# Patient Record
Sex: Male | Born: 1965 | Race: White | Hispanic: No | Marital: Single | Smoking: Former smoker
Health system: Southern US, Community
[De-identification: ages and names within clinical notes are randomized; demographics above are authoritative.]

## PROBLEM LIST (undated history)

## (undated) ENCOUNTER — Emergency Department (HOSPITAL_COMMUNITY): Discharge status: Absent without leave | Payer: Medicare PPO

## (undated) DIAGNOSIS — E119 Type 2 diabetes mellitus without complications: Secondary | ICD-10-CM

## (undated) DIAGNOSIS — G4730 Sleep apnea, unspecified: Secondary | ICD-10-CM

## (undated) DIAGNOSIS — I4891 Unspecified atrial fibrillation: Secondary | ICD-10-CM

## (undated) DIAGNOSIS — N189 Chronic kidney disease, unspecified: Secondary | ICD-10-CM

## (undated) DIAGNOSIS — E785 Hyperlipidemia, unspecified: Secondary | ICD-10-CM

## (undated) DIAGNOSIS — N186 End stage renal disease: Secondary | ICD-10-CM

## (undated) DIAGNOSIS — I255 Ischemic cardiomyopathy: Secondary | ICD-10-CM

## (undated) DIAGNOSIS — I739 Peripheral vascular disease, unspecified: Secondary | ICD-10-CM

## (undated) DIAGNOSIS — I509 Heart failure, unspecified: Secondary | ICD-10-CM

## (undated) DIAGNOSIS — I2510 Atherosclerotic heart disease of native coronary artery without angina pectoris: Secondary | ICD-10-CM

## (undated) DIAGNOSIS — F419 Anxiety disorder, unspecified: Secondary | ICD-10-CM

## (undated) DIAGNOSIS — I10 Essential (primary) hypertension: Secondary | ICD-10-CM

## (undated) DIAGNOSIS — I499 Cardiac arrhythmia, unspecified: Secondary | ICD-10-CM

## (undated) HISTORY — DX: Anxiety disorder, unspecified: F41.9

## (undated) HISTORY — DX: Cardiac arrhythmia, unspecified: I49.9

## (undated) HISTORY — DX: Hyperlipidemia, unspecified: E78.5

## (undated) HISTORY — DX: Type 2 diabetes mellitus without complications: E11.9

## (undated) HISTORY — DX: Essential (primary) hypertension: I10

## (undated) HISTORY — DX: Chronic kidney disease, unspecified: N18.9

## (undated) HISTORY — DX: Heart failure, unspecified: I50.9

## (undated) HISTORY — PX: AMPUTATION TOE: SHX6595

## (undated) HISTORY — DX: Atherosclerotic heart disease of native coronary artery without angina pectoris: I25.10

---

## 2004-02-21 ENCOUNTER — Other Ambulatory Visit: Payer: Self-pay

## 2005-09-28 ENCOUNTER — Other Ambulatory Visit: Payer: Self-pay

## 2005-09-28 ENCOUNTER — Emergency Department: Payer: Self-pay | Admitting: General Practice

## 2005-11-20 ENCOUNTER — Encounter: Payer: Self-pay | Admitting: Orthopedic Surgery

## 2005-12-20 ENCOUNTER — Encounter: Payer: Self-pay | Admitting: Orthopedic Surgery

## 2006-12-23 ENCOUNTER — Emergency Department: Payer: Self-pay | Admitting: General Practice

## 2007-03-22 ENCOUNTER — Emergency Department: Payer: Self-pay | Admitting: Emergency Medicine

## 2007-12-01 ENCOUNTER — Observation Stay: Payer: Self-pay | Admitting: Internal Medicine

## 2007-12-01 ENCOUNTER — Other Ambulatory Visit: Payer: Self-pay

## 2008-04-19 ENCOUNTER — Ambulatory Visit: Payer: Self-pay | Admitting: Urology

## 2009-03-10 ENCOUNTER — Other Ambulatory Visit: Payer: Self-pay | Admitting: Emergency Medicine

## 2010-07-22 HISTORY — PX: CARDIAC CATHETERIZATION: SHX172

## 2011-11-07 DIAGNOSIS — I517 Cardiomegaly: Secondary | ICD-10-CM | POA: Insufficient documentation

## 2011-11-07 DIAGNOSIS — E119 Type 2 diabetes mellitus without complications: Secondary | ICD-10-CM | POA: Insufficient documentation

## 2011-11-07 DIAGNOSIS — Z955 Presence of coronary angioplasty implant and graft: Secondary | ICD-10-CM | POA: Insufficient documentation

## 2011-11-07 DIAGNOSIS — N186 End stage renal disease: Secondary | ICD-10-CM | POA: Insufficient documentation

## 2011-11-07 DIAGNOSIS — I10 Essential (primary) hypertension: Secondary | ICD-10-CM | POA: Insufficient documentation

## 2011-11-07 DIAGNOSIS — I2510 Atherosclerotic heart disease of native coronary artery without angina pectoris: Secondary | ICD-10-CM | POA: Insufficient documentation

## 2011-11-07 DIAGNOSIS — E785 Hyperlipidemia, unspecified: Secondary | ICD-10-CM | POA: Insufficient documentation

## 2012-01-03 DIAGNOSIS — L299 Pruritus, unspecified: Secondary | ICD-10-CM | POA: Insufficient documentation

## 2012-01-03 DIAGNOSIS — Q245 Malformation of coronary vessels: Secondary | ICD-10-CM | POA: Insufficient documentation

## 2012-01-03 DIAGNOSIS — D696 Thrombocytopenia, unspecified: Secondary | ICD-10-CM | POA: Insufficient documentation

## 2012-01-03 DIAGNOSIS — N2581 Secondary hyperparathyroidism of renal origin: Secondary | ICD-10-CM | POA: Insufficient documentation

## 2012-01-03 DIAGNOSIS — E119 Type 2 diabetes mellitus without complications: Secondary | ICD-10-CM | POA: Insufficient documentation

## 2012-01-03 DIAGNOSIS — R52 Pain, unspecified: Secondary | ICD-10-CM | POA: Insufficient documentation

## 2012-01-03 DIAGNOSIS — D689 Coagulation defect, unspecified: Secondary | ICD-10-CM | POA: Insufficient documentation

## 2012-01-03 DIAGNOSIS — N189 Chronic kidney disease, unspecified: Secondary | ICD-10-CM | POA: Insufficient documentation

## 2012-01-03 DIAGNOSIS — R079 Chest pain, unspecified: Secondary | ICD-10-CM | POA: Insufficient documentation

## 2012-01-09 DIAGNOSIS — D509 Iron deficiency anemia, unspecified: Secondary | ICD-10-CM | POA: Insufficient documentation

## 2012-01-09 DIAGNOSIS — G479 Sleep disorder, unspecified: Secondary | ICD-10-CM | POA: Insufficient documentation

## 2012-01-09 DIAGNOSIS — E8809 Other disorders of plasma-protein metabolism, not elsewhere classified: Secondary | ICD-10-CM | POA: Insufficient documentation

## 2012-01-09 DIAGNOSIS — Z23 Encounter for immunization: Secondary | ICD-10-CM | POA: Insufficient documentation

## 2012-01-09 DIAGNOSIS — E875 Hyperkalemia: Secondary | ICD-10-CM | POA: Insufficient documentation

## 2013-09-21 DIAGNOSIS — E440 Moderate protein-calorie malnutrition: Secondary | ICD-10-CM | POA: Insufficient documentation

## 2014-06-25 DIAGNOSIS — I209 Angina pectoris, unspecified: Secondary | ICD-10-CM | POA: Insufficient documentation

## 2014-08-15 DIAGNOSIS — E8770 Fluid overload, unspecified: Secondary | ICD-10-CM | POA: Insufficient documentation

## 2014-10-17 ENCOUNTER — Other Ambulatory Visit (HOSPITAL_COMMUNITY): Payer: Self-pay | Admitting: Radiology

## 2014-10-17 DIAGNOSIS — G4730 Sleep apnea, unspecified: Secondary | ICD-10-CM

## 2014-10-17 DIAGNOSIS — R0683 Snoring: Secondary | ICD-10-CM

## 2015-03-22 ENCOUNTER — Other Ambulatory Visit (HOSPITAL_COMMUNITY): Payer: Self-pay | Admitting: Respiratory Therapy

## 2015-04-07 ENCOUNTER — Other Ambulatory Visit (HOSPITAL_COMMUNITY): Payer: Self-pay | Admitting: Respiratory Therapy

## 2015-06-20 ENCOUNTER — Ambulatory Visit: Payer: Medicare Other | Attending: Physician Assistant | Admitting: Sleep Medicine

## 2015-06-20 DIAGNOSIS — G4730 Sleep apnea, unspecified: Secondary | ICD-10-CM | POA: Diagnosis present

## 2015-06-20 DIAGNOSIS — G4733 Obstructive sleep apnea (adult) (pediatric): Secondary | ICD-10-CM | POA: Insufficient documentation

## 2015-06-20 DIAGNOSIS — R0683 Snoring: Secondary | ICD-10-CM

## 2015-06-24 ENCOUNTER — Encounter: Payer: Self-pay | Admitting: Neurology

## 2015-06-24 NOTE — Sleep Study (Signed)
  Moultrie A. Merlene Laughter, MD     [email or web address]             NOCTURNAL POLYSOMNOGRAPHY   LOCATION: [facility]   CLINICAL INFORMATION Sleep Study Type: Split Night CPAP Indication for sleep study: Snoring, Witnessed Apneas Epworth Sleepiness Score: 6 SLEEP STUDY TECHNIQUE As per the AASM Manual for the Scoring of Sleep and Associated Events v2.3 (April 2016) with a hypopnea requiring 4% desaturations. The channels recorded and monitored were frontal, central and occipital EEG, electrooculogram (EOG), submentalis EMG (chin), nasal and oral airflow, thoracic and abdominal wall motion, anterior tibialis EMG, snore microphone, electrocardiogram, and pulse oximetry. Continuous positive airway pressure (CPAP) was initiated when the patient met split night criteria and was titrated according to treat sleep-disordered breathing. MEDICATIONS Medications taken by the patient : N/A Medications administered by patient during sleep study : No sleep medicine administered.  Prior to Admission medications   Not on File    RESPIRATORY PARAMETERS Diagnostic Total AHI (/hr): 35.5 RDI (/hr): 35.5 OA Index (/hr): 4.4 CA Index (/hr): 0.5 REM AHI (/hr): N/A NREM AHI (/hr): 35.5 Supine AHI (/hr): 105.6 Non-supine AHI (/hr): 0.00 Min O2 Sat (%): 81.00 Mean O2 (%): 88.88 Time below 88% (min): 42.5     Titration Optimal Pressure (cm): 10 AHI at Optimal Pressure (/hr): 1.7 Min O2 at Optimal Pressure (%): 38.00 Supine % at Optimal (%): N/A Sleep % at Optimal (%): N/A     SLEEP ARCHITECTURE The recording time for the entire night was 429.9 minutes. During a baseline period of 145.6 minutes, the patient slept for 123.5 minutes in REM and nonREM, yielding a sleep efficiency of 84.8%. Sleep onset after lights out was 14.3 minutes with a REM latency of N/A minutes. The patient spent 6.88% of the night in stage N1 sleep, 63.97% in stage N2 sleep, 29.15% in stage N3 and 0.00% in REM. During the  titration period of 271.0 minutes, the patient slept for 166.0 minutes in REM and nonREM, yielding a sleep efficiency of 61.3%. Sleep onset after CPAP initiation was 30.4 minutes with a REM latency of 169.5 minutes. The patient spent 9.33% of the night in stage N1 sleep, 51.52% in stage N2 sleep, 27.70% in stage N3 and 11.44% in REM. CARDIAC DATA The 2 lead EKG demonstrated sinus rhythm. The mean heart rate was 73.74 beats per minute. Other EKG findings include: None. LEG MOVEMENT DATA The total Periodic Limb Movements of Sleep (PLMS) were 42. The PLMS index was 8.70.   IMPRESSIONS - Moderate to severe obstructive sleep apnea occurred during the diagnostic portion of the study (AHI = 35.5/hour). An optimal CPAP pressure 10. - Mild periodic limb movements of sleep occurred during the study.  Ralph Metz, MD Diplomate, American Board of Sleep Medicine.

## 2016-03-19 ENCOUNTER — Ambulatory Visit (INDEPENDENT_AMBULATORY_CARE_PROVIDER_SITE_OTHER): Payer: Medicare Other | Admitting: Podiatry

## 2016-03-19 ENCOUNTER — Encounter: Payer: Self-pay | Admitting: Podiatry

## 2016-03-19 ENCOUNTER — Ambulatory Visit (INDEPENDENT_AMBULATORY_CARE_PROVIDER_SITE_OTHER): Payer: Medicare Other

## 2016-03-19 ENCOUNTER — Telehealth: Payer: Self-pay | Admitting: *Deleted

## 2016-03-19 DIAGNOSIS — R7309 Other abnormal glucose: Secondary | ICD-10-CM | POA: Diagnosis not present

## 2016-03-19 DIAGNOSIS — R52 Pain, unspecified: Secondary | ICD-10-CM

## 2016-03-19 DIAGNOSIS — I96 Gangrene, not elsewhere classified: Secondary | ICD-10-CM | POA: Diagnosis not present

## 2016-03-19 DIAGNOSIS — L03115 Cellulitis of right lower limb: Secondary | ICD-10-CM

## 2016-03-19 MED ORDER — CLINDAMYCIN HCL 300 MG PO CAPS
300.0000 mg | ORAL_CAPSULE | Freq: Three times a day (TID) | ORAL | 2 refills | Status: DC
Start: 2016-03-19 — End: 2016-04-26

## 2016-03-19 MED ORDER — CIPROFLOXACIN HCL 500 MG PO TABS
500.0000 mg | ORAL_TABLET | Freq: Two times a day (BID) | ORAL | 0 refills | Status: DC
Start: 2016-03-19 — End: 2016-04-01

## 2016-03-19 NOTE — Patient Instructions (Signed)

## 2016-03-19 NOTE — Telephone Encounter (Addendum)
-----   Message from Trula Slade, DPM sent at 03/19/2016 12:13 PM EDT ----- Can he get arterial studies this week. 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  Orders faxed [facility] for Stat arterial dopplers. 03/20/2016-Debra - [facility] states pt is scheduled for 03/21/2016 at 0800, to arrive 0745, but can't contact pt. I spoke with pt he states he is at dialysis and keeps the volume down, but saw the message and called and confirmed he would make the appt.

## 2016-03-19 NOTE — Progress Notes (Signed)
Subjective:    Patient ID: Ralph Peterson, male    DOB: [birth year], 50 y.o.   MRN: [id number]  HPI   50 year old male presents the office today for concerns of a possible "diabetic ulcer" on his right third toe which is been ongoing for the last 3 weeks. He states that he started noticing redness to the tip of his toe and aligned 4 minus toe and over the last week or so the toe started to become black and discolored. He has noticed a small amount of redness to the right foot as well. He has not had any treatment for this. He is not currently on antibiotics. He is current dialysis 3 times a week and he is diabetic but he states he does not take medication for this. His last A1c was 6.8. He denies any claudication symptoms. Denies any pain at this time. He denies any systemic complaints as fevers, chills, nausea, vomiting. No calf pain, chest pain, shortness of breath.   Review of Systems  All other systems reviewed and are negative.      Objective:   Physical Exam General: AAO x3, NAD  Dermatological: There is gangrenous changes of the distal aspect of the right third toe as well as a small amount of macerated tissue just proximal to this on the PIPJ. There is erythema to the toe to the level of the MPJ as well. There is faint erythema to the dorsal aspect of the foot and a mild increase in warmth on the MPJ of the second however there is no ascending synovitis. I'm unable to express any drainage or pus today. There is no areas of fluctuance or crepitus. There is a mild malodor to the toe. There is no other open lesions. There are small hyperkeratotic lesions to the distal aspect of the second and fourth toe on the right foot.  Vascular: There appears to be decreased pulses on the right side of both the DP and PT compared to the left. However there is mild edema to the right foot which may limit evaluation. There is no pain with calf compression, swelling, warmth, erythema.   Neruologic:  Decreased sensation with Derrel Nip monofilament  Musculoskeletal: Hammertoes are present. There is no areas of tenderness bilateral.  Gait: Unassisted, Nonantalgic.      Assessment & Plan:  Gangrene right third digit, cellulitis -Treatment options discussed including all alternatives, risks, and complications -Etiology of symptoms were discussed -X-rays were obtained and reviewed with the patient. No definitive evidence of acute ulcer myelitis and there is no soft tissue emphysema. -At this time given the nature of the toe I recommended toe amputation to remove the gangrenous toe as well as to control infection. I discussed with him the potential that he may require further surgery further amputation but we will start with a toe amputation. He wishes to go ahead and proceed with this. He would like to hold off until next week however. I am going to start him on clindamycin and ciprofloxacin. Discussed with him that if he has any signs or symptoms of any worsening infection is to go immediately to the emergency room and he verbally understood this. Will order arterial studies as well. Ordered blood work today. -The incision placement as well as the postoperative course was discussed with the patient. I discussed risks of the surgery which include, but not limited to, infection, bleeding, pain, swelling, need for further surgery, delayed or nonhealing, painful or ugly scar, numbness or sensation changes,  over/under correction, recurrence, transfer lesions, further deformity, hardware failure, DVT/PE, loss of toe/foot. Patient understands these risks and wishes to proceed with surgery. The surgical consent was reviewed with the patient all 3 pages were signed. No promises or guarantees were given to the outcome of the procedure. All questions were answered to the best of my ability. Before the surgery the patient was encouraged to call the office if there is any further questions. The surgery will be  performed at the [facility] on an outpatient basis. Discussing to myself or Dr. Amalia Hailey will be performing the surgery. He agrees to this.  Celesta Gentile, DPM

## 2016-03-20 ENCOUNTER — Telehealth: Payer: Self-pay | Admitting: *Deleted

## 2016-03-20 DIAGNOSIS — I96 Gangrene, not elsewhere classified: Secondary | ICD-10-CM | POA: Insufficient documentation

## 2016-03-20 LAB — CBC WITH DIFFERENTIAL/PLATELET
BASOS ABS: 0.1 10*3/uL (ref 0.0–0.2)
BASOS: 1 %
EOS (ABSOLUTE): 0.2 10*3/uL (ref 0.0–0.4)
Eos: 2 %
HEMOGLOBIN: 9.9 g/dL — AB (ref 12.6–17.7)
Hematocrit: 29.8 % — ABNORMAL LOW (ref 37.5–51.0)
IMMATURE GRANS (ABS): 0.1 10*3/uL (ref 0.0–0.1)
Immature Granulocytes: 1 %
LYMPHS ABS: 2 10*3/uL (ref 0.7–3.1)
LYMPHS: 18 %
MCH: 30.6 pg (ref 26.6–33.0)
MCHC: 33.2 g/dL (ref 31.5–35.7)
MCV: 92 fL (ref 79–97)
MONOCYTES: 6 %
Monocytes Absolute: 0.7 10*3/uL (ref 0.1–0.9)
NEUTROS ABS: 8 10*3/uL — AB (ref 1.4–7.0)
Neutrophils: 72 %
Platelets: 257 10*3/uL (ref 150–379)
RBC: 3.24 x10E6/uL — ABNORMAL LOW (ref 4.14–5.80)
RDW: 13.9 % (ref 12.3–15.4)
WBC: 11 10*3/uL — ABNORMAL HIGH (ref 3.4–10.8)

## 2016-03-20 LAB — COMPREHENSIVE METABOLIC PANEL
A/G RATIO: 1.5 (ref 1.2–2.2)
ALBUMIN: 4.1 g/dL (ref 3.5–5.5)
ALK PHOS: 115 IU/L (ref 39–117)
ALT: 5 IU/L (ref 0–44)
AST: 7 IU/L (ref 0–40)
BILIRUBIN TOTAL: 0.4 mg/dL (ref 0.0–1.2)
BUN / CREAT RATIO: 4 — AB (ref 9–20)
BUN: 33 mg/dL — ABNORMAL HIGH (ref 6–24)
CHLORIDE: 91 mmol/L — AB (ref 96–106)
CO2: 29 mmol/L (ref 18–29)
Calcium: 10.1 mg/dL (ref 8.7–10.2)
Creatinine, Ser: 8.35 mg/dL — ABNORMAL HIGH (ref 0.76–1.27)
GFR calc non Af Amer: 7 mL/min/{1.73_m2} — ABNORMAL LOW (ref >59)
GFR, EST AFRICAN AMERICAN: 8 mL/min/{1.73_m2} — AB (ref >59)
Globulin, Total: 2.7 g/dL (ref 1.5–4.5)
Glucose: 223 mg/dL — ABNORMAL HIGH (ref 65–99)
POTASSIUM: 4.5 mmol/L (ref 3.5–5.2)
SODIUM: 139 mmol/L (ref 134–144)
TOTAL PROTEIN: 6.8 g/dL (ref 6.0–8.5)

## 2016-03-20 LAB — C-REACTIVE PROTEIN: CRP: 108.2 mg/L — ABNORMAL HIGH (ref 0.0–4.9)

## 2016-03-20 LAB — HEMOGLOBIN A1C
Est. average glucose Bld gHb Est-mCnc: 194 mg/dL
Hgb A1c MFr Bld: 8.4 % — ABNORMAL HIGH (ref 4.8–5.6)

## 2016-03-20 LAB — SEDIMENTATION RATE: SED RATE: 85 mm/h — AB (ref 0–15)

## 2016-03-20 NOTE — Telephone Encounter (Signed)
I left patient a message that his appointment is on 03/28/2016.

## 2016-03-21 ENCOUNTER — Telehealth: Payer: Self-pay | Admitting: *Deleted

## 2016-03-21 NOTE — Telephone Encounter (Signed)
"  I was returning your call from earlier.  He said he was going to try and get me in on Tuesday and you said it was scheduled for the 7th which is Thursday.  I just want to make sure I have the right information.  Call when you get a chance to let me know if it is Tuesday or Thursday.  Thank you have a good day."  I'm returning your call.  You are scheduled for Thursday.  "That works out perfect because I have another procedure on Tuesday.  They're going to remove some plaque so I can heal properly.  What time will it be."   Someone from the surgical center will call with your arrival time.

## 2016-03-22 NOTE — Telephone Encounter (Addendum)
-----   Message from Trula Slade, DPM sent at 03/20/2016  7:57 PM EDT ----- Tivis Ringer- Can you send this guys lab work to his PCP  Dr. Amalia Hailey- this is lab work on the guy with the gangrenous toe if you do the surgery. 03/22/2016-[facility] [phone], fax [phone], pt sees Nurse Robbins. Faxed 03/19/2016 labs.

## 2016-03-26 ENCOUNTER — Other Ambulatory Visit: Payer: Self-pay | Admitting: Vascular Surgery

## 2016-03-26 ENCOUNTER — Encounter
Admission: RE | Disposition: A | Discharge status: Home / Self Care | Payer: Self-pay | Source: Ambulatory Visit | Attending: Vascular Surgery

## 2016-03-26 ENCOUNTER — Ambulatory Visit
Admission: RE | Admit: 2016-03-26 | Discharge: 2016-03-26 | Disposition: A | Discharge status: Home / Self Care | Payer: Medicare Other | Source: Ambulatory Visit | Attending: Vascular Surgery | Admitting: Vascular Surgery

## 2016-03-26 DIAGNOSIS — Z8249 Family history of ischemic heart disease and other diseases of the circulatory system: Secondary | ICD-10-CM | POA: Diagnosis not present

## 2016-03-26 DIAGNOSIS — Z880 Allergy status to penicillin: Secondary | ICD-10-CM | POA: Insufficient documentation

## 2016-03-26 DIAGNOSIS — Z87891 Personal history of nicotine dependence: Secondary | ICD-10-CM | POA: Diagnosis not present

## 2016-03-26 DIAGNOSIS — I2510 Atherosclerotic heart disease of native coronary artery without angina pectoris: Secondary | ICD-10-CM | POA: Insufficient documentation

## 2016-03-26 DIAGNOSIS — L97514 Non-pressure chronic ulcer of other part of right foot with necrosis of bone: Secondary | ICD-10-CM | POA: Insufficient documentation

## 2016-03-26 DIAGNOSIS — I129 Hypertensive chronic kidney disease with stage 1 through stage 4 chronic kidney disease, or unspecified chronic kidney disease: Secondary | ICD-10-CM | POA: Insufficient documentation

## 2016-03-26 DIAGNOSIS — I70268 Atherosclerosis of native arteries of extremities with gangrene, other extremity: Secondary | ICD-10-CM | POA: Diagnosis present

## 2016-03-26 DIAGNOSIS — N189 Chronic kidney disease, unspecified: Secondary | ICD-10-CM | POA: Diagnosis not present

## 2016-03-26 DIAGNOSIS — E1122 Type 2 diabetes mellitus with diabetic chronic kidney disease: Secondary | ICD-10-CM | POA: Insufficient documentation

## 2016-03-26 DIAGNOSIS — Z833 Family history of diabetes mellitus: Secondary | ICD-10-CM | POA: Diagnosis not present

## 2016-03-26 HISTORY — PX: PERIPHERAL VASCULAR CATHETERIZATION: SHX172C

## 2016-03-26 LAB — POTASSIUM (ARMC VASCULAR LAB ONLY): Potassium (ARMC vascular lab): 4 (ref 3.5–5.1)

## 2016-03-26 SURGERY — ABDOMINAL AORTOGRAM W/LOWER EXTREMITY
Anesthesia: IV Sedation (MBSC Only) | Laterality: Right

## 2016-03-26 SURGERY — LOWER EXTREMITY ANGIOGRAPHY
Anesthesia: Moderate Sedation | Laterality: Right

## 2016-03-26 MED ORDER — SODIUM CHLORIDE 0.9 % IV SOLN
INTRAVENOUS | Status: DC
  Administered 2016-03-26: 08:00:00 via INTRAVENOUS

## 2016-03-26 MED ORDER — DEXTROSE 5 % IV SOLN
INTRAVENOUS | Status: AC
  Filled 2016-03-26: qty 1.5

## 2016-03-26 MED ORDER — CLOPIDOGREL BISULFATE 75 MG PO TABS: 75.0000 mg | ORAL_TABLET | Freq: Every day | ORAL | 4 refills | Status: DC

## 2016-03-26 MED ORDER — FENTANYL CITRATE (PF) 100 MCG/2ML IJ SOLN
INTRAMUSCULAR | Status: DC | PRN
  Administered 2016-03-26 (×6): 50 ug via INTRAVENOUS

## 2016-03-26 MED ORDER — MIDAZOLAM HCL 2 MG/2ML IJ SOLN
INTRAMUSCULAR | Status: AC
  Filled 2016-03-26: qty 2

## 2016-03-26 MED ORDER — CLOPIDOGREL BISULFATE 75 MG PO TABS
ORAL_TABLET | ORAL | Status: DC
Start: 2016-03-26 — End: 2016-03-26
  Filled 2016-03-26: qty 4

## 2016-03-26 MED ORDER — SODIUM CHLORIDE FLUSH 0.9 % IV SOLN
INTRAVENOUS | Status: AC
  Filled 2016-03-26: qty 20

## 2016-03-26 MED ORDER — CLINDAMYCIN PHOSPHATE 300 MG/50ML IV SOLN
INTRAVENOUS | Status: AC
  Filled 2016-03-26: qty 50

## 2016-03-26 MED ORDER — HYDRALAZINE HCL 20 MG/ML IJ SOLN
INTRAMUSCULAR | Status: AC
  Filled 2016-03-26: qty 1

## 2016-03-26 MED ORDER — MIDAZOLAM HCL 2 MG/2ML IJ SOLN
INTRAMUSCULAR | Status: AC
  Filled 2016-03-26: qty 4

## 2016-03-26 MED ORDER — HEPARIN SODIUM (PORCINE) 1000 UNIT/ML IJ SOLN
INTRAMUSCULAR | Status: AC
  Filled 2016-03-26: qty 1

## 2016-03-26 MED ORDER — HEPARIN (PORCINE) IN NACL 2-0.9 UNIT/ML-% IJ SOLN
INTRAMUSCULAR | Status: AC
  Filled 2016-03-26: qty 1000

## 2016-03-26 MED ORDER — FENTANYL CITRATE (PF) 100 MCG/2ML IJ SOLN
INTRAMUSCULAR | Status: AC
  Filled 2016-03-26: qty 2

## 2016-03-26 MED ORDER — CLINDAMYCIN PHOSPHATE 300 MG/50ML IV SOLN
300.0000 mg | INTRAVENOUS | Status: AC
  Administered 2016-03-26: 300 mg via INTRAVENOUS

## 2016-03-26 MED ORDER — HYDRALAZINE HCL 20 MG/ML IJ SOLN
INTRAMUSCULAR | Status: DC | PRN
  Administered 2016-03-26: 10 mg via INTRAVENOUS

## 2016-03-26 MED ORDER — HEPARIN SODIUM (PORCINE) 1000 UNIT/ML IJ SOLN
INTRAMUSCULAR | Status: DC | PRN
  Administered 2016-03-26: 5000 [IU] via INTRAVENOUS

## 2016-03-26 MED ORDER — IOPAMIDOL (ISOVUE-300) INJECTION 61%
INTRAVENOUS | Status: DC | PRN
  Administered 2016-03-26: 85 mL via INTRA_ARTERIAL

## 2016-03-26 MED ORDER — CLOPIDOGREL BISULFATE 75 MG PO TABS
300.0000 mg | ORAL_TABLET | ORAL | Status: AC
  Administered 2016-03-26: 300 mg via ORAL

## 2016-03-26 MED ORDER — SODIUM CHLORIDE 0.9 % IV SOLN: INTRAVENOUS | Status: DC

## 2016-03-26 MED ORDER — LIDOCAINE HCL (PF) 1 % IJ SOLN
INTRAMUSCULAR | Status: AC
  Filled 2016-03-26: qty 30

## 2016-03-26 MED ORDER — MIDAZOLAM HCL 2 MG/2ML IJ SOLN
INTRAMUSCULAR | Status: DC | PRN
  Administered 2016-03-26 (×4): 1 mg via INTRAVENOUS
  Administered 2016-03-26: 2 mg via INTRAVENOUS
  Administered 2016-03-26 (×2): 1 mg via INTRAVENOUS

## 2016-03-26 SURGICAL SUPPLY — 29 items
BALLN LUTONIX 5X150X130 (BALLOONS) ×3
BALLN LUTONIX DCB 4X60X130 (BALLOONS) ×3
BALLN LUTONIX DCB 5X60X130 (BALLOONS) ×3
BALLN ULTRVRSE 3X100X130C (BALLOONS) ×3
BALLN ULTRVRSE 3X40X130C (BALLOONS) ×3
BALLN ULTRVRSE 4X250X130 (BALLOONS) ×3 IMPLANT
BALLOON LUTONIX 5X150X130 (BALLOONS) ×1 IMPLANT
BALLOON LUTONIX DCB 4X60X130 (BALLOONS) ×1 IMPLANT
BALLOON LUTONIX DCB 5X60X130 (BALLOONS) ×1 IMPLANT
BALLOON ULTRVRSE 3X100X130C (BALLOONS) ×1 IMPLANT
BALLOON ULTRVRSE 3X40X130C (BALLOONS) ×1 IMPLANT
CATH CROSSER 14S OTW 146CM (CATHETERS) ×3 IMPLANT
CATH PIG 70CM (CATHETERS) ×3 IMPLANT
CATH SIDEKICK XL ST 110CM (SHEATH) ×3 IMPLANT
DEVICE PRESTO INFLATION (MISCELLANEOUS) ×3 IMPLANT
DEVICE STARCLOSE SE CLOSURE (Vascular Products) ×3 IMPLANT
DEVICE TORQUE (MISCELLANEOUS) ×3 IMPLANT
GLIDECATH ANGLED 4FR 120CM (CATHETERS) ×3 IMPLANT
GLIDEWIRE ADV .035X260CM (WIRE) ×3 IMPLANT
GLIDEWIRE ANGLED SS 035X260CM (WIRE) ×3 IMPLANT
KIT FLOWMATE PROCEDURAL (MISCELLANEOUS) ×3 IMPLANT
PACK ANGIOGRAPHY (CUSTOM PROCEDURE TRAY) ×3 IMPLANT
SET INTRO CAPELLA COAXIAL (SET/KITS/TRAYS/PACK) ×3 IMPLANT
SHEATH BRITE TIP 5FRX11 (SHEATH) ×3 IMPLANT
SHEATH RAABE 7FR (SHEATH) ×3 IMPLANT
SYR MEDRAD MARK V 150ML (SYRINGE) ×3 IMPLANT
TUBING CONTRAST HIGH PRESS 72 (TUBING) ×3 IMPLANT
WIRE G V18X300CM (WIRE) ×3 IMPLANT
WIRE J 3MM .035X145CM (WIRE) ×3 IMPLANT

## 2016-03-26 NOTE — H&P (Signed)
  [facility] History & Physical Update  The patient was interviewed and re-examined.  The patient's previous History and Physical has been reviewed and is unchanged.  There is no change in the plan of care. We plan to proceed with the scheduled procedure.  Schnier, Dolores Lory, MD  03/26/2016, 8:11 AM

## 2016-03-26 NOTE — OR Nursing (Signed)
Pre procedure, Dr Delana Meyer notified of BBB noted on EKG, elevated BP, Pt reported holding Coreg today per Jocelyn Lamer RN, Hydrazaline ordered and administered.

## 2016-03-26 NOTE — Op Note (Signed)
[facility] Percutaneous Study/Intervention Procedural Note   Date of Surgery: 03/26/2016  Surgeon:  Katha Cabal, MD.  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 Introduction catheter into right lower extremity 3rd order catheter placement  2. Contrast injection right lower extremity for distal runoff   3. Crosser atherectomy of the right SFA and popliteal arteries 4.  Percutaneous transluminal angioplasty with Lutonix right superficial femoral artery and popliteal             5.   Percutaneous transluminal angioplasty with Lutonix right peroneal artery                              6.   Star close closure left common femoral arteriotomy  Anesthesia: Conscious sedation was administered under my direct supervision. IV Versed plus fentanyl were utilized. Continuous ECG, pulse oximetry and blood pressure was monitored throughout the entire procedure.  Conscious sedation was for a total of 1 hour 30 minutes.  Sheath: 7 French Rabi  Contrast: 85 cc  Fluoroscopy Time: 16.1 minutes  Indications: Ralph Peterson presents with gangrene of the right forefoot The risks and benefits are reviewed all questions answered patient agrees to proceed.  Procedure: Ralph Peterson is a 49 y.o. y.o. male who was identified and appropriate procedural time out was performed. The patient was then placed supine on the table and prepped and draped in the usual sterile fashion.   Ultrasound was placed in the sterile sleeve and the left groin was evaluated the left common femoral artery was echolucent and pulsatile indicating patency.  Image was recorded for the permanent record and under real-time visualization a microneedle was inserted into the common femoral artery microwire followed by  a micro-sheath.  A J-wire was then advanced through the micro-sheath and a  5 [place] sheath was then inserted over a J-wire. J-wire was then advanced and a 5 French pigtail catheter was positioned at the level of T12. AP projection of the aorta was then obtained. Pigtail catheter was repositioned to above the bifurcation and a LAO view of the pelvis was obtained.  Subsequently a pigtail catheter with the stiff angle Glidewire was used to cross the aortic bifurcation the catheter wire were advanced down into the right distal external iliac artery. Oblique view of the femoral bifurcation was then obtained and subsequently the wire was reintroduced and the pigtail catheter negotiated into the SFA representing third order catheter placement. Distal runoff was then performed.  5000 units of heparin was then given and allowed to circulate and a 7 [place] rabi sheath was advanced up and over the bifurcation and positioned in the femoral artery  The 14s Crosser catheter was then prepped on the field and a straight micro-catheter was advanced into the cul-de-sac of the SFA under magnified imaging in the LAO projection. Using the Crosser catheter the occlusion of the SFA and popliteal was negotiated.  The 4 French angled glide catheter and stiff angle Glidewire were then advanced down into the distal popliteal.  Distal runoff was then completed by hand injection through the catheter.s negotiated and hand injection of contrast was used to verify intraluminal placement distally.    A series of balloons were used to angioplasty the superficial femoral and popliteal arteries. Beginning with a 3 mm balloon then utilizing a 4 mm balloon and finally 5 mm Lutonix  balloons Inflations were to 12-14 atmospheres for 2 minutes. Follow-up imaging demonstrated patency with adequate preparation of the vessel for a drug-coated balloon.  Subsequently, a 5 x 15 and a 5 x 6 Lutonix balloon was utilized inflating to 12 atm for 2 full  minutes. Follow-up imaging demonstrated an excellent result with less than 10% residual stenosis.  Distal runoff was then reassessed.  The wire was then negotiated into the distal peroneal and initially a 3 mm inflation was performed in the proximal peroneal and subsequently a 4 mm x 4 cm Lutonix balloon which was inflated to 4 atm for a low pressure inflation. Follow-up imaging demonstrated the peroneal is now widely patent no evidence of dissection with less than 5% residual stenosis. There is now a rapid flow of contrast to the foot.  After review of these images the sheath is pulled into the left external iliac oblique of the common femoral is obtained and a Star close device deployed. There no immediate Complications.  Findings: The abdominal aorta is opacified with a bolus injection contrast. Renal arteries are nonvisualized consistent with his end-stage renal disease. The aorta itself has diffuse disease but no hemodynamically significant lesions. The common and external iliac arteries are widely patent bilaterally.  The right common femoral is widely patent as is the profunda femoris.  The SFA does indeed have a significant stenosis which becomes a complete occlusion behind the knee.  The distal popliteal demonstrates minimal disease and the trifurcation is heavily diseased with occlusion of the anterior and posterior tibial arteries. The peroneal demonstrates a subtotal occlusion in its proximal portion.   Following angioplasty peroneal is now widely patent and there is in-line flow and looks quite nice. Angioplasty of the SFA at Hunter's canal yields an excellent result with less than 10% residual stenosis.    Disposition: Patient was taken to the recovery room in stable condition having tolerated the procedure well.  Ralph Peterson, Ralph Peterson 03/26/2016,10:00 AM

## 2016-03-26 NOTE — Discharge Instructions (Signed)

## 2016-03-27 ENCOUNTER — Encounter: Payer: Self-pay | Admitting: Vascular Surgery

## 2016-03-27 ENCOUNTER — Telehealth: Payer: Self-pay

## 2016-03-27 NOTE — Telephone Encounter (Signed)
Lvm in response to pt question in regards to continuing Plavix before procedure. Informed him  Per Dr Amalia Hailey, he can continue or start medication without any issues. There is very minimal bleeding for this type of sx

## 2016-03-28 ENCOUNTER — Encounter: Payer: Self-pay | Admitting: Podiatry

## 2016-03-28 ENCOUNTER — Telehealth: Payer: Self-pay | Admitting: *Deleted

## 2016-03-28 DIAGNOSIS — L0889 Other specified local infections of the skin and subcutaneous tissue: Secondary | ICD-10-CM | POA: Diagnosis not present

## 2016-03-28 DIAGNOSIS — L03115 Cellulitis of right lower limb: Secondary | ICD-10-CM

## 2016-03-28 DIAGNOSIS — M86679 Other chronic osteomyelitis, unspecified ankle and foot: Secondary | ICD-10-CM | POA: Diagnosis not present

## 2016-03-28 NOTE — Telephone Encounter (Signed)
Patient states that he left the surgical center this morning and noticed that he didn't have his Rx for cipro. He was given the clindamycin. Question is, does he need both cipro and clindamycin or just the clindamycin? Please advise.

## 2016-03-31 NOTE — Telephone Encounter (Signed)
Yes please add cipro 500mg  BID. Dr. Amalia Hailey did his surgery.

## 2016-04-01 MED ORDER — CIPROFLOXACIN HCL 500 MG PO TABS: 500.0000 mg | ORAL_TABLET | Freq: Two times a day (BID) | ORAL | 0 refills | Status: DC

## 2016-04-03 NOTE — Telephone Encounter (Signed)
Ralph Peterson addressed patient's concerns.

## 2016-04-05 ENCOUNTER — Ambulatory Visit (INDEPENDENT_AMBULATORY_CARE_PROVIDER_SITE_OTHER): Payer: Medicare Other | Admitting: Podiatry

## 2016-04-05 ENCOUNTER — Encounter: Payer: Self-pay | Admitting: Podiatry

## 2016-04-05 ENCOUNTER — Ambulatory Visit (INDEPENDENT_AMBULATORY_CARE_PROVIDER_SITE_OTHER): Payer: Medicare Other

## 2016-04-05 DIAGNOSIS — Z9889 Other specified postprocedural states: Secondary | ICD-10-CM

## 2016-04-05 DIAGNOSIS — I96 Gangrene, not elsewhere classified: Secondary | ICD-10-CM

## 2016-04-07 NOTE — Progress Notes (Signed)
Subjective :  Patient presents 1 week post op 3rd toe amputation Right foot.   Objective :  Skin edges well coapted. Sutures intact. No sign of infection noted. Peri-incision is macerated.   Assessment :  1. One week post-op 3rd toe amputation right foot.   Plan of Care :  1. Patient evaluated.  2. Doing well. Packing removed. Instructed patient to paint daily with betadine. Finish Clindamycin Rx, however, no need for further antibiotics at this point. Continue NWB right forefoot with offloading darco shoe.  3. Culture results reviewed with patient.  4. Rx for medical supplies given.  5. Return to clinic in 2 weeks.

## 2016-04-12 ENCOUNTER — Encounter: Payer: Self-pay | Admitting: Podiatry

## 2016-04-12 ENCOUNTER — Ambulatory Visit (INDEPENDENT_AMBULATORY_CARE_PROVIDER_SITE_OTHER): Payer: Medicare Other | Admitting: Podiatry

## 2016-04-12 DIAGNOSIS — Z9889 Other specified postprocedural states: Secondary | ICD-10-CM

## 2016-04-12 DIAGNOSIS — E0843 Diabetes mellitus due to underlying condition with diabetic autonomic (poly)neuropathy: Secondary | ICD-10-CM

## 2016-04-14 NOTE — Progress Notes (Signed)
Subjective :  Patient presents 2 weeks post op 3rd toe amputation Right foot.   Objective :  Skin edges well coapted with exception of mild dehiscence to the plantar aspect of the incision site on the weightbearing surface.. Sutures intact. No sign of infection noted. Peri-incision is macerated and callused to the plantar aspect of the incision..   Assessment :  1. Two weeks post-op 3rd toe amputation right foot.   Plan of Care :  1. Patient evaluated.  2. Doing well. There are signs that the patient has been excessively walking on his foot and standing. The patient admits to long periods of standing and walking around his house. 3. Reinforced nonweight bearing status to the right forefoot. Patient commits to limiting his ambulation around the house. 4. Continue daily dressing changes. 5. Today sutures removed on the dorsal aspect of the incision site.  6. Patient may begin getting the amputation site wet, however he is to immediately dry it and re-dress it with daily dressing changes. 7. Patient is to return to clinic in 2 weeks.

## 2016-04-26 ENCOUNTER — Encounter: Payer: Self-pay | Admitting: Podiatry

## 2016-04-26 ENCOUNTER — Ambulatory Visit (INDEPENDENT_AMBULATORY_CARE_PROVIDER_SITE_OTHER): Payer: Medicare Other | Admitting: Podiatry

## 2016-04-26 DIAGNOSIS — I70235 Atherosclerosis of native arteries of right leg with ulceration of other part of foot: Secondary | ICD-10-CM

## 2016-04-26 DIAGNOSIS — E0843 Diabetes mellitus due to underlying condition with diabetic autonomic (poly)neuropathy: Secondary | ICD-10-CM

## 2016-04-26 DIAGNOSIS — Z9889 Other specified postprocedural states: Secondary | ICD-10-CM

## 2016-04-26 DIAGNOSIS — L97512 Non-pressure chronic ulcer of other part of right foot with fat layer exposed: Secondary | ICD-10-CM

## 2016-04-26 DIAGNOSIS — T8781 Dehiscence of amputation stump: Secondary | ICD-10-CM

## 2016-04-26 DIAGNOSIS — E08621 Diabetes mellitus due to underlying condition with foot ulcer: Secondary | ICD-10-CM

## 2016-04-26 DIAGNOSIS — L97509 Non-pressure chronic ulcer of other part of unspecified foot with unspecified severity: Secondary | ICD-10-CM

## 2016-04-27 NOTE — Progress Notes (Signed)
Subjective :  Patient presents 4 weeks post op 3rd toe amputation Right foot.  There is a new finding of the ulcer to the second digit of the right foot. Patient states that he ripped a scab off the ulcer and it began to bleed.  Objective :  Skin edges well coapted with exception of mild dehiscence to the plantar aspect of the incision site on the weightbearing surface. Wound dehiscence measures 1.0 cm in length by 0.7 cm in width by 0.5 cm in depth. Remaining Sutures intact. No sign of infection noted. Peri-incision is macerated and callused.  New finding of a ulceration noted to the second digit of the right foot. Wound measures 1.0 cm in length by 1.0 cm in width by 0.2 cm in depth. There is an overlying eschar. After removal of the eschar, the wound base is fibrotic. There is a minimal amount of serous drainage noted. Periwound integrity is intact.  Assessment :  1. Two weeks post-op 3rd toe amputation right foot.  2. Ulceration second digit right foot secondary to diabetes mellitus 3. Diabetes mellitus with peripheral neuropathy 4. 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 Patient evaluated.  2. Amputation site doing well. There is dehiscence with the wound noted to the plantar aspect of the incision site.  3. Remaining sutures removed today. 4. Medically necessary excisional debridement including subcutaneous tissue was performed of the noted ulcerations. Excisional debridement of all necrotic nonviable tissue down to healthy bleeding viable tissue was performed with post-debridement measurements and was pre-. 5. Today orders for home health wound dressing changes were given and arranged for every other day 6 weeks. 6. Order form for diabetic shoes was prescribed today. 7. Patient is to return to clinic in 3 weeks.

## 2016-04-29 ENCOUNTER — Telehealth: Payer: Self-pay | Admitting: *Deleted

## 2016-04-29 NOTE — Telephone Encounter (Addendum)
-----   Message from Ralph Peterson, DPM sent at 04/27/2016  8:37 AM EDT ----- Regarding: Home Health Wound Dressing Changes Please arrange home health wound dressing changes  - Every other day x 6 weeks  1. Cleanse right foot with normal saline or wound cleanser. Dry.  2. Apply Prisma/Collagen dressing to noted ulceration sites. 3. Paint periwound with normal saline.  4. Dress with 4x4 gauze, 3" kling, and ace wrap or coban.   Noted ulceration sites w/ measurements (L x W x D) :  1. Dehisced amputation site 3rd toe RT foot. 1.0cm x 0.7cm x 0.5cm 2. Ulcer 2nd digit RT foot, distal tuft plantarly. 1.0cm x 1.0cm x 0.2cm 3. Ulcer lateral aspect of 5th MPJ. 0.4cm x 0.4cm x 0.1cm. 4. Ulcer medial aspect of 1st MPJ. 1.3cm x 0.3cm x 0.1cm Faxed copy of referral, wound instructions, pt clinicals, and demographics with insurance cards to [facility].  Add'tl Dx :  1. DM w/ neuropathy 2. H/o amputation 3rd digit RT foot.  04/30/2016-[facility] - Benjamine Mola called concerning pt left no message. I left message for Benjamine Mola to call concerning pt - [name]. 05/01/2016-[name] - [facility] states pt is not homebound, Medicare will not cover home health care. Benjamine Mola states she did order pt's supplies from [facility], taught pt to change the dressing and he can verbalize the procedure, but she did not change the dressing. I informed Dr. Amalia Hailey and he said have pt come in qod until dressing supplies arrive for dressing changes on the nurses schedule, once the dressing supplies have arrived have pt make an appt to be instructed by the nurse on procedure with the supplies. Dr. Amalia Hailey states nurse can cleanse foot with normal saline or wound cleanse, paint right 3rd amputation site with betadine cover with telfa and sterile gauze and 2" Kling until dressing supplies arrive. Benjamine Mola states pt is also to take classes for home/self dialysis. Left message for pt to schedule an appt for 05/02/2016 on nurses  schedule.

## 2016-05-02 ENCOUNTER — Ambulatory Visit: Payer: Medicare Other

## 2016-05-08 NOTE — Progress Notes (Signed)
DOS 09.07.2017 Right 3rd Toe Amputation, Possible Partial 3rd Ray Amputation

## 2016-05-10 ENCOUNTER — Ambulatory Visit (INDEPENDENT_AMBULATORY_CARE_PROVIDER_SITE_OTHER): Payer: Medicare Other | Admitting: Podiatry

## 2016-05-10 DIAGNOSIS — I70235 Atherosclerosis of native arteries of right leg with ulceration of other part of foot: Secondary | ICD-10-CM | POA: Diagnosis not present

## 2016-05-10 DIAGNOSIS — L97512 Non-pressure chronic ulcer of other part of right foot with fat layer exposed: Secondary | ICD-10-CM | POA: Diagnosis not present

## 2016-05-10 DIAGNOSIS — E0843 Diabetes mellitus due to underlying condition with diabetic autonomic (poly)neuropathy: Secondary | ICD-10-CM

## 2016-05-10 DIAGNOSIS — T8781 Dehiscence of amputation stump: Secondary | ICD-10-CM

## 2016-05-10 DIAGNOSIS — Z9889 Other specified postprocedural states: Secondary | ICD-10-CM

## 2016-05-10 NOTE — Progress Notes (Signed)
Subjective :  Patient presents status post 3rd toe amputation Right foot. patient states that is feeling well. He believes there is significant improvement.    Objective :  Skin edges well coapted with exception of mild dehiscence to the plantar aspect of the incision site on the weightbearing surface. Wound dehiscence measures 0.5 in length by  0.5cm in width by 0.5 cm in depth.  No sign of infection noted. Minimal maceration noted. Ulceration noted to the second digit of the right foot. Wound measures 1.0 cm in length by 1.0 cm in width by 0.2 cm in depth. There is an overlying eschar. After removal of the eschar, the wound base is fibrotic. There is a minimal amount of serous drainage noted. Periwound integrity is intact.  Assessment :  1. Two weeks post-op 3rd toe amputation right foot.  2. Ulceration second digit right foot secondary to diabetes mellitus 3. Diabetes mellitus with peripheral neuropathy 4. 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 Patient evaluated.  2. Amputation site doing well. There is dehiscence with the wound noted to the plantar aspect of the incision site.  3. Medically necessary excisional debridement including subcutaneous tissue was performed of the noted ulcerations. Excisional debridement of all necrotic nonviable tissue down to healthy bleeding viable tissue was performed with post-debridement measurements and was pre-. 5. Patient is to slowly transition from postoperative shoe to shoe gear. Instructed patient to check feet daily 6. Continue home dressing changes 7. Patient is return to clinic in 3 weeks

## 2016-05-29 DIAGNOSIS — L03115 Cellulitis of right lower limb: Secondary | ICD-10-CM | POA: Insufficient documentation

## 2016-05-30 ENCOUNTER — Telehealth: Payer: Self-pay | Admitting: *Deleted

## 2016-05-30 NOTE — Telephone Encounter (Addendum)
Pt states had toe amputation 2 months ago, and had swelling in foot and ankle, yesterday dialysis performed blood cultures, gave 2500mg  Vancomycin, and 160mg  Gentamycin. Pt states he has not had pain and the dialysis staff said it was localized swelling. Pt states after the antibiotic it is less red, but he wanted to know if he needed an earlier appt than next Tuesday. I informed pt I would like to get him in to see Dr. Amalia Hailey tomorrow, to make sure nothing was missed at dialysis. Pt agreed and I transferred pt to schedulers.

## 2016-05-31 ENCOUNTER — Ambulatory Visit (INDEPENDENT_AMBULATORY_CARE_PROVIDER_SITE_OTHER): Payer: Medicare Other | Admitting: Podiatry

## 2016-05-31 ENCOUNTER — Encounter: Payer: Self-pay | Admitting: Podiatry

## 2016-05-31 DIAGNOSIS — Z9889 Other specified postprocedural states: Secondary | ICD-10-CM

## 2016-05-31 DIAGNOSIS — I70235 Atherosclerosis of native arteries of right leg with ulceration of other part of foot: Secondary | ICD-10-CM

## 2016-05-31 DIAGNOSIS — T8781 Dehiscence of amputation stump: Secondary | ICD-10-CM

## 2016-05-31 DIAGNOSIS — L97512 Non-pressure chronic ulcer of other part of right foot with fat layer exposed: Secondary | ICD-10-CM

## 2016-05-31 DIAGNOSIS — E0843 Diabetes mellitus due to underlying condition with diabetic autonomic (poly)neuropathy: Secondary | ICD-10-CM

## 2016-05-31 NOTE — Telephone Encounter (Signed)
Yes I agree. Thanks Marcy Siren!

## 2016-06-03 ENCOUNTER — Telehealth: Payer: Self-pay | Admitting: *Deleted

## 2016-06-03 NOTE — Telephone Encounter (Signed)
Rozanna Box - [facility] states she needs a faxed rx with the Gentamycin dosing.

## 2016-06-03 NOTE — Telephone Encounter (Signed)
Rozanna Box - [facility] states they need a new order for Gentamycin. I spoke with Mardene Celeste and she states she called and the orders were clarified  For 600mg  Gentamycin IV.

## 2016-06-04 ENCOUNTER — Encounter: Payer: Medicare Other | Admitting: Podiatry

## 2016-06-11 NOTE — Progress Notes (Signed)
Subjective :  Patient presents status post 3rd toe amputation Right foot. Date of surgery 03/28/2016. Patient states that he went to dialysis this past Wednesday and he noted red cellulitis to the amputation stump. At that time dialysis gave him an intravenous dose of antibiotic. Patient presents today for further treatment and evaluation.  Objective :  Skin edges well coapted with exception of mild dehiscence to the plantar aspect of the incision site on the weightbearing surface. Wound dehiscence measures 0.5 in length by  0.5cm in width by 0.5 cm in depth.  No sign of infection noted. Minimal maceration noted. Ulceration noted to the second digit of the right foot. Wound measures 1.0 cm in length by 1.0 cm in width by 0.2 cm in depth. There is an overlying eschar. After removal of the eschar, the wound base is fibrotic. There is a minimal amount of serous drainage noted. Periwound integrity is intact. Mild cellulitis noted to the open ulceration site. Controlled.  Assessment :  1. 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  2. Ulceration second digit right foot secondary to diabetes mellitus 3. Diabetes mellitus with peripheral neuropathy 4. 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 Patient evaluated.  2. Amputation site doing well. There is dehiscence with the wound noted to the plantar aspect of the incision site.  3. Today a prescription was given for intravenous dosing at dialysis 2 times per week. Antibiotic therapy regimen consists of 1 g vancomycin with 160 g gentamicin IV 2 weeks. Return to clinic in 2 weeks

## 2016-06-18 ENCOUNTER — Encounter: Payer: Self-pay | Admitting: Podiatry

## 2016-06-18 ENCOUNTER — Ambulatory Visit (INDEPENDENT_AMBULATORY_CARE_PROVIDER_SITE_OTHER): Payer: Medicare Other

## 2016-06-18 ENCOUNTER — Ambulatory Visit (INDEPENDENT_AMBULATORY_CARE_PROVIDER_SITE_OTHER): Payer: Medicare Other | Admitting: Podiatry

## 2016-06-18 ENCOUNTER — Ambulatory Visit: Payer: Medicare Other

## 2016-06-18 DIAGNOSIS — Z9889 Other specified postprocedural states: Secondary | ICD-10-CM

## 2016-06-18 DIAGNOSIS — M86679 Other chronic osteomyelitis, unspecified ankle and foot: Secondary | ICD-10-CM | POA: Diagnosis not present

## 2016-06-18 DIAGNOSIS — L97509 Non-pressure chronic ulcer of other part of unspecified foot with unspecified severity: Secondary | ICD-10-CM

## 2016-06-18 DIAGNOSIS — I70235 Atherosclerosis of native arteries of right leg with ulceration of other part of foot: Secondary | ICD-10-CM

## 2016-06-18 DIAGNOSIS — E0843 Diabetes mellitus due to underlying condition with diabetic autonomic (poly)neuropathy: Secondary | ICD-10-CM | POA: Diagnosis not present

## 2016-06-18 DIAGNOSIS — E08621 Diabetes mellitus due to underlying condition with foot ulcer: Secondary | ICD-10-CM

## 2016-06-18 DIAGNOSIS — E1143 Type 2 diabetes mellitus with diabetic autonomic (poly)neuropathy: Secondary | ICD-10-CM

## 2016-06-18 NOTE — Progress Notes (Signed)
Subjective:  Patient with a history of diabetes mellitus presents today for evaluation ulceration(s) to the lower extremitie(s). Patient states that he did have a history of diabetes mellitus. The patient is status post third toe amputation of the right foot. Presents to Patient presents today for further treatment and evaluation   Objective/Physical Exam General: The patient is alert and oriented x3 in no acute distress.  Dermatology:  Wound #1 noted to the lateral aspect of the right foot overlying the fifth MPJ measuring 0.3 cm 0.2 cm 0.2 cm (LxWxD).   All other ulcerations to the right foot have healed. Complete reepithelialization has occurred.  To the noted ulceration(s), there is no eschar. There is a moderate amount of slough, fibrin, and necrotic tissue noted. Granulation tissue and wound base is red. There is a minimal amount of serosanguineous drainage noted. There is no exposed bone muscle-tendon ligament or joint. There is no malodor. Periwound integrity is intact. Skin is warm, dry and supple bilateral lower extremities.  Vascular: Palpable pedal pulses bilaterally. No edema or erythema noted. Capillary refill within normal limits.  Neurological: Epicritic and protective threshold absent bilaterally.   Musculoskeletal Exam: Range of motion within normal limits to all pedal and ankle joints bilateral. Muscle strength 5/5 in all groups bilateral.   Assessment: #1 ulcer right lateral forefoot secondary to diabetes mellitus #2 diabetes mellitus w/ peripheral neuropathy   Plan of Care:  #1 Patient was evaluated. #2 medically necessary excisional debridement including subcutaneous tissue was performed using a tissue nipper and a chisel blade. Excisional debridement of all the necrotic nonviable tissue down to healthy bleeding viable tissue was performed with post-debridement measurements same as pre-. #3 the wound was cleansed and dry sterile dressing applied. #4 today wound  care dressings applied orders were placed. (Prisma and normal saline) #5 patient is to return to clinic in 2 weeks.  Provide patient with the status of his diabetic shoes on next visit   Dr. Edrick Kins, [place]

## 2016-06-19 NOTE — Telephone Encounter (Addendum)
-----   Message from Edrick Kins, DPM sent at 06/18/2016  1:56 PM EST ----- Regarding: Wound Dressing Supplies Please order Dressing Supplies for patient.  Dx : Diabetic foot ulcer right foot. History of toe amputation right foot secondary to diabetes mellitus.   - Prisma - Normal Saline.   30 day supply.   Thanks! 06/19/2016-DrAmalia Hailey ordered Prisma collagen dressing and normal saline quantity for 30 days for right diabetic foot ulcer at area of toe amputation 0.7 x 0.5 x 0.2cm with low exudate from [facility]. Orders faxed to [facility].

## 2016-08-02 ENCOUNTER — Ambulatory Visit: Payer: Medicare Other | Admitting: Podiatry

## 2016-08-06 ENCOUNTER — Ambulatory Visit (INDEPENDENT_AMBULATORY_CARE_PROVIDER_SITE_OTHER): Payer: Medicare Other | Admitting: Podiatry

## 2016-08-06 DIAGNOSIS — E1143 Type 2 diabetes mellitus with diabetic autonomic (poly)neuropathy: Secondary | ICD-10-CM

## 2016-08-06 DIAGNOSIS — L97512 Non-pressure chronic ulcer of other part of right foot with fat layer exposed: Secondary | ICD-10-CM

## 2016-08-06 DIAGNOSIS — E0843 Diabetes mellitus due to underlying condition with diabetic autonomic (poly)neuropathy: Secondary | ICD-10-CM

## 2016-08-06 DIAGNOSIS — I70235 Atherosclerosis of native arteries of right leg with ulceration of other part of foot: Secondary | ICD-10-CM | POA: Diagnosis not present

## 2016-08-06 DIAGNOSIS — L97509 Non-pressure chronic ulcer of other part of unspecified foot with unspecified severity: Secondary | ICD-10-CM

## 2016-08-06 DIAGNOSIS — E08621 Diabetes mellitus due to underlying condition with foot ulcer: Secondary | ICD-10-CM

## 2016-08-11 NOTE — Progress Notes (Signed)
   Subjective:  Patient with a history of diabetes mellitus presents today for evaluation ulceration(s) to the lower extremitie(s). Patient states that he did have a history of diabetes mellitus. Patient presents today for further treatment and evaluation   Objective/Physical Exam General: The patient is alert and oriented x3 in no acute distress.  Dermatology:  Wound #1 noted to the fifth MPJ right foot measuring [phone] cm (LxWxD).   Wound #2 located to the third digit amputation site is healed. Complete reepithelialization has occurred the amputation site.  To the noted ulceration(s) wound #1, there is no eschar. There is a moderate amount of slough, fibrin, and necrotic tissue noted. Granulation tissue and wound base is red. There is a minimal amount of serosanguineous drainage noted. There is no exposed bone muscle-tendon ligament or joint. There is no malodor. Periwound integrity is intact. Skin is warm, dry and supple bilateral lower extremities.  Vascular: Palpable pedal pulses bilaterally. No edema or erythema noted. Capillary refill within normal limits.  Neurological: Epicritic and protective threshold absent bilaterally.   Musculoskeletal Exam: Range of motion within normal limits to all pedal and ankle joints bilateral. Muscle strength 5/5 in all groups bilateral.   Assessment: #1 fifth MPJ ulceration right foot secondary to diabetes mellitus #2 diabetes mellitus w/ peripheral neuropathy   Plan of Care:  #1 Patient was evaluated. #2 medically necessary excisional debridement including subcutaneous tissue was performed using a tissue nipper and a chisel blade. Excisional debridement of all the necrotic nonviable tissue down to healthy bleeding viable tissue was performed with post-debridement measurements same as pre-. #3 the wound was cleansed and dry sterile dressing applied. #4 silicone toe caps were dispensed for bilateral second digit calluses. #5 authorization  for diabetic shoes was resected today to the primary care physician #6 return to clinic in 4 weeks. Today we did discuss the option of primary closure of the ulceration to the fifth MPJ. This could likely be done in the office setting.  Edrick Kins, DPM [facility]  Dr. Edrick Kins, [address]                                        [place], [place] [address]                Office [phone]  Fax [phone]

## 2016-09-06 ENCOUNTER — Ambulatory Visit: Payer: Medicare Other | Admitting: Podiatry

## 2016-09-10 ENCOUNTER — Encounter: Payer: Self-pay | Admitting: Podiatry

## 2016-09-10 ENCOUNTER — Ambulatory Visit (INDEPENDENT_AMBULATORY_CARE_PROVIDER_SITE_OTHER): Payer: Medicare Other | Admitting: Podiatry

## 2016-09-10 DIAGNOSIS — I70235 Atherosclerosis of native arteries of right leg with ulceration of other part of foot: Secondary | ICD-10-CM

## 2016-09-10 DIAGNOSIS — L97512 Non-pressure chronic ulcer of other part of right foot with fat layer exposed: Secondary | ICD-10-CM

## 2016-09-10 DIAGNOSIS — Z992 Dependence on renal dialysis: Secondary | ICD-10-CM | POA: Insufficient documentation

## 2016-09-10 MED ORDER — SULFAMETHOXAZOLE-TRIMETHOPRIM 800-160 MG PO TABS: 1.0000 | ORAL_TABLET | Freq: Two times a day (BID) | ORAL | 0 refills | Status: DC

## 2016-09-10 NOTE — Progress Notes (Signed)
   Subjective:  Patient presents today for follow-up evaluation of a fifth MPJ ulceration to the right foot secondary to diabetes mellitus. Patient has a history of a third digit amputation to the right foot. Amputation site is healed. Patient presents today with a new complaint of an ulceration to the second digit right foot. Patient states this is a ulcer that developed over the course the past few weeks. Patient denies trauma.   Objective/Physical Exam General: The patient is alert and oriented x3 in no acute distress.  Dermatology:  Wound #1 noted to the lateral aspect of the fifth MPJ right foot measuring [id number] cm (LxWxD).   Wound #2 noted to the distal tuft of the second digit right foot measuring 0.60.60.1cm (LxWxD).   To the noted ulceration(s), there is no eschar. There is a moderate amount of slough, fibrin, and necrotic tissue noted. Granulation tissue and wound base is red. There is a minimal amount of serosanguineous drainage noted. There is no exposed bone muscle-tendon ligament or joint. There is no malodor. Periwound integrity is intact. Skin is warm, dry and supple bilateral lower extremities.  Vascular: Palpable pedal pulses bilaterally. No edema or erythema noted. Capillary refill within normal limits.  Neurological: Epicritic and protective threshold absent bilaterally.   Musculoskeletal Exam: Range of motion within normal limits to all pedal and ankle joints bilateral. Muscle strength 5/5 in all groups bilateral.   Assessment: #1 right foot ulcerations secondary to diabetes mellitus #2 diabetes mellitus w/ peripheral neuropathy #3 cellulitis second digit right foot   Plan of Care:  #1 Patient was evaluated. #2 medically necessary excisional debridement including subcutaneous tissue was performed using a tissue nipper and a chisel blade. Excisional debridement of all the necrotic nonviable tissue down to healthy bleeding viable tissue was performed with  post-debridement measurements same as pre-. #3 the wound was cleansed and dry sterile dressing applied. #4 prescription for Bactrim DS #24 second digit cellulitis #5 toe crest pad dispensed today #6 patient is to return to clinic in 2 weeks.   Edrick Kins, DPM [facility]  Dr. Edrick Kins, [address]                                        [place], [place] [address]                Office [phone]  Fax [phone]

## 2016-09-24 ENCOUNTER — Encounter: Payer: Self-pay | Admitting: Podiatry

## 2016-09-24 ENCOUNTER — Ambulatory Visit (INDEPENDENT_AMBULATORY_CARE_PROVIDER_SITE_OTHER): Payer: Medicare Other | Admitting: Podiatry

## 2016-09-24 DIAGNOSIS — E08621 Diabetes mellitus due to underlying condition with foot ulcer: Secondary | ICD-10-CM

## 2016-09-24 DIAGNOSIS — E0843 Diabetes mellitus due to underlying condition with diabetic autonomic (poly)neuropathy: Secondary | ICD-10-CM | POA: Diagnosis not present

## 2016-09-24 DIAGNOSIS — E1143 Type 2 diabetes mellitus with diabetic autonomic (poly)neuropathy: Secondary | ICD-10-CM | POA: Diagnosis not present

## 2016-09-24 DIAGNOSIS — L97512 Non-pressure chronic ulcer of other part of right foot with fat layer exposed: Secondary | ICD-10-CM

## 2016-09-24 DIAGNOSIS — L97509 Non-pressure chronic ulcer of other part of unspecified foot with unspecified severity: Secondary | ICD-10-CM | POA: Diagnosis not present

## 2016-09-24 DIAGNOSIS — I70235 Atherosclerosis of native arteries of right leg with ulceration of other part of foot: Secondary | ICD-10-CM

## 2016-10-08 ENCOUNTER — Encounter: Payer: Self-pay | Admitting: Podiatry

## 2016-10-08 ENCOUNTER — Ambulatory Visit (INDEPENDENT_AMBULATORY_CARE_PROVIDER_SITE_OTHER): Payer: Medicare Other | Admitting: Podiatry

## 2016-10-08 DIAGNOSIS — E08621 Diabetes mellitus due to underlying condition with foot ulcer: Secondary | ICD-10-CM | POA: Diagnosis not present

## 2016-10-08 DIAGNOSIS — I70235 Atherosclerosis of native arteries of right leg with ulceration of other part of foot: Secondary | ICD-10-CM

## 2016-10-08 DIAGNOSIS — L97509 Non-pressure chronic ulcer of other part of unspecified foot with unspecified severity: Secondary | ICD-10-CM | POA: Diagnosis not present

## 2016-10-08 DIAGNOSIS — L97512 Non-pressure chronic ulcer of other part of right foot with fat layer exposed: Secondary | ICD-10-CM

## 2016-10-08 DIAGNOSIS — Z899 Acquired absence of limb, unspecified: Secondary | ICD-10-CM

## 2016-10-09 NOTE — Progress Notes (Signed)
   Subjective:  Patient presents today for follow-up evaluation of a fifth MPJ ulceration to the right foot secondary to diabetes mellitus. Patient also presents for follow-up evaluation of an ulceration to the second digit right foot. This ulcer developed over the course of the past several weeks.  Objective/Physical Exam General: The patient is alert and oriented x3 in no acute distress.  Dermatology:  Wound #1 noted to the lateral aspect of the fifth MPJ right foot measuring [phone] cm (LxWxD).   Wound #2 noted to the distal tuft of the second digit right foot measuring [phone] cm (LxWxD).   To the noted ulceration(s), there is no eschar. There is a moderate amount of slough, fibrin, and necrotic tissue noted. Granulation tissue and wound base is red. There is a minimal amount of serosanguineous drainage noted. There is no exposed bone muscle-tendon ligament or joint. There is no malodor. Periwound integrity is intact. Skin is warm, dry and supple bilateral lower extremities.  Vascular: Palpable pedal pulses bilaterally. No edema or erythema noted. Capillary refill within normal limits.  Neurological: Epicritic and protective threshold absent bilaterally.   Musculoskeletal Exam: Range of motion within normal limits to all pedal and ankle joints bilateral. Muscle strength 5/5 in all groups bilateral.   Assessment: #1 right foot ulcerations secondary to diabetes mellitus #2 diabetes mellitus w/ peripheral neuropathy #3 cellulitis second digit right foot   Plan of Care:  #1 Patient was evaluated. #2 medically necessary excisional debridement including subcutaneous tissue was performed using a tissue nipper and a chisel blade. Excisional debridement of all the necrotic nonviable tissue down to healthy bleeding viable tissue was performed with post-debridement measurements same as pre-. #3 the wound was cleansed and dry sterile dressing applied. #4 patient finished his antibiotic  treatment dispensed from last visit. Bactrim DS. #5 a day for diabetic shoes was provided today #6 return to clinic in 2 weeks   Edrick Kins, DPM [facility]  Dr. Edrick Kins, [address]                                        [place], [place] [address]                Office [phone]  Fax [phone]

## 2016-10-15 NOTE — Progress Notes (Signed)
   Subjective:  Patient presents today for follow-up evaluation of right foot ulcerations secondary to diabetes mellitus as well as cellulitis chronic to the second digit right foot. Patient states that he is doing much better. Patient believes the wounds are healing progressively, it is a cyst slow progression area  Objective/Physical Exam General: The patient is alert and oriented x3 in no acute distress.  Dermatology:  Wound #1 noted to the lateral aspect of the fifth MPJ right foot has healed. Complete reepithelialization has occurred.  Wound #2 noted to the distal tuft of the second digit right foot measuring [phone] cm (LxWxD).   To the noted ulceration(s), there is no eschar. There is a moderate amount of slough, fibrin, and necrotic tissue noted. Granulation tissue and wound base is red. There is a minimal amount of serosanguineous drainage noted. There is no exposed bone muscle-tendon ligament or joint. There is no malodor. Periwound integrity is intact. Skin is warm, dry and supple bilateral lower extremities.  Vascular: Palpable pedal pulses bilaterally. No edema or erythema noted. Capillary refill within normal limits.  Neurological: Epicritic and protective threshold absent bilaterally.   Musculoskeletal Exam: Range of motion within normal limits to all pedal and ankle joints bilateral. Muscle strength 5/5 in all groups bilateral.   Assessment: #1 right foot ulcerations secondary to diabetes mellitus #2 diabetes mellitus w/ peripheral neuropathy #3 cellulitis second digit right foot   Plan of Care:  #1 Patient was evaluated. #2 medically necessary excisional debridement including subcutaneous tissue was performed using a tissue nipper and a chisel blade. Excisional debridement of all the necrotic nonviable tissue down to healthy bleeding viable tissue was performed with post-debridement measurements same as pre-. #3 the wound was cleansed and dry sterile dressing  applied. #4 prescription for mupirocin cream  #5 return to clinic in 2 weeks  Update needed for diabetic shoes   Edrick Kins, DPM [facility]  Dr. Edrick Kins, [address]                                        [place], [place] [address]                Office [phone]  Fax [phone]

## 2016-10-17 ENCOUNTER — Ambulatory Visit: Payer: Self-pay | Admitting: Physician Assistant

## 2016-10-29 ENCOUNTER — Ambulatory Visit (INDEPENDENT_AMBULATORY_CARE_PROVIDER_SITE_OTHER): Payer: Medicare Other | Admitting: Podiatry

## 2016-10-29 DIAGNOSIS — E0843 Diabetes mellitus due to underlying condition with diabetic autonomic (poly)neuropathy: Secondary | ICD-10-CM

## 2016-10-29 DIAGNOSIS — E08621 Diabetes mellitus due to underlying condition with foot ulcer: Secondary | ICD-10-CM | POA: Diagnosis not present

## 2016-10-29 DIAGNOSIS — I70235 Atherosclerosis of native arteries of right leg with ulceration of other part of foot: Secondary | ICD-10-CM

## 2016-10-29 DIAGNOSIS — L97509 Non-pressure chronic ulcer of other part of unspecified foot with unspecified severity: Secondary | ICD-10-CM

## 2016-10-29 DIAGNOSIS — L97512 Non-pressure chronic ulcer of other part of right foot with fat layer exposed: Secondary | ICD-10-CM | POA: Diagnosis not present

## 2016-10-29 DIAGNOSIS — E1143 Type 2 diabetes mellitus with diabetic autonomic (poly)neuropathy: Secondary | ICD-10-CM | POA: Diagnosis not present

## 2016-10-30 NOTE — Progress Notes (Signed)
   Subjective:  Patient presents today for follow-up evaluation of right foot ulcerations secondary to diabetes mellitus as well as cellulitis chronic to the second digit right foot. Patient states that he is doing better. He denies any other complaints.   Objective/Physical Exam General: The patient is alert and oriented x3 in no acute distress.  Dermatology:  Wound #1 noted to the lateral aspect of the fifth MPJ right foot has healed. Complete reepithelialization has occurred.  Wound #2 noted to the distal tuft of the second digit right foot measuring [id number] cm (LxWxD).   To the noted ulceration(s), there is no eschar. There is a moderate amount of slough, fibrin, and necrotic tissue noted. Granulation tissue and wound base is red. There is a minimal amount of serosanguineous drainage noted. There is no exposed bone muscle-tendon ligament or joint. There is no malodor. Periwound integrity is intact. Skin is warm, dry and supple bilateral lower extremities.  Vascular: Palpable pedal pulses bilaterally. No edema or erythema noted. Capillary refill within normal limits.  Neurological: Epicritic and protective threshold absent bilaterally.    Assessment: #1 right foot ulcerations secondary to diabetes mellitus #2 diabetes mellitus w/ peripheral neuropathy #3 cellulitis second digit right foot   Plan of Care:  #1 Patient was evaluated. #2 medically necessary excisional debridement including subcutaneous tissue was performed using a tissue nipper and a chisel blade. Excisional debridement of all the necrotic nonviable tissue down to healthy bleeding viable tissue was performed with post-debridement measurements same as pre-. #3 the wound was cleansed and dry sterile dressing applied. #4 toe crest pads dispensed #5 may require medically necessary skin grafting  #6 return to clinic in 2 weeks     Edrick Kins, DPM [facility]  Dr. Edrick Kins, [address]                                        [place], [place] [address]                Office [phone]  Fax [phone]

## 2016-11-12 ENCOUNTER — Ambulatory Visit: Payer: Medicare Other | Admitting: Podiatry

## 2016-11-26 ENCOUNTER — Ambulatory Visit (INDEPENDENT_AMBULATORY_CARE_PROVIDER_SITE_OTHER): Payer: Medicare Other | Admitting: Podiatry

## 2016-11-26 ENCOUNTER — Encounter: Payer: Self-pay | Admitting: Podiatry

## 2016-11-26 DIAGNOSIS — I70235 Atherosclerosis of native arteries of right leg with ulceration of other part of foot: Secondary | ICD-10-CM

## 2016-11-26 DIAGNOSIS — L97512 Non-pressure chronic ulcer of other part of right foot with fat layer exposed: Secondary | ICD-10-CM | POA: Diagnosis not present

## 2016-11-28 NOTE — Progress Notes (Signed)
   Subjective:  Patient presents today for follow-up evaluation of an ulceration to the second right toe. She reports associated drainage and redness. She denies pain. She denies fever or chills.   Objective/Physical Exam General: The patient is alert and oriented x3 in no acute distress.  Dermatology:  Wound #1 noted to the second digit right foot measuring [id number] cm (LxWxD).   To the noted ulceration(s), there is no eschar. There is a moderate amount of slough, fibrin, and necrotic tissue noted. Granulation tissue and wound base is red. There is a minimal amount of serosanguineous drainage noted. There is no exposed bone muscle-tendon ligament or joint. There is no malodor. Periwound integrity is intact. Skin is warm, dry and supple bilateral lower extremities.  Vascular: Palpable pedal pulses bilaterally. No edema or erythema noted. Capillary refill within normal limits.  Neurological: Epicritic and protective threshold absent bilaterally.    Assessment: #1 right foot ulcerations 2nd digit secondary to diabetes mellitus   Plan of Care:  #1 Patient was evaluated. #2 medically necessary excisional debridement including subcutaneous tissue was performed using a tissue nipper and a chisel blade. Excisional debridement of all the necrotic nonviable tissue down to healthy bleeding viable tissue was performed with post-debridement measurements same as pre-. #3 the wound was cleansed and dry sterile dressing applied. #4 Apply Prisma every other day #5 return to clinic in 3 weeks     Edrick Kins, DPM [facility]  Dr. Edrick Kins, [address]                                        [place], [place] [address]                Office [phone]  Fax [phone]

## 2016-11-29 ENCOUNTER — Ambulatory Visit: Payer: Medicare Other | Admitting: Podiatry

## 2016-12-12 ENCOUNTER — Telehealth: Payer: Self-pay | Admitting: *Deleted

## 2016-12-12 NOTE — Telephone Encounter (Addendum)
[facility] states pt is having trouble getting his diabetic shoes and their office has sent in the paperwork, please call.12/13/2016-I informed [place] I had sent a message to the [place] office that takes care of their own diabetic shoe paperwork.12/27/2016-Pt states Dr. Myrtis Ser should receive the diabetic shoe paperwork.

## 2016-12-17 ENCOUNTER — Ambulatory Visit (INDEPENDENT_AMBULATORY_CARE_PROVIDER_SITE_OTHER): Payer: Medicare Other | Admitting: Podiatry

## 2016-12-17 DIAGNOSIS — M2041 Other hammer toe(s) (acquired), right foot: Secondary | ICD-10-CM

## 2016-12-17 DIAGNOSIS — L97512 Non-pressure chronic ulcer of other part of right foot with fat layer exposed: Secondary | ICD-10-CM

## 2016-12-17 DIAGNOSIS — I70235 Atherosclerosis of native arteries of right leg with ulceration of other part of foot: Secondary | ICD-10-CM

## 2016-12-17 DIAGNOSIS — E0842 Diabetes mellitus due to underlying condition with diabetic polyneuropathy: Secondary | ICD-10-CM | POA: Diagnosis not present

## 2016-12-18 NOTE — Progress Notes (Signed)
   Subjective:  Patient presents today for follow-up evaluation of an ulceration to the second right toe. He states that the pain and the appearance of the ulcer has improved significantly over the past few weeks.   Objective/Physical Exam General: The patient is alert and oriented x3 in no acute distress.  Dermatology:  Wound #1 noted to the second digit right foot measuring [id number] cm (LxWxD).   To the noted ulceration(s), there is no eschar. There is a moderate amount of slough, fibrin, and necrotic tissue noted. Granulation tissue and wound base is red. There is a minimal amount of serosanguineous drainage noted. There is no exposed bone muscle-tendon ligament or joint. There is no malodor. Periwound integrity is intact. Skin is warm, dry and supple bilateral lower extremities.  Vascular: Palpable pedal pulses bilaterally. No edema or erythema noted. Capillary refill within normal limits.  Neurological: Epicritic and protective threshold absent bilaterally.   Musculoskeletal: Flexible hammertoe contracture noted to the second digit right foot. With hammertoes likely contributory to the distal tuft ulceration mentioned above.  Assessment: 1. right foot ulcerations 2nd digit secondary to diabetes mellitus 2. 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 Today the patient was evaluated 2. Today I discussed with the patient the possibility of performing a flexor tenotomy in the office. Patient agrees with in office procedure flexor tenotomy to the second digit to help alleviate pressure from the distal tuft ulceration of the second toe. Prior to flexor tenotomy local anesthesia infiltration was utilized in a digital block consisting of 3 mL of 2% lidocaine plain. The toe was prepped and aseptic manner and a #11 surgical blade was utilized to perform a percutaneous flexor tenotomy at the level of the PIPJ. 3. The toe was then cleansed with alcohol  and normal saline and dry sterile dressing was applied. 4. Return to clinic in 1 week. Keep dressings clean dry and intact until then.    Edrick Kins, DPM [facility]  Dr. Edrick Kins, [address]                                        [place], [place] [address]                Office [phone]  Fax [phone]

## 2016-12-24 ENCOUNTER — Ambulatory Visit (INDEPENDENT_AMBULATORY_CARE_PROVIDER_SITE_OTHER): Payer: Self-pay | Admitting: Podiatry

## 2016-12-24 DIAGNOSIS — L97512 Non-pressure chronic ulcer of other part of right foot with fat layer exposed: Secondary | ICD-10-CM

## 2016-12-24 DIAGNOSIS — I70235 Atherosclerosis of native arteries of right leg with ulceration of other part of foot: Secondary | ICD-10-CM

## 2016-12-24 DIAGNOSIS — E0842 Diabetes mellitus due to underlying condition with diabetic polyneuropathy: Secondary | ICD-10-CM

## 2016-12-24 NOTE — Progress Notes (Signed)
   HPI: 51 year old male with a history of diabetes mellitus and toe amputation to the right foot presents today for follow-up evaluation of an ulcer to the second digit right foot. Patient was last seen last week at which time an in office flexor tenotomy was performed of the second digit right foot. Patient presents today for follow-up treatment and evaluation also to be fitted for custom diabetic insoles and diabetic shoes.   Physical Exam: General: The patient is alert and oriented x3 in no acute distress.  Dermatology: Skin incision to the flexor tenotomy on the plantar aspect of the second digit is intact. No drainage noted. Incision site appears to have been completely healed.  Small ulcer noted to the distal tuft of the second digit however alleviation of the flexor deformity appears to be improved and the ulcer is no longer on the weightbearing surface of the distal tuft of the digit.  Vascular: Palpable pedal pulses bilaterally. No edema or erythema noted. Capillary refill within normal limits.  Neurological: Epicritic and protective threshold absent bilaterally.   Musculoskeletal Exam: History of third digit amputation right foot. Hammertoe contracture deformity improved second digit right foot  Assessment: 1. Right foot ulcer second digit secondary diabetes mellitus 2. Diabetes with neuropathy   Plan of Care:  1. Patient was evaluated. 2. Today the patient was molded for Plastizote insoles and diabetic shoes. Authorization for diabetic shoes was initiated today. 3. Return to clinic in 3-4 weeks to pick up diabetic shoes and insoles   Edrick Kins, DPM [facility]  Dr. Edrick Kins, [address]                                        [place], [place] [address]                Office [phone]  Fax [phone]

## 2016-12-30 NOTE — Telephone Encounter (Signed)
Received message and Diabetic shoes have been ordered

## 2017-01-14 ENCOUNTER — Ambulatory Visit: Payer: Medicare Other | Admitting: Podiatry

## 2017-01-15 NOTE — Telephone Encounter (Signed)
FYI:  Dr. Jeannene Patella is not responding to surefit faxes.Marland KitchenMarland KitchenI called patient and left message if his office doesn't respond by next week, the order will be cancelled

## 2017-01-17 ENCOUNTER — Ambulatory Visit (INDEPENDENT_AMBULATORY_CARE_PROVIDER_SITE_OTHER): Payer: Self-pay | Admitting: Podiatry

## 2017-01-17 DIAGNOSIS — I70235 Atherosclerosis of native arteries of right leg with ulceration of other part of foot: Secondary | ICD-10-CM

## 2017-01-17 DIAGNOSIS — E0842 Diabetes mellitus due to underlying condition with diabetic polyneuropathy: Secondary | ICD-10-CM

## 2017-01-17 NOTE — Progress Notes (Signed)
   HPI: 51 year old male with a history of diabetes mellitus and toe amputation to the right foot presents today for follow-up evaluation of an ulcer to the second digit right foot. Approximately 3 weeks ago the patient underwent an in office procedure flexor tenotomy of the second digit right foot to alleviate pressure of the ulcer to the distal tuft. Patient believes the flexor tenotomy work and now there is no longer pressure on the distal tuft of the digit causing ulcer. The ulcer appearance appears to be greatly improved.   Physical Exam: General: The patient is alert and oriented x3 in no acute distress.  Dermatology: Ulcer to the distal tuft second digit right foot appears completely resolved. Complete reepithelialization has occurred. There is no open lesions of the bilateral feet at the moment.  Vascular: Palpable pedal pulses bilaterally. No edema or erythema noted. Capillary refill within normal limits.  Neurological: Epicritic and protective threshold absent bilaterally.   Musculoskeletal Exam: History of third digit amputation right foot.   Assessment: 1. Right foot ulcer second digit secondary diabetes mellitus-resolved 2. Diabetes with neuropathy   Plan of Care:  1. Patient was evaluated. 2. Today we are still trying to get the patient approved for diabetic shoes with Plastizote inserts. We are currently working with his primary care doctor to obtain authorization. Sensory authorization for the diabetic shoes will get them ordered and dispensed area these are doubly medically necessary to prevent further ulcers and future amputations 3. Return to clinic in 3 months  Edrick Kins, DPM [facility]  Dr. Edrick Kins, [facility]                                        [place], [place] [address]                Office [phone]  Fax [phone]

## 2017-02-06 ENCOUNTER — Ambulatory Visit (INDEPENDENT_AMBULATORY_CARE_PROVIDER_SITE_OTHER): Payer: Self-pay | Admitting: Podiatry

## 2017-02-06 DIAGNOSIS — M2041 Other hammer toe(s) (acquired), right foot: Secondary | ICD-10-CM

## 2017-02-06 DIAGNOSIS — E0842 Diabetes mellitus due to underlying condition with diabetic polyneuropathy: Secondary | ICD-10-CM

## 2017-02-06 DIAGNOSIS — L97512 Non-pressure chronic ulcer of other part of right foot with fat layer exposed: Secondary | ICD-10-CM

## 2017-02-06 NOTE — Telephone Encounter (Signed)
Patient came into office today to [facility]

## 2017-02-06 NOTE — Patient Instructions (Signed)

## 2017-02-06 NOTE — Progress Notes (Signed)
Patient ID: Ralph Peterson, male   DOB: 08/18/1965, 51 y.o.   MRN: [id number] Patient presents for diabetic shoe pick up, shoes are tried on for good fit.  Patient received 1 Pair and 3 pairs custom molded diabetic inserts.  Verbal and written break in and wear instructions given.  Patient will follow up for scheduled routine care.

## 2017-08-26 ENCOUNTER — Ambulatory Visit: Payer: Medicare Other | Admitting: Podiatry

## 2017-08-26 ENCOUNTER — Ambulatory Visit (INDEPENDENT_AMBULATORY_CARE_PROVIDER_SITE_OTHER): Payer: Medicare Other

## 2017-08-26 DIAGNOSIS — I70245 Atherosclerosis of native arteries of left leg with ulceration of other part of foot: Secondary | ICD-10-CM

## 2017-08-26 DIAGNOSIS — E08621 Diabetes mellitus due to underlying condition with foot ulcer: Secondary | ICD-10-CM

## 2017-08-26 DIAGNOSIS — L97511 Non-pressure chronic ulcer of other part of right foot limited to breakdown of skin: Secondary | ICD-10-CM

## 2017-08-26 DIAGNOSIS — L97522 Non-pressure chronic ulcer of other part of left foot with fat layer exposed: Secondary | ICD-10-CM | POA: Diagnosis not present

## 2017-08-26 MED ORDER — DOXYCYCLINE HYCLATE 100 MG PO TABS: 100.0000 mg | ORAL_TABLET | Freq: Two times a day (BID) | ORAL | 0 refills | Status: DC

## 2017-08-28 ENCOUNTER — Telehealth: Payer: Self-pay | Admitting: *Deleted

## 2017-08-28 ENCOUNTER — Other Ambulatory Visit: Payer: Self-pay

## 2017-08-28 DIAGNOSIS — R0989 Other specified symptoms and signs involving the circulatory and respiratory systems: Secondary | ICD-10-CM

## 2017-08-28 NOTE — Telephone Encounter (Signed)
Dr. Amalia Hailey ordered Arterial Dopplers diminished pulses and left lower extremity ulcer.

## 2017-08-28 NOTE — Telephone Encounter (Signed)
Faxed orders to [facility]. 

## 2017-08-28 NOTE — Progress Notes (Signed)
Error

## 2017-09-02 ENCOUNTER — Ambulatory Visit (INDEPENDENT_AMBULATORY_CARE_PROVIDER_SITE_OTHER): Payer: Medicare Other | Admitting: Podiatry

## 2017-09-02 ENCOUNTER — Telehealth: Payer: Self-pay | Admitting: *Deleted

## 2017-09-02 ENCOUNTER — Encounter: Payer: Self-pay | Admitting: Podiatry

## 2017-09-02 DIAGNOSIS — E0842 Diabetes mellitus due to underlying condition with diabetic polyneuropathy: Secondary | ICD-10-CM | POA: Diagnosis not present

## 2017-09-02 DIAGNOSIS — R0989 Other specified symptoms and signs involving the circulatory and respiratory systems: Secondary | ICD-10-CM | POA: Diagnosis not present

## 2017-09-02 NOTE — Telephone Encounter (Signed)
Left message informing [facility] of pt's cancellation due to conditioning worsening.

## 2017-09-02 NOTE — Telephone Encounter (Signed)
I asked pt if he would be okay to come to [facility] for appts for circulation testing and consultation and pt agreed. [facility] Scheduled pt for 09/04/2017 1:30pm arrival 1:15pm for ABI with TBI and Arterial Dopplers and consultation appt with Dr. Fletcher Anon 09/23/2017 at 8:40am arrival 8:15am.

## 2017-09-02 NOTE — Telephone Encounter (Signed)
I informed pt of [facility] appts and gave their address and [phone].

## 2017-09-02 NOTE — Progress Notes (Signed)
   HPI: 52 year old male with a history of diabetes mellitus and critical limb ischemia to the left lower extremity presents today for acute ischemic changes regarding his ulcers.  Patient was last seen on 08/26/2017 at which time some dusky discoloration of digits 3 and 4 of the left foot were noticed and a referral for vascular consultation was placed.  Patient states that over the past week the discoloration has increased significantly and he developed a red rash to the top of his foot.  He denies any significant pain however he is concerned due to the acute changes.  He presents today for further treatment and evaluation  No past medical history on file.   Physical Exam: General: The patient is alert and oriented x3 in no acute distress.  Dermatology: Skin is cold to touch significantly on the digits 3 and 4 of the left foot.  The discoloration to the toes has increased over the past week.  The interdigital webspace between digits 3 and 4 appears to have an ischemic gangrenous appearance.  Negative for any significant drainage or malodor.  There is purple discoloration extending proximal to the dorsum of the foot with a superficial red dermatitis type discoloration extending proximal to the level of the ankle.  Please see attached picture.        Vascular: Diminished pedal pulses specifically to the left lower extremity. No edema or erythema noted.  Toes are cold to touch left lower extremity  Neurological: Epicritic and protective threshold absent bilaterally.   Musculoskeletal Exam: Range of motion within normal limits to all pedal and ankle joints bilateral. Muscle strength 5/5 in all groups bilateral.   Assessment: -Critical limb ischemia left lower extremity -Ischemic gangrenous changes to digits 3 and 4 of the left lower extremity   Plan of Care:  -Patient was evaluated today. -Due to the acute changes the patient needs an emergent vascular consultation with workup and possible  intervention. -Today we will arrange our office to schedule a vascular consultation as soon as possible -Return to clinic in 3 weeks.  Continue daily application of Betadine to the interdigital areas of the foot and wearing the postoperative shoe   Edrick Kins, DPM [facility]  Dr. Edrick Kins, DPM    [address], [place] [address]                Office [phone]  Fax [phone]

## 2017-09-02 NOTE — Telephone Encounter (Signed)
-----   Message from Edrick Kins, DPM sent at 09/02/2017  3:27 PM EST ----- Regarding: Appt for vascular asap.  Fredia Sorrow, I think Angie arranged a referral to vascular last week but they couldn't see him until the 27th. His condition has worsened and he needs to get in asap.   Thanks, Dr. Amalia Hailey

## 2017-09-02 NOTE — Telephone Encounter (Addendum)
Faxed orders to [facility]. 

## 2017-09-03 NOTE — Telephone Encounter (Signed)
Faxed referral, clinicals of 09/02/2017 and demographics to [facility].

## 2017-09-04 ENCOUNTER — Ambulatory Visit (HOSPITAL_COMMUNITY)
Admission: RE | Admit: 2017-09-04 | Discharge: 2017-09-04 | Disposition: A | Discharge status: Home / Self Care | Payer: Medicare Other | Source: Ambulatory Visit | Attending: Cardiology | Admitting: Cardiology

## 2017-09-04 DIAGNOSIS — M79605 Pain in left leg: Secondary | ICD-10-CM | POA: Diagnosis not present

## 2017-09-04 DIAGNOSIS — Z87891 Personal history of nicotine dependence: Secondary | ICD-10-CM | POA: Diagnosis not present

## 2017-09-04 DIAGNOSIS — E119 Type 2 diabetes mellitus without complications: Secondary | ICD-10-CM | POA: Insufficient documentation

## 2017-09-04 DIAGNOSIS — R0989 Other specified symptoms and signs involving the circulatory and respiratory systems: Secondary | ICD-10-CM | POA: Diagnosis not present

## 2017-09-04 DIAGNOSIS — I10 Essential (primary) hypertension: Secondary | ICD-10-CM | POA: Diagnosis not present

## 2017-09-04 DIAGNOSIS — I70202 Unspecified atherosclerosis of native arteries of extremities, left leg: Secondary | ICD-10-CM | POA: Insufficient documentation

## 2017-09-04 DIAGNOSIS — E785 Hyperlipidemia, unspecified: Secondary | ICD-10-CM | POA: Insufficient documentation

## 2017-09-04 DIAGNOSIS — I2510 Atherosclerotic heart disease of native coronary artery without angina pectoris: Secondary | ICD-10-CM | POA: Diagnosis not present

## 2017-09-05 ENCOUNTER — Ambulatory Visit: Payer: Medicare Other | Admitting: Cardiovascular Disease

## 2017-09-05 ENCOUNTER — Other Ambulatory Visit
Admission: RE | Admit: 2017-09-05 | Discharge: 2017-09-05 | Disposition: A | Discharge status: Home / Self Care | Payer: Medicare Other | Source: Ambulatory Visit | Attending: Cardiovascular Disease | Admitting: Cardiovascular Disease

## 2017-09-05 ENCOUNTER — Encounter: Payer: Self-pay | Admitting: Cardiovascular Disease

## 2017-09-05 ENCOUNTER — Telehealth: Payer: Self-pay

## 2017-09-05 VITALS — BP 110/60 | HR 99 | Ht 72.0 in | Wt 251.2 lb

## 2017-09-05 DIAGNOSIS — I998 Other disorder of circulatory system: Secondary | ICD-10-CM | POA: Diagnosis not present

## 2017-09-05 DIAGNOSIS — Z01812 Encounter for preprocedural laboratory examination: Secondary | ICD-10-CM | POA: Insufficient documentation

## 2017-09-05 DIAGNOSIS — Z01818 Encounter for other preprocedural examination: Secondary | ICD-10-CM | POA: Diagnosis not present

## 2017-09-05 DIAGNOSIS — I10 Essential (primary) hypertension: Secondary | ICD-10-CM | POA: Diagnosis not present

## 2017-09-05 DIAGNOSIS — E785 Hyperlipidemia, unspecified: Secondary | ICD-10-CM

## 2017-09-05 DIAGNOSIS — I70229 Atherosclerosis of native arteries of extremities with rest pain, unspecified extremity: Secondary | ICD-10-CM

## 2017-09-05 LAB — BASIC METABOLIC PANEL
ANION GAP: 13 (ref 5–15)
BUN: 20 mg/dL (ref 6–20)
CALCIUM: 7.6 mg/dL — AB (ref 8.9–10.3)
CO2: 29 mmol/L (ref 22–32)
CREATININE: 4.86 mg/dL — AB (ref 0.61–1.24)
Chloride: 93 mmol/L — ABNORMAL LOW (ref 101–111)
GFR calc Af Amer: 15 mL/min — ABNORMAL LOW (ref >60)
GFR, EST NON AFRICAN AMERICAN: 13 mL/min — AB (ref >60)
GLUCOSE: 204 mg/dL — AB (ref 65–99)
Potassium: 3.8 mmol/L (ref 3.5–5.1)
Sodium: 135 mmol/L (ref 135–145)

## 2017-09-05 LAB — CBC
HCT: 34.3 % — ABNORMAL LOW (ref 40.0–52.0)
Hemoglobin: 11.6 g/dL — ABNORMAL LOW (ref 13.0–18.0)
MCH: 32 pg (ref 26.0–34.0)
MCHC: 33.8 g/dL (ref 32.0–36.0)
MCV: 94.5 fL (ref 80.0–100.0)
PLATELETS: 289 10*3/uL (ref 150–440)
RBC: 3.63 MIL/uL — ABNORMAL LOW (ref 4.40–5.90)
RDW: 14.9 % — AB (ref 11.5–14.5)
WBC: 12 10*3/uL — AB (ref 3.8–10.6)

## 2017-09-05 LAB — PROTIME-INR
INR: 1.16
Prothrombin Time: 14.7 seconds (ref 11.4–15.2)

## 2017-09-05 NOTE — Patient Instructions (Addendum)
Medication Instructions:  Your physician recommends that you continue on your current medications as directed. Please refer to the Current Medication list given to you today.   Labwork: BMET, CBC, PT/INR at the [place] lab  Testing/Procedures: Abdominal aortogram with possible angioplasty [facility] [place]. [address] [place] [phone]  Wednesday, February 20 Arrival time   6:30am  Nothing to eat or drink after midnight the evening before your procedure.  You may take your morning medications with a sip of water Wear clothes and shoes that are easy to slip on and off Pack an overnight bag for a one night stay Please have someone to drive you home   Follow-Up: Your physician recommends that you schedule a follow-up appointment in: 1 month with Dr. Fletcher Anon.    Any Other Special Instructions Will Be Listed Below (If Applicable).     If you need a refill on your cardiac medications before your next appointment, please call your pharmacy.

## 2017-09-05 NOTE — H&P (View-Only) (Signed)
Cardiology Office Note   Date:  09/05/2017   ID:  Ralph Peterson, DOB 07-13-1966, MRN [id number]  PCP:  [facility]  Cardiologist:   Kathlyn Sacramento, MD   Chief Complaint  Patient presents with  . OTHER    ABN ABI. Meds reviewed verbally with pt.      History of Present Illness: Ralph Peterson is a 52 y.o. male who was referred by Dr. Amalia Hailey for evaluation and management of peripheral arterial disease and critical limb ischemia.  The patient has known history of end-stage renal disease on hemodialysis for the last 5 years.  He is on the transplant list at [facility].  He has other comorbidities include peripheral arterial disease, hypertension, diabetes mellitus and hyperlipidemia.  He quit smoking in 2010. He developed critical limb ischemia on the right side in 2017 and he was treated at that time by Dr. Delana Meyer.  He had drug-coated balloon angioplasty to the right SFA, popliteal artery and peroneal artery with good results.  He subsequently underwent amputation of third toe with good healing. Recently, he started having left leg pain with walking.  This started in December.  Over the last 2 weeks he noticed dark discoloration of the third and fourth toe which progressed to gangrene. He underwent noninvasive vascular study which showed noncompressible vessels.  Right lower extremity vessels appeared patent.  On the left side, the popliteal artery was noted to be occluded.  The patient has not followed with Dr. Delana Meyer since 2017 and could not get an early appointment to see him and thus he was added to my schedule.  Past Medical History:  Diagnosis Date  . Chronic kidney disease   . Coronary artery disease   . Diabetes mellitus without complication ([place])   . Hyperlipidemia   . Hypertension     Past Surgical History:  Procedure Laterality Date  . CARDIAC CATHETERIZATION  2012   [facility]; X1 stent 2.5 x 33 mm xience stent Distal TCA    . PERIPHERAL VASCULAR CATHETERIZATION Right 03/26/2016   Procedure: Lower Extremity Angiography;  Surgeon: Katha Cabal, MD;  Location: [place] [facility] [facility];  Service: Cardiovascular;  Laterality: Right;     Current Outpatient Medications  Medication Sig Dispense Refill  . amLODipine (NORVASC) 10 MG tablet Take by mouth.    . ASPIRIN 81 PO 1 tablet by mouth daily    . atorvastatin (LIPITOR) 10 MG tablet Take 10 mg by mouth daily.     . carvedilol (COREG) 25 MG tablet Take 25 mg by mouth 2 (two) times daily with a meal.     . escitalopram (LEXAPRO) 10 MG tablet Take 10 mg by mouth daily.     Marland Kitchen lisinopril (PRINIVIL,ZESTRIL) 2.5 MG tablet Take 2.5 mg by mouth. Tuesday, Thursday , Saturday and Sunday on non dialysis.    Marland Kitchen sevelamer carbonate (RENVELA) 800 MG tablet Take 1,600 mg by mouth 3 (three) times daily with meals.     . vitamin C (ASCORBIC ACID) 500 MG tablet Take by mouth.     No current facility-administered medications for this visit.     Allergies:   Penicillins    Social History:  The patient  reports that  has never smoked. he has never used smokeless tobacco. He reports that he does not drink alcohol or use drugs.   Family History:  The patient's family history includes Hypertension in his mother.    ROS:  Please see the  history of present illness.   Otherwise, review of systems are positive for none.   All other systems are reviewed and negative.    PHYSICAL EXAM: VS:  BP 110/60 (BP Location: Left Arm, Patient Position: Sitting, Cuff Size: Normal)   Pulse 99   Ht 6' (1.829 m)   Wt 251 lb 4 oz (114 kg)   BMI 34.08 kg/m  , BMI Body mass index is 34.08 kg/m. GEN: Well nourished, well developed, in no acute distress  HEENT: normal  Neck: no JVD, carotid bruits, or masses Cardiac: RRR; no , rubs, or gallops,no edema .  1 out of 6 systolic murmur at the base Respiratory:  clear to auscultation bilaterally, normal work of breathing GI: soft, nontender,  nondistended, + BS MS: no deformity or atrophy  Skin: warm and dry, no rash Neuro:  Strength and sensation are intact Psych: euthymic mood, full affect Vascular: Femoral pulses are normal bilaterally.  Distal pulses are not palpable on the left side with gangrene affecting the third and fourth toes   EKG:  EKG is ordered today. The ekg ordered today demonstrates normal sinus rhythm with left axis deviation and right bundle branch block.   Recent Labs: No results found for requested labs within last 8760 hours.    Lipid Panel No results found for: CHOL, TRIG, HDL, CHOLHDL, VLDL, LDLCALC, LDLDIRECT    Wt Readings from Last 3 Encounters:  09/05/17 251 lb 4 oz (114 kg)  03/26/16 240 lb (108.9 kg)  06/20/15 240 lb (108.9 kg)        PAD Screen 09/05/2017  Previous PAD dx? Yes  Previous surgical procedure? Yes  Dates of procedures Right leg balloon  Pain with walking? Yes  Subsides with rest? No  Feet/toe relief with dangling? No  Painful, non-healing ulcers? Yes  Extremities discolored? Yes      ASSESSMENT AND PLAN:  1.  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  I recommend proceeding with urgent abdominal aortogram with lower extremity runoff and possible endovascular intervention.  I discussed the procedure in details as well as risks and benefits.  The patient most likely will require amputation of third and fourth toes after revascularization.  2.  Essential hypertension: Blood pressure is controlled on current medications.  3.  Hyperlipidemia: Continue treatment with atorvastatin.  4.  End-stage renal disease on hemodialysis on Monday Wednesday Friday: His dialysis center can accommodate dialysis on Wednesday afternoon or Thursday morning if needed according to the patient. Given that he is on the kidney transplant list, I asked him to notify his transplant center to make him  inactive until critical limb ischemia resolves.    Disposition:   FU with me in 1 month  Signed,  Kathlyn Sacramento, MD  09/05/2017 3:46 PM    [place]

## 2017-09-05 NOTE — Telephone Encounter (Signed)
Called patient to see at 3 pm today

## 2017-09-05 NOTE — Progress Notes (Signed)
Cardiology Office Note   Date:  09/05/2017   ID:  Ralph Peterson, DOB 30-Oct-1965, MRN [id number]  PCP:  [facility]  Cardiologist:   Kathlyn Sacramento, MD   Chief Complaint  Patient presents with  . OTHER    ABN ABI. Meds reviewed verbally with pt.      History of Present Illness: Ralph Peterson is a 52 y.o. male who was referred by Dr. Amalia Hailey for evaluation and management of peripheral arterial disease and critical limb ischemia.  The patient has known history of end-stage renal disease on hemodialysis for the last 5 years.  He is on the [facility] list at [facility].  He has other comorbidities include peripheral arterial disease, hypertension, diabetes mellitus and hyperlipidemia.  He quit smoking in 2010. He developed critical limb ischemia on the right side in 2017 and he was treated at that time by Dr. Delana Meyer.  He had drug-coated balloon angioplasty to the right SFA, popliteal artery and peroneal artery with good results.  He subsequently underwent amputation of third toe with good healing. Recently, he started having left leg pain with walking.  This started in December.  Over the last 2 weeks he noticed dark discoloration of the third and fourth toe which progressed to gangrene. He underwent noninvasive vascular study which showed noncompressible vessels.  Right lower extremity vessels appeared patent.  On the left side, the popliteal artery was noted to be occluded.  The patient has not followed with Dr. Delana Meyer since 2017 and could not get an early appointment to see him and thus he was added to my schedule.  Past Medical History:  Diagnosis Date  . Chronic kidney disease   . Coronary artery disease   . Diabetes mellitus without complication ([place])   . Hyperlipidemia   . Hypertension     Past Surgical History:  Procedure Laterality Date  . CARDIAC CATHETERIZATION  2012   [facility]; X1 stent 2.5 x 33 mm xience stent Distal TCA    . PERIPHERAL VASCULAR CATHETERIZATION Right 03/26/2016   Procedure: Lower Extremity Angiography;  Surgeon: Katha Cabal, MD;  Location: [place] CV [facility];  Service: Cardiovascular;  Laterality: Right;     Current Outpatient Medications  Medication Sig Dispense Refill  . amLODipine (NORVASC) 10 MG tablet Take by mouth.    . ASPIRIN 81 PO 1 tablet by mouth daily    . atorvastatin (LIPITOR) 10 MG tablet Take 10 mg by mouth daily.     . carvedilol (COREG) 25 MG tablet Take 25 mg by mouth 2 (two) times daily with a meal.     . escitalopram (LEXAPRO) 10 MG tablet Take 10 mg by mouth daily.     Marland Kitchen lisinopril (PRINIVIL,ZESTRIL) 2.5 MG tablet Take 2.5 mg by mouth. Tuesday, Thursday , Saturday and Sunday on non dialysis.    Marland Kitchen sevelamer carbonate (RENVELA) 800 MG tablet Take 1,600 mg by mouth 3 (three) times daily with meals.     . vitamin C (ASCORBIC ACID) 500 MG tablet Take by mouth.     No current facility-administered medications for this visit.     Allergies:   Penicillins    Social History:  The patient  reports that  has never smoked. he has never used smokeless tobacco. He reports that he does not drink alcohol or use drugs.   Family History:  The patient's family history includes Hypertension in his mother.    ROS:  Please see the  history of present illness.   Otherwise, review of systems are positive for none.   All other systems are reviewed and negative.    PHYSICAL EXAM: VS:  BP 110/60 (BP Location: Left Arm, Patient Position: Sitting, Cuff Size: Normal)   Pulse 99   Ht 6' (1.829 m)   Wt 251 lb 4 oz (114 kg)   BMI 34.08 kg/m  , BMI Body mass index is 34.08 kg/m. GEN: Well nourished, well developed, in no acute distress  HEENT: normal  Neck: no JVD, carotid bruits, or masses Cardiac: RRR; no , rubs, or gallops,no edema .  1 out of 6 systolic murmur at the base Respiratory:  clear to auscultation bilaterally, normal work of breathing GI: soft, nontender,  nondistended, + BS MS: no deformity or atrophy  Skin: warm and dry, no rash Neuro:  Strength and sensation are intact Psych: euthymic mood, full affect Vascular: Femoral pulses are normal bilaterally.  Distal pulses are not palpable on the left side with gangrene affecting the third and fourth toes   EKG:  EKG is ordered today. The ekg ordered today demonstrates normal sinus rhythm with left axis deviation and right bundle branch block.   Recent Labs: No results found for requested labs within last 8760 hours.    Lipid Panel No results found for: CHOL, TRIG, HDL, CHOLHDL, VLDL, LDLCALC, LDLDIRECT    Wt Readings from Last 3 Encounters:  09/05/17 251 lb 4 oz (114 kg)  03/26/16 240 lb (108.9 kg)  06/20/15 240 lb (108.9 kg)        PAD Screen 09/05/2017  Previous PAD dx? Yes  Previous surgical procedure? Yes  Dates of procedures Right leg balloon  Pain with walking? Yes  Subsides with rest? No  Feet/toe relief with dangling? No  Painful, non-healing ulcers? Yes  Extremities discolored? Yes      ASSESSMENT AND PLAN:  1.  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  I recommend proceeding with urgent abdominal aortogram with lower extremity runoff and possible endovascular intervention.  I discussed the procedure in details as well as risks and benefits.  The patient most likely will require amputation of third and fourth toes after revascularization.  2.  Essential hypertension: Blood pressure is controlled on current medications.  3.  Hyperlipidemia: Continue treatment with atorvastatin.  4.  End-stage renal disease on hemodialysis on Monday Wednesday Friday: His dialysis center can accommodate dialysis on Wednesday afternoon or Thursday morning if needed according to the patient. Given that he is on the kidney transplant list, I asked him to notify his transplant center to make him  inactive until critical limb ischemia resolves.    Disposition:   FU with me in 1 month  Signed,  Kathlyn Sacramento, MD  09/05/2017 3:46 PM    [name]

## 2017-09-10 ENCOUNTER — Encounter (HOSPITAL_COMMUNITY): Payer: Self-pay | Admitting: Cardiovascular Disease

## 2017-09-10 ENCOUNTER — Ambulatory Visit (HOSPITAL_COMMUNITY)
Admission: RE | Disposition: A | Discharge status: Home / Self Care | Payer: Self-pay | Source: Ambulatory Visit | Attending: Cardiovascular Disease

## 2017-09-10 ENCOUNTER — Ambulatory Visit (HOSPITAL_COMMUNITY)
Admission: RE | Admit: 2017-09-10 | Discharge: 2017-09-10 | Disposition: A | Discharge status: Home / Self Care | Payer: Medicare Other | Source: Ambulatory Visit | Attending: Cardiovascular Disease | Admitting: Cardiovascular Disease

## 2017-09-10 DIAGNOSIS — Z992 Dependence on renal dialysis: Secondary | ICD-10-CM | POA: Insufficient documentation

## 2017-09-10 DIAGNOSIS — N186 End stage renal disease: Secondary | ICD-10-CM | POA: Diagnosis not present

## 2017-09-10 DIAGNOSIS — E1122 Type 2 diabetes mellitus with diabetic chronic kidney disease: Secondary | ICD-10-CM | POA: Diagnosis not present

## 2017-09-10 DIAGNOSIS — Z955 Presence of coronary angioplasty implant and graft: Secondary | ICD-10-CM | POA: Insufficient documentation

## 2017-09-10 DIAGNOSIS — I70229 Atherosclerosis of native arteries of extremities with rest pain, unspecified extremity: Secondary | ICD-10-CM

## 2017-09-10 DIAGNOSIS — E785 Hyperlipidemia, unspecified: Secondary | ICD-10-CM | POA: Insufficient documentation

## 2017-09-10 DIAGNOSIS — Z7682 Awaiting organ transplant status: Secondary | ICD-10-CM | POA: Insufficient documentation

## 2017-09-10 DIAGNOSIS — Z87891 Personal history of nicotine dependence: Secondary | ICD-10-CM | POA: Diagnosis not present

## 2017-09-10 DIAGNOSIS — I120 Hypertensive chronic kidney disease with stage 5 chronic kidney disease or end stage renal disease: Secondary | ICD-10-CM | POA: Insufficient documentation

## 2017-09-10 DIAGNOSIS — Z880 Allergy status to penicillin: Secondary | ICD-10-CM | POA: Diagnosis not present

## 2017-09-10 DIAGNOSIS — I70262 Atherosclerosis of native arteries of extremities with gangrene, left leg: Secondary | ICD-10-CM | POA: Diagnosis not present

## 2017-09-10 DIAGNOSIS — E1152 Type 2 diabetes mellitus with diabetic peripheral angiopathy with gangrene: Secondary | ICD-10-CM | POA: Insufficient documentation

## 2017-09-10 DIAGNOSIS — I998 Other disorder of circulatory system: Secondary | ICD-10-CM

## 2017-09-10 DIAGNOSIS — Z7982 Long term (current) use of aspirin: Secondary | ICD-10-CM | POA: Insufficient documentation

## 2017-09-10 DIAGNOSIS — I2510 Atherosclerotic heart disease of native coronary artery without angina pectoris: Secondary | ICD-10-CM | POA: Diagnosis not present

## 2017-09-10 DIAGNOSIS — I739 Peripheral vascular disease, unspecified: Secondary | ICD-10-CM | POA: Diagnosis present

## 2017-09-10 HISTORY — PX: LOWER EXTREMITY ANGIOGRAPHY: CATH118251

## 2017-09-10 HISTORY — PX: ABDOMINAL AORTOGRAM: CATH118222

## 2017-09-10 HISTORY — PX: PERIPHERAL VASCULAR BALLOON ANGIOPLASTY: CATH118281

## 2017-09-10 HISTORY — PX: PERIPHERAL VASCULAR INTERVENTION: CATH118257

## 2017-09-10 HISTORY — PX: PERIPHERAL VASCULAR ATHERECTOMY: CATH118256

## 2017-09-10 LAB — POCT ACTIVATED CLOTTING TIME
Activated Clotting Time: 219 seconds
Activated Clotting Time: 208 seconds
Activated Clotting Time: 224 seconds

## 2017-09-10 LAB — BASIC METABOLIC PANEL
ANION GAP: 19 — AB (ref 5–15)
BUN: 65 mg/dL — ABNORMAL HIGH (ref 6–20)
CALCIUM: 8 mg/dL — AB (ref 8.9–10.3)
CHLORIDE: 89 mmol/L — AB (ref 101–111)
CO2: 25 mmol/L (ref 22–32)
CREATININE: 10.26 mg/dL — AB (ref 0.61–1.24)
GFR calc non Af Amer: 5 mL/min — ABNORMAL LOW (ref >60)
GFR, EST AFRICAN AMERICAN: 6 mL/min — AB (ref >60)
Glucose, Bld: 235 mg/dL — ABNORMAL HIGH (ref 65–99)
Potassium: 5 mmol/L (ref 3.5–5.1)
SODIUM: 133 mmol/L — AB (ref 135–145)

## 2017-09-10 LAB — GLUCOSE, CAPILLARY: GLUCOSE-CAPILLARY: 137 mg/dL — AB (ref 65–99)

## 2017-09-10 SURGERY — ABDOMINAL AORTOGRAM
Anesthesia: LOCAL

## 2017-09-10 MED ORDER — MIDAZOLAM HCL 2 MG/2ML IJ SOLN
INTRAMUSCULAR | Status: DC | PRN
  Administered 2017-09-10: 1 mg via INTRAVENOUS

## 2017-09-10 MED ORDER — HEPARIN SODIUM (PORCINE) 1000 UNIT/ML IJ SOLN
INTRAMUSCULAR | Status: AC
  Filled 2017-09-10: qty 1

## 2017-09-10 MED ORDER — NITROGLYCERIN 1 MG/10 ML FOR IR/CATH LAB
INTRA_ARTERIAL | Status: DC | PRN
  Administered 2017-09-10 (×5): 200 ug via INTRA_ARTERIAL

## 2017-09-10 MED ORDER — HEPARIN (PORCINE) IN NACL 2-0.9 UNIT/ML-% IJ SOLN
INTRAMUSCULAR | Status: AC | PRN
  Administered 2017-09-10: 1000 mL

## 2017-09-10 MED ORDER — SODIUM CHLORIDE 0.9 % IV SOLN: 250.0000 mL | INTRAVENOUS | Status: DC | PRN

## 2017-09-10 MED ORDER — MIDAZOLAM HCL 2 MG/2ML IJ SOLN
INTRAMUSCULAR | Status: AC
  Filled 2017-09-10: qty 2

## 2017-09-10 MED ORDER — CLOPIDOGREL BISULFATE 300 MG PO TABS
ORAL_TABLET | ORAL | Status: AC
  Filled 2017-09-10: qty 2

## 2017-09-10 MED ORDER — IODIXANOL 320 MG/ML IV SOLN
INTRAVENOUS | Status: DC | PRN
  Administered 2017-09-10: 190 mL via INTRAVENOUS

## 2017-09-10 MED ORDER — FENTANYL CITRATE (PF) 100 MCG/2ML IJ SOLN
INTRAMUSCULAR | Status: DC | PRN
  Administered 2017-09-10: 25 ug via INTRAVENOUS

## 2017-09-10 MED ORDER — SODIUM CHLORIDE 0.9% FLUSH: 3.0000 mL | Freq: Two times a day (BID) | INTRAVENOUS | Status: DC

## 2017-09-10 MED ORDER — VERAPAMIL HCL 2.5 MG/ML IV SOLN
INTRAVENOUS | Status: AC
  Filled 2017-09-10: qty 2

## 2017-09-10 MED ORDER — NITROGLYCERIN IN D5W 200-5 MCG/ML-% IV SOLN
INTRAVENOUS | Status: AC
  Filled 2017-09-10: qty 250

## 2017-09-10 MED ORDER — ASPIRIN 81 MG PO CHEW
81.0000 mg | CHEWABLE_TABLET | ORAL | Status: AC
  Administered 2017-09-10: 81 mg via ORAL

## 2017-09-10 MED ORDER — HEPARIN SODIUM (PORCINE) 1000 UNIT/ML IJ SOLN
INTRAMUSCULAR | Status: DC | PRN
  Administered 2017-09-10: 4000 [IU] via INTRAVENOUS
  Administered 2017-09-10: 2000 [IU] via INTRAVENOUS
  Administered 2017-09-10: 10000 [IU] via INTRAVENOUS
  Administered 2017-09-10: 2000 [IU] via INTRAVENOUS

## 2017-09-10 MED ORDER — SODIUM CHLORIDE 0.9% FLUSH: 3.0000 mL | INTRAVENOUS | Status: DC | PRN

## 2017-09-10 MED ORDER — ASPIRIN 81 MG PO CHEW
CHEWABLE_TABLET | ORAL | Status: AC
  Filled 2017-09-10: qty 1

## 2017-09-10 MED ORDER — CLOPIDOGREL BISULFATE 300 MG PO TABS
ORAL_TABLET | ORAL | Status: DC | PRN
  Administered 2017-09-10: 600 mg via ORAL

## 2017-09-10 MED ORDER — LIDOCAINE HCL (PF) 1 % IJ SOLN
INTRAMUSCULAR | Status: DC | PRN
  Administered 2017-09-10: 18 mL

## 2017-09-10 MED ORDER — CLOPIDOGREL BISULFATE 75 MG PO TABS: 75.0000 mg | ORAL_TABLET | Freq: Every day | ORAL | 11 refills | Status: DC

## 2017-09-10 MED ORDER — FENTANYL CITRATE (PF) 100 MCG/2ML IJ SOLN
INTRAMUSCULAR | Status: AC
  Filled 2017-09-10: qty 2

## 2017-09-10 MED ORDER — HEPARIN (PORCINE) IN NACL 2-0.9 UNIT/ML-% IJ SOLN
INTRAMUSCULAR | Status: AC
  Filled 2017-09-10: qty 1000

## 2017-09-10 MED ORDER — SODIUM CHLORIDE 0.9 % IV SOLN
INTRAVENOUS | Status: DC
  Administered 2017-09-10: 07:00:00 via INTRAVENOUS

## 2017-09-10 MED ORDER — VIPERSLIDE LUBRICANT OPTIME
TOPICAL | Status: DC | PRN
  Administered 2017-09-10: 10:00:00 via SURGICAL_CAVITY

## 2017-09-10 MED ORDER — LIDOCAINE HCL 1 % IJ SOLN
INTRAMUSCULAR | Status: AC
  Filled 2017-09-10: qty 20

## 2017-09-10 SURGICAL SUPPLY — 36 items
BALLN CHOCOLATE 4.0X120X135 (BALLOONS) ×4
BALLN COYOTE ES OTW 2X40X144 (BALLOONS) ×4
BALLN COYOTE ES OTW 3X40X145 (BALLOONS) ×4
BALLN LUTONIX 5X150X130 (BALLOONS) ×4
BALLN STERLING OTW 5X100X135 (BALLOONS) ×4
BALLOON CHOCOLATE 4.0X120X135 (BALLOONS) ×3 IMPLANT
BALLOON COYOTE ES OTW 2X40X144 (BALLOONS) ×3 IMPLANT
BALLOON COYOTE ES OTW 3X40X145 (BALLOONS) ×3 IMPLANT
BALLOON LUTONIX 5X150X130 (BALLOONS) ×3 IMPLANT
BALLOON STERLING OTW 5X100X135 (BALLOONS) ×3 IMPLANT
CATH OMNI FLUSH 5F 65CM (CATHETERS) ×4 IMPLANT
COVER PRB 48X5XTLSCP FOLD TPE (BAG) ×3 IMPLANT
COVER PROBE 5X48 (BAG) ×1
CROWN STEALTH MICRO-30 1.25MM (CATHETERS) ×4 IMPLANT
DEVICE CLOSURE MYNXGRIP 6/7F (Vascular Products) ×4 IMPLANT
GUIDEWIRE ANGLED .035X260CM (WIRE) ×4 IMPLANT
KIT ENCORE 26 ADVANTAGE (KITS) ×4 IMPLANT
KIT ESSENTIALS PG (KITS) ×4 IMPLANT
KIT PV (KITS) ×4 IMPLANT
LUBRICANT VIPERSLIDE CORONARY (MISCELLANEOUS) ×4 IMPLANT
SHEATH PINNACLE 5F 10CM (SHEATH) ×4 IMPLANT
SHEATH PINNACLE 6F 10CM (SHEATH) ×4 IMPLANT
SHEATH PINNACLE MP 6F 45CM (SHEATH) ×4 IMPLANT
STENT SUPERA 5.0X120X120 (Permanent Stent) ×4 IMPLANT
STENT SUPERA 5.0X80X120 (Permanent Stent) ×4 IMPLANT
STOPCOCK MORSE 400PSI 3WAY (MISCELLANEOUS) ×4 IMPLANT
SYRINGE MEDRAD AVANTA MACH 7 (SYRINGE) ×4 IMPLANT
TAPE VIPERTRACK RADIOPAQ (MISCELLANEOUS) ×3 IMPLANT
TAPE VIPERTRACK RADIOPAQUE (MISCELLANEOUS) ×1
TRANSDUCER W/STOPCOCK (MISCELLANEOUS) ×4 IMPLANT
TRAY PV CATH (CUSTOM PROCEDURE TRAY) ×4 IMPLANT
TUBING CIL FLEX 10 FLL-RA (TUBING) ×4 IMPLANT
WIRE BENTSON .035X145CM (WIRE) ×4 IMPLANT
WIRE RUNTHROUGH .014X300CM (WIRE) ×4 IMPLANT
WIRE SPARTACORE .014X300CM (WIRE) ×4 IMPLANT
WIRE VIPER WIRECTO 0.014 (WIRE) ×4 IMPLANT

## 2017-09-10 NOTE — Discharge Instructions (Signed)

## 2017-09-10 NOTE — Progress Notes (Signed)
   Subjective:  52 year old male with a history of DM presents today for evaluation of a new ulceration that developed on the left third and fourth toes approximately 1-2 days ago.  Patient states the left foot and ankle pain started approximately 4 days ago.  He believes that the ulcer started off as a blister between the toes when he cut the tip of his toe using toenail clippers while cutting his nails.  He has been using iodine and keeping the wound clean.  He presents today for further treatment and evaluation   Objective/Physical Exam General: The patient is alert and oriented x3 in no acute distress.  Dermatology:  Wound #1 noted to the third interspace left foot measuring approximately 1.0 x 0.5 x 0.2 cm (LxWxD).  There is some peri-ulceration maceration noted.  No malodor noted.  Wound base appears to be somewhat granular.  There is some discoloration also noted locally around the third webspace just proximal and dorsal.  There is a minimal amount of serous drainage noted.  Vascular: Diminished pedal pulses bilaterally. No significant edema or erythema noted. Capillary refill appears to be slightly delayed.  Neurological: Epicritic and protective threshold absent bilaterally.   Musculoskeletal Exam: Range of motion within normal limits to all pedal and ankle joints bilateral. Muscle strength 5/5 in all groups bilateral.   Assessment: #1  Ulcer third webspace left foot secondary to diabetes mellitus secondary to diabetes mellitus #2 diabetes mellitus w/ peripheral neuropathy   Plan of Care:  #1 Patient was evaluated. #2 medically necessary excisional debridement including subcutaneous tissue was performed using a tissue nipper and a chisel blade. Excisional debridement of all the necrotic nonviable tissue down to healthy bleeding viable tissue was performed with post-debridement measurements same as pre-. #3 the wound was cleansed and dry sterile dressing applied. #4  Due to the  diminished pedal pulses with a delayed capillary refill I am going to order some arterial Dopplers for the left lower extremity. #5 prescription for doxycycline 100 mg #20 #6 recommend Betadine with dry sterile dressing applied daily to the ulceration site #7 continue wearing postoperative shoe #8 return to clinic in 3 weeks  Edrick Kins, DPM [facility]  Dr. Edrick Kins, [place]                                        [place], [place] [address]                Office [phone]  Fax [phone]

## 2017-09-10 NOTE — Interval H&P Note (Signed)
History and Physical Interval Note:  09/10/2017 8:40 AM  [name]  has presented today for surgery, with the diagnosis of pad  The various methods of treatment have been discussed with the patient and family. After consideration of risks, benefits and other options for treatment, the patient has consented to  Procedure(s): ABDOMINAL AORTOGRAM W/LOWER EXTREMITY (N/A) as a surgical intervention .  The patient's history has been reviewed, patient examined, no change in status, stable for surgery.  I have reviewed the patient's chart and labs.  Questions were answered to the patient's satisfaction.     Kathlyn Sacramento

## 2017-09-11 MED FILL — Lidocaine HCl Local Inj 1%: INTRAMUSCULAR | Qty: 20 | Status: AC

## 2017-09-11 MED FILL — Heparin Sodium (Porcine) 2 Unit/ML in Sodium Chloride 0.9%: INTRAMUSCULAR | Qty: 1000 | Status: AC

## 2017-09-15 ENCOUNTER — Other Ambulatory Visit: Payer: Self-pay

## 2017-09-15 ENCOUNTER — Telehealth: Payer: Self-pay | Admitting: *Deleted

## 2017-09-15 DIAGNOSIS — I739 Peripheral vascular disease, unspecified: Secondary | ICD-10-CM

## 2017-09-15 NOTE — Telephone Encounter (Signed)
Pt needs ABI and LEA this week at [facility] office.  Routed to scheduling to contact the patient for an appointment the week of February 25.

## 2017-09-15 NOTE — Telephone Encounter (Signed)
Spoke to the patient. He stated that he would prefer to have his tests done at [facility] in [place] rather than in [place]. Message sent to [facility] and it will be take care of for him.

## 2017-09-15 NOTE — Telephone Encounter (Signed)
Left a message to call back. The patient needs to have a ABI and lower extremity duplex scheduled. Need to know if the patient prefers to have these done at [facility] or [place].

## 2017-09-16 ENCOUNTER — Encounter: Payer: Self-pay | Admitting: Podiatry

## 2017-09-16 ENCOUNTER — Ambulatory Visit: Payer: Medicare Other | Admitting: Podiatry

## 2017-09-16 DIAGNOSIS — E0842 Diabetes mellitus due to underlying condition with diabetic polyneuropathy: Secondary | ICD-10-CM | POA: Diagnosis not present

## 2017-09-16 DIAGNOSIS — I96 Gangrene, not elsewhere classified: Secondary | ICD-10-CM

## 2017-09-16 NOTE — Telephone Encounter (Signed)
Left msg to confirm appt on Friday 3/1 at 3

## 2017-09-16 NOTE — Patient Instructions (Signed)
Pre-Operative Instructions  Congratulations, you have decided to take an important step towards improving your quality of life.  You can be assured that the doctors and staff at [facility] will be with you every step of the way.  Here are some important things you should know:  1. Plan to be at the surgery center/hospital at least 1 (one) hour prior to your scheduled time, unless otherwise directed by the surgical center/hospital staff.  You must have a responsible adult accompany you, remain during the surgery and drive you home.  Make sure you have directions to the surgical center/hospital to ensure you arrive on time. 2. If you are having surgery at [facility] or [facility], you will need a copy of your medical history and physical form from your family physician within one month prior to the date of surgery. We will give you a form for your primary physician to complete.  3. We make every effort to accommodate the date you request for surgery.  However, there are times where surgery dates or times have to be moved.  We will contact you as soon as possible if a change in schedule is required.   4. No aspirin/ibuprofen for one week before surgery.  If you are on aspirin, any non-steroidal anti-inflammatory medications (Mobic, Aleve, Ibuprofen) should not be taken seven (7) days prior to your surgery.  You make take Tylenol for pain prior to surgery.  5. Medications - If you are taking daily heart and blood pressure medications, seizure, reflux, allergy, asthma, anxiety, pain or diabetes medications, make sure you notify the surgery center/hospital before the day of surgery so they can tell you which medications you should take or avoid the day of surgery. 6. No food or drink after midnight the night before surgery unless directed otherwise by surgical center/hospital staff. 7. No alcoholic beverages 24-hours prior to surgery.  No smoking 24-hours prior or 24-hours after  surgery. 8. Wear loose pants or shorts. They should be loose enough to fit over bandages, boots, and casts. 9. Don't wear slip-on shoes. Sneakers are preferred. 10. Bring your boot with you to the surgery center/hospital.  Also bring crutches or a walker if your physician has prescribed it for you.  If you do not have this equipment, it will be provided for you after surgery. 11. If you have not been contacted by the surgery center/hospital by the day before your surgery, call to confirm the date and time of your surgery. 12. Leave-time from work may vary depending on the type of surgery you have.  Appropriate arrangements should be made prior to surgery with your employer. 13. Prescriptions will be provided immediately following surgery by your doctor.  Fill these as soon as possible after surgery and take the medication as directed. Pain medications will not be refilled on weekends and must be approved by the doctor. 14. Remove nail polish on the operative foot and avoid getting pedicures prior to surgery. 15. Wash the night before surgery.  The night before surgery wash the foot and leg well with water and the antibacterial soap provided. Be sure to pay special attention to beneath the toenails and in between the toes.  Wash for at least three (3) minutes. Rinse thoroughly with water and dry well with a towel.  Perform this wash unless told not to do so by your physician.  Enclosed: 1 Ice pack (please put in freezer the night before surgery)   1 Hibiclens skin cleaner     Pre-op instructions  If you have any questions regarding the instructions, please do not hesitate to call our office.  [facility]: [address], [place], [place] [address] -- [phone]  [place]: [address] [place], [place] [address] -- [phone]  [place]: [address]  [place], [place] [address] -- [phone]  [place]: [address], [address], [place], [place] [address] -- [phone]  Website:  [email or web address] 

## 2017-09-17 ENCOUNTER — Encounter (INDEPENDENT_AMBULATORY_CARE_PROVIDER_SITE_OTHER): Payer: Medicare Other

## 2017-09-18 ENCOUNTER — Encounter: Payer: Self-pay | Admitting: Podiatry

## 2017-09-18 DIAGNOSIS — M21072 Valgus deformity, not elsewhere classified, left ankle: Secondary | ICD-10-CM | POA: Diagnosis not present

## 2017-09-18 DIAGNOSIS — M89572 Osteolysis, left ankle and foot: Secondary | ICD-10-CM | POA: Diagnosis not present

## 2017-09-19 DIAGNOSIS — R0989 Other specified symptoms and signs involving the circulatory and respiratory systems: Secondary | ICD-10-CM

## 2017-09-23 ENCOUNTER — Ambulatory Visit: Payer: Medicare Other | Admitting: Cardiovascular Disease

## 2017-09-24 NOTE — Progress Notes (Signed)
   HPI: 52 year old male with a history of diabetes mellitus and critical limb ischemia to the left lower extremity presents today for follow-up evaluation regarding ischemia to the left forefoot.  Patient states that since the last visit on 09/02/2017 his toes have become completely necrotic.  Patient had stents placed and revascularization performed on 09/10/2017.  Patient currently experiences 0 pain or discomfort.  He presents today knowing that he likely need surgical amputation.  Past Medical History:  Diagnosis Date  . Chronic kidney disease   . Coronary artery disease   . Diabetes mellitus without complication ([place])   . Hyperlipidemia   . Hypertension      Physical Exam: General: The patient is alert and oriented x3 in no acute distress.  Dermatology: Ischemic gangrenous changes noted to digits 3 and 4 of the left foot extending proximal to the midfoot.  No significant malodor noted.  No significant drainage noted.  Purulent integrity is intact.  Vascular: Diminished pedal pulses specifically to the left lower extremity. No edema or erythema noted.  Toes are cold to touch left lower extremity  Neurological: Epicritic and protective threshold absent bilaterally.   Musculoskeletal Exam: Range of motion within normal limits to all pedal and ankle joints bilateral. Muscle strength 5/5 in all groups bilateral.   Assessment: -Critical limb ischemia left lower extremity -Ischemic gangrenous changes to digits 3 and 4 of the left lower extremity   Plan of Care:  -Patient was evaluated today. -Based on the patient's presentation of the ischemia the patient would likely benefit from a transmetatarsal amputation of the left forefoot.  Surgery would consist of transmetatarsal amputation left foot with tendo Achilles lengthening left foot to offload pressure from the forefoot. -All possible complications and details the procedure were explained.  All patient questions were answered.  No  guarantees were expressed or implied.  The patient understands that this is a circulatory issue and postsurgically he would need adequate blood flow to get the incision to heal. -Return to clinic 1 week postop  Edrick Kins, DPM [facility]  Dr. Edrick Kins, DPM    [address], [place] [address]                Office [phone]  Fax [phone]

## 2017-09-25 ENCOUNTER — Encounter: Payer: Self-pay | Admitting: Podiatry

## 2017-09-26 ENCOUNTER — Telehealth: Payer: Self-pay | Admitting: Cardiovascular Disease

## 2017-09-26 ENCOUNTER — Ambulatory Visit (INDEPENDENT_AMBULATORY_CARE_PROVIDER_SITE_OTHER): Payer: Medicare Other | Admitting: Podiatry

## 2017-09-26 ENCOUNTER — Encounter: Payer: Self-pay | Admitting: Podiatry

## 2017-09-26 ENCOUNTER — Ambulatory Visit (INDEPENDENT_AMBULATORY_CARE_PROVIDER_SITE_OTHER): Payer: Medicare Other

## 2017-09-26 VITALS — BP 159/90 | HR 80 | Temp 97.3°F

## 2017-09-26 DIAGNOSIS — M21072 Valgus deformity, not elsewhere classified, left ankle: Secondary | ICD-10-CM | POA: Diagnosis not present

## 2017-09-26 DIAGNOSIS — Z89432 Acquired absence of left foot: Secondary | ICD-10-CM

## 2017-09-26 NOTE — Telephone Encounter (Signed)
I s/w patient who reports missing March 1 LEA and ABI as he had 5 toes partially amputated on February 28. He is agreeable to reschedule testing. Transferred to [place] in scheduling.

## 2017-09-26 NOTE — Progress Notes (Signed)
DOS 09/18/17 Transmetatarsal amputation Lt,  tendo achilles lengthening Lt

## 2017-09-26 NOTE — Telephone Encounter (Signed)
Patient s/p urgent abdominal aortogram w/revascularization on Feb 20. He did not show for March 1 LEA and ABI. I have left a message on pt's voice mail to contact the office as he needs this ASAP.

## 2017-09-29 NOTE — Progress Notes (Signed)
   Subjective:  Patient presents today status post TMA and TAL left. DOS: 09/18/17. He states he is doing well. He states the incision looks good. He denies any new complaints at this time. Patient is here for further evaluation and treatment.    Past Medical History:  Diagnosis Date  . Chronic kidney disease   . Coronary artery disease   . Diabetes mellitus without complication ([place])   . Hyperlipidemia   . Hypertension       Objective/Physical Exam Neurovascular status intact.  Skin incisions appear to be well coapted with sutures and staples intact. No sign of infectious process noted. No dehiscence. No active bleeding noted. Moderate edema noted to the surgical extremity.  Radiographic Exam:  Osteotomies sites appear to be stable with routine healing.  Assessment: 1. s/p TMA and TAL left. DOS: 09/18/17   Plan of Care:  1. Patient was evaluated. X-rays reviewed 2. Dry sterile dressing applied. Continue nonweightbearing in CAM boot.  3. Finish taking Doxycycline as directed.  4. Return to clinic in one week.    Edrick Kins, DPM [facility]  Dr. Edrick Kins, [place] [place]                                        [place], [place] [address]                Office [phone]  Fax [phone]

## 2017-10-01 ENCOUNTER — Telehealth: Payer: Self-pay | Admitting: Cardiovascular Disease

## 2017-10-01 NOTE — Telephone Encounter (Signed)
Patient missed March 1 LEA/ABI s/p abdominal aortogram w/stent placement. He reports he had f/u w/podiatry after toe amputation. He was rescheduled for March 27 but needs sooner testing.  I have left a detailed voice mail message to contact the office for 3/15, 4pm testing.

## 2017-10-02 NOTE — Telephone Encounter (Signed)
S/w patient. He is unable to make an appointment for tomorrow afternoon because he has an appointment with his surgeon at that time. I was told by scheduler to check with [facility] about another potential time and date to add this patient in. S/w Miss and she will get back to me about a time. Patient aware I will call him back once we have an appointment.

## 2017-10-03 ENCOUNTER — Ambulatory Visit (INDEPENDENT_AMBULATORY_CARE_PROVIDER_SITE_OTHER): Payer: Medicare Other | Admitting: Podiatry

## 2017-10-03 DIAGNOSIS — Z89432 Acquired absence of left foot: Secondary | ICD-10-CM

## 2017-10-03 NOTE — Progress Notes (Signed)
   Subjective:  Patient presents today status post TMA and TAL left. DOS: 09/18/17. He states he is doing well. He denies any complaints or pain. Patient is here for further evaluation and treatment.    Past Medical History:  Diagnosis Date  . Chronic kidney disease   . Coronary artery disease   . Diabetes mellitus without complication ([place])   . Hyperlipidemia   . Hypertension       Objective/Physical Exam Neurovascular status intact.  Skin incisions appear to be well coapted with sutures and staples intact. No sign of infectious process noted. No dehiscence. No active bleeding noted. Moderate edema noted to the surgical extremity.  Assessment: 1. s/p TMA and TAL left. DOS: 09/18/17   Plan of Care:  1. Patient was evaluated. 2. Dry sterile dressing applied. Continue weightbearing in CAM boot.  3. Has an appointment with Dr. Fletcher Anon, vascular on 10/10/17.  4. Return to clinic in one week for suture/staple removal.    Edrick Kins, DPM [facility]  Dr. Edrick Kins, [place]                                        [place], [place] [address]                Office [phone]  Fax [phone]

## 2017-10-06 NOTE — Telephone Encounter (Signed)
Scheduled for LEA/ABI on March 19

## 2017-10-07 ENCOUNTER — Ambulatory Visit (INDEPENDENT_AMBULATORY_CARE_PROVIDER_SITE_OTHER): Payer: Medicare Other

## 2017-10-07 DIAGNOSIS — I739 Peripheral vascular disease, unspecified: Secondary | ICD-10-CM | POA: Diagnosis not present

## 2017-10-10 ENCOUNTER — Ambulatory Visit: Payer: Medicare Other | Admitting: Cardiovascular Disease

## 2017-10-10 ENCOUNTER — Encounter: Payer: Self-pay | Admitting: Cardiovascular Disease

## 2017-10-10 VITALS — BP 128/70 | HR 94 | Ht 72.0 in | Wt 250.0 lb

## 2017-10-10 DIAGNOSIS — I998 Other disorder of circulatory system: Secondary | ICD-10-CM | POA: Diagnosis not present

## 2017-10-10 DIAGNOSIS — I739 Peripheral vascular disease, unspecified: Secondary | ICD-10-CM | POA: Diagnosis not present

## 2017-10-10 DIAGNOSIS — I70229 Atherosclerosis of native arteries of extremities with rest pain, unspecified extremity: Secondary | ICD-10-CM

## 2017-10-10 DIAGNOSIS — I10 Essential (primary) hypertension: Secondary | ICD-10-CM | POA: Diagnosis not present

## 2017-10-10 DIAGNOSIS — E785 Hyperlipidemia, unspecified: Secondary | ICD-10-CM

## 2017-10-10 NOTE — Progress Notes (Signed)
Cardiology Office Note   Date:  10/10/2017   ID:  Ralph Peterson, DOB 1965/10/22, MRN [id number]  PCP:  [facility]  Cardiologist:   Kathlyn Sacramento, MD   Chief Complaint  Patient presents with  . other    s/p transmetatarsal amputation of left foot & PV cath with stent placement. Meds reviewed by the pt. verbally. "doing well."       History of Present Illness: Ralph Peterson is a 52 y.o. male who is here today for a follow-up visit regarding peripheral arterial disease.   The patient has known history of end-stage renal disease on hemodialysis for the last 5 years.  He is on the [facility] list at [facility].  He has other comorbidities include peripheral arterial disease, hypertension, diabetes mellitus and hyperlipidemia.  He quit smoking in 2010. He developed critical limb ischemia on the right side in 2017 and he was treated at that time by Dr. Delana Meyer.  He had drug-coated balloon angioplasty to the right SFA, popliteal artery and peroneal artery.  He subsequently underwent amputation of third toe with good healing.  He was seen recently for left leg claudication as well as gangrene of the left toes. He underwent noninvasive vascular study which showed noncompressible vessels.  Right lower extremity vessels appeared patent.  On the left side, the popliteal artery was noted to be occluded.  I proceeded with angiography which  showed no significant aortoiliac disease.  The left SFA was occluded into the popliteal artery with reconstitution below the knee and subtotal occlusion of the left anterior tibial artery.  The peroneal artery was occluded.  I performed successful complex orbital atherectomy of the distal left SFA and popliteal arteries followed by drug-coated balloon angioplasty.  2 Supear self-expanding stents were placed due to  flow-limiting dissection.  I also performed balloon angioplasty of the left anterior tibial artery. The patient  subsequently underwent transmetatarsal amputation and has been doing well since then with no significant pain.  The site is healing nicely.  Past Medical History:  Diagnosis Date  . Chronic kidney disease   . Coronary artery disease   . Diabetes mellitus without complication ([place])   . Hyperlipidemia   . Hypertension     Past Surgical History:  Procedure Laterality Date  . ABDOMINAL AORTOGRAM N/A 09/10/2017   Procedure: ABDOMINAL AORTOGRAM;  Surgeon: Wellington Hampshire, MD;  Location: [facility];  Service: Cardiovascular;  Laterality: N/A;  . CARDIAC CATHETERIZATION  2012   [facility]; X1 stent 2.5 x 33 mm xience stent Distal TCA  . LOWER EXTREMITY ANGIOGRAPHY Bilateral 09/10/2017   Procedure: Lower Extremity Angiography;  Surgeon: Wellington Hampshire, MD;  Location: [facility];  Service: Cardiovascular;  Laterality: Bilateral;  . PERIPHERAL VASCULAR ATHERECTOMY Left 09/10/2017   Procedure: PERIPHERAL VASCULAR ATHERECTOMY;  Surgeon: Wellington Hampshire, MD;  Location: [facility];  Service: Cardiovascular;  Laterality: Left;  SFA/POPLITEAL  . PERIPHERAL VASCULAR BALLOON ANGIOPLASTY Left 09/10/2017   Procedure: PERIPHERAL VASCULAR BALLOON ANGIOPLASTY;  Surgeon: Wellington Hampshire, MD;  Location: [facility];  Service: Cardiovascular;  Laterality: Left;  ANTERIAL TIBIAL  . PERIPHERAL VASCULAR CATHETERIZATION Right 03/26/2016   Procedure: Lower Extremity Angiography;  Surgeon: Katha Cabal, MD;  Location: [facility];  Service: Cardiovascular;  Laterality: Right;  . PERIPHERAL VASCULAR INTERVENTION Left 09/10/2017   Procedure: PERIPHERAL VASCULAR INTERVENTION;  Surgeon: Wellington Hampshire, MD;  Location: [facility];  Service: Cardiovascular;  Laterality: Left;  SFA/POPLITEAL     Current Outpatient Medications  Medication Sig Dispense Refill  . amLODipine (NORVASC) 10 MG tablet Take 10 mg by mouth daily.     . ASPIRIN 81 PO 1 tablet  (81mg )  by mouth daily    . atorvastatin (LIPITOR) 10 MG tablet Take 10 mg by mouth See admin instructions. Only take on non dialysis days (Sun / Tues/ Thurs / Sat)    . carvedilol (COREG) 25 MG tablet Take 25 mg by mouth 2 (two) times daily with a meal.     . clobetasol cream (TEMOVATE) 2.56 % Apply 1 application topically 2 (two) times daily.    . clopidogrel (PLAVIX) 75 MG tablet Take 1 tablet (75 mg total) by mouth daily. 30 tablet 11  . escitalopram (LEXAPRO) 10 MG tablet Take 10 mg by mouth daily.     . fluticasone (FLONASE) 50 MCG/ACT nasal spray Place 2 sprays into both nostrils daily.    Marland Kitchen ibuprofen (ADVIL,MOTRIN) 200 MG tablet Take 200 mg by mouth every 6 (six) hours as needed.    Marland Kitchen lisinopril (PRINIVIL,ZESTRIL) 2.5 MG tablet Take 2.5 mg by mouth. Tuesday, Thursday , Saturday and Sunday on non dialysis.    Marland Kitchen sevelamer carbonate (RENVELA) 800 MG tablet Take 1,600-3,200 mg by mouth See admin instructions. Take 3200 mg with meals / 1600 mg with snacks    . vitamin C (ASCORBIC ACID) 500 MG tablet Take 500 mg by mouth daily.      No current facility-administered medications for this visit.     Allergies:   Penicillins    Social History:  The patient  reports that he has never smoked. He has never used smokeless tobacco. He reports that he does not drink alcohol or use drugs.   Family History:  The patient's family history includes Hypertension in his mother.    ROS:  Please see the history of present illness.   Otherwise, review of systems are positive for none.   All other systems are reviewed and negative.    PHYSICAL EXAM: VS:  BP 128/70 (BP Location: Left Arm, Patient Position: Sitting, Cuff Size: Normal)   Pulse 94   Ht 6' (1.829 m)   Wt 250 lb (113.4 kg)   BMI 33.91 kg/m  , BMI Body mass index is 33.91 kg/m. GEN: Well nourished, well developed, in no acute distress  HEENT: normal  Neck: no JVD, carotid bruits, or masses Cardiac: RRR; no , rubs, or gallops,no edema .  1 out of  6 systolic murmur at the base Respiratory:  clear to auscultation bilaterally, normal work of breathing GI: soft, nontender, nondistended, + BS MS: no deformity or atrophy  Skin: warm and dry, no rash Neuro:  Strength and sensation are intact Psych: euthymic mood, full affect Vascular: Femoral pulses are normal bilaterally.  No groin hematoma  EKG:  EKG is ordered today. The ekg ordered today demonstrates normal sinus rhythm with  right bundle branch block.   Recent Labs: 09/05/2017: Hemoglobin 11.6; Platelets 289 09/10/2017: BUN 65; Creatinine, Ser 10.26; Potassium 5.0; Sodium 133    Lipid Panel No results found for: CHOL, TRIG, HDL, CHOLHDL, VLDL, LDLCALC, LDLDIRECT    Wt Readings from Last 3 Encounters:  10/10/17 250 lb (113.4 kg)  09/10/17 250 lb (113.4 kg)  09/05/17 251 lb 4 oz (114 kg)        PAD Screen 09/05/2017  Previous PAD dx? Yes  Previous surgical procedure?  Yes  Dates of procedures Right leg balloon  Pain with walking? Yes  Subsides with rest? No  Feet/toe relief with dangling? No  Painful, non-healing ulcers? Yes  Extremities discolored? Yes      ASSESSMENT AND PLAN:  1.  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  Status post successful endovascular revascularization of the distal left SFA and popliteal arteries as well as anterior tibial artery.  Postprocedure duplex showed patent stents with very good flow all the way down to the foot.  The amputation site is healing nicely.  I am planning to keep him on long-term dual antiplatelet therapy.  Continue to follow-up with Dr. Amalia Hailey.   The plan is to repeat noninvasive vascular testing in 6 months from now with follow-up at that time.  I asked him to notify me if he encounters any issues in the meantime.  2.  Essential hypertension: Blood pressure is controlled on current medications.  3.  Hyperlipidemia: Continue treatment with atorvastatin.  4.  End-stage renal disease on  hemodialysis on Monday Wednesday Friday   Disposition:   FU with me in 6 months  Signed,  Kathlyn Sacramento, MD  10/10/2017 3:03 PM    [place]

## 2017-10-10 NOTE — Patient Instructions (Signed)
Medication Instructions:  Your physician recommends that you continue on your current medications as directed. Please refer to the Current Medication list given to you today.   Labwork: none  Testing/Procedures: IN 6 MONTHS PRIOR TO appointment.----Your physician has requested that you have an ankle brachial index (ABI). During this test an ultrasound and blood pressure cuff are used to evaluate the arteries that supply the arms and legs with blood. Allow thirty minutes for this exam. There are no restrictions or special instructions.  IN 6 MONTHS PRIOR TO appointment----Your physician has requested that you have a lower extremity arterial doppler- During this test, ultrasound is used to evaluate arterial blood flow in the legs. Allow approximately one hour for this exam.    Follow-Up: Your physician wants you to follow-up in: [place].  You will receive a reminder letter in the mail two months in advance. If you don't receive a letter, please call our office to schedule the follow-up appointment.  If you need a refill on your cardiac medications before your next appointment, please call your pharmacy.

## 2017-10-14 ENCOUNTER — Ambulatory Visit (INDEPENDENT_AMBULATORY_CARE_PROVIDER_SITE_OTHER): Payer: Medicare Other | Admitting: Podiatry

## 2017-10-14 ENCOUNTER — Encounter: Payer: Medicare Other | Admitting: Podiatry

## 2017-10-14 ENCOUNTER — Encounter: Payer: Self-pay | Admitting: Podiatry

## 2017-10-14 ENCOUNTER — Ambulatory Visit (INDEPENDENT_AMBULATORY_CARE_PROVIDER_SITE_OTHER): Payer: Medicare Other

## 2017-10-14 DIAGNOSIS — Z89432 Acquired absence of left foot: Secondary | ICD-10-CM | POA: Diagnosis not present

## 2017-10-14 NOTE — Addendum Note (Signed)
Addended by: Anselm Pancoast on: 10/14/2017 11:32 AM   Modules accepted: Orders

## 2017-10-15 MED ORDER — SULFAMETHOXAZOLE-TRIMETHOPRIM 800-160 MG PO TABS: 1.0000 | ORAL_TABLET | Freq: Two times a day (BID) | ORAL | 0 refills | Status: DC

## 2017-10-15 NOTE — Progress Notes (Signed)
   Subjective:  Patient presents today status post TMA and TAL left. DOS: 09/18/17. He states he is doing well. He denies any complaints or pain. Patient is here for further evaluation and treatment.  Since the last visit on 10/03/2017 he followed up with vascular, Dr. Fletcher Anon, on 10/10/2017 at which time arterial Dopplers were performed.  Patient states that the results were satisfactory and he has good blood flow to his surgical extremity.    Past Medical History:  Diagnosis Date  . Chronic kidney disease   . Coronary artery disease   . Diabetes mellitus without complication ([place])   . Hyperlipidemia   . Hypertension       Objective/Physical Exam Neurovascular status intact.  Skin incisions appear to be well coapted with sutures and staples intact. No sign of infectious process noted with exception of a small amount of serosanguineous drainage to the central portion of the amputation stump.  There is no erythema or malodor noted.  No dehiscence. No active bleeding noted. Moderate edema noted to the surgical extremity.  Assessment: 1. s/p TMA and TAL left. DOS: 09/18/17   Plan of Care:  1. Patient was evaluated.  Today sutures and staples were removed with exception of the central portion of the incision site 2. Dry sterile dressing applied. Continue weightbearing in CAM boot.  3.  Today we will prescribe Bactrim DS #28 more prophylactically just due to the suspicious drainage to the central incision site.  4.  Today cultures were taken of the drainage and sent to pathology for culture and sensitivity 5.  Return to clinic in 2 weeks  Edrick Kins, DPM [facility]  Dr. Edrick Kins, [place]                                        [place], [place] [address]                Office [phone]  Fax [phone]

## 2017-10-18 LAB — WOUND CULTURE

## 2017-10-28 ENCOUNTER — Ambulatory Visit: Payer: Medicare Other

## 2017-10-28 ENCOUNTER — Encounter: Payer: Self-pay | Admitting: Podiatry

## 2017-10-28 ENCOUNTER — Ambulatory Visit (INDEPENDENT_AMBULATORY_CARE_PROVIDER_SITE_OTHER): Payer: Medicare Other | Admitting: Podiatry

## 2017-10-28 DIAGNOSIS — Z89432 Acquired absence of left foot: Secondary | ICD-10-CM | POA: Diagnosis not present

## 2017-10-29 ENCOUNTER — Telehealth: Payer: Self-pay | Admitting: Podiatry

## 2017-10-29 NOTE — Telephone Encounter (Signed)
I just saw Dr. Amalia Hailey yesterday and he wanted me to let him know if I had any of the antibiotic cream left that he prescribed. I do not have any of that cream. So if he needs to go ahead and call me in another one, I would appreciate it. My number is [phone]. I will look forward to hearing from you. Thank you. Bye bye.

## 2017-10-29 NOTE — Progress Notes (Signed)
   Subjective:  Patient presents today status post TMA and TAL left. DOS: 09/18/17. He states he is doing well overall. He denies any pain. He denies any new complaints at this time. Patient is here for further evaluation and treatment.    Past Medical History:  Diagnosis Date  . Chronic kidney disease   . Coronary artery disease   . Diabetes mellitus without complication ([place])   . Hyperlipidemia   . Hypertension       Objective/Physical Exam Neurovascular status intact.  Skin incisions appear to be well coapted with sutures and staples intact. Dehiscence noted along the incision approximately 2 cm in length. Subcutaneous tissue exposed.   Assessment: 1. s/p TMA and TAL left. DOS: 09/18/17   Plan of Care:  1. Patient was evaluated.   2. Remaining sutures/staples removed. Dry sterile dressing applied.  3. Recommended using gentamicin cream daily with a Band-Aid.  4. Post op shoe dispensed.  5. Return to clinic in 2 weeks.   Edrick Kins, DPM [facility]  Dr. Edrick Kins, [place]                                        [place], [place] [address]                Office [phone]  Fax [phone]

## 2017-10-31 MED ORDER — GENTAMICIN SULFATE 0.1 % EX CREA: 1.0000 "application " | TOPICAL_CREAM | Freq: Three times a day (TID) | CUTANEOUS | 1 refills | Status: DC

## 2017-10-31 NOTE — Telephone Encounter (Signed)
Per Dr. Amalia Hailey, send in Gentamycin cream with 1 rf.  Script has been sent to pharmacy.   I called patient back and left message that he can pick up rx this afternoon.

## 2017-11-07 NOTE — Progress Notes (Signed)
This encounter was created in error - please disregard.

## 2017-11-11 ENCOUNTER — Encounter: Payer: Self-pay | Admitting: Podiatry

## 2017-11-11 ENCOUNTER — Ambulatory Visit (INDEPENDENT_AMBULATORY_CARE_PROVIDER_SITE_OTHER): Payer: Medicare Other

## 2017-11-11 ENCOUNTER — Ambulatory Visit (INDEPENDENT_AMBULATORY_CARE_PROVIDER_SITE_OTHER): Payer: Medicare Other | Admitting: Podiatry

## 2017-11-11 DIAGNOSIS — Z89432 Acquired absence of left foot: Secondary | ICD-10-CM

## 2017-11-13 ENCOUNTER — Ambulatory Visit: Payer: Medicare Other | Admitting: Orthotics

## 2017-11-13 DIAGNOSIS — R0989 Other specified symptoms and signs involving the circulatory and respiratory systems: Secondary | ICD-10-CM

## 2017-11-13 DIAGNOSIS — E0842 Diabetes mellitus due to underlying condition with diabetic polyneuropathy: Secondary | ICD-10-CM

## 2017-11-13 DIAGNOSIS — Z89432 Acquired absence of left foot: Secondary | ICD-10-CM

## 2017-11-13 NOTE — Progress Notes (Signed)
   Subjective:  Patient presents today status post TMA and TAL left. DOS: 09/18/17. He states he is doing much better. He reports that the incision is almost healed. He denies any new complaints at this time. Patient is here for further evaluation and treatment.   Past Medical History:  Diagnosis Date  . Chronic kidney disease   . Coronary artery disease   . Diabetes mellitus without complication ([place])   . Hyperlipidemia   . Hypertension       Objective/Physical Exam Neurovascular status intact.  Skin incisions appear to be well coapted. No sign of infectious process noted. No dehiscence. No active bleeding noted. Moderate edema noted to the surgical extremity.   Radiographic Exam:  Osteotomies sites appear to be stable with routine healing.  Assessment: 1. s/p TMA and TAL left. DOS: 09/18/17   Plan of Care:  1. Patient was evaluated. X-Rays reviewed.  2. Continue using gentamicin cream daily with a Band-Aid.  3. Appointment with Liliane Channel for DM shoes with toe filler.  4. Continue weightbearing in post op shoe.  5. Return to clinic in 3 weeks.   Edrick Kins, DPM [facility]  Dr. Edrick Kins, [address]                                        [place], [place] [address]                Office [phone]  Fax [phone]

## 2017-11-13 NOTE — Progress Notes (Signed)
Patient will get transmet toe filler left and 3 custom inserts RT; patient chose NB [id number] size 12W

## 2017-12-02 ENCOUNTER — Ambulatory Visit (INDEPENDENT_AMBULATORY_CARE_PROVIDER_SITE_OTHER): Payer: Medicare Other | Admitting: Podiatry

## 2017-12-02 ENCOUNTER — Encounter: Payer: Self-pay | Admitting: Podiatry

## 2017-12-02 DIAGNOSIS — Z89432 Acquired absence of left foot: Secondary | ICD-10-CM

## 2017-12-02 DIAGNOSIS — R0989 Other specified symptoms and signs involving the circulatory and respiratory systems: Secondary | ICD-10-CM

## 2017-12-02 DIAGNOSIS — E0842 Diabetes mellitus due to underlying condition with diabetic polyneuropathy: Secondary | ICD-10-CM

## 2017-12-04 NOTE — Progress Notes (Signed)
   Subjective:  Patient presents today status post TMA and TAL left. DOS: 09/18/17. He states he is doing well. He denies any pain or other complaints. Patient is here for further evaluation and treatment.   Past Medical History:  Diagnosis Date  . Chronic kidney disease   . Coronary artery disease   . Diabetes mellitus without complication ([place])   . Hyperlipidemia   . Hypertension       Objective/Physical Exam Neurovascular status intact.  Skin incisions appear to be well coapted and healed. No sign of infectious process noted. No dehiscence. No active bleeding noted.   Assessment: 1. s/p TMA and TAL left. DOS: 09/18/17 - healed   Plan of Care:  1. Patient was evaluated.  2. Patient waiting to pick up DM shoes with toe filler.  3. Continue wearing good shoes.  4. Return to clinic in 3 months.   Edrick Kins, DPM [facility]  Dr. Edrick Kins, [address]                                        [place], [place] [address]                Office [phone]  Fax [phone]

## 2017-12-18 ENCOUNTER — Telehealth: Payer: Self-pay | Admitting: Podiatry

## 2017-12-18 NOTE — Telephone Encounter (Signed)
Pt left message checking on his diabetic shoes/inserts.   I returned called and left message that his doctor Dr Bartolo Darter has not signed off on his paperwork for them to send the shoes/inserts yet

## 2017-12-19 NOTE — Telephone Encounter (Signed)
Checked the fax number for Dr Bartolo Darter and it was incorrect so I faxed the diabetic shoe paperwork yesterday manually and got confirmation it went.  I have called pt and left message making him aware of this.

## 2018-02-03 ENCOUNTER — Ambulatory Visit (INDEPENDENT_AMBULATORY_CARE_PROVIDER_SITE_OTHER): Payer: Medicare Other | Admitting: Podiatry

## 2018-02-03 DIAGNOSIS — E0842 Diabetes mellitus due to underlying condition with diabetic polyneuropathy: Secondary | ICD-10-CM

## 2018-02-03 DIAGNOSIS — Z89432 Acquired absence of left foot: Secondary | ICD-10-CM | POA: Diagnosis not present

## 2018-02-03 DIAGNOSIS — R0989 Other specified symptoms and signs involving the circulatory and respiratory systems: Secondary | ICD-10-CM

## 2018-02-15 NOTE — Progress Notes (Signed)
Patient presents today to pick up his diabetic shoes.  Patient has no other complaints today.  Diabetic shoes were dispensed.  Return to clinic as needed  Edrick Kins, DPM [facility]  Dr. Edrick Kins, DPM    [address], [place] [address]                Office [phone]  Fax [phone]

## 2018-02-20 ENCOUNTER — Telehealth: Payer: Self-pay | Admitting: Podiatry

## 2018-02-20 NOTE — Telephone Encounter (Signed)
Pt left voicemail stating the inserts for the diabetic shoe on the left foot (filler) is rubbing on outside of foot. Does he need an appt or just drop it off to be adjusted.   I returned call and left message he needs an appt so Liliane Channel can see what is going on. If possible to come to [facility] office because Liliane Channel has more equipment to make adjustments in the office but if he can't just make the appt in [place] can take the insert and bring to [facility] office.

## 2018-03-06 ENCOUNTER — Ambulatory Visit (INDEPENDENT_AMBULATORY_CARE_PROVIDER_SITE_OTHER): Payer: Medicare Other | Admitting: Podiatry

## 2018-03-06 ENCOUNTER — Encounter: Payer: Self-pay | Admitting: Podiatry

## 2018-03-06 DIAGNOSIS — E0842 Diabetes mellitus due to underlying condition with diabetic polyneuropathy: Secondary | ICD-10-CM | POA: Diagnosis not present

## 2018-03-06 DIAGNOSIS — Z89432 Acquired absence of left foot: Secondary | ICD-10-CM

## 2018-03-06 NOTE — Progress Notes (Signed)
   Subjective:  Patient presents today status post TMA and TAL left. DOS: 09/18/17. He states he is doing very well. He states Liliane Channel needs to modify his orthotics but otherwise denies any complaints. Patient is here for further evaluation and treatment.   Past Medical History:  Diagnosis Date  . Chronic kidney disease   . Coronary artery disease   . Diabetes mellitus without complication ([place])   . Hyperlipidemia   . Hypertension       Objective/Physical Exam Neurovascular status intact.  Skin incisions appear to be well coapted and healed. No sign of infectious process noted. No dehiscence. No active bleeding noted.   Assessment: 1. s/p TMA and TAL left. DOS: 09/18/17 - healed   Plan of Care:  1. Patient was evaluated.  2. Appointment with Liliane Channel for DM prosthetic modification.  3. Continue wearing DM shoes.  4. Return to clinic as needed.   Edrick Kins, DPM [facility]  Dr. Edrick Kins, [address]                                        [place], [place] [address]                Office [phone]  Fax [phone]

## 2018-03-31 ENCOUNTER — Other Ambulatory Visit: Payer: Medicare Other | Admitting: Orthotics

## 2018-05-13 ENCOUNTER — Ambulatory Visit (INDEPENDENT_AMBULATORY_CARE_PROVIDER_SITE_OTHER): Payer: Medicare Other

## 2018-05-13 DIAGNOSIS — I739 Peripheral vascular disease, unspecified: Secondary | ICD-10-CM

## 2018-05-13 DIAGNOSIS — I998 Other disorder of circulatory system: Secondary | ICD-10-CM

## 2018-05-13 DIAGNOSIS — I70229 Atherosclerosis of native arteries of extremities with rest pain, unspecified extremity: Secondary | ICD-10-CM

## 2018-05-20 ENCOUNTER — Telehealth: Payer: Self-pay | Admitting: *Deleted

## 2018-05-20 DIAGNOSIS — I739 Peripheral vascular disease, unspecified: Secondary | ICD-10-CM

## 2018-05-20 NOTE — Telephone Encounter (Signed)
-----   Message from Wellington Hampshire, MD sent at 05/19/2018  5:30 PM EDT ----- Patent stents in the left leg with moderate scarring.  The blood flow seems to be good overall.  He should keep his follow-up appointment with me next month. Repeat same studies in 6 months.

## 2018-05-20 NOTE — Telephone Encounter (Signed)
No answer. Left message to call back.  Repeat orders entered.

## 2018-05-22 NOTE — Telephone Encounter (Signed)
Results called to pt. Pt verbalized understanding. He is aware of upcoming appointment date and time. Repeat studies for 6 months entered.

## 2018-06-12 ENCOUNTER — Ambulatory Visit: Payer: Medicare Other | Admitting: Cardiovascular Disease

## 2018-06-15 ENCOUNTER — Telehealth: Payer: Self-pay | Admitting: Podiatry

## 2018-06-15 ENCOUNTER — Other Ambulatory Visit (INDEPENDENT_AMBULATORY_CARE_PROVIDER_SITE_OTHER): Payer: Self-pay | Admitting: Nurse Practitioner

## 2018-06-15 NOTE — Telephone Encounter (Signed)
Pt called requesting a Referral be sent over to [facility] for Dr. Lanier Ensign. Pt has seen Dr. Lanier Ensign previously for issues with blockages in veins in legs/feet. Pt believes he has another blockage and is needing referral to be seen asap.

## 2018-06-15 NOTE — Telephone Encounter (Signed)
I spoke with patient, he stated that he is having the same pains in his LLE as before the amputation.  He stated that his foot felt colder than his right and he is having some pain in his leg and it started 2 days ago.  I informed him that since he is established patient with Dr. Delana Meyer, he can call and schedule appt with them.  He verbalized understanding

## 2018-06-16 ENCOUNTER — Other Ambulatory Visit (INDEPENDENT_AMBULATORY_CARE_PROVIDER_SITE_OTHER): Payer: Self-pay | Admitting: Nurse Practitioner

## 2018-06-16 ENCOUNTER — Ambulatory Visit (INDEPENDENT_AMBULATORY_CARE_PROVIDER_SITE_OTHER): Payer: Medicare Other | Admitting: Nurse Practitioner

## 2018-06-16 ENCOUNTER — Encounter (INDEPENDENT_AMBULATORY_CARE_PROVIDER_SITE_OTHER): Payer: Self-pay

## 2018-06-16 ENCOUNTER — Ambulatory Visit (INDEPENDENT_AMBULATORY_CARE_PROVIDER_SITE_OTHER): Payer: Medicare Other

## 2018-06-16 ENCOUNTER — Encounter (INDEPENDENT_AMBULATORY_CARE_PROVIDER_SITE_OTHER): Payer: Self-pay | Admitting: Nurse Practitioner

## 2018-06-16 VITALS — BP 151/89 | HR 79 | Resp 18 | Ht 72.0 in | Wt 268.0 lb

## 2018-06-16 DIAGNOSIS — E785 Hyperlipidemia, unspecified: Secondary | ICD-10-CM

## 2018-06-16 DIAGNOSIS — M79605 Pain in left leg: Secondary | ICD-10-CM | POA: Diagnosis not present

## 2018-06-16 DIAGNOSIS — I70222 Atherosclerosis of native arteries of extremities with rest pain, left leg: Secondary | ICD-10-CM

## 2018-06-16 DIAGNOSIS — I10 Essential (primary) hypertension: Secondary | ICD-10-CM | POA: Diagnosis not present

## 2018-06-16 DIAGNOSIS — I7025 Atherosclerosis of native arteries of other extremities with ulceration: Secondary | ICD-10-CM | POA: Insufficient documentation

## 2018-06-16 DIAGNOSIS — I70219 Atherosclerosis of native arteries of extremities with intermittent claudication, unspecified extremity: Secondary | ICD-10-CM | POA: Insufficient documentation

## 2018-06-16 NOTE — Progress Notes (Signed)
Subjective:    Patient ID: Ralph Peterson, male    DOB: 03/20/1966, 52 y.o.   MRN: [id number] Chief Complaint  Patient presents with  . Follow-up    Arterial duplex f/u    HPI  Ralph Peterson is a 52 y.o. male that presents today with complaints of pain to his left anterior shin area as well as persistent coldness of his left foot.  Mr. Spieler has a history of previous bilateral transmetatarsal amputations.  He previously had percutaneous transluminal angioplasties done by Dr. Delana Meyer on 03/26/2016 of his right SFA, popliteal and peroneal arteries.  On September 10, 2017 he had a left distal atherectomy with percutaneous transluminal angioplasty and stent placement of the left distal SFA and popliteal arteries done by Dr. Fletcher Anon.   The patient was in his usual state of health when several days ago he began to have the pain in his left anterior shin area.  He also noticed that at this time his foot became profoundly cold.  He states the pain is greatest when he is ambulating and lessens when he is able to sit and relax.  The patient denies any open wounds or ulcerations at this time.  Patient denies any chest pain or shortness of breath.  Patient denies any fever, chills, nausea, vomiting.    The patient underwent a lower extremity arterial duplex today which revealed triphasic/biphasic waveforms to the level of the deep femoral artery, transitioning to monophasic at the level of the SFA.  The stent placed in the popliteal artery is occluded.  There are some dampened monophasic waveforms in the tibial arteries, likely provided by collateralization.  This contrasts with the left lower extremity duplex done on 05/14/2018 and Dr. Tyrell Antonio office, which showed a 71 to 99% stenosis distal SFA/proximal popliteal artery.  The popliteal stent revealed a 50 to 99% stenosis as well at that time.  Past Medical History:  Diagnosis Date  . Chronic kidney disease   . Coronary artery disease   .  Diabetes mellitus without complication ([place])   . Hyperlipidemia   . Hypertension     Past Surgical History:  Procedure Laterality Date  . ABDOMINAL AORTOGRAM N/A 09/10/2017   Procedure: ABDOMINAL AORTOGRAM;  Surgeon: Wellington Hampshire, MD;  Location: [facility];  Service: Cardiovascular;  Laterality: N/A;  . CARDIAC CATHETERIZATION  2012   [facility]; X1 stent 2.5 x 33 mm xience stent Distal TCA  . LOWER EXTREMITY ANGIOGRAPHY Bilateral 09/10/2017   Procedure: Lower Extremity Angiography;  Surgeon: Wellington Hampshire, MD;  Location: [facility];  Service: Cardiovascular;  Laterality: Bilateral;  . PERIPHERAL VASCULAR ATHERECTOMY Left 09/10/2017   Procedure: PERIPHERAL VASCULAR ATHERECTOMY;  Surgeon: Wellington Hampshire, MD;  Location: [facility];  Service: Cardiovascular;  Laterality: Left;  SFA/POPLITEAL  . PERIPHERAL VASCULAR BALLOON ANGIOPLASTY Left 09/10/2017   Procedure: PERIPHERAL VASCULAR BALLOON ANGIOPLASTY;  Surgeon: Wellington Hampshire, MD;  Location: [facility];  Service: Cardiovascular;  Laterality: Left;  ANTERIAL TIBIAL  . PERIPHERAL VASCULAR CATHETERIZATION Right 03/26/2016   Procedure: Lower Extremity Angiography;  Surgeon: Katha Cabal, MD;  Location: [facility];  Service: Cardiovascular;  Laterality: Right;  . PERIPHERAL VASCULAR INTERVENTION Left 09/10/2017   Procedure: PERIPHERAL VASCULAR INTERVENTION;  Surgeon: Wellington Hampshire, MD;  Location: [facility];  Service: Cardiovascular;  Laterality: Left;  SFA/POPLITEAL    Social History   Socioeconomic History  . Marital status: Single  Spouse name: Not on file  . Number of children: Not on file  . Years of education: Not on file  . Highest education level: Not on file  Occupational History  . Not on file  Social Needs  . Financial resource strain: Not on file  . Food insecurity:    Worry: Not on file    Inability: Not on file  . Transportation needs:     Medical: Not on file    Non-medical: Not on file  Tobacco Use  . Smoking status: Never Smoker  . Smokeless tobacco: Never Used  Substance and Sexual Activity  . Alcohol use: No    Frequency: Never  . Drug use: No  . Sexual activity: Not on file  Lifestyle  . Physical activity:    Days per week: Not on file    Minutes per session: Not on file  . Stress: Not on file  Relationships  . Social connections:    Talks on phone: Not on file    Gets together: Not on file    Attends religious service: Not on file    Active member of club or organization: Not on file    Attends meetings of clubs or organizations: Not on file    Relationship status: Not on file  . Intimate partner violence:    Fear of current or ex partner: Not on file    Emotionally abused: Not on file    Physically abused: Not on file    Forced sexual activity: Not on file  Other Topics Concern  . Not on file  Social History Narrative  . Not on file    Family History  Problem Relation Age of Onset  . Hypertension Mother     Allergies  Allergen Reactions  . Penicillins Rash    Rash is painful      Review of Systems   Review of Systems: Negative Unless Checked Constitutional: [] Weight loss  [] Fever  [] Chills Cardiac: [] Chest pain   []  Atrial Fibrillation  [] Palpitations   [] Shortness of breath when laying flat   [] Shortness of breath with exertion. Vascular:  [x] Pain in legs with walking   [x] Pain in legs with standing  [] History of DVT   [] Phlebitis   [] Swelling in legs   [] Varicose veins   [] Non-healing ulcers Pulmonary:   [] Uses home oxygen   [] Productive cough   [] Hemoptysis   [] Wheeze  [] COPD   [] Asthma Neurologic:  [] Dizziness   [] Seizures   [] History of stroke   [] History of TIA  [] Aphasia   [] Vissual changes   [] Weakness or numbness in arm   [] Weakness or numbness in leg Musculoskeletal:   [] Joint swelling   [] Joint pain   [] Low back pain  []  History of Knee Replacement Hematologic:  [] Easy bruising   [] Easy bleeding   [] Hypercoagulable state   [x] Anemic Gastrointestinal:  [] Diarrhea   [] Vomiting  [] Gastroesophageal reflux/heartburn   [] Difficulty swallowing. Genitourinary:  [x] Chronic kidney disease   [] Difficult urination  [] Anuric   [] Blood in urine Skin:  [] Rashes   [] Ulcers  Psychological:  [] History of anxiety   []  History of major depression  []  Memory Difficulties     Objective:   Physical Exam  BP (!) 151/89 (BP Location: Left Arm, Patient Position: Sitting)   Pulse 79   Resp 18   Ht 6' (1.829 m)   Wt 268 lb (121.6 kg)   BMI 36.35 kg/m   Gen: WD/WN, NAD Head: [place]/AT, No temporalis wasting.  Ear/Nose/Throat: Hearing grossly  intact, nares w/o erythema or drainage Eyes: PER, EOMI, sclera nonicteric.  Neck: Supple, no masses.  No JVD.  Pulmonary:  Good air movement, no use of accessory muscles.  Cardiac: RRR Vascular:  Left lower extremity cold.  No ulcerations Vessel Right Left  Radial Palpable Palpable  Dorsalis Pedis  not palpable  not palpable  Posterior Tibial  not palpable  not palpable   Gastrointestinal: soft, non-distended. No guarding/no peritoneal signs.  Musculoskeletal: M/S 5/5 throughout.    Bilateral transmetatarsal amputations Neurologic: Pain and light touch intact in extremities.  Symmetrical.  Speech is fluent. Motor exam as listed above. Psychiatric: Judgment intact, Mood & affect appropriate for pt's clinical situation. Dermatologic: No Venous rashes. No Ulcers Noted.  No changes consistent with cellulitis. Lymph : No Cervical lymphadenopathy, no lichenification or skin changes of chronic lymphedema.      Assessment & Plan:   1. Atherosclerosis of native artery of left lower extremity with rest pain ([place]) The patient underwent a lower extremity arterial duplex today which revealed triphasic/biphasic waveforms to the level of the deep femoral artery, transitioning to monophasic at the level of the SFA.  The stent placed in the popliteal artery is  occluded.  There are some dampened monophasic waveforms in the tibial arteries, likely provided by collateralization.  This contrasts with the left lower extremity duplex done on 05/14/2018 and Dr. Tyrell Antonio office, which showed a 35 to 99% stenosis distal SFA/proximal popliteal artery.  The popliteal stent revealed a 50 to 99% stenosis as well at that time.   Recommend:  The patient has evidence of severe atherosclerotic changes of both lower extremities with rest pain that is associated with preulcerative changes and impending tissue loss of the foot.  This represents a limb threatening ischemia and places the patient at the risk for limb loss.  Patient should undergo angiography of the lower extremities with the hope for intervention for limb salvage.  The risks and benefits as well as the alternative therapies was discussed in detail with the patient.  All questions were answered.  Patient agrees to proceed with angiography.  The patient will follow up with me in the office after the procedure.     2. Essential hypertension Continue antihypertensive medications as already ordered, these medications have been reviewed and there are no changes at this time.   3. Hyperlipidemia, unspecified hyperlipidemia type Continue statin as ordered and reviewed, no changes at this time    Current Outpatient Medications on File Prior to Visit  Medication Sig Dispense Refill  . amLODipine (NORVASC) 5 MG tablet   1  . ASPIRIN 81 PO 1 tablet  (81mg ) by mouth daily    . atorvastatin (LIPITOR) 40 MG tablet   1  . carvedilol (COREG) 25 MG tablet Take 25 mg by mouth 2 (two) times daily with a meal.     . cetirizine (ZYRTEC) 10 MG tablet   5  . clobetasol cream (TEMOVATE) 2.59 % Apply 1 application topically 2 (two) times daily.    . clopidogrel (PLAVIX) 75 MG tablet Take 1 tablet (75 mg total) by mouth daily. 30 tablet 11  . escitalopram (LEXAPRO) 10 MG tablet Take 10 mg by mouth daily.     . Fe Fum-FA-B  Cmp-C-Zn-Mg-Mn-Cu (CENTRATEX) 106-1 MG CAPS Take by mouth.    . fluticasone (FLONASE) 50 MCG/ACT nasal spray Place 2 sprays into both nostrils daily.    Marland Kitchen gentamicin cream (GARAMYCIN) 0.1 % Apply 1 application topically 3 (three) times daily. [place]  g 1  . ibuprofen (ADVIL,MOTRIN) 200 MG tablet Take 200 mg by mouth every 6 (six) hours as needed.    Marland Kitchen lisinopril (PRINIVIL,ZESTRIL) 2.5 MG tablet Take 2.5 mg by mouth. Tuesday, Thursday , Saturday and Sunday on non dialysis.    Marland Kitchen pregabalin (LYRICA) 75 MG capsule 75 mg.   2  . sevelamer carbonate (RENVELA) 800 MG tablet Take 1,600-3,200 mg by mouth See admin instructions. Take 3200 mg with meals / 1600 mg with snacks    . sulfamethoxazole-trimethoprim (BACTRIM DS,SEPTRA DS) 800-160 MG tablet Take 1 tablet by mouth 2 (two) times daily. 28 tablet 0  . vitamin C (ASCORBIC ACID) 500 MG tablet Take 500 mg by mouth daily.      No current facility-administered medications on file prior to visit.     There are no Patient Instructions on file for this visit. No follow-ups on file.   Kris Hartmann, NP  This note was completed with Sales executive.  Any errors are purely unintentional.

## 2018-06-17 ENCOUNTER — Other Ambulatory Visit (INDEPENDENT_AMBULATORY_CARE_PROVIDER_SITE_OTHER): Payer: Self-pay | Admitting: Nurse Practitioner

## 2018-06-22 MED ORDER — CLINDAMYCIN PHOSPHATE 300 MG/50ML IV SOLN
300.0000 mg | Freq: Once | INTRAVENOUS | Status: AC
  Administered 2018-06-23: 300 mg via INTRAVENOUS

## 2018-06-23 ENCOUNTER — Encounter
Admission: RE | Disposition: A | Discharge status: Home / Self Care | Payer: Self-pay | Source: Ambulatory Visit | Attending: Vascular Surgery

## 2018-06-23 ENCOUNTER — Other Ambulatory Visit: Payer: Self-pay

## 2018-06-23 ENCOUNTER — Ambulatory Visit
Admission: RE | Admit: 2018-06-23 | Discharge: 2018-06-23 | Disposition: A | Discharge status: Home / Self Care | Payer: Medicare Other | Source: Ambulatory Visit | Attending: Vascular Surgery | Admitting: Vascular Surgery

## 2018-06-23 DIAGNOSIS — Z7951 Long term (current) use of inhaled steroids: Secondary | ICD-10-CM | POA: Insufficient documentation

## 2018-06-23 DIAGNOSIS — Z7902 Long term (current) use of antithrombotics/antiplatelets: Secondary | ICD-10-CM | POA: Insufficient documentation

## 2018-06-23 DIAGNOSIS — Z8249 Family history of ischemic heart disease and other diseases of the circulatory system: Secondary | ICD-10-CM | POA: Insufficient documentation

## 2018-06-23 DIAGNOSIS — Z955 Presence of coronary angioplasty implant and graft: Secondary | ICD-10-CM | POA: Diagnosis not present

## 2018-06-23 DIAGNOSIS — Z7982 Long term (current) use of aspirin: Secondary | ICD-10-CM | POA: Diagnosis not present

## 2018-06-23 DIAGNOSIS — I129 Hypertensive chronic kidney disease with stage 1 through stage 4 chronic kidney disease, or unspecified chronic kidney disease: Secondary | ICD-10-CM | POA: Diagnosis not present

## 2018-06-23 DIAGNOSIS — N189 Chronic kidney disease, unspecified: Secondary | ICD-10-CM | POA: Diagnosis not present

## 2018-06-23 DIAGNOSIS — I70222 Atherosclerosis of native arteries of extremities with rest pain, left leg: Secondary | ICD-10-CM | POA: Insufficient documentation

## 2018-06-23 DIAGNOSIS — E785 Hyperlipidemia, unspecified: Secondary | ICD-10-CM | POA: Diagnosis not present

## 2018-06-23 DIAGNOSIS — I70229 Atherosclerosis of native arteries of extremities with rest pain, unspecified extremity: Secondary | ICD-10-CM

## 2018-06-23 DIAGNOSIS — Z880 Allergy status to penicillin: Secondary | ICD-10-CM | POA: Diagnosis not present

## 2018-06-23 DIAGNOSIS — T82856A Stenosis of peripheral vascular stent, initial encounter: Secondary | ICD-10-CM

## 2018-06-23 DIAGNOSIS — Y831 Surgical operation with implant of artificial internal device as the cause of abnormal reaction of the patient, or of later complication, without mention of misadventure at the time of the procedure: Secondary | ICD-10-CM | POA: Diagnosis not present

## 2018-06-23 DIAGNOSIS — Z79899 Other long term (current) drug therapy: Secondary | ICD-10-CM | POA: Insufficient documentation

## 2018-06-23 DIAGNOSIS — E1122 Type 2 diabetes mellitus with diabetic chronic kidney disease: Secondary | ICD-10-CM | POA: Insufficient documentation

## 2018-06-23 HISTORY — PX: LOWER EXTREMITY ANGIOGRAPHY: CATH118251

## 2018-06-23 LAB — BASIC METABOLIC PANEL
Anion gap: 15 (ref 5–15)
BUN: 38 mg/dL — ABNORMAL HIGH (ref 6–20)
CO2: 34 mmol/L — ABNORMAL HIGH (ref 22–32)
Calcium: 8.2 mg/dL — ABNORMAL LOW (ref 8.9–10.3)
Chloride: 90 mmol/L — ABNORMAL LOW (ref 98–111)
Creatinine, Ser: 8.83 mg/dL — ABNORMAL HIGH (ref 0.61–1.24)
GFR, EST AFRICAN AMERICAN: 7 mL/min — AB (ref >60)
GFR, EST NON AFRICAN AMERICAN: 6 mL/min — AB (ref >60)
Glucose, Bld: 113 mg/dL — ABNORMAL HIGH (ref 70–99)
Potassium: 4.5 mmol/L (ref 3.5–5.1)
Sodium: 139 mmol/L (ref 135–145)

## 2018-06-23 LAB — GLUCOSE, CAPILLARY: Glucose-Capillary: 94 mg/dL (ref 70–99)

## 2018-06-23 SURGERY — LOWER EXTREMITY ANGIOGRAPHY
Anesthesia: Moderate Sedation | Laterality: Left

## 2018-06-23 MED ORDER — HEPARIN SODIUM (PORCINE) 1000 UNIT/ML IJ SOLN
INTRAMUSCULAR | Status: DC | PRN
  Administered 2018-06-23: 4000 [IU] via INTRAVENOUS

## 2018-06-23 MED ORDER — MIDAZOLAM HCL 5 MG/5ML IJ SOLN
INTRAMUSCULAR | Status: AC
  Filled 2018-06-23: qty 5

## 2018-06-23 MED ORDER — HYDROMORPHONE HCL 1 MG/ML IJ SOLN: 1.0000 mg | Freq: Once | INTRAMUSCULAR | Status: DC | PRN

## 2018-06-23 MED ORDER — ONDANSETRON HCL 4 MG/2ML IJ SOLN
INTRAMUSCULAR | Status: AC
  Administered 2018-06-23: 4 mg via INTRAVENOUS
  Filled 2018-06-23: qty 2

## 2018-06-23 MED ORDER — HEPARIN SODIUM (PORCINE) 1000 UNIT/ML IJ SOLN
INTRAMUSCULAR | Status: AC
  Filled 2018-06-23: qty 1

## 2018-06-23 MED ORDER — IOPAMIDOL (ISOVUE-300) INJECTION 61%
INTRAVENOUS | Status: DC | PRN
  Administered 2018-06-23: 65 mL via INTRAVENOUS

## 2018-06-23 MED ORDER — LIDOCAINE-EPINEPHRINE (PF) 1 %-1:200000 IJ SOLN
INTRAMUSCULAR | Status: AC
  Filled 2018-06-23: qty 10

## 2018-06-23 MED ORDER — FENTANYL CITRATE (PF) 100 MCG/2ML IJ SOLN
INTRAMUSCULAR | Status: AC
  Filled 2018-06-23: qty 2

## 2018-06-23 MED ORDER — FENTANYL CITRATE (PF) 100 MCG/2ML IJ SOLN
INTRAMUSCULAR | Status: DC | PRN
  Administered 2018-06-23: 25 ug via INTRAVENOUS
  Administered 2018-06-23: 50 ug via INTRAVENOUS
  Administered 2018-06-23: 25 ug via INTRAVENOUS
  Administered 2018-06-23 (×2): 50 ug via INTRAVENOUS

## 2018-06-23 MED ORDER — LIDOCAINE HCL (PF) 1 % IJ SOLN
INTRAMUSCULAR | Status: AC
  Filled 2018-06-23: qty 30

## 2018-06-23 MED ORDER — MIDAZOLAM HCL 2 MG/2ML IJ SOLN
INTRAMUSCULAR | Status: DC | PRN
  Administered 2018-06-23 (×3): 1 mg via INTRAVENOUS
  Administered 2018-06-23: 2 mg via INTRAVENOUS

## 2018-06-23 MED ORDER — ONDANSETRON HCL 4 MG/2ML IJ SOLN
4.0000 mg | Freq: Four times a day (QID) | INTRAMUSCULAR | Status: DC | PRN
  Administered 2018-06-23: 4 mg via INTRAVENOUS

## 2018-06-23 MED ORDER — CLINDAMYCIN PHOSPHATE 300 MG/50ML IV SOLN
INTRAVENOUS | Status: AC
  Filled 2018-06-23: qty 50

## 2018-06-23 MED ORDER — HEPARIN (PORCINE) IN NACL 1000-0.9 UT/500ML-% IV SOLN
INTRAVENOUS | Status: AC
  Filled 2018-06-23: qty 1000

## 2018-06-23 MED ORDER — SODIUM CHLORIDE 0.9 % IV SOLN
INTRAVENOUS | Status: DC
  Administered 2018-06-23: 1000 mL via INTRAVENOUS

## 2018-06-23 SURGICAL SUPPLY — 25 items
BALLN DORADO 5X200X135 (BALLOONS) ×3
BALLN LUTONIX 5X220X130 (BALLOONS) ×3
BALLN LUTONIX DCB 4X60X130 (BALLOONS) ×3
BALLN LUTONIX DCB 4X80X130 (BALLOONS) ×3
BALLN ULTRVRSE 3X40X130C (BALLOONS) ×3
BALLOON DORADO 5X200X135 (BALLOONS) ×1 IMPLANT
BALLOON LUTONIX 5X220X130 (BALLOONS) ×1 IMPLANT
BALLOON LUTONIX DCB 4X60X130 (BALLOONS) ×1 IMPLANT
BALLOON LUTONIX DCB 4X80X130 (BALLOONS) ×1 IMPLANT
BALLOON ULTRVRSE 3X40X130C (BALLOONS) ×1 IMPLANT
CATH PIG 70CM (CATHETERS) ×3 IMPLANT
CATH VERT 5FR 125CM (CATHETERS) ×3 IMPLANT
DEVICE PRESTO INFLATION (MISCELLANEOUS) ×3 IMPLANT
DEVICE STARCLOSE SE CLOSURE (Vascular Products) ×3 IMPLANT
DEVICE TORQUE .025-.038 (MISCELLANEOUS) ×3 IMPLANT
NEEDLE ENTRY 21GA 7CM ECHOTIP (NEEDLE) ×3 IMPLANT
PACK ANGIOGRAPHY (CUSTOM PROCEDURE TRAY) ×3 IMPLANT
SET INTRO CAPELLA COAXIAL (SET/KITS/TRAYS/PACK) ×3 IMPLANT
SHEATH BRITE TIP 5FRX11 (SHEATH) ×3 IMPLANT
SHEATH RAABE 6FR (SHEATH) ×3 IMPLANT
SYR MEDRAD MARK V 150ML (SYRINGE) ×3 IMPLANT
TUBING CONTRAST HIGH PRESS 72 (TUBING) ×3 IMPLANT
WIRE AQUATRACK .035X260CM (WIRE) ×3 IMPLANT
WIRE J 3MM .035X145CM (WIRE) ×3 IMPLANT
WIRE MAGIC TORQUE 315CM (WIRE) ×3 IMPLANT

## 2018-06-23 NOTE — Op Note (Signed)
[facility] Percutaneous Study/Intervention Procedural Note   Date of Surgery: 06/23/2018  Surgeon: Hortencia Pilar  Pre-operative Diagnosis: Atherosclerotic occlusive disease bilateral lower extremities with left lower extremity rest pain; complication of vascular device with stricture stenosis of SFA popliteal stent.  Post-operative diagnosis: Same  Procedure(s) Performed: 1. Introduction catheter into left lower extremity 3rd order catheter placement with additional third order 2. Contrast injection left lower extremity for distal runoff  3. Percutaneous transluminal angioplasty left superficial femoral and popliteal arteries to 5 mm with Lutonix drug-eluting balloon 4. Percutaneous transluminal angioplasty left posterior tibial artery to 4 mm with a Lutonix drug-eluting balloon  5. Percutaneous transluminal angioplasty left anterior tibial artery with a 4 medium millimeter Lutonix drug-eluting balloon             6.  Star close closure right common femoral arteriotomy  Anesthesia: Conscious sedation was administered under my direct supervision by the interventional radiology RN. IV Versed plus fentanyl were utilized. Continuous ECG, pulse oximetry and blood pressure was monitored throughout the entire procedure.  Conscious sedation was for a total of 1 hour 30 minutes.  Sheath: 6 [place] Ansell right common femoral artery retrograde  Contrast: 65 cc  Fluoroscopy Time: 10.3 minutes  Indications: YOUNG MULVEY presents with increasing pain of the left lower extremity.  Previous noninvasive studies demonstrated a hemodynamically significant stricture located within the previously placed stent left popliteal and distal SFA.  This suggests the patient is having limb threatening ischemia. The risks and benefits are reviewed all questions answered patient agrees to  proceed.  Procedure:Clarke S Carbonneau is a 52 y.o. y.o. male who was identified and appropriate procedural time out was performed. The patient was then placed supine on the table and prepped and draped in the usual sterile fashion.   Ultrasound was placed in the sterile sleeve and the right groin was evaluated the right common femoral artery was echolucent and pulsatile indicating patency. Image was recorded for the permanent record and under real-time visualization a microneedle was inserted into the common femoral artery followed by the microwire and then the micro-sheath. A J-wire was then advanced through the micro-sheath and a 5 [place] sheath was then inserted over a J-wire. J-wire was then advanced and a 5 French pigtail catheter was positioned at the level of T12.  AP projection of the aorta was then obtained. Pigtail catheter was repositioned to above the bifurcation and a RAO view of the pelvis was obtained. Subsequently a pigtail catheter with the stiff angle Glidewire was used to cross the aortic bifurcation the catheter wire were advanced down into the left distal external iliac artery. Oblique view of the femoral bifurcation was then obtained and subsequently the wire was reintroduced and the pigtail catheter negotiated into the SFA representing third order catheter placement. Distal runoff was then performed.  Angiography demonstrated patency of the aorta and bilateral iliac arteries.  No hemodynamically significant lesions were noted.  The left common femoral and profunda femoris are patent without hemodynamically significant lesions identified.  The left SFA demonstrates some mild atherosclerotic irregularities in its proximal two thirds.  At the level of Hunter's canal previously placed stent is identified extending to the below-knee popliteal.  This stent is occluded.  The distal popliteal is reconstituted.  The trifurcation is significantly diseased with a 50 to 60% stenosis at the  origin of the tibioperoneal trunk there is a 60% stenosis over the first several centimeters of the anterior tibial.  Distally there is  a greater than 80% stenosis.  More distally in the midportion of the anterior tibial there is a second 80% stenosis.  Beyond that there is patency of the AT filling the dorsalis pedis.  The posterior tibial demonstrates hemodynamically significant greater than 70% stenosis at its origin which then extends distally for several centimeters becoming a string sign of greater than 90%.  Beyond this lesion the posterior tibial is patent down to the foot and fills the medial and lateral plantar vessels.  Based on these angiographic findings I decided to perform intervention for limb salvage.  5000 units of heparin was then given and allowed to circulate and a 6 [place] Raby sheath was advanced up and over the bifurcation and positioned in the femoral artery  Vertebral catheter and stiff angle Glidewire were then negotiated down into the distal posterior tibial artery.   The detector was then positioned over the knee and the distal SFA and popliteal arteries were reimaged.  The occlusion of the previously placed stent within the distal SFA and proximal two thirds of the popliteal is again identified.  The first angioplasty was performed with a 4 mm x 15 cm ultra score balloon.  2 separate inflations were required.  Each inflation was for 1 minute to approximately 12 atm.  Follow-up imaging now demonstrated patency with moderate residual stenosis.  Given this significant improvement I elected to treat this area with a 5 mm x 22 cm Lutonix drug-eluting balloon.  Inflation was to 12 atm for 2 minutes.  Follow-up imaging demonstrated patency with excellent result and less than 5% residual stenosis throughout the SFA and popliteal arteries. Distal runoff was then reassessed and noted to be unchanged.   Having already negotiated the wire into the posterior tibial magnified imaging of the  proximal portion was performed.  A 3 mm x 40 mm ultra score balloon was then used to treat the 90% lesion inflation was to 12 atm for 1 minute.  This was then followed by a 4 mm x 60 mm Lutonix drug-eluting balloon treating the proximal portion of the posterior tibial extending into the tibioperoneal trunk.  Inflation was to 5 atm for 2 full minutes.  Follow-up imaging demonstrated less than 5% residual stenosis.  The catheter was then reintroduced and distal runoff of the posterior tibial was performed by hand-injection.  Patency distally was maintained.  The Magic torque wire negotiated into the anterior tibial artery.  A 4 mm x 80 mm Lutonix drug-eluting balloon was then advanced across the anterior tibial from its origin distally.  Inflation was to 6 atm for 2 minutes.  The 3 mm x 40 mm ultra score balloon was then advanced down to treat the second lesion more distally inflation was to 12 atm for 1 minute.  Catheter was then readvanced into the anterior tibial and hand-injection contrast demonstrated wide patency with less than 5% residual stenosis and continued patency of the anterior tibial filling the dorsalis pedis.  After review of these images the sheath is pulled into the right external iliac oblique of the common femoral is obtained and a Star close device deployed. There no immediate Complications.  Findings:  Angiography demonstrated patency of the aorta and bilateral iliac arteries.  No hemodynamically significant lesions were noted.  The left common femoral and profunda femoris are patent without hemodynamically significant lesions identified.  The left SFA demonstrates some mild atherosclerotic irregularities in its proximal two thirds.  At the level of Hunter's canal previously placed stent is identified  extending to the below-knee popliteal.  This stent is occluded.  The distal popliteal is reconstituted.  The trifurcation is significantly diseased with a 50 to 60% stenosis at the origin of  the tibioperoneal trunk there is a 60% stenosis over the first several centimeters of the anterior tibial.  Distally there is a greater than 80% stenosis.  More distally in the midportion of the anterior tibial there is a second 80% stenosis.  Beyond that there is patency of the AT filling the dorsalis pedis.  The posterior tibial demonstrates hemodynamically significant greater than 70% stenosis at its origin which then extends distally for several centimeters becoming a string sign of greater than 90%.  Beyond this lesion the posterior tibial is patent down to the foot and fills the medial and lateral plantar vessels.  Following angioplasty of the SFA and popliteal artery there is less than 5% residual stenosis.  Following angioplasty of the posterior tibial and the anterior tibial arteries primarily with a 4 mm Lutonix drug-eluting balloon in each there is less than 5% residual stenosis.   Summary: Successful recanalization left lower extremity for limb salvage   Disposition: Patient was taken to the recovery room in stable condition having tolerated the procedure well.  Belenda Cruise Cal[facility] Difranco 06/23/2018,5:50 PM

## 2018-06-23 NOTE — H&P (Signed)
[facility] History & Physical Update  The patient was interviewed and re-examined.  The patient's previous History and Physical has been reviewed and is unchanged.  There is no change in the plan of care. We plan to proceed with the scheduled procedure.  Hortencia Pilar, MD  06/23/2018, 2:51 PM

## 2018-06-24 ENCOUNTER — Encounter: Payer: Self-pay | Admitting: Vascular Surgery

## 2018-07-30 ENCOUNTER — Ambulatory Visit (INDEPENDENT_AMBULATORY_CARE_PROVIDER_SITE_OTHER): Payer: Medicare Other | Admitting: Vascular Surgery

## 2018-07-30 ENCOUNTER — Encounter (INDEPENDENT_AMBULATORY_CARE_PROVIDER_SITE_OTHER): Payer: Self-pay | Admitting: Vascular Surgery

## 2018-07-30 ENCOUNTER — Ambulatory Visit (INDEPENDENT_AMBULATORY_CARE_PROVIDER_SITE_OTHER): Payer: Medicare Other

## 2018-07-30 VITALS — BP 142/87 | HR 86 | Resp 16 | Ht 72.0 in | Wt 266.2 lb

## 2018-07-30 DIAGNOSIS — E1122 Type 2 diabetes mellitus with diabetic chronic kidney disease: Secondary | ICD-10-CM | POA: Diagnosis not present

## 2018-07-30 DIAGNOSIS — N186 End stage renal disease: Secondary | ICD-10-CM

## 2018-07-30 DIAGNOSIS — I70213 Atherosclerosis of native arteries of extremities with intermittent claudication, bilateral legs: Secondary | ICD-10-CM

## 2018-07-30 DIAGNOSIS — T829XXA Unspecified complication of cardiac and vascular prosthetic device, implant and graft, initial encounter: Secondary | ICD-10-CM | POA: Insufficient documentation

## 2018-07-30 DIAGNOSIS — I25118 Atherosclerotic heart disease of native coronary artery with other forms of angina pectoris: Secondary | ICD-10-CM

## 2018-07-30 DIAGNOSIS — I739 Peripheral vascular disease, unspecified: Secondary | ICD-10-CM

## 2018-07-30 DIAGNOSIS — Z992 Dependence on renal dialysis: Secondary | ICD-10-CM

## 2018-07-30 DIAGNOSIS — T829XXS Unspecified complication of cardiac and vascular prosthetic device, implant and graft, sequela: Secondary | ICD-10-CM

## 2018-07-30 NOTE — Progress Notes (Signed)
MRN : [id number]  Ralph Peterson is a 53 y.o. (07/17/1966) male who presents with chief complaint of  Chief Complaint  Patient presents with  . Follow-up  .  History of Present Illness:   The patient returns to the office for followup and review status post angiogram with intervention on 06/23/2018.   Procedure(s) Performed: 1. Introduction catheter into left lower extremity 3rd order catheter placement with additional third order 2. Contrast injection left lower extremity for distal runoff  3. Percutaneous transluminal angioplasty left superficial femoral and popliteal arteries to 5 mm with Lutonix drug-eluting balloon 4. Percutaneous transluminal angioplasty left posterior tibial artery to 4 mm with a Lutonix drug-eluting balloon  5. Percutaneous transluminal angioplasty left anterior tibial artery with a 4 medium millimeter Lutonix drug-eluting balloon             6.  Star close closure right common femoral arteriotomy  The patient notes improvement in the lower extremity symptoms. No interval shortening of the patient's claudication distance or rest pain symptoms. Previous wounds have now healed.  No new ulcers or wounds have occurred since the last visit.  There have been no significant changes to the patient's overall health care.  The patient denies amaurosis fugax or recent TIA symptoms. There are no recent neurological changes noted. The patient denies history of DVT, PE or superficial thrombophlebitis. The patient denies recent episodes of angina or shortness of breath.   ABI's Rt=[place] and Lt=[place] triphasic signals bilaterally     Current Meds  Medication Sig  . acetaminophen (TYLENOL) 500 MG tablet Take 1,000 mg by mouth 2 (two) times daily as needed for moderate pain.  Marland Kitchen amLODipine (NORVASC) 5 MG tablet Take 5 mg by mouth every evening.   Marland Kitchen aspirin EC 81 MG tablet Take 81 mg by mouth every evening.   Marland Kitchen atorvastatin (LIPITOR) 40 MG tablet Take 40 mg by mouth every evening.   . carvedilol (COREG) 25 MG tablet Take 25 mg by mouth 2 (two) times daily with a meal.   . cetirizine (ZYRTEC) 10 MG tablet Take 10 mg by mouth every evening.   . clopidogrel (PLAVIX) 75 MG tablet Take 1 tablet (75 mg total) by mouth daily. (Patient taking differently: Take 75 mg by mouth every evening. )  . diphenhydrAMINE (BENADRYL) 25 MG tablet Take 25 mg by mouth at bedtime as needed for allergies.  Marland Kitchen escitalopram (LEXAPRO) 10 MG tablet Take 10 mg by mouth daily.   . fluticasone (FLONASE) 50 MCG/ACT nasal spray Place 2 sprays into both nostrils every evening.   Marland Kitchen lisinopril (PRINIVIL,ZESTRIL) 2.5 MG tablet Take 2.5 mg by mouth every evening.   . sevelamer carbonate (RENVELA) 800 MG tablet Take 1,600-3,200 mg by mouth See admin instructions. Take 3200 mg with meals / 1600 mg with snacks    Past Medical History:  Diagnosis Date  . Chronic kidney disease   . Coronary artery disease   . Diabetes mellitus without complication ([place])   . Hyperlipidemia   . Hypertension     Past Surgical History:  Procedure Laterality Date  . ABDOMINAL AORTOGRAM N/A 09/10/2017   Procedure: ABDOMINAL AORTOGRAM;  Surgeon: Wellington Hampshire, MD;  Location: [facility];  Service: Cardiovascular;  Laterality: N/A;  . AMPUTATION TOE Bilateral    one toe on right, all five toes on the left  . CARDIAC CATHETERIZATION  2012   [facility]; X1 stent 2.5 x 33 mm xience stent Distal TCA  .  LOWER EXTREMITY ANGIOGRAPHY Bilateral 09/10/2017   Procedure: Lower Extremity Angiography;  Surgeon: Wellington Hampshire, MD;  Location: [place] [facility];  Service: Cardiovascular;  Laterality: Bilateral;  . LOWER EXTREMITY ANGIOGRAPHY Left 06/23/2018   Procedure: LOWER EXTREMITY ANGIOGRAPHY;  Surgeon: Katha Cabal, MD;  Location: [place] [facility];  Service: Cardiovascular;  Laterality: Left;  . PERIPHERAL VASCULAR ATHERECTOMY Left  09/10/2017   Procedure: PERIPHERAL VASCULAR ATHERECTOMY;  Surgeon: Wellington Hampshire, MD;  Location: [place] [facility];  Service: Cardiovascular;  Laterality: Left;  SFA/POPLITEAL  . PERIPHERAL VASCULAR BALLOON ANGIOPLASTY Left 09/10/2017   Procedure: PERIPHERAL VASCULAR BALLOON ANGIOPLASTY;  Surgeon: Wellington Hampshire, MD;  Location: [facility];  Service: Cardiovascular;  Laterality: Left;  ANTERIAL TIBIAL  . PERIPHERAL VASCULAR CATHETERIZATION Right 03/26/2016   Procedure: Lower Extremity Angiography;  Surgeon: Katha Cabal, MD;  Location: [place];  Service: Cardiovascular;  Laterality: Right;  . PERIPHERAL VASCULAR INTERVENTION Left 09/10/2017   Procedure: PERIPHERAL VASCULAR INTERVENTION;  Surgeon: Wellington Hampshire, MD;  Location: [place] [facility];  Service: Cardiovascular;  Laterality: Left;  SFA/POPLITEAL    Social History Social History   Tobacco Use  . Smoking status: Never Smoker  . Smokeless tobacco: Never Used  Substance Use Topics  . Alcohol use: No    Frequency: Never  . Drug use: No    Family History Family History  Problem Relation Age of Onset  . Hypertension Mother     Allergies  Allergen Reactions  . Penicillins Rash    Has patient had a PCN reaction causing immediate rash, facial/tongue/throat swelling, SOB or lightheadedness with hypotension: Yes Has patient had a PCN reaction causing severe rash involving mucus membranes or skin necrosis: Yes Has patient had a PCN reaction that required hospitalization: No Has patient had a PCN reaction occurring within the last 10 years: No If all of the above answers are "NO", then may proceed with Cephalosporin use.      REVIEW OF SYSTEMS (Negative unless checked)  Constitutional: [] Weight loss  [] Fever  [] Chills Cardiac: [] Chest pain   [] Chest pressure   [] Palpitations   [] Shortness of breath when laying flat   [] Shortness of breath with exertion. Vascular:  [x] Pain in legs with walking    [] Pain in legs at rest  [] History of DVT   [] Phlebitis   [] Swelling in legs   [] Varicose veins   [] Non-healing ulcers Pulmonary:   [] Uses home oxygen   [] Productive cough   [] Hemoptysis   [] Wheeze  [] COPD   [] Asthma Neurologic:  [] Dizziness   [] Seizures   [] History of stroke   [] History of TIA  [] Aphasia   [] Vissual changes   [] Weakness or numbness in arm   [] Weakness or numbness in leg Musculoskeletal:   [] Joint swelling   [] Joint pain   [] Low back pain Hematologic:  [] Easy bruising  [] Easy bleeding   [] Hypercoagulable state   [] Anemic Gastrointestinal:  [] Diarrhea   [] Vomiting  [] Gastroesophageal reflux/heartburn   [] Difficulty swallowing. Genitourinary:  [] Chronic kidney disease   [] Difficult urination  [] Frequent urination   [] Blood in urine Skin:  [] Rashes   [] Ulcers  Psychological:  [] History of anxiety   []  History of major depression.  Physical Examination  Vitals:   07/30/18 1057  BP: (!) 142/87  Pulse: 86  Resp: 16  Weight: 266 lb 3.2 oz (120.7 kg)  Height: 6' (1.829 m)   Body mass index is 36.1 kg/m. Gen: WD/WN, NAD Head: /AT, No temporalis wasting.  Ear/Nose/Throat: Hearing grossly intact, nares w/o erythema or drainage Eyes: PER, EOMI, sclera nonicteric.  Neck: Supple, no large masses.   Pulmonary:  Good air movement, no audible wheezing bilaterally, no use of accessory muscles.  Cardiac: RRR, no JVD Vascular:  Vessel Right Left  Radial Palpable Palpable  PT Palpable Palpable  DP Palpable Palpable  Gastrointestinal: Non-distended. No guarding/no peritoneal signs.  Musculoskeletal: M/S 5/5 throughout.  Left TMA Neurologic: CN 2-12 intact. Symmetrical.  Speech is fluent. Motor exam as listed above. Psychiatric: Judgment intact, Mood & affect appropriate for pt's clinical situation. Dermatologic: No rashes or ulcers noted.  No changes consistent with cellulitis. Lymph : No lichenification or skin changes of chronic lymphedema.  CBC Lab Results  Component Value  Date   WBC 12.0 (H) 09/05/2017   HGB 11.6 (L) 09/05/2017   HCT 34.3 (L) 09/05/2017   MCV 94.5 09/05/2017   PLT 289 09/05/2017    BMET    Component Value Date/Time   NA 139 06/23/2018 1429   NA 139 03/19/2016 1423   K 4.5 06/23/2018 1429   CL 90 (L) 06/23/2018 1429   CO2 34 (H) 06/23/2018 1429   GLUCOSE 113 (H) 06/23/2018 1429   BUN 38 (H) 06/23/2018 1429   BUN 33 (H) 03/19/2016 1423   CREATININE 8.83 (H) 06/23/2018 1429   CALCIUM 8.2 (L) 06/23/2018 1429   GFRNONAA 6 (L) 06/23/2018 1429   GFRAA 7 (L) 06/23/2018 1429   CrCl cannot be calculated (Patient's most recent lab result is older than the maximum 21 days allowed.).  COAG Lab Results  Component Value Date   INR 1.16 09/05/2017    Radiology No results found.    Assessment/Plan 1. Atherosclerosis of native artery of both lower extremities with intermittent claudication (HCC) Recommend:  The patient is status post successful angiogram with intervention.  The patient reports that the claudication symptoms and leg pain is essentially gone.   The patient denies lifestyle limiting changes at this point in time.  No further invasive studies, angiography or surgery at this time The patient should continue walking and begin a more formal exercise program.  The patient should continue antiplatelet therapy and aggressive treatment of the lipid abnormalities  Smoking cessation was again discussed  The patient should continue wearing graduated compression socks 10-15 mmHg strength to control the mild edema.  Patient should undergo noninvasive studies as ordered. The patient will follow up with me after the studies.   - VAS [place] LOWER EXTREMITY ARTERIAL DUPLEX; Future - VAS [place] ABI WITH/WO TBI; Future  2. Complication of vascular access for dialysis, sequela Recommend:  The patient is doing well and currently has adequate dialysis access. The patient's dialysis center is not reporting any access issues. Flow pattern is  stable when compared to the prior ultrasound.  The patient should have a duplex ultrasound of the dialysis access in 3 months.  The patient will follow-up with me in the office after each ultrasound    - VAS [place] (AVF, AVG); Future  3. ESRD (end stage renal disease) ([place]) Continue dialysis  Without interuption  4. Coronary artery disease of native artery of native heart with stable angina pectoris (HCC) Continue cardiac and antihypertensive medications as already ordered and reviewed, no changes at this time.  Continue statin as ordered and reviewed, no changes at this time  Nitrates PRN for chest pain   5. Type 2 diabetes mellitus with chronic kidney disease on chronic dialysis, without long-term current use  of insulin ([place]) Continue hypoglycemic medications as already ordered, these medications have been reviewed and there are no changes at this time.  Hgb A1C to be monitored as already arranged by primary service    Hortencia Pilar, MD  07/30/2018 11:05 AM

## 2018-09-16 ENCOUNTER — Other Ambulatory Visit (HOSPITAL_COMMUNITY): Payer: Self-pay | Admitting: Cardiovascular Disease

## 2018-10-29 ENCOUNTER — Encounter (INDEPENDENT_AMBULATORY_CARE_PROVIDER_SITE_OTHER): Payer: Medicare Other

## 2018-10-29 ENCOUNTER — Ambulatory Visit (INDEPENDENT_AMBULATORY_CARE_PROVIDER_SITE_OTHER): Payer: Medicare Other | Admitting: Vascular Surgery

## 2019-01-20 NOTE — Progress Notes (Signed)
MRN : [id number]  Ralph Peterson is a 53 y.o. (11-07-1965) male who presents with chief complaint of No chief complaint on file. Marland Kitchen  History of Present Illness:   The patient returns to the office for followup and review status post angiogram with intervention on 06/23/2018.  He has also had previous left SFA intervention by Dr Fletcher Anon.  Procedure(s) Performed 06/23/2018: 1. Introduction catheter intoleftlower extremity 3rd order catheter placement with additional third order 2.Contrast injectionleftlower extremity for distal runoff  3. Percutaneous transluminal angioplastyleftsuperficial femoral and popliteal arteries to 5 mm with Lutonix drug-eluting balloon 4. Percutaneous transluminal angioplastyleft posterior tibial artery to 4 mm with a Lutonix drug-eluting balloon  5. Percutaneous transluminal angioplasty left anterior tibial artery with a 4 medium millimeter Lutonix drug-eluting balloon 6. Star close closurerightcommon femoral arteriotomy  The patient notes improvement in the lower extremity symptoms. No interval shortening of the patient's claudication distance or rest pain symptoms. Previous wounds have now healed.  No new ulcers or wounds have occurred since the last visit.  There have been no significant changes to the patient's overall health care.  The patient denies amaurosis fugax or recent TIA symptoms. There are no recent neurological changes noted. The patient denies history of DVT, PE or superficial thrombophlebitis. The patient denies recent episodes of angina or shortness of breath.   ABI's Rt=[place] and Lt=[place] triphasic signals bilaterally   Duplex US of the AV access right wrist shows a widely patent access   No outpatient medications have been marked as taking for the 01/21/19 encounter (Appointment) with Delana Meyer, Dolores Lory, MD.    Past Medical History:  Diagnosis Date   . Chronic kidney disease   . Coronary artery disease   . Diabetes mellitus without complication ([place])   . Hyperlipidemia   . Hypertension     Past Surgical History:  Procedure Laterality Date  . ABDOMINAL AORTOGRAM N/A 09/10/2017   Procedure: ABDOMINAL AORTOGRAM;  Surgeon: Wellington Hampshire, MD;  Location: [facility];  Service: Cardiovascular;  Laterality: N/A;  . AMPUTATION TOE Bilateral    one toe on right, all five toes on the left  . CARDIAC CATHETERIZATION  2012   [facility]; X1 stent 2.5 x 33 mm xience stent Distal TCA  . LOWER EXTREMITY ANGIOGRAPHY Bilateral 09/10/2017   Procedure: Lower Extremity Angiography;  Surgeon: Wellington Hampshire, MD;  Location: [facility];  Service: Cardiovascular;  Laterality: Bilateral;  . LOWER EXTREMITY ANGIOGRAPHY Left 06/23/2018   Procedure: LOWER EXTREMITY ANGIOGRAPHY;  Surgeon: Katha Cabal, MD;  Location: [facility];  Service: Cardiovascular;  Laterality: Left;  . PERIPHERAL VASCULAR ATHERECTOMY Left 09/10/2017   Procedure: PERIPHERAL VASCULAR ATHERECTOMY;  Surgeon: Wellington Hampshire, MD;  Location: [facility];  Service: Cardiovascular;  Laterality: Left;  SFA/POPLITEAL  . PERIPHERAL VASCULAR BALLOON ANGIOPLASTY Left 09/10/2017   Procedure: PERIPHERAL VASCULAR BALLOON ANGIOPLASTY;  Surgeon: Wellington Hampshire, MD;  Location: [facility];  Service: Cardiovascular;  Laterality: Left;  ANTERIAL TIBIAL  . PERIPHERAL VASCULAR CATHETERIZATION Right 03/26/2016   Procedure: Lower Extremity Angiography;  Surgeon: Katha Cabal, MD;  Location: [facility];  Service: Cardiovascular;  Laterality: Right;  . PERIPHERAL VASCULAR INTERVENTION Left 09/10/2017   Procedure: PERIPHERAL VASCULAR INTERVENTION;  Surgeon: Wellington Hampshire, MD;  Location: [facility];  Service: Cardiovascular;  Laterality: Left;  SFA/POPLITEAL    Social History Social History   Tobacco Use  . Smoking status:  Never Smoker  . Smokeless tobacco: Never Used  Substance Use Topics  . Alcohol use: No    Frequency: Never  . Drug use: No    Family History Family History  Problem Relation Age of Onset  . Hypertension Mother     Allergies  Allergen Reactions  . Penicillins Rash    Has patient had a PCN reaction causing immediate rash, facial/tongue/throat swelling, SOB or lightheadedness with hypotension: Yes Has patient had a PCN reaction causing severe rash involving mucus membranes or skin necrosis: Yes Has patient had a PCN reaction that required hospitalization: No Has patient had a PCN reaction occurring within the last 10 years: No If all of the above answers are "NO", then may proceed with Cephalosporin use.      REVIEW OF SYSTEMS (Negative unless checked)  Constitutional: [] Weight loss  [] Fever  [] Chills Cardiac: [] Chest pain   [] Chest pressure   [] Palpitations   [] Shortness of breath when laying flat   [] Shortness of breath with exertion. Vascular:  [x] Pain in legs with walking   [] Pain in legs at rest  [] History of DVT   [] Phlebitis   [] Swelling in legs   [] Varicose veins   [] Non-healing ulcers Pulmonary:   [] Uses home oxygen   [] Productive cough   [] Hemoptysis   [] Wheeze  [] COPD   [] Asthma Neurologic:  [] Dizziness   [] Seizures   [] History of stroke   [] History of TIA  [] Aphasia   [] Vissual changes   [] Weakness or numbness in arm   [] Weakness or numbness in leg Musculoskeletal:   [] Joint swelling   [] Joint pain   [] Low back pain Hematologic:  [] Easy bruising  [] Easy bleeding   [] Hypercoagulable state   [] Anemic Gastrointestinal:  [] Diarrhea   [] Vomiting  [] Gastroesophageal reflux/heartburn   [] Difficulty swallowing. Genitourinary:  [x] Chronic kidney disease   [] Difficult urination  [] Frequent urination   [] Blood in urine Skin:  [] Rashes   [] Ulcers  Psychological:  [] History of anxiety   []  History of major depression.  Physical Examination  There were no vitals filed for this  visit. There is no height or weight on file to calculate BMI. Gen: WD/WN, NAD Head: [place]/AT, No temporalis wasting.  Ear/Nose/Throat: Hearing grossly intact, nares w/o erythema or drainage Eyes: PER, EOMI, sclera nonicteric.  Neck: Supple, no large masses.   Pulmonary:  Good air movement, no audible wheezing bilaterally, no use of accessory muscles.  Cardiac: RRR, no JVD Vascular:  Right wrist fistula has a good thrill and good bruit Vessel Right Left  Radial Palpable Palpable  Brachial Palpable Palpable  PT Not Palpable Not Palpable  DP Palpable Palpable  Gastrointestinal: Non-distended. No guarding/no peritoneal signs.  Musculoskeletal: M/S 5/5 throughout.  No deformity or atrophy.  Neurologic: CN 2-12 intact. Symmetrical.  Speech is fluent. Motor exam as listed above. Psychiatric: Judgment intact, Mood & affect appropriate for pt's clinical situation. Dermatologic: No rashes or ulcers noted.  No changes consistent with cellulitis. Lymph : No lichenification or skin changes of chronic lymphedema.  CBC Lab Results  Component Value Date   WBC 12.0 (H) 09/05/2017   HGB 11.6 (L) 09/05/2017   HCT 34.3 (L) 09/05/2017   MCV 94.5 09/05/2017   PLT 289 09/05/2017    BMET    Component Value Date/Time   NA 139 06/23/2018 1429   NA 139 03/19/2016 1423   K 4.5 06/23/2018 1429   CL 90 (L) 06/23/2018 1429   CO2 34 (H) 06/23/2018 1429   GLUCOSE 113 (H) 06/23/2018 1429  BUN 38 (H) 06/23/2018 1429   BUN 33 (H) 03/19/2016 1423   CREATININE 8.83 (H) 06/23/2018 1429   CALCIUM 8.2 (L) 06/23/2018 1429   GFRNONAA 6 (L) 06/23/2018 1429   GFRAA 7 (L) 06/23/2018 1429   CrCl cannot be calculated (Patient's most recent lab result is older than the maximum 21 days allowed.).  COAG Lab Results  Component Value Date   INR 1.16 09/05/2017    Radiology No results found.   Assessment/Plan 1. Atherosclerosis of native artery of both lower extremities with intermittent claudication (HCC)   Recommend:  The patient has evidence of atherosclerosis of the lower extremities with claudication.  The patient does not voice lifestyle limiting changes at this point in time.  Noninvasive studies do not suggest clinically significant change.  No invasive studies, angiography or surgery at this time The patient should continue walking and begin a more formal exercise program.  The patient should continue antiplatelet therapy and aggressive treatment of the lipid abnormalities  No changes in the patient's medications at this time  The patient should continue wearing graduated compression socks 10-15 mmHg strength to control the mild edema.   - VAS [place] ABI WITH/WO TBI; Future - VAS [place] LOWER EXTREMITY ARTERIAL DUPLEX; Future  2. ESRD (end stage renal disease) ([place]) Continue dialysis without interuption  3. Hyperlipidemia, unspecified hyperlipidemia type Continue statin as ordered and reviewed, no changes at this time   4. Type 2 diabetes mellitus with chronic kidney disease on chronic dialysis, without long-term current use of insulin (HCC) Continue hypoglycemic medications as already ordered, these medications have been reviewed and there are no changes at this time.  Hgb A1C to be monitored as already arranged by primary service   5. Coronary artery disease of native artery of native heart with stable angina pectoris (HCC) Continue cardiac and antihypertensive medications as already ordered and reviewed, no changes at this time.  Continue statin as ordered and reviewed, no changes at this time  Nitrates PRN for chest pain    Hortencia Pilar, MD  01/20/2019 8:35 AM

## 2019-01-21 ENCOUNTER — Encounter (INDEPENDENT_AMBULATORY_CARE_PROVIDER_SITE_OTHER): Payer: Self-pay | Admitting: Vascular Surgery

## 2019-01-21 ENCOUNTER — Ambulatory Visit (INDEPENDENT_AMBULATORY_CARE_PROVIDER_SITE_OTHER): Payer: Medicare Other

## 2019-01-21 ENCOUNTER — Other Ambulatory Visit: Payer: Self-pay

## 2019-01-21 ENCOUNTER — Ambulatory Visit (INDEPENDENT_AMBULATORY_CARE_PROVIDER_SITE_OTHER): Payer: Medicare Other | Admitting: Vascular Surgery

## 2019-01-21 VITALS — BP 146/89 | HR 85 | Resp 16 | Ht 72.0 in | Wt 269.0 lb

## 2019-01-21 DIAGNOSIS — E785 Hyperlipidemia, unspecified: Secondary | ICD-10-CM | POA: Diagnosis not present

## 2019-01-21 DIAGNOSIS — I70213 Atherosclerosis of native arteries of extremities with intermittent claudication, bilateral legs: Secondary | ICD-10-CM | POA: Diagnosis not present

## 2019-01-21 DIAGNOSIS — T829XXS Unspecified complication of cardiac and vascular prosthetic device, implant and graft, sequela: Secondary | ICD-10-CM | POA: Diagnosis not present

## 2019-01-21 DIAGNOSIS — N186 End stage renal disease: Secondary | ICD-10-CM | POA: Diagnosis not present

## 2019-01-21 DIAGNOSIS — Z79899 Other long term (current) drug therapy: Secondary | ICD-10-CM

## 2019-01-21 DIAGNOSIS — E1122 Type 2 diabetes mellitus with diabetic chronic kidney disease: Secondary | ICD-10-CM

## 2019-01-21 DIAGNOSIS — Z992 Dependence on renal dialysis: Secondary | ICD-10-CM

## 2019-01-21 DIAGNOSIS — I25118 Atherosclerotic heart disease of native coronary artery with other forms of angina pectoris: Secondary | ICD-10-CM

## 2019-02-07 ENCOUNTER — Encounter (INDEPENDENT_AMBULATORY_CARE_PROVIDER_SITE_OTHER): Payer: Self-pay | Admitting: Vascular Surgery

## 2019-07-01 ENCOUNTER — Telehealth (INDEPENDENT_AMBULATORY_CARE_PROVIDER_SITE_OTHER): Payer: Self-pay

## 2019-07-02 NOTE — Telephone Encounter (Signed)
Patient has been made with medical advice and verbalized understanding

## 2019-07-02 NOTE — Telephone Encounter (Signed)
A message was left on the patient voicemail to return a call back to the office

## 2019-07-02 NOTE — Telephone Encounter (Signed)
Unfortunately this is not something we would be able to assist Mr. Ralph Peterson with.  As far as issues with CHF, that is purely heart related and we focus on vascular issues (veins and arteries).  He should contact his cardiologist, who appears to be Ralph Peterson.  This is because there are medications that may need to be added or changed to get his CHF under control.  We may be able to help with his leg swelling but that honestly is not the primary thing that should be addressed.  His shortness of breath and fatigue can be potentially dangerous.  He may be even be able to reach out to his PCP for help until he can see his cardiologist.  As far as his leg swelling is concerned he should utilize his compression stockings and elevate his lower extremities.

## 2019-07-23 DIAGNOSIS — I214 Non-ST elevation (NSTEMI) myocardial infarction: Secondary | ICD-10-CM

## 2019-07-23 DIAGNOSIS — U071 COVID-19: Secondary | ICD-10-CM

## 2019-07-23 HISTORY — DX: COVID-19: U07.1

## 2019-07-23 HISTORY — DX: Non-ST elevation (NSTEMI) myocardial infarction: I21.4

## 2019-07-26 ENCOUNTER — Other Ambulatory Visit: Payer: Self-pay

## 2019-07-26 ENCOUNTER — Emergency Department: Payer: Medicare PPO

## 2019-07-26 ENCOUNTER — Inpatient Hospital Stay
Admission: EM | Admit: 2019-07-26 | Discharge: 2019-07-29 | DRG: 177 | Disposition: A | Discharge status: Home / Self Care | Payer: Medicare PPO | Attending: Hospitalist | Admitting: Hospitalist

## 2019-07-26 DIAGNOSIS — Z955 Presence of coronary angioplasty implant and graft: Secondary | ICD-10-CM

## 2019-07-26 DIAGNOSIS — I25119 Atherosclerotic heart disease of native coronary artery with unspecified angina pectoris: Secondary | ICD-10-CM | POA: Diagnosis present

## 2019-07-26 DIAGNOSIS — I5020 Unspecified systolic (congestive) heart failure: Secondary | ICD-10-CM | POA: Diagnosis present

## 2019-07-26 DIAGNOSIS — Z8249 Family history of ischemic heart disease and other diseases of the circulatory system: Secondary | ICD-10-CM

## 2019-07-26 DIAGNOSIS — I2510 Atherosclerotic heart disease of native coronary artery without angina pectoris: Secondary | ICD-10-CM | POA: Diagnosis present

## 2019-07-26 DIAGNOSIS — I214 Non-ST elevation (NSTEMI) myocardial infarction: Secondary | ICD-10-CM | POA: Diagnosis present

## 2019-07-26 DIAGNOSIS — N186 End stage renal disease: Secondary | ICD-10-CM

## 2019-07-26 DIAGNOSIS — E1152 Type 2 diabetes mellitus with diabetic peripheral angiopathy with gangrene: Secondary | ICD-10-CM | POA: Diagnosis present

## 2019-07-26 DIAGNOSIS — I4510 Unspecified right bundle-branch block: Secondary | ICD-10-CM | POA: Diagnosis present

## 2019-07-26 DIAGNOSIS — I10 Essential (primary) hypertension: Secondary | ICD-10-CM | POA: Diagnosis present

## 2019-07-26 DIAGNOSIS — Z7902 Long term (current) use of antithrombotics/antiplatelets: Secondary | ICD-10-CM

## 2019-07-26 DIAGNOSIS — I132 Hypertensive heart and chronic kidney disease with heart failure and with stage 5 chronic kidney disease, or end stage renal disease: Secondary | ICD-10-CM | POA: Diagnosis present

## 2019-07-26 DIAGNOSIS — U071 COVID-19: Principal | ICD-10-CM

## 2019-07-26 DIAGNOSIS — Z7984 Long term (current) use of oral hypoglycemic drugs: Secondary | ICD-10-CM

## 2019-07-26 DIAGNOSIS — E1122 Type 2 diabetes mellitus with diabetic chronic kidney disease: Secondary | ICD-10-CM | POA: Diagnosis present

## 2019-07-26 DIAGNOSIS — Z89422 Acquired absence of other left toe(s): Secondary | ICD-10-CM

## 2019-07-26 DIAGNOSIS — R778 Other specified abnormalities of plasma proteins: Secondary | ICD-10-CM

## 2019-07-26 DIAGNOSIS — E1165 Type 2 diabetes mellitus with hyperglycemia: Secondary | ICD-10-CM | POA: Diagnosis present

## 2019-07-26 DIAGNOSIS — E876 Hypokalemia: Secondary | ICD-10-CM | POA: Diagnosis present

## 2019-07-26 DIAGNOSIS — R7989 Other specified abnormal findings of blood chemistry: Secondary | ICD-10-CM

## 2019-07-26 DIAGNOSIS — E119 Type 2 diabetes mellitus without complications: Secondary | ICD-10-CM

## 2019-07-26 DIAGNOSIS — E785 Hyperlipidemia, unspecified: Secondary | ICD-10-CM | POA: Diagnosis present

## 2019-07-26 DIAGNOSIS — I517 Cardiomegaly: Secondary | ICD-10-CM | POA: Diagnosis present

## 2019-07-26 DIAGNOSIS — J9691 Respiratory failure, unspecified with hypoxia: Secondary | ICD-10-CM | POA: Diagnosis present

## 2019-07-26 DIAGNOSIS — Z79899 Other long term (current) drug therapy: Secondary | ICD-10-CM

## 2019-07-26 DIAGNOSIS — Z992 Dependence on renal dialysis: Secondary | ICD-10-CM

## 2019-07-26 DIAGNOSIS — R0602 Shortness of breath: Secondary | ICD-10-CM

## 2019-07-26 DIAGNOSIS — F39 Unspecified mood [affective] disorder: Secondary | ICD-10-CM | POA: Diagnosis present

## 2019-07-26 DIAGNOSIS — I509 Heart failure, unspecified: Secondary | ICD-10-CM

## 2019-07-26 DIAGNOSIS — Z7982 Long term (current) use of aspirin: Secondary | ICD-10-CM

## 2019-07-26 DIAGNOSIS — Z89421 Acquired absence of other right toe(s): Secondary | ICD-10-CM

## 2019-07-26 DIAGNOSIS — J1282 Pneumonia due to coronavirus disease 2019: Secondary | ICD-10-CM | POA: Diagnosis present

## 2019-07-26 DIAGNOSIS — R34 Anuria and oliguria: Secondary | ICD-10-CM | POA: Diagnosis present

## 2019-07-26 HISTORY — DX: End stage renal disease: N18.6

## 2019-07-26 HISTORY — DX: Atherosclerotic heart disease of native coronary artery without angina pectoris: I25.10

## 2019-07-26 HISTORY — DX: Ischemic cardiomyopathy: I25.5

## 2019-07-26 HISTORY — DX: Peripheral vascular disease, unspecified: I73.9

## 2019-07-26 LAB — POC SARS CORONAVIRUS 2 AG: SARS Coronavirus 2 Ag: POSITIVE — AB

## 2019-07-26 LAB — CBC
HCT: 34.8 % — ABNORMAL LOW (ref 39.0–52.0)
Hemoglobin: 10.8 g/dL — ABNORMAL LOW (ref 13.0–17.0)
MCH: 30.5 pg (ref 26.0–34.0)
MCHC: 31 g/dL (ref 30.0–36.0)
MCV: 98.3 fL (ref 80.0–100.0)
Platelets: 153 10*3/uL (ref 150–400)
RBC: 3.54 MIL/uL — ABNORMAL LOW (ref 4.22–5.81)
RDW: 16.1 % — ABNORMAL HIGH (ref 11.5–15.5)
WBC: 3.4 10*3/uL — ABNORMAL LOW (ref 4.0–10.5)
nRBC: 0 % (ref 0.0–0.2)

## 2019-07-26 LAB — BASIC METABOLIC PANEL
Anion gap: 14 (ref 5–15)
BUN: 14 mg/dL (ref 6–20)
CO2: 28 mmol/L (ref 22–32)
Calcium: 8.3 mg/dL — ABNORMAL LOW (ref 8.9–10.3)
Chloride: 92 mmol/L — ABNORMAL LOW (ref 98–111)
Creatinine, Ser: 4.89 mg/dL — ABNORMAL HIGH (ref 0.61–1.24)
GFR calc Af Amer: 15 mL/min — ABNORMAL LOW (ref >60)
GFR calc non Af Amer: 13 mL/min — ABNORMAL LOW (ref >60)
Glucose, Bld: 100 mg/dL — ABNORMAL HIGH (ref 70–99)
Potassium: 3.1 mmol/L — ABNORMAL LOW (ref 3.5–5.1)
Sodium: 134 mmol/L — ABNORMAL LOW (ref 135–145)

## 2019-07-26 LAB — TROPONIN I (HIGH SENSITIVITY)
Troponin I (High Sensitivity): 491 ng/L (ref <18)
Troponin I (High Sensitivity): 750 ng/L (ref <18)

## 2019-07-26 LAB — LACTIC ACID, PLASMA: Lactic Acid, Venous: 2.1 mmol/L (ref 0.5–1.9)

## 2019-07-26 LAB — BRAIN NATRIURETIC PEPTIDE: B Natriuretic Peptide: 1855 pg/mL — ABNORMAL HIGH (ref 0.0–100.0)

## 2019-07-26 MED ORDER — VANCOMYCIN HCL IN DEXTROSE 1-5 GM/200ML-% IV SOLN: 1000.0000 mg | Freq: Once | INTRAVENOUS | Status: DC

## 2019-07-26 NOTE — ED Provider Notes (Signed)
Medical screening examination/treatment/procedure(s) were conducted as a shared visit with non-physician practitioner(s) and myself.  I personally evaluated the patient during the encounter.    Patient resting fairly comfortably, was febrile on initial arrival, however on recheck 98.6.  Patient denies fever but has been having cough shortness of breath and edema.  No given the patient's concern, attempts to improve his edema with dialysis changes as an outpatient, and in particular his elevated troponin we will admit him for further work-up.  He denies acute chest pain at this time, is also dialysis patient which may be contributing to his elevated troponin, however also question if there could be an element of demand ischemia.  Discussed with the patient will admit for further work-up and care.  His Covid test rapid, is currently pending but reports he is tested twice negative in the last month   Delman Kitten, MD 07/26/19 2319

## 2019-07-26 NOTE — ED Provider Notes (Signed)
[facility] Emergency Department Provider Note  ____________________________________________  Time seen: Approximately 9:24 PM  I have reviewed the triage vital signs and the nursing notes.   HISTORY  Chief Complaint Shortness of Breath and Leg Swelling    HPI Ralph Peterson is a 54 y.o. male who presents the emergency department for evaluation of shortness of breath, productive cough, edema of the bilateral lower extremities.  Patient states that approximately a month ago he started off with sinus symptoms.  Has been treated for sinus infection and bronchitis but it developed worsening symptoms.  Patient reports that the medications had not been improving his symptoms, he tested negative several times for COVID-19.  As he continued to have lower extremity edema bilaterally, findings are more concerning for congestive heart failure, he was referred to cardiology and has an appointment tomorrow.  As his symptoms have progressed, he has become more short of breath his nephrologist recommended that he present to the emergency department for further evaluation this evening.  Patient denies any fevers or chills, chest pain, domino pain, nausea vomiting.  Patient reports that he is having shortness of breath, some nasal congestion and sore throat, productive cough, bilateral lower extremity edema.  No history of congestive heart failure the patient does have a history of chronic kidney disease, coronary artery disease, diabetes, hypertension and hyperlipidemia.  Patient is on dialysis, has not missed any of his dialysis appointments and in fact had his normal dialysis earlier today.         Past Medical History:  Diagnosis Date  . Chronic kidney disease   . Coronary artery disease   . Diabetes mellitus without complication ([place])   . Hyperlipidemia   . Hypertension     Patient Active Problem List   Diagnosis Date Noted  . Complication of vascular access for  dialysis 07/30/2018  . Atherosclerosis of native arteries of extremity with intermittent claudication ([place]) 06/16/2018  . Dialysis patient ([place]) 09/10/2016  . Toe gangrene ([place]) 03/20/2016  . CAD (coronary artery disease) 11/07/2011  . Diabetes mellitus ([place]) 11/07/2011  . ESRD (end stage renal disease) ([place]) 11/07/2011  . Hyperlipidemia, unspecified 11/07/2011  . Hypertension 11/07/2011  . Stented coronary artery 11/07/2011    Past Surgical History:  Procedure Laterality Date  . ABDOMINAL AORTOGRAM N/A 09/10/2017   Procedure: ABDOMINAL AORTOGRAM;  Surgeon: Wellington Hampshire, MD;  Location: [place] [facility];  Service: Cardiovascular;  Laterality: N/A;  . AMPUTATION TOE Bilateral    one toe on right, all five toes on the left  . CARDIAC CATHETERIZATION  2012   [facility]; X1 stent 2.5 x 33 mm xience stent Distal TCA  . LOWER EXTREMITY ANGIOGRAPHY Bilateral 09/10/2017   Procedure: Lower Extremity Angiography;  Surgeon: Wellington Hampshire, MD;  Location: [place] [facility];  Service: Cardiovascular;  Laterality: Bilateral;  . LOWER EXTREMITY ANGIOGRAPHY Left 06/23/2018   Procedure: LOWER EXTREMITY ANGIOGRAPHY;  Surgeon: Katha Cabal, MD;  Location: [place] [facility];  Service: Cardiovascular;  Laterality: Left;  . PERIPHERAL VASCULAR ATHERECTOMY Left 09/10/2017   Procedure: PERIPHERAL VASCULAR ATHERECTOMY;  Surgeon: Wellington Hampshire, MD;  Location: [place] [facility];  Service: Cardiovascular;  Laterality: Left;  SFA/POPLITEAL  . PERIPHERAL VASCULAR BALLOON ANGIOPLASTY Left 09/10/2017   Procedure: PERIPHERAL VASCULAR BALLOON ANGIOPLASTY;  Surgeon: Wellington Hampshire, MD;  Location: [place] [facility];  Service: Cardiovascular;  Laterality: Left;  ANTERIAL TIBIAL  . PERIPHERAL VASCULAR CATHETERIZATION Right 03/26/2016   Procedure:  Lower Extremity Angiography;  Surgeon: Katha Cabal, MD;  Location: [place] [facility];  Service: Cardiovascular;  Laterality: Right;  .  PERIPHERAL VASCULAR INTERVENTION Left 09/10/2017   Procedure: PERIPHERAL VASCULAR INTERVENTION;  Surgeon: Wellington Hampshire, MD;  Location: [place] [facility];  Service: Cardiovascular;  Laterality: Left;  SFA/POPLITEAL    Prior to Admission medications   Medication Sig Start Date End Date Taking? Authorizing Provider  acetaminophen (TYLENOL) 500 MG tablet Take 1,000 mg by mouth 2 (two) times daily as needed for moderate pain.   Yes [provider]  aspirin EC 81 MG tablet Take 81 mg by mouth every evening.   Yes [provider]  atorvastatin (LIPITOR) 40 MG tablet Take 40 mg by mouth every evening.  03/31/18  Yes [provider]  carvedilol (COREG) 25 MG tablet Take 25 mg by mouth 2 (two) times daily with a meal.  06/18/13  Yes [provider]  clopidogrel (PLAVIX) 75 MG tablet TAKE 1 TABLET BY MOUTH ONCE DAILY. 09/16/18  Yes Minna Merritts, MD  diphenhydrAMINE (BENADRYL) 25 MG tablet Take 25 mg by mouth at bedtime as needed for allergies.   Yes [provider]  escitalopram (LEXAPRO) 10 MG tablet Take 10 mg by mouth daily.    Yes [provider]  fluticasone (FLONASE) 50 MCG/ACT nasal spray Place 2 sprays into both nostrils every evening.    Yes [provider]  glipiZIDE (GLUCOTROL) 5 MG tablet Take 5 mg by mouth daily before breakfast.  01/01/19  Yes [provider]  sevelamer carbonate (RENVELA) 800 MG tablet Take 1,600-3,200 mg by mouth See admin instructions. Take 3200 mg with meals / 1600 mg with snacks   Yes [provider]  amLODipine (NORVASC) 5 MG tablet Take 5 mg by mouth every evening.  03/31/18   [provider]  cetirizine (ZYRTEC) 10 MG tablet Take 10 mg by mouth every evening.  05/21/18   [provider]  lisinopril (PRINIVIL,ZESTRIL) 2.5 MG tablet Take 2.5 mg by mouth every evening.     [provider]    Allergies Penicillins  Family History  Problem Relation Age of  Onset  . Hypertension Mother     Social History Social History   Tobacco Use  . Smoking status: Never Smoker  . Smokeless tobacco: Never Used  Substance Use Topics  . Alcohol use: No  . Drug use: No     Review of Systems  Constitutional: No fever/chills Eyes: No visual changes. No discharge ENT: Positive for nasal congestion and sore throat. Cardiovascular: no chest pain. Respiratory: no cough.  Positive SOB. Gastrointestinal: No abdominal pain.  No nausea, no vomiting.  No diarrhea.  No constipation. Genitourinary: Negative for dysuria. No hematuria Musculoskeletal: Negative for musculoskeletal pain. Skin: Negative for rash, abrasions, lacerations, ecchymosis. Neurological: Negative for headaches, focal weakness or numbness. 10-point ROS otherwise negative.  ____________________________________________   PHYSICAL EXAM:  VITAL SIGNS: ED Triage Vitals  Enc Vitals Group     BP 07/26/19 1954 107/77     Pulse Rate 07/26/19 1954 93     Resp 07/26/19 1954 18     Temp 07/26/19 1954 (!) 100.7 F (38.2 C)     Temp Source 07/26/19 1954 Oral     SpO2 07/26/19 1953 92 %     Weight 07/26/19 1955 246 lb 14.6 oz (112 kg)     Height 07/26/19 1955 6' (1.829 m)     Head Circumference --  Peak Flow --      Pain Score 07/26/19 1955 0     Pain Loc --      Pain Edu? --      Excl. in [place]? --      Constitutional: Alert and oriented. Well appearing and in no acute distress. Eyes: Conjunctivae are normal. PERRL. EOMI. Head: Atraumatic. ENT:      Ears:       Nose: No congestion/rhinnorhea.      Mouth/Throat: Mucous membranes are moist.  Neck: No stridor.  Neck supple full range of motion Hematological/Lymphatic/Immunilogical: No cervical lymphadenopathy. Cardiovascular: Normal rate, regular rhythm. Normal S1 and S2.  No muffled heart sounds.  Good peripheral circulation.  Patient does have bilateral lower extremity edema.  Nonpitting in nature.  Dorsalis pedis pulse intact  bilateral lower extremities. Respiratory: Normal respiratory effort without tachypnea or retractions. Lungs with a few faint crackles, however no frank wheezing, rales or rhonchi.Kermit Balo air entry to the bases with no decreased or absent breath sounds. Gastrointestinal: Bowel sounds 4 quadrants. Soft and nontender to palpation. No guarding or rigidity. No palpable masses. No distention. No CVA tenderness. Musculoskeletal: Full range of motion to all extremities. No gross deformities appreciated. Neurologic:  Normal speech and language. No gross focal neurologic deficits are appreciated.  Skin:  Skin is warm, dry and intact. No rash noted. Psychiatric: Mood and affect are normal. Speech and behavior are normal. Patient exhibits appropriate insight and judgement.   ____________________________________________   LABS (all labs ordered are listed, but only abnormal results are displayed)  Labs Reviewed  CBC - Abnormal; Notable for the following components:      Result Value   WBC 3.4 (*)    RBC 3.54 (*)    Hemoglobin 10.8 (*)    HCT 34.8 (*)    RDW 16.1 (*)    All other components within normal limits  BASIC METABOLIC PANEL - Abnormal; Notable for the following components:   Sodium 134 (*)    Potassium 3.1 (*)    Chloride 92 (*)    Glucose, Bld 100 (*)    Creatinine, Ser 4.89 (*)    Calcium 8.3 (*)    GFR calc non Af Amer 13 (*)    GFR calc Af Amer 15 (*)    All other components within normal limits  LACTIC ACID, PLASMA - Abnormal; Notable for the following components:   Lactic Acid, Venous 2.1 (*)    All other components within normal limits  BRAIN NATRIURETIC PEPTIDE - Abnormal; Notable for the following components:   B Natriuretic Peptide 1,855.0 (*)    All other components within normal limits  POC SARS CORONAVIRUS 2 AG - Abnormal; Notable for the following components:   SARS Coronavirus 2 Ag POSITIVE (*)    All other components within normal limits  TROPONIN I (HIGH  SENSITIVITY) - Abnormal; Notable for the following components:   Troponin I (High Sensitivity) 491 (*)    All other components within normal limits  TROPONIN I (HIGH SENSITIVITY) - Abnormal; Notable for the following components:   Troponin I (High Sensitivity) 750 (*)    All other components within normal limits  CULTURE, BLOOD (ROUTINE X 2)  CULTURE, BLOOD (ROUTINE X 2)  URINALYSIS, COMPLETE (UACMP) WITH MICROSCOPIC  LACTIC ACID, PLASMA  POC SARS CORONAVIRUS 2 AG -  ED   ____________________________________________  EKG  ED ECG REPORT I, Charline Bills Ariz Terrones,  personally viewed and interpreted this ECG.   Date: 07/26/2019  EKG Time: 2003 hrs.  Rate: 85 bpm  Rhythm: normal sinus rhythm, RBBB, occasional PVC noted, unifocal  Axis: Left axis deviation  Intervals:right bundle branch block  ST&T Change: No ST elevation or depression.  Normal sinus rhythm.  Right bundle branch block visualized previously when EKGs.  No significant change from previous EKGs.  No STEMI.  ____________________________________________  RADIOLOGY I personally viewed and evaluated these images as part of my medical decision making, as well as reviewing the written report by the radiologist.  DG Chest Port 1 View  Result Date: 07/26/2019 CLINICAL DATA:  Leg swelling for 2 weeks with shortness of breath EXAM: PORTABLE CHEST 1 VIEW COMPARISON:  12/01/2007 FINDINGS: Cardiac shadow is mildly prominent but accentuated by the portable technique. Elevation the right hemidiaphragm is seen. Mild vascular congestion is noted without focal infiltrate or parenchymal edema. No acute bony abnormality is seen. IMPRESSION: Mild vascular congestion. Electronically Signed   By: Inez Catalina M.D.   On: 07/26/2019 21:25    ____________________________________________    PROCEDURES  Procedure(s) performed:    Procedures    Medications - No data to  display   ____________________________________________   INITIAL IMPRESSION / ASSESSMENT AND PLAN / ED COURSE  Pertinent labs & imaging results that were available during my care of the patient were reviewed by me and considered in my medical decision making (see chart for details).  Review of the [place] CSRS was performed in accordance of the [place] prior to dispensing any controlled drugs.           Patient's diagnosis is consistent with stage renal disease, shortness of breath, congestive heart failure, elevated lactic acid, COVID-19.  Patient presented to emergency department complaining of shortness of breath, peripheral edema, productive cough.  Patient states that he had had a viral-like illness a month ago, had tested negative multiple times for COVID-19.  Patient is in end-stage renal disease on dialysis.  Patient states that as his symptoms have progressed to include worsening shortness of breath, peripheral edema he was sent to emergency department for evaluation.  Patient had a fever on arrival, complains of ongoing shortness of breath and peripheral edema.  Patient had an elevated troponin, elevated BNP as well as an elevated lactic acid.  Unfortunately, patient's COVID-19 test here in the emergency department was positive.  At this time, patient would be a good candidate for admission for new onset CHF work-up, as well as evaluation for fever and elevated lactic acid as well as COVID-19 infection.  Given respiratory complaints, patient will be treated with IV Vancomycin to cover for pneumonia, however concern exists for bacteremia as well..     ____________________________________________  FINAL CLINICAL IMPRESSION(S) / ED DIAGNOSES  Final diagnoses:  ESRD (end stage renal disease) on dialysis ([place])  Shortness of breath  Acute congestive heart failure, unspecified heart failure type (HCC)  Elevated lactic acid level  COVID-19      NEW MEDICATIONS STARTED DURING THIS  VISIT:  ED Discharge Orders    None          This chart was dictated using voice recognition software/Dragon. Despite best efforts to proofread, errors can occur which can change the meaning. Any change was purely unintentional.    Darletta Moll, PA-C 07/27/19 [id number]    Delman Kitten, MD 08/04/19 1730

## 2019-07-26 NOTE — ED Triage Notes (Signed)
Pt to ED from home. Pt states bilateral leg swelling x2 wks. Pt states some intermittent nausea and SOB w/ exertion and sometimes at rest. Pt denies V/D, CP and fever. Pt states he is med compliant. Pt is dialysis pt that is M/W/F and states he went today.  Dialysis clinic referred pt to ER to have chest x-ray.

## 2019-07-26 NOTE — ED Notes (Signed)
ED Provider at bedside. 

## 2019-07-27 ENCOUNTER — Inpatient Hospital Stay
Admit: 2019-07-27 | Discharge: 2019-07-27 | Disposition: A | Discharge status: Home / Self Care | Payer: Medicare PPO | Attending: Internal Medicine | Admitting: Internal Medicine

## 2019-07-27 DIAGNOSIS — U071 COVID-19: Secondary | ICD-10-CM | POA: Diagnosis present

## 2019-07-27 DIAGNOSIS — I4510 Unspecified right bundle-branch block: Secondary | ICD-10-CM | POA: Diagnosis present

## 2019-07-27 DIAGNOSIS — E1165 Type 2 diabetes mellitus with hyperglycemia: Secondary | ICD-10-CM | POA: Diagnosis present

## 2019-07-27 DIAGNOSIS — I25118 Atherosclerotic heart disease of native coronary artery with other forms of angina pectoris: Secondary | ICD-10-CM | POA: Diagnosis not present

## 2019-07-27 DIAGNOSIS — I5020 Unspecified systolic (congestive) heart failure: Secondary | ICD-10-CM | POA: Diagnosis present

## 2019-07-27 DIAGNOSIS — I25119 Atherosclerotic heart disease of native coronary artery with unspecified angina pectoris: Secondary | ICD-10-CM | POA: Diagnosis present

## 2019-07-27 DIAGNOSIS — F39 Unspecified mood [affective] disorder: Secondary | ICD-10-CM | POA: Diagnosis present

## 2019-07-27 DIAGNOSIS — Z7902 Long term (current) use of antithrombotics/antiplatelets: Secondary | ICD-10-CM | POA: Diagnosis not present

## 2019-07-27 DIAGNOSIS — E785 Hyperlipidemia, unspecified: Secondary | ICD-10-CM | POA: Diagnosis present

## 2019-07-27 DIAGNOSIS — R0602 Shortness of breath: Secondary | ICD-10-CM

## 2019-07-27 DIAGNOSIS — E876 Hypokalemia: Secondary | ICD-10-CM | POA: Diagnosis present

## 2019-07-27 DIAGNOSIS — Z8249 Family history of ischemic heart disease and other diseases of the circulatory system: Secondary | ICD-10-CM | POA: Diagnosis not present

## 2019-07-27 DIAGNOSIS — I214 Non-ST elevation (NSTEMI) myocardial infarction: Secondary | ICD-10-CM | POA: Diagnosis present

## 2019-07-27 DIAGNOSIS — Z79899 Other long term (current) drug therapy: Secondary | ICD-10-CM | POA: Diagnosis not present

## 2019-07-27 DIAGNOSIS — I10 Essential (primary) hypertension: Secondary | ICD-10-CM

## 2019-07-27 DIAGNOSIS — I132 Hypertensive heart and chronic kidney disease with heart failure and with stage 5 chronic kidney disease, or end stage renal disease: Secondary | ICD-10-CM | POA: Diagnosis present

## 2019-07-27 DIAGNOSIS — I509 Heart failure, unspecified: Secondary | ICD-10-CM | POA: Diagnosis not present

## 2019-07-27 DIAGNOSIS — Z992 Dependence on renal dialysis: Secondary | ICD-10-CM | POA: Diagnosis not present

## 2019-07-27 DIAGNOSIS — N186 End stage renal disease: Secondary | ICD-10-CM | POA: Diagnosis not present

## 2019-07-27 DIAGNOSIS — Z89422 Acquired absence of other left toe(s): Secondary | ICD-10-CM | POA: Diagnosis not present

## 2019-07-27 DIAGNOSIS — R778 Other specified abnormalities of plasma proteins: Secondary | ICD-10-CM

## 2019-07-27 DIAGNOSIS — Z89421 Acquired absence of other right toe(s): Secondary | ICD-10-CM | POA: Diagnosis not present

## 2019-07-27 DIAGNOSIS — J9691 Respiratory failure, unspecified with hypoxia: Secondary | ICD-10-CM | POA: Diagnosis present

## 2019-07-27 DIAGNOSIS — Z7984 Long term (current) use of oral hypoglycemic drugs: Secondary | ICD-10-CM | POA: Diagnosis not present

## 2019-07-27 DIAGNOSIS — Z7982 Long term (current) use of aspirin: Secondary | ICD-10-CM | POA: Diagnosis not present

## 2019-07-27 DIAGNOSIS — J1282 Pneumonia due to coronavirus disease 2019: Secondary | ICD-10-CM | POA: Diagnosis present

## 2019-07-27 DIAGNOSIS — E1122 Type 2 diabetes mellitus with diabetic chronic kidney disease: Secondary | ICD-10-CM | POA: Diagnosis not present

## 2019-07-27 DIAGNOSIS — E1152 Type 2 diabetes mellitus with diabetic peripheral angiopathy with gangrene: Secondary | ICD-10-CM | POA: Diagnosis present

## 2019-07-27 DIAGNOSIS — R7989 Other specified abnormal findings of blood chemistry: Secondary | ICD-10-CM | POA: Insufficient documentation

## 2019-07-27 DIAGNOSIS — R05 Cough: Secondary | ICD-10-CM | POA: Diagnosis not present

## 2019-07-27 DIAGNOSIS — R34 Anuria and oliguria: Secondary | ICD-10-CM | POA: Diagnosis present

## 2019-07-27 LAB — HEMOGLOBIN A1C
Hgb A1c MFr Bld: 8.5 % — ABNORMAL HIGH (ref 4.8–5.6)
Mean Plasma Glucose: 197.25 mg/dL

## 2019-07-27 LAB — LACTATE DEHYDROGENASE: LDH: 230 U/L — ABNORMAL HIGH (ref 98–192)

## 2019-07-27 LAB — FIBRINOGEN: Fibrinogen: 375 mg/dL (ref 210–475)

## 2019-07-27 LAB — PROCALCITONIN: Procalcitonin: 0.97 ng/mL

## 2019-07-27 LAB — ECHOCARDIOGRAM COMPLETE
Height: 72 in
Weight: 3950.64 oz

## 2019-07-27 LAB — HIV ANTIBODY (ROUTINE TESTING W REFLEX): HIV Screen 4th Generation wRfx: NONREACTIVE

## 2019-07-27 LAB — C-REACTIVE PROTEIN: CRP: 1.5 mg/dL — ABNORMAL HIGH

## 2019-07-27 LAB — ABO/RH: ABO/RH(D): O POS

## 2019-07-27 LAB — POCT CBG MONITORING: CBG: 141

## 2019-07-27 LAB — GLUCOSE, CAPILLARY
Glucose-Capillary: 121 mg/dL — ABNORMAL HIGH (ref 70–99)
Glucose-Capillary: 141 mg/dL — ABNORMAL HIGH (ref 70–99)
Glucose-Capillary: 374 mg/dL — ABNORMAL HIGH (ref 70–99)

## 2019-07-27 LAB — D-DIMER, QUANTITATIVE: D-Dimer, Quant: 0.85 ug{FEU}/mL — ABNORMAL HIGH (ref 0.00–0.50)

## 2019-07-27 LAB — LACTIC ACID, PLASMA: Lactic Acid, Venous: 1.7 mmol/L (ref 0.5–1.9)

## 2019-07-27 LAB — TROPONIN I (HIGH SENSITIVITY): Troponin I (High Sensitivity): 1143 ng/L (ref <18)

## 2019-07-27 LAB — FERRITIN: Ferritin: 2481 ng/mL — ABNORMAL HIGH (ref 24–336)

## 2019-07-27 MED ORDER — SODIUM CHLORIDE 0.9 % IV SOLN
200.0000 mg | Freq: Once | INTRAVENOUS | Status: AC
  Administered 2019-07-27: 01:00:00 200 mg via INTRAVENOUS
  Filled 2019-07-27: qty 200

## 2019-07-27 MED ORDER — SEVELAMER CARBONATE 800 MG PO TABS: 1600.0000 mg | ORAL_TABLET | ORAL | Status: DC

## 2019-07-27 MED ORDER — INSULIN GLARGINE 100 UNIT/ML ~~LOC~~ SOLN
10.0000 [IU] | Freq: Every day | SUBCUTANEOUS | Status: DC
  Administered 2019-07-27 – 2019-07-28 (×2): 10 [IU] via SUBCUTANEOUS
  Filled 2019-07-27 (×3): qty 0.1

## 2019-07-27 MED ORDER — AMLODIPINE BESYLATE 5 MG PO TABS
5.0000 mg | ORAL_TABLET | Freq: Every evening | ORAL | Status: DC
  Filled 2019-07-27: qty 1

## 2019-07-27 MED ORDER — IPRATROPIUM-ALBUTEROL 20-100 MCG/ACT IN AERS
1.0000 | INHALATION_SPRAY | Freq: Four times a day (QID) | RESPIRATORY_TRACT | Status: DC
  Administered 2019-07-27 – 2019-07-29 (×8): 1 via RESPIRATORY_TRACT
  Filled 2019-07-27: qty 4

## 2019-07-27 MED ORDER — SODIUM CHLORIDE 0.9 % IV SOLN
100.0000 mg | Freq: Every day | INTRAVENOUS | Status: DC
  Administered 2019-07-28 – 2019-07-29 (×2): 100 mg via INTRAVENOUS
  Filled 2019-07-27: qty 100
  Filled 2019-07-27: qty 20

## 2019-07-27 MED ORDER — ATORVASTATIN CALCIUM 20 MG PO TABS
40.0000 mg | ORAL_TABLET | Freq: Every evening | ORAL | Status: DC
  Administered 2019-07-27: 40 mg via ORAL
  Filled 2019-07-27: qty 2

## 2019-07-27 MED ORDER — GUAIFENESIN-DM 100-10 MG/5ML PO SYRP
10.0000 mL | ORAL_SOLUTION | ORAL | Status: DC | PRN
  Administered 2019-07-27: 10 mL via ORAL
  Filled 2019-07-27 (×2): qty 10

## 2019-07-27 MED ORDER — DEXAMETHASONE SODIUM PHOSPHATE 10 MG/ML IJ SOLN
6.0000 mg | INTRAMUSCULAR | Status: DC
  Administered 2019-07-27 – 2019-07-29 (×3): 6 mg via INTRAVENOUS
  Filled 2019-07-27 (×3): qty 1

## 2019-07-27 MED ORDER — LISINOPRIL 5 MG PO TABS
2.5000 mg | ORAL_TABLET | Freq: Every evening | ORAL | Status: DC
  Administered 2019-07-27: 2.5 mg via ORAL
  Filled 2019-07-27 (×2): qty 1

## 2019-07-27 MED ORDER — INSULIN ASPART 100 UNIT/ML ~~LOC~~ SOLN
0.0000 [IU] | Freq: Three times a day (TID) | SUBCUTANEOUS | Status: DC
  Administered 2019-07-28: 12:00:00 11 [IU] via SUBCUTANEOUS
  Administered 2019-07-28 – 2019-07-29 (×2): 8 [IU] via SUBCUTANEOUS
  Filled 2019-07-27 (×3): qty 1

## 2019-07-27 MED ORDER — INSULIN ASPART 100 UNIT/ML ~~LOC~~ SOLN
0.0000 [IU] | Freq: Three times a day (TID) | SUBCUTANEOUS | Status: DC
  Administered 2019-07-27: 9 [IU] via SUBCUTANEOUS
  Administered 2019-07-27: 1 [IU] via SUBCUTANEOUS
  Filled 2019-07-27 (×2): qty 1

## 2019-07-27 MED ORDER — LOPERAMIDE HCL 2 MG PO CAPS
2.0000 mg | ORAL_CAPSULE | ORAL | Status: DC | PRN
  Administered 2019-07-27 – 2019-07-28 (×5): 2 mg via ORAL
  Filled 2019-07-27 (×5): qty 1

## 2019-07-27 MED ORDER — ASPIRIN EC 81 MG PO TBEC
81.0000 mg | DELAYED_RELEASE_TABLET | Freq: Every evening | ORAL | Status: DC
  Administered 2019-07-27: 81 mg via ORAL
  Filled 2019-07-27: qty 1

## 2019-07-27 MED ORDER — CLOPIDOGREL BISULFATE 75 MG PO TABS
75.0000 mg | ORAL_TABLET | Freq: Every day | ORAL | Status: DC
  Administered 2019-07-27 – 2019-07-29 (×3): 75 mg via ORAL
  Filled 2019-07-27 (×3): qty 1

## 2019-07-27 MED ORDER — ESCITALOPRAM OXALATE 10 MG PO TABS
10.0000 mg | ORAL_TABLET | Freq: Every day | ORAL | Status: DC
  Administered 2019-07-27 – 2019-07-29 (×3): 10 mg via ORAL
  Filled 2019-07-27 (×3): qty 1

## 2019-07-27 MED ORDER — SODIUM CHLORIDE 0.9 % IV SOLN: 100.0000 mg | Freq: Every day | INTRAVENOUS | Status: DC

## 2019-07-27 MED ORDER — HYDROCOD POLST-CPM POLST ER 10-8 MG/5ML PO SUER: 5.0000 mL | Freq: Two times a day (BID) | ORAL | Status: DC | PRN

## 2019-07-27 MED ORDER — POTASSIUM CHLORIDE CRYS ER 20 MEQ PO TBCR
40.0000 meq | EXTENDED_RELEASE_TABLET | Freq: Once | ORAL | Status: AC
  Administered 2019-07-27: 04:00:00 40 meq via ORAL
  Filled 2019-07-27: qty 2

## 2019-07-27 MED ORDER — ENOXAPARIN SODIUM 30 MG/0.3ML ~~LOC~~ SOLN
30.0000 mg | SUBCUTANEOUS | Status: DC
  Administered 2019-07-27: 30 mg via SUBCUTANEOUS
  Filled 2019-07-27: qty 0.3

## 2019-07-27 MED ORDER — HEPARIN SODIUM (PORCINE) 5000 UNIT/ML IJ SOLN: 5000.0000 [IU] | Freq: Three times a day (TID) | INTRAMUSCULAR | Status: DC

## 2019-07-27 MED ORDER — CARVEDILOL 25 MG PO TABS
25.0000 mg | ORAL_TABLET | Freq: Two times a day (BID) | ORAL | Status: DC
  Administered 2019-07-27 – 2019-07-29 (×3): 25 mg via ORAL
  Filled 2019-07-27 (×4): qty 1

## 2019-07-27 MED ORDER — INSULIN ASPART 100 UNIT/ML ~~LOC~~ SOLN
0.0000 [IU] | Freq: Every day | SUBCUTANEOUS | Status: DC
  Administered 2019-07-27: 23:00:00 3 [IU] via SUBCUTANEOUS

## 2019-07-27 MED ORDER — SEVELAMER CARBONATE 800 MG PO TABS
1600.0000 mg | ORAL_TABLET | ORAL | Status: DC
  Administered 2019-07-28: 22:00:00 1600 mg via ORAL
  Filled 2019-07-27 (×10): qty 2

## 2019-07-27 MED ORDER — SEVELAMER CARBONATE 800 MG PO TABS
3200.0000 mg | ORAL_TABLET | Freq: Three times a day (TID) | ORAL | Status: DC
  Administered 2019-07-27 – 2019-07-29 (×6): 3200 mg via ORAL
  Filled 2019-07-27 (×10): qty 4

## 2019-07-27 MED ORDER — GLIPIZIDE 5 MG PO TABS
5.0000 mg | ORAL_TABLET | Freq: Every day | ORAL | Status: DC
  Filled 2019-07-27: qty 1

## 2019-07-27 MED ORDER — ACETAMINOPHEN 325 MG PO TABS: 650.0000 mg | ORAL_TABLET | Freq: Four times a day (QID) | ORAL | Status: DC | PRN

## 2019-07-27 MED ORDER — SODIUM CHLORIDE 0.9 % IV SOLN: 200.0000 mg | Freq: Once | INTRAVENOUS | Status: DC

## 2019-07-27 NOTE — ED Notes (Signed)
ED TO INPATIENT HANDOFF REPORT  ED Nurse Name and Phone #: Karena Addison [phone]  S Name/Age/Gender Ralph Peterson 54 y.o. male Room/Bed: ED30A/ED30A  Code Status   Code Status: Full Code  Home/SNF/Other Home Patient oriented to: self, place, time and situation Is this baseline? Yes   Triage Complete: Triage complete  Chief Complaint COVID-19 [U07.1]  Triage Note Pt to ED from home. Pt states bilateral leg swelling x2 wks. Pt states some intermittent nausea and SOB w/ exertion and sometimes at rest. Pt denies V/D, CP and fever. Pt states he is med compliant. Pt is dialysis pt that is M/W/F and states he went today.  Dialysis clinic referred pt to ER to have chest x-ray.     Allergies Allergies  Allergen Reactions  . Penicillins Rash    Has patient had a PCN reaction causing immediate rash, facial/tongue/throat swelling, SOB or lightheadedness with hypotension: Yes Has patient had a PCN reaction causing severe rash involving mucus membranes or skin necrosis: Yes Has patient had a PCN reaction that required hospitalization: No Has patient had a PCN reaction occurring within the last 10 years: No If all of the above answers are "NO", then may proceed with Cephalosporin use.     Level of Care/Admitting Diagnosis ED Disposition    ED Disposition Condition [facility] Area: [place] [[id number]]  Level of Care: Telemetry [5]  Covid Evaluation: Confirmed COVID Positive  Diagnosis: COVID-19 [id number]  Admitting Physician: Nicolette Bang [id number]  Attending Physician: Nicolette Bang [id number]  Estimated length of stay: past midnight tomorrow  Certification:: I certify this patient will need inpatient services for at least 2 midnights       B Medical/Surgery History Past Medical History:  Diagnosis Date  . Chronic kidney disease   . Coronary artery disease   . Diabetes mellitus without complication ([place])   .  Hyperlipidemia   . Hypertension    Past Surgical History:  Procedure Laterality Date  . ABDOMINAL AORTOGRAM N/A 09/10/2017   Procedure: ABDOMINAL AORTOGRAM;  Surgeon: Wellington Hampshire, MD;  Location: [place] [facility];  Service: Cardiovascular;  Laterality: N/A;  . AMPUTATION TOE Bilateral    one toe on right, all five toes on the left  . CARDIAC CATHETERIZATION  2012   [facility]; X1 stent 2.5 x 33 mm xience stent Distal TCA  . LOWER EXTREMITY ANGIOGRAPHY Bilateral 09/10/2017   Procedure: Lower Extremity Angiography;  Surgeon: Wellington Hampshire, MD;  Location: [place] [facility];  Service: Cardiovascular;  Laterality: Bilateral;  . LOWER EXTREMITY ANGIOGRAPHY Left 06/23/2018   Procedure: LOWER EXTREMITY ANGIOGRAPHY;  Surgeon: Katha Cabal, MD;  Location: [place] [facility];  Service: Cardiovascular;  Laterality: Left;  . PERIPHERAL VASCULAR ATHERECTOMY Left 09/10/2017   Procedure: PERIPHERAL VASCULAR ATHERECTOMY;  Surgeon: Wellington Hampshire, MD;  Location: [place] [facility];  Service: Cardiovascular;  Laterality: Left;  SFA/POPLITEAL  . PERIPHERAL VASCULAR BALLOON ANGIOPLASTY Left 09/10/2017   Procedure: PERIPHERAL VASCULAR BALLOON ANGIOPLASTY;  Surgeon: Wellington Hampshire, MD;  Location: [place] [facility];  Service: Cardiovascular;  Laterality: Left;  ANTERIAL TIBIAL  . PERIPHERAL VASCULAR CATHETERIZATION Right 03/26/2016   Procedure: Lower Extremity Angiography;  Surgeon: Katha Cabal, MD;  Location: [place] [facility];  Service: Cardiovascular;  Laterality: Right;  . PERIPHERAL VASCULAR INTERVENTION Left 09/10/2017   Procedure: PERIPHERAL VASCULAR INTERVENTION;  Surgeon: Wellington Hampshire, MD;  Location: [place] [facility];  Service:  Cardiovascular;  Laterality: Left;  SFA/POPLITEAL     A IV Location/Drains/Wounds Patient Lines/Drains/Airways Status   Active Line/Drains/Airways    Name:   Placement date:   Placement time:   Site:   Days:   Peripheral IV 07/26/19  Left Wrist   07/26/19    2244    Wrist   1          Intake/Output Last 24 hours No intake or output data in the 24 hours ending 07/27/19 1152  Labs/Imaging Results for orders placed or performed during the hospital encounter of 07/26/19 (from the past 48 hour(s))  CBC     Status: Abnormal   Collection Time: 07/26/19  7:59 PM  Result Value Ref Range   WBC 3.4 (L) 4.0 - 10.5 K/uL   RBC 3.54 (L) 4.22 - 5.81 MIL/uL   Hemoglobin 10.8 (L) 13.0 - 17.0 g/dL   HCT 34.8 (L) 39.0 - 52.0 %   MCV 98.3 80.0 - 100.0 fL   MCH 30.5 26.0 - 34.0 pg   MCHC 31.0 30.0 - 36.0 g/dL   RDW 16.1 (H) 11.5 - 15.5 %   Platelets 153 150 - 400 K/uL   nRBC 0.0 0.0 - 0.2 %    Comment: Performed at [facility], [address] [place], [place] [id number]  Basic metabolic panel     Status: Abnormal   Collection Time: 07/26/19  7:59 PM  Result Value Ref Range   Sodium 134 (L) 135 - 145 mmol/L   Potassium 3.1 (L) 3.5 - 5.1 mmol/L   Chloride 92 (L) 98 - 111 mmol/L   CO2 28 22 - 32 mmol/L   Glucose, Bld 100 (H) 70 - 99 mg/dL   BUN 14 6 - 20 mg/dL   Creatinine, Ser 4.89 (H) 0.61 - 1.24 mg/dL   Calcium 8.3 (L) 8.9 - 10.3 mg/dL   GFR calc non Af Amer 13 (L) >60 mL/min   GFR calc Af Amer 15 (L) >60 mL/min   Anion gap 14 5 - 15    Comment: Performed at [facility], [address] [place], [place] [address]  Brain natriuretic peptide     Status: Abnormal   Collection Time: 07/26/19  7:59 PM  Result Value Ref Range   B Natriuretic Peptide 1,855.0 (H) 0.0 - 100.0 pg/mL    Comment: Performed at [facility], [address] [place], [place] [address]  ABO/Rh     Status: None   Collection Time: 07/26/19  7:59 PM  Result Value Ref Range   ABO/RH(D)      O POS Performed at [facility], [place], [place] [address]   Troponin I (High Sensitivity)     Status: Abnormal   Collection Time: 07/26/19  8:00 PM  Result Value Ref Range   Troponin I (High Sensitivity)  491 (HH) <18 ng/L    Comment: CRITICAL RESULT CALLED TO, READ BACK BY AND VERIFIED WITH LISA THOMPSON AT 2056 07/26/2019  TFK (NOTE) Elevated high sensitivity troponin I (hsTnI) values and significant  changes across serial measurements may suggest ACS but many other  chronic and acute conditions are known to elevate hsTnI results.  Refer to the "Links" section for chest pain algorithms and additional  guidance. Performed at [facility], [place],  [address]   Troponin I (High Sensitivity)     Status: Abnormal   Collection Time: 07/26/19 10:40 PM  Result Value Ref Range  Troponin I (High Sensitivity) 750 (HH) <18 ng/L    Comment: CRITICAL VALUE NOTED. VALUE IS CONSISTENT WITH PREVIOUSLY REPORTED/CALLED VALUE RWW (NOTE) Elevated high sensitivity troponin I (hsTnI) values and significant  changes across serial measurements may suggest ACS but many other  chronic and acute conditions are known to elevate hsTnI results.  Refer to the "Links" section for chest pain algorithms and additional  guidance. Performed at [facility], [address] [place], [place] [address]   Culture, blood (routine x 2)     Status: None (Preliminary result)   Collection Time: 07/26/19 10:40 PM   Specimen: BLOOD  Result Value Ref Range   Specimen Description BLOOD BLOOD LEFT WRIST    Special Requests      BOTTLES DRAWN AEROBIC AND ANAEROBIC Blood Culture adequate volume   Culture      NO GROWTH < 12 HOURS Performed at [facility], [address] [place], [place] [address]    Report Status PENDING   Culture, blood (routine x 2)     Status: None (Preliminary result)   Collection Time: 07/26/19 10:40 PM   Specimen: BLOOD  Result Value Ref Range   Specimen Description BLOOD BLOOD LEFT HAND    Special Requests      BOTTLES DRAWN AEROBIC AND ANAEROBIC Blood Culture adequate volume   Culture      NO GROWTH < 12 HOURS Performed at [facility], [address], [place], [place] [address]    Report Status PENDING   Lactic acid, plasma     Status: Abnormal   Collection Time: 07/26/19 10:40 PM  Result Value Ref Range   Lactic Acid, Venous 2.1 (HH) 0.5 - 1.9 mmol/L    Comment: CRITICAL RESULT CALLED TO, READ BACK BY AND VERIFIED WITH STEPHEN JONES AT 2313 ON 07/26/19 RWW Performed at [facility], [address] [place], [place] [address]   POC SARS Coronavirus 2 Ag     Status: Abnormal   Collection Time: 07/26/19 11:34 PM  Result Value Ref Range   SARS Coronavirus 2 Ag POSITIVE (A) NEGATIVE    Comment: (NOTE) SARS-CoV-2 antigen PRESENT. Positive results indicate the presence of viral antigens, but clinical correlation with patient history and other diagnostic information is necessary to determine patient infection status.  Positive results do not rule out bacterial infection or co-infection  with other viruses. False positive results are rare but can occur, and confirmatory RT-PCR testing may be appropriate in some circumstances. The expected result is Negative. Fact Sheet for Patients: PodPark.tn Fact Sheet for Providers: GiftContent.is  This test is not yet approved or cleared by the [place] FDA and  has been authorized for detection and/or diagnosis of SARS-CoV-2 by FDA under an Emergency Use Authorization (EUA).  This EUA will remain in effect (meaning this test can be used) for the duration of  the COVID-19 declaration under Section 564(b)(1) of the Act, 21 U.S.C. section 360bbb-3(b)(1), unless the a uthorization is terminated or revoked sooner.   Lactic acid, plasma     Status: None   Collection Time: 07/27/19 12:02 AM  Result Value Ref Range   Lactic Acid, Venous 1.7 0.5 - 1.9 mmol/L    Comment: Performed at [facility], [address] [place], [place] [address]  Troponin I (High Sensitivity)     Status: Abnormal    Collection Time: 07/27/19  3:29 AM  Result Value Ref Range   Troponin I (High Sensitivity) 1,143 (HH) <18 ng/L  Comment: CRITICAL VALUE NOTED. VALUE IS CONSISTENT WITH PREVIOUSLY REPORTED/CALLED VALUE  SDR (NOTE) Elevated high sensitivity troponin I (hsTnI) values and significant  changes across serial measurements may suggest ACS but many other  chronic and acute conditions are known to elevate hsTnI results.  Refer to the "Links" section for chest pain algorithms and additional  guidance. Performed at [facility], [address] [place], [place] [address]   C-reactive protein     Status: Abnormal   Collection Time: 07/27/19  3:29 AM  Result Value Ref Range   CRP 1.5 (H) <1.0 mg/dL    Comment: Performed at [facility] [address], [place], [place] [address]  Lactate dehydrogenase     Status: Abnormal   Collection Time: 07/27/19  3:29 AM  Result Value Ref Range   LDH 230 (H) 98 - 192 U/L    Comment: Performed at [facility], [address] [place], [place] [address]  Procalcitonin     Status: None   Collection Time: 07/27/19  3:29 AM  Result Value Ref Range   Procalcitonin 0.97 ng/mL    Comment:        Interpretation: PCT > 0.5 ng/mL and <= 2 ng/mL: Systemic infection (sepsis) is possible, but other conditions are known to elevate PCT as well. (NOTE)       Sepsis PCT Algorithm           Lower Respiratory Tract                                      Infection PCT Algorithm    ----------------------------     ----------------------------         PCT < 0.25 ng/mL                PCT < 0.10 ng/mL         Strongly encourage             Strongly discourage   discontinuation of antibiotics    initiation of antibiotics    ----------------------------     -----------------------------       PCT 0.25 - 0.50 ng/mL            PCT 0.10 - 0.25 ng/mL               OR       >80% decrease in PCT            Discourage initiation of                                             antibiotics      Encourage discontinuation           of antibiotics    ----------------------------     -----------------------------         PCT >= 0.50 ng/mL              PCT 0.26 - 0.50 ng/mL                AND       <80% decrease in PCT             Encourage initiation of  antibiotics       Encourage continuation           of antibiotics    ----------------------------     -----------------------------        PCT >= 0.50 ng/mL                  PCT > 0.50 ng/mL               AND         increase in PCT                  Strongly encourage                                      initiation of antibiotics    Strongly encourage escalation           of antibiotics                                     -----------------------------                                           PCT <= 0.25 ng/mL                                                 OR                                        > 80% decrease in PCT                                     Discontinue / Do not initiate                                             antibiotics Performed at [facility], [address], [place], [place] [address]   Ferritin     Status: Abnormal   Collection Time: 07/27/19  3:29 AM  Result Value Ref Range   Ferritin 2,481 (H) 24 - 336 ng/mL    Comment: Performed at [facility], [address] [place], [place] [address]  Fibrinogen     Status: None   Collection Time: 07/27/19  3:29 AM  Result Value Ref Range   Fibrinogen 375 210 - 475 mg/dL    Comment: Performed at [facility], [address] [place], [place] [address]  D-dimer, quantitative (not at [facility])     Status: Abnormal   Collection Time: 07/27/19  3:29 AM  Result Value Ref Range   D-Dimer, Quant 0.85 (H) 0.00 - 0.50 ug/mL-FEU    Comment: (NOTE) At the manufacturer cut-off of 0.50 ug/mL FEU, this assay has been documented to exclude PE with a sensitivity and  negative predictive value of 97 to 99%.  At this time, this assay has not been approved by the FDA to exclude DVT/VTE. Results should be correlated with clinical presentation. Performed at [facility], [place] [address], [place], [place] [address]    DG Chest Port 1 View  Result Date: 07/26/2019 CLINICAL DATA:  Leg swelling for 2 weeks with shortness of breath EXAM: PORTABLE CHEST 1 VIEW COMPARISON:  12/01/2007 FINDINGS: Cardiac shadow is mildly prominent but accentuated by the portable technique. Elevation the right hemidiaphragm is seen. Mild vascular congestion is noted without focal infiltrate or parenchymal edema. No acute bony abnormality is seen. IMPRESSION: Mild vascular congestion. Electronically Signed   By: Inez Catalina M.D.   On: 07/26/2019 21:25   ECHOCARDIOGRAM COMPLETE  Result Date: 07/27/2019   ECHOCARDIOGRAM REPORT   Patient Name:   Ralph Peterson Date of Exam: 07/27/2019 Medical Rec #:  [id number]            Height:       72.0 in Accession #:    [id number]           Weight:       246.9 lb Date of Birth:  1966/05/20            BSA:          2.33 m Patient Age:    48 years             BP:           109/67 mmHg Patient Gender: M                    HR:           76 bpm. Exam Location:  [facility] Procedure: 2D Echo, Cardiac Doppler and Color Doppler Indications:     Dyspnea 786.09  History:         Patient has no prior history of Echocardiogram examinations.                  CAD; Risk Factors:Hypertension and Diabetes.  Sonographer:     Sherrie Sport RDCS (AE) Referring Phys:  [id number] Nicolette Bang Diagnosing Phys: Yolonda Kida MD IMPRESSIONS  1. Left ventricular ejection fraction, by visual estimation, is 25 to 30%. The left ventricle has moderate to severely decreased function. Left ventricular septal wall thickness was normal. Normal left ventricular posterior wall thickness. There is no left ventricular hypertrophy.  2. Left ventricular diastolic parameters are consistent  with Grade I diastolic dysfunction (impaired relaxation).  3. 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  4. Moderately dilated left ventricular internal cavity size.  5. The left ventricle demonstrates regional wall motion abnormalities.  6. Global right ventricle has low normal systolic function.The right ventricular size is normal. Mildly increased right ventricular wall thickness.  7. Left atrial size was normal.  8. Right atrial size was mildly dilated.  9. The mitral valve is normal in structure. Trivial mitral valve regurgitation. 10. The tricuspid valve is normal in structure. 11. The aortic valve is grossly normal. Aortic valve regurgitation is not visualized. Mild aortic valve sclerosis without stenosis. 12. The pulmonic valve was grossly normal. Pulmonic valve regurgitation is not visualized. 13. Moderately elevated pulmonary artery systolic pressure. FINDINGS  Left Ventricle: Left ventricular ejection fraction, by visual estimation, is 25 to 30%. The left ventricle has moderate to severely decreased function. The left ventricle demonstrates regional wall motion abnormalities. The left ventricular internal cavity size was moderately dilated left ventricle. Normal left  ventricular posterior wall thickness. There is no left ventricular hypertrophy. Left ventricular diastolic parameters are consistent with Grade I diastolic dysfunction (impaired relaxation). There is evidence of a moderate to large inferior and posterior myocardial infarction. Right Ventricle: The right ventricular size is normal. Mildly increased right ventricular wall thickness. Global RV systolic function is has low normal systolic function. The tricuspid regurgitant velocity is 3.19 m/s, and with an assumed right atrial pressure of 10 mmHg, the estimated right ventricular systolic pressure is moderately elevated at 50.7 mmHg. Left Atrium: Left atrial size was normal in size. Right Atrium: Right atrial size was mildly  dilated Pericardium: There is no evidence of pericardial effusion. Mitral Valve: The mitral valve is normal in structure. Trivial mitral valve regurgitation. Tricuspid Valve: The tricuspid valve is normal in structure. Tricuspid valve regurgitation is mild. Aortic Valve: The aortic valve is grossly normal. Aortic valve regurgitation is not visualized. Mild aortic valve sclerosis is present, with no evidence of aortic valve stenosis. Pulmonic Valve: The pulmonic valve was grossly normal. Pulmonic valve regurgitation is not visualized. Pulmonic regurgitation is not visualized. Aorta: The aortic root is normal in size and structure. IAS/Shunts: No atrial level shunt detected by color flow Doppler.  LEFT VENTRICLE PLAX 2D LVIDd:         6.15 cm LVIDs:         5.37 cm LV PW:         1.19 cm LV IVS:        1.53 cm LVOT diam:     2.10 cm LV SV:         51 ml LV SV Index:   21.05 LVOT Area:     3.46 cm  LEFT ATRIUM         Index LA diam:    4.60 cm 1.97 cm/m                        PULMONIC VALVE AORTA                 PV Vmax:        0.52 m/s Ao Root diam: 3.00 cm PV Peak grad:   1.1 mmHg                       RVOT Peak grad: 2 mmHg  TRICUSPID VALVE TR Peak grad:   40.7 mmHg TR Vmax:        319.00 cm/s  SHUNTS Systemic Diam: 2.10 cm  Yolonda Kida MD Electronically signed by Yolonda Kida MD Signature Date/Time: 07/27/2019/11:51:06 AM    Final     Pending Labs Unresulted Labs (From admission, onward)    Start     Ordered   07/28/19 0500  CBC with Differential/Platelet  Daily,   STAT     07/27/19 0249   07/28/19 0500  Comprehensive metabolic panel  Daily,   STAT     07/27/19 0249   07/28/19 0500  C-reactive protein  Daily,   STAT     07/27/19 0249   07/28/19 0500  Fibrin derivatives D-Dimer ([place] only)  Daily,   STAT     07/27/19 0249   07/28/19 0500  Ferritin  Daily,   STAT     07/27/19 0249   07/28/19 0500  Magnesium  Daily,   STAT     07/27/19 0249   07/27/19 1000  Hemoglobin A1c  Once,   STAT     Comments:  To assess prior glycemic control    07/27/19 0959   07/27/19 0250  HIV Antibody (routine testing w rflx)  (HIV Antibody (Routine testing w reflex) panel)  Once,   STAT     07/27/19 0249   07/26/19 2153  Urinalysis, Complete w Microscopic  ONCE - STAT,   STAT     07/26/19 2154          Vitals/Pain Today's Vitals   07/27/19 0600 07/27/19 0725 07/27/19 0945 07/27/19 1130  BP: 109/67   (!) 112/95  Pulse: 76   72  Resp: 12   16  Temp:    98.5 F (36.9 C)  TempSrc:    Oral  SpO2: 100%   100%  Weight:      Height:      PainSc:  Asleep Asleep     Isolation Precautions Airborne and Contact precautions  Medications Medications  aspirin EC tablet 81 mg (has no administration in time range)  amLODipine (NORVASC) tablet 5 mg (has no administration in time range)  atorvastatin (LIPITOR) tablet 40 mg (has no administration in time range)  carvedilol (COREG) tablet 25 mg (has no administration in time range)  lisinopril (ZESTRIL) tablet 2.5 mg (has no administration in time range)  escitalopram (LEXAPRO) tablet 10 mg (has no administration in time range)  clopidogrel (PLAVIX) tablet 75 mg (has no administration in time range)  enoxaparin (LOVENOX) injection 30 mg (30 mg Subcutaneous Given 07/27/19 0418)  Ipratropium-Albuterol (COMBIVENT) respimat 1 puff ( Inhalation Canceled Entry 07/27/19 0322)  dexamethasone (DECADRON) injection 6 mg (6 mg Intravenous Given 07/27/19 0402)  guaiFENesin-dextromethorphan (ROBITUSSIN DM) 100-10 MG/5ML syrup 10 mL (10 mLs Oral Given 07/27/19 0417)  chlorpheniramine-HYDROcodone (TUSSIONEX) 10-8 MG/5ML suspension 5 mL (has no administration in time range)  acetaminophen (TYLENOL) tablet 650 mg (has no administration in time range)  remdesivir 200 mg in sodium chloride 0.9% 250 mL IVPB (0 mg Intravenous Stopped 07/27/19 0208)    Followed by  remdesivir 100 mg in sodium chloride 0.9 % 100 mL IVPB (has no administration in time range)  sevelamer carbonate  (RENVELA) tablet 3,200 mg (has no administration in time range)  sevelamer carbonate (RENVELA) tablet 1,600 mg (has no administration in time range)  insulin aspart (novoLOG) injection 0-5 Units (has no administration in time range)  insulin aspart (novoLOG) injection 0-9 Units (has no administration in time range)  potassium chloride SA (KLOR-CON) CR tablet 40 mEq (40 mEq Oral Given 07/27/19 0401)    Mobility walks with person assist Low fall risk   Focused Assessments    R Recommendations: See Admitting Provider Note  Report given to:

## 2019-07-27 NOTE — H&P (Signed)
History and Physical        Hospital Admission Note Date: 07/27/2019  Patient name: Ralph Peterson Medical record number: [id number] Date of birth: [birth year] Age: 54 y.o. Gender: male  PCP: Vidal Schwalbe, MD    Patient coming from: Home   I have reviewed all records in the [place].    Chief Complaint:  SOB and LE edema   HPI: DRAYTON Peterson is a 54 y.o. male with PMH of ESRD on HD, HTN, T2DM, CAD who preesnts for worsening SOB and LE edema. Reports cough and SOB started about one month ago. He was treated for acute sinusitis at that time with antibiotics and steroids with some improvement. However, he has worsened again presenting with shortness of breath, some nasal congestion and sore throat, productive cough, bilateral lower extremity edema.  Patient is ESRD on HD. Receives dialysis MWF. Has not missed any sessions recently and was able to complete full session this AM. Nephrologist recommend he be seen in ED for evaluation tonight. Reports his dry weight is now 114 kg. They have been taking more off at HD over the past couple weeks. Has never been seen by cardiology, had an appointment to establish care scheduled for tomorrow. No prior history of CHF. Tested negative for COVID 2x this month.    ED work-up/course:  Patient's diagnosis is consistent with stage renal disease, shortness of breath, congestive heart failure, elevated lactic acid, COVID-19.  Patient presented to emergency department complaining of shortness of breath, peripheral edema, productive cough.  Patient states that he had had a viral-like illness a month ago, had tested negative multiple times for COVID-19.  Patient is in end-stage renal disease on dialysis.  Patient states that as his symptoms have progressed to include worsening shortness of breath, peripheral edema he was sent to emergency department for evaluation.  Patient had a  fever on arrival, complains of ongoing shortness of breath and peripheral edema.  Patient had an elevated troponin, elevated BNP as well as an elevated lactic acid.  Unfortunately, patient's COVID-19 test here in the emergency department was positive.  At this time, patient would be a good candidate for admission for new onset CHF work-up, as well as evaluation for fever and elevated lactic acid as well as COVID-19 infection.  Given respiratory complaints, patient will be treated with IV Vancomycin to cover for pneumonia, however concern exists for bacteremia as well..  Review of Systems: Positives marked in 'bold' Constitutional: Denies fever, chills, diaphoresis, poor appetite and fatigue.  HEENT: Denies photophobia, eye pain, redness, hearing loss, ear pain, congestion, sore throat, rhinorrhea, sneezing, mouth sores, trouble swallowing, neck pain, neck stiffness and tinnitus.   Respiratory: Denies SOB, DOE, cough, chest tightness,  and wheezing.   Cardiovascular: Denies chest pain, palpitations and leg swelling.  Gastrointestinal: Denies nausea, vomiting, abdominal pain, diarrhea, constipation, blood in stool and abdominal distention.  Genitourinary: Denies dysuria, urgency, frequency, hematuria, flank pain and difficulty urinating.  Musculoskeletal: Denies myalgias, back pain, joint swelling, arthralgias and gait problem.  Skin: Denies pallor, rash and wound.  Neurological: Denies dizziness, seizures, syncope, weakness, light-headedness, numbness and headaches.  Hematological: Denies adenopathy. Easy bruising, personal  or family bleeding history  Psychiatric/Behavioral: Denies suicidal ideation, mood changes, confusion, nervousness, sleep disturbance and agitation  Past Medical History: Past Medical History:  Diagnosis Date  . Chronic kidney disease   . Coronary artery disease   . Diabetes mellitus without complication ([place])   . Hyperlipidemia   . Hypertension     Past Surgical History:    Procedure Laterality Date  . ABDOMINAL AORTOGRAM N/A 09/10/2017   Procedure: ABDOMINAL AORTOGRAM;  Surgeon: Wellington Hampshire, MD;  Location: [place] [facility];  Service: Cardiovascular;  Laterality: N/A;  . AMPUTATION TOE Bilateral    one toe on right, all five toes on the left  . CARDIAC CATHETERIZATION  2012   [facility]; X1 stent 2.5 x 33 mm xience stent Distal TCA  . LOWER EXTREMITY ANGIOGRAPHY Bilateral 09/10/2017   Procedure: Lower Extremity Angiography;  Surgeon: Wellington Hampshire, MD;  Location: [place] [facility];  Service: Cardiovascular;  Laterality: Bilateral;  . LOWER EXTREMITY ANGIOGRAPHY Left 06/23/2018   Procedure: LOWER EXTREMITY ANGIOGRAPHY;  Surgeon: Katha Cabal, MD;  Location: [place] [facility];  Service: Cardiovascular;  Laterality: Left;  . PERIPHERAL VASCULAR ATHERECTOMY Left 09/10/2017   Procedure: PERIPHERAL VASCULAR ATHERECTOMY;  Surgeon: Wellington Hampshire, MD;  Location: [place] [facility];  Service: Cardiovascular;  Laterality: Left;  SFA/POPLITEAL  . PERIPHERAL VASCULAR BALLOON ANGIOPLASTY Left 09/10/2017   Procedure: PERIPHERAL VASCULAR BALLOON ANGIOPLASTY;  Surgeon: Wellington Hampshire, MD;  Location: [place] [facility];  Service: Cardiovascular;  Laterality: Left;  ANTERIAL TIBIAL  . PERIPHERAL VASCULAR CATHETERIZATION Right 03/26/2016   Procedure: Lower Extremity Angiography;  Surgeon: Katha Cabal, MD;  Location: [place] [facility];  Service: Cardiovascular;  Laterality: Right;  . PERIPHERAL VASCULAR INTERVENTION Left 09/10/2017   Procedure: PERIPHERAL VASCULAR INTERVENTION;  Surgeon: Wellington Hampshire, MD;  Location: [place] [facility];  Service: Cardiovascular;  Laterality: Left;  SFA/POPLITEAL    Medications: Prior to Admission medications   Medication Sig Start Date End Date Taking? Authorizing Provider  acetaminophen (TYLENOL) 500 MG tablet Take 1,000 mg by mouth 2 (two) times daily as needed for moderate pain.   Yes [provider]  aspirin EC 81 MG tablet Take 81 mg by mouth every evening.   Yes [provider]  atorvastatin (LIPITOR) 40 MG tablet Take 40 mg by mouth every evening.  03/31/18  Yes [provider]  carvedilol (COREG) 25 MG tablet Take 25 mg by mouth 2 (two) times daily with a meal.  06/18/13  Yes [provider]  clopidogrel (PLAVIX) 75 MG tablet TAKE 1 TABLET BY MOUTH ONCE DAILY. 09/16/18  Yes Minna Merritts, MD  diphenhydrAMINE (BENADRYL) 25 MG tablet Take 25 mg by mouth at bedtime as needed for allergies.   Yes [provider]  escitalopram (LEXAPRO) 10 MG tablet Take 10 mg by mouth daily.    Yes [provider]  fluticasone (FLONASE) 50 MCG/ACT nasal spray Place 2 sprays into both nostrils every evening.    Yes [provider]  glipiZIDE (GLUCOTROL) 5 MG tablet Take 5 mg by mouth daily before breakfast.  01/01/19  Yes [provider]  sevelamer carbonate (RENVELA) 800 MG tablet Take 1,600-3,200 mg by mouth See admin instructions. Take 3200 mg with meals / 1600 mg with snacks   Yes [provider]  amLODipine (NORVASC) 5 MG tablet Take 5 mg by mouth every evening.  03/31/18   [provider]  cetirizine (ZYRTEC) 10  MG tablet Take 10 mg by mouth every evening.  05/21/18   [provider]  lisinopril (PRINIVIL,ZESTRIL) 2.5 MG tablet Take 2.5 mg by mouth every evening.     [provider]    Allergies:   Allergies  Allergen Reactions  . Penicillins Rash    Has patient had a PCN reaction causing immediate rash, facial/tongue/throat swelling, SOB or lightheadedness with hypotension: Yes Has patient had a PCN reaction causing severe rash involving mucus membranes or skin necrosis: Yes Has patient had a PCN reaction that required hospitalization: No Has patient had a PCN reaction occurring within the last 10 years: No If all of the above answers are "NO", then may proceed with Cephalosporin use.      Social History:  reports that he has never smoked. He has never used smokeless tobacco. He reports that he does not drink alcohol or use drugs.  Family History: Family History  Problem Relation Age of Onset  . Hypertension Mother     Physical Exam: Blood pressure 120/69, pulse 81, temperature (!) 100.7 F (38.2 C), temperature source Oral, resp. rate 13, height 6' (1.829 m), weight 112 kg, SpO2 95 %. General: Alert, awake, oriented x3, in no acute distress. Eyes: pink conjunctiva,anicteric sclera, pupils equal and reactive to light and accomodation, HEENT: normocephalic, atraumatic, oropharynx clear Neck: supple, no masses or lymphadenopathy, no goiter, no bruits, no JVD CVS: Regular rate and rhythm, without murmurs, rubs or gallops. Non-pitting 1+ LE edema bilaterally.  Resp : Normal respiratory effort. No tachypnea or retractions. Faint crackles appreciated, otherwise clear. Good air movement.  GI : Soft, nontender, nondistended, positive bowel sounds, no masses. No hepatomegaly. No hernia.  Musculoskeletal: No clubbing or cyanosis, positive pedal pulses. No contracture. ROM intact  Neuro: Grossly intact, no focal neurological deficits, strength 5/5 upper and lower extremities bilaterally Psych: alert and oriented x 3, normal mood and affect Skin: no rashes or lesions, warm and dry   LABS on Admission: I have personally reviewed all the labs and imagings below    Basic Metabolic Panel: Recent Labs  Lab 07/26/19 1959  NA 134*  K 3.1*  CL 92*  CO2 28  GLUCOSE 100*  BUN 14  CREATININE 4.89*  CALCIUM 8.3*   Liver Function Tests: No results for input(s): AST, ALT, ALKPHOS, BILITOT, PROT, ALBUMIN in the last 168 hours. No results for input(s): LIPASE, AMYLASE in the last 168 hours. No results for input(s): AMMONIA in the last 168 hours. CBC: Recent Labs  Lab 07/26/19 1959  WBC 3.4*  HGB 10.8*  HCT 34.8*  MCV 98.3  PLT 153   Cardiac Enzymes: No results for  input(s): CKTOTAL, CKMB, CKMBINDEX, TROPONINI in the last 168 hours. BNP: Invalid input(s): POCBNP CBG: No results for input(s): GLUCAP in the last 168 hours.  Radiological Exams on Admission:  DG Chest Port 1 View  Result Date: 07/26/2019 CLINICAL DATA:  Leg swelling for 2 weeks with shortness of breath EXAM: PORTABLE CHEST 1 VIEW COMPARISON:  12/01/2007 FINDINGS: Cardiac shadow is mildly prominent but accentuated by the portable technique. Elevation the right hemidiaphragm is seen. Mild vascular congestion is noted without focal infiltrate or parenchymal edema. No acute bony abnormality is seen. IMPRESSION: Mild vascular congestion. Electronically Signed   By: Inez Catalina M.D.   On: 07/26/2019 21:25      EKG: Independently reviewed. NSR. Right BBB. No STEMI.    Assessment/Plan Active Problems:   CAD (coronary artery disease)   Diabetes mellitus ([place])  Dialysis patient [facility])   ESRD (end stage renal disease) ([place])   Hyperlipidemia, unspecified   Hypertension   COVID-19   Shortness of breath   Elevated troponin   COVID-19  Patient presenting with SOB/cough/sore throat found to be COVID+. Fever to 100.7. Vital signs otherwise normal. Appropriate O2 saturations on RA and no increased WOB. No leukocytosis present. Lactic acid elevated to 2.1. CXR with mild vascular congestion.  -admit to telemetry, continuous pulse ox  -start Remdesivir  -give Decadron 6 mg IV  -obtain CRP, LDH, Fibrinogen, Ferritin, D-dimer  -follow above labs and daily CBC/CMET  -duonebs q6h  -lower suspicion for bacterial PNA, obtain PCT  -blood cultures pending, patient at higher risk for bacteremia due to dialysis  -repeat EKG  -airborne/contact precautions  -incentive spirometry  -antitussives ordered   Shortness of Breath/Edema  Patient presenting with worsening SOB and new onset LE edema for the past two weeks. BNP 1855. No prior history of CHF. Concern for new onset CHF with this presentation  potentially exacerbated by COVID infection. Patient oliguric so poor candidate for IV diuresis. Low suspicion for DVT. Edema is symmetrical, no other signs of DVT on exam.  -obtain echo  -daily weights  -strict I/Os  -nephrology consulted  -consider cardiology consult in AM   Elevated Troponin  Troponin trend 491>750. May be related to ESRD. Potential component for demand ischemia with COVID and ?new onset CHF. EKG without acute ST segment changes and patient denies typical ACS symptoms.  -trend troponin 2x  -repeat EKG   Hypokalemia  K 3.1.  -replete cautiously given ESRD, Kdur 40 mEQ ordered  -check Mg -repeat K in AM   ESRD on Dialysis  On HD MWF. Has not missed any sessions recently. Dry weight 114kg per patient. Cr 4.89. BUN 14. GFR 13.  -nephrology consulted   HTN  Normotensive.  -continue Norvasc and Lisinopril   T2DM  Glucose 100 at admission.  -hold Glipizide  -check glucose on CMET in AM   CAD  -continue ASA and Lipitor     DVT prophylaxis: Lovenox   CODE STATUS: FULL   Consults called: Nephrology   Family Communication: Admission, patients condition and plan of care including tests being ordered have been discussed with the patien. who indicates understanding and agree with the plan and Code Status  Admission status: Inpatient   The medical decision making on this patient was of high complexity and the patient is at high risk for clinical deterioration, therefore this is a level 3 admission.  Severity of Illness:     Moderate  The appropriate patient status for this patient is INPATIENT. Inpatient status is judged to be reasonable and necessary in order to provide the required intensity of service to ensure the patient's safety. The patient's presenting symptoms, physical exam findings, and initial radiographic and laboratory data in the context of their chronic comorbidities is felt to place them at high risk for further clinical deterioration.  Furthermore, it is not anticipated that the patient will be medically stable for discharge from the hospital within 2 midnights of admission. The following factors support the patient status of inpatient.   " The patient's presenting symptoms include SOB, LE edema. " The worrisome physical exam findings include LE edema, faint crackles at bases, fever. " The initial radiographic and laboratory data are worrisome because of CXR with mild vascular congestion, BNP 1800, Troponin 750, K 3.1. " The chronic co-morbidities include ESRD on HD, T2DM, CAD, HTN.   *  I certify that at the point of admission it is my clinical judgment that the patient will require inpatient hospital care spanning beyond 2 midnights from the point of admission due to high intensity of service, high risk for further deterioration and high frequency of surveillance required.*    Time Spent on Admission: 65 min      Melina Schools D.O.  [facility] 07/27/2019, 12:50 AM

## 2019-07-27 NOTE — Progress Notes (Addendum)
PROGRESS NOTE    Ralph Peterson  [id number] DOB: 1966/01/31 DOA: 07/26/2019 PCP: Vidal Schwalbe, MD      Brief Narrative:  Mr. Cooksley is a 54 y.o. M with ESRD on HD MWF, HTN, DM, and CAD who presented with few days progressive SOB and edema.  Reports cough and SOB started about one month ago. He was treated for acute sinusitis at that time with antibiotics and steroids with some improvement. However, he has worsened again presenting with shortness of breath, some nasal congestion and sore throat, productive cough, bilateral lower extremity edema.  In the ER, COVID+, fever.  CXR showed faint bilateral opacities.  SpO2 79%, RR up to 40.       Assessment & Plan:  COVID-19 pneumonitis with hypoxic respiratory failure Patient presented with SPO2 79%, respiratory rate greater than 30 in the setting of the ongoing COVID-19 pandemic. -Continue remdesivir -Continue dexamethasone  -Consult nephrology for dialysis MWF    Elevated troponin New onset CHF Patient noted to have elevated Echo.  ECG compared to previous, and shows RBBB, possible strain pattern in V1, anterior leads, no ischemic changes relative to Feb 2020 ECG.    Unfortunately, echo today showed EF 25%.  No previous echo report available, patient has history of ischemic heart disease, but denies reduced EF -Consult Cardiology appreciate cares, discussed with Dr. Saunders Revel, patient on list for W[place] and secure chat sent to Dr. Garen Lah   Hypertension Coronary disease, secondary prevention BP low normal -Continue Plavix, Coreg, atorvastatin, aspirin, lisinopril -Continue amlodipine  Diabetes -Hold glipizide -Start sliding scale corrections and Lantus  ESRD -Continue sevelamer -Consult nephrology, appreciate recommendations  Mood disorder -Continue escitalopram           DVT prophylaxis: SCDs Code Status: FULL Family Communication:     MDM and disposition Plan: This is a no charge note.  For  further details, please see H&P by my partner Dr. Juleen China from earlier today.  The below labs and imaging reports were reviewed and summarized above.    The patient was admitted with hypoxia. Found to be COVID positive.  Rapidly weaned form O2, but has new onset reduced EF.    Objective: Vitals:   07/27/19 0400 07/27/19 0500 07/27/19 0530 07/27/19 0600  BP: 114/79 119/76 102/67 109/67  Pulse:  76  76  Resp: (!) 23  17 12   Temp:      TempSrc:      SpO2:  100%  100%  Weight:      Height:       No intake or output data in the 24 hours ending 07/27/19 0953 Filed Weights   07/26/19 1955  Weight: 112 kg    Examination: The patient was seen and examined.      Data Reviewed: I have personally reviewed following labs and imaging studies:  CBC: Recent Labs  Lab 07/26/19 1959  WBC 3.4*  HGB 10.8*  HCT 34.8*  MCV 98.3  PLT 0000000   Basic Metabolic Panel: Recent Labs  Lab 07/26/19 1959  NA 134*  K 3.1*  CL 92*  CO2 28  GLUCOSE 100*  BUN 14  CREATININE 4.89*  CALCIUM 8.3*   GFR: Estimated Creatinine Clearance: 23.1 mL/min (A) (by C-G formula based on SCr of 4.89 mg/dL (H)). Liver Function Tests: No results for input(s): AST, ALT, ALKPHOS, BILITOT, PROT, ALBUMIN in the last 168 hours. No results for input(s): LIPASE, AMYLASE in the last 168 hours. No results for input(s): AMMONIA in the last 168  hours. Coagulation Profile: No results for input(s): INR, PROTIME in the last 168 hours. Cardiac Enzymes: No results for input(s): CKTOTAL, CKMB, CKMBINDEX, TROPONINI in the last 168 hours. BNP (last 3 results) No results for input(s): PROBNP in the last 8760 hours. HbA1C: No results for input(s): HGBA1C in the last 72 hours. CBG: No results for input(s): GLUCAP in the last 168 hours. Lipid Profile: No results for input(s): CHOL, HDL, LDLCALC, TRIG, CHOLHDL, LDLDIRECT in the last 72 hours. Thyroid Function Tests: No results for input(s): TSH, T4TOTAL, FREET4, T3FREE,  THYROIDAB in the last 72 hours. Anemia Panel: Recent Labs    07/27/19 0329  FERRITIN 2,481*   Urine analysis: No results found for: COLORURINE, APPEARANCEUR, LABSPEC, PHURINE, GLUCOSEU, HGBUR, BILIRUBINUR, KETONESUR, PROTEINUR, UROBILINOGEN, NITRITE, LEUKOCYTESUR Sepsis Labs: @LABRCNTIP (procalcitonin:4,lacticacidven:4)  ) Recent Results (from the past 240 hour(s))  Culture, blood (routine x 2)     Status: None (Preliminary result)   Collection Time: 07/26/19 10:40 PM   Specimen: BLOOD  Result Value Ref Range Status   Specimen Description BLOOD BLOOD LEFT WRIST  Final   Special Requests   Final    BOTTLES DRAWN AEROBIC AND ANAEROBIC Blood Culture adequate volume   Culture   Final    NO GROWTH < 12 HOURS Performed at [facility], [address] [place], [place] [address]    Report Status PENDING  Incomplete  Culture, blood (routine x 2)     Status: None (Preliminary result)   Collection Time: 07/26/19 10:40 PM   Specimen: BLOOD  Result Value Ref Range Status   Specimen Description BLOOD BLOOD LEFT HAND  Final   Special Requests   Final    BOTTLES DRAWN AEROBIC AND ANAEROBIC Blood Culture adequate volume   Culture   Final    NO GROWTH < 12 HOURS Performed at [facility], [address] [place], [place] [address]    Report Status PENDING  Incomplete         Radiology Studies: DG Chest Port 1 View  Result Date: 07/26/2019 CLINICAL DATA:  Leg swelling for 2 weeks with shortness of breath EXAM: PORTABLE CHEST 1 VIEW COMPARISON:  12/01/2007 FINDINGS: Cardiac shadow is mildly prominent but accentuated by the portable technique. Elevation the right hemidiaphragm is seen. Mild vascular congestion is noted without focal infiltrate or parenchymal edema. No acute bony abnormality is seen. IMPRESSION: Mild vascular congestion. Electronically Signed   By: Inez Catalina M.D.   On: 07/26/2019 21:25        Scheduled Meds: . amLODipine  5 mg Oral QPM  .  aspirin EC  81 mg Oral QPM  . atorvastatin  40 mg Oral QPM  . carvedilol  25 mg Oral BID WC  . clopidogrel  75 mg Oral Daily  . dexamethasone (DECADRON) injection  6 mg Intravenous Q24H  . enoxaparin (LOVENOX) injection  30 mg Subcutaneous Q24H  . escitalopram  10 mg Oral Daily  . glipiZIDE  5 mg Oral QAC breakfast  . Ipratropium-Albuterol  1 puff Inhalation Q6H  . lisinopril  2.5 mg Oral QPM  . sevelamer carbonate  1,600 mg Oral With snacks  . sevelamer carbonate  3,200 mg Oral TID WC   Continuous Infusions: . [START ON 07/28/2019] remdesivir 100 mg in NS 100 mL       LOS: 0 days    Time spent: 25 minutes    Edwin Dada, MD [facility] 07/27/2019, 9:53 AM     Please page though [place] or  Epic secure chat:  For password, contact charge nurse

## 2019-07-27 NOTE — ED Notes (Signed)
Pt states he does not urinate anymore. Is a dialysis pt. MWF.

## 2019-07-27 NOTE — Progress Notes (Signed)
Remdesivir - Pharmacy Brief Note   O:  ALT: pending CXR: evidence of infection SpO2: 100% on RA   A/P:  Remdesivir 200 mg IVPB once followed by 100 mg IVPB daily x 4 days.   Hart Robinsons, PharmD Clinical Pharmacist   07/27/2019 1:38 AM

## 2019-07-27 NOTE — ED Notes (Signed)
This RN informed MD Danford of pt's increased troponin level. No new orders at this time.

## 2019-07-27 NOTE — Progress Notes (Signed)
*  PRELIMINARY RESULTS* Echocardiogram 2D Echocardiogram has been performed.  Ralph Peterson 07/27/2019, 9:08 AM

## 2019-07-27 NOTE — Progress Notes (Addendum)
PHARMACIST - PHYSICIAN COMMUNICATION  CONCERNING:  Enoxaparin (Lovenox) for DVT Prophylaxis   Patient was prescribed enoxaprin 30mg  q24 hours for VTE prophylaxis.   Patient has PMH of ESRD on HD and enoxaparin use is contraindicated.   Per [facility] policy Enoxaparin is changed to heparin subQ when patient has ESRD or CrCl <14ml/min.    DESCRIPTION: Pharmacy has discontinued order for enoxaparin 30mg  every 24 hours and placed order for heparin 5000 units every 8 hours.   Pernell Dupre, PharmD, BCPS Clinical Pharmacist 07/27/2019 11:59 AM

## 2019-07-27 NOTE — Plan of Care (Signed)

## 2019-07-28 ENCOUNTER — Encounter: Payer: Self-pay | Admitting: Family Medicine

## 2019-07-28 DIAGNOSIS — N186 End stage renal disease: Secondary | ICD-10-CM

## 2019-07-28 DIAGNOSIS — I214 Non-ST elevation (NSTEMI) myocardial infarction: Secondary | ICD-10-CM

## 2019-07-28 DIAGNOSIS — U071 COVID-19: Principal | ICD-10-CM

## 2019-07-28 DIAGNOSIS — E1122 Type 2 diabetes mellitus with diabetic chronic kidney disease: Secondary | ICD-10-CM

## 2019-07-28 DIAGNOSIS — I25118 Atherosclerotic heart disease of native coronary artery with other forms of angina pectoris: Secondary | ICD-10-CM

## 2019-07-28 DIAGNOSIS — Z992 Dependence on renal dialysis: Secondary | ICD-10-CM

## 2019-07-28 DIAGNOSIS — R05 Cough: Secondary | ICD-10-CM

## 2019-07-28 DIAGNOSIS — I255 Ischemic cardiomyopathy: Secondary | ICD-10-CM

## 2019-07-28 LAB — HEPARIN LEVEL (UNFRACTIONATED)
Heparin Unfractionated: <0.1 IU/mL — ABNORMAL LOW (ref 0.30–0.70)
Heparin Unfractionated: 0.77 IU/mL — ABNORMAL HIGH (ref 0.30–0.70)

## 2019-07-28 LAB — COMPREHENSIVE METABOLIC PANEL
ALT: 16 U/L (ref 0–44)
AST: 17 U/L (ref 15–41)
Albumin: 3.1 g/dL — ABNORMAL LOW (ref 3.5–5.0)
Alkaline Phosphatase: 70 U/L (ref 38–126)
Anion gap: 15 (ref 5–15)
BUN: 44 mg/dL — ABNORMAL HIGH (ref 6–20)
CO2: 29 mmol/L (ref 22–32)
Calcium: 8.2 mg/dL — ABNORMAL LOW (ref 8.9–10.3)
Chloride: 86 mmol/L — ABNORMAL LOW (ref 98–111)
Creatinine, Ser: 7.89 mg/dL — ABNORMAL HIGH (ref 0.61–1.24)
GFR calc Af Amer: 8 mL/min — ABNORMAL LOW (ref >60)
GFR calc non Af Amer: 7 mL/min — ABNORMAL LOW (ref >60)
Glucose, Bld: 238 mg/dL — ABNORMAL HIGH (ref 70–99)
Potassium: 4.5 mmol/L (ref 3.5–5.1)
Sodium: 130 mmol/L — ABNORMAL LOW (ref 135–145)
Total Bilirubin: 0.6 mg/dL (ref 0.3–1.2)
Total Protein: 6.1 g/dL — ABNORMAL LOW (ref 6.5–8.1)

## 2019-07-28 LAB — CBC WITH DIFFERENTIAL/PLATELET
Abs Immature Granulocytes: 0.07 10*3/uL (ref 0.00–0.07)
Basophils Absolute: 0 10*3/uL (ref 0.0–0.1)
Basophils Relative: 0 %
Eosinophils Absolute: 0 10*3/uL (ref 0.0–0.5)
Eosinophils Relative: 0 %
HCT: 33.2 % — ABNORMAL LOW (ref 39.0–52.0)
Hemoglobin: 10.7 g/dL — ABNORMAL LOW (ref 13.0–17.0)
Immature Granulocytes: 2 %
Lymphocytes Relative: 22 %
Lymphs Abs: 0.8 10*3/uL (ref 0.7–4.0)
MCH: 31.3 pg (ref 26.0–34.0)
MCHC: 32.2 g/dL (ref 30.0–36.0)
MCV: 97.1 fL (ref 80.0–100.0)
Monocytes Absolute: 0.5 10*3/uL (ref 0.1–1.0)
Monocytes Relative: 13 %
Neutro Abs: 2.3 10*3/uL (ref 1.7–7.7)
Neutrophils Relative %: 63 %
Platelets: 159 10*3/uL (ref 150–400)
RBC: 3.42 MIL/uL — ABNORMAL LOW (ref 4.22–5.81)
RDW: 15.7 % — ABNORMAL HIGH (ref 11.5–15.5)
WBC: 3.6 10*3/uL — ABNORMAL LOW (ref 4.0–10.5)
nRBC: 0.8 % — ABNORMAL HIGH (ref 0.0–0.2)

## 2019-07-28 LAB — PROTIME-INR
INR: 1.2 (ref 0.8–1.2)
Prothrombin Time: 14.7 seconds (ref 11.4–15.2)

## 2019-07-28 LAB — PHOSPHORUS: Phosphorus: 2.7 mg/dL (ref 2.5–4.6)

## 2019-07-28 LAB — GLUCOSE, CAPILLARY
Glucose-Capillary: 295 mg/dL — ABNORMAL HIGH (ref 70–99)
Glucose-Capillary: 274 mg/dL — ABNORMAL HIGH (ref 70–99)
Glucose-Capillary: 307 mg/dL — ABNORMAL HIGH (ref 70–99)
Glucose-Capillary: 278 mg/dL — ABNORMAL HIGH (ref 70–99)

## 2019-07-28 LAB — HEPATITIS B SURFACE ANTIGEN: Hepatitis B Surface Ag: NONREACTIVE

## 2019-07-28 LAB — TROPONIN I (HIGH SENSITIVITY)
Troponin I (High Sensitivity): 1574 ng/L (ref <18)
Troponin I (High Sensitivity): 1489 ng/L (ref <18)

## 2019-07-28 LAB — C-REACTIVE PROTEIN: CRP: 2.1 mg/dL — ABNORMAL HIGH (ref <1.0)

## 2019-07-28 LAB — APTT: aPTT: 33 seconds (ref 24–36)

## 2019-07-28 LAB — HEPATITIS B SURFACE ANTIBODY,QUALITATIVE: Hep B S Ab: NONREACTIVE

## 2019-07-28 LAB — MAGNESIUM: Magnesium: 2.3 mg/dL (ref 1.7–2.4)

## 2019-07-28 LAB — FERRITIN: Ferritin: 2065 ng/mL — ABNORMAL HIGH (ref 24–336)

## 2019-07-28 LAB — D-DIMER, QUANTITATIVE: D-Dimer, Quant: 0.52 ug/mL-FEU — ABNORMAL HIGH (ref 0.00–0.50)

## 2019-07-28 MED ORDER — ENSURE MAX PROTEIN PO LIQD
11.0000 [oz_av] | Freq: Every day | ORAL | Status: DC
  Administered 2019-07-29: 11 [oz_av] via ORAL
  Filled 2019-07-28: qty 330

## 2019-07-28 MED ORDER — HEPARIN (PORCINE) 25000 UT/250ML-% IV SOLN
950.0000 [IU]/h | INTRAVENOUS | Status: DC
  Administered 2019-07-28: 13:00:00 1200 [IU]/h via INTRAVENOUS
  Administered 2019-07-29: 1100 [IU]/h via INTRAVENOUS
  Filled 2019-07-28 (×2): qty 250

## 2019-07-28 MED ORDER — SODIUM CHLORIDE 0.9 % IV SOLN
INTRAVENOUS | Status: DC | PRN
  Administered 2019-07-28: 25 mL via INTRAVENOUS

## 2019-07-28 MED ORDER — HEPARIN BOLUS VIA INFUSION
4000.0000 [IU] | Freq: Once | INTRAVENOUS | Status: AC
  Administered 2019-07-28: 4000 [IU] via INTRAVENOUS
  Filled 2019-07-28: qty 4000

## 2019-07-28 NOTE — Consult Note (Signed)
ANTICOAGULATION CONSULT NOTE - Follow Up Consult  Pharmacy Consult for Heparin Infusion  Indication:ACS/STEMI  Allergies  Allergen Reactions  . Penicillins Rash    Has patient had a PCN reaction causing immediate rash, facial/tongue/throat swelling, SOB or lightheadedness with hypotension: Yes Has patient had a PCN reaction causing severe rash involving mucus membranes or skin necrosis: Yes Has patient had a PCN reaction that required hospitalization: No Has patient had a PCN reaction occurring within the last 10 years: No If all of the above answers are "NO", then may proceed with Cephalosporin use.     Patient Measurements: Height: 6' (182.9 cm) Weight: 240 lb 15.4 oz (109.3 kg) IBW/kg (Calculated) : 77.6   Vital Signs: Temp: 97.5 F (36.4 C) (01/06 0811) Temp Source: Oral (01/06 0557) BP: 107/76 (01/06 0811) Pulse Rate: 80 (01/06 0811)  Labs: Recent Labs    07/26/19 1959 07/26/19 2000 07/26/19 2240 07/27/19 0329 07/28/19 0230  HGB 10.8*  --   --   --  10.7*  HCT 34.8*  --   --   --  33.2*  PLT 153  --   --   --  159  CREATININE 4.89*  --   --   --  7.89*  TROPONINIHS  --  491* 750* 1,143*  --     Estimated Creatinine Clearance: 14.1 mL/min (A) (by C-G formula based on SCr of 7.89 mg/dL (H)).  Assessment: Pharmacy consulted for heparin infusion dosing and monitoring for 54 yo male for ACS. Patient has a PMH of ESRD and is COVID (+). Patient received enoxaparin 30mg  x 1 dose 1/5 @ 0418.   1/5 Troponin HS: 1143 1/6 Troponin HS: pending   Goal of Therapy:  Heparin level 0.3-0.7 units/ml Monitor platelets by anticoagulation protocol: Yes   Plan:  Baseline labs have been ordered  Give 4000 units bolus x 1 Start heparin infusion at 1200 units/hr Check anti-Xa level in 8 hours and daily while on heparin Continue to monitor H&H and platelets  Pernell Dupre, PharmD, BCPS Clinical Pharmacist 07/28/2019 8:53 AM

## 2019-07-28 NOTE — TOC Initial Note (Signed)
Transition of Care [facility]) - Initial/Assessment Note    Patient Details  Name: Ralph Peterson MRN: [id number] Date of Birth: [birth year]  Transition of Care [facility]) CM/SW Contact:    Shelbie Hutching, RN Phone Number: 07/28/2019, 11:19 AM  Clinical Narrative:                 Patient admitted to the hospital with COVID 19.  History of end stage renal disease on hemodialysis MWF at [facility] in [place].  Patient drives himself to dialysis and is independent in ADL's.   RNCM will follow for discharge needs.   Expected Discharge Plan: Home/Self Care Barriers to Discharge: Continued Medical Work up   Patient Goals and CMS Choice Patient states their goals for this hospitalization and ongoing recovery are:: to feel better      Expected Discharge Plan and Services Expected Discharge Plan: Home/Self Care   Discharge Planning Services: CM Consult   Living arrangements for the past 2 months: Single Family Home                                      Prior Living Arrangements/Services Living arrangements for the past 2 months: Single Family Home Lives with:: Adult Children Patient language and need for interpreter reviewed:: Yes Do you feel safe going back to the place where you live?: Yes      Need for Family Participation in Patient Care: No (Comment)     Criminal Activity/Legal Involvement Pertinent to Current Situation/Hospitalization: No - Comment as needed  Activities of Daily Living Home Assistive Devices/Equipment: CBG Meter, Blood pressure cuff ADL Screening (condition at time of admission) Patient's cognitive ability adequate to safely complete daily activities?: Yes Is the patient deaf or have difficulty hearing?: No Does the patient have difficulty seeing, even when wearing glasses/contacts?: No Does the patient have difficulty concentrating, remembering, or making decisions?: No Patient able to express need for assistance with ADLs?: Yes Does the  patient have difficulty dressing or bathing?: No Independently performs ADLs?: Yes (appropriate for developmental age) Does the patient have difficulty walking or climbing stairs?: No Weakness of Legs: None Weakness of Arms/Hands: None  Permission Sought/Granted Permission sought to share information with : Case Manager Permission granted to share information with : Yes, Verbal Permission Granted              Emotional Assessment   Attitude/Demeanor/Rapport: Engaged Affect (typically observed): Accepting Orientation: : Oriented to Self, Oriented to Place, Oriented to  Time, Oriented to Situation Alcohol / Substance Use: Not Applicable Psych Involvement: No (comment)  Admission diagnosis:  Shortness of breath [R06.02] SOB (shortness of breath) [R06.02] ESRD (end stage renal disease) on dialysis (HCC) [N18.6, Z99.2] Elevated lactic acid level [R79.89] Acute congestive heart failure, unspecified heart failure type ([place]) [I50.9] COVID-19 [U07.1] Patient Active Problem List   Diagnosis Date Noted  . COVID-19 07/27/2019  . Shortness of breath 07/27/2019  . Elevated troponin 07/27/2019  . Acute congestive heart failure ([place])   . Elevated lactic acid level   . Complication of vascular access for dialysis 07/30/2018  . Atherosclerosis of native arteries of extremity with intermittent claudication ([place]) 06/16/2018  . Dialysis patient ([place]) 09/10/2016  . Toe gangrene ([place]) 03/20/2016  . CAD (coronary artery disease) 11/07/2011  . Diabetes mellitus ([place]) 11/07/2011  . ESRD (end stage renal disease) ([place]) 11/07/2011  . Hyperlipidemia, unspecified 11/07/2011  . Hypertension 11/07/2011  .  Stented coronary artery 11/07/2011   PCP:  Vidal Schwalbe, MD Pharmacy:   [place], [place] - [address] [place] [address] Phone: [phone] Fax: [phone]     Social Determinants of Health (SDOH) Interventions    Readmission  Risk Interventions No flowsheet data found.

## 2019-07-28 NOTE — Progress Notes (Signed)
Post HD Assessment    07/28/19 2211  Neurological  Level of Consciousness Alert  Orientation Level Oriented X4  Respiratory  Respiratory Pattern Regular;Unlabored  Chest Assessment Chest expansion symmetrical  Bilateral Breath Sounds Clear;Diminished  Cough None  Cardiac  Pulse Regular  Heart Sounds S1, S2  ECG Monitor Yes  Cardiac Rhythm NSR  Vascular  R Radial Pulse +2  L Radial Pulse +2  Edema Generalized  Psychosocial  Psychosocial (WDL) WDL

## 2019-07-28 NOTE — Progress Notes (Signed)
Inpatient Diabetes Program Recommendations  AACE/ADA: New Consensus Statement on Inpatient Glycemic Control   Target Ranges:  Prepandial:   less than 140 mg/dL      Peak postprandial:   less than 180 mg/dL (1-2 hours)      Critically ill patients:  140 - 180 mg/dL  Results for ASTYN, BALDERSON (MRN [id number]) as of 07/28/2019 07:49  Ref. Range 07/28/2019 02:30  Glucose Latest Ref Range: 70 - 99 mg/dL 238 (H)   Results for FOREST, CARNAHAN (MRN [id number]) as of 07/28/2019 07:49  Ref. Range 07/27/2019 12:03 07/27/2019 13:12 07/27/2019 16:39  Glucose-Capillary Latest Ref Range: 70 - 99 mg/dL 121 (H) 141 (H) 374 (H)   Review of Glycemic Control  Diabetes history: DM2 Outpatient Diabetes medications: Glipizide 5 mg QAM Current orders for Inpatient glycemic control: Lantus 10 units QHS, Novolog 0-15 units TID with meals, Novolog 0-5 units QHS; Decadron 6 mg Q24H  Inpatient Diabetes Program Recommendations:   Insulin-Meal Coverage: If steroids are continued, please consider ordering Novolog 5 units TID with meals for meal coverage if patient eats at least 50% of meals.  Thanks, Barnie Alderman, RN, MSN, CDE Diabetes Coordinator Inpatient Diabetes Program [phone] (Team Pager from 8am to 5pm)

## 2019-07-28 NOTE — Consult Note (Signed)
Cardiology Consult    Patient ID: Ralph Peterson MRN: [id number], DOB/AGE: 02-11-1966   Admit date: 07/26/2019 Date of Consult: 07/28/2019  Primary Physician: Vidal Schwalbe, MD Primary Cardiologist: Kathlyn Sacramento, MD Requesting Provider: C. Danford, MD Reason for consult: Non-STEMI, cardiomyopathy  Patient Profile    Ralph Peterson is a 54 y.o. male with a history of CAD s/p prior RCA DES, ESRD on HD, PAD s/p bilat LE interventions, HTN, HL, DMII, who is being seen today for the evaluation of progressive dyspnea w/ new finding of LV dysfxn (EF 25-30%) & NSTEMI at the request of Dr. Loleta Books.  Past Medical History   Past Medical History:  Diagnosis Date  . CAD (coronary artery disease)    a. 09/2010 Cath/PCI ([facility]): LM nl, LAD 35m, D1 80 (small), LCX 23m, OM1 30, RI 70 (small), RCA 70 (DES).  . Coronary artery disease   . COVID-19 virus infection 07/2019  . Diabetes mellitus without complication ([place])   . ESRD (end stage renal disease) ([place])   . Hyperlipidemia   . Hypertension   . Ischemic cardiomyopathy    a.  12/2011 Echo ([facility]): NL EF, mod LVH. Mild AS/MS, triv PR/TR. 07/2019 Echo: EF 25-30%, GR1 DD, inf/post HK, low nl RV fxn, mildly dil RA, triv MR, mild Ao sclerosis w/o stenosis.  . NSTEMI (non-ST elevated myocardial infarction) ([place]) 07/2019  . PAD (peripheral artery disease) ([place])    a. 03/2016 s/p PTA/DEB R SFA/popliteal/peroneal; b. 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 08/2017 Atherectomy/DEB dist L SFA/popliteal. PTA of L AT; d. 06/2018 PTA/DEB L SFA/popliteal/PT/AT; e. 01/2019 Stable ABIs.    Past Surgical History:  Procedure Laterality Date  . ABDOMINAL AORTOGRAM N/A 09/10/2017   Procedure: ABDOMINAL AORTOGRAM;  Surgeon: Wellington Hampshire, MD;  Location: [place] [facility];  Service: Cardiovascular;  Laterality: N/A;  . AMPUTATION TOE Bilateral    one toe on right, all five toes on the left  . CARDIAC CATHETERIZATION  2012   [facility]; X1 stent  2.5 x 33 mm xience stent Distal TCA  . LOWER EXTREMITY ANGIOGRAPHY Bilateral 09/10/2017   Procedure: Lower Extremity Angiography;  Surgeon: Wellington Hampshire, MD;  Location: [place] [facility];  Service: Cardiovascular;  Laterality: Bilateral;  . LOWER EXTREMITY ANGIOGRAPHY Left 06/23/2018   Procedure: LOWER EXTREMITY ANGIOGRAPHY;  Surgeon: Katha Cabal, MD;  Location: [place] [facility];  Service: Cardiovascular;  Laterality: Left;  . PERIPHERAL VASCULAR ATHERECTOMY Left 09/10/2017   Procedure: PERIPHERAL VASCULAR ATHERECTOMY;  Surgeon: Wellington Hampshire, MD;  Location: [place] [facility];  Service: Cardiovascular;  Laterality: Left;  SFA/POPLITEAL  . PERIPHERAL VASCULAR BALLOON ANGIOPLASTY Left 09/10/2017   Procedure: PERIPHERAL VASCULAR BALLOON ANGIOPLASTY;  Surgeon: Wellington Hampshire, MD;  Location: [place] [facility];  Service: Cardiovascular;  Laterality: Left;  ANTERIAL TIBIAL  . PERIPHERAL VASCULAR CATHETERIZATION Right 03/26/2016   Procedure: Lower Extremity Angiography;  Surgeon: Katha Cabal, MD;  Location: [place] [facility];  Service: Cardiovascular;  Laterality: Right;  . PERIPHERAL VASCULAR INTERVENTION Left 09/10/2017   Procedure: PERIPHERAL VASCULAR INTERVENTION;  Surgeon: Wellington Hampshire, MD;  Location: [place] [facility];  Service: Cardiovascular;  Laterality: Left;  SFA/POPLITEAL     Allergies  Allergies  Allergen Reactions  . Penicillins Rash    Has patient had a PCN reaction causing immediate rash, facial/tongue/throat swelling, SOB or lightheadedness with hypotension: Yes Has patient had a PCN reaction causing severe rash involving mucus membranes or  skin necrosis: Yes Has patient had a PCN reaction that required hospitalization: No Has patient had a PCN reaction occurring within the last 10 years: No If all of the above answers are "NO", then may proceed with Cephalosporin use.     History of Present Illness    54 y/o ? with the above complex PMH  including CAD s/p prior RCA stenting in 2012 (nl EF by echo in 2013, PAD s/p bilat lower ext PTA/DEB in 2017 and 2019 x 2, HTN, HL, DMII, and ESRD on HD.   prev evaluated @ [facility] and was found to have an abnl stress echo in 2012, leading to cath with finding of severe RCA dzs, which was treated w/ DES.  He had moderate, residual dzs elsewhere, including severe small vessel dzs involving his D1 and Ramus.  He was medically managed and previously followed @ [facility].   he has been followed by vascular surgery in the setting of claudication and lower extremity gangrene req RLE PTA in 2017 and LLE PTA x 2 in 2019.  He was last seen by Dr. Fletcher Anon in 09/2017.  He reports having worsening lower extremity edema and some general malaise, possibly some shortness of breath the past 2 or more weeks.  Reports his fluid did not improve with dialysis, had continued leg swelling.   Yesterday was at dialysis when in the last hour he started coughing.  Reports he tested negative for Covid on 2 occasions.  Secondary to his persistent cough, it was recommended he have an x-ray It was suggested he try to have this done as outpatient but as they were unable to set this up, it was recommended he go to the emergency room to have x-ray.  He denied having any chest pain or significant shortness of breath, only cough.  Review of records over the past month, he has had sinus infection and bronchitis.  Again tested negative for Covid  On my evaluation today he reports that he feels at his baseline, no significant cough, no shortness of breath no chest discomfort Denies having any lower extremity edema at this time  Inpatient Medications    . amLODipine  5 mg Oral QPM  . aspirin EC  81 mg Oral QPM  . atorvastatin  40 mg Oral QPM  . carvedilol  25 mg Oral BID WC  . clopidogrel  75 mg Oral Daily  . dexamethasone (DECADRON) injection  6 mg Intravenous Q24H  . escitalopram  10 mg Oral Daily  . insulin aspart  0-15 Units  Subcutaneous TID WC  . insulin aspart  0-5 Units Subcutaneous QHS  . insulin glargine  10 Units Subcutaneous QHS  . Ipratropium-Albuterol  1 puff Inhalation Q6H  . lisinopril  2.5 mg Oral QPM  . sevelamer carbonate  1,600 mg Oral With snacks  . sevelamer carbonate  3,200 mg Oral TID WC    Family History    Family History  Problem Relation Age of Onset  . Hypertension Mother    He indicated that his mother is alive. He indicated that his father is alive.   Social History    Social History   Socioeconomic History  . Marital status: Single    Spouse name: Not on file  . Number of children: Not on file  . Years of education: Not on file  . Highest education level: Not on file  Occupational History  . Not on file  Tobacco Use  . Smoking status: Never Smoker  . Smokeless  tobacco: Never Used  Substance and Sexual Activity  . Alcohol use: No  . Drug use: No  . Sexual activity: Not on file  Other Topics Concern  . Not on file  Social History Narrative  . Not on file   Social Determinants of Health   Financial Resource Strain:   . Difficulty of Paying Living Expenses: Not on file  Food Insecurity:   . Worried About Charity fundraiser in the Last Year: Not on file  . Ran Out of Food in the Last Year: Not on file  Transportation Needs:   . Lack of Transportation (Medical): Not on file  . Lack of Transportation (Non-Medical): Not on file  Physical Activity:   . Days of Exercise per Week: Not on file  . Minutes of Exercise per Session: Not on file  Stress:   . Feeling of Stress : Not on file  Social Connections:   . Frequency of Communication with Friends and Family: Not on file  . Frequency of Social Gatherings with Friends and Family: Not on file  . Attends Religious Services: Not on file  . Active Member of Clubs or Organizations: Not on file  . Attends Archivist Meetings: Not on file  . Marital Status: Not on file  Intimate Partner Violence:   . Fear  of Current or Ex-Partner: Not on file  . Emotionally Abused: Not on file  . Physically Abused: Not on file  . Sexually Abused: Not on file     Review of Systems    General:  No chills, fever, night sweats or weight changes.  Cardiovascular:  No chest pain, dyspnea on exertion,  +edema now resolved, orthopnea, palpitations, paroxysmal nocturnal dyspnea. Dermatological: No rash, lesions/masses Respiratory: + cough, no significant  dyspnea Urologic: No hematuria, dysuria Abdominal:   No nausea, vomiting, diarrhea, bright red blood per rectum, melena, or hematemesis Neurologic:  No visual changes, wkns, changes in mental status. All other systems reviewed and are otherwise negative except as noted above.  Physical Exam    Blood pressure 107/76, pulse 80, temperature (!) 97.5 F (36.4 C), resp. rate 15, height 6' (1.829 m), weight 109.3 kg, SpO2 92 %.  General: Pleasant, NAD Psych: Normal affect. Neuro: Alert and oriented X 3. Moves all extremities spontaneously. HEENT: Normal  Neck: Supple without bruits or JVD. Lungs:  Resp regular and unlabored, CTA. Heart: RRR no s3, s4, or murmurs. Abdomen: Soft, non-tender, non-distended, BS + x 4.  Extremities: No clubbing, cyanosis or edema. DP/PT/Radials 2+ and equal bilaterally.  Labs    Cardiac Enzymes Recent Labs  Lab 07/26/19 2000 07/26/19 2240 07/27/19 0329  TROPONINIHS 491* 750* 1,143*      Lab Results  Component Value Date   WBC 3.6 (L) 07/28/2019   HGB 10.7 (L) 07/28/2019   HCT 33.2 (L) 07/28/2019   MCV 97.1 07/28/2019   PLT 159 07/28/2019    Recent Labs  Lab 07/28/19 0230  NA 130*  K 4.5  CL 86*  CO2 29  BUN 44*  CREATININE 7.89*  CALCIUM 8.2*  PROT 6.1*  BILITOT 0.6  ALKPHOS 70  ALT 16  AST 17  GLUCOSE 238*   No results found for: CHOL, HDL, LDLCALC, TRIG Lab Results  Component Value Date   DDIMER 0.85 (H) 07/27/2019     Radiology Studies    DG Chest Port 1 View  Result Date:  07/26/2019 CLINICAL DATA:  Leg swelling for 2 weeks with shortness of breath EXAM:  PORTABLE CHEST 1 VIEW COMPARISON:  12/01/2007 FINDINGS: Cardiac shadow is mildly prominent but accentuated by the portable technique. Elevation the right hemidiaphragm is seen. Mild vascular congestion is noted without focal infiltrate or parenchymal edema. No acute bony abnormality is seen. IMPRESSION: Mild vascular congestion. Electronically Signed   By: Inez Catalina M.D.   On: 07/26/2019 21:25   ECHOCARDIOGRAM COMPLETE  Result Date: 07/27/2019   ECHOCARDIOGRAM REPORT   Patient Name:   ESGAR WAYMIRE Date of Exam: 07/27/2019 Medical Rec #:  [id number]            Height:       72.0 in Accession #:    [id number]           Weight:       246.9 lb Date of Birth:  [birth year]            BSA:          2.33 m Patient Age:    76 years             BP:           109/67 mmHg Patient Gender: M                    HR:           76 bpm. Exam Location:  [facility] Procedure: 2D Echo, Cardiac Doppler and Color Doppler Indications:     Dyspnea 786.09  History:         Patient has no prior history of Echocardiogram examinations.                  CAD; Risk Factors:Hypertension and Diabetes.  Sonographer:     Sherrie Sport RDCS (AE) Referring Phys:  [id number] Nicolette Bang Diagnosing Phys: Yolonda Kida MD IMPRESSIONS  1. Left ventricular ejection fraction, by visual estimation, is 25 to 30%. The left ventricle has moderate to severely decreased function. Left ventricular septal wall thickness was normal. Normal left ventricular posterior wall thickness. There is no left ventricular hypertrophy.  2. Left ventricular diastolic parameters are consistent with Grade I diastolic dysfunction (impaired relaxation).  3. 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  4. Moderately dilated left ventricular internal cavity size.  5. The left ventricle demonstrates regional wall motion abnormalities.  6. Global right ventricle has low  normal systolic function.The right ventricular size is normal. Mildly increased right ventricular wall thickness.  7. Left atrial size was normal.  8. Right atrial size was mildly dilated.  9. The mitral valve is normal in structure. Trivial mitral valve regurgitation. 10. The tricuspid valve is normal in structure. 11. The aortic valve is grossly normal. Aortic valve regurgitation is not visualized. Mild aortic valve sclerosis without stenosis. 12. The pulmonic valve was grossly normal. Pulmonic valve regurgitation is not visualized. 13. Moderately elevated pulmonary artery systolic pressure. FINDINGS  Left Ventricle: Left ventricular ejection fraction, by visual estimation, is 25 to 30%. The left ventricle has moderate to severely decreased function. The left ventricle demonstrates regional wall motion abnormalities. The left ventricular internal cavity size was moderately dilated left ventricle. Normal left ventricular posterior wall thickness. There is no left ventricular hypertrophy. Left ventricular diastolic parameters are consistent with Grade I diastolic dysfunction (impaired relaxation). There is evidence of a moderate to large inferior and posterior myocardial infarction. Right Ventricle: The right ventricular size is normal. Mildly increased right ventricular wall thickness. Global RV systolic function is has low normal systolic function.  The tricuspid regurgitant velocity is 3.19 m/s, and with an assumed right atrial pressure of 10 mmHg, the estimated right ventricular systolic pressure is moderately elevated at 50.7 mmHg. Left Atrium: Left atrial size was normal in size. Right Atrium: Right atrial size was mildly dilated Pericardium: There is no evidence of pericardial effusion. Mitral Valve: The mitral valve is normal in structure. Trivial mitral valve regurgitation. Tricuspid Valve: The tricuspid valve is normal in structure. Tricuspid valve regurgitation is mild. Aortic Valve: The aortic valve is  grossly normal. Aortic valve regurgitation is not visualized. Mild aortic valve sclerosis is present, with no evidence of aortic valve stenosis. Pulmonic Valve: The pulmonic valve was grossly normal. Pulmonic valve regurgitation is not visualized. Pulmonic regurgitation is not visualized. Aorta: The aortic root is normal in size and structure. IAS/Shunts: No atrial level shunt detected by color flow Doppler.  LEFT VENTRICLE PLAX 2D LVIDd:         6.15 cm LVIDs:         5.37 cm LV PW:         1.19 cm LV IVS:        1.53 cm LVOT diam:     2.10 cm LV SV:         51 ml LV SV Index:   21.05 LVOT Area:     3.46 cm  LEFT ATRIUM         Index LA diam:    4.60 cm 1.97 cm/m                        PULMONIC VALVE AORTA                 PV Vmax:        0.52 m/s Ao Root diam: 3.00 cm PV Peak grad:   1.1 mmHg                       RVOT Peak grad: 2 mmHg  TRICUSPID VALVE TR Peak grad:   40.7 mmHg TR Vmax:        319.00 cm/s  SHUNTS Systemic Diam: 2.10 cm  Dwayne D Callwood MD Electronically signed by Yolonda Kida MD Signature Date/Time: 07/27/2019/11:51:06 AM    Final     ECG & Cardiac Imaging    Normal sinus rhythm with right bundle branch block, interventricular conduction delay left anterior fascicular block- personally reviewed.  Assessment & Plan    1.  Non-STEMI Known coronary artery disease, prior stent to RCA No symptoms of angina, no shortness of breath on presentation Only presented to the hospital for chest x-ray for cough that developed during dialysis that he could not arrange as an outpatient -Noted to have elevated cardiac enzymes, Covid positive --Currently on heparin infusion -Echocardiogram results reviewed showing inferior wall hypokinesis, moderate to severely depressed ejection fraction 25 to 30% --This suggest possible occlusion of RCA stent at a prior date possibly several weeks ago -By presentation on echo, mild elevation of troponin, this is not a primary ischemic event and likely  demand ischemia in the setting of Covid and cough --Given he is asymptomatic, no urgency for cardiac catheterization at this time --I have discussed various treatment options with him, he is comfortable with waiting until his Covid infection has resolved and then proceeding with cardiac catheterization at a later date --Catheterization can be arranged as outpatient --We will look to discontinue heparin tomorrow morning.  Enzymes have peaked and  trending downward  2.  Covid positive  sinus congestion over the past month with possible bronchitis previously treated Yesterday at dialysis with significant cough, nonproductive Tested positive -X-ray with mild vascular congestion, no other acute findings -Can likely be managed at home  3. CAD with stable angina Known coronary artery disease, prior stent to RCA  4.  End-stage renal disease on hemodialysis Reports he has not missed any HD sessions Appears relatively euvolemic on exam though on echocardiogram has moderately elevated right heart pressures --- Would recommend we push for lower dry weight  5.  Cardiomyopathy Presumed to be ischemic in nature given known coronary artery disease, prior RCA stent  drop in ejection fraction, in the past several years though timing unclear -Relatively asymptomatic Plan as above    Total encounter time more than 110 minutes  Greater than 50% was spent in counseling and coordination of care with the patient    Signed, Esmond Plants, MD, Ph.D [facility]  For questions or updates, please contact   Please consult [email or web address] for contact info under Cardiology/STEMI.

## 2019-07-28 NOTE — Progress Notes (Signed)
PROGRESS NOTE    [name]  [id number] DOB: February 11, 1966 DOA: 07/26/2019 PCP: Vidal Schwalbe, MD      Brief Narrative:  Ralph Peterson is a 54 y.o. Caucasian M with ESRD on HD MWF, HTN, DM, and CAD who presented with few days progressive SOB and edema.   Assessment & Plan:  COVID-19 pneumonitis with hypoxic respiratory failure In the ER, COVID+, fever.  CXR showed faint bilateral opacities.  SpO2 79%, RR up to 40, needed 3L O2 initially. --On RA today. PLAN: -Continue remdesivir -Continue dexamethasone   NSTEMI Hx of CAD Hx of stent to distal RCA in 2012 Patient noted to have elevated trop.  ECG compared to previous, and shows RBBB, possible strain pattern in V1, anterior leads, no ischemic changes relative to Feb 2020 ECG.  --Echocardiogram results reviewed showing inferior wall hypokinesis, moderate to severely depressed ejection fraction 25 to 30%. --Trop peaked at 1574.  Per cards, "mild elevation of troponin, this is not a primary ischemic event and likely demand ischemia in the setting of Covid and cough." PLAN: --started heparin gtt this morning, will continue until tomorrow morning, per cards. --No need for urgent heart cath, can be done as outpatient, per cards. -Continue Plavix, Coreg, atorvastatin, aspirin, lisinopril  Systolic CHF with EF [id number] --new dx, but may have developed in the past several years. --"Appears relatively euvolemic on exam though on echocardiogram has moderately elevated right heart pressures", per cards. --iHD to push for lower dry weight  Hypertension BP low normal -Continue amlodipine, coreg and Lisinopril  Diabetes -Hold glipizide --sliding scale corrections and Lantus  ESRD on HD MWF -Continue sevelamer --iHD per nephrology  Mood disorder -Continue escitalopram     DVT prophylaxis: on heparin gtt Code Status: FULL Family Communication: not today Disposition: Home in 1-2 days    Subjective and interval history:    Pt reported doing fine.  No chest pain.  No fever, dyspnea, abdominal pain, N/V/D, increased swelling.  Does not make urine.  Trop continued to trend up this morning.  Heparin gtt started.  Cardiology consulted.   Objective: Vitals:   07/27/19 1603 07/27/19 2047 07/28/19 0557 07/28/19 0811  BP: 110/72 97/73 108/74 107/76  Pulse: 79 85 78 80  Resp: 17 20 19 15   Temp: 97.6 F (36.4 C) 99 F (37.2 C) 97.8 F (36.6 C) (!) 97.5 F (36.4 C)  TempSrc: Oral Axillary Oral   SpO2: 95% 95% 92% 92%  Weight:   109.3 kg   Height:        Intake/Output Summary (Last 24 hours) at 07/28/2019 1436 Last data filed at 07/28/2019 1256 Gross per 24 hour  Intake 146.58 ml  Output --  Net 146.58 ml   Filed Weights   07/26/19 1955 07/28/19 0557  Weight: 112 kg 109.3 kg    Examination: Constitutional: NAD, AAOx3 HEENT: conjunctivae and lids normal, EOMI CV: RRR no M,R,G. Distal pulses +2.  No cyanosis.   RESP: CTA B/L, normal respiratory effort, on RA GI: +BS, NTND Extremities: No effusions, edema, or tenderness in BLE MSK: no joint enlargement or tenderness of both UE and LE SKIN: warm, dry and intact Neuro: II - XII grossly intact.  Sensation intact Psych: Normal mood and affect.  Appropriate judgement and reason   Data Reviewed: I have personally reviewed following labs and imaging studies:  CBC: Recent Labs  Lab 07/26/19 1959 07/28/19 0230  WBC 3.4* 3.6*  NEUTROABS  --  2.3  HGB 10.8* 10.7*  HCT 34.8*  33.2*  MCV 98.3 97.1  PLT 153 [id number]   Basic Metabolic Panel: Recent Labs  Lab 07/26/19 1959 07/28/19 0230  NA 134* 130*  K 3.1* 4.5  CL 92* 86*  CO2 28 29  GLUCOSE 100* 238*  BUN 14 44*  CREATININE 4.89* 7.89*  CALCIUM 8.3* 8.2*  MG  --  2.3   GFR: Estimated Creatinine Clearance: 14.1 mL/min (A) (by C-G formula based on SCr of 7.89 mg/dL (H)). Liver Function Tests: Recent Labs  Lab 07/28/19 0230  AST 17  ALT 16  ALKPHOS 70  BILITOT 0.6  PROT 6.1*  ALBUMIN  3.1*   No results for input(s): LIPASE, AMYLASE in the last 168 hours. No results for input(s): AMMONIA in the last 168 hours. Coagulation Profile: Recent Labs  Lab 07/28/19 1015  INR 1.2   Cardiac Enzymes: No results for input(s): CKTOTAL, CKMB, CKMBINDEX, TROPONINI in the last 168 hours. BNP (last 3 results) No results for input(s): PROBNP in the last 8760 hours. HbA1C: Recent Labs    07/27/19 0329  HGBA1C 8.5*   CBG: Recent Labs  Lab 07/27/19 1312 07/27/19 1639 07/27/19 2201 07/28/19 0809 07/28/19 1138  GLUCAP 141* 374* 295* 274* 307*   Lipid Profile: No results for input(s): CHOL, HDL, LDLCALC, TRIG, CHOLHDL, LDLDIRECT in the last 72 hours. Thyroid Function Tests: No results for input(s): TSH, T4TOTAL, FREET4, T3FREE, THYROIDAB in the last 72 hours. Anemia Panel: Recent Labs    07/27/19 0329 07/28/19 0230  FERRITIN 2,481* 2,065*   Urine analysis: No results found for: COLORURINE, APPEARANCEUR, LABSPEC, PHURINE, GLUCOSEU, HGBUR, BILIRUBINUR, KETONESUR, PROTEINUR, UROBILINOGEN, NITRITE, LEUKOCYTESUR Sepsis Labs: @LABRCNTIP (procalcitonin:4,lacticacidven:4)  ) Recent Results (from the past 240 hour(s))  Culture, blood (routine x 2)     Status: None (Preliminary result)   Collection Time: 07/26/19 10:40 PM   Specimen: BLOOD  Result Value Ref Range Status   Specimen Description BLOOD BLOOD LEFT WRIST  Final   Special Requests   Final    BOTTLES DRAWN AEROBIC AND ANAEROBIC Blood Culture adequate volume   Culture   Final    NO GROWTH 2 DAYS Performed at [facility], [address] [place], [place] [address]    Report Status PENDING  Incomplete  Culture, blood (routine x 2)     Status: None (Preliminary result)   Collection Time: 07/26/19 10:40 PM   Specimen: BLOOD  Result Value Ref Range Status   Specimen Description BLOOD BLOOD LEFT HAND  Final   Special Requests   Final    BOTTLES DRAWN AEROBIC AND ANAEROBIC Blood Culture adequate volume    Culture   Final    NO GROWTH 2 DAYS Performed at [facility], [address], [place], [place] [address]    Report Status PENDING  Incomplete         Radiology Studies: DG Chest Port 1 View  Result Date: 07/26/2019 CLINICAL DATA:  Leg swelling for 2 weeks with shortness of breath EXAM: PORTABLE CHEST 1 VIEW COMPARISON:  12/01/2007 FINDINGS: Cardiac shadow is mildly prominent but accentuated by the portable technique. Elevation the right hemidiaphragm is seen. Mild vascular congestion is noted without focal infiltrate or parenchymal edema. No acute bony abnormality is seen. IMPRESSION: Mild vascular congestion. Electronically Signed   By: Inez Catalina M.D.   On: 07/26/2019 21:25   ECHOCARDIOGRAM COMPLETE  Result Date: 07/27/2019   ECHOCARDIOGRAM REPORT   Patient Name:   Ralph Peterson Date of Exam: 07/27/2019 Medical Rec #:  [id number]  Height:       72.0 in Accession #:    [id number]           Weight:       246.9 lb Date of Birth:  [birth year]            BSA:          2.33 m Patient Age:    49 years             BP:           109/67 mmHg Patient Gender: M                    HR:           76 bpm. Exam Location:  [facility] Procedure: 2D Echo, Cardiac Doppler and Color Doppler Indications:     Dyspnea 786.09  History:         Patient has no prior history of Echocardiogram examinations.                  CAD; Risk Factors:Hypertension and Diabetes.  Sonographer:     Sherrie Sport RDCS (AE) Referring Phys:  [id number] Nicolette Bang Diagnosing Phys: Yolonda Kida MD IMPRESSIONS  1. Left ventricular ejection fraction, by visual estimation, is 25 to 30%. The left ventricle has moderate to severely decreased function. Left ventricular septal wall thickness was normal. Normal left ventricular posterior wall thickness. There is no left ventricular hypertrophy.  2. Left ventricular diastolic parameters are consistent with Grade I diastolic dysfunction (impaired relaxation).  3. Moderate  to large inferior and posterior myocardial infarction.  4. Moderately dilated left ventricular internal cavity size.  5. The left ventricle demonstrates regional wall motion abnormalities.  6. Global right ventricle has low normal systolic function.The right ventricular size is normal. Mildly increased right ventricular wall thickness.  7. Left atrial size was normal.  8. Right atrial size was mildly dilated.  9. The mitral valve is normal in structure. Trivial mitral valve regurgitation. 10. The tricuspid valve is normal in structure. 11. The aortic valve is grossly normal. Aortic valve regurgitation is not visualized. Mild aortic valve sclerosis without stenosis. 12. The pulmonic valve was grossly normal. Pulmonic valve regurgitation is not visualized. 13. Moderately elevated pulmonary artery systolic pressure. FINDINGS  Left Ventricle: Left ventricular ejection fraction, by visual estimation, is 25 to 30%. The left ventricle has moderate to severely decreased function. The left ventricle demonstrates regional wall motion abnormalities. The left ventricular internal cavity size was moderately dilated left ventricle. Normal left ventricular posterior wall thickness. There is no left ventricular hypertrophy. Left ventricular diastolic parameters are consistent with Grade I diastolic dysfunction (impaired relaxation). There is evidence of a moderate to large inferior and posterior myocardial infarction. Right Ventricle: The right ventricular size is normal. Mildly increased right ventricular wall thickness. Global RV systolic function is has low normal systolic function. The tricuspid regurgitant velocity is 3.19 m/s, and with an assumed right atrial pressure of 10 mmHg, the estimated right ventricular systolic pressure is moderately elevated at 50.7 mmHg. Left Atrium: Left atrial size was normal in size. Right Atrium: Right atrial size was mildly dilated Pericardium: There is no evidence of pericardial effusion.  Mitral Valve: The mitral valve is normal in structure. Trivial mitral valve regurgitation. Tricuspid Valve: The tricuspid valve is normal in structure. Tricuspid valve regurgitation is mild. Aortic Valve: The aortic valve is grossly normal. Aortic valve regurgitation is not visualized. Mild aortic valve sclerosis is  present, with no evidence of aortic valve stenosis. Pulmonic Valve: The pulmonic valve was grossly normal. Pulmonic valve regurgitation is not visualized. Pulmonic regurgitation is not visualized. Aorta: The aortic root is normal in size and structure. IAS/Shunts: No atrial level shunt detected by color flow Doppler.  LEFT VENTRICLE PLAX 2D LVIDd:         6.15 cm LVIDs:         5.37 cm LV PW:         1.19 cm LV IVS:        1.53 cm LVOT diam:     2.10 cm LV SV:         51 ml LV SV Index:   21.05 LVOT Area:     3.46 cm  LEFT ATRIUM         Index LA diam:    4.60 cm 1.97 cm/m                        PULMONIC VALVE AORTA                 PV Vmax:        0.52 m/s Ao Root diam: 3.00 cm PV Peak grad:   1.1 mmHg                       RVOT Peak grad: 2 mmHg  TRICUSPID VALVE TR Peak grad:   40.7 mmHg TR Vmax:        319.00 cm/s  SHUNTS Systemic Diam: 2.10 cm  Dwayne D Callwood MD Electronically signed by Yolonda Kida MD Signature Date/Time: 07/27/2019/11:51:06 AM    Final         Scheduled Meds: . aspirin EC  81 mg Oral QPM  . atorvastatin  40 mg Oral QPM  . carvedilol  25 mg Oral BID WC  . clopidogrel  75 mg Oral Daily  . dexamethasone (DECADRON) injection  6 mg Intravenous Q24H  . escitalopram  10 mg Oral Daily  . insulin aspart  0-15 Units Subcutaneous TID WC  . insulin aspart  0-5 Units Subcutaneous QHS  . insulin glargine  10 Units Subcutaneous QHS  . Ipratropium-Albuterol  1 puff Inhalation Q6H  . sevelamer carbonate  1,600 mg Oral With snacks  . sevelamer carbonate  3,200 mg Oral TID WC   Continuous Infusions: . sodium chloride Stopped (07/28/19 1226)  . heparin 1,200 Units/hr  (07/28/19 1256)  . remdesivir 100 mg in NS 100 mL Stopped (07/28/19 1204)     LOS: 1 day    Enzo Bi, MD [facility] 07/28/2019, 2:36 PM     Please page though [place] or Epic secure chat:  For password, contact charge nurse

## 2019-07-28 NOTE — Progress Notes (Signed)
Pre HD Assessment    07/28/19 1930  Neurological  Level of Consciousness Alert  Orientation Level Oriented X4  Respiratory  Respiratory Pattern Regular;Unlabored  Chest Assessment Chest expansion symmetrical  Bilateral Breath Sounds Clear;Diminished  Cough None  Cardiac  Pulse Regular  Heart Sounds S1, S2  ECG Monitor Yes  Cardiac Rhythm NSR  Vascular  R Radial Pulse +2  L Radial Pulse +2  Edema Generalized  Psychosocial  Psychosocial (WDL) WDL

## 2019-07-28 NOTE — Progress Notes (Signed)
Ch contacted pt regarding AD education. Pt is a 46 YOM that is here for edema, SOB, and COVID+. Pt lives alone and has been on HD on the MWF schedule for ~7.5 years. Pt shared that his admission came about from an office visit to his cardiologist for a chest X-ray that led to immediate admission where the pt learned he was COVID+. Ch allowed space for pt to lament and also provided AD education to the pt. The pt desires his son, Lysbeth Galas to be his HPOA. The pt's son will be returning to college at [employer] in a few days. Ch discussed the pt's TOC since he is a dialysis pt and needs to have access to treatment once d/c. Pt shared that he is aware that he can make arrangements to receive treatment at a facility in [place], [place] if he is still COVID+ upon d/c. Ch informed pt that he would be given the AD packet by his nurse or NT. Pt appreciated the call and help with the matter.  No further needs at this time.       07/28/19 1200  Clinical Encounter Type  Visited With Patient  Visit Type Social support;Other (Comment) (AD edu)  Referral From Nurse  Consult/Referral To Chaplain  Spiritual Encounters  Spiritual Needs Emotional  Stress Factors  Patient Stress Factors Exhausted;Health changes;Major life changes  Family Stress Factors Major life changes

## 2019-07-28 NOTE — Progress Notes (Signed)
Patient admitted with COVID-19.  He normally dialyzes MWF at [facility].  Dialysis orders have been prepared.  We will maintain the patient on MWF schedule while here.

## 2019-07-28 NOTE — Progress Notes (Signed)
Initial Nutrition Assessment  DOCUMENTATION CODES:   Not applicable  INTERVENTION:  Provide Ensure Max Protein po once daily, each supplement provides 150 kcal and 30 grams of protein.  Encouraged adequate intake of calories and protein from meals and snacks.  NUTRITION DIAGNOSIS:   Increased nutrient needs related to catabolic [id number] as evidenced by estimated needs.  GOAL:   Patient will meet greater than or equal to 90% of their needs  MONITOR:   PO intake, Supplement acceptance, Labs, Weight trends, I & O's  REASON FOR ASSESSMENT:   Malnutrition Screening Tool    ASSESSMENT:   54 year old male with PMHx of CAD, HTN, HLD, DM, ESRD on HD, CAD admitted with COVID-19 pneumonitis, new onset CHF.   Spoke with patient over the phone. He reports his appetite has been decreased for approximately 2 weeks now. He is still able to eat at least 50% of his meals and reports he is eating the protein portion of meals. Discussed catabolic nature of [id number] and increased calorie/protein needs. He is amenable with drinking ONS. He cannot tolerate Nepro as it causes diarrhea and he already has diarrhea right now. He is amenable to trying Ensure Max Protein once daily.  Patient reports his UBW is 258 lbs and he had lost down to around 249 lbs. If current weight is accurate patient is now only 109.3 kg (240.96 lbs). Unsure when patient weighed 258 lbs but according to chart he was 122 kg on 01/21/2019.   Medications reviewed and include: Decadron 6 mg Q24 hrs IV, Novolog 0-15 units TID, Novolog 0-5 units QHS, Lantus 10 units QHS, Renvela 3200 mg with meals, Renvela 1600 mg with snacks, remdesivir.  Labs reviewed: CBG 274-374, Sodium 130, BUN 44, Creatinine 7.89.  Patient is at risk for acute malnutrition related to COVID-19.   NUTRITION - FOCUSED PHYSICAL EXAM:  Unable to complete.  Diet Order:   Diet Order            Diet heart healthy/carb modified Room service appropriate?  Yes; Fluid consistency: Thin  Diet effective now             EDUCATION NEEDS:   No education needs have been identified at this time  Skin:  Skin Assessment: Reviewed RN Assessment  Last BM:  1/6 type 7  Height:   Ht Readings from Last 1 Encounters:  07/26/19 6' (1.829 m)   Weight:   Wt Readings from Last 1 Encounters:  07/28/19 109.3 kg   BMI:  Body mass index is 32.68 kg/m.  Estimated Nutritional Needs:   Kcal:  [id number]  Protein:  115-125 grams  Fluid:  UOP + 1 L  Jacklynn Barnacle, MS, RD, LDN Office: [phone] Pager: [phone] After Hours/Weekend Pager: [phone]

## 2019-07-28 NOTE — Progress Notes (Signed)
HD TX started    07/28/19 1937  Hand-Off documentation  Report given to (Full Name) Beatris Ship, RN   Report received from (Full Name) Candace, RN   Vital Signs  Temp 98.2 F (36.8 C)  Temp Source Oral  Pulse Rate 74  Pulse Rate Source Monitor  Resp (!) 22  BP 108/76  BP Location Left Arm  BP Method Automatic  Patient Position (if appropriate) Lying  Oxygen Therapy  SpO2 94 %  O2 Device Room Air  During Hemodialysis Assessment  Blood Flow Rate (mL/min) 400 mL/min  Arterial Pressure (mmHg) -230 mmHg  Venous Pressure (mmHg) 180 mmHg  Transmembrane Pressure (mmHg) 60 mmHg  Ultrafiltration Rate (mL/min) 430 mL/min  Dialysate Flow Rate (mL/min) 800 ml/min  Conductivity: Machine  13.6  HD Safety Checks Performed Yes  Intra-Hemodialysis Comments Tx initiated  Fistula / Graft Right Forearm Arteriovenous fistula  Placement Date/Time: 07/27/19 1855   Placed prior to admission: Yes  Orientation: Right  Access Location: Forearm  Access Type: Arteriovenous fistula  Site Condition No complications  Fistula / Graft Assessment Thrill;Bruit;Present  Status Accessed  Needle Size 15 g  Drainage Description None

## 2019-07-28 NOTE — Progress Notes (Signed)
Pre HD Tx   07/28/19 1930  Hand-Off documentation  Report given to (Full Name) Beatris Ship, RN   Report received from (Full Name) Candace, RN   Vital Signs  Temp 98.2 F (36.8 C)  Temp Source Oral  Pulse Rate 74  Pulse Rate Source Monitor  Resp (!) 21  BP 110/75  BP Location Left Arm  BP Method Automatic  Patient Position (if appropriate) Lying  Oxygen Therapy  SpO2 94 %  O2 Device Room Air  Pain Assessment  Pain Scale 0-10  Pain Score 0  Dialysis Weight  Weight 110 kg  Type of Weight Pre-Dialysis  Time-Out for Hemodialysis  What Procedure? HD  Pt Identifiers(min of two) First/Last Name;MRN/Account#  Correct Site? Yes  Correct Side? Yes  Correct Procedure? Yes  Consents Verified? Yes  Rad Studies Available? N/A  Safety Precautions Reviewed? Yes  Engineer, civil (consulting) Number 5  Station Number  (Bedside Room 111)  UF/Alarm Test Passed  Conductivity: Meter 13.6  Conductivity: Machine  13.7  pH 7.2  Reverse Osmosis WRO 3  Normal Saline Lot Number [id number]  Dialyzer Lot Number [id number]  Disposable Set Lot Number [id number]  Machine Temperature 98.6 F (37 C)  Musician and Audible Yes  Blood Lines Intact and Secured Yes  Pre Treatment Patient Checks  Vascular access used during treatment Fistula  Hepatitis B Surface Antigen Results Negative  Date Hepatitis B Surface Antigen Drawn 01/07/19  Isolation Initiated Yes  Hepatitis B Surface Antibody 12  Date Hepatitis B Surface Antibody Drawn 01/07/19  Hemodialysis Consent Verified Yes  Hemodialysis Standing Orders Initiated Yes  ECG (Telemetry) Monitor On Yes  Prime Ordered Normal Saline  Length of  DialysisTreatment -hour(s) 2.5 Hour(s) (Verbal order change )  Dialysis Treatment Comments Na 140  Dialyzer Elisio 17H NR  Dialysate 2.5 Ca;2K  Dialysis Anticoagulant None  Dialysate Flow Ordered 800  Blood Flow Rate Ordered 400 mL/min  Ultrafiltration Goal 1.5 Liters  Dialysis Blood Pressure Support  Ordered Normal Saline  Education / Care Plan  Dialysis Education Provided Yes  Documented Education in Care Plan Yes  Fistula / Graft Right Forearm Arteriovenous fistula  Placement Date/Time: 07/27/19 1855   Placed prior to admission: Yes  Orientation: Right  Access Location: Forearm  Access Type: Arteriovenous fistula  Site Condition No complications  Fistula / Graft Assessment Thrill;Bruit;Present  Status Patent  Drainage Description None

## 2019-07-28 NOTE — Consult Note (Signed)
ANTICOAGULATION CONSULT NOTE - Follow Up Consult  Pharmacy Consult for Heparin Infusion  Indication:ACS/STEMI  Allergies  Allergen Reactions  . Penicillins Rash    Has patient had a PCN reaction causing immediate rash, facial/tongue/throat swelling, SOB or lightheadedness with hypotension: Yes Has patient had a PCN reaction causing severe rash involving mucus membranes or skin necrosis: Yes Has patient had a PCN reaction that required hospitalization: No Has patient had a PCN reaction occurring within the last 10 years: No If all of the above answers are "NO", then may proceed with Cephalosporin use.    Patient Measurements: Height: 6' (182.9 cm) Weight: 242 lb 8.1 oz (110 kg) IBW/kg (Calculated) : 77.6  Vital Signs: Temp: 98.2 F (36.8 C) (01/06 1937) Temp Source: Oral (01/06 1937) BP: 113/74 (01/06 2145) Pulse Rate: 75 (01/06 2145)  Labs: Recent Labs    07/26/19 1959 07/27/19 0329 07/28/19 0230 07/28/19 1015 07/28/19 1215 07/28/19 2128  HGB 10.8*  --  10.7*  --   --   --   HCT 34.8*  --  33.2*  --   --   --   PLT 153  --  159  --   --   --   APTT  --   --   --  33  --   --   LABPROT  --   --   --  14.7  --   --   INR  --   --   --  1.2  --   --   HEPARINUNFRC  --   --   --  <0.10*  --  0.77*  CREATININE 4.89*  --  7.89*  --   --   --   TROPONINIHS  --  1,143*  --  1,574* 1,489*  --    Estimated Creatinine Clearance: 14.2 mL/min (A) (by C-G formula based on SCr of 7.89 mg/dL (H)).  Assessment: Pharmacy consulted for heparin infusion dosing and monitoring for 54 yo male for ACS. Patient has a PMH of ESRD and is COVID (+). Patient received enoxaparin 30mg  x 1 dose 1/5 @ 0418.   1/5 Troponin HS: 1143 1/6 Troponin HS: pending   Goal of Therapy:  Heparin level 0.3-0.7 units/ml Monitor platelets by anticoagulation protocol: Yes   Plan:  0106 @ 2128 HL = 0.77, supratherapeutic.  Will decrease infusion to 1100 units/hr and recheck HL in 8 hrs CBC daily per  protocol  Ena Dawley, PharmD Clinical Pharmacist 07/28/2019 10:04 PM

## 2019-07-29 ENCOUNTER — Ambulatory Visit (INDEPENDENT_AMBULATORY_CARE_PROVIDER_SITE_OTHER): Payer: Medicare PPO | Admitting: Vascular Surgery

## 2019-07-29 ENCOUNTER — Encounter (INDEPENDENT_AMBULATORY_CARE_PROVIDER_SITE_OTHER): Payer: Medicare Other

## 2019-07-29 DIAGNOSIS — R778 Other specified abnormalities of plasma proteins: Secondary | ICD-10-CM

## 2019-07-29 LAB — FERRITIN: Ferritin: 2472 ng/mL — ABNORMAL HIGH (ref 24–336)

## 2019-07-29 LAB — COMPREHENSIVE METABOLIC PANEL
ALT: 13 U/L (ref 0–44)
AST: 11 U/L — ABNORMAL LOW (ref 15–41)
Albumin: 3.2 g/dL — ABNORMAL LOW (ref 3.5–5.0)
Alkaline Phosphatase: 69 U/L (ref 38–126)
Anion gap: 15 (ref 5–15)
BUN: 48 mg/dL — ABNORMAL HIGH (ref 6–20)
CO2: 26 mmol/L (ref 22–32)
Calcium: 8.3 mg/dL — ABNORMAL LOW (ref 8.9–10.3)
Chloride: 91 mmol/L — ABNORMAL LOW (ref 98–111)
Creatinine, Ser: 7.42 mg/dL — ABNORMAL HIGH (ref 0.61–1.24)
GFR calc Af Amer: 9 mL/min — ABNORMAL LOW (ref >60)
GFR calc non Af Amer: 8 mL/min — ABNORMAL LOW (ref >60)
Glucose, Bld: 344 mg/dL — ABNORMAL HIGH (ref 70–99)
Potassium: 5 mmol/L (ref 3.5–5.1)
Sodium: 132 mmol/L — ABNORMAL LOW (ref 135–145)
Total Bilirubin: 0.5 mg/dL (ref 0.3–1.2)
Total Protein: 6.2 g/dL — ABNORMAL LOW (ref 6.5–8.1)

## 2019-07-29 LAB — HEPATITIS B CORE ANTIBODY, TOTAL: Hep B Core Total Ab: NONREACTIVE

## 2019-07-29 LAB — CBC WITH DIFFERENTIAL/PLATELET
Abs Immature Granulocytes: 0.11 10*3/uL — ABNORMAL HIGH (ref 0.00–0.07)
Basophils Absolute: 0 10*3/uL (ref 0.0–0.1)
Basophils Relative: 0 %
Eosinophils Absolute: 0 10*3/uL (ref 0.0–0.5)
Eosinophils Relative: 0 %
HCT: 35.7 % — ABNORMAL LOW (ref 39.0–52.0)
Hemoglobin: 11.3 g/dL — ABNORMAL LOW (ref 13.0–17.0)
Immature Granulocytes: 2 %
Lymphocytes Relative: 11 %
Lymphs Abs: 0.6 10*3/uL — ABNORMAL LOW (ref 0.7–4.0)
MCH: 31 pg (ref 26.0–34.0)
MCHC: 31.7 g/dL (ref 30.0–36.0)
MCV: 97.8 fL (ref 80.0–100.0)
Monocytes Absolute: 0.4 10*3/uL (ref 0.1–1.0)
Monocytes Relative: 6 %
Neutro Abs: 4.6 10*3/uL (ref 1.7–7.7)
Neutrophils Relative %: 81 %
Platelets: 178 10*3/uL (ref 150–400)
RBC: 3.65 MIL/uL — ABNORMAL LOW (ref 4.22–5.81)
RDW: 15.9 % — ABNORMAL HIGH (ref 11.5–15.5)
WBC: 5.7 10*3/uL (ref 4.0–10.5)
nRBC: 0.9 % — ABNORMAL HIGH (ref 0.0–0.2)

## 2019-07-29 LAB — MAGNESIUM: Magnesium: 2.4 mg/dL (ref 1.7–2.4)

## 2019-07-29 LAB — GLUCOSE, CAPILLARY
Glucose-Capillary: 187 mg/dL — ABNORMAL HIGH (ref 70–99)
Glucose-Capillary: 289 mg/dL — ABNORMAL HIGH (ref 70–99)

## 2019-07-29 LAB — HEPARIN LEVEL (UNFRACTIONATED): Heparin Unfractionated: 0.78 IU/mL — ABNORMAL HIGH (ref 0.30–0.70)

## 2019-07-29 LAB — FIBRIN DERIVATIVES D-DIMER (ARMC ONLY): Fibrin derivatives D-dimer (ARMC): 450.68 ng/mL (FEU) (ref 0.00–499.00)

## 2019-07-29 LAB — C-REACTIVE PROTEIN: CRP: 1.2 mg/dL — ABNORMAL HIGH (ref <1.0)

## 2019-07-29 MED ORDER — AMLODIPINE BESYLATE 5 MG PO TABS: ORAL_TABLET | ORAL | 1 refills | Status: DC

## 2019-07-29 NOTE — Progress Notes (Signed)
Pt being discharged home, discharge instructions reviewed with pt, states understanding, pt with no complaints 

## 2019-07-29 NOTE — Progress Notes (Signed)
Outpatient hemodialysis has been set up for the patient at a [facility] unit in [place].  Okay for discharge from a renal perspective.

## 2019-07-29 NOTE — Progress Notes (Signed)
Hemodialysis patient known at [facility], is COVID positive and will be treating at the [place] clinic shift MWF 5:00pm. Patient is aware and states that he will be able to drive himself to treatments. Clinic is ready for patient to start on Friday at 5:00pm. Please contact me with questions or concerns of this plan.  Elvera Bicker Dialysis Coordinator [phone]

## 2019-07-29 NOTE — Progress Notes (Signed)
On discussion today, he reports he feels well and feels good enough to go home Denies any shortness of breath chest pain, reports his cough has resolved Has ambulated around the room without significant symptoms  Discussed with him yesterday and today, will went through his medications Also discussed timing of cardiac catheterization Catheterization will be delayed after Covid infection has resolved We will set up follow-up in the office several weeks time He will stay on aspirin Plavix statin carvedilol  Hold amlodipine and ACE inhibitor Reports these medications dropped his blood pressure low and made hemodialysis more challenging  Signed, Esmond Plants, MD, Ph.D [facility]

## 2019-07-29 NOTE — Consult Note (Signed)
ANTICOAGULATION CONSULT NOTE - Follow Up Consult  Pharmacy Consult for Heparin Infusion  Indication:ACS/STEMI  Allergies  Allergen Reactions  . Penicillins Rash    Has patient had a PCN reaction causing immediate rash, facial/tongue/throat swelling, SOB or lightheadedness with hypotension: Yes Has patient had a PCN reaction causing severe rash involving mucus membranes or skin necrosis: Yes Has patient had a PCN reaction that required hospitalization: No Has patient had a PCN reaction occurring within the last 10 years: No If all of the above answers are "NO", then may proceed with Cephalosporin use.    Patient Measurements: Height: 6' (182.9 cm) Weight: 245 lb 6 oz (111.3 kg) IBW/kg (Calculated) : 77.6  Vital Signs: Temp: 97.7 F (36.5 C) (01/07 0734) Temp Source: Oral (01/07 0734) BP: 118/84 (01/07 0900) Pulse Rate: 78 (01/07 0734)  Labs: Recent Labs    07/26/19 1959 07/27/19 0329 07/28/19 0230 07/28/19 1015 07/28/19 1215 07/28/19 2128 07/29/19 0903  HGB 10.8*  --  10.7*  --   --   --  11.3*  HCT 34.8*  --  33.2*  --   --   --  35.7*  PLT 153  --  159  --   --   --  178  APTT  --   --   --  33  --   --   --   LABPROT  --   --   --  14.7  --   --   --   INR  --   --   --  1.2  --   --   --   HEPARINUNFRC  --   --   --  <0.10*  --  0.77* 0.78*  CREATININE 4.89*  --  7.89*  --   --   --   --   TROPONINIHS  --  1,143*  --  1,574* 1,489*  --   --    Estimated Creatinine Clearance: 14.3 mL/min (A) (by C-G formula based on SCr of 7.89 mg/dL (H)).  Assessment: Pharmacy consulted for heparin infusion dosing and monitoring for 54 yo male for ACS. Patient has a PMH of ESRD and is COVID (+). Patient received enoxaparin 30mg  x 1 dose 1/5 @ 0418.   1/5 Troponin HS: 1143 1/6 Troponin HS: pending   1/6 Heparin Infusion started @ 1200 units/hr 0106 @ 2128 HL = 0.77, supratherapeutic.  Goal of Therapy:  Heparin level 0.3-0.7 units/ml Monitor platelets by anticoagulation  protocol: Yes   Plan:  0107  @ 0903 HL = 0.78, Level is supratherapeutic.  Will decrease infusion to 950 units/hr and recheck HL in 6 hrs CBC daily per protocol  Pernell Dupre, PharmD, BCPS Clinical Pharmacist 07/29/2019 9:39 AM

## 2019-07-29 NOTE — Discharge Summary (Signed)
Physician Discharge Summary   Ralph Peterson  male DOB: 1966/01/03  [id number]  PCP: Vidal Schwalbe, MD  Admit date: 07/26/2019 Discharge date: 07/29/2019  Admitted From: home Disposition:  home CODE STATUS: Full code  Discharge Instructions    Diet - low sodium heart healthy   Complete by: As directed    Discharge instructions   Complete by: As directed    Since you have no supplemental oxygen requirement, you can go home to recover from [place].  You will need to follow up with Cardiology as outpatient and likely need another heart cath.  The cardiologist who saw you in the hospital is Gollan, Kathlene November, MD.  I have included his contact info in your discharge paper.  Dr. Enzo Bi -   Increase activity slowly   Complete by: As directed        Hospital Course:  For full details, please see H&P, progress notes, consult notes and ancillary notes.  Briefly,  Mr. Ralph Peterson is a 54 y.o. Caucasian M with ESRD on HD MWF, HTN, DM, and CAD who presented with few days progressive dyspnea and edema.  COVID-19 pneumonitis with hypoxic respiratory failure In the ER, fever, SpO2 79%, RR up to 40, needed 3L O2 initially.  COVID+, CXR showed faint bilateral opacities.  Pt was quickly weaned down to RA, so was discharged after 3 days of Remdesivir and Decadron.   NSTEMI Hx of CAD Hx of stent to distal RCA in 2012 Patient noted to have elevated trop, peaked at 1574.  ECG showed RBBB, no ischemic changes relative to Feb 2020 ECG.  Echocardiogram results reviewed showing inferior wall hypokinesis, moderate to severely depressed ejection fraction 25 to 30%.  Cardiology was consulted and deemed the mild trop elevation "not a primary ischemic event and likely demand ischemia in the setting of Covid and cough."  Pt received heparin gtt for 1 day.  Pt was continued on home Plavix, Coreg, atorvastatin, aspirin, lisinopril.  Pt will follow up with his own outpatient cardiologist for possible  heart cath.  Systolic CHF with EF [id number] This was a new dx, but may have developed in the past several years.  Per cards, pt appeared "relatively euvolemic on exam though on echocardiogram has moderately elevated right heart pressures."  Recommend dialysis to push for lower dry weight.  Hypertension BP low normal.  Home amlodipine held, and home coreg and Lisinopril continued.  Diabetes not on home insulin Hyperglycemia due to steroid use A1c 8.5.  Pt received Lantus 10u daily and sliding scale corrections while inpatient for hyperglycemia likely due to steroid use.  Home glipizide held during hospitalization and resumed after discharge.    ESRD on HD MWF Continued sevelamer.  Pt received iHD per nephrology  Mood disorder Continued escitalopram   Discharge Diagnoses:  Active Problems:   CAD (coronary artery disease)   Diabetes mellitus ([place])   Dialysis patient ([place])   ESRD (end stage renal disease) ([place])   Hyperlipidemia, unspecified   Hypertension   COVID-19   Shortness of breath   Elevated troponin    Discharge Instructions:  Allergies as of 07/29/2019      Reactions   Penicillins Rash   Has patient had a PCN reaction causing immediate rash, facial/tongue/throat swelling, SOB or lightheadedness with hypotension: Yes Has patient had a PCN reaction causing severe rash involving mucus membranes or skin necrosis: Yes Has patient had a PCN reaction that required hospitalization: No Has patient had a PCN reaction occurring within  the last 10 years: No If all of the above answers are "NO", then may proceed with Cephalosporin use.      Medication List    TAKE these medications   acetaminophen 500 MG tablet Commonly known as: TYLENOL Take 1,000 mg by mouth 2 (two) times daily as needed for moderate pain.   amLODipine 5 MG tablet Commonly known as: NORVASC Hold this medication because your blood pressure has been low. What changed:   how much to take  how to  take this  when to take this  additional instructions   aspirin EC 81 MG tablet Take 81 mg by mouth every evening.   atorvastatin 40 MG tablet Commonly known as: LIPITOR Take 40 mg by mouth every evening.   carvedilol 25 MG tablet Commonly known as: COREG Take 25 mg by mouth 2 (two) times daily with a meal.   cetirizine 10 MG tablet Commonly known as: ZYRTEC Take 10 mg by mouth every evening.   clopidogrel 75 MG tablet Commonly known as: PLAVIX TAKE 1 TABLET BY MOUTH ONCE DAILY.   diphenhydrAMINE 25 MG tablet Commonly known as: BENADRYL Take 25 mg by mouth at bedtime as needed for allergies.   escitalopram 10 MG tablet Commonly known as: LEXAPRO Take 10 mg by mouth daily.   fluticasone 50 MCG/ACT nasal spray Commonly known as: FLONASE Place 2 sprays into both nostrils every evening.   glipiZIDE 5 MG tablet Commonly known as: GLUCOTROL Take 5 mg by mouth daily before breakfast.   lisinopril 2.5 MG tablet Commonly known as: ZESTRIL Take 2.5 mg by mouth every evening.   sevelamer carbonate 800 MG tablet Commonly known as: RENVELA Take 1,600-3,200 mg by mouth See admin instructions. Take 3200 mg with meals / 1600 mg with snacks       Follow-up Information    Vidal Schwalbe, MD Follow up in 1 week(s).   Specialty: Family Medicine Contact information: [address] [place] [address] [phone]        Wellington Hampshire, MD .   Specialty: Cardiology Contact information: [place] [address] [phone]        Minna Merritts, MD Follow up in 2 week(s).   Specialty: Cardiology Why: Likely need a heart cath. Contact information: [address] [place] [address] [phone]           Allergies  Allergen Reactions  . Penicillins Rash    Has patient had a PCN reaction causing immediate rash, facial/tongue/throat swelling, SOB or lightheadedness with hypotension: Yes Has patient  had a PCN reaction causing severe rash involving mucus membranes or skin necrosis: Yes Has patient had a PCN reaction that required hospitalization: No Has patient had a PCN reaction occurring within the last 10 years: No If all of the above answers are "NO", then may proceed with Cephalosporin use.      The results of significant diagnostics from this hospitalization (including imaging, microbiology, ancillary and laboratory) are listed below for reference.   Consultations:   Procedures/Studies: DG Chest Port 1 View  Result Date: 07/26/2019 CLINICAL DATA:  Leg swelling for 2 weeks with shortness of breath EXAM: PORTABLE CHEST 1 VIEW COMPARISON:  12/01/2007 FINDINGS: Cardiac shadow is mildly prominent but accentuated by the portable technique. Elevation the right hemidiaphragm is seen. Mild vascular congestion is noted without focal infiltrate or parenchymal edema. No acute bony abnormality is seen. IMPRESSION: Mild vascular congestion. Electronically Signed  By: Inez Catalina M.D.   On: 07/26/2019 21:25   ECHOCARDIOGRAM COMPLETE  Result Date: 07/27/2019   ECHOCARDIOGRAM REPORT   Patient Name:   Ralph Peterson Date of Exam: 07/27/2019 Medical Rec #:  [id number]            Height:       72.0 in Accession #:    [id number]           Weight:       246.9 lb Date of Birth:  06/14/1966            BSA:          2.33 m Patient Age:    21 years             BP:           109/67 mmHg Patient Gender: M                    HR:           76 bpm. Exam Location:  [facility] Procedure: 2D Echo, Cardiac Doppler and Color Doppler Indications:     Dyspnea 786.09  History:         Patient has no prior history of Echocardiogram examinations.                  CAD; Risk Factors:Hypertension and Diabetes.  Sonographer:     Sherrie Sport RDCS (AE) Referring Phys:  [id number] Nicolette Bang Diagnosing Phys: Yolonda Kida MD IMPRESSIONS  1. Left ventricular ejection fraction, by visual estimation, is 25 to 30%. The left  ventricle has moderate to severely decreased function. Left ventricular septal wall thickness was normal. Normal left ventricular posterior wall thickness. There is no left ventricular hypertrophy.  2. Left ventricular diastolic parameters are consistent with Grade I diastolic dysfunction (impaired relaxation).  3. 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  4. Moderately dilated left ventricular internal cavity size.  5. The left ventricle demonstrates regional wall motion abnormalities.  6. Global right ventricle has low normal systolic function.The right ventricular size is normal. Mildly increased right ventricular wall thickness.  7. Left atrial size was normal.  8. Right atrial size was mildly dilated.  9. The mitral valve is normal in structure. Trivial mitral valve regurgitation. 10. The tricuspid valve is normal in structure. 11. The aortic valve is grossly normal. Aortic valve regurgitation is not visualized. Mild aortic valve sclerosis without stenosis. 12. The pulmonic valve was grossly normal. Pulmonic valve regurgitation is not visualized. 13. Moderately elevated pulmonary artery systolic pressure. FINDINGS  Left Ventricle: Left ventricular ejection fraction, by visual estimation, is 25 to 30%. The left ventricle has moderate to severely decreased function. The left ventricle demonstrates regional wall motion abnormalities. The left ventricular internal cavity size was moderately dilated left ventricle. Normal left ventricular posterior wall thickness. There is no left ventricular hypertrophy. Left ventricular diastolic parameters are consistent with Grade I diastolic dysfunction (impaired relaxation). There is evidence of a moderate to large inferior and posterior myocardial infarction. Right Ventricle: The right ventricular size is normal. Mildly increased right ventricular wall thickness. Global RV systolic function is has low normal systolic function. The tricuspid regurgitant  velocity is 3.19 m/s, and with an assumed right atrial pressure of 10 mmHg, the estimated right ventricular systolic pressure is moderately elevated at 50.7 mmHg. Left Atrium: Left atrial size was normal in size. Right Atrium: Right atrial size was mildly dilated Pericardium: There is  no evidence of pericardial effusion. Mitral Valve: The mitral valve is normal in structure. Trivial mitral valve regurgitation. Tricuspid Valve: The tricuspid valve is normal in structure. Tricuspid valve regurgitation is mild. Aortic Valve: The aortic valve is grossly normal. Aortic valve regurgitation is not visualized. Mild aortic valve sclerosis is present, with no evidence of aortic valve stenosis. Pulmonic Valve: The pulmonic valve was grossly normal. Pulmonic valve regurgitation is not visualized. Pulmonic regurgitation is not visualized. Aorta: The aortic root is normal in size and structure. IAS/Shunts: No atrial level shunt detected by color flow Doppler.  LEFT VENTRICLE PLAX 2D LVIDd:         6.15 cm LVIDs:         5.37 cm LV PW:         1.19 cm LV IVS:        1.53 cm LVOT diam:     2.10 cm LV SV:         51 ml LV SV Index:   21.05 LVOT Area:     3.46 cm  LEFT ATRIUM         Index LA diam:    4.60 cm 1.97 cm/m                        PULMONIC VALVE AORTA                 PV Vmax:        0.52 m/s Ao Root diam: 3.00 cm PV Peak grad:   1.1 mmHg                       RVOT Peak grad: 2 mmHg  TRICUSPID VALVE TR Peak grad:   40.7 mmHg TR Vmax:        319.00 cm/s  SHUNTS Systemic Diam: 2.10 cm  Yolonda Kida MD Electronically signed by Yolonda Kida MD Signature Date/Time: 07/27/2019/11:51:06 AM    Final       Labs: BNP (last 3 results) Recent Labs    07/26/19 1959  BNP [id number]*   Basic Metabolic Panel: Recent Labs  Lab 07/26/19 1959 07/28/19 0230 07/28/19 2128 07/29/19 0903  NA 134* 130*  --  132*  K 3.1* 4.5  --  5.0  CL 92* 86*  --  91*  CO2 28 29  --  26  GLUCOSE 100* 238*  --  344*  BUN 14 44*  --   48*  CREATININE 4.89* 7.89*  --  7.42*  CALCIUM 8.3* 8.2*  --  8.3*  MG  --  2.3  --  2.4  PHOS  --   --  2.7  --    Liver Function Tests: Recent Labs  Lab 07/28/19 0230 07/29/19 0903  AST 17 11*  ALT 16 13  ALKPHOS 70 69  BILITOT 0.6 0.5  PROT 6.1* 6.2*  ALBUMIN 3.1* 3.2*   No results for input(s): LIPASE, AMYLASE in the last 168 hours. No results for input(s): AMMONIA in the last 168 hours. CBC: Recent Labs  Lab 07/26/19 1959 07/28/19 0230 07/29/19 0903  WBC 3.4* 3.6* 5.7  NEUTROABS  --  2.3 4.6  HGB 10.8* 10.7* 11.3*  HCT 34.8* 33.2* 35.7*  MCV 98.3 97.1 97.8  PLT 153 159 178   Cardiac Enzymes: No results for input(s): CKTOTAL, CKMB, CKMBINDEX, TROPONINI in the last 168 hours. BNP: Invalid input(s): POCBNP CBG: Recent Labs  Lab 07/28/19 0809 07/28/19 1138  07/28/19 1709 07/28/19 2124 07/29/19 0822  GLUCAP 274* 307* 278* 187* 289*   D-Dimer No results for input(s): DDIMER in the last 72 hours. Hgb A1c No results for input(s): HGBA1C in the last 72 hours. Lipid Profile No results for input(s): CHOL, HDL, LDLCALC, TRIG, CHOLHDL, LDLDIRECT in the last 72 hours. Thyroid function studies No results for input(s): TSH, T4TOTAL, T3FREE, THYROIDAB in the last 72 hours.  Invalid input(s): FREET3 Anemia work up No results for input(s): VITAMINB12, FOLATE, FERRITIN, TIBC, IRON, RETICCTPCT in the last 72 hours. Urinalysis No results found for: COLORURINE, APPEARANCEUR, [place], [place], GLUCOSEU, [place], [place], KETONESUR, [place], UROBILINOGEN, NITRITE, LEUKOCYTESUR Sepsis Labs Invalid input(s): PROCALCITONIN,  WBC,  LACTICIDVEN Microbiology Recent Results (from the past 240 hour(s))  Culture, blood (routine x 2)     Status: None   Collection Time: 07/26/19 10:40 PM   Specimen: BLOOD  Result Value Ref Range Status   Specimen Description BLOOD BLOOD LEFT WRIST  Final   Special Requests   Final    BOTTLES DRAWN AEROBIC AND ANAEROBIC Blood Culture  adequate volume   Culture   Final    NO GROWTH 5 DAYS Performed at [facility], [address] [place], [place] [address]    Report Status 07/31/2019 FINAL  Final  Culture, blood (routine x 2)     Status: None   Collection Time: 07/26/19 10:40 PM   Specimen: BLOOD  Result Value Ref Range Status   Specimen Description BLOOD BLOOD LEFT HAND  Final   Special Requests   Final    BOTTLES DRAWN AEROBIC AND ANAEROBIC Blood Culture adequate volume   Culture   Final    NO GROWTH 5 DAYS Performed at [facility], [address], [place], [place] [address]    Report Status 07/31/2019 FINAL  Final     Total time spend on discharging this patient, including the last patient exam, discussing the hospital stay, instructions for ongoing care as it relates to all pertinent caregivers, as well as preparing the medical discharge records, prescriptions, and/or referrals as applicable, is 30 minutes.    Enzo Bi, MD  [facility] 08/01/2019, 4:48 PM  If 7PM-7AM, please contact night-coverage

## 2019-07-29 NOTE — TOC Transition Note (Signed)
Transition of Care [facility]) - CM/SW Discharge Note   Patient Details  Name: Ralph Peterson MRN: [id number] Date of Birth: 07-02-1966  [facility] CM/SW Contact:  Shelbie Hutching, RN Phone Number: 07/29/2019, 10:44 AM   Clinical Narrative:    Patient will discharge home today.  Elvera Bicker with [employer] has arranged for patient to get dialysis tomorrow at a clinic that is accepting COVID + patient's.  Estill Bamberg will call the patient with the details.    Final next level of care: Home/Self Care Barriers to Discharge: Barriers Resolved   Patient Goals and CMS Choice Patient states their goals for this hospitalization and ongoing recovery are:: to feel better      Discharge Placement                       Discharge Plan and Services   Discharge Planning Services: CM Consult                                 Social Determinants of Health (SDOH) Interventions     Readmission Risk Interventions No flowsheet data found.

## 2019-07-29 NOTE — Progress Notes (Signed)
Pts home meds returned

## 2019-07-30 LAB — PARATHYROID HORMONE, INTACT (NO CA): PTH: 246 pg/mL — ABNORMAL HIGH (ref 15–65)

## 2019-07-31 LAB — CULTURE, BLOOD (ROUTINE X 2)
Culture: NO GROWTH
Culture: NO GROWTH
Special Requests: ADEQUATE
Special Requests: ADEQUATE

## 2019-08-04 ENCOUNTER — Telehealth: Payer: Self-pay | Admitting: Cardiovascular Disease

## 2019-08-04 NOTE — Telephone Encounter (Signed)
LMOV to schedule follow up 

## 2019-08-04 NOTE — Telephone Encounter (Signed)
-----   Message from Minna Merritts, MD sent at 07/29/2019 11:14 AM EST ----- Can we call for TCM on Friday or Monday He is leaving the hospital Thursday, July 29, 2019 Needs to be scheduled in clinic in 3 weeks time, getting over Covid Can see APP or Gollan Thx TG

## 2019-08-19 DIAGNOSIS — I5023 Acute on chronic systolic (congestive) heart failure: Secondary | ICD-10-CM | POA: Insufficient documentation

## 2019-09-03 NOTE — Telephone Encounter (Signed)
Attempted to schedule.  LMOV to call office.  ° °

## 2019-09-09 NOTE — Telephone Encounter (Signed)
3 attempts to schedule fu appt from recall list/ tcm msg .   Deleting recall.

## 2019-10-19 ENCOUNTER — Other Ambulatory Visit: Payer: Self-pay | Admitting: Cardiovascular Disease

## 2019-10-20 ENCOUNTER — Other Ambulatory Visit: Payer: Self-pay | Admitting: *Deleted

## 2019-10-20 ENCOUNTER — Other Ambulatory Visit: Payer: Self-pay | Admitting: Cardiovascular Disease

## 2019-10-20 ENCOUNTER — Telehealth: Payer: Self-pay | Admitting: *Deleted

## 2019-10-20 NOTE — Telephone Encounter (Signed)
[place] to speak to pharmacist concerning refill sent in for Clopidogrel 75 mg qd on 10/20/2019.  Pt no longer under Gollan care he is currently seeing [facility]. Pharmacy will cancel our refill and reach out to pt's current Cardiologist.

## 2019-12-13 ENCOUNTER — Telehealth (INDEPENDENT_AMBULATORY_CARE_PROVIDER_SITE_OTHER): Payer: Self-pay

## 2019-12-13 NOTE — Telephone Encounter (Signed)
I returned the pts call from the nurses line. The pt made me aware he is having pain in his Lower right leg that is aching. The pt has a Hx of bad blood flow an has loss several toes do to this. The pt has no history of blood clots,wounds, skin color changes or any numbness.

## 2019-12-13 NOTE — Telephone Encounter (Signed)
Bring him in with ABIs.Marland Kitchen me or dew

## 2019-12-14 NOTE — Telephone Encounter (Signed)
Please schedule per the NP's instructions.

## 2019-12-17 ENCOUNTER — Other Ambulatory Visit (INDEPENDENT_AMBULATORY_CARE_PROVIDER_SITE_OTHER): Payer: Self-pay | Admitting: Nurse Practitioner

## 2019-12-17 DIAGNOSIS — M79604 Pain in right leg: Secondary | ICD-10-CM

## 2019-12-17 DIAGNOSIS — Z95828 Presence of other vascular implants and grafts: Secondary | ICD-10-CM

## 2019-12-21 ENCOUNTER — Ambulatory Visit (INDEPENDENT_AMBULATORY_CARE_PROVIDER_SITE_OTHER): Payer: Medicare PPO | Admitting: Nurse Practitioner

## 2019-12-21 ENCOUNTER — Encounter (INDEPENDENT_AMBULATORY_CARE_PROVIDER_SITE_OTHER): Payer: Medicare PPO

## 2020-01-06 ENCOUNTER — Encounter (HOSPITAL_BASED_OUTPATIENT_CLINIC_OR_DEPARTMENT_OTHER): Payer: Self-pay

## 2020-01-06 ENCOUNTER — Other Ambulatory Visit (HOSPITAL_BASED_OUTPATIENT_CLINIC_OR_DEPARTMENT_OTHER): Payer: Self-pay

## 2020-01-06 DIAGNOSIS — G4710 Hypersomnia, unspecified: Secondary | ICD-10-CM

## 2020-01-06 DIAGNOSIS — R5383 Other fatigue: Secondary | ICD-10-CM

## 2020-01-06 DIAGNOSIS — R0683 Snoring: Secondary | ICD-10-CM

## 2020-01-11 ENCOUNTER — Ambulatory Visit: Payer: Medicare PPO | Admitting: Podiatry

## 2020-01-11 ENCOUNTER — Other Ambulatory Visit: Payer: Self-pay | Admitting: Podiatry

## 2020-01-11 ENCOUNTER — Other Ambulatory Visit: Payer: Self-pay

## 2020-01-11 ENCOUNTER — Encounter: Payer: Self-pay | Admitting: Podiatry

## 2020-01-11 ENCOUNTER — Ambulatory Visit (INDEPENDENT_AMBULATORY_CARE_PROVIDER_SITE_OTHER): Payer: Medicare PPO

## 2020-01-11 DIAGNOSIS — E0842 Diabetes mellitus due to underlying condition with diabetic polyneuropathy: Secondary | ICD-10-CM

## 2020-01-11 DIAGNOSIS — L97515 Non-pressure chronic ulcer of other part of right foot with muscle involvement without evidence of necrosis: Secondary | ICD-10-CM | POA: Diagnosis not present

## 2020-01-11 DIAGNOSIS — L97512 Non-pressure chronic ulcer of other part of right foot with fat layer exposed: Secondary | ICD-10-CM

## 2020-01-11 DIAGNOSIS — I999 Unspecified disorder of circulatory system: Secondary | ICD-10-CM | POA: Diagnosis not present

## 2020-01-11 MED ORDER — DOXYCYCLINE HYCLATE 100 MG PO TABS: 100.0000 mg | ORAL_TABLET | Freq: Two times a day (BID) | ORAL | 0 refills | Status: DC

## 2020-01-11 NOTE — Progress Notes (Signed)
Subjective:  Patient ID: PIOTR Abass, male    DOB: 07-Mar-1966,  MRN: [id number]  Chief Complaint  Patient presents with  . Foot Ulcer    pt is here for a possible ulcer of the right plantar forefoot, going on since last thursday, pt is concerned about possible infection    54 y.o. male presents for wound care.  Patient presents with right submetatarsal 2 ulceration with probing down to deep tissue with muscle necrosis.  Patient states this has been going on for about 2 weeks.  Patient is a type II diabetic with last A1c of around 7%.  Patient states that he is getting vascular study done on July 2 there is a limited flow to the right lower extremity that is getting evaluated for.  Patient has been keeping it dry however there and has progressive gotten worse.  Patient states that they are sore at the end of the day.  Patient has history of multiple amputations that were done by Dr. Amalia Hailey including left transmetatarsal amputation.  He denies any other acute complaints.  He would like to treat this ulceration before progressed to an amputation.  He denies any other acute complaints   Review of Systems: Negative except as noted in the HPI. Denies N/V/F/Ch.  Past Medical History:  Diagnosis Date  . CAD (coronary artery disease)    a. 09/2010 Cath/PCI ([facility]): LM nl, LAD 28m, D1 80 (small), LCX 60m, OM1 30, RI 70 (small), RCA 70 (DES).  . Coronary artery disease   . COVID-19 virus infection 07/2019  . Diabetes mellitus without complication ([place])   . ESRD (end stage renal disease) ([place])   . Hyperlipidemia   . Hypertension   . Ischemic cardiomyopathy    a.  12/2011 Echo ([facility]): NL EF, mod LVH. Mild AS/MS, triv PR/TR. 07/2019 Echo: EF 25-30%, GR1 DD, inf/post HK, low nl RV fxn, mildly dil RA, triv MR, mild Ao sclerosis w/o stenosis.  . NSTEMI (non-ST elevated myocardial infarction) ([place]) 07/2019  . PAD (peripheral artery disease) ([place])    a. 03/2016 s/p PTA/DEB R SFA/popliteal/peroneal; b.  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 08/2017 Atherectomy/DEB dist L SFA/popliteal. PTA of L AT; d. 06/2018 PTA/DEB L SFA/popliteal/PT/AT; e. 01/2019 Stable ABIs.    Current Outpatient Medications:  .  acetaminophen (TYLENOL) 500 MG tablet, Take 1,000 mg by mouth 2 (two) times daily as needed for moderate pain., Disp: , Rfl:  .  amLODipine (NORVASC) 5 MG tablet, Hold this medication because your blood pressure has been low., Disp: , Rfl: 1 .  aspirin EC 81 MG tablet, Take 81 mg by mouth every evening., Disp: , Rfl:  .  atorvastatin (LIPITOR) 40 MG tablet, Take 40 mg by mouth every evening. , Disp: , Rfl: 1 .  carvedilol (COREG) 25 MG tablet, Take 25 mg by mouth 2 (two) times daily with a meal. , Disp: , Rfl:  .  cetirizine (ZYRTEC) 10 MG tablet, Take 10 mg by mouth every evening. , Disp: , Rfl: 5 .  clopidogrel (PLAVIX) 75 MG tablet, TAKE 1 TABLET BY MOUTH ONCE DAILY., Disp: 15 tablet, Rfl: 0 .  diphenhydrAMINE (BENADRYL) 25 MG tablet, Take 25 mg by mouth at bedtime as needed for allergies., Disp: , Rfl:  .  escitalopram (LEXAPRO) 10 MG tablet, Take 10 mg by mouth daily. , Disp: , Rfl:  .  fluticasone (FLONASE) 50 MCG/ACT nasal spray, Place 2 sprays into both nostrils every evening. , Disp: , Rfl:  .  glipiZIDE (GLUCOTROL) 5 MG tablet, Take 5 mg by mouth daily before breakfast. , Disp: , Rfl:  .  lisinopril (PRINIVIL,ZESTRIL) 2.5 MG tablet, Take 2.5 mg by mouth every evening. , Disp: , Rfl:  .  sevelamer carbonate (RENVELA) 800 MG tablet, Take 1,600-3,200 mg by mouth See admin instructions. Take 3200 mg with meals / 1600 mg with snacks, Disp: , Rfl:  .  doxycycline (VIBRA-TABS) 100 MG tablet, Take 1 tablet (100 mg total) by mouth 2 (two) times daily., Disp: 28 tablet, Rfl: 0  Social History   Tobacco Use  Smoking Status Never Smoker  Smokeless Tobacco Never Used    Allergies  Allergen Reactions  . Penicillins Rash    Has patient had a PCN reaction causing immediate rash,  facial/tongue/throat swelling, SOB or lightheadedness with hypotension: Yes Has patient had a PCN reaction causing severe rash involving mucus membranes or skin necrosis: Yes Has patient had a PCN reaction that required hospitalization: No Has patient had a PCN reaction occurring within the last 10 years: No If all of the above answers are "NO", then may proceed with Cephalosporin use.    Objective:  There were no vitals filed for this visit. There is no height or weight on file to calculate BMI. Constitutional Well developed. Well nourished.  Vascular Dorsalis pedis pulses non palpable bilaterally. Posterior tibial pulsesnon palpable bilaterally. Capillary refill normal to all digits.  No cyanosis or clubbing noted. Pedal hair growth normal.  Neurologic Normal speech. Oriented to person, place, and time. Protective sensation absent  Dermatologic Wound Location: Right submetatarsal 2 ulcer with probing down to deep tissue/muscle.  Does not probe down to bone.  No clinical signs of infection noted.  No purulent drainage was expressed.  No malodor present. Wound Base: Mixed Granular/Fibrotic Peri-wound: Calloused Exudate: Scant/small amount Serosanguinous exudate Wound Measurements: -See below  Orthopedic: No pain to palpation either foot.   Radiographs: 2 views of skeletally mature adult right foot: No osteomyelitic changes noted.  No other signs of cortical destruction of the metatarsal head noted.  No soft tissue emphysema noted.  Third digit amputation noted.  Assessment:   1. Diabetes mellitus due to underlying condition with diabetic polyneuropathy, unspecified whether long term insulin use (HCC)   2. Vascular abnormality   3. Ulcer of right foot with muscle involvement without evidence of necrosis [facility])    Plan:  Patient was evaluated and treated and all questions answered.  Ulcer right submetatarsal 2 ulceration with probing down to deep  tissue/capsule/muscle -Debridement as below. -Dressed with Betadine wet-to-dry, DSD. -Continue off-loading with surgical shoe. -Surgical shoe was dispensed -I discussed with the patient that given that the location of the ulceration patient a high risk of losing that digit as well as then further amputation.  I am worried that patient may end up losing her metatarsal head if the wound continues to progress the way it is.  Patient will continue to offload her right away. -I will await the vascular input on his circulation to the right lower extremity.  And if poor we will plan on following up with vascular work-up. -Doxycycline was dispensed for skin and soft tissue prophylaxis  Procedure: Excisional Debridement of Wound Tool: Sharp chisel blade/tissue nipper Rationale: Removal of non-viable soft tissue from the wound to promote healing.  Anesthesia: none Pre-Debridement Wound Measurements: 1.5 cm x 1.4 cm x 0.6 cm  Post-Debridement Wound Measurements: 1.7 cm x 1.4 cm x 0.6 cm  Type of Debridement: Sharp Excisional Tissue Removed:  Non-viable soft tissue Blood loss: Minimal (<50cc) Depth of Debridement: subcutaneous tissue. Technique: Sharp excisional debridement to bleeding, viable wound base.  Wound Progress: This is my initial evaluation.  I will continue to monitor the wound. Site healing conversation 7 Dressing: Dry, sterile, compression dressing. Disposition: Patient tolerated procedure well. Patient to return in 1 week for follow-up.  No follow-ups on file.

## 2020-01-18 ENCOUNTER — Encounter: Payer: Self-pay | Admitting: Podiatry

## 2020-01-18 ENCOUNTER — Other Ambulatory Visit: Payer: Self-pay

## 2020-01-18 ENCOUNTER — Ambulatory Visit: Payer: Medicare PPO | Admitting: Podiatry

## 2020-01-18 DIAGNOSIS — E0842 Diabetes mellitus due to underlying condition with diabetic polyneuropathy: Secondary | ICD-10-CM | POA: Diagnosis not present

## 2020-01-18 DIAGNOSIS — L97515 Non-pressure chronic ulcer of other part of right foot with muscle involvement without evidence of necrosis: Secondary | ICD-10-CM

## 2020-01-18 NOTE — Progress Notes (Signed)
Subjective:  Patient ID: Ralph Peterson, male    DOB: 1965-08-25,  MRN: [id number]  Chief Complaint  Patient presents with  . Foot Pain    pt is here for a f/u of wound care to the right foot. Pt states that the wound is looking the same since his last visit. Pt also states that he was not able to take his doxycycline, due to taking the second COVID vaccine. But was able to take it after he recovered from the injection.    54 y.o. male presents for wound care.  Patient presents with right submetatarsal 2 ulceration follow-up.  Patient states is about the same he has been doing Betadine wet-to-dry dressing changes.  The wound looks about the same he is scheduled for vascular study this Thursday and then a vascular follow-up next week.  He denies any acute complaints.  He has been taking doxycycline.  He has been ambulating with surgical shoe has been keeping the bandages clean dry and intact.   Review of Systems: Negative except as noted in the HPI. Denies N/V/F/Ch.  Past Medical History:  Diagnosis Date  . CAD (coronary artery disease)    a. 09/2010 Cath/PCI ([facility]): LM nl, LAD 35m, D1 80 (small), LCX 64m, OM1 30, RI 70 (small), RCA 70 ([facility]).  . Coronary artery disease   . COVID-19 virus infection 07/2019  . Diabetes mellitus without complication ([place])   . ESRD (end stage renal disease) ([place])   . Hyperlipidemia   . Hypertension   . Ischemic cardiomyopathy    a.  12/2011 Echo ([facility]): NL EF, mod LVH. Mild AS/MS, triv PR/TR. 07/2019 Echo: EF 25-30%, GR1 DD, inf/post HK, low nl RV fxn, mildly dil RA, triv MR, mild Ao sclerosis w/o stenosis.  . NSTEMI (non-ST elevated myocardial infarction) ([place]) 07/2019  . PAD (peripheral artery disease) ([place])    a. 03/2016 s/p PTA/DEB R SFA/popliteal/peroneal; b. 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 08/2017 Atherectomy/DEB dist L SFA/popliteal. PTA of L AT; d. 06/2018 PTA/DEB L SFA/popliteal/PT/AT; e. 01/2019 Stable ABIs.    Current Outpatient  Medications:  .  acetaminophen (TYLENOL) 500 MG tablet, Take 1,000 mg by mouth 2 (two) times daily as needed for moderate pain., Disp: , Rfl:  .  amLODipine (NORVASC) 5 MG tablet, Hold this medication because your blood pressure has been low., Disp: , Rfl: 1 .  aspirin EC 81 MG tablet, Take 81 mg by mouth every evening., Disp: , Rfl:  .  atorvastatin (LIPITOR) 40 MG tablet, Take 40 mg by mouth every evening. , Disp: , Rfl: 1 .  carvedilol (COREG) 25 MG tablet, Take 25 mg by mouth 2 (two) times daily with a meal. , Disp: , Rfl:  .  cetirizine (ZYRTEC) 10 MG tablet, Take 10 mg by mouth every evening. , Disp: , Rfl: 5 .  clopidogrel (PLAVIX) 75 MG tablet, TAKE 1 TABLET BY MOUTH ONCE DAILY., Disp: 15 tablet, Rfl: 0 .  diphenhydrAMINE (BENADRYL) 25 MG tablet, Take 25 mg by mouth at bedtime as needed for allergies., Disp: , Rfl:  .  doxycycline (VIBRA-TABS) 100 MG tablet, Take 1 tablet (100 mg total) by mouth 2 (two) times daily., Disp: 28 tablet, Rfl: 0 .  escitalopram (LEXAPRO) 10 MG tablet, Take 10 mg by mouth daily. , Disp: , Rfl:  .  fluticasone (FLONASE) 50 MCG/ACT nasal spray, Place 2 sprays into both nostrils every evening. , Disp: , Rfl:  .  glipiZIDE (GLUCOTROL) 5 MG tablet, Take  5 mg by mouth daily before breakfast. , Disp: , Rfl:  .  lisinopril (PRINIVIL,ZESTRIL) 2.5 MG tablet, Take 2.5 mg by mouth every evening. , Disp: , Rfl:  .  sevelamer carbonate (RENVELA) 800 MG tablet, Take 1,600-3,200 mg by mouth See admin instructions. Take 3200 mg with meals / 1600 mg with snacks, Disp: , Rfl:   Social History   Tobacco Use  Smoking Status Never Smoker  Smokeless Tobacco Never Used    Allergies  Allergen Reactions  . Penicillins Rash    Has patient had a PCN reaction causing immediate rash, facial/tongue/throat swelling, SOB or lightheadedness with hypotension: Yes Has patient had a PCN reaction causing severe rash involving mucus membranes or skin necrosis: Yes Has patient had a PCN  reaction that required hospitalization: No Has patient had a PCN reaction occurring within the last 10 years: No If all of the above answers are "NO", then may proceed with Cephalosporin use.    Objective:  There were no vitals filed for this visit. There is no height or weight on file to calculate BMI. Constitutional Well developed. Well nourished.  Vascular Dorsalis pedis pulses non palpable bilaterally. Posterior tibial pulsesnon palpable bilaterally. Capillary refill normal to all digits.  No cyanosis or clubbing noted. Pedal hair growth normal.  Neurologic Normal speech. Oriented to person, place, and time. Protective sensation absent  Dermatologic Wound Location: Right submetatarsal 2 ulcer with probing down to deep tissue/muscle.  Does not probe down to bone.  No clinical signs of infection noted.  No purulent drainage was expressed.  No malodor present. Wound Base: Mixed Granular/Fibrotic Peri-wound: Calloused Exudate: Scant/small amount Serosanguinous exudate Wound Measurements: -See below  Orthopedic: No pain to palpation either foot.   Radiographs: 2 views of skeletally mature adult right foot: No osteomyelitic changes noted.  No other signs of cortical destruction of the metatarsal head noted.  No soft tissue emphysema noted.  Third digit amputation noted.  Assessment:   1. Diabetes mellitus due to underlying condition with diabetic polyneuropathy, unspecified whether long term insulin use ([place])   2. Ulcer of right foot with muscle involvement without evidence of necrosis [facility])    Plan:  Patient was evaluated and treated and all questions answered.  Ulcer right submetatarsal 2 ulceration with probing down to deep tissue/capsule/muscle~stagnant -Debridement as below. -Dressed with Betadine wet-to-dry, DSD. -Continue off-loading with surgical shoe. -Surgical shoe was dispensed -I discussed with the patient that given that the location of the ulceration patient a  high risk of losing that digit as well as then further amputation.  I am worried that patient may end up losing her metatarsal head if the wound continues to progress the way it is.  Patient will continue to offload her right away. -I will await the vascular input on his circulation to the right lower extremity.  And if poor we will plan on following up with vascular work-up. -Continue doxycycline until the wound is regressing.  Procedure: Excisional Debridement of Wound Tool: Sharp chisel blade/tissue nipper Rationale: Removal of non-viable soft tissue from the wound to promote healing.  Anesthesia: none Pre-Debridement Wound Measurements: 1.5 cm x 1.4 cm x 0.6 cm  Post-Debridement Wound Measurements: 1.7 cm x 1.4 cm x 0.6 cm  Type of Debridement: Sharp Excisional Tissue Removed: Non-viable soft tissue Blood loss: Minimal (<50cc) Depth of Debridement: subcutaneous tissue. Technique: Sharp excisional debridement to bleeding, viable wound base.  Wound Progress: The wound is staying the same.  It is currently stagnation. Site healing  conversation 7 Dressing: Dry, sterile, compression dressing. Disposition: Patient tolerated procedure well. Patient to return in 1 week for follow-up.  No follow-ups on file.

## 2020-01-20 ENCOUNTER — Other Ambulatory Visit: Payer: Self-pay

## 2020-01-20 ENCOUNTER — Ambulatory Visit (INDEPENDENT_AMBULATORY_CARE_PROVIDER_SITE_OTHER): Payer: Medicare PPO

## 2020-01-20 ENCOUNTER — Ambulatory Visit (INDEPENDENT_AMBULATORY_CARE_PROVIDER_SITE_OTHER): Payer: Medicare PPO | Admitting: Nurse Practitioner

## 2020-01-20 ENCOUNTER — Encounter (INDEPENDENT_AMBULATORY_CARE_PROVIDER_SITE_OTHER): Payer: Self-pay | Admitting: Vascular Surgery

## 2020-01-20 ENCOUNTER — Encounter (INDEPENDENT_AMBULATORY_CARE_PROVIDER_SITE_OTHER): Payer: Self-pay

## 2020-01-20 ENCOUNTER — Ambulatory Visit (INDEPENDENT_AMBULATORY_CARE_PROVIDER_SITE_OTHER): Payer: Medicare PPO | Admitting: Vascular Surgery

## 2020-01-20 VITALS — BP 129/85 | HR 82 | Resp 16 | Wt 244.6 lb

## 2020-01-20 DIAGNOSIS — I70213 Atherosclerosis of native arteries of extremities with intermittent claudication, bilateral legs: Secondary | ICD-10-CM | POA: Diagnosis not present

## 2020-01-20 DIAGNOSIS — I10 Essential (primary) hypertension: Secondary | ICD-10-CM

## 2020-01-20 DIAGNOSIS — I70223 Atherosclerosis of native arteries of extremities with rest pain, bilateral legs: Secondary | ICD-10-CM | POA: Diagnosis not present

## 2020-01-20 DIAGNOSIS — Z992 Dependence on renal dialysis: Secondary | ICD-10-CM

## 2020-01-20 DIAGNOSIS — E1122 Type 2 diabetes mellitus with diabetic chronic kidney disease: Secondary | ICD-10-CM

## 2020-01-20 DIAGNOSIS — N186 End stage renal disease: Secondary | ICD-10-CM

## 2020-01-20 DIAGNOSIS — I25118 Atherosclerotic heart disease of native coronary artery with other forms of angina pectoris: Secondary | ICD-10-CM

## 2020-01-21 ENCOUNTER — Encounter (INDEPENDENT_AMBULATORY_CARE_PROVIDER_SITE_OTHER): Payer: Self-pay | Admitting: Vascular Surgery

## 2020-01-21 NOTE — Progress Notes (Signed)
MRN : [id number]  Ralph Peterson is a 54 y.o. (Sep 25, 1965) male who presents with chief complaint of  Chief Complaint  Patient presents with  . Follow-up    ultrasound follow up  .  History of Present Illness:   The patient returns to the office for followup and review of the noninvasive studies. There has been a significant deterioration in the lower extremity symptoms.  The patient notes interval shortening of their claudication distance and development of mild rest pain symptoms. No new ulcers or wounds have occurred since the last visit.  There have been no significant changes to the patient's overall health care.  The patient denies amaurosis fugax or recent TIA symptoms. There are no recent neurological changes noted. The patient denies history of DVT, PE or superficial thrombophlebitis. The patient denies recent episodes of angina or shortness of breath.   Duplex US of the lower extremity arterial system shows occlusion of the distal SFA  Current Meds  Medication Sig  . acetaminophen (TYLENOL) 500 MG tablet Take 1,000 mg by mouth 2 (two) times daily as needed for moderate pain.  Marland Kitchen amLODipine (NORVASC) 5 MG tablet Hold this medication because your blood pressure has been low.  Marland Kitchen aspirin EC 81 MG tablet Take 81 mg by mouth every evening.  Marland Kitchen atorvastatin (LIPITOR) 40 MG tablet Take 40 mg by mouth every evening.   . carvedilol (COREG) 25 MG tablet Take 25 mg by mouth 2 (two) times daily with a meal.   . cetirizine (ZYRTEC) 10 MG tablet Take 10 mg by mouth every evening.   . clopidogrel (PLAVIX) 75 MG tablet TAKE 1 TABLET BY MOUTH ONCE DAILY.  . diphenhydrAMINE (BENADRYL) 25 MG tablet Take 25 mg by mouth at bedtime as needed for allergies.  Marland Kitchen doxycycline (VIBRA-TABS) 100 MG tablet Take 1 tablet (100 mg total) by mouth 2 (two) times daily.  Marland Kitchen escitalopram (LEXAPRO) 10 MG tablet Take 10 mg by mouth daily.   . fluticasone (FLONASE) 50 MCG/ACT nasal spray Place 2 sprays into  both nostrils every evening.   Marland Kitchen glipiZIDE (GLUCOTROL) 5 MG tablet Take 5 mg by mouth daily before breakfast.   . lisinopril (PRINIVIL,ZESTRIL) 2.5 MG tablet Take 2.5 mg by mouth every evening.   . sevelamer carbonate (RENVELA) 800 MG tablet Take 1,600-3,200 mg by mouth See admin instructions. Take 3200 mg with meals / 1600 mg with snacks    Past Medical History:  Diagnosis Date  . CAD (coronary artery disease)    a. 09/2010 Cath/PCI ([facility]): LM nl, LAD 27m, D1 80 (small), LCX 35m, OM1 30, RI 70 (small), RCA 70 (DES).  . Coronary artery disease   . COVID-19 virus infection 07/2019  . Diabetes mellitus without complication ([place])   . ESRD (end stage renal disease) ([place])   . Hyperlipidemia   . Hypertension   . Ischemic cardiomyopathy    a.  12/2011 Echo ([facility]): NL EF, mod LVH. Mild AS/MS, triv PR/TR. 07/2019 Echo: EF 25-30%, GR1 DD, inf/post HK, low nl RV fxn, mildly dil RA, triv MR, mild Ao sclerosis w/o stenosis.  . NSTEMI (non-ST elevated myocardial infarction) ([place]) 07/2019  . PAD (peripheral artery disease) ([place])    a. 03/2016 s/p PTA/DEB R SFA/popliteal/peroneal; b. 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 08/2017 Atherectomy/DEB dist L SFA/popliteal. PTA of L AT; d. 06/2018 PTA/DEB L SFA/popliteal/PT/AT; e. 01/2019 Stable ABIs.    Past Surgical History:  Procedure Laterality Date  . ABDOMINAL AORTOGRAM N/A 09/10/2017  Procedure: ABDOMINAL AORTOGRAM;  Surgeon: Wellington Hampshire, MD;  Location: [place] [facility];  Service: Cardiovascular;  Laterality: N/A;  . AMPUTATION TOE Bilateral    one toe on right, all five toes on the left  . CARDIAC CATHETERIZATION  2012   [facility]; X1 stent 2.5 x 33 mm xience stent Distal TCA  . LOWER EXTREMITY ANGIOGRAPHY Bilateral 09/10/2017   Procedure: Lower Extremity Angiography;  Surgeon: Wellington Hampshire, MD;  Location: [place] [facility];  Service: Cardiovascular;  Laterality: Bilateral;  . LOWER EXTREMITY ANGIOGRAPHY Left 06/23/2018    Procedure: LOWER EXTREMITY ANGIOGRAPHY;  Surgeon: Katha Cabal, MD;  Location: [place] [facility];  Service: Cardiovascular;  Laterality: Left;  . PERIPHERAL VASCULAR ATHERECTOMY Left 09/10/2017   Procedure: PERIPHERAL VASCULAR ATHERECTOMY;  Surgeon: Wellington Hampshire, MD;  Location: [place] [facility];  Service: Cardiovascular;  Laterality: Left;  SFA/POPLITEAL  . PERIPHERAL VASCULAR BALLOON ANGIOPLASTY Left 09/10/2017   Procedure: PERIPHERAL VASCULAR BALLOON ANGIOPLASTY;  Surgeon: Wellington Hampshire, MD;  Location: [place] [facility];  Service: Cardiovascular;  Laterality: Left;  ANTERIAL TIBIAL  . PERIPHERAL VASCULAR CATHETERIZATION Right 03/26/2016   Procedure: Lower Extremity Angiography;  Surgeon: Katha Cabal, MD;  Location: [place] [facility];  Service: Cardiovascular;  Laterality: Right;  . PERIPHERAL VASCULAR INTERVENTION Left 09/10/2017   Procedure: PERIPHERAL VASCULAR INTERVENTION;  Surgeon: Wellington Hampshire, MD;  Location: [place] [facility];  Service: Cardiovascular;  Laterality: Left;  SFA/POPLITEAL    Social History Social History   Tobacco Use  . Smoking status: Never Smoker  . Smokeless tobacco: Never Used  Vaping Use  . Vaping Use: Never used  Substance Use Topics  . Alcohol use: No  . Drug use: No    Family History Family History  Problem Relation Age of Onset  . Hypertension Mother     Allergies  Allergen Reactions  . Penicillins Rash    Has patient had a PCN reaction causing immediate rash, facial/tongue/throat swelling, SOB or lightheadedness with hypotension: Yes Has patient had a PCN reaction causing severe rash involving mucus membranes or skin necrosis: Yes Has patient had a PCN reaction that required hospitalization: No Has patient had a PCN reaction occurring within the last 10 years: No If all of the above answers are "NO", then may proceed with Cephalosporin use.      REVIEW OF SYSTEMS (Negative unless checked)  Constitutional:  [] Weight loss  [] Fever  [] Chills Cardiac: [] Chest pain   [] Chest pressure   [] Palpitations   [] Shortness of breath when laying flat   [] Shortness of breath with exertion. Vascular:  [x] Pain in legs with walking   [x] Pain in legs at rest  [] History of DVT   [] Phlebitis   [] Swelling in legs   [] Varicose veins   [] Non-healing ulcers Pulmonary:   [] Uses home oxygen   [] Productive cough   [] Hemoptysis   [] Wheeze  [] COPD   [] Asthma Neurologic:  [] Dizziness   [] Seizures   [] History of stroke   [] History of TIA  [] Aphasia   [] Vissual changes   [] Weakness or numbness in arm   [] Weakness or numbness in leg Musculoskeletal:   [] Joint swelling   [] Joint pain   [] Low back pain Hematologic:  [] Easy bruising  [] Easy bleeding   [] Hypercoagulable state   [] Anemic Gastrointestinal:  [] Diarrhea   [] Vomiting  [] Gastroesophageal reflux/heartburn   [] Difficulty swallowing. Genitourinary:  [] Chronic kidney disease   [] Difficult urination  [] Frequent urination   [] Blood in urine Skin:  []   Rashes   [] Ulcers  Psychological:  [] History of anxiety   []  History of major depression.  Physical Examination  Vitals:   01/20/20 1444  BP: 129/85  Pulse: 82  Resp: 16  Weight: 244 lb 9.6 oz (110.9 kg)   Body mass index is 33.17 kg/m. Gen: WD/WN, NAD Head: [place]/AT, No temporalis wasting.  Ear/Nose/Throat: Hearing grossly intact, nares w/o erythema or drainage Eyes: PER, EOMI, sclera nonicteric.  Neck: Supple, no large masses.   Pulmonary:  Good air movement, no audible wheezing bilaterally, no use of accessory muscles.  Cardiac: RRR, no JVD Vascular:  Vessel Right Left  Radial Palpable Palpable  PT Not Palpable Palpable  DP Not Palpable  Palpable  Gastrointestinal: Non-distended. No guarding/no peritoneal signs.  Musculoskeletal: M/S 5/5 throughout.  No deformity or atrophy.  Neurologic: CN 2-12 intact. Symmetrical.  Speech is fluent. Motor exam as listed above. Psychiatric: Judgment intact, Mood & affect appropriate  for pt's clinical situation. Dermatologic: No rashes or ulcers noted.  No changes consistent with cellulitis.   CBC Lab Results  Component Value Date   WBC 5.7 07/29/2019   HGB 11.3 (L) 07/29/2019   HCT 35.7 (L) 07/29/2019   MCV 97.8 07/29/2019   PLT 178 07/29/2019    BMET    Component Value Date/Time   NA 132 (L) 07/29/2019 0903   NA 139 03/19/2016 1423   K 5.0 07/29/2019 0903   CL 91 (L) 07/29/2019 0903   CO2 26 07/29/2019 0903   GLUCOSE 344 (H) 07/29/2019 0903   BUN 48 (H) 07/29/2019 0903   BUN 33 (H) 03/19/2016 1423   CREATININE 7.42 (H) 07/29/2019 0903   CALCIUM 8.3 (L) 07/29/2019 0903   GFRNONAA 8 (L) 07/29/2019 0903   GFRAA 9 (L) 07/29/2019 0903   CrCl cannot be calculated (Patient's most recent lab result is older than the maximum 21 days allowed.).  COAG Lab Results  Component Value Date   INR 1.2 07/28/2019   INR 1.16 09/05/2017    Radiology DG Foot Complete Right  Result Date: 01/11/2020 Please see detailed radiograph report in office note.    Assessment/Plan 1. Atherosclerosis of native artery of both lower extremities with rest pain ([place]) Recommend:  The patient has evidence of severe atherosclerotic changes of both lower extremities with right leg rest pain that is associated with preulcerative changes and impending tissue loss of the right foot.  This represents a limb threatening ischemia and places the patient at the risk for right limb loss.  Patient should undergo angiography of the right  lower extremities with the hope for intervention for limb salvage.  The risks and benefits as well as the alternative therapies was discussed in detail with the patient.  All questions were answered.  Patient agrees to proceed with right leg angiography.  The patient will follow up with me in the office after the procedure.    2. Coronary artery disease of native artery of native heart with stable angina pectoris ([place]) Continue cardiac and  antihypertensive medications as already ordered and reviewed, no changes at this time.  Continue statin as ordered and reviewed, no changes at this time  Nitrates PRN for chest pain   3. Essential hypertension Continue antihypertensive medications as already ordered, these medications have been reviewed and there are no changes at this time.   4. Type 2 diabetes mellitus with chronic kidney disease on chronic dialysis, without long-term current use of insulin ([place]) Continue hypoglycemic medications as already ordered, these medications have  been reviewed and there are no changes at this time.  Hgb A1C to be monitored as already arranged by primary service   5. ESRD (end stage renal disease) ([place]) At the present time the patient has adequate dialysis access.  Continue hemodialysis as ordered without interruption.  Avoid nephrotoxic medications and dehydration.  Further plans per nephrology    Hortencia Pilar, MD  01/21/2020 1:56 PM

## 2020-01-25 ENCOUNTER — Ambulatory Visit: Payer: Medicare PPO | Admitting: Podiatry

## 2020-01-25 ENCOUNTER — Telehealth (INDEPENDENT_AMBULATORY_CARE_PROVIDER_SITE_OTHER): Payer: Self-pay

## 2020-01-25 ENCOUNTER — Other Ambulatory Visit: Payer: Self-pay

## 2020-01-25 ENCOUNTER — Encounter: Payer: Self-pay | Admitting: Podiatry

## 2020-01-25 DIAGNOSIS — L97515 Non-pressure chronic ulcer of other part of right foot with muscle involvement without evidence of necrosis: Secondary | ICD-10-CM | POA: Diagnosis not present

## 2020-01-25 DIAGNOSIS — E0842 Diabetes mellitus due to underlying condition with diabetic polyneuropathy: Secondary | ICD-10-CM | POA: Diagnosis not present

## 2020-01-25 NOTE — Progress Notes (Signed)
Subjective:  Patient ID: Ralph Peterson, male    DOB: [birth year],  MRN: [id number]  Chief Complaint  Patient presents with  . Foot Pain    pt is here for wound care f/u, pt states that he got his vascular study done.     54 y.o. male presents for wound care.  Patient presents with right submetatarsal 2 ulceration follow-up.  Patient states is about the same he has been doing Betadine wet-to-dry dressing changes..  Patient is scheduled for angiogram next week.  I will continue to await the angiogram results.  No clinical signs of infection.  Patient be wearing surgical shoe.   Review of Systems: Negative except as noted in the HPI. Denies N/V/F/Ch.  Past Medical History:  Diagnosis Date  . CAD (coronary artery disease)    a. 09/2010 Cath/PCI ([facility]): LM nl, LAD 48m, D1 80 (small), LCX 60m, OM1 30, RI 70 (small), RCA 70 (DES).  . Coronary artery disease   . COVID-19 virus infection 07/2019  . Diabetes mellitus without complication ([place])   . ESRD (end stage renal disease) ([place])   . Hyperlipidemia   . Hypertension   . Ischemic cardiomyopathy    a.  12/2011 Echo ([facility]): NL EF, mod LVH. Mild AS/MS, triv PR/TR. 07/2019 Echo: EF 25-30%, GR1 DD, inf/post HK, low nl RV fxn, mildly dil RA, triv MR, mild Ao sclerosis w/o stenosis.  . NSTEMI (non-ST elevated myocardial infarction) ([place]) 07/2019  . PAD (peripheral artery disease) ([place])    a. 03/2016 s/p PTA/DEB R SFA/popliteal/peroneal; b. 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 08/2017 Atherectomy/DEB dist L SFA/popliteal. PTA of L AT; d. 06/2018 PTA/DEB L SFA/popliteal/PT/AT; e. 01/2019 Stable ABIs.    Current Outpatient Medications:  .  acetaminophen (TYLENOL) 500 MG tablet, Take 1,000 mg by mouth 2 (two) times daily as needed for moderate pain., Disp: , Rfl:  .  amLODipine (NORVASC) 5 MG tablet, Hold this medication because your blood pressure has been low., Disp: , Rfl: 1 .  aspirin EC 81 MG tablet, Take 81 mg by mouth every evening., Disp: ,  Rfl:  .  atorvastatin (LIPITOR) 40 MG tablet, Take 40 mg by mouth every evening. , Disp: , Rfl: 1 .  carvedilol (COREG) 25 MG tablet, Take 25 mg by mouth 2 (two) times daily with a meal. , Disp: , Rfl:  .  cetirizine (ZYRTEC) 10 MG tablet, Take 10 mg by mouth every evening. , Disp: , Rfl: 5 .  clopidogrel (PLAVIX) 75 MG tablet, TAKE 1 TABLET BY MOUTH ONCE DAILY., Disp: 15 tablet, Rfl: 0 .  diphenhydrAMINE (BENADRYL) 25 MG tablet, Take 25 mg by mouth at bedtime as needed for allergies., Disp: , Rfl:  .  doxycycline (VIBRA-TABS) 100 MG tablet, Take 1 tablet (100 mg total) by mouth 2 (two) times daily., Disp: 28 tablet, Rfl: 0 .  escitalopram (LEXAPRO) 10 MG tablet, Take 10 mg by mouth daily. , Disp: , Rfl:  .  fluticasone (FLONASE) 50 MCG/ACT nasal spray, Place 2 sprays into both nostrils every evening. , Disp: , Rfl:  .  glipiZIDE (GLUCOTROL) 5 MG tablet, Take 5 mg by mouth daily before breakfast. , Disp: , Rfl:  .  lisinopril (PRINIVIL,ZESTRIL) 2.5 MG tablet, Take 2.5 mg by mouth every evening. , Disp: , Rfl:  .  sevelamer carbonate (RENVELA) 800 MG tablet, Take 1,600-3,200 mg by mouth See admin instructions. Take 3200 mg with meals / 1600 mg with snacks, Disp: , Rfl:  Social History   Tobacco Use  Smoking Status Never Smoker  Smokeless Tobacco Never Used    Allergies  Allergen Reactions  . Penicillins Rash    Has patient had a PCN reaction causing immediate rash, facial/tongue/throat swelling, SOB or lightheadedness with hypotension: Yes Has patient had a PCN reaction causing severe rash involving mucus membranes or skin necrosis: Yes Has patient had a PCN reaction that required hospitalization: No Has patient had a PCN reaction occurring within the last 10 years: No If all of the above answers are "NO", then may proceed with Cephalosporin use.    Objective:  There were no vitals filed for this visit. There is no height or weight on file to calculate BMI. Constitutional Well  developed. Well nourished.  Vascular Dorsalis pedis pulses non palpable bilaterally. Posterior tibial pulsesnon palpable bilaterally. Capillary refill normal to all digits.  No cyanosis or clubbing noted. Pedal hair growth normal.  Neurologic Normal speech. Oriented to person, place, and time. Protective sensation absent  Dermatologic Wound Location: Right submetatarsal 2 ulcer with probing down to deep tissue/muscle.  Does not probe down to bone.  No clinical signs of infection noted.  No purulent drainage was expressed.  No malodor present. Wound Base: Mixed Granular/Fibrotic Peri-wound: Calloused Exudate: Scant/small amount Serosanguinous exudate Wound Measurements: -See below  Orthopedic: No pain to palpation either foot.   Radiographs: 2 views of skeletally mature adult right foot: No osteomyelitic changes noted.  No other signs of cortical destruction of the metatarsal head noted.  No soft tissue emphysema noted.  Third digit amputation noted.  Assessment:   No diagnosis found. Plan:  Patient was evaluated and treated and all questions answered.  Ulcer right submetatarsal 2 ulceration with probing down to deep tissue/capsule/muscle~stagnant -Debridement as below. -Dressed with Betadine wet-to-dry, DSD. -Continue off-loading with surgical shoe. -Surgical shoe was dispensed -I discussed with the patient that given that the location of the ulceration patient a high risk of losing that digit as well as then further amputation.  I am worried that patient may end up losing her metatarsal head if the wound continues to progress the way it is.  Patient will continue to offload her right away. -Patient had a vascular work-up which showed blockage in the thigh arteries.  Patient is scheduled for an angiogram on the 13th. -Continue doxycycline until the wound is regressing.  Procedure: Excisional Debridement of Wound~stagnant Tool: Sharp chisel blade/tissue nipper Rationale: Removal  of non-viable soft tissue from the wound to promote healing.  Anesthesia: none Pre-Debridement Wound Measurements: 1.5 cm x 1.4 cm x 0.6 cm  Post-Debridement Wound Measurements: 1.7 cm x 1.4 cm x 0.6 cm  Type of Debridement: Sharp Excisional Tissue Removed: Non-viable soft tissue Blood loss: Minimal (<50cc) Depth of Debridement: subcutaneous tissue. Technique: Sharp excisional debridement to bleeding, viable wound base.  Wound Progress: The wound is staying the same.  It is currently stagnation. Site healing conversation 7 Dressing: Dry, sterile, compression dressing. Disposition: Patient tolerated procedure well. Patient to return in 1 week for follow-up.  No follow-ups on file.

## 2020-01-25 NOTE — Telephone Encounter (Signed)
Spoke with the patient and he is scheduled with Dr. Delana Meyer for a right leg angio on 02/01/20 with a 9:00 am arrival time to the [facility]. Patient will do covid testing on 01/28/20 between 8-1 pm at the [facility]. Pre-procedure instructions were discussed and will be mailed.

## 2020-01-28 ENCOUNTER — Ambulatory Visit (INDEPENDENT_AMBULATORY_CARE_PROVIDER_SITE_OTHER): Payer: Medicare PPO | Admitting: Nurse Practitioner

## 2020-01-28 ENCOUNTER — Other Ambulatory Visit
Admission: RE | Admit: 2020-01-28 | Discharge: 2020-01-28 | Disposition: A | Discharge status: Home / Self Care | Payer: Medicare PPO | Source: Ambulatory Visit | Attending: Vascular Surgery | Admitting: Vascular Surgery

## 2020-01-28 ENCOUNTER — Other Ambulatory Visit: Payer: Self-pay

## 2020-01-28 DIAGNOSIS — Z01812 Encounter for preprocedural laboratory examination: Secondary | ICD-10-CM | POA: Insufficient documentation

## 2020-01-28 DIAGNOSIS — Z20822 Contact with and (suspected) exposure to covid-19: Secondary | ICD-10-CM | POA: Insufficient documentation

## 2020-01-29 LAB — SARS CORONAVIRUS 2 (TAT 6-24 HRS): SARS Coronavirus 2: NEGATIVE

## 2020-01-31 ENCOUNTER — Other Ambulatory Visit (HOSPITAL_COMMUNITY)
Admission: RE | Admit: 2020-01-31 | Discharge: 2020-01-31 | Disposition: A | Discharge status: Home / Self Care | Payer: Medicare PPO | Source: Ambulatory Visit | Attending: Neurology | Admitting: Neurology

## 2020-01-31 ENCOUNTER — Other Ambulatory Visit: Payer: Self-pay

## 2020-01-31 ENCOUNTER — Other Ambulatory Visit (INDEPENDENT_AMBULATORY_CARE_PROVIDER_SITE_OTHER): Payer: Self-pay | Admitting: Nurse Practitioner

## 2020-01-31 NOTE — Progress Notes (Addendum)
Called patient and left a voicemail. Letting him know he missed his 2:00 COVID-19 Test this afternoon. I explained to him if he could make it to the hospital by 4:30 I can do it today. If not he'll need to call me and reschedule for 02/01/2020.( Left # I can be reached at.) Waiting for call. Pt had COVID test done in [facility] 01/28/2020. Patient test was negative. His surgery was performed this morning at [facility]. Nothing further needed.   PS: Patient was arrived yesterday late because I hadn't heard from the patient and thought he might show up. Since I didn't hear from him I looked in labs and found the test had already been done. (this afternoon)

## 2020-02-01 ENCOUNTER — Ambulatory Visit
Admission: RE | Admit: 2020-02-01 | Discharge: 2020-02-01 | Disposition: A | Discharge status: Home / Self Care | Payer: Medicare PPO | Attending: Vascular Surgery | Admitting: Vascular Surgery

## 2020-02-01 ENCOUNTER — Encounter: Payer: Self-pay | Admitting: Vascular Surgery

## 2020-02-01 ENCOUNTER — Other Ambulatory Visit: Payer: Self-pay

## 2020-02-01 ENCOUNTER — Encounter
Admission: RE | Disposition: A | Discharge status: Home / Self Care | Payer: Self-pay | Source: Home / Self Care | Attending: Vascular Surgery

## 2020-02-01 DIAGNOSIS — Z79899 Other long term (current) drug therapy: Secondary | ICD-10-CM | POA: Insufficient documentation

## 2020-02-01 DIAGNOSIS — L97519 Non-pressure chronic ulcer of other part of right foot with unspecified severity: Secondary | ICD-10-CM | POA: Diagnosis not present

## 2020-02-01 DIAGNOSIS — I70223 Atherosclerosis of native arteries of extremities with rest pain, bilateral legs: Secondary | ICD-10-CM | POA: Insufficient documentation

## 2020-02-01 DIAGNOSIS — I120 Hypertensive chronic kidney disease with stage 5 chronic kidney disease or end stage renal disease: Secondary | ICD-10-CM | POA: Diagnosis not present

## 2020-02-01 DIAGNOSIS — I70229 Atherosclerosis of native arteries of extremities with rest pain, unspecified extremity: Secondary | ICD-10-CM

## 2020-02-01 DIAGNOSIS — Z7902 Long term (current) use of antithrombotics/antiplatelets: Secondary | ICD-10-CM | POA: Diagnosis not present

## 2020-02-01 DIAGNOSIS — E11621 Type 2 diabetes mellitus with foot ulcer: Secondary | ICD-10-CM | POA: Diagnosis not present

## 2020-02-01 DIAGNOSIS — Z7984 Long term (current) use of oral hypoglycemic drugs: Secondary | ICD-10-CM | POA: Insufficient documentation

## 2020-02-01 DIAGNOSIS — Z7982 Long term (current) use of aspirin: Secondary | ICD-10-CM | POA: Insufficient documentation

## 2020-02-01 DIAGNOSIS — E1151 Type 2 diabetes mellitus with diabetic peripheral angiopathy without gangrene: Secondary | ICD-10-CM | POA: Diagnosis not present

## 2020-02-01 DIAGNOSIS — I25118 Atherosclerotic heart disease of native coronary artery with other forms of angina pectoris: Secondary | ICD-10-CM | POA: Diagnosis not present

## 2020-02-01 DIAGNOSIS — E1122 Type 2 diabetes mellitus with diabetic chronic kidney disease: Secondary | ICD-10-CM | POA: Diagnosis not present

## 2020-02-01 DIAGNOSIS — N186 End stage renal disease: Secondary | ICD-10-CM | POA: Diagnosis not present

## 2020-02-01 DIAGNOSIS — Z992 Dependence on renal dialysis: Secondary | ICD-10-CM | POA: Diagnosis not present

## 2020-02-01 DIAGNOSIS — I70234 Atherosclerosis of native arteries of right leg with ulceration of heel and midfoot: Secondary | ICD-10-CM

## 2020-02-01 DIAGNOSIS — I70235 Atherosclerosis of native arteries of right leg with ulceration of other part of foot: Secondary | ICD-10-CM | POA: Diagnosis not present

## 2020-02-01 HISTORY — PX: LOWER EXTREMITY ANGIOGRAPHY: CATH118251

## 2020-02-01 HISTORY — DX: Sleep apnea, unspecified: G47.30

## 2020-02-01 LAB — GLUCOSE, CAPILLARY
Glucose-Capillary: 107 mg/dL — ABNORMAL HIGH (ref 70–99)
Glucose-Capillary: 88 mg/dL (ref 70–99)

## 2020-02-01 LAB — POTASSIUM (ARMC VASCULAR LAB ONLY): Potassium (ARMC vascular lab): 4.3 (ref 3.5–5.1)

## 2020-02-01 SURGERY — LOWER EXTREMITY ANGIOGRAPHY
Anesthesia: Moderate Sedation | Site: Leg Lower | Laterality: Right

## 2020-02-01 MED ORDER — FAMOTIDINE 20 MG PO TABS: 40.0000 mg | ORAL_TABLET | Freq: Once | ORAL | Status: DC | PRN

## 2020-02-01 MED ORDER — FENTANYL CITRATE (PF) 100 MCG/2ML IJ SOLN
INTRAMUSCULAR | Status: AC
  Filled 2020-02-01: qty 2

## 2020-02-01 MED ORDER — SODIUM CHLORIDE 0.9% FLUSH: 3.0000 mL | Freq: Two times a day (BID) | INTRAVENOUS | Status: DC

## 2020-02-01 MED ORDER — IODIXANOL 320 MG/ML IV SOLN
INTRAVENOUS | Status: DC | PRN
  Administered 2020-02-01: 110 mL via INTRA_ARTERIAL

## 2020-02-01 MED ORDER — MIDAZOLAM HCL 5 MG/5ML IJ SOLN
INTRAMUSCULAR | Status: AC
  Filled 2020-02-01: qty 5

## 2020-02-01 MED ORDER — ACETAMINOPHEN 325 MG PO TABS: 650.0000 mg | ORAL_TABLET | ORAL | Status: DC | PRN

## 2020-02-01 MED ORDER — OXYCODONE HCL 5 MG PO TABS: 5.0000 mg | ORAL_TABLET | ORAL | Status: DC | PRN

## 2020-02-01 MED ORDER — SODIUM CHLORIDE 0.9 % IV SOLN: 250.0000 mL | INTRAVENOUS | Status: DC | PRN

## 2020-02-01 MED ORDER — LABETALOL HCL 5 MG/ML IV SOLN: 10.0000 mg | INTRAVENOUS | Status: DC | PRN

## 2020-02-01 MED ORDER — MORPHINE SULFATE (PF) 4 MG/ML IV SOLN: 2.0000 mg | INTRAVENOUS | Status: DC | PRN

## 2020-02-01 MED ORDER — SODIUM CHLORIDE 0.9% FLUSH: 3.0000 mL | INTRAVENOUS | Status: DC | PRN

## 2020-02-01 MED ORDER — METHYLPREDNISOLONE SODIUM SUCC 125 MG IJ SOLR: 125.0000 mg | Freq: Once | INTRAMUSCULAR | Status: DC | PRN

## 2020-02-01 MED ORDER — FENTANYL CITRATE (PF) 100 MCG/2ML IJ SOLN
INTRAMUSCULAR | Status: DC | PRN
  Administered 2020-02-01: 25 ug
  Administered 2020-02-01: 50 ug
  Administered 2020-02-01: 50 ug via INTRAVENOUS
  Administered 2020-02-01: 50 ug

## 2020-02-01 MED ORDER — ATORVASTATIN CALCIUM 10 MG PO TABS
10.0000 mg | ORAL_TABLET | Freq: Every day | ORAL | Status: DC
  Filled 2020-02-01: qty 1

## 2020-02-01 MED ORDER — ONDANSETRON HCL 4 MG/2ML IJ SOLN: 4.0000 mg | Freq: Four times a day (QID) | INTRAMUSCULAR | Status: DC | PRN

## 2020-02-01 MED ORDER — HYDRALAZINE HCL 20 MG/ML IJ SOLN: 5.0000 mg | INTRAMUSCULAR | Status: DC | PRN

## 2020-02-01 MED ORDER — CLINDAMYCIN PHOSPHATE 300 MG/50ML IV SOLN: 300.0000 mg | Freq: Once | INTRAVENOUS | Status: AC

## 2020-02-01 MED ORDER — CLINDAMYCIN PHOSPHATE 300 MG/50ML IV SOLN
INTRAVENOUS | Status: AC
  Filled 2020-02-01: qty 50

## 2020-02-01 MED ORDER — SODIUM CHLORIDE 0.9 % IV SOLN: INTRAVENOUS | Status: DC

## 2020-02-01 MED ORDER — DIPHENHYDRAMINE HCL 50 MG/ML IJ SOLN: 50.0000 mg | Freq: Once | INTRAMUSCULAR | Status: DC | PRN

## 2020-02-01 MED ORDER — HEPARIN SODIUM (PORCINE) 1000 UNIT/ML IJ SOLN
INTRAMUSCULAR | Status: AC
  Filled 2020-02-01: qty 1

## 2020-02-01 MED ORDER — MIDAZOLAM HCL 2 MG/2ML IJ SOLN
INTRAMUSCULAR | Status: DC | PRN
  Administered 2020-02-01 (×2): 2 mg
  Administered 2020-02-01: 2 mg via INTRAVENOUS
  Administered 2020-02-01: 1 mg

## 2020-02-01 MED ORDER — HEPARIN SODIUM (PORCINE) 1000 UNIT/ML IJ SOLN
INTRAMUSCULAR | Status: DC | PRN
  Administered 2020-02-01: 5000 [IU] via INTRAVENOUS

## 2020-02-01 MED ORDER — HYDROMORPHONE HCL 1 MG/ML IJ SOLN: 1.0000 mg | Freq: Once | INTRAMUSCULAR | Status: DC | PRN

## 2020-02-01 MED ORDER — MIDAZOLAM HCL 2 MG/ML PO SYRP: 8.0000 mg | ORAL_SOLUTION | Freq: Once | ORAL | Status: DC | PRN

## 2020-02-01 SURGICAL SUPPLY — 31 items
BALLN LUTONIX 018 4X80X130 (BALLOONS) ×3
BALLN LUTONIX 018 5X150X130 (BALLOONS) ×3
BALLN LUTONIX 6X220X130 (BALLOONS) ×3
BALLN LUTONIX DCB 5X80X130 (BALLOONS) ×3
BALLOON LUTONIX 018 4X80X130 (BALLOONS) ×1 IMPLANT
BALLOON LUTONIX 018 5X150X130 (BALLOONS) ×1 IMPLANT
BALLOON LUTONIX 6X220X130 (BALLOONS) ×1 IMPLANT
BALLOON LUTONIX DCB 5X80X130 (BALLOONS) ×1 IMPLANT
CANISTER PENUMBRA ENGINE (MISCELLANEOUS) ×3 IMPLANT
CATH ANGIO 5F PIGTAIL 100CM (CATHETERS) ×3 IMPLANT
CATH ANGIO 5F PIGTAIL 65CM (CATHETERS) ×3 IMPLANT
CATH BEACON 5 .038 100 VERT TP (CATHETERS) ×3 IMPLANT
CATH INDIGO CAT6 KIT (CATHETERS) IMPLANT
CATH LIGHTNING 7 XTORQ 130 (CATHETERS) ×3 IMPLANT
CATH SEEKER .018X150 (CATHETERS) ×3 IMPLANT
DEVICE PRESTO INFLATION (MISCELLANEOUS) ×3 IMPLANT
DEVICE STARCLOSE SE CLOSURE (Vascular Products) ×3 IMPLANT
DEVICE TORQUE .025-.038 (MISCELLANEOUS) ×3 IMPLANT
GLIDEWIRE ADV .035X260CM (WIRE) ×3 IMPLANT
LIFESTENT SOLO 7X200X135 (Permanent Stent) ×3 IMPLANT
NEEDLE ENTRY 21GA 7CM ECHOTIP (NEEDLE) ×3 IMPLANT
PACK ANGIOGRAPHY (CUSTOM PROCEDURE TRAY) ×3 IMPLANT
SET INTRO CAPELLA COAXIAL (SET/KITS/TRAYS/PACK) ×3 IMPLANT
SHEATH BRITE TIP 5FRX11 (SHEATH) ×3 IMPLANT
SHEATH RAABE 7FR (SHEATH) ×3 IMPLANT
STENT LIFESTENT 5F 7X40X135 (Permanent Stent) ×3 IMPLANT
TUBING CONTRAST HIGH PRESS 72 (TUBING) ×3 IMPLANT
WIRE G V18X300CM (WIRE) ×3 IMPLANT
WIRE J 3MM .035X145CM (WIRE) ×3 IMPLANT
WIRE MAGIC TORQUE 315CM (WIRE) ×3 IMPLANT
WIRE RUNTHROUGH .014X300CM (WIRE) ×3 IMPLANT

## 2020-02-01 NOTE — H&P (Signed)
[facility] History & Physical Update  The patient was interviewed and re-examined.  The patient's previous History and Physical has been reviewed and is unchanged.  There is no change in the plan of care. We plan to proceed with the scheduled procedure.  Hortencia Pilar, MD  02/01/2020, 10:47 AM

## 2020-02-01 NOTE — Op Note (Signed)
[facility] Percutaneous Study/Intervention Procedural Note   Date of Surgery: 02/01/2020  Surgeon:  Renford Dills, MD.  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 Introduction catheter into right lower extremity 3rd order catheter placement  2. Contrast injection right lower extremity for distal runoff   3. Percutaneous transluminal angioplasty and stent placement right superficial femoral artery and popliteal 4. Mechanical thrombectomy right SFA using the penumbra CAT 7 lightening device.              5.  Star close closure left common femoral arteriotomy  Anesthesia: Conscious sedation was administered under my direct supervision by the interventional radiology RN. IV Versed plus fentanyl were utilized. Continuous ECG, pulse oximetry and blood pressure was monitored throughout the entire procedure.  Conscious sedation was for a total of 130 minutes.  Sheath: 7 French Raby left common femoral retrograde  Contrast: 110 cc  Fluoroscopy Time: 10.1 minutes  Indications: Ralph Peterson presents with ulceration nonhealing wounds of the right foot.  He has known atherosclerotic occlusive disease.  Is now undergoing angiography with revascularization for limb salvage.  The risks and benefits are reviewed all questions answered patient agrees to proceed.  Procedure: Ralph Peterson is a 54 y.o. y.o. male who was identified and appropriate procedural time out was performed. The patient was then placed supine on the table and prepped and draped in the usual sterile fashion.   Ultrasound was placed in the sterile sleeve and the left groin was evaluated the left common femoral artery was echolucent and pulsatile indicating patency.  Image was recorded for the permanent  record and under real-time visualization a microneedle was inserted into the common femoral artery microwire followed by a micro-sheath.  A J-wire was then advanced through the micro-sheath and a  5 [place] sheath was then inserted over a J-wire. J-wire was then advanced and a 5 French pigtail catheter was positioned at the level of T12. AP projection of the aorta was then obtained. Pigtail catheter was repositioned to above the bifurcation and a LAO view of the pelvis was obtained.  Subsequently a pigtail catheter with the stiff angle Glidewire was used to cross the aortic bifurcation the catheter wire were advanced down into the right distal external iliac artery. Oblique view of the femoral bifurcation was then obtained and subsequently the wire was reintroduced and the pigtail catheter negotiated into the SFA representing third order catheter placement. Distal runoff was then performed.  Diagnostic interpretation: The abdominal aorta is opacified with a bolus injection contrast.  It is widely patent although there is diffuse disease there are no hemodynamically significant stenoses.  Aortic bifurcation is patent as is the bilateral common internal and external iliac arteries.  No hemodynamically significant stenoses are noted in the common or external iliac arteries bilaterally.  The right common femoral and profunda femoris are patent as is the proximal superficial femoral artery.  There is occlusion of the mid superficial femoral artery that extends down to the above-knee popliteal.  There is reconstitution at the level of the femoral condyles but 1 greater than 70% stenosis in the popliteal at the level of the tibial plateau.  Distal to this lesion the popliteal is patent without hemodynamically significant stenosis.  The trifurcation is patent but heavily diseased.  The posterior tibial is occluded throughout its entire course and there is no meaningful reconstitution.  The peroneal is patent  and of good  size it is the dominant runoff to the foot extending down to its distal terminus where collateralizes to the foot.  The anterior tibial is diffusely diseased in its origin and then occludes throughout the first one third.  In the mid anterior tibial there is reconstitution at remains patent down to the ankle filling the dorsalis pedis.  5000 units of heparin was then given and allowed to circulate and a 7 [place] Raby sheath was advanced up and over the bifurcation and positioned in the femoral artery  KMP  catheter and stiff angle Glidewire were then negotiated down into the distal popliteal.  Distal runoff was then completed by hand injection through the catheter to verify intraluminal position.   The 4 mm x 150 mm Lutonix drug-eluting balloon was then used to angioplasty the SFA and popliteal.  Initially the penumbra CAT 6 catheter was utilized to perform thrombectomy but then we upsized to the penumbra CAT 7 lightening device.  A pass was made through the lesion with restoration of flow however greater than 70% stenosis is noted throughout this area.  Therefore a 7 x 200 followed by a 7 x 60 life stent was deployed in series distally was postdilated with a 5 mm Lutonix drug-eluting balloon and proximally it was postdilated with a 6 mm balloon.  The inflations were to 14 atm for 45 seconds.  Follow-up imaging now demonstrated wide patency of the SFA and popliteal in its entirety.  There is less than 10% residual stenosis.  Distal runoff is preserved.  After review of these images the sheath is pulled into the left external iliac oblique of the common femoral is obtained and a Star close device deployed. There no immediate complications.   Findings:  The abdominal aorta is opacified with a bolus injection contrast.  It is widely patent although there is diffuse disease there are no hemodynamically significant stenoses.  Aortic bifurcation is patent as is the bilateral common internal and external  iliac arteries.  No hemodynamically significant stenoses are noted in the common or external iliac arteries bilaterally.  The right common femoral and profunda femoris are patent as is the proximal superficial femoral artery.  There is occlusion of the mid superficial femoral artery that extends down to the above-knee popliteal.  There is reconstitution at the level of the femoral condyles but 1 greater than 70% stenosis in the popliteal at the level of the tibial plateau.  Distal to this lesion the popliteal is patent without hemodynamically significant stenosis.  The trifurcation is patent but heavily diseased.  The posterior tibial is occluded throughout its entire course and there is no meaningful reconstitution.  The peroneal is patent and of good size it is the dominant runoff to the foot extending down to its distal terminus where collateralizes to the foot.  The anterior tibial is diffusely diseased in its origin and then occludes throughout the first one third.  In the mid anterior tibial there is reconstitution at remains patent down to the ankle filling the dorsalis pedis.  Following mechanical thrombectomy there is restoration of luminal flow with retrieval of thrombus.  Following angioplasty there is greater than 50% residual stenosis and therefore life stents are deployed and postdilated to 6 mm proximally and 5 mm distally.  Follow-up imaging now demonstrates uniform flow throughout the SFA popliteal with preservation of the distal runoff.   Summary: Successful recanalization right lower extremity for limb salvage    Disposition: Patient was taken to the  recovery room in stable condition having tolerated the procedure well.  Harbor Paster, Dolores Lory 02/01/2020,12:55 PM

## 2020-02-01 NOTE — Discharge Instructions (Signed)
Femoral Site Care This sheet gives you information about how to care for yourself after your procedure. Your health care provider may also give you more specific instructions. If you have problems or questions, contact your health care provider. What can I expect after the procedure? After the procedure, it is common to have:  Bruising that usually fades within 1-2 weeks.  Tenderness at the site. Follow these instructions at home: Wound care  Follow instructions from your health care provider about how to take care of your insertion site. Make sure you: ? Wash your hands with soap and water before you change your bandage (dressing). If soap and water are not available, use hand sanitizer. ? Change your dressing as told by your health care provider. ? Leave stitches (sutures), skin glue, or adhesive strips in place. These skin closures may need to stay in place for 2 weeks or longer. If adhesive strip edges start to loosen and curl up, you may trim the loose edges. Do not remove adhesive strips completely unless your health care provider tells you to do that.  Do not take baths, swim, or use a hot tub until your health care provider approves.  You may shower 24-48 hours after the procedure or as told by your health care provider. ? Gently wash the site with plain soap and water. ? Pat the area dry with a clean towel. ? Do not rub the site. This may cause bleeding.  Do not apply powder or lotion to the site. Keep the site clean and dry.  Check your femoral site every day for signs of infection. Check for: ? Redness, swelling, or pain. ? Fluid or blood. ? Warmth. ? Pus or a bad smell. Activity  For the first 2-3 days after your procedure, or as long as directed: ? Avoid climbing stairs as much as possible. ? Do not squat.  Do not lift anything that is heavier than 10 lb (4.5 kg), or the limit that you are told, until your health care provider says that it is safe.  Rest as  directed. ? Avoid sitting for a long time without moving. Get up to take short walks every 1-2 hours.  Do not drive for 24 hours if you were given a medicine to help you relax (sedative). General instructions  Take over-the-counter and prescription medicines only as told by your health care provider.  Keep all follow-up visits as told by your health care provider. This is important. Contact a health care provider if you have:  A fever or chills.  You have redness, swelling, or pain around your insertion site. Get help right away if:  The catheter insertion area swells very fast.  You pass out.  You suddenly start to sweat or your skin gets clammy.  The catheter insertion area is bleeding, and the bleeding does not stop when you hold steady pressure on the area.  The area near or just beyond the catheter insertion site becomes pale, cool, tingly, or numb. These symptoms may represent a serious problem that is an emergency. Do not wait to see if the symptoms will go away. Get medical help right away. Call your local emergency services (911 in the [place] Do not drive yourself to the hospital. Summary  After the procedure, it is common to have bruising that usually fades within 1-2 weeks.  Check your femoral site every day for signs of infection.  Do not lift anything that is heavier than 10 lb (4.5 kg), or the   limit that you are told, until your health care provider says that it is safe. This information is not intended to replace advice given to you by your health care provider. Make sure you discuss any questions you have with your health care provider. Document Revised: 07/21/2017 Document Reviewed: 07/21/2017 Elsevier Patient Education  2020 Elsevier Inc.    Moderate Conscious Sedation, Adult, Care After These instructions provide you with information about caring for yourself after your procedure. Your health care provider may also give you more specific instructions. Your  treatment has been planned according to current medical practices, but problems sometimes occur. Call your health care provider if you have any problems or questions after your procedure. What can I expect after the procedure? After your procedure, it is common:  To feel sleepy for several hours.  To feel clumsy and have poor balance for several hours.  To have poor judgment for several hours.  To vomit if you eat too soon. Follow these instructions at home: For at least 24 hours after the procedure:   Do not: ? Participate in activities where you could fall or become injured. ? Drive. ? Use heavy machinery. ? Drink alcohol. ? Take sleeping pills or medicines that cause drowsiness. ? Make important decisions or sign legal documents. ? Take care of children on your own.  Rest. Eating and drinking  Follow the diet recommended by your health care provider.  If you vomit: ? Drink water, juice, or soup when you can drink without vomiting. ? Make sure you have little or no nausea before eating solid foods. General instructions  Have a responsible adult stay with you until you are awake and alert.  Take over-the-counter and prescription medicines only as told by your health care provider.  If you smoke, do not smoke without supervision.  Keep all follow-up visits as told by your health care provider. This is important. Contact a health care provider if:  You keep feeling nauseous or you keep vomiting.  You feel light-headed.  You develop a rash.  You have a fever. Get help right away if:  You have trouble breathing. This information is not intended to replace advice given to you by your health care provider. Make sure you discuss any questions you have with your health care provider. Document Revised: 06/20/2017 Document Reviewed: 10/28/2015 Elsevier Patient Education  2020 Elsevier Inc.  

## 2020-02-01 NOTE — Sedation Documentation (Signed)
Clarification: During procedure, initial sedation drugs given disappeared from charting.  Drugs were re-charted as given after documentor in vascular lab started the case.  Actual sedation drug amounts given were compared with documentor and totals matched and were Versed-5mg , Fentanyl 169mcg.

## 2020-02-02 ENCOUNTER — Encounter: Payer: Self-pay | Admitting: Vascular Surgery

## 2020-02-03 ENCOUNTER — Other Ambulatory Visit: Payer: Self-pay

## 2020-02-03 ENCOUNTER — Ambulatory Visit: Payer: Medicare PPO | Admitting: Podiatry

## 2020-02-03 ENCOUNTER — Encounter: Payer: Self-pay | Admitting: Podiatry

## 2020-02-03 DIAGNOSIS — L97515 Non-pressure chronic ulcer of other part of right foot with muscle involvement without evidence of necrosis: Secondary | ICD-10-CM | POA: Diagnosis not present

## 2020-02-03 DIAGNOSIS — E0842 Diabetes mellitus due to underlying condition with diabetic polyneuropathy: Secondary | ICD-10-CM

## 2020-02-04 ENCOUNTER — Encounter: Payer: Self-pay | Admitting: Podiatry

## 2020-02-04 NOTE — Progress Notes (Signed)
Subjective:  Patient ID: Ralph Peterson, male    DOB: 01/24/1966,  MRN: [id number]  Chief Complaint  Patient presents with  . Diabetic Ulcer    "its about the same"    54 y.o. male presents for wound care.  Patient presents with a follow-up of her right submetatarsal 2 ulceration.  Patient states he has been doing about the same.  Is been doing Betadine wet-to-dry dressing changes.  He had his angiogram done couple of days ago.  He denies any other acute complaints   Review of Systems: Negative except as noted in the HPI. Denies N/V/F/Ch.  Past Medical History:  Diagnosis Date  . CAD (coronary artery disease)    a. 09/2010 Cath/PCI ([facility]): LM nl, LAD 53m, D1 80 (small), LCX 36m, OM1 30, RI 70 (small), RCA 70 (DES).  . Coronary artery disease   . COVID-19 virus infection 07/2019  . Diabetes mellitus without complication ([place])   . ESRD (end stage renal disease) ([place])   . Hyperlipidemia   . Hypertension   . Ischemic cardiomyopathy    a.  12/2011 Echo ([facility]): NL EF, mod LVH. Mild AS/MS, triv PR/TR. 07/2019 Echo: EF 25-30%, GR1 DD, inf/post HK, low nl RV fxn, mildly dil RA, triv MR, mild Ao sclerosis w/o stenosis.  . NSTEMI (non-ST elevated myocardial infarction) ([place]) 07/2019  . PAD (peripheral artery disease) ([place])    a. 03/2016 s/p PTA/DEB R SFA/popliteal/peroneal; b. 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 08/2017 Atherectomy/DEB dist L SFA/popliteal. PTA of L AT; d. 06/2018 PTA/DEB L SFA/popliteal/PT/AT; e. 01/2019 Stable ABIs.  . Sleep apnea     Current Outpatient Medications:  .  acetaminophen (TYLENOL) 500 MG tablet, Take 1,000 mg by mouth 2 (two) times daily as needed for moderate pain., Disp: , Rfl:  .  amLODipine (NORVASC) 5 MG tablet, Hold this medication because your blood pressure has been low. (Patient not taking: Reported on 02/01/2020), Disp: , Rfl: 1 .  aspirin EC 81 MG tablet, Take 81 mg by mouth every evening., Disp: , Rfl:  .  atorvastatin (LIPITOR) 40 MG tablet, Take  40 mg by mouth every evening. , Disp: , Rfl: 1 .  carvedilol (COREG) 25 MG tablet, Take 25 mg by mouth 2 (two) times daily with a meal. , Disp: , Rfl:  .  cetirizine (ZYRTEC) 10 MG tablet, Take 10 mg by mouth every evening. , Disp: , Rfl: 5 .  clopidogrel (PLAVIX) 75 MG tablet, TAKE 1 TABLET BY MOUTH ONCE DAILY., Disp: 15 tablet, Rfl: 0 .  diphenhydrAMINE (BENADRYL) 25 MG tablet, Take 25 mg by mouth at bedtime as needed for allergies., Disp: , Rfl:  .  doxycycline (VIBRA-TABS) 100 MG tablet, Take 1 tablet (100 mg total) by mouth 2 (two) times daily. (Patient not taking: Reported on 02/01/2020), Disp: 28 tablet, Rfl: 0 .  escitalopram (LEXAPRO) 10 MG tablet, Take 10 mg by mouth daily. , Disp: , Rfl:  .  fluticasone (FLONASE) 50 MCG/ACT nasal spray, Place 2 sprays into both nostrils every evening. , Disp: , Rfl:  .  glipiZIDE (GLUCOTROL) 5 MG tablet, Take 5 mg by mouth daily before breakfast. , Disp: , Rfl:  .  lisinopril (PRINIVIL,ZESTRIL) 2.5 MG tablet, Take 2.5 mg by mouth every evening.  (Patient not taking: Reported on 02/01/2020), Disp: , Rfl:  .  sevelamer carbonate (RENVELA) 800 MG tablet, Take 1,600-3,200 mg by mouth See admin instructions. Take 3200 mg with meals / 1600 mg with snacks, Disp: ,  Rfl:   Social History   Tobacco Use  Smoking Status Former Smoker  . Types: Cigarettes  . Quit date: 2010  . Years since quitting: 11.5  Smokeless Tobacco Never Used    Allergies  Allergen Reactions  . Penicillins Rash    Has patient had a PCN reaction causing immediate rash, facial/tongue/throat swelling, SOB or lightheadedness with hypotension: Yes Has patient had a PCN reaction causing severe rash involving mucus membranes or skin necrosis: Yes Has patient had a PCN reaction that required hospitalization: No Has patient had a PCN reaction occurring within the last 10 years: No If all of the above answers are "NO", then may proceed with Cephalosporin use.    Objective:  There were no  vitals filed for this visit. There is no height or weight on file to calculate BMI. Constitutional Well developed. Well nourished.  Vascular Dorsalis pedis pulses non palpable bilaterally. Posterior tibial pulsesnon palpable bilaterally. Capillary refill normal to all digits.  No cyanosis or clubbing noted. Pedal hair growth normal.  Neurologic Normal speech. Oriented to person, place, and time. Protective sensation absent  Dermatologic Wound Location: Right submetatarsal 2 ulcer with probing down to deep tissue/muscle.  Does not probe down to bone.  No clinical signs of infection noted.  No purulent drainage was expressed.  No malodor present. Wound Base: Mixed Granular/Fibrotic Peri-wound: Calloused Exudate: Scant/small amount Serosanguinous exudate Wound Measurements: -See below  Orthopedic: No pain to palpation either foot.   Radiographs: 2 views of skeletally mature adult right foot: No osteomyelitic changes noted.  No other signs of cortical destruction of the metatarsal head noted.  No soft tissue emphysema noted.  Third digit amputation noted.  Assessment:   1. Ulcer of right foot with muscle involvement without evidence of necrosis ([place])   2. Diabetes mellitus due to underlying condition with diabetic polyneuropathy, unspecified whether long term insulin use ([place])    Plan:  Patient was evaluated and treated and all questions answered.  Ulcer right submetatarsal 2 ulceration with probing down to deep tissue/capsule/muscle~stagnant -Debridement as below. -Dressed with Betadine wet-to-dry, DSD. -Continue off-loading with surgical shoe. -Surgical shoe was dispensed -I discussed with the patient that given that the location of the ulceration patient a high risk of losing that digit as well as then further amputation.  I am worried that patient may end up losing her metatarsal head if the wound continues to progress the way it is.  Patient will continue to offload her right  away. -Patient underwent vascular angiogram with intervention and had multiple stents placed to the right lower extremity.  Based on previous vascular noted appears that patient has the most dominant flow via the peroneal.  There is multiple blockages in the anterior tibial.  And patient does not have adequate supply from the posterior tibial at all. -Patient scheduled to follow-up with vascular and I would like for them to see if there is any further intervention to open the arteries.  Patient still does not have enough bleeding as when I attempted to debridement there is not much bleeding associated with it. -Continue doxycycline -I also believe patient will benefit from a graft application once there is enough soft tissue coverage over the bone.  I discussed this with the patient patient agrees with the plan. -I will work on [facility]ides medical graft approval  Procedure: Excisional Debridement of Wound~stagnant Tool: Sharp chisel blade/tissue nipper Rationale: Removal of non-viable soft tissue from the wound to promote healing.  Anesthesia: none Pre-Debridement Wound  Measurements: 1.5 cm x 1.4 cm x 0.6 cm  Post-Debridement Wound Measurements: 1.7 cm x 1.4 cm x 0.6 cm  Type of Debridement: Sharp Excisional Tissue Removed: Non-viable soft tissue Blood loss: Minimal (<50cc) Depth of Debridement: subcutaneous tissue. Technique: Sharp excisional debridement to bleeding, viable wound base.  Wound Progress: The wound is staying the same.  It is currently stagnation. Site healing conversation 7 Dressing: Dry, sterile, compression dressing. Disposition: Patient tolerated procedure well. Patient to return in 1 week for follow-up.  No follow-ups on file.

## 2020-02-15 ENCOUNTER — Encounter: Payer: Self-pay | Admitting: Podiatry

## 2020-02-15 ENCOUNTER — Other Ambulatory Visit: Payer: Self-pay

## 2020-02-15 ENCOUNTER — Encounter (INDEPENDENT_AMBULATORY_CARE_PROVIDER_SITE_OTHER): Payer: Self-pay

## 2020-02-15 ENCOUNTER — Ambulatory Visit: Payer: Medicare PPO | Admitting: Podiatry

## 2020-02-15 DIAGNOSIS — E0842 Diabetes mellitus due to underlying condition with diabetic polyneuropathy: Secondary | ICD-10-CM | POA: Diagnosis not present

## 2020-02-15 DIAGNOSIS — L97515 Non-pressure chronic ulcer of other part of right foot with muscle involvement without evidence of necrosis: Secondary | ICD-10-CM | POA: Diagnosis not present

## 2020-02-15 NOTE — Progress Notes (Signed)
Subjective:  Patient ID: Ralph Peterson, male    DOB: 1966-06-27,  MRN: [id number]  Chief Complaint  Patient presents with  . Diabetic Ulcer    "its doing better, my daughter thinks its closing up some"    54 y.o. male presents for wound care.  Patient presents with a follow-up of her right submetatarsal 2 ulceration.  Patient states he has been doing about the same.  Is been doing Betadine wet-to-dry dressing changes.  He is scheduled to see Schnier again for another intervention that could be done maybe at the level of the ankle.  I will continue Betadine wet-to-dry dressing changes seems to keep it stable.   Review of Systems: Negative except as noted in the HPI. Denies N/V/F/Ch.  Past Medical History:  Diagnosis Date  . CAD (coronary artery disease)    a. 09/2010 Cath/PCI ([facility]): LM nl, LAD 46m, D1 80 (small), LCX 80m, OM1 30, RI 70 (small), RCA 70 (DES).  . Coronary artery disease   . COVID-19 virus infection 07/2019  . Diabetes mellitus without complication ([place])   . ESRD (end stage renal disease) ([place])   . Hyperlipidemia   . Hypertension   . Ischemic cardiomyopathy    a.  12/2011 Echo ([facility]): NL EF, mod LVH. Mild AS/MS, triv PR/TR. 07/2019 Echo: EF 25-30%, GR1 DD, inf/post HK, low nl RV fxn, mildly dil RA, triv MR, mild Ao sclerosis w/o stenosis.  . NSTEMI (non-ST elevated myocardial infarction) ([place]) 07/2019  . PAD (peripheral artery disease) ([place])    a. 03/2016 s/p PTA/DEB R SFA/popliteal/peroneal; b. 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 08/2017 Atherectomy/DEB dist L SFA/popliteal. PTA of L AT; d. 06/2018 PTA/DEB L SFA/popliteal/PT/AT; e. 01/2019 Stable ABIs.  . Sleep apnea     Current Outpatient Medications:  .  acetaminophen (TYLENOL) 500 MG tablet, Take 1,000 mg by mouth 2 (two) times daily as needed for moderate pain., Disp: , Rfl:  .  amLODipine (NORVASC) 5 MG tablet, Hold this medication because your blood pressure has been low. (Patient not taking: Reported on  02/01/2020), Disp: , Rfl: 1 .  aspirin EC 81 MG tablet, Take 81 mg by mouth every evening., Disp: , Rfl:  .  atorvastatin (LIPITOR) 40 MG tablet, Take 40 mg by mouth every evening. , Disp: , Rfl: 1 .  carvedilol (COREG) 25 MG tablet, Take 25 mg by mouth 2 (two) times daily with a meal. , Disp: , Rfl:  .  cetirizine (ZYRTEC) 10 MG tablet, Take 10 mg by mouth every evening. , Disp: , Rfl: 5 .  clopidogrel (PLAVIX) 75 MG tablet, TAKE 1 TABLET BY MOUTH ONCE DAILY., Disp: 15 tablet, Rfl: 0 .  diphenhydrAMINE (BENADRYL) 25 MG tablet, Take 25 mg by mouth at bedtime as needed for allergies., Disp: , Rfl:  .  doxycycline (VIBRA-TABS) 100 MG tablet, Take 1 tablet (100 mg total) by mouth 2 (two) times daily. (Patient not taking: Reported on 02/01/2020), Disp: 28 tablet, Rfl: 0 .  escitalopram (LEXAPRO) 10 MG tablet, Take 10 mg by mouth daily. , Disp: , Rfl:  .  fluticasone (FLONASE) 50 MCG/ACT nasal spray, Place 2 sprays into both nostrils every evening. , Disp: , Rfl:  .  glipiZIDE (GLUCOTROL) 5 MG tablet, Take 5 mg by mouth daily before breakfast. , Disp: , Rfl:  .  lisinopril (PRINIVIL,ZESTRIL) 2.5 MG tablet, Take 2.5 mg by mouth every evening.  (Patient not taking: Reported on 02/01/2020), Disp: , Rfl:  .  sevelamer  carbonate (RENVELA) 800 MG tablet, Take 1,600-3,200 mg by mouth See admin instructions. Take 3200 mg with meals / 1600 mg with snacks, Disp: , Rfl:   Social History   Tobacco Use  Smoking Status Former Smoker  . Types: Cigarettes  . Quit date: 2010  . Years since quitting: 11.5  Smokeless Tobacco Never Used    Allergies  Allergen Reactions  . Penicillins Rash    Has patient had a PCN reaction causing immediate rash, facial/tongue/throat swelling, SOB or lightheadedness with hypotension: Yes Has patient had a PCN reaction causing severe rash involving mucus membranes or skin necrosis: Yes Has patient had a PCN reaction that required hospitalization: No Has patient had a PCN reaction  occurring within the last 10 years: No If all of the above answers are "NO", then may proceed with Cephalosporin use.    Objective:  There were no vitals filed for this visit. There is no height or weight on file to calculate BMI. Constitutional Well developed. Well nourished.  Vascular Dorsalis pedis pulses non palpable bilaterally. Posterior tibial pulsesnon palpable bilaterally. Capillary refill normal to all digits.  No cyanosis or clubbing noted. Pedal hair growth normal.  Neurologic Normal speech. Oriented to person, place, and time. Protective sensation absent  Dermatologic Wound Location: Right submetatarsal 2 ulcer with probing down to deep tissue/muscle.  Does not probe down to bone.  No clinical signs of infection noted.  No purulent drainage was expressed.  No malodor present. Wound Base: Mixed Granular/Fibrotic Peri-wound: Calloused Exudate: Scant/small amount Serosanguinous exudate Wound Measurements: -See below  Orthopedic: No pain to palpation either foot.   Radiographs: 2 views of skeletally mature adult right foot: No osteomyelitic changes noted.  No other signs of cortical destruction of the metatarsal head noted.  No soft tissue emphysema noted.  Third digit amputation noted.  Assessment:   1. Diabetes mellitus due to underlying condition with diabetic polyneuropathy, unspecified whether long term insulin use ([place])   2. Ulcer of right foot with muscle involvement without evidence of necrosis [facility])    Plan:  Patient was evaluated and treated and all questions answered.  Ulcer right submetatarsal 2 ulceration with probing down to deep tissue/capsule/muscle~stagnant -Debridement as below. -Dressed with Betadine wet-to-dry, DSD. -Continue off-loading with surgical shoe. -Surgical shoe was dispensed -I discussed with the patient that given that the location of the ulceration patient a high risk of losing that digit as well as then further amputation.  I am  worried that patient may end up losing her metatarsal head if the wound continues to progress the way it is.  Patient will continue to offload her right away. -Patient underwent vascular angiogram with intervention and had multiple stents placed to the right lower extremity.  Based on previous vascular noted appears that patient has the most dominant flow via the peroneal.  There is multiple blockages in the anterior tibial.  And patient does not have adequate supply from the posterior tibial at all. -Patient scheduled to follow-up with vascular and I would like for them to see if there is any further intervention to open the arteries.  Patient still does not have enough bleeding as when I attempted to debridement there is not much bleeding associated with it. -Continue doxycycline -I also believe patient will benefit from a graft application once there is enough soft tissue coverage over the bone.  I discussed this with the patient patient agrees with the plan. -I will work on tides medical graft approval  Procedure: Excisional  Debridement of Wound~stagnant Tool: Sharp chisel blade/tissue nipper Rationale: Removal of non-viable soft tissue from the wound to promote healing.  Anesthesia: none Pre-Debridement Wound Measurements: 1.5 cm x 1.4 cm x 0.6 cm  Post-Debridement Wound Measurements: 1.7 cm x 1.4 cm x 0.6 cm  Type of Debridement: Sharp Excisional Tissue Removed: Non-viable soft tissue Blood loss: Minimal (<50cc) Depth of Debridement: subcutaneous tissue. Technique: Sharp excisional debridement to bleeding, viable wound base.  Wound Progress: The wound is staying the same.  It is currently stagnation. Site healing conversation 7 Dressing: Dry, sterile, compression dressing. Disposition: Patient tolerated procedure well. Patient to return in 1 week for follow-up.  No follow-ups on file.

## 2020-02-23 ENCOUNTER — Other Ambulatory Visit (INDEPENDENT_AMBULATORY_CARE_PROVIDER_SITE_OTHER): Payer: Self-pay | Admitting: Vascular Surgery

## 2020-02-23 DIAGNOSIS — I739 Peripheral vascular disease, unspecified: Secondary | ICD-10-CM

## 2020-02-23 DIAGNOSIS — Z95820 Peripheral vascular angioplasty status with implants and grafts: Secondary | ICD-10-CM

## 2020-02-23 NOTE — Progress Notes (Deleted)
MRN : [id number]  Ralph Peterson is a 54 y.o. (1965/09/03) male who presents with chief complaint of No chief complaint on file. Marland Kitchen  History of Present Illness:   The patient returns to the office for followup and review status post angiogram with intervention on 02/01/2020:  Percutaneous transluminal angioplasty and stent placement right superficial femoral artery and popliteal artery Mechanical thrombectomy right SFA using the penumbra CAT 7 lightening device   The patient notes improvement in the lower extremity symptoms. No interval shortening of the patient's claudication distance or rest pain symptoms. Previous wounds have now healed.  No new ulcers or wounds have occurred since the last visit.  There have been no significant changes to the patient's overall health care.  The patient denies amaurosis fugax or recent TIA symptoms. There are no recent neurological changes noted. The patient denies history of DVT, PE or superficial thrombophlebitis. The patient denies recent episodes of angina or shortness of breath.   ABI's Rt=*** and Lt=***  (previous ABI's Rt=*** and Lt=***) Duplex US of the *** lower extremity arterial system shows ***  No outpatient medications have been marked as taking for the 02/24/20 encounter (Appointment) with Delana Meyer, Dolores Lory, MD.    Past Medical History:  Diagnosis Date  . CAD (coronary artery disease)    a. 09/2010 Cath/PCI ([facility]): LM nl, LAD 33m, D1 80 (small), LCX 37m, OM1 30, RI 70 (small), RCA 70 (DES).  . Coronary artery disease   . COVID-19 virus infection 07/2019  . Diabetes mellitus without complication ([place])   . ESRD (end stage renal disease) ([place])   . Hyperlipidemia   . Hypertension   . Ischemic cardiomyopathy    a.  12/2011 Echo ([facility]): NL EF, mod LVH. Mild AS/MS, triv PR/TR. 07/2019 Echo: EF 25-30%, GR1 DD, inf/post HK, low nl RV fxn, mildly dil RA, triv MR, mild Ao sclerosis w/o stenosis.  . NSTEMI (non-ST elevated myocardial  infarction) ([place]) 07/2019  . PAD (peripheral artery disease) ([place])    a. 03/2016 s/p PTA/DEB R SFA/popliteal/peroneal; b. 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 08/2017 Atherectomy/DEB dist L SFA/popliteal. PTA of L AT; d. 06/2018 PTA/DEB L SFA/popliteal/PT/AT; e. 01/2019 Stable ABIs.  . Sleep apnea     Past Surgical History:  Procedure Laterality Date  . ABDOMINAL AORTOGRAM N/A 09/10/2017   Procedure: ABDOMINAL AORTOGRAM;  Surgeon: Wellington Hampshire, MD;  Location: [place] [facility];  Service: Cardiovascular;  Laterality: N/A;  . AMPUTATION TOE Bilateral    one toe on right, all five toes on the left  . CARDIAC CATHETERIZATION  2012   [facility]; X1 stent 2.5 x 33 mm xience stent Distal TCA  . LOWER EXTREMITY ANGIOGRAPHY Bilateral 09/10/2017   Procedure: Lower Extremity Angiography;  Surgeon: Wellington Hampshire, MD;  Location: [place] [facility];  Service: Cardiovascular;  Laterality: Bilateral;  . LOWER EXTREMITY ANGIOGRAPHY Left 06/23/2018   Procedure: LOWER EXTREMITY ANGIOGRAPHY;  Surgeon: Katha Cabal, MD;  Location: [place] [facility];  Service: Cardiovascular;  Laterality: Left;  . LOWER EXTREMITY ANGIOGRAPHY Right 02/01/2020   Procedure: LOWER EXTREMITY ANGIOGRAPHY;  Surgeon: Katha Cabal, MD;  Location: [place] [facility];  Service: Cardiovascular;  Laterality: Right;  . PERIPHERAL VASCULAR ATHERECTOMY Left 09/10/2017   Procedure: PERIPHERAL VASCULAR ATHERECTOMY;  Surgeon: Wellington Hampshire, MD;  Location: [place] [facility];  Service: Cardiovascular;  Laterality: Left;  SFA/POPLITEAL  . PERIPHERAL VASCULAR BALLOON ANGIOPLASTY Left 09/10/2017   Procedure:  PERIPHERAL VASCULAR BALLOON ANGIOPLASTY;  Surgeon: Wellington Hampshire, MD;  Location: [place] [facility];  Service: Cardiovascular;  Laterality: Left;  ANTERIAL TIBIAL  . PERIPHERAL VASCULAR CATHETERIZATION Right 03/26/2016   Procedure: Lower Extremity Angiography;  Surgeon: Katha Cabal, MD;  Location: [place] [facility];  Service: Cardiovascular;  Laterality: Right;  . PERIPHERAL VASCULAR INTERVENTION Left 09/10/2017   Procedure: PERIPHERAL VASCULAR INTERVENTION;  Surgeon: Wellington Hampshire, MD;  Location: [place] [facility];  Service: Cardiovascular;  Laterality: Left;  SFA/POPLITEAL    Social History Social History   Tobacco Use  . Smoking status: Former Smoker    Types: Cigarettes    Quit date: 2010    Years since quitting: 11.5  . Smokeless tobacco: Never Used  Vaping Use  . Vaping Use: Never used  Substance Use Topics  . Alcohol use: Not Currently  . Drug use: No    Family History Family History  Problem Relation Age of Onset  . Hypertension Mother     Allergies  Allergen Reactions  . Penicillins Rash    Has patient had a PCN reaction causing immediate rash, facial/tongue/throat swelling, SOB or lightheadedness with hypotension: Yes Has patient had a PCN reaction causing severe rash involving mucus membranes or skin necrosis: Yes Has patient had a PCN reaction that required hospitalization: No Has patient had a PCN reaction occurring within the last 10 years: No If all of the above answers are "NO", then may proceed with Cephalosporin use.      REVIEW OF SYSTEMS (Negative unless checked)  Constitutional: [] Weight loss  [] Fever  [] Chills Cardiac: [] Chest pain   [] Chest pressure   [] Palpitations   [] Shortness of breath when laying flat   [] Shortness of breath with exertion. Vascular:  [] Pain in legs with walking   [] Pain in legs at rest  [] History of DVT   [] Phlebitis   [] Swelling in legs   [] Varicose veins   [] Non-healing ulcers Pulmonary:   [] Uses home oxygen   [] Productive cough   [] Hemoptysis   [] Wheeze  [] COPD   [] Asthma Neurologic:  [] Dizziness   [] Seizures   [] History of stroke   [] History of TIA  [] Aphasia   [] Vissual changes   [] Weakness or numbness in arm   [] Weakness or numbness in leg Musculoskeletal:   [] Joint swelling   [] Joint pain   [] Low back  pain Hematologic:  [] Easy bruising  [] Easy bleeding   [] Hypercoagulable state   [] Anemic Gastrointestinal:  [] Diarrhea   [] Vomiting  [] Gastroesophageal reflux/heartburn   [] Difficulty swallowing. Genitourinary:  [] Chronic kidney disease   [] Difficult urination  [] Frequent urination   [] Blood in urine Skin:  [] Rashes   [] Ulcers  Psychological:  [] History of anxiety   []  History of major depression.  Physical Examination  There were no vitals filed for this visit. There is no height or weight on file to calculate BMI. Gen: WD/WN, NAD Head: [place]/AT, No temporalis wasting.  Ear/Nose/Throat: Hearing grossly intact, nares w/o erythema or drainage Eyes: PER, EOMI, sclera nonicteric.  Neck: Supple, no large masses.   Pulmonary:  Good air movement, no audible wheezing bilaterally, no use of accessory muscles.  Cardiac: RRR, no JVD Vascular: *** Vessel Right Left  Radial Palpable Palpable  Ulnar Palpable Palpable  Brachial Palpable Palpable  Carotid Palpable Palpable  Femoral Palpable Palpable  Popliteal Palpable Palpable  PT Palpable Palpable  DP Palpable Palpable  Gastrointestinal: Non-distended. No guarding/no peritoneal signs.  Musculoskeletal: M/S 5/5 throughout.  No deformity or atrophy.  Neurologic: CN  2-12 intact. Symmetrical.  Speech is fluent. Motor exam as listed above. Psychiatric: Judgment intact, Mood & affect appropriate for pt's clinical situation. Dermatologic: No rashes or ulcers noted.  No changes consistent with cellulitis. Lymph : No lichenification or skin changes of chronic lymphedema.  CBC Lab Results  Component Value Date   WBC 5.7 07/29/2019   HGB 11.3 (L) 07/29/2019   HCT 35.7 (L) 07/29/2019   MCV 97.8 07/29/2019   PLT 178 07/29/2019    BMET    Component Value Date/Time   NA 132 (L) 07/29/2019 0903   NA 139 03/19/2016 1423   K 5.0 07/29/2019 0903   CL 91 (L) 07/29/2019 0903   CO2 26 07/29/2019 0903   GLUCOSE 344 (H) 07/29/2019 0903   BUN 48 (H)  07/29/2019 0903   BUN 33 (H) 03/19/2016 1423   CREATININE 7.42 (H) 07/29/2019 0903   CALCIUM 8.3 (L) 07/29/2019 0903   GFRNONAA 8 (L) 07/29/2019 0903   GFRAA 9 (L) 07/29/2019 0903   CrCl cannot be calculated (Patient's most recent lab result is older than the maximum 21 days allowed.).  COAG Lab Results  Component Value Date   INR 1.2 07/28/2019   INR 1.16 09/05/2017    Radiology PERIPHERAL VASCULAR CATHETERIZATION  Result Date: 02/01/2020 See Op Note   Outside Studies/Documentation *** pages of outside documents were reviewed.  They showed ***.  Assessment/Plan There are no diagnoses linked to this encounter.   Hortencia Pilar, MD  02/23/2020 2:25 PM

## 2020-02-24 ENCOUNTER — Encounter (INDEPENDENT_AMBULATORY_CARE_PROVIDER_SITE_OTHER): Payer: Medicare PPO

## 2020-02-24 ENCOUNTER — Ambulatory Visit (INDEPENDENT_AMBULATORY_CARE_PROVIDER_SITE_OTHER): Payer: Medicare PPO | Admitting: Vascular Surgery

## 2020-03-02 ENCOUNTER — Ambulatory Visit: Payer: Medicare PPO | Admitting: Podiatry

## 2020-03-02 ENCOUNTER — Other Ambulatory Visit: Payer: Self-pay

## 2020-03-02 ENCOUNTER — Encounter: Payer: Self-pay | Admitting: Podiatry

## 2020-03-02 DIAGNOSIS — E0842 Diabetes mellitus due to underlying condition with diabetic polyneuropathy: Secondary | ICD-10-CM | POA: Diagnosis not present

## 2020-03-02 DIAGNOSIS — L97515 Non-pressure chronic ulcer of other part of right foot with muscle involvement without evidence of necrosis: Secondary | ICD-10-CM

## 2020-03-03 ENCOUNTER — Other Ambulatory Visit: Payer: Self-pay

## 2020-03-03 ENCOUNTER — Emergency Department: Payer: Medicare PPO

## 2020-03-03 ENCOUNTER — Encounter: Payer: Self-pay | Admitting: Podiatry

## 2020-03-03 ENCOUNTER — Encounter: Payer: Self-pay | Admitting: Emergency Medicine

## 2020-03-03 ENCOUNTER — Emergency Department
Admission: EM | Admit: 2020-03-03 | Discharge: 2020-03-03 | Disposition: A | Discharge status: Home / Self Care | Payer: Medicare PPO | Attending: Student in an Organized Health Care Education/Training Program | Admitting: Student in an Organized Health Care Education/Training Program

## 2020-03-03 DIAGNOSIS — J40 Bronchitis, not specified as acute or chronic: Secondary | ICD-10-CM | POA: Insufficient documentation

## 2020-03-03 DIAGNOSIS — Z20822 Contact with and (suspected) exposure to covid-19: Secondary | ICD-10-CM | POA: Diagnosis not present

## 2020-03-03 DIAGNOSIS — I120 Hypertensive chronic kidney disease with stage 5 chronic kidney disease or end stage renal disease: Secondary | ICD-10-CM | POA: Insufficient documentation

## 2020-03-03 DIAGNOSIS — Z992 Dependence on renal dialysis: Secondary | ICD-10-CM | POA: Diagnosis not present

## 2020-03-03 DIAGNOSIS — I214 Non-ST elevation (NSTEMI) myocardial infarction: Secondary | ICD-10-CM | POA: Insufficient documentation

## 2020-03-03 DIAGNOSIS — R05 Cough: Secondary | ICD-10-CM | POA: Diagnosis present

## 2020-03-03 DIAGNOSIS — N186 End stage renal disease: Secondary | ICD-10-CM | POA: Diagnosis not present

## 2020-03-03 DIAGNOSIS — I2510 Atherosclerotic heart disease of native coronary artery without angina pectoris: Secondary | ICD-10-CM | POA: Diagnosis not present

## 2020-03-03 DIAGNOSIS — Z87891 Personal history of nicotine dependence: Secondary | ICD-10-CM | POA: Insufficient documentation

## 2020-03-03 DIAGNOSIS — E1122 Type 2 diabetes mellitus with diabetic chronic kidney disease: Secondary | ICD-10-CM | POA: Insufficient documentation

## 2020-03-03 DIAGNOSIS — Z7982 Long term (current) use of aspirin: Secondary | ICD-10-CM | POA: Insufficient documentation

## 2020-03-03 DIAGNOSIS — Z79899 Other long term (current) drug therapy: Secondary | ICD-10-CM | POA: Insufficient documentation

## 2020-03-03 LAB — BASIC METABOLIC PANEL
Anion gap: 18 — ABNORMAL HIGH (ref 5–15)
BUN: 62 mg/dL — ABNORMAL HIGH (ref 6–20)
CO2: 25 mmol/L (ref 22–32)
Calcium: 8.3 mg/dL — ABNORMAL LOW (ref 8.9–10.3)
Chloride: 87 mmol/L — ABNORMAL LOW (ref 98–111)
Creatinine, Ser: 7.65 mg/dL — ABNORMAL HIGH (ref 0.61–1.24)
GFR calc Af Amer: 9 mL/min — ABNORMAL LOW (ref >60)
GFR calc non Af Amer: 7 mL/min — ABNORMAL LOW (ref >60)
Glucose, Bld: 377 mg/dL — ABNORMAL HIGH (ref 70–99)
Potassium: 4.3 mmol/L (ref 3.5–5.1)
Sodium: 130 mmol/L — ABNORMAL LOW (ref 135–145)

## 2020-03-03 LAB — GLUCOSE, CAPILLARY: Glucose-Capillary: 345 mg/dL — ABNORMAL HIGH (ref 70–99)

## 2020-03-03 LAB — CBC WITH DIFFERENTIAL/PLATELET
Abs Immature Granulocytes: 0.18 10*3/uL — ABNORMAL HIGH (ref 0.00–0.07)
Basophils Absolute: 0.1 10*3/uL (ref 0.0–0.1)
Basophils Relative: 1 %
Eosinophils Absolute: 0.1 10*3/uL (ref 0.0–0.5)
Eosinophils Relative: 1 %
HCT: 33 % — ABNORMAL LOW (ref 39.0–52.0)
Hemoglobin: 10.9 g/dL — ABNORMAL LOW (ref 13.0–17.0)
Immature Granulocytes: 2 %
Lymphocytes Relative: 15 %
Lymphs Abs: 1.5 10*3/uL (ref 0.7–4.0)
MCH: 29.7 pg (ref 26.0–34.0)
MCHC: 33 g/dL (ref 30.0–36.0)
MCV: 89.9 fL (ref 80.0–100.0)
Monocytes Absolute: 0.8 10*3/uL (ref 0.1–1.0)
Monocytes Relative: 8 %
Neutro Abs: 7 10*3/uL (ref 1.7–7.7)
Neutrophils Relative %: 73 %
Platelets: 152 10*3/uL (ref 150–400)
RBC: 3.67 MIL/uL — ABNORMAL LOW (ref 4.22–5.81)
RDW: 16.3 % — ABNORMAL HIGH (ref 11.5–15.5)
WBC: 9.7 10*3/uL (ref 4.0–10.5)
nRBC: 0.2 % (ref 0.0–0.2)

## 2020-03-03 LAB — SARS CORONAVIRUS 2 BY RT PCR (HOSPITAL ORDER, PERFORMED IN ~~LOC~~ HOSPITAL LAB): SARS Coronavirus 2: NEGATIVE

## 2020-03-03 MED ORDER — PREDNISONE 20 MG PO TABS
40.0000 mg | ORAL_TABLET | Freq: Once | ORAL | Status: AC
  Administered 2020-03-03: 40 mg via ORAL
  Filled 2020-03-03: qty 2

## 2020-03-03 MED ORDER — IPRATROPIUM-ALBUTEROL 0.5-2.5 (3) MG/3ML IN SOLN
3.0000 mL | Freq: Once | RESPIRATORY_TRACT | Status: AC
  Administered 2020-03-03: 3 mL via RESPIRATORY_TRACT
  Filled 2020-03-03: qty 3

## 2020-03-03 MED ORDER — INSULIN ASPART 100 UNIT/ML ~~LOC~~ SOLN
4.0000 [IU] | Freq: Once | SUBCUTANEOUS | Status: AC
  Administered 2020-03-03: 4 [IU] via SUBCUTANEOUS
  Filled 2020-03-03: qty 1

## 2020-03-03 MED ORDER — PREDNISONE 10 MG PO TABS
ORAL_TABLET | ORAL | 0 refills | Status: DC
Start: 2020-03-03 — End: 2020-04-18

## 2020-03-03 NOTE — ED Triage Notes (Signed)
Pt to ED via POV c/o ShOB and cough. Pt states that he saw his PCP last week and was diagnosed with bronchitis. Pt states that he is not getting any better on meds. Pt spoke with nurse at his dialysis center and she told him to come be seen at ER. Pt does dialysis MWF. Last treatment was Wednesday, pt had whole treatment.

## 2020-03-03 NOTE — Progress Notes (Signed)
Subjective:  Patient ID: Ralph Peterson, male    DOB: May 25, 1966,  MRN: [id number]  Chief Complaint  Patient presents with  . Foot Ulcer    "seems to be closing up some"    54 y.o. male presents for wound care.  Patient presents with a follow-up of right submetatarsal 2 ulceration.  Patient was recently admitted to the hospital for bronchitis and was unable to follow-up with vascular doctor.  He has a appointment scheduled next week.  I will see him back afterwards to discuss further management.  The wound appears to be about the same regressing a little bit however the depth is still down to the level of the bone.  I will continue local wound care until improvement of circulation.   Review of Systems: Negative except as noted in the HPI. Denies N/V/F/Ch.  Past Medical History:  Diagnosis Date  . CAD (coronary artery disease)    a. 09/2010 Cath/PCI ([facility]): LM nl, LAD 71m, D1 80 (small), LCX 64m, OM1 30, RI 70 (small), RCA 70 (DES).  . Coronary artery disease   . COVID-19 virus infection 07/2019  . Diabetes mellitus without complication ([place])   . ESRD (end stage renal disease) ([place])   . Hyperlipidemia   . Hypertension   . Ischemic cardiomyopathy    a.  12/2011 Echo ([facility]): NL EF, mod LVH. Mild AS/MS, triv PR/TR. 07/2019 Echo: EF 25-30%, GR1 DD, inf/post HK, low nl RV fxn, mildly dil RA, triv MR, mild Ao sclerosis w/o stenosis.  . NSTEMI (non-ST elevated myocardial infarction) ([place]) 07/2019  . PAD (peripheral artery disease) ([place])    a. 03/2016 s/p PTA/DEB R SFA/popliteal/peroneal; b. 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 08/2017 Atherectomy/DEB dist L SFA/popliteal. PTA of L AT; d. 06/2018 PTA/DEB L SFA/popliteal/PT/AT; e. 01/2019 Stable ABIs.  . Sleep apnea     Current Outpatient Medications:  .  acetaminophen (TYLENOL) 500 MG tablet, Take 1,000 mg by mouth 2 (two) times daily as needed for moderate pain., Disp: , Rfl:  .  albuterol (VENTOLIN HFA) 108 (90 Base) MCG/ACT inhaler, ,  Disp: , Rfl:  .  amLODipine (NORVASC) 5 MG tablet, Hold this medication because your blood pressure has been low. (Patient not taking: Reported on 02/01/2020), Disp: , Rfl: 1 .  aspirin EC 81 MG tablet, Take 81 mg by mouth every evening., Disp: , Rfl:  .  atorvastatin (LIPITOR) 40 MG tablet, Take 40 mg by mouth every evening. , Disp: , Rfl: 1 .  carvedilol (COREG) 25 MG tablet, Take 25 mg by mouth 2 (two) times daily with a meal. , Disp: , Rfl:  .  cetirizine (ZYRTEC) 10 MG tablet, Take 10 mg by mouth every evening. , Disp: , Rfl: 5 .  clopidogrel (PLAVIX) 75 MG tablet, TAKE 1 TABLET BY MOUTH ONCE DAILY., Disp: 15 tablet, Rfl: 0 .  diphenhydrAMINE (BENADRYL) 25 MG tablet, Take 25 mg by mouth at bedtime as needed for allergies., Disp: , Rfl:  .  doxycycline (VIBRA-TABS) 100 MG tablet, Take 1 tablet (100 mg total) by mouth 2 (two) times daily. (Patient not taking: Reported on 02/01/2020), Disp: 28 tablet, Rfl: 0 .  escitalopram (LEXAPRO) 10 MG tablet, Take 10 mg by mouth daily. , Disp: , Rfl:  .  fluticasone (FLONASE) 50 MCG/ACT nasal spray, Place 2 sprays into both nostrils every evening. , Disp: , Rfl:  .  glipiZIDE (GLUCOTROL) 5 MG tablet, Take 5 mg by mouth daily before breakfast. , Disp: ,  Rfl:  .  lisinopril (PRINIVIL,ZESTRIL) 2.5 MG tablet, Take 2.5 mg by mouth every evening.  (Patient not taking: Reported on 02/01/2020), Disp: , Rfl:  .  sevelamer carbonate (RENVELA) 800 MG tablet, Take 1,600-3,200 mg by mouth See admin instructions. Take 3200 mg with meals / 1600 mg with snacks, Disp: , Rfl:   Social History   Tobacco Use  Smoking Status Former Smoker  . Types: Cigarettes  . Quit date: 2010  . Years since quitting: 11.6  Smokeless Tobacco Never Used    Allergies  Allergen Reactions  . Penicillins Rash    Has patient had a PCN reaction causing immediate rash, facial/tongue/throat swelling, SOB or lightheadedness with hypotension: Yes Has patient had a PCN reaction causing severe rash  involving mucus membranes or skin necrosis: Yes Has patient had a PCN reaction that required hospitalization: No Has patient had a PCN reaction occurring within the last 10 years: No If all of the above answers are "NO", then may proceed with Cephalosporin use.    Objective:  There were no vitals filed for this visit. There is no height or weight on file to calculate BMI. Constitutional Well developed. Well nourished.  Vascular Dorsalis pedis pulses non palpable bilaterally. Posterior tibial pulsesnon palpable bilaterally. Capillary refill normal to all digits.  No cyanosis or clubbing noted. Pedal hair growth normal.  Neurologic Normal speech. Oriented to person, place, and time. Protective sensation absent  Dermatologic Wound Location: Right submetatarsal 2 ulcer with probing down to deep tissue/muscle.  Does not probe down to bone.  No clinical signs of infection noted.  No purulent drainage was expressed.  No malodor present. Wound Base: Mixed Granular/Fibrotic Peri-wound: Calloused Exudate: Scant/small amount Serosanguinous exudate Wound Measurements: -See below  Orthopedic: No pain to palpation either foot.   Radiographs: 2 views of skeletally mature adult right foot: No osteomyelitic changes noted.  No other signs of cortical destruction of the metatarsal head noted.  No soft tissue emphysema noted.  Third digit amputation noted.  Assessment:   1. Diabetes mellitus due to underlying condition with diabetic polyneuropathy, unspecified whether long term insulin use ([place])   2. Ulcer of right foot with muscle involvement without evidence of necrosis [facility])    Plan:  Patient was evaluated and treated and all questions answered.  Ulcer right submetatarsal 2 ulceration with probing down to deep tissue/capsule/muscle~stagnant -Debridement as below. -Dressed with Betadine wet-to-dry, DSD. -Continue off-loading with surgical shoe. -Surgical shoe was dispensed -I discussed with  the patient that given that the location of the ulceration patient a high risk of losing that digit as well as then further amputation.  I am worried that patient may end up losing her metatarsal head if the wound continues to progress the way it is.  Patient will continue to offload her right away. -Patient underwent vascular angiogram with intervention and had multiple stents placed to the right lower extremity.  Based on previous vascular noted appears that patient has the most dominant flow via the peroneal.  There is multiple blockages in the anterior tibial.  And patient does not have adequate supply from the posterior tibial at all. -Patient scheduled to follow-up with vascular and I would like for them to see if there is any further intervention to open the arteries.  Patient still does not have enough bleeding as when I attempted to debridement there is not much bleeding associated with it. -Continue doxycycline -I also believe patient will benefit from a graft application once there is  enough soft tissue coverage over the bone.  I discussed this with the patient patient agrees with the plan. -I will work on tides medical graft approval  Procedure: Excisional Debridement of Wound~stagnant Tool: Sharp chisel blade/tissue nipper Rationale: Removal of non-viable soft tissue from the wound to promote healing.  Anesthesia: none Pre-Debridement Wound Measurements: 1.5 cm x 1.4 cm x 0.6 cm  Post-Debridement Wound Measurements: 1.7 cm x 1.4 cm x 0.6 cm  Type of Debridement: Sharp Excisional Tissue Removed: Non-viable soft tissue Blood loss: Minimal (<50cc) Depth of Debridement: subcutaneous tissue. Technique: Sharp excisional debridement to bleeding, viable wound base.  Wound Progress: The wound is staying the same.  It is currently stagnation. Site healing conversation 7 Dressing: Dry, sterile, compression dressing. Disposition: Patient tolerated procedure well. Patient to return in 1 week for  follow-up.  No follow-ups on file.

## 2020-03-03 NOTE — ED Provider Notes (Signed)
[facility] Emergency Department Provider Note    First MD Initiated Contact with Patient 03/03/20 1045     (approximate)  I have reviewed the triage vital signs and the nursing notes.   HISTORY  Chief Complaint Shortness of Breath and Cough    HPI Ralph Peterson is a 54 y.o. male with the below listed past medical history presents to the ER for evaluation of worsening cough congestion and shortness of breath.  States he was recently treated with steroids doxycycline and albuterol for acute bronchitis.  Felt better after being put on steroids and then after he tapered off started feeling unwell again having frequent cough and exertional dyspnea.  Denies any chest pain.  No pressure.  No pleuritic chest discomfort.  No fevers.  Has been compliant with his dialysis and home medications.    Past Medical History:  Diagnosis Date  . CAD (coronary artery disease)    a. 09/2010 Cath/PCI ([facility]): LM nl, LAD 32m, D1 80 (small), LCX 6m, OM1 30, RI 70 (small), RCA 70 (DES).  . Coronary artery disease   . COVID-19 virus infection 07/2019  . Diabetes mellitus without complication ([place])   . ESRD (end stage renal disease) ([place])   . Hyperlipidemia   . Hypertension   . Ischemic cardiomyopathy    a.  12/2011 Echo ([facility]): NL EF, mod LVH. Mild AS/MS, triv PR/TR. 07/2019 Echo: EF 25-30%, GR1 DD, inf/post HK, low nl RV fxn, mildly dil RA, triv MR, mild Ao sclerosis w/o stenosis.  . NSTEMI (non-ST elevated myocardial infarction) ([place]) 07/2019  . PAD (peripheral artery disease) ([place])    a. 03/2016 s/p PTA/DEB R SFA/popliteal/peroneal; b. 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 08/2017 Atherectomy/DEB dist L SFA/popliteal. PTA of L AT; d. 06/2018 PTA/DEB L SFA/popliteal/PT/AT; e. 01/2019 Stable ABIs.  . Sleep apnea    Family History  Problem Relation Age of Onset  . Hypertension Mother    Past Surgical History:  Procedure Laterality Date  . ABDOMINAL AORTOGRAM N/A 09/10/2017     Procedure: ABDOMINAL AORTOGRAM;  Surgeon: Wellington Hampshire, MD;  Location: [place] CV LAB;  Service: Cardiovascular;  Laterality: N/A;  . AMPUTATION TOE Bilateral    one toe on right, all five toes on the left  . CARDIAC CATHETERIZATION  2012   [facility]; X1 stent 2.5 x 33 mm xience stent Distal TCA  . LOWER EXTREMITY ANGIOGRAPHY Bilateral 09/10/2017   Procedure: Lower Extremity Angiography;  Surgeon: Wellington Hampshire, MD;  Location: [place] CV LAB;  Service: Cardiovascular;  Laterality: Bilateral;  . LOWER EXTREMITY ANGIOGRAPHY Left 06/23/2018   Procedure: LOWER EXTREMITY ANGIOGRAPHY;  Surgeon: Katha Cabal, MD;  Location: [place] CV LAB;  Service: Cardiovascular;  Laterality: Left;  . LOWER EXTREMITY ANGIOGRAPHY Right 02/01/2020   Procedure: LOWER EXTREMITY ANGIOGRAPHY;  Surgeon: Katha Cabal, MD;  Location: [place] CV LAB;  Service: Cardiovascular;  Laterality: Right;  . PERIPHERAL VASCULAR ATHERECTOMY Left 09/10/2017   Procedure: PERIPHERAL VASCULAR ATHERECTOMY;  Surgeon: Wellington Hampshire, MD;  Location: [place] [facility];  Service: Cardiovascular;  Laterality: Left;  SFA/POPLITEAL  . PERIPHERAL VASCULAR BALLOON ANGIOPLASTY Left 09/10/2017   Procedure: PERIPHERAL VASCULAR BALLOON ANGIOPLASTY;  Surgeon: Wellington Hampshire, MD;  Location: [place] CV LAB;  Service: Cardiovascular;  Laterality: Left;  ANTERIAL TIBIAL  . PERIPHERAL VASCULAR CATHETERIZATION Right 03/26/2016   Procedure: Lower Extremity Angiography;  Surgeon: Katha Cabal, MD;  Location: [facility] INVASIVE CV  LAB;  Service: Cardiovascular;  Laterality: Right;  . PERIPHERAL VASCULAR INTERVENTION Left 09/10/2017   Procedure: PERIPHERAL VASCULAR INTERVENTION;  Surgeon: Wellington Hampshire, MD;  Location: [place] CV LAB;  Service: Cardiovascular;  Laterality: Left;  SFA/POPLITEAL   Patient Active Problem List   Diagnosis Date Noted  . COVID-19 07/27/2019  . Shortness of breath  07/27/2019  . Elevated troponin 07/27/2019  . Acute congestive heart failure ([place])   . Elevated lactic acid level   . Complication of vascular access for dialysis 07/30/2018  . Atherosclerosis of native arteries of extremity with rest pain ([place]) 06/16/2018  . Dialysis patient ([place]) 09/10/2016  . Toe gangrene ([place]) 03/20/2016  . CAD (coronary artery disease) 11/07/2011  . Diabetes mellitus ([place]) 11/07/2011  . ESRD (end stage renal disease) ([place]) 11/07/2011  . Hyperlipidemia, unspecified 11/07/2011  . Hypertension 11/07/2011  . Stented coronary artery 11/07/2011      Prior to Admission medications   Medication Sig Start Date End Date Taking? Authorizing Provider  acetaminophen (TYLENOL) 500 MG tablet Take 1,000 mg by mouth 2 (two) times daily as needed for moderate pain.    [provider]  albuterol (VENTOLIN HFA) 108 (90 Base) MCG/ACT inhaler  02/24/20   [provider]  amLODipine (NORVASC) 5 MG tablet Hold this medication because your blood pressure has been low. Patient not taking: Reported on 02/01/2020 07/29/19   Enzo Bi, MD  aspirin EC 81 MG tablet Take 81 mg by mouth every evening.    [provider]  atorvastatin (LIPITOR) 40 MG tablet Take 40 mg by mouth every evening.  03/31/18   [provider]  carvedilol (COREG) 25 MG tablet Take 25 mg by mouth 2 (two) times daily with a meal.  06/18/13   [provider]  cetirizine (ZYRTEC) 10 MG tablet Take 10 mg by mouth every evening.  05/21/18   [provider]  clopidogrel (PLAVIX) 75 MG tablet TAKE 1 TABLET BY MOUTH ONCE DAILY. 10/20/19   Minna Merritts, MD  diphenhydrAMINE (BENADRYL) 25 MG tablet Take 25 mg by mouth at bedtime as needed for allergies.    [provider]  doxycycline (VIBRA-TABS) 100 MG tablet Take 1 tablet (100 mg total) by mouth 2 (two) times daily. Patient not taking: Reported on 02/01/2020 01/11/20   Felipa Furnace, DPM  escitalopram (LEXAPRO) 10 MG  tablet Take 10 mg by mouth daily.     [provider]  fluticasone (FLONASE) 50 MCG/ACT nasal spray Place 2 sprays into both nostrils every evening.     [provider]  glipiZIDE (GLUCOTROL) 5 MG tablet Take 5 mg by mouth daily before breakfast.  01/01/19   [provider]  lisinopril (PRINIVIL,ZESTRIL) 2.5 MG tablet Take 2.5 mg by mouth every evening.  Patient not taking: Reported on 02/01/2020    [provider]  predniSONE (DELTASONE) 10 MG tablet Day 1-2: Take 50mg (5 pills) Day 3-4: Take 40mg (4) Day 5-6: 30mg (3) Day 7-8: 20mg (2) Day 9:10mg (1 03/03/20   Merlyn Lot, MD  sevelamer carbonate (RENVELA) 800 MG tablet Take 1,600-3,200 mg by mouth See admin instructions. Take 3200 mg with meals / 1600 mg with snacks    [provider]    Allergies Penicillins    Social History Social History   Tobacco Use  . Smoking status: Former Smoker    Types: Cigarettes    Quit date: 2010    Years since quitting: 11.6  . Smokeless tobacco: Never Used  Vaping Use  .  Vaping Use: Never used  Substance Use Topics  . Alcohol use: Not Currently  . Drug use: No    Review of Systems Patient denies headaches, rhinorrhea, blurry vision, numbness, shortness of breath, chest pain, edema, cough, abdominal pain, nausea, vomiting, diarrhea, dysuria, fevers, rashes or hallucinations unless otherwise stated above in HPI. ____________________________________________   PHYSICAL EXAM:  VITAL SIGNS: Vitals:   03/03/20 0755 03/03/20 1052  BP:  128/90  Pulse:  85  Resp: 18 17  Temp:    SpO2:  95%    Constitutional: Alert and oriented.  Eyes: Conjunctivae are normal.  Head: Atraumatic. Nose: No congestion/rhinnorhea. Mouth/Throat: Mucous membranes are moist.   Neck: No stridor. Painless ROM.  Cardiovascular: Normal rate, regular rhythm. Grossly normal heart sounds.  Good peripheral circulation. Respiratory: Normal respiratory effort.  No retractions.  Lungs CTAB. Gastrointestinal: Soft and nontender. No distention. No abdominal bruits. No CVA tenderness. Genitourinary:  Musculoskeletal: No lower extremity tenderness nor edema.  No joint effusions. Neurologic:  Normal speech and language. No gross focal neurologic deficits are appreciated. No facial droop Skin:  Skin is warm, dry and intact. No rash noted. Psychiatric: Mood and affect are normal. Speech and behavior are normal.  ____________________________________________   LABS (all labs ordered are listed, but only abnormal results are displayed)  Results for orders placed or performed during the hospital encounter of 03/03/20 (from the past 24 hour(s))  CBC with Differential     Status: Abnormal   Collection Time: 03/03/20  8:04 AM  Result Value Ref Range   WBC 9.7 4.0 - 10.5 K/uL   RBC 3.67 (L) 4.22 - 5.81 MIL/uL   Hemoglobin 10.9 (L) 13.0 - 17.0 g/dL   HCT 33.0 (L) 39 - 52 %   MCV 89.9 80.0 - 100.0 fL   MCH 29.7 26.0 - 34.0 pg   MCHC 33.0 30.0 - 36.0 g/dL   RDW 16.3 (H) 11.5 - 15.5 %   Platelets 152 150 - 400 K/uL   nRBC 0.2 0.0 - 0.2 %   Neutrophils Relative % 73 %   Neutro Abs 7.0 1.7 - 7.7 K/uL   Lymphocytes Relative 15 %   Lymphs Abs 1.5 0.7 - 4.0 K/uL   Monocytes Relative 8 %   Monocytes Absolute 0.8 0 - 1 K/uL   Eosinophils Relative 1 %   Eosinophils Absolute 0.1 0 - 0 K/uL   Basophils Relative 1 %   Basophils Absolute 0.1 0 - 0 K/uL   Immature Granulocytes 2 %   Abs Immature Granulocytes 0.18 (H) 0.00 - 0.07 K/uL  Basic metabolic panel     Status: Abnormal   Collection Time: 03/03/20  8:04 AM  Result Value Ref Range   Sodium 130 (L) 135 - 145 mmol/L   Potassium 4.3 3.5 - 5.1 mmol/L   Chloride 87 (L) 98 - 111 mmol/L   CO2 25 22 - 32 mmol/L   Glucose, Bld 377 (H) 70 - 99 mg/dL   BUN 62 (H) 6 - 20 mg/dL   Creatinine, Ser 7.65 (H) 0.61 - 1.24 mg/dL   Calcium 8.3 (L) 8.9 - 10.3 mg/dL   GFR calc non Af Amer 7 (L) >60 mL/min   GFR calc Af Amer 9 (L) >60  mL/min   Anion gap 18 (H) 5 - 15  SARS Coronavirus 2 by RT PCR (hospital order, performed in [place] hospital lab) Nasopharyngeal Nasopharyngeal Swab     Status: None   Collection Time: 03/03/20 11:02 AM  Specimen: Nasopharyngeal Swab  Result Value Ref Range   SARS Coronavirus 2 NEGATIVE NEGATIVE   ____________________________________________  EKG My review and personal interpretation at Time: 7:55   Indication: cough  Rate: 80  Rhythm: sinus Axis: left Other: nonspecific st abn, rbbb, c/w previous ____________________________________________  RADIOLOGY  I personally reviewed all radiographic images ordered to evaluate for the above acute complaints and reviewed radiology reports and findings.  These findings were personally discussed with the patient.  Please see medical record for radiology report.  ____________________________________________   PROCEDURES  Procedure(s) performed:  Procedures    Critical Care performed: no ____________________________________________   INITIAL IMPRESSION / ASSESSMENT AND PLAN / ED COURSE  Pertinent labs & imaging results that were available during my care of the patient were reviewed by me and considered in my medical decision making (see chart for details).   DDX: Asthma, copd, CHF, pna, ptx, malignancy, Pe, anemia   BRAYN ECKSTEIN is a 54 y.o. who presents to the ED with symptoms as described above.  Patient is nontoxic-appearing no acute distress.  No hypoxia.  Chest x-ray without any evidence of edema no effusions.  No signs of infectious process.  COVID-19 repeat testing is negative and patient is vaccinated.  Was given albuterol neb with improvement in symptoms.  Felt like his symptoms got worse after he recently stopped prednisone burst.  I suspect this is recurrent bronchitis.  Does not seem consistent with PE.  No signs of acute failure.  He is hyperglycemic but normal bicarb will give dose of subcu insulin.  No symptoms  or findings to suggest DKA or HHS.  Will be appropriate for further follow-up with PCP regarding hyperglycemia.  He is due for eye dialysis today in clinic should be out of work come in this afternoon if not tomorrow morning which I do believe he is appropriate for.  Have discussed with the patient and available family all diagnostics and treatments performed thus far and all questions were answered to the best of my ability. The patient demonstrates understanding and agreement with plan.      The patient was evaluated in Emergency Department today for the symptoms described in the history of present illness. He/she was evaluated in the context of the global COVID-19 pandemic, which necessitated consideration that the patient might be at risk for infection with the SARS-CoV-2 virus that causes COVID-19. Institutional protocols and algorithms that pertain to the evaluation of patients at risk for COVID-19 are in a state of rapid change based on information released by regulatory bodies including the CDC and federal and state organizations. These policies and algorithms were followed during the patient's care in the ED.  As part of my medical decision making, I reviewed the following data within the [place] notes reviewed and incorporated, Labs reviewed, notes from prior ED visits and [place] Controlled Substance Database   ____________________________________________   FINAL CLINICAL IMPRESSION(S) / ED DIAGNOSES  Final diagnoses:  Bronchitis      NEW MEDICATIONS STARTED DURING THIS VISIT:  New Prescriptions   PREDNISONE (DELTASONE) 10 MG TABLET    Day 1-2: Take 50mg (5 pills) Day 3-4: Take 40mg (4) Day 5-6: 30mg (3) Day 7-8: 20mg (2) Day 9:10mg (1     Note:  This document was prepared using Dragon voice recognition software and may include unintentional dictation errors.    Merlyn Lot, MD 03/03/20 1218

## 2020-03-07 ENCOUNTER — Other Ambulatory Visit: Payer: Self-pay

## 2020-03-07 ENCOUNTER — Inpatient Hospital Stay
Admission: EM | Admit: 2020-03-07 | Discharge: 2020-03-10 | DRG: 189 | Disposition: A | Discharge status: Home / Self Care | Payer: Medicare PPO | Attending: Family Medicine | Admitting: Family Medicine

## 2020-03-07 ENCOUNTER — Emergency Department: Payer: Medicare PPO

## 2020-03-07 ENCOUNTER — Inpatient Hospital Stay
Admit: 2020-03-07 | Discharge: 2020-03-07 | Disposition: A | Discharge status: Home / Self Care | Payer: Medicare PPO | Attending: Pulmonary Disease | Admitting: Pulmonary Disease

## 2020-03-07 DIAGNOSIS — E1100 Type 2 diabetes mellitus with hyperosmolarity without nonketotic hyperglycemic-hyperosmolar coma (NKHHC): Secondary | ICD-10-CM | POA: Diagnosis present

## 2020-03-07 DIAGNOSIS — I4891 Unspecified atrial fibrillation: Secondary | ICD-10-CM

## 2020-03-07 DIAGNOSIS — Z95820 Peripheral vascular angioplasty status with implants and grafts: Secondary | ICD-10-CM | POA: Diagnosis not present

## 2020-03-07 DIAGNOSIS — E1122 Type 2 diabetes mellitus with diabetic chronic kidney disease: Secondary | ICD-10-CM | POA: Diagnosis present

## 2020-03-07 DIAGNOSIS — E669 Obesity, unspecified: Secondary | ICD-10-CM | POA: Diagnosis present

## 2020-03-07 DIAGNOSIS — I5021 Acute systolic (congestive) heart failure: Secondary | ICD-10-CM

## 2020-03-07 DIAGNOSIS — I083 Combined rheumatic disorders of mitral, aortic and tricuspid valves: Secondary | ICD-10-CM | POA: Diagnosis present

## 2020-03-07 DIAGNOSIS — I2510 Atherosclerotic heart disease of native coronary artery without angina pectoris: Secondary | ICD-10-CM | POA: Diagnosis present

## 2020-03-07 DIAGNOSIS — N186 End stage renal disease: Secondary | ICD-10-CM

## 2020-03-07 DIAGNOSIS — E1151 Type 2 diabetes mellitus with diabetic peripheral angiopathy without gangrene: Secondary | ICD-10-CM | POA: Diagnosis present

## 2020-03-07 DIAGNOSIS — Z955 Presence of coronary angioplasty implant and graft: Secondary | ICD-10-CM

## 2020-03-07 DIAGNOSIS — L89151 Pressure ulcer of sacral region, stage 1: Secondary | ICD-10-CM | POA: Diagnosis present

## 2020-03-07 DIAGNOSIS — Z8616 Personal history of COVID-19: Secondary | ICD-10-CM

## 2020-03-07 DIAGNOSIS — E119 Type 2 diabetes mellitus without complications: Secondary | ICD-10-CM

## 2020-03-07 DIAGNOSIS — E11621 Type 2 diabetes mellitus with foot ulcer: Secondary | ICD-10-CM | POA: Diagnosis present

## 2020-03-07 DIAGNOSIS — I132 Hypertensive heart and chronic kidney disease with heart failure and with stage 5 chronic kidney disease, or end stage renal disease: Secondary | ICD-10-CM | POA: Diagnosis present

## 2020-03-07 DIAGNOSIS — I255 Ischemic cardiomyopathy: Secondary | ICD-10-CM | POA: Diagnosis present

## 2020-03-07 DIAGNOSIS — Z89422 Acquired absence of other left toe(s): Secondary | ICD-10-CM

## 2020-03-07 DIAGNOSIS — Z992 Dependence on renal dialysis: Secondary | ICD-10-CM

## 2020-03-07 DIAGNOSIS — I10 Essential (primary) hypertension: Secondary | ICD-10-CM | POA: Diagnosis present

## 2020-03-07 DIAGNOSIS — Z7984 Long term (current) use of oral hypoglycemic drugs: Secondary | ICD-10-CM

## 2020-03-07 DIAGNOSIS — I252 Old myocardial infarction: Secondary | ICD-10-CM | POA: Diagnosis not present

## 2020-03-07 DIAGNOSIS — I472 Ventricular tachycardia: Secondary | ICD-10-CM | POA: Diagnosis present

## 2020-03-07 DIAGNOSIS — Z8249 Family history of ischemic heart disease and other diseases of the circulatory system: Secondary | ICD-10-CM

## 2020-03-07 DIAGNOSIS — E785 Hyperlipidemia, unspecified: Secondary | ICD-10-CM | POA: Diagnosis present

## 2020-03-07 DIAGNOSIS — J9601 Acute respiratory failure with hypoxia: Secondary | ICD-10-CM | POA: Diagnosis not present

## 2020-03-07 DIAGNOSIS — R7989 Other specified abnormal findings of blood chemistry: Secondary | ICD-10-CM

## 2020-03-07 DIAGNOSIS — L97509 Non-pressure chronic ulcer of other part of unspecified foot with unspecified severity: Secondary | ICD-10-CM | POA: Diagnosis present

## 2020-03-07 DIAGNOSIS — Z7902 Long term (current) use of antithrombotics/antiplatelets: Secondary | ICD-10-CM

## 2020-03-07 DIAGNOSIS — R0602 Shortness of breath: Secondary | ICD-10-CM | POA: Diagnosis present

## 2020-03-07 DIAGNOSIS — F329 Major depressive disorder, single episode, unspecified: Secondary | ICD-10-CM | POA: Diagnosis present

## 2020-03-07 DIAGNOSIS — I4892 Unspecified atrial flutter: Secondary | ICD-10-CM | POA: Diagnosis present

## 2020-03-07 DIAGNOSIS — I5020 Unspecified systolic (congestive) heart failure: Secondary | ICD-10-CM

## 2020-03-07 DIAGNOSIS — I272 Pulmonary hypertension, unspecified: Secondary | ICD-10-CM | POA: Diagnosis present

## 2020-03-07 DIAGNOSIS — D631 Anemia in chronic kidney disease: Secondary | ICD-10-CM | POA: Diagnosis present

## 2020-03-07 DIAGNOSIS — G4733 Obstructive sleep apnea (adult) (pediatric): Secondary | ICD-10-CM | POA: Diagnosis present

## 2020-03-07 DIAGNOSIS — L8990 Pressure ulcer of unspecified site, unspecified stage: Secondary | ICD-10-CM | POA: Insufficient documentation

## 2020-03-07 DIAGNOSIS — Z87891 Personal history of nicotine dependence: Secondary | ICD-10-CM

## 2020-03-07 DIAGNOSIS — E1165 Type 2 diabetes mellitus with hyperglycemia: Secondary | ICD-10-CM | POA: Diagnosis present

## 2020-03-07 DIAGNOSIS — R778 Other specified abnormalities of plasma proteins: Secondary | ICD-10-CM

## 2020-03-07 DIAGNOSIS — I4729 Other ventricular tachycardia: Secondary | ICD-10-CM

## 2020-03-07 DIAGNOSIS — I5043 Acute on chronic combined systolic (congestive) and diastolic (congestive) heart failure: Secondary | ICD-10-CM | POA: Diagnosis present

## 2020-03-07 DIAGNOSIS — R9431 Abnormal electrocardiogram [ECG] [EKG]: Secondary | ICD-10-CM

## 2020-03-07 DIAGNOSIS — Z20822 Contact with and (suspected) exposure to covid-19: Secondary | ICD-10-CM | POA: Diagnosis present

## 2020-03-07 DIAGNOSIS — R739 Hyperglycemia, unspecified: Secondary | ICD-10-CM

## 2020-03-07 DIAGNOSIS — E11649 Type 2 diabetes mellitus with hypoglycemia without coma: Secondary | ICD-10-CM | POA: Diagnosis present

## 2020-03-07 DIAGNOSIS — N2581 Secondary hyperparathyroidism of renal origin: Secondary | ICD-10-CM | POA: Diagnosis present

## 2020-03-07 DIAGNOSIS — L89899 Pressure ulcer of other site, unspecified stage: Secondary | ICD-10-CM

## 2020-03-07 DIAGNOSIS — Z6833 Body mass index (BMI) 33.0-33.9, adult: Secondary | ICD-10-CM

## 2020-03-07 DIAGNOSIS — I517 Cardiomegaly: Secondary | ICD-10-CM | POA: Diagnosis present

## 2020-03-07 DIAGNOSIS — Z89421 Acquired absence of other right toe(s): Secondary | ICD-10-CM

## 2020-03-07 DIAGNOSIS — Z79899 Other long term (current) drug therapy: Secondary | ICD-10-CM

## 2020-03-07 DIAGNOSIS — Z7982 Long term (current) use of aspirin: Secondary | ICD-10-CM

## 2020-03-07 DIAGNOSIS — I509 Heart failure, unspecified: Secondary | ICD-10-CM

## 2020-03-07 DIAGNOSIS — Z7952 Long term (current) use of systemic steroids: Secondary | ICD-10-CM

## 2020-03-07 LAB — SARS CORONAVIRUS 2 BY RT PCR (HOSPITAL ORDER, PERFORMED IN ~~LOC~~ HOSPITAL LAB): SARS Coronavirus 2: NEGATIVE

## 2020-03-07 LAB — TROPONIN I (HIGH SENSITIVITY)
Troponin I (High Sensitivity): 978 ng/L (ref <18)
Troponin I (High Sensitivity): 2030 ng/L (ref <18)

## 2020-03-07 LAB — CBC WITH DIFFERENTIAL/PLATELET
Abs Immature Granulocytes: 0.24 10*3/uL — ABNORMAL HIGH (ref 0.00–0.07)
Basophils Absolute: 0 10*3/uL (ref 0.0–0.1)
Basophils Relative: 0 %
Eosinophils Absolute: 0.1 10*3/uL (ref 0.0–0.5)
Eosinophils Relative: 0 %
HCT: 36.9 % — ABNORMAL LOW (ref 39.0–52.0)
Hemoglobin: 11.3 g/dL — ABNORMAL LOW (ref 13.0–17.0)
Immature Granulocytes: 2 %
Lymphocytes Relative: 14 %
Lymphs Abs: 1.7 10*3/uL (ref 0.7–4.0)
MCH: 29.2 pg (ref 26.0–34.0)
MCHC: 30.6 g/dL (ref 30.0–36.0)
MCV: 95.3 fL (ref 80.0–100.0)
Monocytes Absolute: 0.8 10*3/uL (ref 0.1–1.0)
Monocytes Relative: 7 %
Neutro Abs: 8.9 10*3/uL — ABNORMAL HIGH (ref 1.7–7.7)
Neutrophils Relative %: 77 %
Platelets: 190 10*3/uL (ref 150–400)
RBC: 3.87 MIL/uL — ABNORMAL LOW (ref 4.22–5.81)
RDW: 16.1 % — ABNORMAL HIGH (ref 11.5–15.5)
WBC: 11.7 10*3/uL — ABNORMAL HIGH (ref 4.0–10.5)
nRBC: 0.2 % (ref 0.0–0.2)

## 2020-03-07 LAB — GLUCOSE, CAPILLARY
Glucose-Capillary: 109 mg/dL — ABNORMAL HIGH (ref 70–99)
Glucose-Capillary: 125 mg/dL — ABNORMAL HIGH (ref 70–99)
Glucose-Capillary: 127 mg/dL — ABNORMAL HIGH (ref 70–99)
Glucose-Capillary: 144 mg/dL — ABNORMAL HIGH (ref 70–99)
Glucose-Capillary: 156 mg/dL — ABNORMAL HIGH (ref 70–99)
Glucose-Capillary: 156 mg/dL — ABNORMAL HIGH (ref 70–99)
Glucose-Capillary: 165 mg/dL — ABNORMAL HIGH (ref 70–99)
Glucose-Capillary: 166 mg/dL — ABNORMAL HIGH (ref 70–99)
Glucose-Capillary: 236 mg/dL — ABNORMAL HIGH (ref 70–99)
Glucose-Capillary: 256 mg/dL — ABNORMAL HIGH (ref 70–99)
Glucose-Capillary: 415 mg/dL — ABNORMAL HIGH (ref 70–99)
Glucose-Capillary: 509 mg/dL (ref 70–99)
Glucose-Capillary: 521 mg/dL (ref 70–99)
Glucose-Capillary: 68 mg/dL — ABNORMAL LOW (ref 70–99)
Glucose-Capillary: 78 mg/dL (ref 70–99)
Glucose-Capillary: >600 mg/dL (ref 70–99)
Glucose-Capillary: >600 mg/dL (ref 70–99)

## 2020-03-07 LAB — PHOSPHORUS: Phosphorus: 3.3 mg/dL (ref 2.5–4.6)

## 2020-03-07 LAB — BASIC METABOLIC PANEL
Anion gap: 17 — ABNORMAL HIGH (ref 5–15)
Anion gap: 17 — ABNORMAL HIGH (ref 5–15)
Anion gap: 19 — ABNORMAL HIGH (ref 5–15)
BUN: 45 mg/dL — ABNORMAL HIGH (ref 6–20)
BUN: 47 mg/dL — ABNORMAL HIGH (ref 6–20)
BUN: 49 mg/dL — ABNORMAL HIGH (ref 6–20)
CO2: 25 mmol/L (ref 22–32)
CO2: 27 mmol/L (ref 22–32)
CO2: 25 mmol/L (ref 22–32)
Calcium: 8.5 mg/dL — ABNORMAL LOW (ref 8.9–10.3)
Calcium: 8.5 mg/dL — ABNORMAL LOW (ref 8.9–10.3)
Calcium: 8.9 mg/dL (ref 8.9–10.3)
Chloride: 91 mmol/L — ABNORMAL LOW (ref 98–111)
Chloride: 89 mmol/L — ABNORMAL LOW (ref 98–111)
Chloride: 88 mmol/L — ABNORMAL LOW (ref 98–111)
Creatinine, Ser: 5.43 mg/dL — ABNORMAL HIGH (ref 0.61–1.24)
Creatinine, Ser: 5.63 mg/dL — ABNORMAL HIGH (ref 0.61–1.24)
Creatinine, Ser: 6.2 mg/dL — ABNORMAL HIGH (ref 0.61–1.24)
GFR calc Af Amer: 13 mL/min — ABNORMAL LOW (ref >60)
GFR calc Af Amer: 12 mL/min — ABNORMAL LOW (ref >60)
GFR calc Af Amer: 11 mL/min — ABNORMAL LOW (ref >60)
GFR calc non Af Amer: 11 mL/min — ABNORMAL LOW (ref >60)
GFR calc non Af Amer: 11 mL/min — ABNORMAL LOW (ref >60)
GFR calc non Af Amer: 9 mL/min — ABNORMAL LOW (ref >60)
Glucose, Bld: 207 mg/dL — ABNORMAL HIGH (ref 70–99)
Glucose, Bld: 160 mg/dL — ABNORMAL HIGH (ref 70–99)
Glucose, Bld: 87 mg/dL (ref 70–99)
Potassium: 3.5 mmol/L (ref 3.5–5.1)
Potassium: 4.3 mmol/L (ref 3.5–5.1)
Potassium: 4.3 mmol/L (ref 3.5–5.1)
Sodium: 133 mmol/L — ABNORMAL LOW (ref 135–145)
Sodium: 133 mmol/L — ABNORMAL LOW (ref 135–145)
Sodium: 132 mmol/L — ABNORMAL LOW (ref 135–145)

## 2020-03-07 LAB — BLOOD GAS, VENOUS
Acid-base deficit: 0.3 mmol/L (ref 0.0–2.0)
Bicarbonate: 26.3 mmol/L (ref 20.0–28.0)
O2 Saturation: 55.5 %
Patient temperature: 37
pCO2, Ven: 51 mmHg (ref 44.0–60.0)
pH, Ven: 7.32 (ref 7.250–7.430)
pO2, Ven: 32 mmHg (ref 32.0–45.0)

## 2020-03-07 LAB — COMPREHENSIVE METABOLIC PANEL
ALT: 37 U/L (ref 0–44)
AST: 20 U/L (ref 15–41)
Albumin: 3.2 g/dL — ABNORMAL LOW (ref 3.5–5.0)
Alkaline Phosphatase: 169 U/L — ABNORMAL HIGH (ref 38–126)
Anion gap: 16 — ABNORMAL HIGH (ref 5–15)
BUN: 41 mg/dL — ABNORMAL HIGH (ref 6–20)
CO2: 26 mmol/L (ref 22–32)
Calcium: 8.2 mg/dL — ABNORMAL LOW (ref 8.9–10.3)
Chloride: 83 mmol/L — ABNORMAL LOW (ref 98–111)
Creatinine, Ser: 5.26 mg/dL — ABNORMAL HIGH (ref 0.61–1.24)
GFR calc Af Amer: 13 mL/min — ABNORMAL LOW (ref >60)
GFR calc non Af Amer: 12 mL/min — ABNORMAL LOW (ref >60)
Glucose, Bld: 792 mg/dL (ref 70–99)
Potassium: 4.5 mmol/L (ref 3.5–5.1)
Sodium: 125 mmol/L — ABNORMAL LOW (ref 135–145)
Total Bilirubin: 1.1 mg/dL (ref 0.3–1.2)
Total Protein: 6.1 g/dL — ABNORMAL LOW (ref 6.5–8.1)

## 2020-03-07 LAB — HEPARIN LEVEL (UNFRACTIONATED): Heparin Unfractionated: 0.61 IU/mL (ref 0.30–0.70)

## 2020-03-07 LAB — BETA-HYDROXYBUTYRIC ACID: Beta-Hydroxybutyric Acid: 0.08 mmol/L (ref 0.05–0.27)

## 2020-03-07 LAB — PROTIME-INR
INR: 1.5 — ABNORMAL HIGH (ref 0.8–1.2)
Prothrombin Time: 17.6 seconds — ABNORMAL HIGH (ref 11.4–15.2)

## 2020-03-07 LAB — APTT: aPTT: 84 seconds — ABNORMAL HIGH (ref 24–36)

## 2020-03-07 LAB — MAGNESIUM: Magnesium: 2.4 mg/dL (ref 1.7–2.4)

## 2020-03-07 LAB — TSH: TSH: 5.305 u[IU]/mL — ABNORMAL HIGH (ref 0.350–4.500)

## 2020-03-07 LAB — MRSA PCR SCREENING: MRSA by PCR: NEGATIVE

## 2020-03-07 LAB — PROCALCITONIN: Procalcitonin: 0.35 ng/mL

## 2020-03-07 LAB — BRAIN NATRIURETIC PEPTIDE: B Natriuretic Peptide: 3819.1 pg/mL — ABNORMAL HIGH (ref 0.0–100.0)

## 2020-03-07 MED ORDER — ORAL CARE MOUTH RINSE
15.0000 mL | Freq: Two times a day (BID) | OROMUCOSAL | Status: DC
  Administered 2020-03-07 – 2020-03-10 (×5): 15 mL via OROMUCOSAL

## 2020-03-07 MED ORDER — ALBUTEROL SULFATE (2.5 MG/3ML) 0.083% IN NEBU
INHALATION_SOLUTION | RESPIRATORY_TRACT | Status: AC
  Filled 2020-03-07: qty 6

## 2020-03-07 MED ORDER — INSULIN DETEMIR 100 UNIT/ML ~~LOC~~ SOLN
5.0000 [IU] | Freq: Two times a day (BID) | SUBCUTANEOUS | Status: DC
  Administered 2020-03-08 – 2020-03-10 (×5): 5 [IU] via SUBCUTANEOUS
  Filled 2020-03-07 (×7): qty 0.05

## 2020-03-07 MED ORDER — MAGNESIUM SULFATE 2 GM/50ML IV SOLN: 2.0000 g | Freq: Once | INTRAVENOUS | Status: AC

## 2020-03-07 MED ORDER — DEXTROSE IN LACTATED RINGERS 5 % IV SOLN: INTRAVENOUS | Status: DC

## 2020-03-07 MED ORDER — LACTATED RINGERS IV SOLN: INTRAVENOUS | Status: DC

## 2020-03-07 MED ORDER — AMIODARONE IV BOLUS ONLY 150 MG/100ML: 150.0000 mg | Freq: Once | INTRAVENOUS | Status: AC

## 2020-03-07 MED ORDER — DEXTROSE 50 % IV SOLN: 0.0000 mL | INTRAVENOUS | Status: DC | PRN

## 2020-03-07 MED ORDER — ATROPINE SULFATE 1 MG/10ML IJ SOSY
PREFILLED_SYRINGE | INTRAMUSCULAR | Status: AC
  Filled 2020-03-07: qty 10

## 2020-03-07 MED ORDER — DIGOXIN 0.25 MG/ML IJ SOLN
0.2500 mg | Freq: Once | INTRAMUSCULAR | Status: AC
  Administered 2020-03-07: 0.25 mg via INTRAVENOUS
  Filled 2020-03-07: qty 2

## 2020-03-07 MED ORDER — HYDRALAZINE HCL 20 MG/ML IJ SOLN: 10.0000 mg | INTRAMUSCULAR | Status: DC | PRN

## 2020-03-07 MED ORDER — METOPROLOL TARTRATE 5 MG/5ML IV SOLN
INTRAVENOUS | Status: AC
  Administered 2020-03-07: 5 mg via INTRAVENOUS
  Filled 2020-03-07: qty 5

## 2020-03-07 MED ORDER — INSULIN REGULAR(HUMAN) IN NACL 100-0.9 UT/100ML-% IV SOLN
INTRAVENOUS | Status: DC
  Administered 2020-03-07: 1.6 [IU]/h via INTRAVENOUS
  Administered 2020-03-07: 14 [IU]/h via INTRAVENOUS
  Filled 2020-03-07: qty 100

## 2020-03-07 MED ORDER — DIGOXIN 0.25 MG/ML IJ SOLN
0.5000 mg | Freq: Once | INTRAMUSCULAR | Status: AC
  Administered 2020-03-07: 0.5 mg via INTRAVENOUS
  Filled 2020-03-07: qty 2

## 2020-03-07 MED ORDER — AMIODARONE HCL IN DEXTROSE 360-4.14 MG/200ML-% IV SOLN
INTRAVENOUS | Status: AC
  Administered 2020-03-07: 60 mg/h via INTRAVENOUS
  Filled 2020-03-07: qty 200

## 2020-03-07 MED ORDER — DEXTROSE-NACL 5-0.9 % IV SOLN: INTRAVENOUS | Status: DC

## 2020-03-07 MED ORDER — POTASSIUM CHLORIDE CRYS ER 20 MEQ PO TBCR
20.0000 meq | EXTENDED_RELEASE_TABLET | Freq: Once | ORAL | Status: AC
  Administered 2020-03-07: 20 meq via ORAL
  Filled 2020-03-07: qty 1

## 2020-03-07 MED ORDER — PERFLUTREN LIPID MICROSPHERE
1.0000 mL | INTRAVENOUS | Status: AC | PRN
  Administered 2020-03-07: 2 mL via INTRAVENOUS
  Filled 2020-03-07: qty 10

## 2020-03-07 MED ORDER — METOPROLOL TARTRATE 5 MG/5ML IV SOLN
5.0000 mg | INTRAVENOUS | Status: DC | PRN
  Administered 2020-03-07 (×3): 5 mg via INTRAVENOUS
  Filled 2020-03-07 (×3): qty 5

## 2020-03-07 MED ORDER — DILTIAZEM HCL 25 MG/5ML IV SOLN
2.5000 mg | Freq: Once | INTRAVENOUS | Status: AC
  Administered 2020-03-07: 2.5 mg via INTRAVENOUS
  Filled 2020-03-07: qty 5

## 2020-03-07 MED ORDER — AMIODARONE IV BOLUS ONLY 150 MG/100ML
INTRAVENOUS | Status: AC
  Administered 2020-03-07: 150 mg via INTRAVENOUS
  Filled 2020-03-07: qty 100

## 2020-03-07 MED ORDER — POLYETHYLENE GLYCOL 3350 17 G PO PACK: 17.0000 g | PACK | Freq: Every day | ORAL | Status: DC | PRN

## 2020-03-07 MED ORDER — ALBUTEROL SULFATE (2.5 MG/3ML) 0.083% IN NEBU: 5.0000 mg | INHALATION_SOLUTION | Freq: Once | RESPIRATORY_TRACT | Status: DC

## 2020-03-07 MED ORDER — METOPROLOL TARTRATE 5 MG/5ML IV SOLN: 5.0000 mg | Freq: Once | INTRAVENOUS | Status: AC

## 2020-03-07 MED ORDER — MAGNESIUM SULFATE 2 GM/50ML IV SOLN
INTRAVENOUS | Status: AC
  Administered 2020-03-07: 2 g via INTRAVENOUS
  Filled 2020-03-07: qty 50

## 2020-03-07 MED ORDER — HEPARIN BOLUS VIA INFUSION
4000.0000 [IU] | Freq: Once | INTRAVENOUS | Status: AC
  Administered 2020-03-07: 4000 [IU] via INTRAVENOUS
  Filled 2020-03-07: qty 4000

## 2020-03-07 MED ORDER — AMIODARONE HCL IN DEXTROSE 360-4.14 MG/200ML-% IV SOLN
30.0000 mg/h | INTRAVENOUS | Status: DC
  Administered 2020-03-07: 30 mg/h via INTRAVENOUS
  Administered 2020-03-07 – 2020-03-08 (×5): 60 mg/h via INTRAVENOUS
  Administered 2020-03-09: 30 mg/h via INTRAVENOUS
  Filled 2020-03-07 (×6): qty 200

## 2020-03-07 MED ORDER — ASPIRIN EC 325 MG PO TBEC
325.0000 mg | DELAYED_RELEASE_TABLET | Freq: Every day | ORAL | Status: DC
  Administered 2020-03-08: 325 mg via ORAL
  Filled 2020-03-07: qty 1

## 2020-03-07 MED ORDER — AMIODARONE IV BOLUS ONLY 150 MG/100ML
INTRAVENOUS | Status: AC
  Administered 2020-03-07: 150 mg
  Filled 2020-03-07: qty 100

## 2020-03-07 MED ORDER — INSULIN ASPART 100 UNIT/ML ~~LOC~~ SOLN: 2.0000 [IU] | SUBCUTANEOUS | Status: DC

## 2020-03-07 MED ORDER — AMIODARONE HCL IN DEXTROSE 360-4.14 MG/200ML-% IV SOLN
60.0000 mg/h | INTRAVENOUS | Status: AC
  Filled 2020-03-07: qty 200

## 2020-03-07 MED ORDER — CALCIUM GLUCONATE 10 % IV SOLN: 1.0000 g | Freq: Once | INTRAVENOUS | Status: AC

## 2020-03-07 MED ORDER — DOCUSATE SODIUM 100 MG PO CAPS: 100.0000 mg | ORAL_CAPSULE | Freq: Two times a day (BID) | ORAL | Status: DC | PRN

## 2020-03-07 MED ORDER — CALCIUM GLUCONATE 10 % IV SOLN
INTRAVENOUS | Status: AC
  Administered 2020-03-07: 1 g via INTRAVENOUS
  Filled 2020-03-07: qty 10

## 2020-03-07 MED ORDER — SODIUM CHLORIDE 0.9 % IV SOLN: INTRAVENOUS | Status: DC

## 2020-03-07 MED ORDER — SODIUM CHLORIDE 0.9 % IV BOLUS
500.0000 mL | Freq: Once | INTRAVENOUS | Status: AC
  Administered 2020-03-07: 500 mL via INTRAVENOUS

## 2020-03-07 MED ORDER — CHLORHEXIDINE GLUCONATE CLOTH 2 % EX PADS
6.0000 | MEDICATED_PAD | Freq: Every day | CUTANEOUS | Status: DC
  Administered 2020-03-09 – 2020-03-10 (×2): 6 via TOPICAL

## 2020-03-07 MED ORDER — HEPARIN (PORCINE) 25000 UT/250ML-% IV SOLN
1200.0000 [IU]/h | INTRAVENOUS | Status: DC
  Administered 2020-03-07 – 2020-03-08 (×2): 1300 [IU]/h via INTRAVENOUS
  Filled 2020-03-07 (×3): qty 250

## 2020-03-07 NOTE — H&P (Signed)
Name: Ralph Peterson MRN: [id number] DOB: 1965-09-23     CONSULTATION DATE: 03/07/2020  REFERRING MD :  [facility]  CHIEF COMPLAINT: AFib with RVR  HISTORY OF PRESENT ILLNESS:     54 y.o. male whose medical history includes but is not limited to end-stage renal disease on hemodialysis with dialysis on Mondays Wednesdays and Fridays, severe coronary artery disease, diabetes, and severe left systolic heart dysfunction with an EF of 25 to 30% (all of this information was verified in [facility]).     He presents for evaluation of shortness of breath and generally feeling unwell.   The symptoms started during dialysis about 24 hours ago when he became acutely short of breath and they stopped to dialysis early.  He said the symptoms have been waxing and waning for about the last 24 hours.    He has had no chest pain but he has felt very short of breath at times and feels like his heart is racing.  He has been dizzy and lightheaded.  He denies fever/chills.  He has had nausea and some dry heaving.  He denies abdominal pain.  Nothing in particular makes the symptoms better or worse and they became severe overnight so he came to the emergency department.  +COVID-19 about 7 months ago and was in the hospital and also was told he had a heart attack at the time.  Upon arrival in triage his heart rate was in the 170s and he was brought immediately back to a room. FSBS 600 Patient was given PRED taper and Doxy last week  ER COURSE AMIO HEPARIN INFUSION He is alert and awake, following commands NAD        PAST MEDICAL HISTORY :   has a past medical history of CAD (coronary artery disease), Coronary artery disease, COVID-19 virus infection (07/2019), Diabetes mellitus without complication ([place]), ESRD (end stage renal disease) ([place]), Hyperlipidemia, Hypertension, Ischemic cardiomyopathy, NSTEMI (non-ST elevated myocardial infarction) ([place]) (07/2019), PAD (peripheral artery disease) ([place]), and  Sleep apnea.  has a past surgical history that includes Cardiac catheterization (Right, 03/26/2016); Cardiac catheterization (2012); ABDOMINAL AORTOGRAM (N/A, 09/10/2017); Lower Extremity Angiography (Bilateral, 09/10/2017); PERIPHERAL VASCULAR ATHERECTOMY (Left, 09/10/2017); PERIPHERAL VASCULAR INTERVENTION (Left, 09/10/2017); PERIPHERAL VASCULAR BALLOON ANGIOPLASTY (Left, 09/10/2017); Amputation toe (Bilateral); Lower Extremity Angiography (Left, 06/23/2018); and Lower Extremity Angiography (Right, 02/01/2020). Prior to Admission medications   Medication Sig Start Date End Date Taking? Authorizing Provider  acetaminophen (TYLENOL) 500 MG tablet Take 1,000 mg by mouth 2 (two) times daily as needed for moderate pain.   Yes [provider]  albuterol (VENTOLIN HFA) 108 (90 Base) MCG/ACT inhaler Inhale 1-2 puffs into the lungs every 6 (six) hours as needed for wheezing or shortness of breath.  02/24/20  Yes [provider]  aspirin EC 81 MG tablet Take 81 mg by mouth every evening.   Yes [provider]  atorvastatin (LIPITOR) 40 MG tablet Take 40 mg by mouth every evening.  03/31/18  Yes [provider]  carvedilol (COREG) 25 MG tablet Take 25 mg by mouth 2 (two) times daily with a meal.  06/18/13  Yes [provider]  cinacalcet (SENSIPAR) 30 MG tablet Take 30 mg by mouth daily.   Yes [provider]  clopidogrel (PLAVIX) 75 MG tablet TAKE 1 TABLET BY MOUTH ONCE DAILY. Patient taking differently: Take 75 mg by mouth daily.  10/20/19  Yes Minna Merritts, MD  diphenhydrAMINE (BENADRYL) 25 MG tablet Take 25 mg by mouth at bedtime  as needed for allergies.   Yes [provider]  escitalopram (LEXAPRO) 20 MG tablet Take 20 mg by mouth daily.    Yes [provider]  fluticasone (FLONASE) 50 MCG/ACT nasal spray Place 2 sprays into both nostrils every evening.    Yes [provider]  glipiZIDE (GLUCOTROL) 5 MG tablet Take 5 mg by mouth  daily before breakfast.  01/01/19  Yes [provider]  montelukast (SINGULAIR) 10 MG tablet Take 10 mg by mouth daily. 02/17/20  Yes [provider]  predniSONE (DELTASONE) 10 MG tablet Day 1-2: Take 50mg (5 pills) Day 3-4: Take 40mg (4) Day 5-6: 30mg (3) Day 7-8: 20mg (2) Day 9:10mg (1 03/03/20  Yes Merlyn Lot, MD  sevelamer carbonate (RENVELA) 800 MG tablet Take 1,600-3,200 mg by mouth See admin instructions. Take 4 tablets (3200mg ) three times daily with meals and take 1 tablet (800mg ) by mouth with snacks   Yes [provider]   Allergies  Allergen Reactions  . Penicillins Rash    Has patient had a PCN reaction causing immediate rash, facial/tongue/throat swelling, SOB or lightheadedness with hypotension: Yes Has patient had a PCN reaction causing severe rash involving mucus membranes or skin necrosis: Yes Has patient had a PCN reaction that required hospitalization: No Has patient had a PCN reaction occurring within the last 10 years: No If all of the above answers are "NO", then may proceed with Cephalosporin use.     FAMILY HISTORY:  family history includes Hypertension in his mother. SOCIAL HISTORY:  reports that he quit smoking about 11 years ago. His smoking use included cigarettes. He has never used smokeless tobacco. He reports previous alcohol use. He reports that he does not use drugs.   Review of Systems:  Gen:  Denies  fever, sweats, chills weight loss  HEENT: Denies blurred vision, double vision, ear pain, eye pain, hearing loss, nose bleeds, sore throat Cardiac:  No dizziness, chest pain or heaviness, chest tightness,edema, No JVD Resp:   No cough, -sputum production, +shortness of breath,-wheezing, -hemoptysis,  Gi: Denies swallowing difficulty, stomach pain, nausea or vomiting, diarrhea, constipation, bowel incontinence Gu:  Denies bladder incontinence, burning urine Ext:   Denies Joint pain, stiffness or swelling Skin: Denies  skin rash,  easy bruising or bleeding or hives Endoc:  Denies polyuria, polydipsia , polyphagia or weight change Psych:   Denies depression, insomnia or hallucinations  Other:  All other systems negative   Pressure Injury 03/07/20 Coccyx Mid Stage 1 -  Intact skin with non-blanchable redness of a localized area usually over a bony prominence. (Active)  03/07/20 1229  Location: Coccyx  Location Orientation: Mid  Staging: Stage 1 -  Intact skin with non-blanchable redness of a localized area usually over a bony prominence.  Wound Description (Comments):   Present on Admission:      Estimated body mass index is 33.19 kg/m as calculated from the following:   Height as of this encounter: 6' (1.829 m).   Weight as of this encounter: 111 kg.    VITAL SIGNS: Temp:  [98.4 F (36.9 C)] 98.4 F (36.9 C) (08/17 1141) Pulse Rate:  [36-166] 157 (08/17 1300) Resp:  [8-46] 33 (08/17 1300) BP: (90-128)/(66-109) 128/89 (08/17 1300) SpO2:  [87 %-100 %] 99 % (08/17 1300) Weight:  [111 kg-111.1 kg] 111 kg (08/17 1141)   I/O last 3 completed shifts: In: 200 [I.V.:200] Out: -  Total I/O In: 698.6 [I.V.:148.6; IV Piggyback:550] Out: -    SpO2: 99 % O2 Flow Rate (L/min):  3 L/min   Physical Examination:  GENERAL:ill appearing, No resp distress HEAD: Normocephalic, atraumatic.  EYES: Pupils equal, round, reactive to light.  No scleral icterus.  MOUTH: Moist mucosal membrane. NECK: Supple. No JVD.  PULMONARY: +rhonchi, +wheezing CARDIOVASCULAR: S1 and S2. Regular rate and rhythm. No murmurs, rubs, or gallops.  GASTROINTESTINAL: Soft, nontender, -distended.  Positive bowel sounds.  MUSCULOSKELETAL: No swelling, clubbing, or edema.  NEUROLOGIC: alert and awake SKIN:intact,warm,dry    MEDICATIONS: I have reviewed all medications and confirmed regimen as documented   CULTURE RESULTS   Recent Results (from the past 240 hour(s))  SARS Coronavirus 2 by RT PCR (hospital order, performed in [facility] hospital lab) Nasopharyngeal Nasopharyngeal Swab     Status: None   Collection Time: 03/03/20 11:02 AM   Specimen: Nasopharyngeal Swab  Result Value Ref Range Status   SARS Coronavirus 2 NEGATIVE NEGATIVE Final    Comment: (NOTE) SARS-CoV-2 target nucleic acids are NOT DETECTED.  The SARS-CoV-2 RNA is generally detectable in upper and lower respiratory specimens during the acute phase of infection. The lowest concentration of SARS-CoV-2 viral copies this assay can detect is 250 copies / mL. A negative result does not preclude SARS-CoV-2 infection and should not be used as the sole basis for treatment or other patient management decisions.  A negative result may occur with improper specimen collection / handling, submission of specimen other than nasopharyngeal swab, presence of viral mutation(s) within the areas targeted by this assay, and inadequate number of viral copies (<250 copies / mL). A negative result must be combined with clinical observations, patient history, and epidemiological information.  Fact Sheet for Patients:   StrictlyIdeas.no  Fact Sheet for Healthcare Providers: BankingDealers.co.za  This test is not yet approved or  cleared by the [place] FDA and has been authorized for detection and/or diagnosis of SARS-CoV-2 by FDA under an Emergency Use Authorization (EUA).  This EUA will remain in effect (meaning this test can be used) for the duration of the COVID-19 declaration under Section 564(b)(1) of the Act, 21 U.S.C. section 360bbb-3(b)(1), unless the authorization is terminated or revoked sooner.  Performed at [facility], [address] [place], [place] [address]   SARS Coronavirus 2 by RT PCR (hospital order, performed in [facility] hospital lab) Nasopharyngeal Nasopharyngeal Swab     Status: None   Collection Time: 03/07/20  5:36 AM   Specimen: Nasopharyngeal Swab  Result Value Ref  Range Status   SARS Coronavirus 2 NEGATIVE NEGATIVE Final    Comment: (NOTE) SARS-CoV-2 target nucleic acids are NOT DETECTED.  The SARS-CoV-2 RNA is generally detectable in upper and lower respiratory specimens during the acute phase of infection. The lowest concentration of SARS-CoV-2 viral copies this assay can detect is 250 copies / mL. A negative result does not preclude SARS-CoV-2 infection and should not be used as the sole basis for treatment or other patient management decisions.  A negative result may occur with improper specimen collection / handling, submission of specimen other than nasopharyngeal swab, presence of viral mutation(s) within the areas targeted by this assay, and inadequate number of viral copies (<250 copies / mL). A negative result must be combined with clinical observations, patient history, and epidemiological information.  Fact Sheet for Patients:   StrictlyIdeas.no  Fact Sheet for Healthcare Providers: BankingDealers.co.za  This test is not yet approved or  cleared by the [place] FDA and has been authorized for detection and/or diagnosis of SARS-CoV-2 by FDA under an Emergency  Use Authorization (EUA).  This EUA will remain in effect (meaning this test can be used) for the duration of the COVID-19 declaration under Section 564(b)(1) of the Act, 21 U.S.C. section 360bbb-3(b)(1), unless the authorization is terminated or revoked sooner.  Performed at [facility], [address] [place], [place] [address]           IMAGING    DG Chest Portable 1 View  Result Date: 03/07/2020 CLINICAL DATA:  Acute shortness of breath.  Arrhythmia. EXAM: PORTABLE CHEST 1 VIEW COMPARISON:  03/03/2020.  07/26/2019. FINDINGS: Mediastinum and hilar structures normal. Cardiomegaly again noted. Normal pulmonary vascularity. Bilateral interstitial prominence again noted. These changes may be chronic. An  active interstitial process including pneumonitis and or interstitial edema cannot be excluded. Low lung volumes. No pleural effusion or pneumothorax. IMPRESSION: 1.  Cardiomegaly again noted.  No pulmonary venous congestion. 2. Bilateral interstitial prominence again noted. These changes may be chronic. Active interstitial process including pneumonitis and or interstitial edema cannot be excluded. 3.  Low lung volumes. Electronically Signed   By: Marcello Moores  Register   On: 03/07/2020 06:05     Nutrition Status:        BMP Latest Ref Rng & Units 03/07/2020 03/07/2020 03/03/2020  Glucose 70 - 99 mg/dL 207(H) 792(HH) 377(H)  BUN 6 - 20 mg/dL 45(H) 41(H) 62(H)  Creatinine 0.61 - 1.24 mg/dL 5.43(H) 5.26(H) 7.65(H)  BUN/Creat Ratio 9 - 20 - - -  Sodium 135 - 145 mmol/L 133(L) 125(L) 130(L)  Potassium 3.5 - 5.1 mmol/L 3.5 4.5 4.3  Chloride 98 - 111 mmol/L 91(L) 83(L) 87(L)  CO2 22 - 32 mmol/L 25 26 25   Calcium 8.9 - 10.3 mg/dL 8.5(L) 8.2(L) 8.3(L)         ASSESSMENT AND PLAN SYNOPSIS  ACUTE SYSTOLIC CARDIAC FAILURE- EF 25% AFIB WITH RVR  -oxygen as needed -Lasix as tolerated -follow up cardiac enzymes as indicated -follow up cardiology recs - on beta blocker On amiodarone On heparin infusions  Severe HYPERGLYCEMIA Start insulin drip  obesity, possible OSA.   Will certainly impact respiratory mechanics,  END STAGE KIDNEY INJURY/Renal Failure HD as needed    CARDIAC ICU monitoring  GI GI PROPHYLAXIS as indicated  NUTRITIONAL STATUS DIET--> as tolerated Constipation protocol as indicated   ENDO - will use ICU hypoglycemic\Hyperglycemia protocol if needed    ELECTROLYTES -follow labs as needed -replace as needed -pharmacy consultation and following    DVT/GI PRX ordered and assessed TRANSFUSIONS AS NEEDED MONITOR FSBS I Assessed the need for Labs I Assessed the need for Foley I Assessed the need for Central Venous Line Family Discussion when available I  Assessed the need for Mobilization I made an Assessment of medications to be adjusted accordingly Safety Risk assessment Completed  CASE DISCUSSED IN MULTIDISCIPLINARY ROUNDS WITH ICU TEAM   Critical Care Time devoted to patient care services described in this note is 43 minutes.   Overall, patient is critically ill, prognosis is guarded.     Corrin Parker, M.D.  [facility]  Medical Director [place] Director [facility] Cardio-Pulmonary Department

## 2020-03-07 NOTE — Consult Note (Addendum)
[place] for Electrolyte Monitoring and Replacement   Recent Labs: Potassium (mmol/L)  Date Value  03/07/2020 4.5   Magnesium (mg/dL)  Date Value  03/07/2020 2.4   Calcium (mg/dL)  Date Value  03/07/2020 8.2 (L)   Albumin (g/dL)  Date Value  03/07/2020 3.2 (L)  03/19/2016 4.1   Phosphorus (mg/dL)  Date Value  07/28/2019 2.7   Sodium (mmol/L)  Date Value  03/07/2020 125 (L)  03/19/2016 139     Assessment: Patient is a 54 y/o M with medical history including diabetes, ESRD on HD, CHF who presented to the ED 8/17 with shortness of breath and dizziness. Subsequently found to be in Afib with RVR and started on amiodarone. Labs on admission notable for glucose of 792, BNP 3819, troponin 978. Patient was started on an insulin infusion. Pharmacy has been consulted to assist with electrolyte replacement.   Goal of Therapy:  K 4, Mg 2, all other electrolytes within normal limits  Plan:  --No electrolyte replacement indicated at this time --Will order repeat BMP for 1200 given patient is on insulin infusion  Benita Gutter 03/07/2020 7:25 AM

## 2020-03-07 NOTE — Consult Note (Signed)
[name] for Electrolyte Monitoring and Replacement   Recent Labs: Potassium (mmol/L)  Date Value  03/07/2020 4.3   Magnesium (mg/dL)  Date Value  03/07/2020 2.4   Calcium (mg/dL)  Date Value  03/07/2020 8.5 (L)   Albumin (g/dL)  Date Value  03/07/2020 3.2 (L)  03/19/2016 4.1   Phosphorus (mg/dL)  Date Value  03/07/2020 3.3   Sodium (mmol/L)  Date Value  03/07/2020 133 (L)  03/19/2016 139     Assessment: Patient is a 54 y/o M with medical history including diabetes, ESRD on HD, CHF who presented to the ED 8/17 with shortness of breath and dizziness. Subsequently found to be in Afib with RVR and started on amiodarone. Labs on admission notable for glucose of 792, BNP 3819, troponin 978. Patient was started on an insulin infusion. Pharmacy has been consulted to assist with electrolyte replacement.   Goal of Therapy:  K 4, Mg 2, all other electrolytes within normal limits  Plan:  --K 4.3  --Will follow-up BMP with am labs.   Ralph Peterson, PharmD, BCPS Clinical Pharmacist 03/07/2020 8:30 PM

## 2020-03-07 NOTE — Progress Notes (Signed)
Hemodialysis patient known at [facility] MWF 6:30am. Patient normally drives self or family will transport to treatments. Please contact me with any dialysis placement concerns.  Elvera Bicker Dialysis Coordinator [phone]

## 2020-03-07 NOTE — Consult Note (Addendum)
[place] for Electrolyte Monitoring and Replacement   Recent Labs: Potassium (mmol/L)  Date Value  03/07/2020 3.5   Magnesium (mg/dL)  Date Value  03/07/2020 2.4   Calcium (mg/dL)  Date Value  03/07/2020 8.5 (L)   Albumin (g/dL)  Date Value  03/07/2020 3.2 (L)  03/19/2016 4.1   Phosphorus (mg/dL)  Date Value  03/07/2020 3.3   Sodium (mmol/L)  Date Value  03/07/2020 133 (L)  03/19/2016 139     Assessment: Patient is a 54 y/o M with medical history including diabetes, ESRD on HD, CHF who presented to the ED 8/17 with shortness of breath and dizziness. Subsequently found to be in Afib with RVR and started on amiodarone. Labs on admission notable for glucose of 792, BNP 3819, troponin 978. Patient was started on an insulin infusion. Pharmacy has been consulted to assist with electrolyte replacement.   Goal of Therapy:  K 4, Mg 2, all other electrolytes within normal limits  Plan:  --K 3.5 from 4.5 this AM with initiation of insulin infusion and fluid resuscitation. BG improving, weaning insulin infusion. Diet is now ordered. Will order potassium 20 mEq PO x 1 --Will follow-up BMP at 1700 with HL. Anticipate upcoming transition to SQ insulin.  Benita Gutter 03/07/2020 1:56 PM

## 2020-03-07 NOTE — Consult Note (Signed)
CARDIOLOGY CONSULT NOTE               Patient ID: Ralph Peterson MRN: [id number] DOB/AGE: 52/52/1967 52 y.o.  Admit date: 03/07/2020 Referring Physician Dr. Karma Greaser emergency room Primary Physician Posey Pronto Primary Cardiologist Dr. Nehemiah Massed Reason for Consultation atrial fibrillation wide-complex RVR  HPI: Patient has a history of multiple medical problems shortness of breath dyspnea end-stage renal disease on dialysis diabetes acute on chronic systolic dysfunction multivessel coronary artery disease history of PCI history of Covid infection hyperlipidemia hypertension ischemic cardiomyopathy peripheral vascular disease previous non-STEMI probable obstructive sleep apnea who presented with significant dyspnea shortness of breath over the last 24-48 hrs. emergency room he was found to have atrial fibrillation rapid ventricular response patient was advised to be admitted for further evaluation of some concern whether this might been ventricular tachycardia but appears that it was atrial fibrillation RVR with aberrancy patient denies any chest pain at all and just had significant shortness of breath no leg edema no fever chills or sweats  Review of systems complete and found to be negative unless listed above     Past Medical History:  Diagnosis Date  . CAD (coronary artery disease)    a. 09/2010 Cath/PCI ([facility]): LM nl, LAD 58m, D1 80 (small), LCX 84m, OM1 30, RI 70 (small), RCA 70 (DES).  . Coronary artery disease   . COVID-19 virus infection 07/2019  . Diabetes mellitus without complication ([place])   . ESRD (end stage renal disease) ([place])   . Hyperlipidemia   . Hypertension   . Ischemic cardiomyopathy    a.  12/2011 Echo ([facility]): NL EF, mod LVH. Mild AS/MS, triv PR/TR. 07/2019 Echo: EF 25-30%, GR1 DD, inf/post HK, low nl RV fxn, mildly dil RA, triv MR, mild Ao sclerosis w/o stenosis.  . NSTEMI (non-ST elevated myocardial infarction) ([place]) 07/2019  . PAD (peripheral artery disease)  ([place])    a. 03/2016 s/p PTA/DEB R SFA/popliteal/peroneal; b. 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 08/2017 Atherectomy/DEB dist L SFA/popliteal. PTA of L AT; d. 06/2018 PTA/DEB L SFA/popliteal/PT/AT; e. 01/2019 Stable ABIs.  . Sleep apnea     Past Surgical History:  Procedure Laterality Date  . ABDOMINAL AORTOGRAM N/A 09/10/2017   Procedure: ABDOMINAL AORTOGRAM;  Surgeon: Wellington Hampshire, MD;  Location: [place] [facility];  Service: Cardiovascular;  Laterality: N/A;  . AMPUTATION TOE Bilateral    one toe on right, all five toes on the left  . CARDIAC CATHETERIZATION  2012   [facility]; X1 stent 2.5 x 33 mm xience stent Distal TCA  . LOWER EXTREMITY ANGIOGRAPHY Bilateral 09/10/2017   Procedure: Lower Extremity Angiography;  Surgeon: Wellington Hampshire, MD;  Location: [place] [facility];  Service: Cardiovascular;  Laterality: Bilateral;  . LOWER EXTREMITY ANGIOGRAPHY Left 06/23/2018   Procedure: LOWER EXTREMITY ANGIOGRAPHY;  Surgeon: Katha Cabal, MD;  Location: [place] [facility];  Service: Cardiovascular;  Laterality: Left;  . LOWER EXTREMITY ANGIOGRAPHY Right 02/01/2020   Procedure: LOWER EXTREMITY ANGIOGRAPHY;  Surgeon: Katha Cabal, MD;  Location: [place] [facility];  Service: Cardiovascular;  Laterality: Right;  . PERIPHERAL VASCULAR ATHERECTOMY Left 09/10/2017   Procedure: PERIPHERAL VASCULAR ATHERECTOMY;  Surgeon: Wellington Hampshire, MD;  Location: [place] [facility];  Service: Cardiovascular;  Laterality: Left;  SFA/POPLITEAL  . PERIPHERAL VASCULAR BALLOON ANGIOPLASTY Left 09/10/2017   Procedure: PERIPHERAL VASCULAR BALLOON ANGIOPLASTY;  Surgeon: Wellington Hampshire, MD;  Location: [place] [facility];  Service: Cardiovascular;  Laterality: Left;  ANTERIAL TIBIAL  . PERIPHERAL VASCULAR CATHETERIZATION Right 03/26/2016   Procedure: Lower Extremity Angiography;  Surgeon: Katha Cabal, MD;  Location: [place] [facility];  Service: Cardiovascular;  Laterality: Right;  .  PERIPHERAL VASCULAR INTERVENTION Left 09/10/2017   Procedure: PERIPHERAL VASCULAR INTERVENTION;  Surgeon: Wellington Hampshire, MD;  Location: [place] [facility];  Service: Cardiovascular;  Laterality: Left;  SFA/POPLITEAL    Medications Prior to Admission  Medication Sig Dispense Refill Last Dose  . acetaminophen (TYLENOL) 500 MG tablet Take 1,000 mg by mouth 2 (two) times daily as needed for moderate pain.   Unknown at PRN  . albuterol (VENTOLIN HFA) 108 (90 Base) MCG/ACT inhaler Inhale 1-2 puffs into the lungs every 6 (six) hours as needed for wheezing or shortness of breath.    Unknown at PRN  . aspirin EC 81 MG tablet Take 81 mg by mouth every evening.     Marland Kitchen atorvastatin (LIPITOR) 40 MG tablet Take 40 mg by mouth every evening.   1 18-24 hours at Unknown  . carvedilol (COREG) 25 MG tablet Take 25 mg by mouth 2 (two) times daily with a meal.    18-24 hours at Unknown  . cinacalcet (SENSIPAR) 30 MG tablet Take 30 mg by mouth daily.   18-24 hours at Unknown  . clopidogrel (PLAVIX) 75 MG tablet TAKE 1 TABLET BY MOUTH ONCE DAILY. (Patient taking differently: Take 75 mg by mouth daily. ) 15 tablet 0 18-24 hours at Unknown  . diphenhydrAMINE (BENADRYL) 25 MG tablet Take 25 mg by mouth at bedtime as needed for allergies.   Unknown at PRN  . escitalopram (LEXAPRO) 20 MG tablet Take 20 mg by mouth daily.    18-24 hours at Unknown  . fluticasone (FLONASE) 50 MCG/ACT nasal spray Place 2 sprays into both nostrils every evening.      Marland Kitchen glipiZIDE (GLUCOTROL) 5 MG tablet Take 5 mg by mouth daily before breakfast.    18-24 hours at Unknown  . montelukast (SINGULAIR) 10 MG tablet Take 10 mg by mouth daily.   18-24 hours at Unknown  . predniSONE (DELTASONE) 10 MG tablet Day 1-2: Take 50mg (5 pills) Day 3-4: Take 40mg (4) Day 5-6: 30mg (3) Day 7-8: 20mg (2) Day 9:10mg (1 29 tablet 0 Past Week at Unknown time  . sevelamer carbonate (RENVELA) 800 MG tablet Take 1,600-3,200 mg by mouth See admin instructions. Take 4 tablets  (3200mg ) three times daily with meals and take 1 tablet (800mg ) by mouth with snacks   18-24 hours at Unknown   Social History   Socioeconomic History  . Marital status: Single    Spouse name: Not on file  . Number of children: Not on file  . Years of education: Not on file  . Highest education level: Not on file  Occupational History  . Not on file  Tobacco Use  . Smoking status: Former Smoker    Types: Cigarettes    Quit date: 2010    Years since quitting: 11.6  . Smokeless tobacco: Never Used  Vaping Use  . Vaping Use: Never used  Substance and Sexual Activity  . Alcohol use: Not Currently  . Drug use: No  . Sexual activity: Not on file  Other Topics Concern  . Not on file  Social History Narrative  . Not on file   Social Determinants of Health   Financial Resource Strain:   . Difficulty of Paying Living Expenses:   Food Insecurity:   . Worried About Running  Out of Food in the Last Year:   . [place] in the Last Year:   Transportation Needs:   . Lack of Transportation (Medical):   Marland Kitchen Lack of Transportation (Non-Medical):   Physical Activity:   . Days of Exercise per Week:   . Minutes of Exercise per Session:   Stress:   . Feeling of Stress :   Social Connections:   . Frequency of Communication with Friends and Family:   . Frequency of Social Gatherings with Friends and Family:   . Attends Religious Services:   . Active Member of Clubs or Organizations:   . Attends Archivist Meetings:   Marland Kitchen Marital Status:   Intimate Partner Violence:   . Fear of Current or Ex-Partner:   . Emotionally Abused:   Marland Kitchen Physically Abused:   . Sexually Abused:     Family History  Problem Relation Age of Onset  . Hypertension Mother       Review of systems complete and found to be negative unless listed above      PHYSICAL EXAM  General: Well developed, well nourished, in no acute distress HEENT:  Normocephalic and atramatic Neck:  No JVD.  Lungs: Clear  bilaterally to auscultation and percussion. Heart: HRRR . Normal S1 and S2 without gallops or murmurs.  Abdomen: Bowel sounds are positive, abdomen soft and non-tender  Msk:  Back normal, normal gait. Normal strength and tone for age. Extremities: No clubbing, cyanosis or edema.   Neuro: Alert and oriented X 3. Psych:  Good affect, responds appropriately  Labs:   Lab Results  Component Value Date   WBC 11.7 (H) 03/07/2020   HGB 11.3 (L) 03/07/2020   HCT 36.9 (L) 03/07/2020   MCV 95.3 03/07/2020   PLT 190 03/07/2020    Recent Labs  Lab 03/07/20 0536 03/07/20 0536 03/07/20 1217  NA 125*   < > 133*  K 4.5   < > 3.5  CL 83*   < > 91*  CO2 26   < > 25  BUN 41*   < > 45*  CREATININE 5.26*   < > 5.43*  CALCIUM 8.2*   < > 8.5*  PROT 6.1*  --   --   BILITOT 1.1  --   --   ALKPHOS 169*  --   --   ALT 37  --   --   AST 20  --   --   GLUCOSE 792*   < > 207*   < > = values in this interval not displayed.   No results found for: CKTOTAL, CKMB, CKMBINDEX, TROPONINI No results found for: CHOL No results found for: HDL No results found for: LDLCALC No results found for: TRIG No results found for: CHOLHDL No results found for: LDLDIRECT    Radiology: DG Chest 2 View  Result Date: 03/03/2020 CLINICAL DATA:  Patient complains of sob and cough. X 1 week. Patient states that he saw his PCP last week and was diagnosed with bronchitis. Patient states that he is not getting any better on meds. EXAM: CHEST - 2 VIEW COMPARISON:  Chest radiograph 07/26/2019 FINDINGS: Stable cardiomediastinal contours with mildly enlarged heart size. Low lung volumes. Mild right basilar opacities likely reflecting atelectasis. Diffuse bilateral mild interstitial thickening. No focal consolidation. No pneumothorax or pleural effusion. No acute finding in the visualized skeleton. IMPRESSION: Mild right basilar opacities likely atelectasis. Diffuse bilateral mild interstitial thickening could reflect bronchitic  changes or trace edema. Electronically  Signed   By: Audie Pinto M.D.   On: 03/03/2020 08:53   DG Chest Portable 1 View  Result Date: 03/07/2020 CLINICAL DATA:  Acute shortness of breath.  Arrhythmia. EXAM: PORTABLE CHEST 1 VIEW COMPARISON:  03/03/2020.  07/26/2019. FINDINGS: Mediastinum and hilar structures normal. Cardiomegaly again noted. Normal pulmonary vascularity. Bilateral interstitial prominence again noted. These changes may be chronic. An active interstitial process including pneumonitis and or interstitial edema cannot be excluded. Low lung volumes. No pleural effusion or pneumothorax. IMPRESSION: 1.  Cardiomegaly again noted.  No pulmonary venous congestion. 2. Bilateral interstitial prominence again noted. These changes may be chronic. Active interstitial process including pneumonitis and or interstitial edema cannot be excluded. 3.  Low lung volumes. Electronically Signed   By: Marcello Moores  Register   On: 03/07/2020 06:05    EKG: Atrial fibrillation wide-complex tachycardic RVR  ASSESSMENT AND PLAN:  Wide-complex tachycardia probably A. fib with aberrancy Hyperglycemia diabetes Known cardiomyopathy End-stage renal disease on dialysis Acute on chronic systolic heart failure Probable obstructive sleep apnea Obesity Electrolyte abnormalities . Plan Agree with ICU level care and support Continue anticoagulation with heparin intravenously Consider switching to long-term anticoagulation with Eliquis or Xarelto  Amiodarone intravenously for rhythm antiarrhythmic control We will maintain beta-blockade therapy to help with rate management Consider adding IV digoxin to help with rate control Continue dialysis for end-stage renal disease Agree with evaluation with sleep study for obstructive sleep apnea Do not recommend invasive strategy at this point        Signed: Yolonda Kida MD 03/07/2020, 5:12 PM

## 2020-03-07 NOTE — Progress Notes (Signed)
Pt arrived on the unit. A/Ox4, [place] at 3L. HR in 160s. MD notified, cardiology consulted and notified. Lopressor 5mg  q2hr and digoxin were given. HR reduced not much. But pt seems comfortable, not in distress, no SOB.  Insulin gtt is still going. Slowly normalizing. 4th IV was placed due to incompatibility of medications Bed in low position, alarms are on, call bell in reach, family at the bedside. Continue to monitor.

## 2020-03-07 NOTE — Progress Notes (Signed)
Patient admitted to ICU service earlier today, sent to ER from dialysis with A. fib with RVR.  Also found to be hyperglycemic requiring insulin infusion. Patient is now hemodynamically stable, on heparin and amnioinfusion. CBG improved and they are planning to transition him from insulin infusion to subcu insulin.  Try her to resume care from 03/08/20.

## 2020-03-07 NOTE — Progress Notes (Signed)
Inpatient Diabetes Program Recommendations  AACE/ADA: New Consensus Statement on Inpatient Glycemic Control (2015)  Target Ranges:  Prepandial:   less than 140 mg/dL      Peak postprandial:   less than 180 mg/dL (1-2 hours)      Critically ill patients:  140 - 180 mg/dL   Lab Results  Component Value Date   GLUCAP 125 (H) 03/07/2020   HGBA1C 8.5 (H) 07/27/2019    Review of Glycemic Control Results for REFORD, OLLIFF (MRN [id number]) as of 03/07/2020 15:34  Ref. Range 03/07/2020 11:29 03/07/2020 12:38 03/07/2020 13:40 03/07/2020 14:32  Glucose-Capillary Latest Ref Range: 70 - 99 mg/dL 256 (H) 236 (H) 156 (H) 125 (H)   Diabetes history: DM 2 Outpatient Diabetes medications:  Glucotrol 5 mg daily, Prednisone taper Current orders for Inpatient glycemic control:  IV insulin  Inpatient Diabetes Program Recommendations:    Patient was not previously on insulin however he was recently on the Prednisone taper which likely increased blood sugars.  Due to history of ESRD, consider only low dose of basal insulin.   -May consider Levemir 5 units bid for basal insulin at time of transition and Novolog very sensitive (0-6 units) q 4 hours.   Thanks  Adah Perl, RN, BC-ADM Inpatient Diabetes Coordinator Pager [phone] (8a-5p)

## 2020-03-07 NOTE — Progress Notes (Signed)
*  PRELIMINARY RESULTS* Echocardiogram 2D Echocardiogram has been performed.  Ralph Peterson 03/07/2020, 10:04 AM

## 2020-03-07 NOTE — Consult Note (Signed)
[address] [place], [place] [address] Phone [phone]. Fax [phone]  Date: 03/07/2020                  Patient Name:  Ralph Peterson  MRN: [id number]  DOB: June 10, 1966  Age / Sex: 54 y.o., male         PCP: Vidal Schwalbe, MD                 Service Requesting Consult: IM/ Flora Lipps, MD                 Reason for Consult: ESRD            History of Present Illness: Patient is a 54 y.o. male with medical problems of end-stage renal disease, on dialysis for 8 years, diabetes diagnosed at age 26, hypertension, coronary disease with stent, diabetic foot ulcer, who was admitted to [facility] on 03/07/2020 for evaluation of dizziness after hemodialysis was not resolved.   Patient reports that during the last 30 minutes of his hemodialysis yesterday, he felt dizzy.  He did not get better in the evening at home.  He felt fatigued.  He reports he was evaluated for shortness of breath and diagnosed with bronchitis last Friday.  He was prescribed prednisone.  Since then his blood sugars have been running high.  Today in the ER, his blood sugar was in the 700s. Therefore, he is being admitted for further evaluation He was also found to have ventricular arrhythmia and is currently on IV amiodarone and heparin drips.  He is also getting IV insulin drip for uncontrolled hyperglycemia. Nephrology consult has been requested for evaluation of hemodialysis.   Medications: Outpatient medications: Medications Prior to Admission  Medication Sig Dispense Refill Last Dose  . acetaminophen (TYLENOL) 500 MG tablet Take 1,000 mg by mouth 2 (two) times daily as needed for moderate pain.   Unknown at PRN  . albuterol (VENTOLIN HFA) 108 (90 Base) MCG/ACT inhaler Inhale 1-2 puffs into the lungs every 6 (six) hours as needed for wheezing or shortness of breath.    Unknown at PRN  . aspirin EC 81 MG tablet Take 81 mg by mouth every evening.     Marland Kitchen atorvastatin (LIPITOR) 40 MG tablet Take 40  mg by mouth every evening.   1 18-24 hours at Unknown  . carvedilol (COREG) 25 MG tablet Take 25 mg by mouth 2 (two) times daily with a meal.    18-24 hours at Unknown  . cinacalcet (SENSIPAR) 30 MG tablet Take 30 mg by mouth daily.   18-24 hours at Unknown  . clopidogrel (PLAVIX) 75 MG tablet TAKE 1 TABLET BY MOUTH ONCE DAILY. (Patient taking differently: Take 75 mg by mouth daily. ) 15 tablet 0 18-24 hours at Unknown  . diphenhydrAMINE (BENADRYL) 25 MG tablet Take 25 mg by mouth at bedtime as needed for allergies.   Unknown at PRN  . escitalopram (LEXAPRO) 20 MG tablet Take 20 mg by mouth daily.    18-24 hours at Unknown  . fluticasone (FLONASE) 50 MCG/ACT nasal spray Place 2 sprays into both nostrils every evening.      Marland Kitchen glipiZIDE (GLUCOTROL) 5 MG tablet Take 5 mg by mouth daily before breakfast.    18-24 hours at Unknown  . montelukast (SINGULAIR) 10 MG tablet Take 10 mg by mouth daily.   18-24 hours at Unknown  . predniSONE (DELTASONE) 10 MG tablet Day 1-2: Take 50mg (5 pills) Day 3-4: Take 40mg (4) Day 5-6:  30mg (3) Day 7-8: 20mg (2) Day 9:10mg (1 29 tablet 0 Past Week at Unknown time  . sevelamer carbonate (RENVELA) 800 MG tablet Take 1,600-3,200 mg by mouth See admin instructions. Take 4 tablets (3200mg ) three times daily with meals and take 1 tablet (800mg ) by mouth with snacks   18-24 hours at Unknown    Current medications: Current Facility-Administered Medications  Medication Dose Route Frequency Provider Last Rate Last Admin  . 0.9 %  sodium chloride infusion   Intravenous Continuous Flora Lipps, MD 125 mL/hr at 03/07/20 1325 New Bag at 03/07/20 1325  . albuterol (PROVENTIL) (2.5 MG/3ML) 0.083% nebulizer solution 5 mg  5 mg Nebulization Once Hinda Kehr, MD      . amiodarone (NEXTERONE PREMIX) 360-4.14 MG/200ML-% (1.8 mg/mL) IV infusion  30 mg/hr Intravenous Continuous Hinda Kehr, MD 16.67 mL/hr at 03/07/20 1300 30 mg/hr at 03/07/20 1300  . [START ON 03/08/2020] aspirin EC tablet 325  mg  325 mg Oral Daily Darel Hong D, NP      . dextrose 5 %-0.9 % sodium chloride infusion   Intravenous Continuous Flora Lipps, MD   Held at 03/07/20 1312  . dextrose 50 % solution 0-50 mL  0-50 mL Intravenous PRN Hinda Kehr, MD      . docusate sodium (COLACE) capsule 100 mg  100 mg Oral BID PRN Darel Hong D, NP      . heparin ADULT infusion 100 units/mL (25000 units/212mL sodium chloride 0.45%)  1,300 Units/hr Intravenous Continuous Rauer, Samantha O, RPH 13 mL/hr at 03/07/20 1300 1,300 Units/hr at 03/07/20 1300  . insulin regular, human (MYXREDLIN) 100 units/ 100 mL infusion   Intravenous Continuous Hinda Kehr, MD 1.9 mL/hr at 03/07/20 1341 1.9 Units/hr at 03/07/20 1341  . MEDLINE mouth rinse  15 mL Mouth Rinse BID Flora Lipps, MD   15 mL at 03/07/20 1337  . metoprolol tartrate (LOPRESSOR) injection 5 mg  5 mg Intravenous Q2H PRN Callwood, Dwayne D, MD      . polyethylene glycol (MIRALAX / GLYCOLAX) packet 17 g  17 g Oral Daily PRN Darel Hong D, NP      . potassium chloride SA (KLOR-CON) CR tablet 20 mEq  20 mEq Oral Once Benita Gutter, [facility]          Allergies: Allergies  Allergen Reactions  . Penicillins Rash    Has patient had a PCN reaction causing immediate rash, facial/tongue/throat swelling, SOB or lightheadedness with hypotension: Yes Has patient had a PCN reaction causing severe rash involving mucus membranes or skin necrosis: Yes Has patient had a PCN reaction that required hospitalization: No Has patient had a PCN reaction occurring within the last 10 years: No If all of the above answers are "NO", then may proceed with Cephalosporin use.       Past Medical History: Past Medical History:  Diagnosis Date  . CAD (coronary artery disease)    a. 09/2010 Cath/PCI ([facility]): LM nl, LAD 62m, D1 80 (small), LCX 53m, OM1 30, RI 70 (small), RCA 70 (DES).  . Coronary artery disease   . COVID-19 virus infection 07/2019  . Diabetes mellitus without complication  ([place])   . ESRD (end stage renal disease) ([place])   . Hyperlipidemia   . Hypertension   . Ischemic cardiomyopathy    a.  12/2011 Echo ([facility]): NL EF, mod LVH. Mild AS/MS, triv PR/TR. 07/2019 Echo: EF 25-30%, GR1 DD, inf/post HK, low nl RV fxn, mildly dil RA, triv MR, mild Ao sclerosis w/o stenosis.  Marland Kitchen  NSTEMI (non-ST elevated myocardial infarction) ([place]) 07/2019  . PAD (peripheral artery disease) ([place])    a. 03/2016 s/p PTA/DEB R SFA/popliteal/peroneal; b. 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 08/2017 Atherectomy/DEB dist L SFA/popliteal. PTA of L AT; d. 06/2018 PTA/DEB L SFA/popliteal/PT/AT; e. 01/2019 Stable ABIs.  . Sleep apnea      Past Surgical History: Past Surgical History:  Procedure Laterality Date  . ABDOMINAL AORTOGRAM N/A 09/10/2017   Procedure: ABDOMINAL AORTOGRAM;  Surgeon: Wellington Hampshire, MD;  Location: [place] [facility];  Service: Cardiovascular;  Laterality: N/A;  . AMPUTATION TOE Bilateral    one toe on right, all five toes on the left  . CARDIAC CATHETERIZATION  2012   [facility]; X1 stent 2.5 x 33 mm xience stent Distal TCA  . LOWER EXTREMITY ANGIOGRAPHY Bilateral 09/10/2017   Procedure: Lower Extremity Angiography;  Surgeon: Wellington Hampshire, MD;  Location: [place] [facility];  Service: Cardiovascular;  Laterality: Bilateral;  . LOWER EXTREMITY ANGIOGRAPHY Left 06/23/2018   Procedure: LOWER EXTREMITY ANGIOGRAPHY;  Surgeon: Katha Cabal, MD;  Location: [place] [facility];  Service: Cardiovascular;  Laterality: Left;  . LOWER EXTREMITY ANGIOGRAPHY Right 02/01/2020   Procedure: LOWER EXTREMITY ANGIOGRAPHY;  Surgeon: Katha Cabal, MD;  Location: [place] [facility];  Service: Cardiovascular;  Laterality: Right;  . PERIPHERAL VASCULAR ATHERECTOMY Left 09/10/2017   Procedure: PERIPHERAL VASCULAR ATHERECTOMY;  Surgeon: Wellington Hampshire, MD;  Location: [place] [facility];  Service: Cardiovascular;  Laterality: Left;  SFA/POPLITEAL  . PERIPHERAL VASCULAR  BALLOON ANGIOPLASTY Left 09/10/2017   Procedure: PERIPHERAL VASCULAR BALLOON ANGIOPLASTY;  Surgeon: Wellington Hampshire, MD;  Location: [place] [facility];  Service: Cardiovascular;  Laterality: Left;  ANTERIAL TIBIAL  . PERIPHERAL VASCULAR CATHETERIZATION Right 03/26/2016   Procedure: Lower Extremity Angiography;  Surgeon: Katha Cabal, MD;  Location: [place] [facility];  Service: Cardiovascular;  Laterality: Right;  . PERIPHERAL VASCULAR INTERVENTION Left 09/10/2017   Procedure: PERIPHERAL VASCULAR INTERVENTION;  Surgeon: Wellington Hampshire, MD;  Location: [place] [facility];  Service: Cardiovascular;  Laterality: Left;  SFA/POPLITEAL     Family History: Family History  Problem Relation Age of Onset  . Hypertension Mother      Social History: Social History   Socioeconomic History  . Marital status: Single    Spouse name: Not on file  . Number of children: Not on file  . Years of education: Not on file  . Highest education level: Not on file  Occupational History  . Not on file  Tobacco Use  . Smoking status: Former Smoker    Types: Cigarettes    Quit date: 2010    Years since quitting: 11.6  . Smokeless tobacco: Never Used  Vaping Use  . Vaping Use: Never used  Substance and Sexual Activity  . Alcohol use: Not Currently  . Drug use: No  . Sexual activity: Not on file  Other Topics Concern  . Not on file  Social History Narrative  . Not on file   Social Determinants of Health   Financial Resource Strain:   . Difficulty of Paying Living Expenses:   Food Insecurity:   . Worried About Charity fundraiser in the Last Year:   . Arboriculturist in the Last Year:   Transportation Needs:   . Film/video editor (Medical):   Marland Kitchen Lack of Transportation (Non-Medical):   Physical Activity:   . Days of Exercise per Week:   .  Minutes of Exercise per Session:   Stress:   . Feeling of Stress :   Social Connections:   . Frequency of Communication with Friends and Family:    . Frequency of Social Gatherings with Friends and Family:   . Attends Religious Services:   . Active Member of Clubs or Organizations:   . Attends Archivist Meetings:   Marland Kitchen Marital Status:   Intimate Partner Violence:   . Fear of Current or Ex-Partner:   . Emotionally Abused:   Marland Kitchen Physically Abused:   . Sexually Abused:     Gen: Denies any fevers or chills HEENT: No vision or hearing problems CV: No chest pain, does report shortness of breath, low BP and dizziness Resp: No cough or sputum production GI: No nausea, vomiting or diarrhea.  No blood in the stool GU : No problems with voiding.    MS: Ambulatory.  Denies any acute joint pain or swelling Derm:   No complaints Psych: No complaints Heme: No complaints Neuro: No complaints Endocrine: high blood sugars lately after starting Prednisone   Vital Signs: Blood pressure (!) 123/92, pulse 74, temperature 98.4 F (36.9 C), temperature source Oral, resp. rate (!) 32, height 6' (1.829 m), weight 111 kg, SpO2 100 %.   Intake/Output Summary (Last 24 hours) at 03/07/2020 1409 Last data filed at 03/07/2020 1300 Gross per 24 hour  Intake 898.6 ml  Output --  Net 898.6 ml    Weight trends: [employer]   03/07/20 0527 03/07/20 1141  Weight: 111.1 kg 111 kg   Physical Exam: General:  No acute distress, laying in the bed  HEENT  anicteric, moist oral mucous membrane  Pulm/lungs  normal breathing effort, lungs are clear to auscultation  CVS/Heart  irregular ventricular rhythm, no rub or gallop  Abdomen:   Soft, nontender  Extremities:  + peripheral edema, rt foot in boot  Neurologic:  Alert, oriented, able to follow commands  Skin:  No acute rashes     Lab results: Basic Metabolic Panel: Recent Labs  Lab 03/03/20 0804 03/07/20 0536 03/07/20 0805 03/07/20 1217  NA 130* 125*  --  133*  K 4.3 4.5  --  3.5  CL 87* 83*  --  91*  CO2 25 26  --  25  GLUCOSE 377* 792*  --  207*  BUN 62* 41*  --  45*   CREATININE 7.65* 5.26*  --  5.43*  CALCIUM 8.3* 8.2*  --  8.5*  MG  --  2.4  --   --   PHOS  --   --  3.3  --     Liver Function Tests: Recent Labs  Lab 03/07/20 0536  AST 20  ALT 37  ALKPHOS 169*  BILITOT 1.1  PROT 6.1*  ALBUMIN 3.2*   No results for input(s): LIPASE, AMYLASE in the last 168 hours. No results for input(s): AMMONIA in the last 168 hours.  CBC: Recent Labs  Lab 03/03/20 0804 03/07/20 0536  WBC 9.7 11.7*  NEUTROABS 7.0 8.9*  HGB 10.9* 11.3*  HCT 33.0* 36.9*  MCV 89.9 95.3  PLT 152 190    Cardiac Enzymes: No results for input(s): CKTOTAL, TROPONINI in the last 168 hours.  BNP: Invalid input(s): POCBNP  CBG: Recent Labs  Lab 03/07/20 0836 03/07/20 0913 03/07/20 0958 03/07/20 1238 03/07/20 1340  GLUCAP 521* 509* 415* 236* 156*    Microbiology: Recent Results (from the past 720 hour(s))  SARS Coronavirus 2 by RT PCR (hospital  order, performed in [facility] hospital lab) Nasopharyngeal Nasopharyngeal Swab     Status: None   Collection Time: 03/03/20 11:02 AM   Specimen: Nasopharyngeal Swab  Result Value Ref Range Status   SARS Coronavirus 2 NEGATIVE NEGATIVE Final    Comment: (NOTE) SARS-CoV-2 target nucleic acids are NOT DETECTED.  The SARS-CoV-2 RNA is generally detectable in upper and lower respiratory specimens during the acute phase of infection. The lowest concentration of SARS-CoV-2 viral copies this assay can detect is 250 copies / mL. A negative result does not preclude SARS-CoV-2 infection and should not be used as the sole basis for treatment or other patient management decisions.  A negative result may occur with improper specimen collection / handling, submission of specimen other than nasopharyngeal swab, presence of viral mutation(s) within the areas targeted by this assay, and inadequate number of viral copies (<250 copies / mL). A negative result must be combined with clinical observations, patient history, and  epidemiological information.  Fact Sheet for Patients:   StrictlyIdeas.no  Fact Sheet for Healthcare Providers: BankingDealers.co.za  This test is not yet approved or  cleared by the [place] FDA and has been authorized for detection and/or diagnosis of SARS-CoV-2 by FDA under an Emergency Use Authorization (EUA).  This EUA will remain in effect (meaning this test can be used) for the duration of the COVID-19 declaration under Section 564(b)(1) of the Act, 21 U.S.C. section 360bbb-3(b)(1), unless the authorization is terminated or revoked sooner.  Performed at [facility], [address] [place], [place] [address]   SARS Coronavirus 2 by RT PCR (hospital order, performed in [facility] hospital lab) Nasopharyngeal Nasopharyngeal Swab     Status: None   Collection Time: 03/07/20  5:36 AM   Specimen: Nasopharyngeal Swab  Result Value Ref Range Status   SARS Coronavirus 2 NEGATIVE NEGATIVE Final    Comment: (NOTE) SARS-CoV-2 target nucleic acids are NOT DETECTED.  The SARS-CoV-2 RNA is generally detectable in upper and lower respiratory specimens during the acute phase of infection. The lowest concentration of SARS-CoV-2 viral copies this assay can detect is 250 copies / mL. A negative result does not preclude SARS-CoV-2 infection and should not be used as the sole basis for treatment or other patient management decisions.  A negative result may occur with improper specimen collection / handling, submission of specimen other than nasopharyngeal swab, presence of viral mutation(s) within the areas targeted by this assay, and inadequate number of viral copies (<250 copies / mL). A negative result must be combined with clinical observations, patient history, and epidemiological information.  Fact Sheet for Patients:   StrictlyIdeas.no  Fact Sheet for Healthcare  Providers: BankingDealers.co.za  This test is not yet approved or  cleared by the [place] FDA and has been authorized for detection and/or diagnosis of SARS-CoV-2 by FDA under an Emergency Use Authorization (EUA).  This EUA will remain in effect (meaning this test can be used) for the duration of the COVID-19 declaration under Section 564(b)(1) of the Act, 21 U.S.C. section 360bbb-3(b)(1), unless the authorization is terminated or revoked sooner.  Performed at [facility], [address] [place], [place] [address]   MRSA PCR Screening     Status: None   Collection Time: 03/07/20 12:17 PM   Specimen: Nasal Mucosa; Nasopharyngeal  Result Value Ref Range Status   MRSA by PCR NEGATIVE NEGATIVE Final    Comment:        The GeneXpert MRSA Assay (FDA approved for  NASAL specimens only), is one component of a comprehensive MRSA colonization surveillance program. It is not intended to diagnose MRSA infection nor to guide or monitor treatment for MRSA infections. Performed at [facility], [address] [place], [place] [address]      Coagulation Studies: Recent Labs    03/07/20 0536  LABPROT 17.6*  INR 1.5*    Urinalysis: No results for input(s): COLORURINE, LABSPEC, PHURINE, GLUCOSEU, HGBUR, BILIRUBINUR, KETONESUR, PROTEINUR, UROBILINOGEN, NITRITE, LEUKOCYTESUR in the last 72 hours.  Invalid input(s): APPERANCEUR      Imaging: DG Chest Portable 1 View  Result Date: 03/07/2020 CLINICAL DATA:  Acute shortness of breath.  Arrhythmia. EXAM: PORTABLE CHEST 1 VIEW COMPARISON:  03/03/2020.  07/26/2019. FINDINGS: Mediastinum and hilar structures normal. Cardiomegaly again noted. Normal pulmonary vascularity. Bilateral interstitial prominence again noted. These changes may be chronic. An active interstitial process including pneumonitis and or interstitial edema cannot be excluded. Low lung volumes. No pleural effusion or  pneumothorax. IMPRESSION: 1.  Cardiomegaly again noted.  No pulmonary venous congestion. 2. Bilateral interstitial prominence again noted. These changes may be chronic. Active interstitial process including pneumonitis and or interstitial edema cannot be excluded. 3.  Low lung volumes. Electronically Signed   By: Marcello Moores  Register   On: 03/07/2020 06:05      Assessment & Plan: Pt is a 54 y.o.   male with , was admitted on 03/07/2020 with Hyperglycemia [R73.9] Acute respiratory failure with hypoxia (HCC) [J96.01] Elevated troponin I level [R77.8] Non-sustained ventricular tachycardia (HCC) [I47.2] Atrial fibrillation with RVR (HCC) [I48.91] ESRD on hemodialysis ([place]) [N18.6, J00.9] Severe systolic congestive heart failure ([place]) [I50.20]   [facility]/MWF/[facility]/right arm AV fistula   #End-stage renal disease  patient was dialyzed yesterday. Electrolytes and volume status are acceptable.  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 Any transcription errors are unintentional

## 2020-03-07 NOTE — Progress Notes (Signed)
ANTICOAGULATION CONSULT NOTE - Initial Consult  Pharmacy Consult for Heparin infusion Indication: chest pain/ACS  Allergies  Allergen Reactions  . Penicillins Rash    Has patient had a PCN reaction causing immediate rash, facial/tongue/throat swelling, SOB or lightheadedness with hypotension: Yes Has patient had a PCN reaction causing severe rash involving mucus membranes or skin necrosis: Yes Has patient had a PCN reaction that required hospitalization: No Has patient had a PCN reaction occurring within the last 10 years: No If all of the above answers are "NO", then may proceed with Cephalosporin use.     Patient Measurements: Height: 6' (182.9 cm) Weight: 111 kg (244 lb 11.4 oz) IBW/kg (Calculated) : 77.6 Heparin Dosing Weight: 101.2 kg  Vital Signs: Temp: 98.4 F (36.9 C) (08/17 1141) Temp Source: Oral (08/17 1141) BP: 132/120 (08/17 1900) Pulse Rate: 131 (08/17 1900)  Labs: Recent Labs    03/07/20 0536 03/07/20 0805 03/07/20 1217 03/07/20 1910  HGB 11.3*  --   --   --   HCT 36.9*  --   --   --   PLT 190  --   --   --   APTT  --   --  84*  --   LABPROT 17.6*  --   --   --   INR 1.5*  --   --   --   HEPARINUNFRC  --   --   --  0.61  CREATININE 5.26*  --  5.43* 5.63*  TROPONINIHS 978* 2,030*  --   --     Estimated Creatinine Clearance: 19.8 mL/min (A) (by C-G formula based on SCr of 5.63 mg/dL (H)).   Medical History: Past Medical History:  Diagnosis Date  . CAD (coronary artery disease)    a. 09/2010 Cath/PCI ([facility]): LM nl, LAD 77m, D1 80 (small), LCX 28m, OM1 30, RI 70 (small), RCA 70 (DES).  . Coronary artery disease   . COVID-19 virus infection 07/2019  . Diabetes mellitus without complication ([place])   . ESRD (end stage renal disease) ([place])   . Hyperlipidemia   . Hypertension   . Ischemic cardiomyopathy    a.  12/2011 Echo ([facility]): NL EF, mod LVH. Mild AS/MS, triv PR/TR. 07/2019 Echo: EF 25-30%, GR1 DD, inf/post HK, low nl RV fxn, mildly dil RA, triv MR,  mild Ao sclerosis w/o stenosis.  . NSTEMI (non-ST elevated myocardial infarction) ([place]) 07/2019  . PAD (peripheral artery disease) ([place])    a. 03/2016 s/p PTA/DEB R SFA/popliteal/peroneal; b. 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 08/2017 Atherectomy/DEB dist L SFA/popliteal. PTA of L AT; d. 06/2018 PTA/DEB L SFA/popliteal/PT/AT; e. 01/2019 Stable ABIs.  . Sleep apnea     Assessment: Pharmacy has been consulted for heparin dosing and monitoring for a 54yo male who presents with possible NSTEMI. PMH significant for ESRD on HD (MWF), CAD, DM, and systolic CHF (EF 35-36%). Upon admission patient was in Afib with RVR. Per chart review, patient does not have anticoagulation prior to admission.   aPTT pending Hgb 11.3; Plt 190 INR slightly elevated on admission at 1.5 possible due to fluid overload from incomplete dialysis session / hepatic congestion / hemodynamic compromise  0817 1910 HL 0.61  Goal of Therapy:  Heparin level 0.3-0.7 units/ml Monitor platelets by anticoagulation protocol: Yes   Plan:  Continue heparin infusion at 1300 units/hr Check anti-Xa level in 8 hours and daily while on heparin Continue to monitor H&H and platelets  Lu Duffel, PharmD, BCPS Clinical Pharmacist  03/07/2020 8:25 PM

## 2020-03-07 NOTE — ED Provider Notes (Signed)
[facility] Emergency Department Provider Note  ____________________________________________   First MD Initiated Contact with Patient 03/07/20 [phone]     (approximate)  I have reviewed the triage vital signs and the nursing notes.   HISTORY  Chief Complaint Dizziness  Level 5 caveat:  history/ROS limited by acute/critical illness  HPI Ralph Peterson is a 54 y.o. male whose medical history includes but is not limited to end-stage renal disease on hemodialysis with dialysis on Mondays Wednesdays and Fridays, severe coronary artery disease, diabetes, and severe left systolic heart dysfunction with an EF of 25 to 30% (all of this information was verified in [facility]).  He presents for evaluation of shortness of breath  and generally feeling unwell.  The symptoms started during dialysis about 24 hours ago when he became acutely short of breath and they stopped to dialysis early.  He said the symptoms have been waxing and waning for about the last 24 hours.  He has had no chest pain but he has felt very short of breath at times and feels like his heart is racing.  He has been dizzy and lightheaded.  He denies fever/chills.  He has had nausea and some dry heaving.  He denies abdominal pain.  Nothing in particular makes the symptoms better or worse and they became severe overnight so he came to the emergency department.  Of note he reports that he had COVID-19 about 7 months ago and was in the hospital and also was told he had a heart attack at the time.  Upon arrival in triage his heart rate was in the 170s and he was brought immediately back to a room.        Past Medical History:  Diagnosis Date  . CAD (coronary artery disease)    a. 09/2010 Cath/PCI ([facility]): LM nl, LAD 62m, D1 80 (small), LCX 61m, OM1 30, RI 70 (small), RCA 70 (DES).  . Coronary artery disease   . COVID-19 virus infection 07/2019  . Diabetes mellitus without complication ([place])   . ESRD (end  stage renal disease) ([place])   . Hyperlipidemia   . Hypertension   . Ischemic cardiomyopathy    a.  12/2011 Echo ([facility]): NL EF, mod LVH. Mild AS/MS, triv PR/TR. 07/2019 Echo: EF 25-30%, GR1 DD, inf/post HK, low nl RV fxn, mildly dil RA, triv MR, mild Ao sclerosis w/o stenosis.  . NSTEMI (non-ST elevated myocardial infarction) ([place]) 07/2019  . PAD (peripheral artery disease) ([place])    a. 03/2016 s/p PTA/DEB R SFA/popliteal/peroneal; b. 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 08/2017 Atherectomy/DEB dist L SFA/popliteal. PTA of L AT; d. 06/2018 PTA/DEB L SFA/popliteal/PT/AT; e. 01/2019 Stable ABIs.  . Sleep apnea     Patient Active Problem List   Diagnosis Date Noted  . Acute respiratory failure with hypoxia ([place]) 03/07/2020  . COVID-19 07/27/2019  . Shortness of breath 07/27/2019  . Elevated troponin 07/27/2019  . Acute congestive heart failure ([place])   . Elevated lactic acid level   . Complication of vascular access for dialysis 07/30/2018  . Atherosclerosis of native arteries of extremity with rest pain ([place]) 06/16/2018  . Dialysis patient ([place]) 09/10/2016  . Toe gangrene ([place]) 03/20/2016  . CAD (coronary artery disease) 11/07/2011  . Diabetes mellitus ([place]) 11/07/2011  . ESRD (end stage renal disease) ([place]) 11/07/2011  . Hyperlipidemia, unspecified 11/07/2011  . Hypertension 11/07/2011  . Stented coronary artery 11/07/2011    Past Surgical History:  Procedure Laterality Date  .  ABDOMINAL AORTOGRAM N/A 09/10/2017   Procedure: ABDOMINAL AORTOGRAM;  Surgeon: Wellington Hampshire, MD;  Location: [place] [facility];  Service: Cardiovascular;  Laterality: N/A;  . AMPUTATION TOE Bilateral    one toe on right, all five toes on the left  . CARDIAC CATHETERIZATION  2012   [facility]; X1 stent 2.5 x 33 mm xience stent Distal TCA  . LOWER EXTREMITY ANGIOGRAPHY Bilateral 09/10/2017   Procedure: Lower Extremity Angiography;  Surgeon: Wellington Hampshire, MD;  Location: [place] [facility];   Service: Cardiovascular;  Laterality: Bilateral;  . LOWER EXTREMITY ANGIOGRAPHY Left 06/23/2018   Procedure: LOWER EXTREMITY ANGIOGRAPHY;  Surgeon: Katha Cabal, MD;  Location: [place] [facility];  Service: Cardiovascular;  Laterality: Left;  . LOWER EXTREMITY ANGIOGRAPHY Right 02/01/2020   Procedure: LOWER EXTREMITY ANGIOGRAPHY;  Surgeon: Katha Cabal, MD;  Location: [place] [facility];  Service: Cardiovascular;  Laterality: Right;  . PERIPHERAL VASCULAR ATHERECTOMY Left 09/10/2017   Procedure: PERIPHERAL VASCULAR ATHERECTOMY;  Surgeon: Wellington Hampshire, MD;  Location: [place] [facility];  Service: Cardiovascular;  Laterality: Left;  SFA/POPLITEAL  . PERIPHERAL VASCULAR BALLOON ANGIOPLASTY Left 09/10/2017   Procedure: PERIPHERAL VASCULAR BALLOON ANGIOPLASTY;  Surgeon: Wellington Hampshire, MD;  Location: [place] [facility];  Service: Cardiovascular;  Laterality: Left;  ANTERIAL TIBIAL  . PERIPHERAL VASCULAR CATHETERIZATION Right 03/26/2016   Procedure: Lower Extremity Angiography;  Surgeon: Katha Cabal, MD;  Location: [place] [facility];  Service: Cardiovascular;  Laterality: Right;  . PERIPHERAL VASCULAR INTERVENTION Left 09/10/2017   Procedure: PERIPHERAL VASCULAR INTERVENTION;  Surgeon: Wellington Hampshire, MD;  Location: [place] [facility];  Service: Cardiovascular;  Laterality: Left;  SFA/POPLITEAL    Prior to Admission medications   Medication Sig Start Date End Date Taking? Authorizing Provider  acetaminophen (TYLENOL) 500 MG tablet Take 1,000 mg by mouth 2 (two) times daily as needed for moderate pain.    [provider]  albuterol (VENTOLIN HFA) 108 (90 Base) MCG/ACT inhaler  02/24/20   [provider]  aspirin EC 81 MG tablet Take 81 mg by mouth every evening.    [provider]  atorvastatin (LIPITOR) 40 MG tablet Take 40 mg by mouth every evening.  03/31/18   [provider]  carvedilol (COREG) 25 MG tablet Take 25 mg by mouth 2 (two)  times daily with a meal.  06/18/13   [provider]  clopidogrel (PLAVIX) 75 MG tablet TAKE 1 TABLET BY MOUTH ONCE DAILY. Patient taking differently: Take 75 mg by mouth daily.  10/20/19   Minna Merritts, MD  diphenhydrAMINE (BENADRYL) 25 MG tablet Take 25 mg by mouth at bedtime as needed for allergies.    [provider]  doxycycline (VIBRAMYCIN) 100 MG capsule Take 100 mg by mouth 2 (two) times daily. 02/24/20   [provider]  escitalopram (LEXAPRO) 10 MG tablet Take 10 mg by mouth daily.     [provider]  fluticasone (FLONASE) 50 MCG/ACT nasal spray Place 2 sprays into both nostrils every evening.     [provider]  glipiZIDE (GLUCOTROL) 5 MG tablet Take 5 mg by mouth daily before breakfast.  01/01/19   [provider]  lisinopril (PRINIVIL,ZESTRIL) 2.5 MG tablet Take 2.5 mg by mouth every evening.  Patient not taking: Reported on 02/01/2020    [provider]  montelukast (SINGULAIR) 10 MG tablet Take 10 mg by mouth daily. 02/17/20   [provider]  predniSONE (  DELTASONE) 10 MG tablet Day 1-2: Take 50mg (5 pills) Day 3-4: Take 40mg (4) Day 5-6: 30mg (3) Day 7-8: 20mg (2) Day 9:10mg (1 03/03/20   Merlyn Lot, MD  sevelamer carbonate (RENVELA) 800 MG tablet Take 1,600-3,200 mg by mouth See admin instructions. Take 3200 mg with meals / 1600 mg with snacks    [provider]    Allergies Penicillins  Family History  Problem Relation Age of Onset  . Hypertension Mother     Social History Social History   Tobacco Use  . Smoking status: Former Smoker    Types: Cigarettes    Quit date: 2010    Years since quitting: 11.6  . Smokeless tobacco: Never Used  Vaping Use  . Vaping Use: Never used  Substance Use Topics  . Alcohol use: Not Currently  . Drug use: No    Review of Systems Constitutional: No fever/chills Eyes: No visual changes. ENT: No sore throat. Cardiovascular: Denies chest  pain. Respiratory: +shortness of breath. Gastrointestinal: No abdominal pain.  + Nausea and vomiting.  No diarrhea.  No constipation. Genitourinary: Negative for dysuria. Musculoskeletal: Negative for neck pain.  Negative for back pain. Integumentary: Negative for rash. Neurological: General dizziness.  Negative for headaches, focal weakness or numbness.   ____________________________________________   PHYSICAL EXAM:  VITAL SIGNS: ED Triage Vitals  Enc Vitals Group     BP 03/07/20 0538 (!) 120/102     Pulse Rate 03/07/20 0538 (!) 166     Resp 03/07/20 0538 (!) 26     Temp 03/07/20 0538 98.4 F (36.9 C)     Temp Source 03/07/20 0538 Oral     SpO2 03/07/20 0538 100 %     Weight 03/07/20 0527 111.1 kg (245 lb)     Height 03/07/20 0527 1.829 m (6')     Head Circumference --      Peak Flow --      Pain Score 03/07/20 0527 0     Pain Loc --      Pain Edu? --      Excl. in [place]? --     Constitutional: Alert and oriented but quite ill-appearing. Eyes: Conjunctivae are normal.  Head: Atraumatic. Nose: No congestion/rhinnorhea. Mouth/Throat: Patient is wearing a mask. Neck: No stridor.  No meningeal signs.   Cardiovascular: Rapid tachycardia in the 160s to 170s, variable rhythm with A. fib with RVR being predominant but also with nonsustained runs of ventricular tachycardia and possibly sinus tachycardia as well. Good peripheral circulation. Grossly normal heart sounds.  Dialysis access is an right upper extremity with a palpable thrill. Respiratory: Normal respiratory effort.  No retractions. Gastrointestinal: Soft and nontender. No distention.  Musculoskeletal: No lower extremity tenderness nor edema. No gross deformities of extremities. Neurologic:  Normal speech and language. No gross focal neurologic deficits are appreciated.  Skin:  Pallor.  Skin is warm and mildly diaphoretic. Psychiatric: Mood and affect are normal. Speech and behavior are  normal.  ____________________________________________   LABS (all labs ordered are listed, but only abnormal results are displayed)  Labs Reviewed  CBC WITH DIFFERENTIAL/PLATELET - Abnormal; Notable for the following components:      Result Value   WBC 11.7 (*)    RBC 3.87 (*)    Hemoglobin 11.3 (*)    HCT 36.9 (*)    RDW 16.1 (*)    Neutro Abs 8.9 (*)    Abs Immature Granulocytes 0.24 (*)    All other components within normal limits  BRAIN NATRIURETIC PEPTIDE - Abnormal;  Notable for the following components:   B Natriuretic Peptide 3,819.1 (*)    All other components within normal limits  COMPREHENSIVE METABOLIC PANEL - Abnormal; Notable for the following components:   Sodium 125 (*)    Chloride 83 (*)    Glucose, Bld 792 (*)    BUN 41 (*)    Creatinine, Ser 5.26 (*)    Calcium 8.2 (*)    Total Protein 6.1 (*)    Albumin 3.2 (*)    Alkaline Phosphatase 169 (*)    GFR calc non Af Amer 12 (*)    GFR calc Af Amer 13 (*)    Anion gap 16 (*)    All other components within normal limits  PROTIME-INR - Abnormal; Notable for the following components:   Prothrombin Time 17.6 (*)    INR 1.5 (*)    All other components within normal limits  GLUCOSE, CAPILLARY - Abnormal; Notable for the following components:   Glucose-Capillary >600 (*)    All other components within normal limits  TSH - Abnormal; Notable for the following components:   TSH 5.305 (*)    All other components within normal limits  TROPONIN I (HIGH SENSITIVITY) - Abnormal; Notable for the following components:   Troponin I (High Sensitivity) 978 (*)    All other components within normal limits  SARS CORONAVIRUS 2 BY RT PCR (HOSPITAL ORDER, [facility])  CULTURE, BLOOD (ROUTINE X 2)  CULTURE, BLOOD (ROUTINE X 2)  MAGNESIUM  BLOOD GAS, VENOUS  PROCALCITONIN  BETA-HYDROXYBUTYRIC ACID  URINALYSIS, ROUTINE W REFLEX MICROSCOPIC  APTT  PHOSPHORUS  BASIC METABOLIC PANEL  HEPARIN LEVEL  (UNFRACTIONATED)  CBG MONITORING, ED  TROPONIN I (HIGH SENSITIVITY)   ____________________________________________  EKG  ED ECG REPORT #1 I, Hinda Kehr, the attending physician, personally viewed and interpreted this ECG.  Date: 03/07/2020 EKG Time: 05:24 Rate: 164 Rhythm: atrial fibrillation with RVR and right bundle branch block QRS Axis: LAD Intervals: Prolonged QRS interval and substantially prolonged QTc interval (greater than 600 ms) ST/T Wave abnormalities: Non-specific ST segment / T-wave changes, Likely rate related ischemia  Narrative Interpretation: Concerning EKG but does not meet criteria for STEMI  ED ECG REPORT #2 I, Hinda Kehr, the attending physician, personally viewed and interpreted this ECG.  Date: 03/07/2020 EKG Time: 05:39 Rate: 153 Rhythm: A. fib with RVR and RBBB QRS Axis: LAD Intervals: Prolonged QRS interval and substantially prolonged QTc interval  ST/T Wave abnormalities: Non-specific ST segment / T-wave changes, Likely rate related ischemia  Narrative Interpretation: Concerning EKG but does not meet criteria for STEMI  ED ECG REPORT #3 I, Hinda Kehr, the attending physician, personally viewed and interpreted this ECG.  Date: 03/07/2020 EKG Time: 05:40 Rate: 160 Rhythm: A. fib with RVR and RBBB QRS Axis: LAD Intervals: Prolonged QRS interval and substantially prolonged QTc interval  ST/T Wave abnormalities: Non-specific ST segment / T-wave changes, Likely rate related ischemia  Narrative Interpretation: Concerning EKG but does not meet criteria for STEMI  ED ECG REPORT #4 I, Hinda Kehr, the attending physician, personally viewed and interpreted this ECG.  Date: 03/07/2020 EKG Time: 05:48 Rate: 150 Rhythm: A. fib with RVR and RBBB QRS Axis: LAD Intervals: Prolonged QRS interval and substantially prolonged QTc interval  ST/T Wave abnormalities: Non-specific ST segment / T-wave changes, Likely rate related ischemia  Narrative  Interpretation: Concerning EKG but does not meet criteria for STEMI  ED ECG REPORT #5 I, Hinda Kehr, the attending physician, personally  viewed and interpreted this ECG.  Date: 03/07/2020 EKG Time: 06:03 Rate: 143 Rhythm: A. fib with RVR and RBBB QRS Axis: LAD Intervals: Prolonged QRS interval and substantially prolonged QTc interval  ST/T Wave abnormalities: Non-specific ST segment / T-wave changes, Likely rate related ischemia  Narrative Interpretation: Concerning EKG but does not meet criteria for STEMI  ED ECG REPORT #6 I, Hinda Kehr, the attending physician, personally viewed and interpreted this ECG.  Date: 03/07/2020 EKG Time: 06:11 Rate: 151 Rhythm: A. fib with RVR and RBBB QRS Axis: LAD Intervals: Prolonged QRS interval and substantially prolonged QTc interval  ST/T Wave abnormalities: Non-specific ST segment / T-wave changes, Likely rate related ischemia  Narrative Interpretation: Concerning EKG but does not meet criteria for STEMI  ED ECG REPORT #7 I, Hinda Kehr, the attending physician, personally viewed and interpreted this ECG.  Date: 03/07/2020 EKG Time: 06:18 Rate: 135 Rhythm: A. fib with RVR and RBBB QRS Axis: LAD Intervals: Prolonged QRS interval and substantially prolonged QTc interval  ST/T Wave abnormalities: Non-specific ST segment / T-wave changes, Likely rate related ischemia  Narrative Interpretation: Concerning EKG but does not meet criteria for STEMI  ED ECG REPORT #8 I, Hinda Kehr, the attending physician, personally viewed and interpreted this ECG.  Date: 03/07/2020 EKG Time: 06:19 Rate: 135 Rhythm: A. fib with RVR and RBBB QRS Axis: LAD Intervals: Prolonged QRS interval and substantially prolonged QTc interval  ST/T Wave abnormalities: Non-specific ST segment / T-wave changes, Likely rate related ischemia  Narrative Interpretation: Concerning EKG but does not meet criteria for  STEMI   ____________________________________________  RADIOLOGY I, Hinda Kehr, personally viewed and evaluated these images (plain radiographs) as part of my medical decision making, as well as reviewing the written report by the radiologist.  ED MD interpretation: Chronic cardiomegaly with no evidence of pulmonary vascular congestion.  Official radiology report(s): DG Chest Portable 1 View  Result Date: 03/07/2020 CLINICAL DATA:  Acute shortness of breath.  Arrhythmia. EXAM: PORTABLE CHEST 1 VIEW COMPARISON:  03/03/2020.  07/26/2019. FINDINGS: Mediastinum and hilar structures normal. Cardiomegaly again noted. Normal pulmonary vascularity. Bilateral interstitial prominence again noted. These changes may be chronic. An active interstitial process including pneumonitis and or interstitial edema cannot be excluded. Low lung volumes. No pleural effusion or pneumothorax. IMPRESSION: 1.  Cardiomegaly again noted.  No pulmonary venous congestion. 2. Bilateral interstitial prominence again noted. These changes may be chronic. Active interstitial process including pneumonitis and or interstitial edema cannot be excluded. 3.  Low lung volumes. Electronically Signed   By: Marcello Moores  Register   On: 03/07/2020 06:05    ____________________________________________   PROCEDURES   Procedure(s) performed (including Critical Care):  .1-3 Lead EKG Interpretation Performed by: Hinda Kehr, MD Authorized by: Hinda Kehr, MD     Interpretation: abnormal     ECG rate:  170   ECG rate assessment: tachycardic     Rhythm: atrial flutter     Ectopy: none     Conduction: normal   .Critical Care Performed by: Hinda Kehr, MD Authorized by: Hinda Kehr, MD   Critical care provider statement:    Critical care time (minutes):  60   Critical care time was exclusive of:  Separately billable procedures and treating other patients   Critical care was necessary to treat or prevent imminent or  life-threatening deterioration of the following conditions:  Cardiac failure and circulatory failure   Critical care was time spent personally by me on the following activities:  Development of treatment plan with  patient or surrogate, discussions with consultants, evaluation of patient's response to treatment, examination of patient, obtaining history from patient or surrogate, ordering and performing treatments and interventions, ordering and review of laboratory studies, ordering and review of radiographic studies, pulse oximetry, re-evaluation of patient's condition and review of old charts     ____________________________________________   [place] / MDM / [place] / ED COURSE  As part of my medical decision making, I reviewed the following data within the [place] notes reviewed and incorporated, Labs reviewed , EKG interpreted , Old EKG reviewed, Old chart reviewed, Radiograph reviewed , Discussed with cardiology (Dr. Clayborn Bigness), Discussed with admitting provider Darel Hong with the ICU), and reviewed Notes from prior ED visits   Differential diagnosis includes, but is not limited to, electrolyte abnormality, volume depletion, volume overload, ACS, PE, other nonspecific cause for cardiac arrhythmia.  The patient presents looking quite ill and was placed immediately in a room, placed on the cardiac monitor and pulse oximeter, and had anterior and posterior cardiac pads placed and he is being monitored on the [place].  I was alerted to his placement in the exam room and went to see him immediately.  I quite clearly saw multiple runs of nonsustained but prolonged ventricular tachycardia.  Additionally his initial EKG demonstrates substantially prolonged QTc interval, more so than on his prior EKG from 4 days ago, and QRS widening as well.  I am strongly suspicious for hyperkalemia.  I reviewed the medical record quickly and see his history of heart  failure and I am concerned about fluid overload and I will hold off on a bolus at this time.  Given his highly arrhythmogenic cardiac function at this time and nonsustained ventricular tachycardia as well as his pallor and overall clinical appearance, I ordered an amiodarone 150 mg IV bolus followed by amiodarone infusion.  I also ordered calcium gluconate 1 g IV and magnesium 2 g IV.  We have ample IV access in the arm that does not have his dialysis access.  Patient tested negative for COVID-19 4 days ago but I will reswab him anticipating the need for admission.  Chest x-ray shows no acute findings.  Blood pressure is normotensive at this time.  The patient is on the cardiac monitor to evaluate for evidence of arrhythmia and/or significant heart rate changes.     Clinical Course as of Mar 08 747  Tue Mar 07, 2020  9030 The patient appears more pallorous than before, but he states he feels better than he did previously.  He is still tachycardic with a wide-complex QRS and prolonged QTc interval.  He is also still having runs of nonsustained ventricular tachycardia.  I have ordered a second bolus of amiodarone 150 mg IV and will continue the infusion.  Lab work is still pending.  CBC is generally unremarkable.   [CF]  0923 Called lab and asked chemistry tech to call me immediately with results given my concern for hyperkalemia.  She confirmed she will do so, should have results in about 5 minutes.   [CF]  [id number] I called and spoke by phone with Dr. Clayborn Bigness with cardiology.  We discussed the case in detail.  He agreed with my management thus far including the amiodarone boluses and infusion and the electrolytes including calcium and magnesium.  He also agreed with the need to treat the hypoglycemia.  This is likely compounded substantially by the prednisone he was recently put on for his bronchitis and  he does not appear to be in DKA but may be contributing to his symptoms.  I ordered the hypoglycemia  protocol for insulin infusion and I have ordered only 500 mL normal saline bolus after discussing it with Dr. Clayborn Bigness given the concerns for volume overload in this patient with severe systolic heart dysfunction and end-stage renal disease on dialysis.   [CF]  [id number] B Natriuretic Peptide(!): 3,819.1 [CF]  2297 Discussed case with Darlyn Chamber with the ICU and stressed the patient's acute and critical illness and the need to get him to the ICU.  Darlyn Chamber understands, agrees with the management thus far, and will put in admission orders, I have asked my charge nurse to discuss the case with the ICU charge nurse to expedite transfer to the ICU.   [CF]    Clinical Course User Index [CF] Hinda Kehr, MD     ____________________________________________  FINAL CLINICAL IMPRESSION(S) / ED DIAGNOSES  Final diagnoses:  Atrial fibrillation with RVR ([place])  Elevated troponin I level  Non-sustained ventricular tachycardia ([place])  Hyperglycemia  Severe systolic congestive heart failure ([place])  ESRD on hemodialysis ([place])     MEDICATIONS GIVEN DURING THIS VISIT:  Medications  amiodarone (NEXTERONE PREMIX) 360-4.14 MG/200ML-% (1.8 mg/mL) IV infusion (60 mg/hr Intravenous New Bag/Given 03/07/20 0559)  amiodarone (NEXTERONE PREMIX) 360-4.14 MG/200ML-% (1.8 mg/mL) IV infusion (has no administration in time range)  albuterol (PROVENTIL) (2.5 MG/3ML) 0.083% nebulizer solution 5 mg (0 mg Nebulization Hold 03/07/20 0631)  insulin regular, human (MYXREDLIN) 100 units/ 100 mL infusion (14 Units/hr Intravenous New Bag/Given 03/07/20 0648)  lactated ringers infusion (has no administration in time range)  dextrose 5 % in lactated ringers infusion (has no administration in time range)  dextrose 50 % solution 0-50 mL (has no administration in time range)  docusate sodium (COLACE) capsule 100 mg (has no administration in time range)  polyethylene glycol (MIRALAX / GLYCOLAX) packet 17 g (has no administration in time  range)  aspirin EC tablet 325 mg (has no administration in time range)  heparin bolus via infusion 4,000 Units (has no administration in time range)    Followed by  heparin ADULT infusion 100 units/mL (25000 units/253mL sodium chloride 0.45%) (has no administration in time range)  amiodarone (NEXTERONE) 150-4.21 MG/100ML-% bolus (  Stopped 03/07/20 0559)  calcium gluconate inj 10% (1 g) URGENT USE ONLY! (1 g Intravenous Given 03/07/20 0557)  magnesium sulfate IVPB 2 g 50 mL (0 g Intravenous Stopped 03/07/20 0701)  amiodarone (NEXTERONE) IV bolus only 150 mg/100 mL (0 mg Intravenous Stopped 03/07/20 0625)  sodium chloride 0.9 % bolus 500 mL (0 mLs Intravenous Stopped 03/07/20 9892)     ED Discharge Orders    None      *Please note:  Ralph Peterson was evaluated in Emergency Department on 03/07/2020 for the symptoms described in the history of present illness. He was evaluated in the context of the global COVID-19 pandemic, which necessitated consideration that the patient might be at risk for infection with the SARS-CoV-2 virus that causes COVID-19. Institutional protocols and algorithms that pertain to the evaluation of patients at risk for COVID-19 are in a state of rapid change based on information released by regulatory bodies including the CDC and federal and state organizations. These policies and algorithms were followed during the patient's care in the ED.  Some ED evaluations and interventions may be delayed as a result of limited staffing during and after the pandemic.*  Note:  This document was prepared using Dragon  voice recognition software and may include unintentional dictation errors.   Hinda Kehr, MD 03/07/20 [phone]

## 2020-03-07 NOTE — Progress Notes (Addendum)
ANTICOAGULATION CONSULT NOTE - Initial Consult  Pharmacy Consult for Heparin infusion Indication: chest pain/ACS  Allergies  Allergen Reactions  . Penicillins Rash    Has patient had a PCN reaction causing immediate rash, facial/tongue/throat swelling, SOB or lightheadedness with hypotension: Yes Has patient had a PCN reaction causing severe rash involving mucus membranes or skin necrosis: Yes Has patient had a PCN reaction that required hospitalization: No Has patient had a PCN reaction occurring within the last 10 years: No If all of the above answers are "NO", then may proceed with Cephalosporin use.     Patient Measurements: Height: 6' (182.9 cm) Weight: 111.1 kg (245 lb) IBW/kg (Calculated) : 77.6 Heparin Dosing Weight: 101.2 kg  Vital Signs: Temp: 98.4 F (36.9 C) (08/17 0538) Temp Source: Oral (08/17 0538) BP: 112/94 (08/17 0716) Pulse Rate: 102 (08/17 0716)  Labs: Recent Labs    03/07/20 0536  HGB 11.3*  HCT 36.9*  PLT 190  LABPROT 17.6*  INR 1.5*  CREATININE 5.26*  TROPONINIHS 978*    Estimated Creatinine Clearance: 21.1 mL/min (A) (by C-G formula based on SCr of 5.26 mg/dL (H)).   Medical History: Past Medical History:  Diagnosis Date  . CAD (coronary artery disease)    a. 09/2010 Cath/PCI ([facility]): LM nl, LAD 45m, D1 80 (small), LCX 68m, OM1 30, RI 70 (small), RCA 70 (DES).  . Coronary artery disease   . COVID-19 virus infection 07/2019  . Diabetes mellitus without complication ([place])   . ESRD (end stage renal disease) ([place])   . Hyperlipidemia   . Hypertension   . Ischemic cardiomyopathy    a.  12/2011 Echo ([facility]): NL EF, mod LVH. Mild AS/MS, triv PR/TR. 07/2019 Echo: EF 25-30%, GR1 DD, inf/post HK, low nl RV fxn, mildly dil RA, triv MR, mild Ao sclerosis w/o stenosis.  . NSTEMI (non-ST elevated myocardial infarction) ([place]) 07/2019  . PAD (peripheral artery disease) ([place])    a. 03/2016 s/p PTA/DEB R SFA/popliteal/peroneal; b. 2017 s/p amputation of R  3rd toe; c. 08/2017 Atherectomy/DEB dist L SFA/popliteal. PTA of L AT; d. 06/2018 PTA/DEB L SFA/popliteal/PT/AT; e. 01/2019 Stable ABIs.  . Sleep apnea     Assessment: Pharmacy has been consulted for heparin dosing and monitoring for a 54yo male who presents with possible NSTEMI. PMH significant for ESRD on HD (MWF), CAD, DM, and systolic CHF (EF 69-48%). Upon admission patient was in Afib with RVR. Per chart review, patient does not have anticoagulation prior to admission.   aPTT pending Hgb 11.3; Plt 190 INR slightly elevated on admission at 1.5 possible due to fluid overload from incomplete dialysis session / hepatic congestion / hemodynamic compromise  Goal of Therapy:  Heparin level 0.3-0.7 units/ml Monitor platelets by anticoagulation protocol: Yes   Plan:  Give 4000 units bolus x 1 Start heparin infusion at 1300 units/hr Check anti-Xa level in 8 hours and daily while on heparin Continue to monitor H&H and platelets  Sherilyn Banker, PharmD Pharmacy Resident  03/07/2020 7:55 AM

## 2020-03-07 NOTE — ED Notes (Signed)
Pt O2 sats dropped to 87% 2L [place]. Increased to 3Lnc. Back up to 100%

## 2020-03-07 NOTE — ED Notes (Signed)
Report given to Brandy RN

## 2020-03-07 NOTE — ED Triage Notes (Signed)
Pt to triage via w/c; st dialysis Mon am, stopped 46min prior to completion due to onset dizziness; st dizziness, weakness persists; seen Fri and dx with bronchitis, SHOB persists with "very little cough" and nausea

## 2020-03-08 ENCOUNTER — Encounter (INDEPENDENT_AMBULATORY_CARE_PROVIDER_SITE_OTHER): Payer: Medicare PPO

## 2020-03-08 ENCOUNTER — Ambulatory Visit (INDEPENDENT_AMBULATORY_CARE_PROVIDER_SITE_OTHER): Payer: Medicare PPO | Admitting: Nurse Practitioner

## 2020-03-08 DIAGNOSIS — I4891 Unspecified atrial fibrillation: Secondary | ICD-10-CM | POA: Diagnosis present

## 2020-03-08 LAB — GLUCOSE, CAPILLARY
Glucose-Capillary: 115 mg/dL — ABNORMAL HIGH (ref 70–99)
Glucose-Capillary: 140 mg/dL — ABNORMAL HIGH (ref 70–99)
Glucose-Capillary: 162 mg/dL — ABNORMAL HIGH (ref 70–99)
Glucose-Capillary: 222 mg/dL — ABNORMAL HIGH (ref 70–99)
Glucose-Capillary: 263 mg/dL — ABNORMAL HIGH (ref 70–99)
Glucose-Capillary: 86 mg/dL (ref 70–99)

## 2020-03-08 LAB — CBC WITH DIFFERENTIAL/PLATELET
Abs Immature Granulocytes: 0.22 10*3/uL — ABNORMAL HIGH (ref 0.00–0.07)
Basophils Absolute: 0 10*3/uL (ref 0.0–0.1)
Basophils Relative: 0 %
Eosinophils Absolute: 0 10*3/uL (ref 0.0–0.5)
Eosinophils Relative: 0 %
HCT: 38 % — ABNORMAL LOW (ref 39.0–52.0)
Hemoglobin: 11.8 g/dL — ABNORMAL LOW (ref 13.0–17.0)
Immature Granulocytes: 1 %
Lymphocytes Relative: 11 %
Lymphs Abs: 2 10*3/uL (ref 0.7–4.0)
MCH: 29.2 pg (ref 26.0–34.0)
MCHC: 31.1 g/dL (ref 30.0–36.0)
MCV: 94.1 fL (ref 80.0–100.0)
Monocytes Absolute: 1 10*3/uL (ref 0.1–1.0)
Monocytes Relative: 6 %
Neutro Abs: 14.2 10*3/uL — ABNORMAL HIGH (ref 1.7–7.7)
Neutrophils Relative %: 82 %
Platelets: 182 10*3/uL (ref 150–400)
RBC: 4.04 MIL/uL — ABNORMAL LOW (ref 4.22–5.81)
RDW: 15.9 % — ABNORMAL HIGH (ref 11.5–15.5)
WBC: 17.5 10*3/uL — ABNORMAL HIGH (ref 4.0–10.5)
nRBC: 0.1 % (ref 0.0–0.2)

## 2020-03-08 LAB — RENAL FUNCTION PANEL
Albumin: 3 g/dL — ABNORMAL LOW (ref 3.5–5.0)
Anion gap: 18 — ABNORMAL HIGH (ref 5–15)
BUN: 51 mg/dL — ABNORMAL HIGH (ref 6–20)
CO2: 22 mmol/L (ref 22–32)
Calcium: 8.7 mg/dL — ABNORMAL LOW (ref 8.9–10.3)
Chloride: 89 mmol/L — ABNORMAL LOW (ref 98–111)
Creatinine, Ser: 5.98 mg/dL — ABNORMAL HIGH (ref 0.61–1.24)
GFR calc Af Amer: 11 mL/min — ABNORMAL LOW (ref >60)
GFR calc non Af Amer: 10 mL/min — ABNORMAL LOW (ref >60)
Glucose, Bld: 122 mg/dL — ABNORMAL HIGH (ref 70–99)
Phosphorus: 4.2 mg/dL (ref 2.5–4.6)
Potassium: 4.4 mmol/L (ref 3.5–5.1)
Sodium: 129 mmol/L — ABNORMAL LOW (ref 135–145)

## 2020-03-08 LAB — ECHOCARDIOGRAM COMPLETE
Area-P 1/2: 7.41 cm2
Height: 72 in
S' Lateral: 4.52 cm
Weight: 3920 oz

## 2020-03-08 LAB — PROCALCITONIN: Procalcitonin: 1.56 ng/mL

## 2020-03-08 LAB — HEPARIN LEVEL (UNFRACTIONATED): Heparin Unfractionated: 0.42 IU/mL (ref 0.30–0.70)

## 2020-03-08 LAB — MAGNESIUM: Magnesium: 2.6 mg/dL — ABNORMAL HIGH (ref 1.7–2.4)

## 2020-03-08 MED ORDER — SEVELAMER CARBONATE 800 MG PO TABS
3200.0000 mg | ORAL_TABLET | Freq: Three times a day (TID) | ORAL | Status: DC
  Administered 2020-03-08 – 2020-03-10 (×5): 3200 mg via ORAL
  Filled 2020-03-08 (×6): qty 4

## 2020-03-08 MED ORDER — INSULIN ASPART 100 UNIT/ML ~~LOC~~ SOLN
0.0000 [IU] | Freq: Every day | SUBCUTANEOUS | Status: DC
  Administered 2020-03-08: 3 [IU] via SUBCUTANEOUS
  Filled 2020-03-08: qty 1

## 2020-03-08 MED ORDER — ASPIRIN EC 81 MG PO TBEC: 81.0000 mg | DELAYED_RELEASE_TABLET | Freq: Every evening | ORAL | Status: DC

## 2020-03-08 MED ORDER — SEVELAMER CARBONATE 800 MG PO TABS: 1600.0000 mg | ORAL_TABLET | ORAL | Status: DC

## 2020-03-08 MED ORDER — ESCITALOPRAM OXALATE 10 MG PO TABS
20.0000 mg | ORAL_TABLET | Freq: Every day | ORAL | Status: DC
  Administered 2020-03-08 – 2020-03-10 (×3): 20 mg via ORAL
  Filled 2020-03-08 (×3): qty 2

## 2020-03-08 MED ORDER — GLIPIZIDE 5 MG PO TABS
5.0000 mg | ORAL_TABLET | Freq: Every day | ORAL | Status: DC
  Administered 2020-03-09 – 2020-03-10 (×2): 5 mg via ORAL
  Filled 2020-03-08 (×3): qty 1

## 2020-03-08 MED ORDER — INSULIN ASPART 100 UNIT/ML ~~LOC~~ SOLN
0.0000 [IU] | Freq: Three times a day (TID) | SUBCUTANEOUS | Status: DC
  Administered 2020-03-08: 1 [IU] via SUBCUTANEOUS
  Administered 2020-03-09 (×2): 2 [IU] via SUBCUTANEOUS
  Filled 2020-03-08 (×4): qty 1

## 2020-03-08 MED ORDER — PREDNISONE 10 MG PO TABS: 10.0000 mg | ORAL_TABLET | Freq: Every day | ORAL | Status: DC

## 2020-03-08 MED ORDER — MONTELUKAST SODIUM 10 MG PO TABS
10.0000 mg | ORAL_TABLET | Freq: Every day | ORAL | Status: DC
  Administered 2020-03-08 – 2020-03-10 (×3): 10 mg via ORAL
  Filled 2020-03-08 (×3): qty 1

## 2020-03-08 MED ORDER — ATORVASTATIN CALCIUM 20 MG PO TABS
40.0000 mg | ORAL_TABLET | Freq: Every evening | ORAL | Status: DC
  Administered 2020-03-08 – 2020-03-09 (×2): 40 mg via ORAL
  Filled 2020-03-08 (×2): qty 2

## 2020-03-08 MED ORDER — CINACALCET HCL 30 MG PO TABS
30.0000 mg | ORAL_TABLET | Freq: Every day | ORAL | Status: DC
  Administered 2020-03-08 – 2020-03-10 (×3): 30 mg via ORAL
  Filled 2020-03-08 (×3): qty 1

## 2020-03-08 MED ORDER — FLUTICASONE PROPIONATE 50 MCG/ACT NA SUSP
2.0000 | Freq: Every evening | NASAL | Status: DC
  Administered 2020-03-08: 2 via NASAL
  Filled 2020-03-08: qty 16

## 2020-03-08 MED ORDER — SEVELAMER CARBONATE 800 MG PO TABS
800.0000 mg | ORAL_TABLET | ORAL | Status: DC
  Administered 2020-03-09: 800 mg via ORAL

## 2020-03-08 MED ORDER — CARVEDILOL 25 MG PO TABS
25.0000 mg | ORAL_TABLET | Freq: Two times a day (BID) | ORAL | Status: DC
  Administered 2020-03-08: 25 mg via ORAL
  Filled 2020-03-08: qty 2

## 2020-03-08 MED ORDER — SODIUM CHLORIDE 0.9% FLUSH
10.0000 mL | Freq: Two times a day (BID) | INTRAVENOUS | Status: DC
  Administered 2020-03-08 – 2020-03-10 (×3): 10 mL via INTRAVENOUS

## 2020-03-08 NOTE — Progress Notes (Signed)
[address] [place], [place] [address] Phone [phone]. Fax [phone]  Date: 03/08/2020                  Patient Name:  Ralph Peterson  MRN: [id number]  DOB: 10/31/1965  Age / Sex: 54 y.o., male         PCP: Vidal Schwalbe, MD                 Service Requesting Consult: IM/ Deatra James, MD                 Reason for Consult: ESRD            History of Present Illness: Patient is a 54 y.o. male with medical problems of end-stage renal disease, on dialysis for 8 years, diabetes diagnosed at age 88, hypertension, coronary disease with stent, diabetic foot ulcer, who was admitted to [facility] on 03/07/2020 for evaluation of dizziness after hemodialysis was not resolved.   Patient reports that during the last 30 minutes of his hemodialysis yesterday, he felt dizzy.  He did not get better in the evening at home.  He felt fatigued.  He reports he was evaluated for shortness of breath and diagnosed with bronchitis last Friday.  He was prescribed prednisone.  Since then his blood sugars have been running high.  In the ER, his blood sugar was in the 700s. Therefore, he is being admitted for further evaluation.  Patient reports feeling better today, still has A Fib with RVR and is currently on IV amiodarone and heparin drips.   Patient is for dialysis today at bedside.    Current medications: Current Facility-Administered Medications  Medication Dose Route Frequency Provider Last Rate Last Admin  . albuterol (PROVENTIL) (2.5 MG/3ML) 0.083% nebulizer solution 5 mg  5 mg Nebulization Once Hinda Kehr, MD      . amiodarone (NEXTERONE PREMIX) 360-4.14 MG/200ML-% (1.8 mg/mL) IV infusion  60 mg/hr Intravenous Continuous Awilda Bill, NP 33.3 mL/hr at 03/08/20 1200 60 mg/hr at 03/08/20 1200  . aspirin EC tablet 325 mg  325 mg Oral Daily Darel Hong D, NP   325 mg at 03/08/20 0836  . atorvastatin (LIPITOR) tablet 40 mg  40 mg Oral QPM Shahmehdi, Seyed A, MD       . carvedilol (COREG) tablet 25 mg  25 mg Oral BID WC Shahmehdi, Seyed A, MD      . Chlorhexidine Gluconate Cloth 2 % PADS 6 each  6 each Topical Q0600 Murlean Iba, MD      . cinacalcet (SENSIPAR) tablet 30 mg  30 mg Oral Daily Shahmehdi, Seyed A, MD      . dextrose 50 % solution 0-50 mL  0-50 mL Intravenous PRN Hinda Kehr, MD      . docusate sodium (COLACE) capsule 100 mg  100 mg Oral BID PRN Darel Hong D, NP      . escitalopram (LEXAPRO) tablet 20 mg  20 mg Oral Daily Shahmehdi, Seyed A, MD      . fluticasone (FLONASE) 50 MCG/ACT nasal spray 2 spray  2 spray Each Nare QPM Shahmehdi, Seyed A, MD      . Derrill Memo ON 03/09/2020] glipiZIDE (GLUCOTROL) tablet 5 mg  5 mg Oral QAC breakfast Shahmehdi, Seyed A, MD      . heparin ADULT infusion 100 units/mL (25000 units/246mL sodium chloride 0.45%)  1,300 Units/hr Intravenous Continuous Rauer, Samantha O, RPH 13 mL/hr at 03/08/20 1200 1,300 Units/hr at 03/08/20 1200  .  hydrALAZINE (APRESOLINE) injection 10-20 mg  10-20 mg Intravenous Q4H PRN Awilda Bill, NP      . insulin aspart (novoLOG) injection 0-5 Units  0-5 Units Subcutaneous QHS Awilda Bill, NP      . insulin aspart (novoLOG) injection 0-6 Units  0-6 Units Subcutaneous TID WC Awilda Bill, NP   1 Units at 03/08/20 [phone]  . insulin detemir (LEVEMIR) injection 5 Units  5 Units Subcutaneous BID Awilda Bill, NP   5 Units at 03/08/20 0418  . MEDLINE mouth rinse  15 mL Mouth Rinse BID Flora Lipps, MD   15 mL at 03/08/20 0846  . metoprolol tartrate (LOPRESSOR) injection 5 mg  5 mg Intravenous Q2H PRN Callwood, Dwayne D, MD   5 mg at 03/07/20 2025  . montelukast (SINGULAIR) tablet 10 mg  10 mg Oral Daily Shahmehdi, Seyed A, MD      . polyethylene glycol (MIRALAX / GLYCOLAX) packet 17 g  17 g Oral Daily PRN Bradly Bienenstock, NP      . Derrill Memo ON 03/09/2020] predniSONE (DELTASONE) tablet 10 mg  10 mg Oral Q breakfast Shahmehdi, Seyed A, MD      . sevelamer carbonate (RENVELA) tablet  3,200 mg  3,200 mg Oral TID WC Shahmehdi, Seyed A, MD       And  . sevelamer carbonate (RENVELA) tablet 800 mg  800 mg Oral With snacks Shahmehdi, Seyed A, MD          Vital Signs: Blood pressure (!) 126/110, pulse (!) 127, temperature 97.9 F (36.6 C), temperature source Oral, resp. rate 18, height 6' (1.829 m), weight 115.7 kg, SpO2 99 %.   Intake/Output Summary (Last 24 hours) at 03/08/2020 1334 Last data filed at 03/08/2020 1200 Gross per 24 hour  Intake 2316.18 ml  Output --  Net 2316.18 ml    Weight trends: Filed Weights   03/07/20 0527 03/07/20 1141 03/08/20 0419  Weight: 111.1 kg 111 kg 115.7 kg   Physical Exam: General:  Well appearing,No acute distress, lying in the bed  HEENT  Normocephalic,anicteric, Mucous membranes are moist  Pulm/lungs  CTABL,No accessory muscle use.  CVS/Heart  Irregular ventricular rhythm with HR 90's to 110's on heart monitor, no rub or gallop  Abdomen:   Soft, non tender,non distended  Extremities:  + peripheral edema, rt foot with dressing and boot on  Neurologic:  Alert, oriented, able to follow commands,speech clear  Skin:  Warm,dry.No acute lesions or rashes.Good texture and turgor  Rt Arm AVF   Lab results: Basic Metabolic Panel: Recent Labs  Lab 03/07/20 0536 03/07/20 0805 03/07/20 1217 03/07/20 1910 03/07/20 2235 03/08/20 0241  NA 125*  --    < > 133* 132* 129*  K 4.5  --    < > 4.3 4.3 4.4  CL 83*  --    < > 89* 88* 89*  CO2 26  --    < > 27 25 22   GLUCOSE 792*  --    < > 160* 87 122*  BUN 41*  --    < > 47* 49* 51*  CREATININE 5.26*  --    < > 5.63* 6.20* 5.98*  CALCIUM 8.2*  --    < > 8.5* 8.9 8.7*  MG 2.4  --   --   --   --  2.6*  PHOS  --  3.3  --   --   --  4.2   < > = values in this  interval not displayed.    Liver Function Tests: Recent Labs  Lab 03/07/20 0536 03/07/20 0536 03/08/20 0241  AST 20  --   --   ALT 37  --   --   ALKPHOS 169*  --   --   BILITOT 1.1  --   --   PROT 6.1*  --   --   ALBUMIN  3.2*   < > 3.0*   < > = values in this interval not displayed.   No results for input(s): LIPASE, AMYLASE in the last 168 hours. No results for input(s): AMMONIA in the last 168 hours.  CBC: Recent Labs  Lab 03/07/20 0536 03/08/20 0241  WBC 11.7* 17.5*  NEUTROABS 8.9* 14.2*  HGB 11.3* 11.8*  HCT 36.9* 38.0*  MCV 95.3 94.1  PLT 190 182    Cardiac Enzymes: No results for input(s): CKTOTAL, TROPONINI in the last 168 hours.  BNP: Invalid input(s): POCBNP  CBG: Recent Labs  Lab 03/07/20 2347 03/08/20 0028 03/08/20 0346 03/08/20 0732 03/08/20 1123  GLUCAP 68* 86 115* 162* 222*    Microbiology: Recent Results (from the past 720 hour(s))  SARS Coronavirus 2 by RT PCR (hospital order, performed in [facility] hospital lab) Nasopharyngeal Nasopharyngeal Swab     Status: None   Collection Time: 03/03/20 11:02 AM   Specimen: Nasopharyngeal Swab  Result Value Ref Range Status   SARS Coronavirus 2 NEGATIVE NEGATIVE Final    Comment: (NOTE) SARS-CoV-2 target nucleic acids are NOT DETECTED.  The SARS-CoV-2 RNA is generally detectable in upper and lower respiratory specimens during the acute phase of infection. The lowest concentration of SARS-CoV-2 viral copies this assay can detect is 250 copies / mL. A negative result does not preclude SARS-CoV-2 infection and should not be used as the sole basis for treatment or other patient management decisions.  A negative result may occur with improper specimen collection / handling, submission of specimen other than nasopharyngeal swab, presence of viral mutation(s) within the areas targeted by this assay, and inadequate number of viral copies (<250 copies / mL). A negative result must be combined with clinical observations, patient history, and epidemiological information.  Fact Sheet for Patients:   StrictlyIdeas.no  Fact Sheet for Healthcare Providers: BankingDealers.co.za  This  test is not yet approved or  cleared by the [place] FDA and has been authorized for detection and/or diagnosis of SARS-CoV-2 by FDA under an Emergency Use Authorization (EUA).  This EUA will remain in effect (meaning this test can be used) for the duration of the COVID-19 declaration under Section 564(b)(1) of the Act, 21 U.S.C. section 360bbb-3(b)(1), unless the authorization is terminated or revoked sooner.  Performed at [facility], [address] [place], [place] [address]   SARS Coronavirus 2 by RT PCR (hospital order, performed in [facility] hospital lab) Nasopharyngeal Nasopharyngeal Swab     Status: None   Collection Time: 03/07/20  5:36 AM   Specimen: Nasopharyngeal Swab  Result Value Ref Range Status   SARS Coronavirus 2 NEGATIVE NEGATIVE Final    Comment: (NOTE) SARS-CoV-2 target nucleic acids are NOT DETECTED.  The SARS-CoV-2 RNA is generally detectable in upper and lower respiratory specimens during the acute phase of infection. The lowest concentration of SARS-CoV-2 viral copies this assay can detect is 250 copies / mL. A negative result does not preclude SARS-CoV-2 infection and should not be used as the sole basis for treatment or other patient management decisions.  A negative result may occur  with improper specimen collection / handling, submission of specimen other than nasopharyngeal swab, presence of viral mutation(s) within the areas targeted by this assay, and inadequate number of viral copies (<250 copies / mL). A negative result must be combined with clinical observations, patient history, and epidemiological information.  Fact Sheet for Patients:   StrictlyIdeas.no  Fact Sheet for Healthcare Providers: BankingDealers.co.za  This test is not yet approved or  cleared by the [place] FDA and has been authorized for detection and/or diagnosis of SARS-CoV-2 by FDA under an Emergency Use  Authorization (EUA).  This EUA will remain in effect (meaning this test can be used) for the duration of the COVID-19 declaration under Section 564(b)(1) of the Act, 21 U.S.C. section 360bbb-3(b)(1), unless the authorization is terminated or revoked sooner.  Performed at [facility], [address] [place], [place] [address]   CULTURE, BLOOD (ROUTINE X 2) w Reflex to ID Panel     Status: None (Preliminary result)   Collection Time: 03/07/20  8:05 AM   Specimen: BLOOD  Result Value Ref Range Status   Specimen Description BLOOD LEFT FOREARM  Final   Special Requests   Final    BOTTLES DRAWN AEROBIC AND ANAEROBIC Blood Culture adequate volume   Culture   Final    NO GROWTH < 24 HOURS Performed at [facility], [address], [place], [place] [address]    Report Status PENDING  Incomplete  CULTURE, BLOOD (ROUTINE X 2) w Reflex to ID Panel     Status: None (Preliminary result)   Collection Time: 03/07/20  8:05 AM   Specimen: BLOOD  Result Value Ref Range Status   Specimen Description BLOOD LEFT POSTERIOR SRM  Final   Special Requests   Final    BOTTLES DRAWN AEROBIC AND ANAEROBIC Blood Culture adequate volume   Culture   Final    NO GROWTH < 24 HOURS Performed at [facility], [address], [place], [place] [address]    Report Status PENDING  Incomplete  MRSA PCR Screening     Status: None   Collection Time: 03/07/20 12:17 PM   Specimen: Nasal Mucosa; Nasopharyngeal  Result Value Ref Range Status   MRSA by PCR NEGATIVE NEGATIVE Final    Comment:        The GeneXpert MRSA Assay (FDA approved for NASAL specimens only), is one component of a comprehensive MRSA colonization surveillance program. It is not intended to diagnose MRSA infection nor to guide or monitor treatment for MRSA infections. Performed at [facility], [address] [place], [place] [address]      Coagulation Studies: Recent Labs    03/07/20 0536  LABPROT  17.6*  INR 1.5*    Urinalysis: No results for input(s): COLORURINE, LABSPEC, PHURINE, GLUCOSEU, HGBUR, BILIRUBINUR, KETONESUR, PROTEINUR, UROBILINOGEN, NITRITE, LEUKOCYTESUR in the last 72 hours.  Invalid input(s): APPERANCEUR   Scheduled Meds: . albuterol  5 mg Nebulization Once  . aspirin EC  325 mg Oral Daily  . atorvastatin  40 mg Oral QPM  . carvedilol  25 mg Oral BID WC  . Chlorhexidine Gluconate Cloth  6 each Topical Q0600  . cinacalcet  30 mg Oral Daily  . escitalopram  20 mg Oral Daily  . fluticasone  2 spray Each Nare QPM  . [START ON 03/09/2020] glipiZIDE  5 mg Oral QAC breakfast  . insulin aspart  0-5 Units Subcutaneous QHS  . insulin aspart  0-6 Units Subcutaneous TID WC  . insulin  detemir  5 Units Subcutaneous BID  . mouth rinse  15 mL Mouth Rinse BID  . montelukast  10 mg Oral Daily  . [START ON 03/09/2020] predniSONE  10 mg Oral Q breakfast  . sevelamer carbonate  3,200 mg Oral TID WC   And  . sevelamer carbonate  800 mg Oral With snacks   Continuous Infusions: . amiodarone 60 mg/hr (03/08/20 1200)  . heparin 1,300 Units/hr (03/08/20 1200)   PRN Meds:.dextrose, docusate sodium, hydrALAZINE, metoprolol tartrate, polyethylene glycol    Imaging: DG Chest Portable 1 View  Result Date: 03/07/2020 CLINICAL DATA:  Acute shortness of breath.  Arrhythmia. EXAM: PORTABLE CHEST 1 VIEW COMPARISON:  03/03/2020.  07/26/2019. FINDINGS: Mediastinum and hilar structures normal. Cardiomegaly again noted. Normal pulmonary vascularity. Bilateral interstitial prominence again noted. These changes may be chronic. An active interstitial process including pneumonitis and or interstitial edema cannot be excluded. Low lung volumes. No pleural effusion or pneumothorax. IMPRESSION: 1.  Cardiomegaly again noted.  No pulmonary venous congestion. 2. Bilateral interstitial prominence again noted. These changes may be chronic. Active interstitial process including pneumonitis and or interstitial  edema cannot be excluded. 3.  Low lung volumes. Electronically Signed   By: Marcello Moores  Register   On: 03/07/2020 06:05     Assessment & Plan: Pt is a 54 y.o.   male with , was admitted on 03/07/2020 with Hyperglycemia [R73.9] Acute respiratory failure with hypoxia (HCC) [J96.01] Elevated troponin I level [R77.8] Non-sustained ventricular tachycardia (HCC) [I47.2] Atrial fibrillation with RVR (HCC) [I48.91] ESRD on hemodialysis ([place]) [N18.6, Y07.3] Severe systolic congestive heart failure ([place]) [I50.20]   [facility]/MWF/[facility]/right arm AV fistula   #End-stage renal disease  Dialysis today at bedside  Will plan on dialyzing MWF schedule  #Anemia of chronic kidney disease  Lab Results  Component Value Date   HGB 11.8 (L) 03/08/2020  Low-dose Epogen for hemoglobin less than 11  #Secondary hyperparathyroidism Lab Results  Component Value Date   PTH 246 (H) 07/28/2019   CALCIUM 8.7 (L) 03/08/2020   PHOS 4.2 03/08/2020    Monitor calcium and phosphorus during admission,  Continue Sensipar and Renvela at home doses with meals  #Cardiac arrhythmia, AFib with RVR Currently on IV amiodarone, IV heparin 2D echo was done yesterday, results pending Cardiology evaluation ongoing  #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 Any transcription errors are unintentional

## 2020-03-08 NOTE — Progress Notes (Signed)
[facility] Cardiology    SUBJECTIVE: Patient doing relatively well in the unit feels fine and is scheduled for dialysis today appears stable enough to transfer to the floor heart rate still atrial fibrillation but controlled   Vitals:   03/08/20 1300 03/08/20 1400 03/08/20 1500 03/08/20 1600  BP: 104/80 117/87 (!) 121/101 128/86  Pulse: 99 (!) 122 (!) 111   Resp: (!) 24 (!) 0 (!) 0 14  Temp:    (!) 97 F (36.1 C)  TempSrc:    Axillary  SpO2: 100% 100% 100% 100%  Weight:      Height:         Intake/Output Summary (Last 24 hours) at 03/08/2020 1710 Last data filed at 03/08/2020 1600 Gross per 24 hour  Intake 1670.47 ml  Output --  Net 1670.47 ml      PHYSICAL EXAM  General: Well developed, well nourished, in no acute distress HEENT:  Normocephalic and atramatic Neck:  No JVD.  Lungs: Clear bilaterally to auscultation and percussion. Heart: Irregular irregular. Normal S1 and S2 without gallops or murmurs.  Abdomen: Bowel sounds are positive, abdomen soft and non-tender  Msk:  Back normal, normal gait. Normal strength and tone for age. Extremities: No clubbing, cyanosis or edema.   Neuro: Alert and oriented X 3. Psych:  Good affect, responds appropriately   LABS: Basic Metabolic Panel: Recent Labs    03/07/20 0536 03/07/20 0805 03/07/20 1217 03/07/20 2235 03/08/20 0241  NA 125*  --    < > 132* 129*  K 4.5  --    < > 4.3 4.4  CL 83*  --    < > 88* 89*  CO2 26  --    < > 25 22  GLUCOSE 792*  --    < > 87 122*  BUN 41*  --    < > 49* 51*  CREATININE 5.26*  --    < > 6.20* 5.98*  CALCIUM 8.2*  --    < > 8.9 8.7*  MG 2.4  --   --   --  2.6*  PHOS  --  3.3  --   --  4.2   < > = values in this interval not displayed.   Liver Function Tests: Recent Labs    03/07/20 0536 03/08/20 0241  AST 20  --   ALT 37  --   ALKPHOS 169*  --   BILITOT 1.1  --   PROT 6.1*  --   ALBUMIN 3.2* 3.0*   No results for input(s): LIPASE, AMYLASE in the last 72 hours. CBC: Recent  Labs    03/07/20 0536 03/08/20 0241  WBC 11.7* 17.5*  NEUTROABS 8.9* 14.2*  HGB 11.3* 11.8*  HCT 36.9* 38.0*  MCV 95.3 94.1  PLT 190 182   Cardiac Enzymes: No results for input(s): CKTOTAL, CKMB, CKMBINDEX, TROPONINI in the last 72 hours. BNP: Invalid input(s): POCBNP D-Dimer: No results for input(s): DDIMER in the last 72 hours. Hemoglobin A1C: No results for input(s): HGBA1C in the last 72 hours. Fasting Lipid Panel: No results for input(s): CHOL, HDL, LDLCALC, TRIG, CHOLHDL, LDLDIRECT in the last 72 hours. Thyroid Function Tests: Recent Labs    03/07/20 0536  TSH 5.305*   Anemia Panel: No results for input(s): VITAMINB12, FOLATE, FERRITIN, TIBC, IRON, RETICCTPCT in the last 72 hours.  DG Chest Portable 1 View  Result Date: 03/07/2020 CLINICAL DATA:  Acute shortness of breath.  Arrhythmia. EXAM: PORTABLE CHEST 1 VIEW COMPARISON:  03/03/2020.  07/26/2019. FINDINGS: Mediastinum and hilar structures normal. Cardiomegaly again noted. Normal pulmonary vascularity. Bilateral interstitial prominence again noted. These changes may be chronic. An active interstitial process including pneumonitis and or interstitial edema cannot be excluded. Low lung volumes. No pleural effusion or pneumothorax. IMPRESSION: 1.  Cardiomegaly again noted.  No pulmonary venous congestion. 2. Bilateral interstitial prominence again noted. These changes may be chronic. Active interstitial process including pneumonitis and or interstitial edema cannot be excluded. 3.  Low lung volumes. Electronically Signed   By: Marcello Moores  Register   On: 03/07/2020 06:05     Echo severely depressed left ventricular function globally ejection fraction of less than 25%  TELEMETRY: Atrial fibrillation heart rate of about 110 wide-complex  ASSESSMENT AND PLAN:  Principal Problem:   Acute respiratory failure with hypoxia ([place]) Active Problems:   CAD (coronary artery disease)   Diabetes mellitus ([place])   Dialysis patient  ([place])   ESRD (end stage renal disease) ([place])   Hyperlipidemia, unspecified   Hypertension   Stented coronary artery   Elevated troponin   Acute congestive heart failure ([place])   Pressure injury of skin   Atrial fibrillation with RVR ([place])    Plan Patient states to be doing reasonably well resting comfortably in the unit no palpitations tachycardia Improved shortness of breath Patient denies any chest pain or leg edema Pending dialysis today Diabetes reasonably controlled continue current management Atrial fibrillation rate controlled patient still on IV amiodarone heparin Blood pressure reasonably controlled today continue current management Consider transfer to telemetry since patient is markedly more stable   Yolonda Kida, MD 03/08/2020 5:10 PM

## 2020-03-08 NOTE — Consult Note (Signed)
[place] for Electrolyte Monitoring and Replacement   Recent Labs: Potassium (mmol/L)  Date Value  03/08/2020 4.4   Magnesium (mg/dL)  Date Value  03/08/2020 2.6 (H)   Calcium (mg/dL)  Date Value  03/08/2020 8.7 (L)   Albumin (g/dL)  Date Value  03/08/2020 3.0 (L)  03/19/2016 4.1   Phosphorus (mg/dL)  Date Value  03/08/2020 4.2   Sodium (mmol/L)  Date Value  03/08/2020 129 (L)  03/19/2016 139     Assessment: Patient is a 54 y/o M with medical history including diabetes, ESRD on HD, CHF who presented to the ED 8/17 with shortness of breath and dizziness. Subsequently found to be in Afib with RVR and started on amiodarone. Labs on admission notable for glucose of 792, BNP 3819, troponin 978. Patient was started on an insulin infusion. Pharmacy has been consulted to assist with electrolyte replacement.   Goal of Therapy:  K 4, Mg 2, all other electrolytes within normal limits  Plan:  Defer electrolyte replacement and frequency of monitoring to Nephrology.   Tawnya Crook, PharmD Clinical Pharmacist 03/08/2020 2:40 PM

## 2020-03-08 NOTE — Progress Notes (Addendum)
PROGRESS NOTE    Patient: Ralph Peterson                            PCP: Vidal Schwalbe, MD                    DOB: [birth year]            DOA: 03/07/2020 TFT:[id number]             DOS: 03/08/2020, 12:25 PM   LOS: 1 day   Date of Service: The patient was seen and examined on 03/08/2020  Subjective:   The patient was seen and examined this Am. Hemodynamically stable in ICU, heart rate still ranges 116-157, on amiodarone drip and heparin drip. Patient is due for hemodialysis today.  Brief Narrative:   Patient is a 54 y/o M with medical history including diabetes, ESRD (MWF) on HD, CHF (EJF 25-30%) who presented to the ED 8/17 with shortness of breath and dizziness. Subsequently found to be in new onset Afib with RVR and started on amiodarone, and heparin drip. Also was found hyperglycemic with blood glucose level 792, started on insulin drip. BNP elevated as high as 3819, troponin of 978 Cardiology was consulted Nephrology was consulted Patient was initially admitted by PCCM to ICU -with insulin, amiodarone, heparin drip  +COVID-19 about 7 months ago and was in the hospital and also was told he had a heart attack at the time.  Assessment & Plan:   Principal Problem:   Acute respiratory failure with hypoxia (HCC) Active Problems:   Atrial fibrillation with RVR (HCC)   CAD (coronary artery disease)   Diabetes mellitus ([place])   Dialysis patient ([place])   ESRD (end stage renal disease) ([place])   Hyperlipidemia, unspecified   Hypertension   Stented coronary artery   Elevated troponin   Acute congestive heart failure (HCC)   Pressure injury of skin  New onset A. fib with RVR -Cardiology consulted following, management per cardiology -Currently on amiodarone drip for rate control  -Remains on heparin drip -Patient history of vasculopathy, the stents on Plavix Anticipating patient to be switched to aspirin and chronic anticoagulation by cardiology  Elevated troponin -likely  ischemic demand due to A. fib with RVR, CHF exacerbation -Trend troponin -Cardiology following -Continue current medication including beta-blocker, amiodarone, heparin   Acute on chronic systolic congestive heart failure with ejection fraction of 25-30% -Continue to monitor I's and O's, on hemodialysis -Monitoring daily weight -Hemodialysis today  Hyperosmolar nonketotic state /with history of diabetes mellitus type 2 -uncontrolled -Status post insulin drip -Reinitiating home insulin regimen titrating (home medication glipizide -Checking CBG QA CHS with SSI coverage  End-stage renal disease Baseline hemodialysis days Mondays Wednesdays and Fridays -Nephrology consulted -Anticipating hemodialysis today  Electrode abnormalities, potassium magnesium - will be monitored closely -Repleting accordingly  History of peripheral vascular disease with stents to lower extremities -On Plavix at home,  -May have to be switched to aspirin none chronic anticoagulation per cardiology   Hyperlipidemia Continue statin   Depression -Stable -Home medication of Lexapro   Secondary hyperparathyroidism -Continue repleting calcium phosphate, Sensipar, Renvela    Antimicrobials:  Consultants:  Cardiology Nephrology PCCM   ------------------------------------------------------------------------------------------------------------------------------------------------  DVT prophylaxis:  HC:[id number] drip Code Status:   Code Status: Full Code Family Communication: No family member present at bedside- The above findings and plan of care has been discussed with patient  in detail,  they expressed understanding and agreement  of above. -Advance care planning has been discussed.   Admission status:    Status is: Inpatient  Remains inpatient appropriate because:Inpatient level of care appropriate due to severity of illness   Dispo: The patient is from: Home              Anticipated d/c  is to: Home              Anticipated d/c date is: 2 days              Patient currently is not medically stable to d/c.        Procedures:   No admission procedures for hospital encounter.    Antimicrobials:  Anti-infectives (From admission, onward)   None       Medication:  . albuterol  5 mg Nebulization Once  . aspirin EC  325 mg Oral Daily  . Chlorhexidine Gluconate Cloth  6 each Topical Q0600  . insulin aspart  0-5 Units Subcutaneous QHS  . insulin aspart  0-6 Units Subcutaneous TID WC  . insulin detemir  5 Units Subcutaneous BID  . mouth rinse  15 mL Mouth Rinse BID    dextrose, docusate sodium, hydrALAZINE, metoprolol tartrate, polyethylene glycol   Objective:   Vitals:   03/08/20 0800 03/08/20 0900 03/08/20 1100 03/08/20 1200  BP: 119/89 (!) 111/93 (!) 83/62 (!) 126/110  Pulse: (!) 116 (!) 117 (!) 118 (!) 127  Resp: (!) 21 (!) 25 19 18   Temp: 97.9 F (36.6 C)     TempSrc: Oral     SpO2: 100% 100% 100% 99%  Weight:      Height:        Intake/Output Summary (Last 24 hours) at 03/08/2020 1225 Last data filed at 03/08/2020 1200 Gross per 24 hour  Intake 2333.09 ml  Output --  Net 2333.09 ml   Filed Weights   03/07/20 0527 03/07/20 1141 03/08/20 0419  Weight: 111.1 kg 111 kg 115.7 kg     Examination:   Physical Exam  Constitution:  Alert, cooperative, no distress,  Appears calm and comfortable  Psychiatric: Normal and stable mood and affect, cognition intact,   HEENT: Normocephalic, PERRL, otherwise with in Normal limits  Chest:Chest symmetric Cardio vascular:  S1/S2, RRR, No murmure, No Rubs or Gallops  pulmonary: Clear to auscultation bilaterally, respirations unlabored, negative wheezes / crackles Abdomen: Soft, non-tender, non-distended, bowel sounds,no masses, no organomegaly Muscular skeletal: Limited exam - in bed, able to move all 4 extremities, Normal strength,  Neuro: CNII-XII intact. , normal motor and sensation, reflexes intact   Extremities: No pitting edema lower extremities, +2 pulses  Skin: Dry, warm to touch, negative for any Rashes, No open wounds Wounds: per nursing documentation Pressure Injury 03/07/20 Coccyx Mid Stage 1 -  Intact skin with non-blanchable redness of a localized area usually over a bony prominence. (Active)  03/07/20 1229  Location: Coccyx  Location Orientation: Mid  Staging: Stage 1 -  Intact skin with non-blanchable redness of a localized area usually over a bony prominence.  Wound Description (Comments):   Present on Admission:     ------------------------------------------------------------------------------------------------------------------------------------------    LABs:  CBC Latest Ref Rng & Units 03/08/2020 03/07/2020 03/03/2020  WBC 4.0 - 10.5 K/uL 17.5(H) 11.7(H) 9.7  Hemoglobin 13.0 - 17.0 g/dL 11.8(L) 11.3(L) 10.9(L)  Hematocrit 39 - 52 % 38.0(L) 36.9(L) 33.0(L)  Platelets 150 - 400 K/uL 182 190 152   CMP Latest Ref Rng & Units 03/08/2020 03/07/2020 03/07/2020  Glucose 70 -  99 mg/dL 122(H) 87 160(H)  BUN 6 - 20 mg/dL 51(H) 49(H) 47(H)  Creatinine 0.61 - 1.24 mg/dL 5.98(H) 6.20(H) 5.63(H)  Sodium 135 - 145 mmol/L 129(L) 132(L) 133(L)  Potassium 3.5 - 5.1 mmol/L 4.4 4.3 4.3  Chloride 98 - 111 mmol/L 89(L) 88(L) 89(L)  CO2 22 - 32 mmol/L 22 25 27   Calcium 8.9 - 10.3 mg/dL 8.7(L) 8.9 8.5(L)  Total Protein 6.5 - 8.1 g/dL - - -  Total Bilirubin 0.3 - 1.2 mg/dL - - -  Alkaline Phos 38 - 126 U/L - - -  AST 15 - 41 U/L - - -  ALT 0 - 44 U/L - - -       Micro Results Recent Results (from the past 240 hour(s))  SARS Coronavirus 2 by RT PCR (hospital order, performed in [place] hospital lab) Nasopharyngeal Nasopharyngeal Swab     Status: None   Collection Time: 03/03/20 11:02 AM   Specimen: Nasopharyngeal Swab  Result Value Ref Range Status   SARS Coronavirus 2 NEGATIVE NEGATIVE Final    Comment: (NOTE) SARS-CoV-2 target nucleic acids are NOT DETECTED.  The  SARS-CoV-2 RNA is generally detectable in upper and lower respiratory specimens during the acute phase of infection. The lowest concentration of SARS-CoV-2 viral copies this assay can detect is 250 copies / mL. A negative result does not preclude SARS-CoV-2 infection and should not be used as the sole basis for treatment or other patient management decisions.  A negative result may occur with improper specimen collection / handling, submission of specimen other than nasopharyngeal swab, presence of viral mutation(s) within the areas targeted by this assay, and inadequate number of viral copies (<250 copies / mL). A negative result must be combined with clinical observations, patient history, and epidemiological information.  Fact Sheet for Patients:   StrictlyIdeas.no  Fact Sheet for Healthcare Providers: BankingDealers.co.za  This test is not yet approved or  cleared by the [place] FDA and has been authorized for detection and/or diagnosis of SARS-CoV-2 by FDA under an Emergency Use Authorization (EUA).  This EUA will remain in effect (meaning this test can be used) for the duration of the COVID-19 declaration under Section 564(b)(1) of the Act, 21 U.S.C. section 360bbb-3(b)(1), unless the authorization is terminated or revoked sooner.  Performed at [facility], [address] [place], [place] [address]   SARS Coronavirus 2 by RT PCR (hospital order, performed in [facility] hospital lab) Nasopharyngeal Nasopharyngeal Swab     Status: None   Collection Time: 03/07/20  5:36 AM   Specimen: Nasopharyngeal Swab  Result Value Ref Range Status   SARS Coronavirus 2 NEGATIVE NEGATIVE Final    Comment: (NOTE) SARS-CoV-2 target nucleic acids are NOT DETECTED.  The SARS-CoV-2 RNA is generally detectable in upper and lower respiratory specimens during the acute phase of infection. The lowest concentration of SARS-CoV-2 viral  copies this assay can detect is 250 copies / mL. A negative result does not preclude SARS-CoV-2 infection and should not be used as the sole basis for treatment or other patient management decisions.  A negative result may occur with improper specimen collection / handling, submission of specimen other than nasopharyngeal swab, presence of viral mutation(s) within the areas targeted by this assay, and inadequate number of viral copies (<250 copies / mL). A negative result must be combined with clinical observations, patient history, and epidemiological information.  Fact Sheet for Patients:   StrictlyIdeas.no  Fact Sheet for Healthcare Providers: BankingDealers.co.za  This test is not yet approved or  cleared by the [place] and has been authorized for detection and/or diagnosis of SARS-CoV-2 by FDA under an Emergency Use Authorization (EUA).  This EUA will remain in effect (meaning this test can be used) for the duration of the COVID-19 declaration under Section 564(b)(1) of the Act, 21 U.S.C. section 360bbb-3(b)(1), unless the authorization is terminated or revoked sooner.  Performed at [facility], [address] [place], [place] [address]   CULTURE, BLOOD (ROUTINE X 2) w Reflex to ID Panel     Status: None (Preliminary result)   Collection Time: 03/07/20  8:05 AM   Specimen: BLOOD  Result Value Ref Range Status   Specimen Description BLOOD LEFT FOREARM  Final   Special Requests   Final    BOTTLES DRAWN AEROBIC AND ANAEROBIC Blood Culture adequate volume   Culture   Final    NO GROWTH < 24 HOURS Performed at [facility], [address], [place], [place] [address]    Report Status PENDING  Incomplete  CULTURE, BLOOD (ROUTINE X 2) w Reflex to ID Panel     Status: None (Preliminary result)   Collection Time: 03/07/20  8:05 AM   Specimen: BLOOD  Result Value Ref Range Status   Specimen Description  BLOOD LEFT POSTERIOR SRM  Final   Special Requests   Final    BOTTLES DRAWN AEROBIC AND ANAEROBIC Blood Culture adequate volume   Culture   Final    NO GROWTH < 24 HOURS Performed at [facility], [address], [place],  [address]    Report Status PENDING  Incomplete  MRSA PCR Screening     Status: None   Collection Time: 03/07/20 12:17 PM   Specimen: Nasal Mucosa; Nasopharyngeal  Result Value Ref Range Status   MRSA by PCR NEGATIVE NEGATIVE Final    Comment:        The GeneXpert MRSA Assay (FDA approved for NASAL specimens only), is one component of a comprehensive MRSA colonization surveillance program. It is not intended to diagnose MRSA infection nor to guide or monitor treatment for MRSA infections. Performed at [facility], [address], [place],  [address]     Radiology Reports DG Chest 2 View  Result Date: 03/03/2020 CLINICAL DATA:  Patient complains of sob and cough. X 1 week. Patient states that he saw his PCP last week and was diagnosed with bronchitis. Patient states that he is not getting any better on meds. EXAM: CHEST - 2 VIEW COMPARISON:  Chest radiograph 07/26/2019 FINDINGS: Stable cardiomediastinal contours with mildly enlarged heart size. Low lung volumes. Mild right basilar opacities likely reflecting atelectasis. Diffuse bilateral mild interstitial thickening. No focal consolidation. No pneumothorax or pleural effusion. No acute finding in the visualized skeleton. IMPRESSION: Mild right basilar opacities likely atelectasis. Diffuse bilateral mild interstitial thickening could reflect bronchitic changes or trace edema. Electronically Signed   By: Audie Pinto M.D.   On: 03/03/2020 08:53   DG Chest Portable 1 View  Result Date: 03/07/2020 CLINICAL DATA:  Acute shortness of breath.  Arrhythmia. EXAM: PORTABLE CHEST 1 VIEW COMPARISON:  03/03/2020.  07/26/2019. FINDINGS: Mediastinum and hilar structures normal. Cardiomegaly  again noted. Normal pulmonary vascularity. Bilateral interstitial prominence again noted. These changes may be chronic. An active interstitial process including pneumonitis and or interstitial edema cannot be excluded. Low lung volumes. No pleural effusion or pneumothorax. IMPRESSION: 1.  Cardiomegaly again noted.  No pulmonary venous congestion. 2.  Bilateral interstitial prominence again noted. These changes may be chronic. Active interstitial process including pneumonitis and or interstitial edema cannot be excluded. 3.  Low lung volumes. Electronically Signed   By: Marcello Moores  Register   On: 03/07/2020 06:05    SIGNED: Deatra James, MD, FACP, FHM. [facility],  Pager (please use [email or web address] to [email or web address])  If 7PM-7AM, please contact night-coverage [email or web address] O[facility] 03/08/2020, 12:25 PM

## 2020-03-08 NOTE — Progress Notes (Signed)
ANTICOAGULATION CONSULT NOTE - Initial Consult  Pharmacy Consult for Heparin infusion Indication: chest pain/ACS  Allergies  Allergen Reactions   Penicillins Rash    Has patient had a PCN reaction causing immediate rash, facial/tongue/throat swelling, SOB or lightheadedness with hypotension: Yes Has patient had a PCN reaction causing severe rash involving mucus membranes or skin necrosis: Yes Has patient had a PCN reaction that required hospitalization: No Has patient had a PCN reaction occurring within the last 10 years: No If all of the above answers are "NO", then may proceed with Cephalosporin use.     Patient Measurements: Height: 6' (182.9 cm) Weight: 111 kg (244 lb 11.4 oz) IBW/kg (Calculated) : 77.6 Heparin Dosing Weight: 101.2 kg  Vital Signs: Temp: 97.6 F (36.4 C) (08/18 0000) Temp Source: Oral (08/18 0000) BP: 106/90 (08/18 0200) Pulse Rate: 58 (08/18 0200)  Labs: Recent Labs    03/07/20 0536 03/07/20 0536 03/07/20 0805 03/07/20 1217 03/07/20 1217 03/07/20 1910 03/07/20 2235 03/08/20 0241  HGB 11.3*  --   --   --   --   --   --  11.8*  HCT 36.9*  --   --   --   --   --   --  38.0*  PLT 190  --   --   --   --   --   --  182  APTT  --   --   --  84*  --   --   --   --   LABPROT 17.6*  --   --   --   --   --   --   --   INR 1.5*  --   --   --   --   --   --   --   HEPARINUNFRC  --   --   --   --   --  0.61  --  0.42  CREATININE 5.26*   < >  --  5.43*   < > 5.63* 6.20* 5.98*  TROPONINIHS 978*  --  2,030*  --   --   --   --   --    < > = values in this interval not displayed.    Estimated Creatinine Clearance: 18.6 mL/min (A) (by C-G formula based on SCr of 5.98 mg/dL (H)).   Medical History: Past Medical History:  Diagnosis Date   CAD (coronary artery disease)    a. 09/2010 Cath/PCI ([facility]): LM nl, LAD 80m, D1 80 (small), LCX 57m, OM1 30, RI 70 (small), RCA 70 (DES).   Coronary artery disease    COVID-19 virus infection 07/2019   Diabetes  mellitus without complication (HCC)    ESRD (end stage renal disease) ([place])    Hyperlipidemia    Hypertension    Ischemic cardiomyopathy    a.  12/2011 Echo ([facility]): NL EF, mod LVH. Mild AS/MS, triv PR/TR. 07/2019 Echo: EF 25-30%, GR1 DD, inf/post HK, low nl RV fxn, mildly dil RA, triv MR, mild Ao sclerosis w/o stenosis.   NSTEMI (non-ST elevated myocardial infarction) ([place]) 07/2019   PAD (peripheral artery disease) ([place])    a. 03/2016 s/p PTA/DEB R SFA/popliteal/peroneal; b. 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 08/2017 Atherectomy/DEB dist L SFA/popliteal. PTA of L AT; d. 06/2018 PTA/DEB L SFA/popliteal/PT/AT; e. 01/2019 Stable ABIs.   Sleep apnea     Assessment: Pharmacy has been consulted for heparin dosing and monitoring for a 54yo male who presents with possible NSTEMI. PMH  significant for ESRD on HD (MWF), CAD, DM, and systolic CHF (EF 94-49%). Upon admission patient was in Afib with RVR. Per chart review, patient does not have anticoagulation prior to admission.   aPTT pending Hgb 11.3; Plt 190 INR slightly elevated on admission at 1.5 possible due to fluid overload from incomplete dialysis session / hepatic congestion / hemodynamic compromise  0817 1910 HL 0.61 0818 0241 HL 0.42, therapeutic x 2, CBC stable  Goal of Therapy:  Heparin level 0.3-0.7 units/ml Monitor platelets by anticoagulation protocol: Yes   Plan:  Continue heparin infusion at 1300 units/hr Check anti-Xa level daily and CBC while on heparin Continue to monitor H&H and platelets  Ena Dawley, PharmD Clinical Pharmacist 03/08/2020 3:45 AM

## 2020-03-09 ENCOUNTER — Ambulatory Visit: Payer: Medicare PPO | Admitting: Podiatry

## 2020-03-09 LAB — CBC WITH DIFFERENTIAL/PLATELET
Abs Immature Granulocytes: 0.14 10*3/uL — ABNORMAL HIGH (ref 0.00–0.07)
Basophils Absolute: 0 10*3/uL (ref 0.0–0.1)
Basophils Relative: 0 %
Eosinophils Absolute: 0.1 10*3/uL (ref 0.0–0.5)
Eosinophils Relative: 1 %
HCT: 34.6 % — ABNORMAL LOW (ref 39.0–52.0)
Hemoglobin: 10.7 g/dL — ABNORMAL LOW (ref 13.0–17.0)
Immature Granulocytes: 1 %
Lymphocytes Relative: 10 %
Lymphs Abs: 1.3 10*3/uL (ref 0.7–4.0)
MCH: 28.9 pg (ref 26.0–34.0)
MCHC: 30.9 g/dL (ref 30.0–36.0)
MCV: 93.5 fL (ref 80.0–100.0)
Monocytes Absolute: 0.8 10*3/uL (ref 0.1–1.0)
Monocytes Relative: 6 %
Neutro Abs: 11.2 10*3/uL — ABNORMAL HIGH (ref 1.7–7.7)
Neutrophils Relative %: 82 %
Platelets: 178 10*3/uL (ref 150–400)
RBC: 3.7 MIL/uL — ABNORMAL LOW (ref 4.22–5.81)
RDW: 16.3 % — ABNORMAL HIGH (ref 11.5–15.5)
WBC: 13.5 10*3/uL — ABNORMAL HIGH (ref 4.0–10.5)
nRBC: 0 % (ref 0.0–0.2)

## 2020-03-09 LAB — PROCALCITONIN: Procalcitonin: 2.21 ng/mL

## 2020-03-09 LAB — RENAL FUNCTION PANEL
Albumin: 2.7 g/dL — ABNORMAL LOW (ref 3.5–5.0)
Anion gap: 15 (ref 5–15)
BUN: 47 mg/dL — ABNORMAL HIGH (ref 6–20)
CO2: 27 mmol/L (ref 22–32)
Calcium: 8 mg/dL — ABNORMAL LOW (ref 8.9–10.3)
Chloride: 91 mmol/L — ABNORMAL LOW (ref 98–111)
Creatinine, Ser: 5.52 mg/dL — ABNORMAL HIGH (ref 0.61–1.24)
GFR calc Af Amer: 13 mL/min — ABNORMAL LOW (ref >60)
GFR calc non Af Amer: 11 mL/min — ABNORMAL LOW (ref >60)
Glucose, Bld: 273 mg/dL — ABNORMAL HIGH (ref 70–99)
Phosphorus: 3.7 mg/dL (ref 2.5–4.6)
Potassium: 4.2 mmol/L (ref 3.5–5.1)
Sodium: 133 mmol/L — ABNORMAL LOW (ref 135–145)

## 2020-03-09 LAB — GLUCOSE, CAPILLARY
Glucose-Capillary: 250 mg/dL — ABNORMAL HIGH (ref 70–99)
Glucose-Capillary: 209 mg/dL — ABNORMAL HIGH (ref 70–99)
Glucose-Capillary: 125 mg/dL — ABNORMAL HIGH (ref 70–99)
Glucose-Capillary: 96 mg/dL (ref 70–99)

## 2020-03-09 LAB — MAGNESIUM: Magnesium: 2.2 mg/dL (ref 1.7–2.4)

## 2020-03-09 LAB — HEPARIN LEVEL (UNFRACTIONATED): Heparin Unfractionated: 0.78 IU/mL — ABNORMAL HIGH (ref 0.30–0.70)

## 2020-03-09 MED ORDER — LOPERAMIDE HCL 2 MG PO CAPS
2.0000 mg | ORAL_CAPSULE | ORAL | Status: DC | PRN
  Administered 2020-03-09: 2 mg via ORAL
  Filled 2020-03-09: qty 1

## 2020-03-09 MED ORDER — CARVEDILOL 12.5 MG PO TABS
12.5000 mg | ORAL_TABLET | Freq: Two times a day (BID) | ORAL | Status: DC
  Administered 2020-03-09 (×2): 12.5 mg via ORAL
  Filled 2020-03-09 (×3): qty 1

## 2020-03-09 MED ORDER — PREDNISONE 10 MG PO TABS
5.0000 mg | ORAL_TABLET | Freq: Every day | ORAL | Status: DC
  Administered 2020-03-10: 5 mg via ORAL
  Filled 2020-03-09: qty 1

## 2020-03-09 MED ORDER — APIXABAN 2.5 MG PO TABS: 2.5000 mg | ORAL_TABLET | Freq: Two times a day (BID) | ORAL | Status: DC

## 2020-03-09 MED ORDER — APIXABAN 5 MG PO TABS
5.0000 mg | ORAL_TABLET | Freq: Two times a day (BID) | ORAL | Status: DC
  Administered 2020-03-09 (×2): 5 mg via ORAL
  Filled 2020-03-09 (×3): qty 1

## 2020-03-09 NOTE — Plan of Care (Signed)
  Problem: Education: Goal: Knowledge of General Education information will improve Description: Including pain rating scale, medication(s)/side effects and non-pharmacologic comfort measures Outcome: Progressing   Problem: Clinical Measurements: Goal: Ability to maintain clinical measurements within normal limits will improve Outcome: Progressing   Problem: Pain Managment: Goal: General experience of comfort will improve Outcome: Completed/Met

## 2020-03-09 NOTE — Progress Notes (Signed)
Mobility Specialist - Progress Note    03/09/20 1513  Mobility  Activity Refused mobility  Mobility performed by Mobility specialist     Pt declined mobility, stated he had just finished walking. Will attempt at another time.    Kathee Delton Mobility Specialist 03/09/20, 3:14 PM

## 2020-03-09 NOTE — Evaluation (Signed)
Physical Therapy Evaluation Patient Details Name: Ralph Peterson MRN: [id number] DOB: Nov 24, 1965 Today's Date: 03/09/2020   History of Present Illness  Pt is a 54 y.o. male that presented to ED due to SOB, dizziness, and feeling unwell that did not resolve after HD, initally admitted to ICU. PMH of end-stage renal disease, on dialysis for 8 years, diabetes diagnosed at age 44, hypertension, coronary disease with stent, diabetic foot ulcer, covid+ (pt stated he was also diagnosed with MI during hospital stay for covid). did also have elevated HR (afib with RVR per cardiology) and blood sugar in ED.    Clinical Impression  Patient alert, in bed, agreeable to PT, denied pain. Of note pt with transmetatarsal amputation of R foot, L foot wrapped for R toe wound. Pt stated at baseline he is independent, lives with his children.  The patient demonstrated bed mobility modI, supervision for transfers due to use of RW for cueing for hand placement/technique. The patient was able to ambulate ~142ft with RW and CGA/supervision. With increased distance pt exhibited increased SOB. Instructed in standing rest breaks as needed and PLB, good carryover noted. Pt returned to room, up in chair with family at bedside.  Overall the patient demonstrated deficits (see "PT Problem List") that impede the patient's functional abilities, safety, and mobility and would benefit from skilled PT intervention. Recommendation is outpatient PT to maximize function, mobility, and activity tolerance/endurance.     Follow Up Recommendations Outpatient PT    Equipment Recommendations  Rolling walker with 5" wheels    Recommendations for Other Services       Precautions / Restrictions Precautions Precautions: Fall Restrictions Weight Bearing Restrictions: No      Mobility  Bed Mobility Overal bed mobility: Modified Independent                Transfers Overall transfer level: Needs assistance Equipment used:  Rolling walker (2 wheeled) Transfers: Sit to/from Stand Sit to Stand: Supervision            Ambulation/Gait Ambulation/Gait assistance: Min guard;Supervision Gait Distance (Feet): 120 Feet Assistive device: Rolling walker (2 wheeled) Gait Pattern/deviations: WFL(Within Functional Limits)     General Gait Details: With increased distance pt exhibited increased SOB. Instructed in standing rest breaks as needed and PLB, good carryover noted.  Stairs            Wheelchair Mobility    Modified Rankin (Stroke Patients Only)       Balance Overall balance assessment: Needs assistance Sitting-balance support: Feet supported Sitting balance-Leahy Scale: Good       Standing balance-Leahy Scale: Fair Standing balance comment: pt reported increased comfort with RW support during longer ambulation distances                             Pertinent Vitals/Pain Pain Assessment: No/denies pain    Home Living Family/patient expects to be discharged to:: Private residence Living Arrangements: Children Available Help at Discharge: Family Type of Home: House Home Access: Stairs to enter   [employer] of Steps: 3, rails are being put up Home Layout: One level Home Equipment: None      Prior Function Level of Independence: Independent         Comments: drives, cooks, cleans, no falls in the last 6 months     Hand Dominance   Dominant Hand: Left    Extremity/Trunk Assessment   Upper Extremity Assessment Upper Extremity Assessment: Overall [facility]  for tasks assessed    Lower Extremity Assessment Lower Extremity Assessment: Generalized weakness    Cervical / Trunk Assessment Cervical / Trunk Assessment: Normal  Communication   Communication: No difficulties  Cognition Arousal/Alertness: Awake/alert Behavior During Therapy: WFL for tasks assessed/performed Overall Cognitive Status: Within Functional Limits for tasks assessed                                         General Comments      Exercises     Assessment/Plan    PT Assessment Patient needs continued PT services  PT Problem List Decreased activity tolerance;Decreased mobility;Decreased knowledge of use of DME;Cardiopulmonary status limiting activity       PT Treatment Interventions Balance training;DME instruction;Gait training;Neuromuscular re-education;Stair training;Functional mobility training;Patient/family education;Therapeutic activities;Therapeutic exercise    PT Goals (Current goals can be found in the Care Plan section)  Acute Rehab PT Goals Patient Stated Goal: to go home PT Goal Formulation: With patient Time For Goal Achievement: 03/23/20 Potential to Achieve Goals: Good    Frequency Min 2X/week   Barriers to discharge        Co-evaluation               AM-PAC PT "6 Clicks" Mobility  Outcome Measure Help needed turning from your back to your side while in a flat bed without using bedrails?: None Help needed moving from lying on your back to sitting on the side of a flat bed without using bedrails?: None Help needed moving to and from a bed to a chair (including a wheelchair)?: None Help needed standing up from a chair using your arms (e.g., wheelchair or bedside chair)?: A Little Help needed to walk in hospital room?: A Little Help needed climbing 3-5 steps with a railing? : A Little 6 Click Score: 21    End of Session Equipment Utilized During Treatment: Gait belt Activity Tolerance: Patient tolerated treatment well Patient left: in chair;with call bell/phone within reach;with family/visitor present Nurse Communication: Mobility status PT Visit Diagnosis: Other abnormalities of gait and mobility (R26.89)    Time: 1410-1435 PT Time Calculation (min) (ACUTE ONLY): 25 min   Charges:   PT Evaluation $PT Eval Low Complexity: 1 Low PT Treatments $Therapeutic Exercise: 8-22 mins        Lieutenant Diego PT,  DPT 2:56 PM,03/09/20

## 2020-03-09 NOTE — Consult Note (Signed)
[facility] Nurse Consult Note: Reason for Consult: Consult requested for right foot.  Pt states he is followed as an outpatient by podiatry, and they have ordered Betadine dressings Q day.  Pt is well-informed regarding plan of care. Wound type: Right plantar foot/inner toe space with chronic full thickness wound; red and moist, mod amt tan drainage, no odor Measurement: Approx 4X1X.1cm Dressing procedure/placement/frequency: Topical treatment orders provided for bedside nurses to perform daily as was the previous plan of care: Bedside nurse change dressing to right foot Q day as follows: Cleanse foot with betadine-moistened gauze.  Put more Betadine on a nonadherent dressing and apply this to wound, then cover with 4X4 gauze. Wrap with kerlex to hold in place, and wrap kerlex between toe spaces also. Top with Ace wrap. Pt should resume follow-up with podiatry after discharge. Please re-consult if further assistance is needed.  Thank-you,  Julien Girt MSN, [place], [place], [place], [place]

## 2020-03-09 NOTE — Progress Notes (Signed)
ANTICOAGULATION CONSULT NOTE -   Pharmacy Consult for Heparin infusion Indication: chest pain/ACS  Allergies  Allergen Reactions  . Penicillins Rash    Has patient had a PCN reaction causing immediate rash, facial/tongue/throat swelling, SOB or lightheadedness with hypotension: Yes Has patient had a PCN reaction causing severe rash involving mucus membranes or skin necrosis: Yes Has patient had a PCN reaction that required hospitalization: No Has patient had a PCN reaction occurring within the last 10 years: No If all of the above answers are "NO", then may proceed with Cephalosporin use.     Patient Measurements: Height: 6' (182.9 cm) Weight: 114.9 kg (253 lb 4.8 oz) IBW/kg (Calculated) : 77.6 Heparin Dosing Weight: 101.2 kg  Vital Signs: Temp: 97.5 F (36.4 C) (08/19 0403) Temp Source: Oral (08/19 0403) BP: 94/68 (08/19 0403) Pulse Rate: 63 (08/19 0403)  Labs: Recent Labs    03/07/20 0536 03/07/20 0536 03/07/20 0805 03/07/20 1217 03/07/20 1217 03/07/20 1910 03/07/20 1910 03/07/20 2235 03/08/20 0241 03/09/20 0437  HGB 11.3*   < >  --   --   --   --   --   --  11.8* 10.7*  HCT 36.9*  --   --   --   --   --   --   --  38.0* 34.6*  PLT 190  --   --   --   --   --   --   --  182 178  APTT  --   --   --  84*  --   --   --   --   --   --   LABPROT 17.6*  --   --   --   --   --   --   --   --   --   INR 1.5*  --   --   --   --   --   --   --   --   --   HEPARINUNFRC  --   --   --   --   --  0.61  --   --  0.42 0.78*  CREATININE 5.26*   < >  --  5.43*   < > 5.63*   < > 6.20* 5.98* 5.52*  TROPONINIHS 978*  --  2,030*  --   --   --   --   --   --   --    < > = values in this interval not displayed.    Estimated Creatinine Clearance: 20.5 mL/min (A) (by C-G formula based on SCr of 5.52 mg/dL (H)).   Medical History: Past Medical History:  Diagnosis Date  . CAD (coronary artery disease)    a. 09/2010 Cath/PCI ([facility]): LM nl, LAD 64m, D1 80 (small), LCX 71m, OM1 30, RI  70 (small), RCA 70 (DES).  . Coronary artery disease   . COVID-19 virus infection 07/2019  . Diabetes mellitus without complication ([place])   . ESRD (end stage renal disease) ([place])   . Hyperlipidemia   . Hypertension   . Ischemic cardiomyopathy    a.  12/2011 Echo ([facility]): NL EF, mod LVH. Mild AS/MS, triv PR/TR. 07/2019 Echo: EF 25-30%, GR1 DD, inf/post HK, low nl RV fxn, mildly dil RA, triv MR, mild Ao sclerosis w/o stenosis.  . NSTEMI (non-ST elevated myocardial infarction) ([place]) 07/2019  . PAD (peripheral artery disease) ([place])    a. 03/2016 s/p PTA/DEB R SFA/popliteal/peroneal; b. 2017 s/p amputation of R  3rd toe; c. 08/2017 Atherectomy/DEB dist L SFA/popliteal. PTA of L AT; d. 06/2018 PTA/DEB L SFA/popliteal/PT/AT; e. 01/2019 Stable ABIs.  . Sleep apnea     Assessment: Pharmacy has been consulted for heparin dosing and monitoring for a 54yo male who presents with possible NSTEMI. PMH significant for ESRD on HD (MWF), CAD, DM, and systolic CHF (EF 92-92%). Upon admission patient was in Afib with RVR. Per chart review, patient does not have anticoagulation prior to admission.   aPTT pending Hgb 11.3; Plt 190 INR slightly elevated on admission at 1.5 possible due to fluid overload from incomplete dialysis session / hepatic congestion / hemodynamic compromise  0817 1910 HL 0.61 0818 0241 HL 0.42, therapeutic x 2, CBC stable 0819 0437 hl 0.78, SUPRAtherapeutic, CBC stable  Goal of Therapy:  Heparin level 0.3-0.7 units/ml Monitor platelets by anticoagulation protocol: Yes   Plan:  Decrease heparin infusion to 1200 units/hr and recheck HL in 6 hours Check daily CBC while on heparin Continue to monitor H&H and platelets  Ena Dawley, PharmD Clinical Pharmacist 03/09/2020 5:58 AM

## 2020-03-09 NOTE — Progress Notes (Signed)
[address] [place], [place] [address] Phone [phone]. Fax [phone]  Date: 03/09/2020                  Patient Name:  Ralph Peterson  MRN: [id number]  DOB: 01-08-1966  Age / Sex: 54 y.o., male         PCP: Vidal Schwalbe, MD                 Service Requesting Consult: IM/ Deatra James, MD                 Reason for Consult: ESRD            History of Present Illness: Patient is a 54 y.o. male with medical problems of end-stage renal disease, on dialysis for 8 years, diabetes diagnosed at age 11, hypertension, coronary disease with stent, diabetic foot ulcer, who was admitted to [facility] on 03/07/2020 for evaluation of dizziness after hemodialysis was not resolved.   Patient reports that during the last 30 minutes of his hemodialysis yesterday, he felt dizzy.  He did not get better in the evening at home.  He felt fatigued.  He reports he was evaluated for shortness of breath and diagnosed with bronchitis last Friday.  He was prescribed prednisone.  Since then his blood sugars have been running high.  In the ER, his blood sugar was in the 700s. Therefore, he is being admitted for further evaluation.  Patient reports feeling better today, still on on iv amiodarone.    Able to eat without nausea Pulse rate now controlled    Current medications: Current Facility-Administered Medications  Medication Dose Route Frequency Provider Last Rate Last Admin  . albuterol (PROVENTIL) (2.5 MG/3ML) 0.083% nebulizer solution 5 mg  5 mg Nebulization Once Shahmehdi, Seyed A, MD      . apixaban (ELIQUIS) tablet 5 mg  5 mg Oral BID Callwood, Dwayne D, MD   5 mg at 03/09/20 0844  . atorvastatin (LIPITOR) tablet 40 mg  40 mg Oral QPM Shahmehdi, Seyed A, MD   40 mg at 03/08/20 1617  . carvedilol (COREG) tablet 12.5 mg  12.5 mg Oral BID WC Callwood, Dwayne D, MD   12.5 mg at 03/09/20 0844  . Chlorhexidine Gluconate Cloth 2 % PADS 6 each  6 each Topical Q0600 Deatra James, MD   6 each at 03/09/20 0553  . cinacalcet (SENSIPAR) tablet 30 mg  30 mg Oral Daily Shahmehdi, Seyed A, MD   30 mg at 03/09/20 0845  . dextrose 50 % solution 0-50 mL  0-50 mL Intravenous PRN Shahmehdi, Seyed A, MD      . docusate sodium (COLACE) capsule 100 mg  100 mg Oral BID PRN Shahmehdi, Seyed A, MD      . escitalopram (LEXAPRO) tablet 20 mg  20 mg Oral Daily Shahmehdi, Seyed A, MD   20 mg at 03/09/20 0845  . fluticasone (FLONASE) 50 MCG/ACT nasal spray 2 spray  2 spray Each Nare QPM Shahmehdi, Seyed A, MD   2 spray at 03/08/20 1616  . glipiZIDE (GLUCOTROL) tablet 5 mg  5 mg Oral QAC breakfast Shahmehdi, Seyed A, MD   5 mg at 03/09/20 0845  . hydrALAZINE (APRESOLINE) injection 10-20 mg  10-20 mg Intravenous Q4H PRN Shahmehdi, Seyed A, MD      . insulin aspart (novoLOG) injection 0-5 Units  0-5 Units Subcutaneous QHS Deatra James, MD   3 Units at 03/08/20 2109  . insulin  aspart (novoLOG) injection 0-6 Units  0-6 Units Subcutaneous TID WC Deatra James, MD   2 Units at 03/09/20 1213  . insulin detemir (LEVEMIR) injection 5 Units  5 Units Subcutaneous BID Deatra James, MD   5 Units at 03/09/20 0847  . MEDLINE mouth rinse  15 mL Mouth Rinse BID Shahmehdi, Seyed A, MD   15 mL at 03/08/20 2111  . metoprolol tartrate (LOPRESSOR) injection 5 mg  5 mg Intravenous Q2H PRN Skipper Cliche A, MD   5 mg at 03/07/20 2025  . montelukast (SINGULAIR) tablet 10 mg  10 mg Oral Daily Shahmehdi, Seyed A, MD   10 mg at 03/09/20 0844  . polyethylene glycol (MIRALAX / GLYCOLAX) packet 17 g  17 g Oral Daily PRN Shahmehdi, Valeria Batman, MD      . Derrill Memo ON 03/10/2020] predniSONE (DELTASONE) tablet 5 mg  5 mg Oral Q breakfast Shahmehdi, Seyed A, MD      . sevelamer carbonate (RENVELA) tablet 3,200 mg  3,200 mg Oral TID WC Shahmehdi, Seyed A, MD   3,200 mg at 03/09/20 1213   And  . sevelamer carbonate (RENVELA) tablet 800 mg  800 mg Oral With snacks Shahmehdi, Seyed A, MD      . sodium chloride flush (NS)  0.9 % injection 10 mL  10 mL Intravenous Q12H Shahmehdi, Seyed A, MD   10 mL at 03/08/20 2111      Vital Signs: Blood pressure 103/67, pulse 65, temperature 98.4 F (36.9 C), temperature source Oral, resp. rate 16, height 6' (1.829 m), weight 114.9 kg, SpO2 94 %.   Intake/Output Summary (Last 24 hours) at 03/09/2020 1331 Last data filed at 03/09/2020 0300 Gross per 24 hour  Intake 692.77 ml  Output 0 ml  Net 692.77 ml    Weight trends: Filed Weights   03/07/20 1141 03/08/20 0419 03/09/20 0403  Weight: 111 kg 115.7 kg 114.9 kg   Physical Exam: General:  Well appearing,No acute distress, lying in the bed  HEENT  Normocephalic,anicteric, Mucous membranes are moist  Pulm/lungs  CTABL,No accessory muscle use.  CVS/Heart  no rub or gallop,  Abdomen:   Soft, non tender,non distended  Extremities:  + peripheral edema, rt foot with dressing and boot on  Neurologic:  Alert, oriented, able to follow commands,speech clear  Skin:  Warm,dry.No acute lesions or rashes.Good texture and turgor  Rt Arm AVF   Lab results: Basic Metabolic Panel: Recent Labs  Lab 03/07/20 0536 03/07/20 0805 03/07/20 1217 03/07/20 2235 03/08/20 0241 03/09/20 0437  NA 125*  --    < > 132* 129* 133*  K 4.5  --    < > 4.3 4.4 4.2  CL 83*  --    < > 88* 89* 91*  CO2 26  --    < > 25 22 27   GLUCOSE 792*  --    < > 87 122* 273*  BUN 41*  --    < > 49* 51* 47*  CREATININE 5.26*  --    < > 6.20* 5.98* 5.52*  CALCIUM 8.2*  --    < > 8.9 8.7* 8.0*  MG 2.4  --   --   --  2.6* 2.2  PHOS  --  3.3  --   --  4.2 3.7   < > = values in this interval not displayed.    Liver Function Tests: Recent Labs  Lab 03/07/20 0536 03/08/20 0241 03/09/20 0437  AST 20  --   --  ALT 37  --   --   ALKPHOS 169*  --   --   BILITOT 1.1  --   --   PROT 6.1*  --   --   ALBUMIN 3.2*   < > 2.7*   < > = values in this interval not displayed.   No results for input(s): LIPASE, AMYLASE in the last 168 hours. No results for  input(s): AMMONIA in the last 168 hours.  CBC: Recent Labs  Lab 03/08/20 0241 03/09/20 0437  WBC 17.5* 13.5*  NEUTROABS 14.2* 11.2*  HGB 11.8* 10.7*  HCT 38.0* 34.6*  MCV 94.1 93.5  PLT 182 178    Cardiac Enzymes: No results for input(s): CKTOTAL, TROPONINI in the last 168 hours.  BNP: Invalid input(s): POCBNP  CBG: Recent Labs  Lab 03/08/20 1123 03/08/20 1525 03/08/20 2053 03/09/20 0754 03/09/20 1201  GLUCAP 222* 140* 263* 250* 209*    Microbiology: Recent Results (from the past 720 hour(s))  SARS Coronavirus 2 by RT PCR (hospital order, performed in [facility] hospital lab) Nasopharyngeal Nasopharyngeal Swab     Status: None   Collection Time: 03/03/20 11:02 AM   Specimen: Nasopharyngeal Swab  Result Value Ref Range Status   SARS Coronavirus 2 NEGATIVE NEGATIVE Final    Comment: (NOTE) SARS-CoV-2 target nucleic acids are NOT DETECTED.  The SARS-CoV-2 RNA is generally detectable in upper and lower respiratory specimens during the acute phase of infection. The lowest concentration of SARS-CoV-2 viral copies this assay can detect is 250 copies / mL. A negative result does not preclude SARS-CoV-2 infection and should not be used as the sole basis for treatment or other patient management decisions.  A negative result may occur with improper specimen collection / handling, submission of specimen other than nasopharyngeal swab, presence of viral mutation(s) within the areas targeted by this assay, and inadequate number of viral copies (<250 copies / mL). A negative result must be combined with clinical observations, patient history, and epidemiological information.  Fact Sheet for Patients:   StrictlyIdeas.no  Fact Sheet for Healthcare Providers: BankingDealers.co.za  This test is not yet approved or  cleared by the [place] FDA and has been authorized for detection and/or diagnosis of SARS-CoV-2 by FDA  under an Emergency Use Authorization (EUA).  This EUA will remain in effect (meaning this test can be used) for the duration of the COVID-19 declaration under Section 564(b)(1) of the Act, 21 U.S.C. section 360bbb-3(b)(1), unless the authorization is terminated or revoked sooner.  Performed at [facility], [address] [place], [place] [address]   SARS Coronavirus 2 by RT PCR (hospital order, performed in [facility] hospital lab) Nasopharyngeal Nasopharyngeal Swab     Status: None   Collection Time: 03/07/20  5:36 AM   Specimen: Nasopharyngeal Swab  Result Value Ref Range Status   SARS Coronavirus 2 NEGATIVE NEGATIVE Final    Comment: (NOTE) SARS-CoV-2 target nucleic acids are NOT DETECTED.  The SARS-CoV-2 RNA is generally detectable in upper and lower respiratory specimens during the acute phase of infection. The lowest concentration of SARS-CoV-2 viral copies this assay can detect is 250 copies / mL. A negative result does not preclude SARS-CoV-2 infection and should not be used as the sole basis for treatment or other patient management decisions.  A negative result may occur with improper specimen collection / handling, submission of specimen other than nasopharyngeal swab, presence of viral mutation(s) within the areas targeted by this assay, and inadequate number of viral  copies (<250 copies / mL). A negative result must be combined with clinical observations, patient history, and epidemiological information.  Fact Sheet for Patients:   StrictlyIdeas.no  Fact Sheet for Healthcare Providers: BankingDealers.co.za  This test is not yet approved or  cleared by the [place] FDA and has been authorized for detection and/or diagnosis of SARS-CoV-2 by FDA under an Emergency Use Authorization (EUA).  This EUA will remain in effect (meaning this test can be used) for the duration of the COVID-19 declaration under  Section 564(b)(1) of the Act, 21 U.S.C. section 360bbb-3(b)(1), unless the authorization is terminated or revoked sooner.  Performed at [facility], [address] [place], [place] [address]   CULTURE, BLOOD (ROUTINE X 2) w Reflex to ID Panel     Status: None (Preliminary result)   Collection Time: 03/07/20  8:05 AM   Specimen: BLOOD  Result Value Ref Range Status   Specimen Description BLOOD LEFT FOREARM  Final   Special Requests   Final    BOTTLES DRAWN AEROBIC AND ANAEROBIC Blood Culture adequate volume   Culture   Final    NO GROWTH 2 DAYS Performed at [facility], [address], [place], [place] [address]    Report Status PENDING  Incomplete  CULTURE, BLOOD (ROUTINE X 2) w Reflex to ID Panel     Status: None (Preliminary result)   Collection Time: 03/07/20  8:05 AM   Specimen: BLOOD  Result Value Ref Range Status   Specimen Description BLOOD LEFT POSTERIOR SRM  Final   Special Requests   Final    BOTTLES DRAWN AEROBIC AND ANAEROBIC Blood Culture adequate volume   Culture   Final    NO GROWTH 2 DAYS Performed at [facility], [address] [place], [place] [address]    Report Status PENDING  Incomplete  MRSA PCR Screening     Status: None   Collection Time: 03/07/20 12:17 PM   Specimen: Nasal Mucosa; Nasopharyngeal  Result Value Ref Range Status   MRSA by PCR NEGATIVE NEGATIVE Final    Comment:        The GeneXpert MRSA Assay (FDA approved for NASAL specimens only), is one component of a comprehensive MRSA colonization surveillance program. It is not intended to diagnose MRSA infection nor to guide or monitor treatment for MRSA infections. Performed at [facility], [address] [place], [place] [address]      Coagulation Studies: Recent Labs    03/07/20 0536  LABPROT 17.6*  INR 1.5*    Urinalysis: No results for input(s): COLORURINE, LABSPEC, PHURINE, GLUCOSEU, HGBUR, BILIRUBINUR, KETONESUR, PROTEINUR,  UROBILINOGEN, NITRITE, LEUKOCYTESUR in the last 72 hours.  Invalid input(s): APPERANCEUR   Scheduled Meds: . albuterol  5 mg Nebulization Once  . apixaban  5 mg Oral BID  . atorvastatin  40 mg Oral QPM  . carvedilol  12.5 mg Oral BID WC  . Chlorhexidine Gluconate Cloth  6 each Topical Q0600  . cinacalcet  30 mg Oral Daily  . escitalopram  20 mg Oral Daily  . fluticasone  2 spray Each Nare QPM  . glipiZIDE  5 mg Oral QAC breakfast  . insulin aspart  0-5 Units Subcutaneous QHS  . insulin aspart  0-6 Units Subcutaneous TID WC  . insulin detemir  5 Units Subcutaneous BID  . mouth rinse  15 mL Mouth Rinse BID  . montelukast  10 mg Oral Daily  . [START ON 03/10/2020] predniSONE  5 mg Oral Q  breakfast  . sevelamer carbonate  3,200 mg Oral TID WC   And  . sevelamer carbonate  800 mg Oral With snacks  . sodium chloride flush  10 mL Intravenous Q12H   Continuous Infusions:  PRN Meds:.dextrose, docusate sodium, hydrALAZINE, metoprolol tartrate, polyethylene glycol    Imaging: No results found.   Assessment & Plan: Pt is a 54 y.o.   male with medical problems of end-stage renal disease, on dialysis for 8 years, diabetes diagnosed at age 70, hypertension, coronary disease with stent, diabetic foot ulcer, LVEF less than 20% in August 2021, severe pulmonary hypertension, severe systolic CHF, atrial fibrillation, diastolic dysfunction, was admitted on 03/07/2020 with Hyperglycemia [R73.9] Acute respiratory failure with hypoxia (HCC) [J96.01] Elevated troponin I level [R77.8] Non-sustained ventricular tachycardia (HCC) [I47.2] Atrial fibrillation with RVR ([place]) [I48.91] ESRD on hemodialysis ([place]) [N18.6, Z61.0] Severe systolic congestive heart failure ([place]) [I50.20]   [facility]/MWF/[facility]/right arm AV fistula   #End-stage renal disease  patient tolerated dialysis well yesterday No shortness of breath or edema today Potassium 4.2 Plan for dialysis on Friday if still  admitted.  #Anemia of chronic kidney disease  Lab Results  Component Value Date   HGB 10.7 (L) 03/09/2020  Low-dose Epogen for hemoglobin less than 11  #Secondary hyperparathyroidism Lab Results  Component Value Date   PTH 246 (H) 07/28/2019   CALCIUM 8.0 (L) 03/09/2020   PHOS 3.7 03/09/2020    Monitor calcium and phosphorus during admission,  Continue Sensipar and Renvela at home doses with meals  #Cardiac arrhythmia, AFib with RVR Currently on IV amiodarone, IV heparin 2D echo done on August 17 shows LVEF less than 20% with global hypokinesis.  Grade, tricuspid regurgitation.  Left ventricular cavity moderately to severely dilated.  Right ventricular function is also severely reduced with ventricular size moderately enlarged.  Severely elevated pulmonary artery systolic pressure.  Dilated both left and right atrium.  Moderate to severe tricuspid regurgitation. Rate controlled now. 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 Any transcription errors are unintentional

## 2020-03-09 NOTE — Progress Notes (Signed)
[facility] Cardiology    SUBJECTIVE: Patient resting comfortably states she feels reasonably well no chest pain or shortness of breath no palpitations tachycardia had dialysis yesterday and feels reasonably well has not been ambulating in the halls but has been up to and from the bathroom in his room.   Vitals:   03/08/20 1737 03/08/20 1938 03/09/20 0100 03/09/20 0403  BP: 112/81 101/62 106/69 94/68  Pulse: 85 (!) 103 64 63  Resp: 18     Temp: 98.2 F (36.8 C) 97.7 F (36.5 C) 98.2 F (36.8 C) (!) 97.5 F (36.4 C)  TempSrc:  Oral Oral Oral  SpO2: 95% 94% 96% 99%  Weight:    114.9 kg  Height:         Intake/Output Summary (Last 24 hours) at 03/09/2020 0724 Last data filed at 03/09/2020 0300 Gross per 24 hour  Intake 1206.34 ml  Output 0 ml  Net 1206.34 ml      PHYSICAL EXAM  General: Well developed, well nourished, in no acute distress HEENT:  Normocephalic and atramatic Neck:  No JVD.  Lungs: Clear bilaterally to auscultation and percussion. Heart: HRRR . Normal S1 and S2 without gallops or murmurs.  Abdomen: Bowel sounds are positive, abdomen soft and non-tender  Msk:  Back normal, normal gait. Normal strength and tone for age. Extremities: No clubbing, cyanosis or edema.   Neuro: Alert and oriented X 3. Psych:  Good affect, responds appropriately   LABS: Basic Metabolic Panel: Recent Labs    03/07/20 0536 03/07/20 0805 03/08/20 0241 03/09/20 0437  NA 125*   < > 129* 133*  K 4.5   < > 4.4 4.2  CL 83*   < > 89* 91*  CO2 26   < > 22 27  GLUCOSE 792*   < > 122* 273*  BUN 41*   < > 51* 47*  CREATININE 5.26*   < > 5.98* 5.52*  CALCIUM 8.2*   < > 8.7* 8.0*  MG 2.4  --  2.6*  --   PHOS  --    < > 4.2 3.7   < > = values in this interval not displayed.   Liver Function Tests: Recent Labs    03/07/20 0536 03/07/20 0536 03/08/20 0241 03/09/20 0437  AST 20  --   --   --   ALT 37  --   --   --   ALKPHOS 169*  --   --   --   BILITOT 1.1  --   --   --   PROT  6.1*  --   --   --   ALBUMIN 3.2*   < > 3.0* 2.7*   < > = values in this interval not displayed.   No results for input(s): LIPASE, AMYLASE in the last 72 hours. CBC: Recent Labs    03/08/20 0241 03/09/20 0437  WBC 17.5* 13.5*  NEUTROABS 14.2* 11.2*  HGB 11.8* 10.7*  HCT 38.0* 34.6*  MCV 94.1 93.5  PLT 182 178   Cardiac Enzymes: No results for input(s): CKTOTAL, CKMB, CKMBINDEX, TROPONINI in the last 72 hours. BNP: Invalid input(s): POCBNP D-Dimer: No results for input(s): DDIMER in the last 72 hours. Hemoglobin A1C: No results for input(s): HGBA1C in the last 72 hours. Fasting Lipid Panel: No results for input(s): CHOL, HDL, LDLCALC, TRIG, CHOLHDL, LDLDIRECT in the last 72 hours. Thyroid Function Tests: Recent Labs    03/07/20 0536  TSH 5.305*   Anemia Panel: No results for input(s):  VITAMINB12, FOLATE, FERRITIN, TIBC, IRON, RETICCTPCT in the last 72 hours.  ECHOCARDIOGRAM COMPLETE  Result Date: 03/08/2020    ECHOCARDIOGRAM REPORT   Patient Name:   Ralph Peterson Date of Exam: 03/07/2020 Medical Rec #:  [id number]            Height:       72.0 in Accession #:    [id number]           Weight:       245.0 lb Date of Birth:  11-11-1965            BSA:          2.322 m Patient Age:    54 years             BP:           112/72 mmHg Patient Gender: M                    HR:           128 bpm. Exam Location:  [facility] Procedure: 2D Echo, Color Doppler, Cardiac Doppler and Intracardiac            Opacification Agent Indications:     R94.31 Abnormal ECG  History:         Patient has prior history of Echocardiogram examinations, most                  recent 07/27/2019. Previous Myocardial Infarction, PAD and ESRD;                  Risk Factors:Sleep Apnea, Hypertension, Dyslipidemia and                  Diabetes. Pt tested positive for covid-19 in 07/2019.  Sonographer:     Charmayne Sheer RDCS (AE) Referring Phys:  [id number] Bradly Bienenstock Diagnosing Phys: Yolonda Kida MD  Sonographer  Comments: Technically difficult study due to poor echo windows, no apical window and no subcostal window. Image acquisition challenging due to patient body habitus. IMPRESSIONS  1. Left ventricular ejection fraction, by estimation, is <20%. The left ventricle has severely decreased function. The left ventricle demonstrates global hypokinesis. The left ventricular internal cavity size was moderately to severely dilated. Left ventricular diastolic parameters are consistent with Grade II diastolic dysfunction (pseudonormalization).  2. Right ventricular systolic function is severely reduced. The right ventricular size is moderately enlarged. Moderately increased right ventricular wall thickness. There is severely elevated pulmonary artery systolic pressure.  3. Left atrial size was moderately dilated.  4. Right atrial size was moderately dilated.  5. The mitral valve is abnormal. Moderate mitral valve regurgitation.  6. Tricuspid valve regurgitation is moderate to severe.  7. The aortic valve is abnormal. Aortic valve regurgitation is not visualized. Mild aortic valve stenosis. FINDINGS  Left Ventricle: Left ventricular ejection fraction, by estimation, is <20%. The left ventricle has severely decreased function. The left ventricle demonstrates global hypokinesis. Definity contrast agent was given IV to delineate the left ventricular endocardial borders. The left ventricular internal cavity size was moderately to severely dilated. There is no left ventricular hypertrophy. Left ventricular diastolic parameters are consistent with Grade II diastolic dysfunction (pseudonormalization). Right Ventricle: The right ventricular size is moderately enlarged. Moderately increased right ventricular wall thickness. Right ventricular systolic function is severely reduced. There is severely elevated pulmonary artery systolic pressure. The tricuspid regurgitant velocity is 3.60 m/s, and with an assumed right atrial pressure of 10  mmHg, the estimated right ventricular systolic pressure is 81.8 mmHg. Left Atrium: Left atrial size was moderately dilated. Right Atrium: Right atrial size was moderately dilated. Pericardium: There is no evidence of pericardial effusion. Mitral Valve: The mitral valve is abnormal. Moderate mitral valve regurgitation. MV peak gradient, 6.2 mmHg. The mean mitral valve gradient is 3.0 mmHg. Tricuspid Valve: The tricuspid valve is grossly normal. Tricuspid valve regurgitation is moderate to severe. Aortic Valve: The aortic valve is abnormal. . There is moderate thickening and moderate calcification of the aortic valve. Aortic valve regurgitation is not visualized. Mild aortic stenosis is present. Mild aortic valve annular calcification. There is moderate thickening of the aortic valve. There is moderate calcification of the aortic valve. Pulmonic Valve: The pulmonic valve was grossly normal. Pulmonic valve regurgitation is not visualized. Aorta: The aortic root is normal in size and structure. IAS/Shunts: No atrial level shunt detected by color flow Doppler.  LEFT VENTRICLE PLAX 2D LVIDd:         4.72 cm LVIDs:         4.52 cm LV PW:         1.43 cm LV IVS:        1.11 cm LVOT diam:     2.10 cm LV SV:         23 LV SV Index:   10 LVOT Area:     3.46 cm  LEFT ATRIUM           Index LA diam:      6.30 cm 2.71 cm/m LA Vol (A4C): 82.8 ml 35.65 ml/m  AORTIC VALVE             PULMONIC VALVE LVOT Vmax:   49.50 cm/s  PV Vmax:       0.79 m/s LVOT Vmean:  34.000 cm/s PV Vmean:      51.400 cm/s LVOT VTI:    0.066 m     PV VTI:        0.106 m                          PV Peak grad:  2.5 mmHg AORTA                    PV Mean grad:  1.0 mmHg Ao Root diam: 3.30 cm  MITRAL VALVE                TRICUSPID VALVE MV Area (PHT): 7.41 cm     TR Peak grad:   51.8 mmHg MV Peak grad:  6.2 mmHg     TR Vmax:        360.00 cm/s MV Mean grad:  3.0 mmHg MV Vmax:       1.25 m/s     SHUNTS MV Vmean:      81.1 cm/s    Systemic VTI:  0.07 m MV Decel  Time: 102 msec     Systemic Diam: 2.10 cm MV E velocity: 110.33 cm/s MV A velocity: 16.00 cm/s MV E/A ratio:  6.90 Previn Jian D Harold Moncus MD Electronically signed by Yolonda Kida MD Signature Date/Time: 03/08/2020/5:38:26 PM    Final      Echo severely depressed left ventricular function global ejection fraction less than 25%  TELEMETRY: Normal sinus rhythm wide-complex rate of 60:  ASSESSMENT AND PLAN:  Principal Problem:   Acute respiratory failure with hypoxia (HCC) Active Problems:   CAD (coronary artery disease)   Diabetes  mellitus ([place])   Dialysis patient ([place])   ESRD (end stage renal disease) ([place])   Hyperlipidemia, unspecified   Hypertension   Stented coronary artery   Elevated troponin   Acute congestive heart failure (HCC)   Pressure injury of skin   Atrial fibrillation with RVR ([place])    Plan Patient currently on telemetry converted to sinus rhythm rate in the 60s stable Recommend switching to p.o. amiodarone from IV Recommend switching to long-term anticoagulation with Eliquis 2.5 twice a day Continue with dialysis management and control Agree with diabetes management as you are doing Begin to increase activity ambulate in the halls Patient has significant heart failure with depressed left ventricular function Recommend instituting Entresto therapy start low-dose Refer the patient to heart failure clinic Hopefully can be discharged soon Follow-up with Dr. Nehemiah Massed as an outpatient 1 to 2 weeks   Yolonda Kida, MD 03/09/2020 7:24 AM

## 2020-03-09 NOTE — Progress Notes (Signed)
PROGRESS NOTE    Patient: Ralph Peterson                            PCP: Vidal Schwalbe, MD                    DOB: 08-14-1965            DOA: 03/07/2020 VHQ:[id number]             DOS: 03/09/2020, 2:31 PM   LOS: 2 days   Date of Service: The patient was seen and examined on 03/09/2020  Subjective:   The patient was seen and examined ... stable  It looks like he has converted to NS HR 65 this AM   Hemodynamically stable  Was on IV amiodarone drip ... off now  and heparin drip... Patient is due for hemodialysis yesterday   Brief Narrative:   Patient is a 54 y/o M with medical history including diabetes, ESRD (MWF) on HD, CHF (EJF 25-30%) who presented to the ED 8/17 with shortness of breath and dizziness. Subsequently found to be in new onset Afib with RVR and started on amiodarone, and heparin drip. Also was found hyperglycemic with blood glucose level 792, started on insulin drip. BNP elevated as high as 3819, troponin of 978 Cardiology was consulted Nephrology was consulted Patient was initially admitted by PCCM to ICU -with insulin, amiodarone, heparin drip  +COVID-19 about 7 months ago and was in the hospital and also was told he had a heart attack at the time.  Assessment & Plan:   Principal Problem:   Acute respiratory failure with hypoxia (HCC) Active Problems:   Atrial fibrillation with RVR (HCC)   CAD (coronary artery disease)   Diabetes mellitus ([place])   Dialysis patient ([place])   ESRD (end stage renal disease) ([place])   Hyperlipidemia, unspecified   Hypertension   Stented coronary artery   Elevated troponin   Acute congestive heart failure ([place])   Pressure injury of skin  New onset A. fib with RVR -Cardiology consulted following, management per cardiology -converted to NR .. HR 65  -Currently on amiodarone drip ... off now ? chnging to PO today  -Remains on heparin drip... changing to PO Eliquis  -Patient history of vasculopathy, the stents was on Plavix  - on hold  Anticipating patient to be switched to aspirin and chronic anticoagulation   Elevated troponin -likely ischemic demand due to A. fib with RVR, CHF exacerbation -Trend troponin - stable  -Cardiology following -Medication including beta-blocker, amiodarone, Eliquis  -cardiology recommend instituting Entresto  -Follow-up with Dr. Nehemiah Massed   Acute on chronic systolic congestive heart failure with ejection fraction of 25-30% -Continue to monitor I's and O's, on hemodialysis -Echo severely depressed left ventricular function global ejection fraction less than 25% -Monitoring daily weight -Hemodialysis yesterday    Hyperosmolar nonketotic state /with history of diabetes mellitus type 2 -uncontrolled -Status post insulin drip  -Reinitiating home insulin regimen titrating (home medication glipizide -Checking CBG QA CHS with SSI coverage  End-stage renal disease Baseline hemodialysis days Mondays Wednesdays and Fridays -Nephrology consulted -s/p hemodialysis 03/08/2020  Electrode abnormalities, potassium magnesium - will be monitored closely -Repleting accordingly  History of peripheral vascular disease with stents to lower extremities -On Plavix at home,  -May have to be switched to aspirin none chronic anticoagulation per cardiology   Hyperlipidemia Continue statin   Depression -Stable -Home medication of Lexapro   Secondary  hyperparathyroidism -Continue repleting calcium phosphate, Sensipar, Renvela    Antimicrobials:  Consultants:  Cardiology Nephrology PCCM   ------------------------------------------------------------------------------------------------------------------------------------------------  DVT prophylaxis:  [id number] drip Code Status:   Code Status: Full Code Family Communication: No family member present at bedside- The above findings and plan of care has been discussed with patient  in detail,  they expressed understanding and  agreement of above. -Advance care planning has been discussed.   Admission status:    Status is: Inpatient  Remains inpatient appropriate because:Inpatient level of care appropriate due to severity of illness   Dispo: The patient is from: Home              Anticipated d/c is to: Home              Anticipated d/c date is: in AM               Patient currently is not medically stable to d/c... changing meds to PO today         Procedures:   No admission procedures for hospital encounter.    Antimicrobials:  Anti-infectives (From admission, onward)   None       Medication:  . albuterol  5 mg Nebulization Once  . apixaban  5 mg Oral BID  . atorvastatin  40 mg Oral QPM  . carvedilol  12.5 mg Oral BID WC  . Chlorhexidine Gluconate Cloth  6 each Topical Q0600  . cinacalcet  30 mg Oral Daily  . escitalopram  20 mg Oral Daily  . fluticasone  2 spray Each Nare QPM  . glipiZIDE  5 mg Oral QAC breakfast  . insulin aspart  0-5 Units Subcutaneous QHS  . insulin aspart  0-6 Units Subcutaneous TID WC  . insulin detemir  5 Units Subcutaneous BID  . mouth rinse  15 mL Mouth Rinse BID  . montelukast  10 mg Oral Daily  . [START ON 03/10/2020] predniSONE  5 mg Oral Q breakfast  . sevelamer carbonate  3,200 mg Oral TID WC   And  . sevelamer carbonate  800 mg Oral With snacks  . sodium chloride flush  10 mL Intravenous Q12H    dextrose, docusate sodium, hydrALAZINE, metoprolol tartrate, polyethylene glycol   Objective:   Vitals:   03/09/20 0403 03/09/20 0758 03/09/20 1202 03/09/20 1202  BP: 94/68 110/75 103/67 103/67  Pulse: 63 63 65 65  Resp:  17 16 16   Temp: (!) 97.5 F (36.4 C) (!) 97.4 F (36.3 C) 98.4 F (36.9 C) 98.4 F (36.9 C)  TempSrc: Oral Oral Oral Oral  SpO2: 99% 94% 94% 94%  Weight: 114.9 kg     Height:        Intake/Output Summary (Last 24 hours) at 03/09/2020 1431 Last data filed at 03/09/2020 1350 Gross per 24 hour  Intake 932.77 ml  Output 0 ml   Net 932.77 ml   Filed Weights   03/07/20 1141 03/08/20 0419 03/09/20 0403  Weight: 111 kg 115.7 kg 114.9 kg     Examination:      Physical Exam:   General:  Alert, oriented, cooperative, no distress;   HEENT:  Normocephalic, PERRL, otherwise with in Normal limits   Neuro:  CNII-XII intact. , normal motor and sensation, reflexes intact   Lungs:   Clear to auscultation BL, Respirations unlabored, no wheezes / crackles  Cardio:    S1/S2, RRR, No murmure, No Rubs or Gallops   Abdomen:   Soft, non-tender, bowel sounds active all  four quadrants,  no guarding or peritoneal signs.  Muscular skeletal:  Limited exam - in bed, able to move all 4 extremities, Normal strength,  2+ pulses,  symmetric, No pitting edema  Skin:  Dry, warm to touch, negative for any Rashes, No open wounds  Wounds: Please see nursing documentation Pressure Injury 03/07/20 Coccyx Mid Stage 1 -  Intact skin with non-blanchable redness of a localized area usually over a bony prominence. (Active)  03/07/20 1229  Location: Coccyx  Location Orientation: Mid  Staging: Stage 1 -  Intact skin with non-blanchable redness of a localized area usually over a bony prominence.  Wound Description (Comments):   Present on Admission:          ------------------------------------------------------------------------------------------------------------------------------------------    LABs:  CBC Latest Ref Rng & Units 03/09/2020 03/08/2020 03/07/2020  WBC 4.0 - 10.5 K/uL 13.5(H) 17.5(H) 11.7(H)  Hemoglobin 13.0 - 17.0 g/dL 10.7(L) 11.8(L) 11.3(L)  Hematocrit 39 - 52 % 34.6(L) 38.0(L) 36.9(L)  Platelets 150 - 400 K/uL 178 182 190   CMP Latest Ref Rng & Units 03/09/2020 03/08/2020 03/07/2020  Glucose 70 - 99 mg/dL 273(H) 122(H) 87  BUN 6 - 20 mg/dL 47(H) 51(H) 49(H)  Creatinine 0.61 - 1.24 mg/dL 5.52(H) 5.98(H) 6.20(H)  Sodium 135 - 145 mmol/L 133(L) 129(L) 132(L)  Potassium 3.5 - 5.1 mmol/L 4.2 4.4 4.3  Chloride 98 - 111  mmol/L 91(L) 89(L) 88(L)  CO2 22 - 32 mmol/L 27 22 25   Calcium 8.9 - 10.3 mg/dL 8.0(L) 8.7(L) 8.9  Total Protein 6.5 - 8.1 g/dL - - -  Total Bilirubin 0.3 - 1.2 mg/dL - - -  Alkaline Phos 38 - 126 U/L - - -  AST 15 - 41 U/L - - -  ALT 0 - 44 U/L - - -       Micro Results Recent Results (from the past 240 hour(s))  SARS Coronavirus 2 by RT PCR (hospital order, performed in [facility] hospital lab) Nasopharyngeal Nasopharyngeal Swab     Status: None   Collection Time: 03/03/20 11:02 AM   Specimen: Nasopharyngeal Swab  Result Value Ref Range Status   SARS Coronavirus 2 NEGATIVE NEGATIVE Final    Comment: (NOTE) SARS-CoV-2 target nucleic acids are NOT DETECTED.  The SARS-CoV-2 RNA is generally detectable in upper and lower respiratory specimens during the acute phase of infection. The lowest concentration of SARS-CoV-2 viral copies this assay can detect is 250 copies / mL. A negative result does not preclude SARS-CoV-2 infection and should not be used as the sole basis for treatment or other patient management decisions.  A negative result may occur with improper specimen collection / handling, submission of specimen other than nasopharyngeal swab, presence of viral mutation(s) within the areas targeted by this assay, and inadequate number of viral copies (<250 copies / mL). A negative result must be combined with clinical observations, patient history, and epidemiological information.  Fact Sheet for Patients:   StrictlyIdeas.no  Fact Sheet for Healthcare Providers: BankingDealers.co.za  This test is not yet approved or  cleared by the [place] FDA and has been authorized for detection and/or diagnosis of SARS-CoV-2 by FDA under an Emergency Use Authorization (EUA).  This EUA will remain in effect (meaning this test can be used) for the duration of the COVID-19 declaration under Section 564(b)(1) of the Act, 21  U.S.C. section 360bbb-3(b)(1), unless the authorization is terminated or revoked sooner.  Performed at [facility], [address] [place], [place] [address]  SARS Coronavirus 2 by RT PCR (hospital order, performed in [facility] hospital lab) Nasopharyngeal Nasopharyngeal Swab     Status: None   Collection Time: 03/07/20  5:36 AM   Specimen: Nasopharyngeal Swab  Result Value Ref Range Status   SARS Coronavirus 2 NEGATIVE NEGATIVE Final    Comment: (NOTE) SARS-CoV-2 target nucleic acids are NOT DETECTED.  The SARS-CoV-2 RNA is generally detectable in upper and lower respiratory specimens during the acute phase of infection. The lowest concentration of SARS-CoV-2 viral copies this assay can detect is 250 copies / mL. A negative result does not preclude SARS-CoV-2 infection and should not be used as the sole basis for treatment or other patient management decisions.  A negative result may occur with improper specimen collection / handling, submission of specimen other than nasopharyngeal swab, presence of viral mutation(s) within the areas targeted by this assay, and inadequate number of viral copies (<250 copies / mL). A negative result must be combined with clinical observations, patient history, and epidemiological information.  Fact Sheet for Patients:   StrictlyIdeas.no  Fact Sheet for Healthcare Providers: BankingDealers.co.za  This test is not yet approved or  cleared by the [place] FDA and has been authorized for detection and/or diagnosis of SARS-CoV-2 by FDA under an Emergency Use Authorization (EUA).  This EUA will remain in effect (meaning this test can be used) for the duration of the COVID-19 declaration under Section 564(b)(1) of the Act, 21 U.S.C. section 360bbb-3(b)(1), unless the authorization is terminated or revoked sooner.  Performed at [facility], [address]  [place], [place] [address]   CULTURE, BLOOD (ROUTINE X 2) w Reflex to ID Panel     Status: None (Preliminary result)   Collection Time: 03/07/20  8:05 AM   Specimen: BLOOD  Result Value Ref Range Status   Specimen Description BLOOD LEFT FOREARM  Final   Special Requests   Final    BOTTLES DRAWN AEROBIC AND ANAEROBIC Blood Culture adequate volume   Culture   Final    NO GROWTH 2 DAYS Performed at [facility], [address] [place], [place] [address]    Report Status PENDING  Incomplete  CULTURE, BLOOD (ROUTINE X 2) w Reflex to ID Panel     Status: None (Preliminary result)   Collection Time: 03/07/20  8:05 AM   Specimen: BLOOD  Result Value Ref Range Status   Specimen Description BLOOD LEFT POSTERIOR SRM  Final   Special Requests   Final    BOTTLES DRAWN AEROBIC AND ANAEROBIC Blood Culture adequate volume   Culture   Final    NO GROWTH 2 DAYS Performed at [facility], [address] [place], [place] [address]    Report Status PENDING  Incomplete  MRSA PCR Screening     Status: None   Collection Time: 03/07/20 12:17 PM   Specimen: Nasal Mucosa; Nasopharyngeal  Result Value Ref Range Status   MRSA by PCR NEGATIVE NEGATIVE Final    Comment:        The GeneXpert MRSA Assay (FDA approved for NASAL specimens only), is one component of a comprehensive MRSA colonization surveillance program. It is not intended to diagnose MRSA infection nor to guide or monitor treatment for MRSA infections. Performed at [facility], [address], [place], [place] [address]     Radiology Reports DG Chest 2 View  Result Date: 03/03/2020 CLINICAL DATA:  Patient complains of sob and cough. X 1 week. Patient states that he  saw his PCP last week and was diagnosed with bronchitis. Patient states that he is not getting any better on meds. EXAM: CHEST - 2 VIEW COMPARISON:  Chest radiograph 07/26/2019 FINDINGS: Stable cardiomediastinal contours with mildly enlarged heart  size. Low lung volumes. Mild right basilar opacities likely reflecting atelectasis. Diffuse bilateral mild interstitial thickening. No focal consolidation. No pneumothorax or pleural effusion. No acute finding in the visualized skeleton. IMPRESSION: Mild right basilar opacities likely atelectasis. Diffuse bilateral mild interstitial thickening could reflect bronchitic changes or trace edema. Electronically Signed   By: Audie Pinto M.D.   On: 03/03/2020 08:53   DG Chest Portable 1 View  Result Date: 03/07/2020 CLINICAL DATA:  Acute shortness of breath.  Arrhythmia. EXAM: PORTABLE CHEST 1 VIEW COMPARISON:  03/03/2020.  07/26/2019. FINDINGS: Mediastinum and hilar structures normal. Cardiomegaly again noted. Normal pulmonary vascularity. Bilateral interstitial prominence again noted. These changes may be chronic. An active interstitial process including pneumonitis and or interstitial edema cannot be excluded. Low lung volumes. No pleural effusion or pneumothorax. IMPRESSION: 1.  Cardiomegaly again noted.  No pulmonary venous congestion. 2. Bilateral interstitial prominence again noted. These changes may be chronic. Active interstitial process including pneumonitis and or interstitial edema cannot be excluded. 3.  Low lung volumes. Electronically Signed   By: Marcello Moores  Register   On: 03/07/2020 06:05   ECHOCARDIOGRAM COMPLETE  Result Date: 03/08/2020    ECHOCARDIOGRAM REPORT   Patient Name:   MORTY ORTWEIN Date of Exam: 03/07/2020 Medical Rec #:  [id number]            Height:       72.0 in Accession #:    [id number]           Weight:       245.0 lb Date of Birth:  03-17-1966            BSA:          2.322 m Patient Age:    65 years             BP:           112/72 mmHg Patient Gender: M                    HR:           128 bpm. Exam Location:  [facility] Procedure: 2D Echo, Color Doppler, Cardiac Doppler and Intracardiac            Opacification Agent Indications:     R94.31 Abnormal ECG  History:          Patient has prior history of Echocardiogram examinations, most                  recent 07/27/2019. Previous Myocardial Infarction, PAD and ESRD;                  Risk Factors:Sleep Apnea, Hypertension, Dyslipidemia and                  Diabetes. Pt tested positive for covid-19 in 07/2019.  Sonographer:     Charmayne Sheer RDCS (AE) Referring Phys:  [id number] Bradly Bienenstock Diagnosing Phys: Yolonda Kida MD  Sonographer Comments: Technically difficult study due to poor echo windows, no apical window and no subcostal window. Image acquisition challenging due to patient body habitus. IMPRESSIONS  1. Left ventricular ejection fraction, by estimation, is <20%. The left ventricle has severely decreased function. The left ventricle demonstrates global hypokinesis. The  left ventricular internal cavity size was moderately to severely dilated. Left ventricular diastolic parameters are consistent with Grade II diastolic dysfunction (pseudonormalization).  2. Right ventricular systolic function is severely reduced. The right ventricular size is moderately enlarged. Moderately increased right ventricular wall thickness. There is severely elevated pulmonary artery systolic pressure.  3. Left atrial size was moderately dilated.  4. Right atrial size was moderately dilated.  5. The mitral valve is abnormal. Moderate mitral valve regurgitation.  6. Tricuspid valve regurgitation is moderate to severe.  7. The aortic valve is abnormal. Aortic valve regurgitation is not visualized. Mild aortic valve stenosis. FINDINGS  Left Ventricle: Left ventricular ejection fraction, by estimation, is <20%. The left ventricle has severely decreased function. The left ventricle demonstrates global hypokinesis. Definity contrast agent was given IV to delineate the left ventricular endocardial borders. The left ventricular internal cavity size was moderately to severely dilated. There is no left ventricular hypertrophy. Left ventricular diastolic  parameters are consistent with Grade II diastolic dysfunction (pseudonormalization). Right Ventricle: The right ventricular size is moderately enlarged. Moderately increased right ventricular wall thickness. Right ventricular systolic function is severely reduced. There is severely elevated pulmonary artery systolic pressure. The tricuspid regurgitant velocity is 3.60 m/s, and with an assumed right atrial pressure of 10 mmHg, the estimated right ventricular systolic pressure is 60.1 mmHg. Left Atrium: Left atrial size was moderately dilated. Right Atrium: Right atrial size was moderately dilated. Pericardium: There is no evidence of pericardial effusion. Mitral Valve: The mitral valve is abnormal. Moderate mitral valve regurgitation. MV peak gradient, 6.2 mmHg. The mean mitral valve gradient is 3.0 mmHg. Tricuspid Valve: The tricuspid valve is grossly normal. Tricuspid valve regurgitation is moderate to severe. Aortic Valve: The aortic valve is abnormal. . There is moderate thickening and moderate calcification of the aortic valve. Aortic valve regurgitation is not visualized. Mild aortic stenosis is present. Mild aortic valve annular calcification. There is moderate thickening of the aortic valve. There is moderate calcification of the aortic valve. Pulmonic Valve: The pulmonic valve was grossly normal. Pulmonic valve regurgitation is not visualized. Aorta: The aortic root is normal in size and structure. IAS/Shunts: No atrial level shunt detected by color flow Doppler.  LEFT VENTRICLE PLAX 2D LVIDd:         4.72 cm LVIDs:         4.52 cm LV PW:         1.43 cm LV IVS:        1.11 cm LVOT diam:     2.10 cm LV SV:         23 LV SV Index:   10 LVOT Area:     3.46 cm  LEFT ATRIUM           Index LA diam:      6.30 cm 2.71 cm/m LA Vol (A4C): 82.8 ml 35.65 ml/m  AORTIC VALVE             PULMONIC VALVE LVOT Vmax:   49.50 cm/s  PV Vmax:       0.79 m/s LVOT Vmean:  34.000 cm/s PV Vmean:      51.400 cm/s LVOT VTI:     0.066 m     PV VTI:        0.106 m                          PV Peak grad:  2.5 mmHg AORTA  PV Mean grad:  1.0 mmHg Ao Root diam: 3.30 cm  MITRAL VALVE                TRICUSPID VALVE MV Area (PHT): 7.41 cm     TR Peak grad:   51.8 mmHg MV Peak grad:  6.2 mmHg     TR Vmax:        360.00 cm/s MV Mean grad:  3.0 mmHg MV Vmax:       1.25 m/s     SHUNTS MV Vmean:      81.1 cm/s    Systemic VTI:  0.07 m MV Decel Time: 102 msec     Systemic Diam: 2.10 cm MV E velocity: 110.33 cm/s MV A velocity: 16.00 cm/s MV E/A ratio:  6.90 Dwayne D Callwood MD Electronically signed by Yolonda Kida MD Signature Date/Time: 03/08/2020/5:38:26 PM    Final     SIGNED: Deatra James, MD, FACP, FHM. [facility],  Pager (please use [email or web address] to page/text)  If 7PM-7AM, please contact night-coverage [email or web address] Ota [facility] 03/09/2020, 2:31 PM

## 2020-03-10 LAB — CBC WITH DIFFERENTIAL/PLATELET
Abs Immature Granulocytes: 0.1 10*3/uL — ABNORMAL HIGH (ref 0.00–0.07)
Basophils Absolute: 0 10*3/uL (ref 0.0–0.1)
Basophils Relative: 0 %
Eosinophils Absolute: 0.1 10*3/uL (ref 0.0–0.5)
Eosinophils Relative: 1 %
HCT: 34.2 % — ABNORMAL LOW (ref 39.0–52.0)
Hemoglobin: 10.8 g/dL — ABNORMAL LOW (ref 13.0–17.0)
Immature Granulocytes: 1 %
Lymphocytes Relative: 9 %
Lymphs Abs: 1.3 10*3/uL (ref 0.7–4.0)
MCH: 28.9 pg (ref 26.0–34.0)
MCHC: 31.6 g/dL (ref 30.0–36.0)
MCV: 91.4 fL (ref 80.0–100.0)
Monocytes Absolute: 0.7 10*3/uL (ref 0.1–1.0)
Monocytes Relative: 5 %
Neutro Abs: 11.3 10*3/uL — ABNORMAL HIGH (ref 1.7–7.7)
Neutrophils Relative %: 84 %
Platelets: 167 10*3/uL (ref 150–400)
RBC: 3.74 MIL/uL — ABNORMAL LOW (ref 4.22–5.81)
RDW: 16.3 % — ABNORMAL HIGH (ref 11.5–15.5)
WBC: 13.5 10*3/uL — ABNORMAL HIGH (ref 4.0–10.5)
nRBC: 0 % (ref 0.0–0.2)

## 2020-03-10 LAB — RENAL FUNCTION PANEL
Albumin: 2.8 g/dL — ABNORMAL LOW (ref 3.5–5.0)
Anion gap: 13 (ref 5–15)
BUN: 62 mg/dL — ABNORMAL HIGH (ref 6–20)
CO2: 28 mmol/L (ref 22–32)
Calcium: 8 mg/dL — ABNORMAL LOW (ref 8.9–10.3)
Chloride: 88 mmol/L — ABNORMAL LOW (ref 98–111)
Creatinine, Ser: 6.98 mg/dL — ABNORMAL HIGH (ref 0.61–1.24)
GFR calc Af Amer: 10 mL/min — ABNORMAL LOW (ref >60)
GFR calc non Af Amer: 8 mL/min — ABNORMAL LOW (ref >60)
Glucose, Bld: 114 mg/dL — ABNORMAL HIGH (ref 70–99)
Phosphorus: 3.4 mg/dL (ref 2.5–4.6)
Potassium: 4.2 mmol/L (ref 3.5–5.1)
Sodium: 129 mmol/L — ABNORMAL LOW (ref 135–145)

## 2020-03-10 LAB — MAGNESIUM: Magnesium: 2.3 mg/dL (ref 1.7–2.4)

## 2020-03-10 LAB — GLUCOSE, CAPILLARY: Glucose-Capillary: 134 mg/dL — ABNORMAL HIGH (ref 70–99)

## 2020-03-10 MED ORDER — HYDROCOD POLST-CPM POLST ER 10-8 MG/5ML PO SUER
5.0000 mL | Freq: Two times a day (BID) | ORAL | Status: DC | PRN
  Administered 2020-03-10: 5 mL via ORAL
  Filled 2020-03-10: qty 5

## 2020-03-10 MED ORDER — APIXABAN 5 MG PO TABS: 5.0000 mg | ORAL_TABLET | Freq: Two times a day (BID) | ORAL | 3 refills | Status: AC

## 2020-03-10 MED ORDER — INSULIN DETEMIR 100 UNIT/ML ~~LOC~~ SOLN: 5.0000 [IU] | Freq: Two times a day (BID) | SUBCUTANEOUS | 3 refills | Status: DC

## 2020-03-10 MED ORDER — CARVEDILOL 12.5 MG PO TABS: 12.5000 mg | ORAL_TABLET | Freq: Two times a day (BID) | ORAL | 0 refills | Status: DC

## 2020-03-10 MED ORDER — EPOETIN ALFA 10000 UNIT/ML IJ SOLN: 4000.0000 [IU] | INTRAMUSCULAR | Status: DC

## 2020-03-10 NOTE — Progress Notes (Signed)
[address] [place], [place] [address] Phone [phone]. Fax [phone]  Date: 03/10/2020                  Patient Name:  Ralph Peterson  MRN: [id number]  DOB: Jun 17, 1966  Age / Sex: 54 y.o., male         PCP: Vidal Schwalbe, MD                 Service Requesting Consult: IM/ Deatra James, MD                 Reason for Consult: ESRD            History of Present Illness: Patient is a 54 y.o. male with medical problems of end-stage renal disease, on dialysis for 8 years, diabetes diagnosed at age 74, hypertension, coronary disease with stent, diabetic foot ulcer, who was admitted to [facility] on 03/07/2020 for evaluation of dizziness after hemodialysis was not resolved.   Patient reports that during the last 30 minutes of his hemodialysis yesterday, he felt dizzy.  He did not get better in the evening at home.  He felt fatigued.  He reports he was evaluated for shortness of breath and diagnosed with bronchitis last Friday.  He was prescribed prednisone.  Since then his blood sugars have been running high.  In the ER, his blood sugar was in the 700s. Therefore, he is being admitted for further evaluation.    HEMODIALYSIS FLOWSHEET:  Blood Flow Rate (mL/min): 400 mL/min Arterial Pressure (mmHg): -180 mmHg Venous Pressure (mmHg): 15 mmHg Transmembrane Pressure (mmHg): 60 mmHg Ultrafiltration Rate (mL/min): 570 mL/min Dialysate Flow Rate (mL/min): 600 ml/min Conductivity: Machine : 13.7 Conductivity: Machine : 13.7 Dialysis Fluid Bolus: Normal Saline Bolus Amount (mL): 250 mL      Current medications: Current Facility-Administered Medications  Medication Dose Route Frequency Provider Last Rate Last Admin  . albuterol (PROVENTIL) (2.5 MG/3ML) 0.083% nebulizer solution 5 mg  5 mg Nebulization Once Shahmehdi, Seyed A, MD      . apixaban (ELIQUIS) tablet 5 mg  5 mg Oral BID Callwood, Dwayne D, MD   5 mg at 03/09/20 2218  . atorvastatin (LIPITOR) tablet 40  mg  40 mg Oral QPM Shahmehdi, Seyed A, MD   40 mg at 03/09/20 1736  . carvedilol (COREG) tablet 12.5 mg  12.5 mg Oral BID WC Callwood, Dwayne D, MD   12.5 mg at 03/09/20 1737  . Chlorhexidine Gluconate Cloth 2 % PADS 6 each  6 each Topical Q0600 Deatra James, MD   6 each at 03/10/20 [phone]  . chlorpheniramine-HYDROcodone (TUSSIONEX) 10-8 MG/5ML suspension 5 mL  5 mL Oral Q12H PRN Lang Snow, NP   5 mL at 03/10/20 0552  . cinacalcet (SENSIPAR) tablet 30 mg  30 mg Oral Daily Shahmehdi, Seyed A, MD   30 mg at 03/10/20 0901  . dextrose 50 % solution 0-50 mL  0-50 mL Intravenous PRN Shahmehdi, Seyed A, MD      . docusate sodium (COLACE) capsule 100 mg  100 mg Oral BID PRN Shahmehdi, Seyed A, MD      . escitalopram (LEXAPRO) tablet 20 mg  20 mg Oral Daily Shahmehdi, Seyed A, MD   20 mg at 03/10/20 0901  . fluticasone (FLONASE) 50 MCG/ACT nasal spray 2 spray  2 spray Each Nare QPM Shahmehdi, Seyed A, MD   2 spray at 03/08/20 1616  . glipiZIDE (GLUCOTROL) tablet 5 mg  5 mg Oral  QAC breakfast Shahmehdi, Seyed A, MD   5 mg at 03/10/20 0901  . hydrALAZINE (APRESOLINE) injection 10-20 mg  10-20 mg Intravenous Q4H PRN Shahmehdi, Seyed A, MD      . insulin aspart (novoLOG) injection 0-5 Units  0-5 Units Subcutaneous QHS Deatra James, MD   3 Units at 03/08/20 2109  . insulin aspart (novoLOG) injection 0-6 Units  0-6 Units Subcutaneous TID WC Deatra James, MD   2 Units at 03/09/20 1213  . insulin detemir (LEVEMIR) injection 5 Units  5 Units Subcutaneous BID Skipper Cliche A, MD   5 Units at 03/10/20 0900  . loperamide (IMODIUM) capsule 2 mg  2 mg Oral PRN Para Skeans, MD   2 mg at 03/09/20 2218  . MEDLINE mouth rinse  15 mL Mouth Rinse BID Shahmehdi, Seyed A, MD   15 mL at 03/10/20 0905  . metoprolol tartrate (LOPRESSOR) injection 5 mg  5 mg Intravenous Q2H PRN Skipper Cliche A, MD   5 mg at 03/07/20 2025  . montelukast (SINGULAIR) tablet 10 mg  10 mg Oral Daily Shahmehdi, Seyed  A, MD   10 mg at 03/10/20 0901  . polyethylene glycol (MIRALAX / GLYCOLAX) packet 17 g  17 g Oral Daily PRN Shahmehdi, Seyed A, MD      . predniSONE (DELTASONE) tablet 5 mg  5 mg Oral Q breakfast Shahmehdi, Seyed A, MD   5 mg at 03/10/20 0902  . sevelamer carbonate (RENVELA) tablet 3,200 mg  3,200 mg Oral TID WC Shahmehdi, Seyed A, MD   3,200 mg at 03/10/20 0901   And  . sevelamer carbonate (RENVELA) tablet 800 mg  800 mg Oral With snacks Shahmehdi, Seyed A, MD   800 mg at 03/09/20 2218  . sodium chloride flush (NS) 0.9 % injection 10 mL  10 mL Intravenous Q12H Shahmehdi, Seyed A, MD   10 mL at 03/10/20 0905      Vital Signs: Blood pressure 140/89, pulse 70, temperature 98.2 F (36.8 C), temperature source Oral, resp. rate (!) 23, height 6' (1.829 m), weight 114.2 kg, SpO2 95 %.   Intake/Output Summary (Last 24 hours) at 03/10/2020 1501 Last data filed at 03/10/2020 1345 Gross per 24 hour  Intake 480 ml  Output 1500 ml  Net -1020 ml    Weight trends: Filed Weights   03/08/20 0419 03/09/20 0403 03/10/20 0435  Weight: 115.7 kg 114.9 kg 114.2 kg   Physical Exam: General:  Well appearing,No acute distress, lying in the bed  HEENT  Normocephalic,anicteric, Mucous membranes are moist  Pulm/lungs  CTABL,No accessory muscle use.  CVS/Heart  no rub or gallop, regular rhythm  Abdomen:   Soft, non tender,non distended  Extremities:  + peripheral edema, rt foot with dressing and boot on  Neurologic:  Resting quietly  Skin:  Warm,dry.No acute lesions or rashes.Good texture and turgor  Rt Arm AVF   Lab results: Basic Metabolic Panel: Recent Labs  Lab 03/08/20 0241 03/09/20 0437 03/10/20 0440  NA 129* 133* 129*  K 4.4 4.2 4.2  CL 89* 91* 88*  CO2 22 27 28   GLUCOSE 122* 273* 114*  BUN 51* 47* 62*  CREATININE 5.98* 5.52* 6.98*  CALCIUM 8.7* 8.0* 8.0*  MG 2.6* 2.2 2.3  PHOS 4.2 3.7 3.4    Liver Function Tests: Recent Labs  Lab 03/07/20 0536 03/08/20 0241 03/10/20 0440  AST  20  --   --   ALT 37  --   --  ALKPHOS 169*  --   --   BILITOT 1.1  --   --   PROT 6.1*  --   --   ALBUMIN 3.2*   < > 2.8*   < > = values in this interval not displayed.   No results for input(s): LIPASE, AMYLASE in the last 168 hours. No results for input(s): AMMONIA in the last 168 hours.  CBC: Recent Labs  Lab 03/09/20 0437 03/10/20 0440  WBC 13.5* 13.5*  NEUTROABS 11.2* 11.3*  HGB 10.7* 10.8*  HCT 34.6* 34.2*  MCV 93.5 91.4  PLT 178 167    Cardiac Enzymes: No results for input(s): CKTOTAL, TROPONINI in the last 168 hours.  BNP: Invalid input(s): POCBNP  CBG: Recent Labs  Lab 03/09/20 0754 03/09/20 1201 03/09/20 1645 03/09/20 2046 03/10/20 0741  GLUCAP 250* 209* 125* 96 134*    Microbiology: Recent Results (from the past 720 hour(s))  SARS Coronavirus 2 by RT PCR (hospital order, performed in [facility] hospital lab) Nasopharyngeal Nasopharyngeal Swab     Status: None   Collection Time: 03/03/20 11:02 AM   Specimen: Nasopharyngeal Swab  Result Value Ref Range Status   SARS Coronavirus 2 NEGATIVE NEGATIVE Final    Comment: (NOTE) SARS-CoV-2 target nucleic acids are NOT DETECTED.  The SARS-CoV-2 RNA is generally detectable in upper and lower respiratory specimens during the acute phase of infection. The lowest concentration of SARS-CoV-2 viral copies this assay can detect is 250 copies / mL. A negative result does not preclude SARS-CoV-2 infection and should not be used as the sole basis for treatment or other patient management decisions.  A negative result may occur with improper specimen collection / handling, submission of specimen other than nasopharyngeal swab, presence of viral mutation(s) within the areas targeted by this assay, and inadequate number of viral copies (<250 copies / mL). A negative result must be combined with clinical observations, patient history, and epidemiological information.  Fact Sheet for Patients:    StrictlyIdeas.no  Fact Sheet for Healthcare Providers: BankingDealers.co.za  This test is not yet approved or  cleared by the [place] FDA and has been authorized for detection and/or diagnosis of SARS-CoV-2 by FDA under an Emergency Use Authorization (EUA).  This EUA will remain in effect (meaning this test can be used) for the duration of the COVID-19 declaration under Section 564(b)(1) of the Act, 21 U.S.C. section 360bbb-3(b)(1), unless the authorization is terminated or revoked sooner.  Performed at [facility], [address] [place], [place] [address]   SARS Coronavirus 2 by RT PCR (hospital order, performed in [facility] hospital lab) Nasopharyngeal Nasopharyngeal Swab     Status: None   Collection Time: 03/07/20  5:36 AM   Specimen: Nasopharyngeal Swab  Result Value Ref Range Status   SARS Coronavirus 2 NEGATIVE NEGATIVE Final    Comment: (NOTE) SARS-CoV-2 target nucleic acids are NOT DETECTED.  The SARS-CoV-2 RNA is generally detectable in upper and lower respiratory specimens during the acute phase of infection. The lowest concentration of SARS-CoV-2 viral copies this assay can detect is 250 copies / mL. A negative result does not preclude SARS-CoV-2 infection and should not be used as the sole basis for treatment or other patient management decisions.  A negative result may occur with improper specimen collection / handling, submission of specimen other than nasopharyngeal swab, presence of viral mutation(s) within the areas targeted by this assay, and inadequate number of viral copies (<250 copies / mL). A negative result must be  combined with clinical observations, patient history, and epidemiological information.  Fact Sheet for Patients:   StrictlyIdeas.no  Fact Sheet for Healthcare Providers: BankingDealers.co.za  This test is not yet approved or   cleared by the [place] FDA and has been authorized for detection and/or diagnosis of SARS-CoV-2 by FDA under an Emergency Use Authorization (EUA).  This EUA will remain in effect (meaning this test can be used) for the duration of the COVID-19 declaration under Section 564(b)(1) of the Act, 21 U.S.C. section 360bbb-3(b)(1), unless the authorization is terminated or revoked sooner.  Performed at [facility], [address] [place], [place] [address]   CULTURE, BLOOD (ROUTINE X 2) w Reflex to ID Panel     Status: None (Preliminary result)   Collection Time: 03/07/20  8:05 AM   Specimen: BLOOD  Result Value Ref Range Status   Specimen Description BLOOD LEFT FOREARM  Final   Special Requests   Final    BOTTLES DRAWN AEROBIC AND ANAEROBIC Blood Culture adequate volume   Culture   Final    NO GROWTH 3 DAYS Performed at [facility], [address] [place], [place] [address]    Report Status PENDING  Incomplete  CULTURE, BLOOD (ROUTINE X 2) w Reflex to ID Panel     Status: None (Preliminary result)   Collection Time: 03/07/20  8:05 AM   Specimen: BLOOD  Result Value Ref Range Status   Specimen Description BLOOD LEFT POSTERIOR SRM  Final   Special Requests   Final    BOTTLES DRAWN AEROBIC AND ANAEROBIC Blood Culture adequate volume   Culture   Final    NO GROWTH 3 DAYS Performed at [facility], [address] [place], [place] [address]    Report Status PENDING  Incomplete  MRSA PCR Screening     Status: None   Collection Time: 03/07/20 12:17 PM   Specimen: Nasal Mucosa; Nasopharyngeal  Result Value Ref Range Status   MRSA by PCR NEGATIVE NEGATIVE Final    Comment:        The GeneXpert MRSA Assay (FDA approved for NASAL specimens only), is one component of a comprehensive MRSA colonization surveillance program. It is not intended to diagnose MRSA infection nor to guide or monitor treatment for MRSA infections. Performed at [facility], [address] [place], [place] [address]      Coagulation Studies: No results for input(s): LABPROT, INR in the last 72 hours.  Urinalysis: No results for input(s): COLORURINE, LABSPEC, PHURINE, GLUCOSEU, HGBUR, BILIRUBINUR, KETONESUR, PROTEINUR, UROBILINOGEN, NITRITE, LEUKOCYTESUR in the last 72 hours.  Invalid input(s): APPERANCEUR   Scheduled Meds: . albuterol  5 mg Nebulization Once  . apixaban  5 mg Oral BID  . atorvastatin  40 mg Oral QPM  . carvedilol  12.5 mg Oral BID WC  . Chlorhexidine Gluconate Cloth  6 each Topical Q0600  . cinacalcet  30 mg Oral Daily  . escitalopram  20 mg Oral Daily  . fluticasone  2 spray Each Nare QPM  . glipiZIDE  5 mg Oral QAC breakfast  . insulin aspart  0-5 Units Subcutaneous QHS  . insulin aspart  0-6 Units Subcutaneous TID WC  . insulin detemir  5 Units Subcutaneous BID  . mouth rinse  15 mL Mouth Rinse BID  . montelukast  10 mg Oral Daily  . predniSONE  5 mg Oral Q breakfast  . sevelamer carbonate  3,200 mg Oral TID WC   And  . sevelamer  carbonate  800 mg Oral With snacks  . sodium chloride flush  10 mL Intravenous Q12H   Continuous Infusions:  PRN Meds:.chlorpheniramine-HYDROcodone, dextrose, docusate sodium, hydrALAZINE, loperamide, metoprolol tartrate, polyethylene glycol    Imaging: No results found.   Assessment & Plan: Pt is a 54 y.o.   male with medical problems of end-stage renal disease, on dialysis for 8 years, diabetes diagnosed at age 60, hypertension, coronary disease with stent, diabetic foot ulcer, LVEF less than 20% in August 2021, severe pulmonary hypertension, severe systolic CHF, atrial fibrillation, diastolic dysfunction, was admitted on 03/07/2020 with Hyperglycemia [R73.9] Acute respiratory failure with hypoxia (HCC) [J96.01] Elevated troponin I level [R77.8] Non-sustained ventricular tachycardia (HCC) [I47.2] Atrial fibrillation with RVR ([place]) [I48.91] ESRD on hemodialysis ([place]) [N18.6,  R60.4] Severe systolic congestive heart failure ([place]) [I50.20]   [facility]/MWF/[facility]/right arm AV fistula   #End-stage renal disease Patient seen during dialysis Tolerating well  Continue routine hemodialysis MWF schedule  #Anemia of chronic kidney disease  Lab Results  Component Value Date   HGB 10.8 (L) 03/10/2020  Low-dose Epogen for hemoglobin less than 11  #Secondary hyperparathyroidism Lab Results  Component Value Date   PTH 246 (H) 07/28/2019   CALCIUM 8.0 (L) 03/10/2020   PHOS 3.4 03/10/2020    Monitor calcium and phosphorus during admission,  Continue Sensipar and Renvela at home doses with meals  #Cardiac arrhythmia, AFib with RVR Currently on IV amiodarone, IV heparin 2D echo done on August 17 shows LVEF less than 20% with global hypokinesis.  Grade, tricuspid regurgitation.  Left ventricular cavity moderately to severely dilated.  Right ventricular function is also severely reduced with ventricular size moderately enlarged.  Severely elevated pulmonary artery systolic pressure.  Dilated both left and right atrium.  Moderate to severe tricuspid regurgitation. Rate controlled now. 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 Any transcription errors are unintentional

## 2020-03-10 NOTE — Care Management Important Message (Signed)
Important Message  Patient Details  Name: Ralph Peterson MRN: [id number] Date of Birth: 1966/06/19   Medicare Important Message Given:  Yes     Dannette Barbara 03/10/2020, 2:12 PM

## 2020-03-10 NOTE — Discharge Summary (Signed)
Physician Discharge Summary [facility]    Patient: Ralph Peterson                   Admit date: 03/07/2020   DOB: [birth year]             Discharge date:03/10/2020/8:50 AM ION:[id number]                          PCP: Vidal Schwalbe, MD  Disposition: Home   Recommendations for Outpatient Follow-up:   . Follow up: in 1 week  Discharge Condition: Stable   Code Status:   Code Status: Full Code  Diet recommendation: Renal diet   Discharge Diagnoses:    Principal Problem:   Acute respiratory failure with hypoxia ([place]) Active Problems:   Atrial fibrillation with RVR ([place])   CAD (coronary artery disease)   Diabetes mellitus ([place])   Dialysis patient ([place])   ESRD (end stage renal disease) ([place])   Hyperlipidemia, unspecified   Hypertension   Stented coronary artery   Elevated troponin   Acute congestive heart failure (HCC)   Pressure injury of skin   History of Present Illness/ Hospital Course Ralph Peterson Summary:   Patient is a 54 y/o M with medical history including diabetes, ESRD (MWF) on HD, CHF (EJF 25-30%) who presented to the ED 8/17 with shortness of breath and dizziness. Subsequently found to be in new onset Afib with RVR and started on amiodarone, and heparin drip. Also was found hyperglycemic with blood glucose level 792, started on insulin drip. BNP elevated as high as 3819, troponin of 978 Cardiology was consulted Nephrology was consulted Patient was initially admitted by PCCM to ICU -with insulin, amiodarone, heparin drip  +COVID-19 about 7 months ago and was in the hospital and also was told he had a heart attack at the time.  New onset A. fib with RVR -Cardiology consulted following, management per cardiology -converted to NR .. HR 65 -68  -Currently on amiodarone drip ... off now ? chnging to PO today  -Remains on heparin drip... changing to PO Eliquis  -Patient history of vasculopathy, the stents was on Plavix - D/Ced   switched to aspirin  and chronic anticoagulation - Eliquis   Elevated troponin -likely ischemic demand due to A. fib with RVR, CHF exacerbation -Trend troponin - stable  -Cardiology was following -Medication including beta-blocker, (amiodarone -OFF now), Eliquis  -cardiology recommend instituting Entresto as out patient  -Follow-up with Dr. Nehemiah Massed   Acute on chronic systolic congestive heart failure with ejection fraction of 25-30% -Continue to monitor I's and O's, on hemodialysis -Echoseverely depressed left ventricular function global ejection fraction less than 25% -Monitoring daily weight -Hemodialysis    Hyperosmolar nonketotic state /with history of diabetes mellitus type 2 -uncontrolled -Status post insulin drip  -Reinitiating home insulin regimen titrating (cont. home medication glipizide_ Added Levemir 5 units twice daily He is instructed to check his blood sugars before meals Levemir dose may be adjusted   End-stage renal disease Baseline hemodialysis days Mondays Wednesdays and Fridays -Nephrology consulted -s/p hemodialysis 03/08/2020  Electrode abnormalities, potassium magnesium - will be monitored closely -Repleting accordingly  History of peripheral vascular disease with stents to lower extremities -On Plavix at home,  -May have to be switched to aspirin none chronic anticoagulation per cardiology   Hyperlipidemia Continue statin   Depression -Stable -Home medication of Lexapro   Secondary hyperparathyroidism -Continue repleting calcium phosphate, Sensipar, Renvela    Antimicrobials:  Consultants:  Cardiology Nephrology PCCM   Discharge Instructions:   Discharge Instructions    (HEART FAILURE PATIENTS) Call MD:  Anytime you have any of the following symptoms: 1) 3 pound weight gain in 24 hours or 5 pounds in 1 week 2) shortness of breath, with or without a dry hacking cough 3) swelling in the hands, feet or stomach 4) if you have to sleep on  extra pillows at night in order to breathe.   Complete by: As directed    AMB referral to CHF clinic   Complete by: As directed    Activity as tolerated - No restrictions   Complete by: As directed    Call MD for:  difficulty breathing, headache or visual disturbances   Complete by: As directed    Call MD for:  persistant nausea and vomiting   Complete by: As directed    Call MD for:  redness, tenderness, or signs of infection (pain, swelling, redness, odor or green/yellow discharge around incision site)   Complete by: As directed    Call MD for:  temperature >100.4   Complete by: As directed    Diet - low sodium heart healthy   Complete by: As directed    Discharge instructions   Complete by: As directed    Please follow-up with cardiology nephrology, continue hemodialysis as scheduled, Check your blood sugars before meals new added medications long-acting insulin Levemir may be adjusted according to blood sugars. May take 1 more dose of prednisone 5 mg x1 more day.   Discharge wound care:   Complete by: As directed    Per wound care instructions   Increase activity slowly   Complete by: As directed        Medication List    STOP taking these medications   acetaminophen 500 MG tablet Commonly known as: TYLENOL   clopidogrel 75 MG tablet Commonly known as: PLAVIX   diphenhydrAMINE 25 MG tablet Commonly known as: BENADRYL   fluticasone 50 MCG/ACT nasal spray Commonly known as: FLONASE     TAKE these medications   albuterol 108 (90 Base) MCG/ACT inhaler Commonly known as: VENTOLIN HFA Inhale 1-2 puffs into the lungs every 6 (six) hours as needed for wheezing or shortness of breath.   apixaban 5 MG Tabs tablet Commonly known as: ELIQUIS Take 1 tablet (5 mg total) by mouth 2 (two) times daily.   aspirin EC 81 MG tablet Take 81 mg by mouth every evening.   atorvastatin 40 MG tablet Commonly known as: LIPITOR Take 40 mg by mouth every evening.   carvedilol 12.5  MG tablet Commonly known as: COREG Take 1 tablet (12.5 mg total) by mouth 2 (two) times daily with a meal. What changed:   medication strength  how much to take   cinacalcet 30 MG tablet Commonly known as: SENSIPAR Take 30 mg by mouth daily.   escitalopram 20 MG tablet Commonly known as: LEXAPRO Take 20 mg by mouth daily.   glipiZIDE 5 MG tablet Commonly known as: GLUCOTROL Take 5 mg by mouth daily before breakfast.   insulin detemir 100 UNIT/ML injection Commonly known as: LEVEMIR Inject 0.05 mLs (5 Units total) into the skin 2 (two) times daily.   montelukast 10 MG tablet Commonly known as: SINGULAIR Take 10 mg by mouth daily.   predniSONE 10 MG tablet Commonly known as: DELTASONE Day 1-2: Take 50mg (5 pills) Day 3-4: Take 40mg (4) Day 5-6: 30mg (3) Day 7-8: 20mg (2) Day 9:10mg (1   sevelamer carbonate 800  MG tablet Commonly known as: RENVELA Take 1,600-3,200 mg by mouth See admin instructions. Take 4 tablets (3200mg ) three times daily with meals and take 1 tablet (800mg ) by mouth with snacks       Allergies  Allergen Reactions  . Penicillins Rash    Has patient had a PCN reaction causing immediate rash, facial/tongue/throat swelling, SOB or lightheadedness with hypotension: Yes Has patient had a PCN reaction causing severe rash involving mucus membranes or skin necrosis: Yes Has patient had a PCN reaction that required hospitalization: No Has patient had a PCN reaction occurring within the last 10 years: No If all of the above answers are "NO", then may proceed with Cephalosporin use.      Procedures /Studies:   DG Chest 2 View  Result Date: 03/03/2020 CLINICAL DATA:  Patient complains of sob and cough. X 1 week. Patient states that he saw his PCP last week and was diagnosed with bronchitis. Patient states that he is not getting any better on meds. EXAM: CHEST - 2 VIEW COMPARISON:  Chest radiograph 07/26/2019 FINDINGS: Stable cardiomediastinal contours with  mildly enlarged heart size. Low lung volumes. Mild right basilar opacities likely reflecting atelectasis. Diffuse bilateral mild interstitial thickening. No focal consolidation. No pneumothorax or pleural effusion. No acute finding in the visualized skeleton. IMPRESSION: Mild right basilar opacities likely atelectasis. Diffuse bilateral mild interstitial thickening could reflect bronchitic changes or trace edema. Electronically Signed   By: Audie Pinto M.D.   On: 03/03/2020 08:53   DG Chest Portable 1 View  Result Date: 03/07/2020 CLINICAL DATA:  Acute shortness of breath.  Arrhythmia. EXAM: PORTABLE CHEST 1 VIEW COMPARISON:  03/03/2020.  07/26/2019. FINDINGS: Mediastinum and hilar structures normal. Cardiomegaly again noted. Normal pulmonary vascularity. Bilateral interstitial prominence again noted. These changes may be chronic. An active interstitial process including pneumonitis and or interstitial edema cannot be excluded. Low lung volumes. No pleural effusion or pneumothorax. IMPRESSION: 1.  Cardiomegaly again noted.  No pulmonary venous congestion. 2. Bilateral interstitial prominence again noted. These changes may be chronic. Active interstitial process including pneumonitis and or interstitial edema cannot be excluded. 3.  Low lung volumes. Electronically Signed   By: Marcello Moores  Register   On: 03/07/2020 06:05   ECHOCARDIOGRAM COMPLETE  Result Date: 03/08/2020    ECHOCARDIOGRAM REPORT   Patient Name:   TEDD COTTRILL Date of Exam: 03/07/2020 Medical Rec #:  [id number]            Height:       72.0 in Accession #:    [id number]           Weight:       245.0 lb Date of Birth:  1965/12/22            BSA:          2.322 m Patient Age:    57 years             BP:           112/72 mmHg Patient Gender: M                    HR:           128 bpm. Exam Location:  [facility] Procedure: 2D Echo, Color Doppler, Cardiac Doppler and Intracardiac            Opacification Agent Indications:     R94.31 Abnormal ECG   History:         Patient has prior  history of Echocardiogram examinations, most                  recent 07/27/2019. Previous Myocardial Infarction, PAD and ESRD;                  Risk Factors:Sleep Apnea, Hypertension, Dyslipidemia and                  Diabetes. Pt tested positive for covid-19 in 07/2019.  Sonographer:     Charmayne Sheer RDCS (AE) Referring Phys:  [id number] Bradly Bienenstock Diagnosing Phys: Yolonda Kida MD  Sonographer Comments: Technically difficult study due to poor echo windows, no apical window and no subcostal window. Image acquisition challenging due to patient body habitus. IMPRESSIONS  1. Left ventricular ejection fraction, by estimation, is <20%. The left ventricle has severely decreased function. The left ventricle demonstrates global hypokinesis. The left ventricular internal cavity size was moderately to severely dilated. Left ventricular diastolic parameters are consistent with Grade II diastolic dysfunction (pseudonormalization).  2. Right ventricular systolic function is severely reduced. The right ventricular size is moderately enlarged. Moderately increased right ventricular wall thickness. There is severely elevated pulmonary artery systolic pressure.  3. Left atrial size was moderately dilated.  4. Right atrial size was moderately dilated.  5. The mitral valve is abnormal. Moderate mitral valve regurgitation.  6. Tricuspid valve regurgitation is moderate to severe.  7. The aortic valve is abnormal. Aortic valve regurgitation is not visualized. Mild aortic valve stenosis. FINDINGS  Left Ventricle: Left ventricular ejection fraction, by estimation, is <20%. The left ventricle has severely decreased function. The left ventricle demonstrates global hypokinesis. Definity contrast agent was given IV to delineate the left ventricular endocardial borders. The left ventricular internal cavity size was moderately to severely dilated. There is no left ventricular hypertrophy. Left ventricular  diastolic parameters are consistent with Grade II diastolic dysfunction (pseudonormalization). Right Ventricle: The right ventricular size is moderately enlarged. Moderately increased right ventricular wall thickness. Right ventricular systolic function is severely reduced. There is severely elevated pulmonary artery systolic pressure. The tricuspid regurgitant velocity is 3.60 m/s, and with an assumed right atrial pressure of 10 mmHg, the estimated right ventricular systolic pressure is 81.4 mmHg. Left Atrium: Left atrial size was moderately dilated. Right Atrium: Right atrial size was moderately dilated. Pericardium: There is no evidence of pericardial effusion. Mitral Valve: The mitral valve is abnormal. Moderate mitral valve regurgitation. MV peak gradient, 6.2 mmHg. The mean mitral valve gradient is 3.0 mmHg. Tricuspid Valve: The tricuspid valve is grossly normal. Tricuspid valve regurgitation is moderate to severe. Aortic Valve: The aortic valve is abnormal. . There is moderate thickening and moderate calcification of the aortic valve. Aortic valve regurgitation is not visualized. Mild aortic stenosis is present. Mild aortic valve annular calcification. There is moderate thickening of the aortic valve. There is moderate calcification of the aortic valve. Pulmonic Valve: The pulmonic valve was grossly normal. Pulmonic valve regurgitation is not visualized. Aorta: The aortic root is normal in size and structure. IAS/Shunts: No atrial level shunt detected by color flow Doppler.  LEFT VENTRICLE PLAX 2D LVIDd:         4.72 cm LVIDs:         4.52 cm LV PW:         1.43 cm LV IVS:        1.11 cm LVOT diam:     2.10 cm LV SV:         23 LV SV  Index:   10 LVOT Area:     3.46 cm  LEFT ATRIUM           Index LA diam:      6.30 cm 2.71 cm/m LA Vol (A4C): 82.8 ml 35.65 ml/m  AORTIC VALVE             PULMONIC VALVE LVOT Vmax:   49.50 cm/s  PV Vmax:       0.79 m/s LVOT Vmean:  34.000 cm/s PV Vmean:      51.400 cm/s LVOT  VTI:    0.066 m     PV VTI:        0.106 m                          PV Peak grad:  2.5 mmHg AORTA                    PV Mean grad:  1.0 mmHg Ao Root diam: 3.30 cm  MITRAL VALVE                TRICUSPID VALVE MV Area (PHT): 7.41 cm     TR Peak grad:   51.8 mmHg MV Peak grad:  6.2 mmHg     TR Vmax:        360.00 cm/s MV Mean grad:  3.0 mmHg MV Vmax:       1.25 m/s     SHUNTS MV Vmean:      81.1 cm/s    Systemic VTI:  0.07 m MV Decel Time: 102 msec     Systemic Diam: 2.10 cm MV E velocity: 110.33 cm/s MV A velocity: 16.00 cm/s MV E/A ratio:  6.90 Yolonda Kida MD Electronically signed by Yolonda Kida MD Signature Date/Time: 03/08/2020/5:38:26 PM    Final      Subjective:   Patient was seen and examined 03/10/2020, 8:50 AM Patient stable today. No acute distress.  No issues overnight Stable for discharge.  Discharge Exam:    Vitals:   03/09/20 2041 03/10/20 0420 03/10/20 0435 03/10/20 0744  BP: 115/72 101/83 128/78 125/78  Pulse: 67 65 70 68  Resp: 17 20 18 18   Temp: 98.9 F (37.2 C) 98 F (36.7 C) 97.8 F (36.6 C) 98 F (36.7 C)  TempSrc: Oral Oral Oral Oral  SpO2: 96% 94% 98% 97%  Weight:   114.2 kg   Height:        General: Pt lying comfortably in bed & appears in no obvious distress. Cardiovascular: S1 & S2 heard, RRR, S1/S2 +. No murmurs, rubs, gallops or clicks. No JVD or pedal edema. Respiratory: Clear to auscultation without wheezing, rhonchi or crackles. No increased work of breathing. Abdominal:  Non-distended, non-tender & soft. No organomegaly or masses appreciated. Normal bowel sounds heard. CNS: Alert and oriented. No focal deficits. Extremities: no edema, no cyanosis    The results of significant diagnostics from this hospitalization (including imaging, microbiology, ancillary and laboratory) are listed below for reference.      Microbiology:   Recent Results (from the past 240 hour(s))  SARS Coronavirus 2 by RT PCR (hospital order, performed in [facility] hospital lab) Nasopharyngeal Nasopharyngeal Swab     Status: None   Collection Time: 03/03/20 11:02 AM   Specimen: Nasopharyngeal Swab  Result Value Ref Range Status   SARS Coronavirus 2 NEGATIVE NEGATIVE Final    Comment: (NOTE) SARS-CoV-2 target nucleic acids are NOT  DETECTED.  The SARS-CoV-2 RNA is generally detectable in upper and lower respiratory specimens during the acute phase of infection. The lowest concentration of SARS-CoV-2 viral copies this assay can detect is 250 copies / mL. A negative result does not preclude SARS-CoV-2 infection and should not be used as the sole basis for treatment or other patient management decisions.  A negative result may occur with improper specimen collection / handling, submission of specimen other than nasopharyngeal swab, presence of viral mutation(s) within the areas targeted by this assay, and inadequate number of viral copies (<250 copies / mL). A negative result must be combined with clinical observations, patient history, and epidemiological information.  Fact Sheet for Patients:   StrictlyIdeas.no  Fact Sheet for Healthcare Providers: BankingDealers.co.za  This test is not yet approved or  cleared by the [place] FDA and has been authorized for detection and/or diagnosis of SARS-CoV-2 by FDA under an Emergency Use Authorization (EUA).  This EUA will remain in effect (meaning this test can be used) for the duration of the COVID-19 declaration under Section 564(b)(1) of the Act, 21 U.S.C. section 360bbb-3(b)(1), unless the authorization is terminated or revoked sooner.  Performed at [facility], [address] [place], [place] [address]   SARS Coronavirus 2 by RT PCR (hospital order, performed in [facility] hospital lab) Nasopharyngeal Nasopharyngeal Swab     Status: None   Collection Time: 03/07/20  5:36 AM   Specimen: Nasopharyngeal Swab  Result Value Ref  Range Status   SARS Coronavirus 2 NEGATIVE NEGATIVE Final    Comment: (NOTE) SARS-CoV-2 target nucleic acids are NOT DETECTED.  The SARS-CoV-2 RNA is generally detectable in upper and lower respiratory specimens during the acute phase of infection. The lowest concentration of SARS-CoV-2 viral copies this assay can detect is 250 copies / mL. A negative result does not preclude SARS-CoV-2 infection and should not be used as the sole basis for treatment or other patient management decisions.  A negative result may occur with improper specimen collection / handling, submission of specimen other than nasopharyngeal swab, presence of viral mutation(s) within the areas targeted by this assay, and inadequate number of viral copies (<250 copies / mL). A negative result must be combined with clinical observations, patient history, and epidemiological information.  Fact Sheet for Patients:   StrictlyIdeas.no  Fact Sheet for Healthcare Providers: BankingDealers.co.za  This test is not yet approved or  cleared by the [place] FDA and has been authorized for detection and/or diagnosis of SARS-CoV-2 by FDA under an Emergency Use Authorization (EUA).  This EUA will remain in effect (meaning this test can be used) for the duration of the COVID-19 declaration under Section 564(b)(1) of the Act, 21 U.S.C. section 360bbb-3(b)(1), unless the authorization is terminated or revoked sooner.  Performed at [facility], [address] [place], [place] [address]   CULTURE, BLOOD (ROUTINE X 2) w Reflex to ID Panel     Status: None (Preliminary result)   Collection Time: 03/07/20  8:05 AM   Specimen: BLOOD  Result Value Ref Range Status   Specimen Description BLOOD LEFT FOREARM  Final   Special Requests   Final    BOTTLES DRAWN AEROBIC AND ANAEROBIC Blood Culture adequate volume   Culture   Final    NO GROWTH 3 DAYS Performed at [facility], [address] [place], [place] [address]    Report Status PENDING  Incomplete  CULTURE, BLOOD (ROUTINE X 2) w Reflex to ID  Panel     Status: None (Preliminary result)   Collection Time: 03/07/20  8:05 AM   Specimen: BLOOD  Result Value Ref Range Status   Specimen Description BLOOD LEFT POSTERIOR SRM  Final   Special Requests   Final    BOTTLES DRAWN AEROBIC AND ANAEROBIC Blood Culture adequate volume   Culture   Final    NO GROWTH 3 DAYS Performed at [facility], [address] [place], [place] [address]    Report Status PENDING  Incomplete  MRSA PCR Screening     Status: None   Collection Time: 03/07/20 12:17 PM   Specimen: Nasal Mucosa; Nasopharyngeal  Result Value Ref Range Status   MRSA by PCR NEGATIVE NEGATIVE Final    Comment:        The GeneXpert MRSA Assay (FDA approved for NASAL specimens only), is one component of a comprehensive MRSA colonization surveillance program. It is not intended to diagnose MRSA infection nor to guide or monitor treatment for MRSA infections. Performed at [facility], [address] [place],  [address]      Labs:   CBC: Recent Labs  Lab 03/07/20 0536 03/08/20 0241 03/09/20 0437 03/10/20 0440  WBC 11.7* 17.5* 13.5* 13.5*  NEUTROABS 8.9* 14.2* 11.2* 11.3*  HGB 11.3* 11.8* 10.7* 10.8*  HCT 36.9* 38.0* 34.6* 34.2*  MCV 95.3 94.1 93.5 91.4  PLT 190 182 178 650   Basic Metabolic Panel: Recent Labs  Lab 03/07/20 0536 03/07/20 0805 03/07/20 1217 03/07/20 1910 03/07/20 2235 03/08/20 0241 03/09/20 0437 03/10/20 0440  NA 125*  --    < > 133* 132* 129* 133* 129*  K 4.5  --    < > 4.3 4.3 4.4 4.2 4.2  CL 83*  --    < > 89* 88* 89* 91* 88*  CO2 26  --    < > 27 25 22 27 28   GLUCOSE 792*  --    < > 160* 87 122* 273* 114*  BUN 41*  --    < > 47* 49* 51* 47* 62*  CREATININE 5.26*  --    < > 5.63* 6.20* 5.98* 5.52* 6.98*  CALCIUM 8.2*  --    < > 8.5* 8.9 8.7* 8.0* 8.0*  MG 2.4  --    --   --   --  2.6* 2.2 2.3  PHOS  --  3.3  --   --   --  4.2 3.7 3.4   < > = values in this interval not displayed.   Liver Function Tests: Recent Labs  Lab 03/07/20 0536 03/08/20 0241 03/09/20 0437 03/10/20 0440  AST 20  --   --   --   ALT 37  --   --   --   ALKPHOS 169*  --   --   --   BILITOT 1.1  --   --   --   PROT 6.1*  --   --   --   ALBUMIN 3.2* 3.0* 2.7* 2.8*   BNP (last 3 results) Recent Labs    07/26/19 1959 03/07/20 0536  BNP 1,855.0* 3,819.1*   Cardiac Enzymes: No results for input(s): CKTOTAL, CKMB, CKMBINDEX, TROPONINI in the last 168 hours. CBG: Recent Labs  Lab 03/09/20 0754 03/09/20 1201 03/09/20 1645 03/09/20 2046 03/10/20 0741  GLUCAP 250* 209* 125* 96 134*   Hgb A1c No results for input(s): HGBA1C in the last 72 hours. Lipid Profile No results for input(s): CHOL, HDL, LDLCALC,  TRIG, CHOLHDL, LDLDIRECT in the last 72 hours. Thyroid function studies No results for input(s): TSH, T4TOTAL, T3FREE, THYROIDAB in the last 72 hours.  Invalid input(s): FREET3 Anemia work up No results for input(s): VITAMINB12, FOLATE, FERRITIN, TIBC, IRON, RETICCTPCT in the last 72 hours. Urinalysis No results found for: COLORURINE, APPEARANCEUR, LABSPEC, PHURINE, GLUCOSEU, HGBUR, BILIRUBINUR, KETONESUR, PROTEINUR, UROBILINOGEN, NITRITE, LEUKOCYTESUR Pressure Injury 03/07/20 Coccyx Mid Stage 1 -  Intact skin with non-blanchable redness of a localized area usually over a bony prominence. (Active)  03/07/20 1229  Location: Coccyx  Location Orientation: Mid  Staging: Stage 1 -  Intact skin with non-blanchable redness of a localized area usually over a bony prominence.  Wound Description (Comments):   Present on Admission:        Time coordinating discharge: Over 45 minutes  SIGNED: Deatra James, MD, FACP, [facility]. [facility],  Please use [email or web address] to Page If 7PM-7AM, please contact night-coverage [email or web address] [place] 03/10/2020, 8:50 AM

## 2020-03-10 NOTE — Progress Notes (Signed)
This note also relates to the following rows which could not be included: Pulse Rate - Cannot attach notes to unvalidated device data Resp - Cannot attach notes to unvalidated device data BP - Cannot attach notes to unvalidated device data  Hd completed  

## 2020-03-10 NOTE — Progress Notes (Signed)
Hd started  

## 2020-03-10 NOTE — Progress Notes (Signed)
Mobility Specialist - Progress Note   03/10/20 1407  Mobility  Activity Refused mobility  Mobility performed by Mobility specialist    Pt is still receiving HD treatment. Will attempt at a more appropriate time.    Kathee Delton Mobility Specialist 03/10/20, 2:08 PM

## 2020-03-10 NOTE — Plan of Care (Signed)
  Problem: Education: Goal: Knowledge of General Education information will improve Description: Including pain rating scale, medication(s)/side effects and non-pharmacologic comfort measures Outcome: Progressing   Problem: Clinical Measurements: Goal: Respiratory complications will improve Outcome: Progressing Goal: Cardiovascular complication will be avoided Outcome: Progressing   Problem: Skin Integrity: Goal: Risk for impaired skin integrity will decrease Outcome: Progressing

## 2020-03-11 ENCOUNTER — Telehealth: Payer: Self-pay | Admitting: Internal Medicine

## 2020-03-12 LAB — CULTURE, BLOOD (ROUTINE X 2)
Culture: NO GROWTH
Culture: NO GROWTH
Special Requests: ADEQUATE
Special Requests: ADEQUATE

## 2020-03-13 ENCOUNTER — Telehealth: Payer: Self-pay

## 2020-03-13 NOTE — Telephone Encounter (Signed)
Ralph Peterson from [employer] called stated that they are trying to get an appeal for a denial and request a letter of medical nessecity.  He would like for you to give him a call to discuss.  [phone] BCW: [phone]

## 2020-03-15 ENCOUNTER — Ambulatory Visit: Payer: Medicare PPO | Admitting: Family

## 2020-03-16 DIAGNOSIS — I350 Nonrheumatic aortic (valve) stenosis: Secondary | ICD-10-CM | POA: Insufficient documentation

## 2020-03-20 DIAGNOSIS — J189 Pneumonia, unspecified organism: Secondary | ICD-10-CM | POA: Insufficient documentation

## 2020-03-21 ENCOUNTER — Ambulatory Visit: Payer: Self-pay | Admitting: Podiatry

## 2020-03-22 ENCOUNTER — Encounter (INDEPENDENT_AMBULATORY_CARE_PROVIDER_SITE_OTHER): Payer: Medicare PPO

## 2020-03-22 ENCOUNTER — Ambulatory Visit (INDEPENDENT_AMBULATORY_CARE_PROVIDER_SITE_OTHER): Payer: Medicare PPO | Admitting: Nurse Practitioner

## 2020-03-28 DIAGNOSIS — I1311 Hypertensive heart and chronic kidney disease without heart failure, with stage 5 chronic kidney disease, or end stage renal disease: Secondary | ICD-10-CM | POA: Insufficient documentation

## 2020-04-04 ENCOUNTER — Encounter: Payer: Self-pay | Admitting: Podiatry

## 2020-04-04 ENCOUNTER — Ambulatory Visit: Payer: Medicare PPO | Admitting: Podiatry

## 2020-04-04 ENCOUNTER — Other Ambulatory Visit: Payer: Self-pay

## 2020-04-04 ENCOUNTER — Ambulatory Visit (INDEPENDENT_AMBULATORY_CARE_PROVIDER_SITE_OTHER): Payer: Medicare PPO

## 2020-04-04 DIAGNOSIS — E0842 Diabetes mellitus due to underlying condition with diabetic polyneuropathy: Secondary | ICD-10-CM

## 2020-04-04 DIAGNOSIS — Z01818 Encounter for other preprocedural examination: Secondary | ICD-10-CM

## 2020-04-04 DIAGNOSIS — L97515 Non-pressure chronic ulcer of other part of right foot with muscle involvement without evidence of necrosis: Secondary | ICD-10-CM | POA: Diagnosis not present

## 2020-04-04 DIAGNOSIS — M86071 Acute hematogenous osteomyelitis, right ankle and foot: Secondary | ICD-10-CM | POA: Diagnosis not present

## 2020-04-04 NOTE — Patient Instructions (Signed)
Pre-Operative Instructions  Congratulations, you have decided to take an important step towards improving your quality of life.  You can be assured that the doctors and staff at [facility] will be with you every step of the way.  Here are some important things you should know:  1. Plan to be at the surgery center/hospital at least 1 (one) hour prior to your scheduled time, unless otherwise directed by the surgical center/hospital staff.  You must have a responsible adult accompany you, remain during the surgery and drive you home.  Make sure you have directions to the surgical center/hospital to ensure you arrive on time. 2. If you are having surgery at [facility] or [facility], you will need a copy of your medical history and physical form from your family physician within one month prior to the date of surgery. We will give you a form for your primary physician to complete.  3. We make every effort to accommodate the date you request for surgery.  However, there are times where surgery dates or times have to be moved.  We will contact you as soon as possible if a change in schedule is required.   4. No aspirin/ibuprofen for one week before surgery.  If you are on aspirin, any non-steroidal anti-inflammatory medications (Mobic, Aleve, Ibuprofen) should not be taken seven (7) days prior to your surgery.  You make take Tylenol for pain prior to surgery.  5. Medications - If you are taking daily heart and blood pressure medications, seizure, reflux, allergy, asthma, anxiety, pain or diabetes medications, make sure you notify the surgery center/hospital before the day of surgery so they can tell you which medications you should take or avoid the day of surgery. 6. No food or drink after midnight the night before surgery unless directed otherwise by surgical center/hospital staff. 7. No alcoholic beverages 24-hours prior to surgery.  No smoking 24-hours prior or 24-hours after  surgery. 8. Wear loose pants or shorts. They should be loose enough to fit over bandages, boots, and casts. 9. Don't wear slip-on shoes. Sneakers are preferred. 10. Bring your boot with you to the surgery center/hospital.  Also bring crutches or a walker if your physician has prescribed it for you.  If you do not have this equipment, it will be provided for you after surgery. 11. If you have not been contacted by the surgery center/hospital by the day before your surgery, call to confirm the date and time of your surgery. 12. Leave-time from work may vary depending on the type of surgery you have.  Appropriate arrangements should be made prior to surgery with your employer. 13. Prescriptions will be provided immediately following surgery by your doctor.  Fill these as soon as possible after surgery and take the medication as directed. Pain medications will not be refilled on weekends and must be approved by the doctor. 14. Remove nail polish on the operative foot and avoid getting pedicures prior to surgery. 15. Wash the night before surgery.  The night before surgery wash the foot and leg well with water and the antibacterial soap provided. Be sure to pay special attention to beneath the toenails and in between the toes.  Wash for at least three (3) minutes. Rinse thoroughly with water and dry well with a towel.  Perform this wash unless told not to do so by your physician.  Enclosed: 1 Ice pack (please put in freezer the night before surgery)   1 Hibiclens skin cleaner     Pre-op instructions  If you have any questions regarding the instructions, please do not hesitate to call our office.  [place]: [address], [place], [place] [address] -- [phone]  [place]: [address] [place], [place] [address] -- [phone]  [place]: [address], [place], [place] [address] -- [phone]   Website: [email or web address] 

## 2020-04-05 ENCOUNTER — Encounter: Payer: Self-pay | Admitting: Podiatry

## 2020-04-05 NOTE — Progress Notes (Signed)
Subjective:  Patient ID: Ralph Peterson, male    DOB: 1965-07-27,  MRN: [id number]  Chief Complaint  Patient presents with  . Foot Ulcer    wound care   No changes    54 y.o. male presents for wound care.  Patient presents with right submetatarsal 2 ulceration that continues to progress worse.  Patient was further admitted to the hospital for systemic issues.  Patient states the ulcer has gotten worse.  He states that it is more painful.  He denies any other acute complaints he has been doing regular dressing changes.  Review of Systems: Negative except as noted in the HPI. Denies N/V/F/Ch.  Past Medical History:  Diagnosis Date  . CAD (coronary artery disease)    a. 09/2010 Cath/PCI ([facility]): LM nl, LAD 47m, D1 80 (small), LCX 70m, OM1 30, RI 70 (small), RCA 70 (DES).  . Coronary artery disease   . COVID-19 virus infection 07/2019  . Diabetes mellitus without complication ([place])   . ESRD (end stage renal disease) ([place])   . Hyperlipidemia   . Hypertension   . Ischemic cardiomyopathy    a.  12/2011 Echo ([facility]): NL EF, mod LVH. Mild AS/MS, triv PR/TR. 07/2019 Echo: EF 25-30%, GR1 DD, inf/post HK, low nl RV fxn, mildly dil RA, triv MR, mild Ao sclerosis w/o stenosis.  . NSTEMI (non-ST elevated myocardial infarction) ([place]) 07/2019  . PAD (peripheral artery disease) ([place])    a. 03/2016 s/p PTA/DEB R SFA/popliteal/peroneal; b. 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 08/2017 Atherectomy/DEB dist L SFA/popliteal. PTA of L AT; d. 06/2018 PTA/DEB L SFA/popliteal/PT/AT; e. 01/2019 Stable ABIs.  . Sleep apnea     Current Outpatient Medications:  .  metoprolol succinate (TOPROL-XL) 50 MG 24 hr tablet, , Disp: , Rfl:  .  albuterol (VENTOLIN HFA) 108 (90 Base) MCG/ACT inhaler, Inhale 1-2 puffs into the lungs every 6 (six) hours as needed for wheezing or shortness of breath. , Disp: , Rfl:  .  apixaban (ELIQUIS) 5 MG TABS tablet, Take 1 tablet (5 mg total) by mouth 2 (two) times daily., Disp: 60 tablet,  Rfl: 3 .  atorvastatin (LIPITOR) 40 MG tablet, Take 40 mg by mouth every evening. , Disp: , Rfl: 1 .  cinacalcet (SENSIPAR) 30 MG tablet, Take 30 mg by mouth daily., Disp: , Rfl:  .  escitalopram (LEXAPRO) 20 MG tablet, Take 20 mg by mouth daily. , Disp: , Rfl:  .  glipiZIDE (GLUCOTROL) 5 MG tablet, Take 5 mg by mouth daily before breakfast. , Disp: , Rfl:  .  insulin detemir (LEVEMIR) 100 UNIT/ML injection, Inject 0.05 mLs (5 Units total) into the skin 2 (two) times daily., Disp: 10 mL, Rfl: 3 .  montelukast (SINGULAIR) 10 MG tablet, Take 10 mg by mouth daily., Disp: , Rfl:  .  predniSONE (DELTASONE) 10 MG tablet, Day 1-2: Take 50mg (5 pills) Day 3-4: Take 40mg (4) Day 5-6: 30mg (3) Day 7-8: 20mg (2) Day 9:10mg (1, Disp: 29 tablet, Rfl: 0 .  SANTYL ointment, , Disp: , Rfl:  .  sevelamer carbonate (RENVELA) 800 MG tablet, Take 1,600-3,200 mg by mouth See admin instructions. Take 4 tablets (3200mg ) three times daily with meals and take 1 tablet (800mg ) by mouth with snacks, Disp: , Rfl:   Social History   Tobacco Use  Smoking Status Former Smoker  . Types: Cigarettes  . Quit date: 2010  . Years since quitting: 11.7  Smokeless Tobacco Never Used    Allergies  Allergen Reactions  .  Penicillins Rash    Has patient had a PCN reaction causing immediate rash, facial/tongue/throat swelling, SOB or lightheadedness with hypotension: Yes Has patient had a PCN reaction causing severe rash involving mucus membranes or skin necrosis: Yes Has patient had a PCN reaction that required hospitalization: No Has patient had a PCN reaction occurring within the last 10 years: No If all of the above answers are "NO", then may proceed with Cephalosporin use.    Objective:  There were no vitals filed for this visit. There is no height or weight on file to calculate BMI. Constitutional Well developed. Well nourished.  Vascular Dorsalis pedis pulses non palpable bilaterally. Posterior tibial pulsesnon palpable  bilaterally. Capillary refill normal to all digits.  No cyanosis or clubbing noted. Pedal hair growth normal.  Neurologic Normal speech. Oriented to person, place, and time. Protective sensation absent  Dermatologic Wound Location: Right submetatarsal 2 ulcer with probing down to deep tissue/muscle.  Does not probe down to bone.  No clinical signs of infection noted.  No purulent drainage was expressed.  No malodor present. Wound Base: Mixed Granular/Fibrotic Peri-wound: Calloused Exudate: Scant/small amount Serosanguinous exudate Wound Measurements: -See below  Orthopedic: No pain to palpation either foot.   Radiographs: 2 views of skeletally mature adult right foot: Osteomyelitic changes noted to the second digit without any soft tissue emphysema.  Assessment:   1. Ulcer of right foot with muscle involvement without evidence of necrosis ([place])   2. Diabetes mellitus due to underlying condition with diabetic polyneuropathy, unspecified whether long term insulin use ([place])   3. Acute hematogenous osteomyelitis of right foot ([place])    Plan:  Patient was evaluated and treated and all questions answered.  Ulcer right submetatarsal 2 ulceration with probing down to deep tissue/capsule/muscle~stagnant -Debridement as below. -Dressed with Betadine wet-to-dry, DSD. -Continue off-loading with surgical shoe. -Surgical shoe was dispensed -I discussed with the patient that given that the location of the ulceration patient a high risk of losing that digit as well as then further amputation.  I am worried that patient may end up losing her metatarsal head if the wound continues to progress the way it is.  Patient will continue to offload her right away. -Patient underwent vascular angiogram with intervention and had multiple stents placed to the right lower extremity.  Based on previous vascular noted appears that patient has the most dominant flow via the peroneal.  There is multiple blockages in  the anterior tibial.  And patient does not have adequate supply from the posterior tibial at all. -Patient scheduled to follow-up with vascular and I would like for them to see if there is any further intervention to open the arteries.  Patient still does not have enough bleeding as when I attempted to debridement there is not much bleeding associated with it. -Continue doxycycline -Given that the wound has considerably regressed with underlying osteomyelitis I believe patient will benefit from surgical amputation with partial second ray.  He will be scheduled at the surgery center for amputation of the right partial ray.  He is a high risk of losing the entire foot given that his A1c is 8.5 with questionable flow to the right lower extremity.  However at this time I will monitor the Intra-Op bleeding while at the same time he continues to follow-up with vascular. -Informed surgical risk consent was reviewed and read aloud to the patient.  I reviewed the films.  I have discussed my findings with the patient in great detail.  I  have discussed all risks including but not limited to infection, stiffness, scarring, limp, disability, deformity, damage to blood vessels and nerves, numbness, poor healing, need for braces, arthritis, chronic pain, amputation, death.  All benefits and realistic expectations discussed in great detail.  I have made no promises as to the outcome.  I have provided realistic expectations.  I have offered the patient a 2nd opinion, which they have declined and assured me they preferred to proceed despite the risks -A total of 33 minutes was spent in direct patient care as well as pre and post patient encounter activities.  This includes documentation as well as reviewing patient chart for labs, imaging, past medical, surgical, social, and family history as documented in the EMR.  I have reviewed medication allergies as documented in EMR.  I discussed the etiology of condition and treatment  options from conservative to surgical care.  All risks and benefit of the treatment course was discussed in detail.  All questions were answered and return appointment was discussed.  Since the visit completed in an ambulatory/outpatient setting, the patient and/or parent/guardian has been advised to contact the providers office for worsening condition and seek medical treatment and/or call 911 if the patient deems either is necessary.   Procedure: Excisional Debridement of Wound~stagnant Tool: Sharp chisel blade/tissue nipper Rationale: Removal of non-viable soft tissue from the wound to promote healing.  Anesthesia: none Pre-Debridement Wound Measurements: 1.5 cm x 1.4 cm x 0.6 cm  Post-Debridement Wound Measurements: 1.7 cm x 1.4 cm x 0.6 cm  Type of Debridement: Sharp Excisional Tissue Removed: Non-viable soft tissue Blood loss: Minimal (<50cc) Depth of Debridement: subcutaneous tissue. Technique: Sharp excisional debridement to bleeding, viable wound base.  Wound Progress: The wound is staying the same.  It is currently stagnation. Site healing conversation 7 Dressing: Dry, sterile, compression dressing. Disposition: Patient tolerated procedure well. Patient to return in 1 week for follow-up.  No follow-ups on file.

## 2020-04-10 DIAGNOSIS — M86671 Other chronic osteomyelitis, right ankle and foot: Secondary | ICD-10-CM

## 2020-04-10 MED ORDER — OXYCODONE-ACETAMINOPHEN 10-325 MG PO TABS: 1.0000 | ORAL_TABLET | Freq: Four times a day (QID) | ORAL | 0 refills | Status: AC | PRN

## 2020-04-10 NOTE — Addendum Note (Signed)
Addended by: Boneta Lucks on: 04/10/2020 06:21 AM   Modules accepted: Orders

## 2020-04-14 ENCOUNTER — Encounter: Payer: Self-pay | Admitting: Podiatry

## 2020-04-17 NOTE — Progress Notes (Signed)
Patient ID: Ralph Peterson, male    DOB: 1965/09/13, 54 y.o.   MRN: [id number]  HPI  Ralph Peterson is a 54 y/o male with a history of atrial fibrillation, CAD, DM, hyperlipidemia, HTN, CKD, PAD, sleep apnea, anxiety, previous tobacco use and chronic HF.   Echo report from 03/07/20 reviewed and showed an EF of <20% along with severely elevated PA pressure, moderate LAE, moderate Ralph and moderate/ severe TR.   Admitted 03/07/20 due to acute on chronic HF along with new onset AF with RVR. Cardiology, nephrology and wound consults obtained. Initially placed on amiodarone and heparin drips then transitioned to oral medications. Elevated troponin thought to be due to demand ischemia. Discharged after 3 days.   He presents today for his initial visit with a chief complaint of moderate shortness of breath with moderate exertion. He describes this as having been present for the last 9 months or so. He has associated fatigue, productive cough, light-headedness, easy bruising and difficulty sleeping at times along with this. He denies any abdominal distention, palpitations, pedal edema or chest pain.   Does not weigh at home due to not having scales and gets weighed at dialysis three days / week. Was hospitalized with covid January 2021 and says that "all this respiratory stuff happened after that".   Past Medical History:  Diagnosis Date  . Anxiety   . Arrhythmia    atrial fibrillation  . CAD (coronary artery disease)    a. 09/2010 Cath/PCI ([facility]): LM nl, LAD 17m, D1 80 (small), LCX 39m, OM1 30, RI 70 (small), RCA 70 (DES).  . CHF (congestive heart failure) ([place])   . Coronary artery disease   . COVID-19 virus infection 07/2019  . Diabetes mellitus without complication ([place])   . ESRD (end stage renal disease) ([place])   . Hyperlipidemia   . Hypertension   . Ischemic cardiomyopathy    a.  12/2011 Echo ([facility]): NL EF, mod LVH. Mild AS/MS, triv PR/TR. 07/2019 Echo: EF 25-30%, GR1 DD, inf/post HK, low nl RV  fxn, mildly dil RA, triv Ralph, mild Ao sclerosis w/o stenosis.  . NSTEMI (non-ST elevated myocardial infarction) ([place]) 07/2019  . PAD (peripheral artery disease) ([place])    a. 03/2016 s/p PTA/DEB R SFA/popliteal/peroneal; b. 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 08/2017 Atherectomy/DEB dist L SFA/popliteal. PTA of L AT; d. 06/2018 PTA/DEB L SFA/popliteal/PT/AT; e. 01/2019 Stable ABIs.  . Sleep apnea    Past Surgical History:  Procedure Laterality Date  . ABDOMINAL AORTOGRAM N/A 09/10/2017   Procedure: ABDOMINAL AORTOGRAM;  Surgeon: Wellington Hampshire, MD;  Location: [place] [facility];  Service: Cardiovascular;  Laterality: N/A;  . AMPUTATION TOE Bilateral    one toe on right, all five toes on the left  . CARDIAC CATHETERIZATION  2012   [name]; X1 stent 2.5 x 33 mm xience stent Distal TCA  . LOWER EXTREMITY ANGIOGRAPHY Bilateral 09/10/2017   Procedure: Lower Extremity Angiography;  Surgeon: Wellington Hampshire, MD;  Location: [place] [facility];  Service: Cardiovascular;  Laterality: Bilateral;  . LOWER EXTREMITY ANGIOGRAPHY Left 06/23/2018   Procedure: LOWER EXTREMITY ANGIOGRAPHY;  Surgeon: Katha Cabal, MD;  Location: [place] [facility];  Service: Cardiovascular;  Laterality: Left;  . LOWER EXTREMITY ANGIOGRAPHY Right 02/01/2020   Procedure: LOWER EXTREMITY ANGIOGRAPHY;  Surgeon: Katha Cabal, MD;  Location: [place] [facility];  Service: Cardiovascular;  Laterality: Right;  . PERIPHERAL VASCULAR ATHERECTOMY Left 09/10/2017   Procedure: PERIPHERAL  VASCULAR ATHERECTOMY;  Surgeon: Wellington Hampshire, MD;  Location: [place] [facility];  Service: Cardiovascular;  Laterality: Left;  SFA/POPLITEAL  . PERIPHERAL VASCULAR BALLOON ANGIOPLASTY Left 09/10/2017   Procedure: PERIPHERAL VASCULAR BALLOON ANGIOPLASTY;  Surgeon: Wellington Hampshire, MD;  Location: [place] [facility];  Service: Cardiovascular;  Laterality: Left;  ANTERIAL TIBIAL  . PERIPHERAL VASCULAR CATHETERIZATION Right 03/26/2016    Procedure: Lower Extremity Angiography;  Surgeon: Katha Cabal, MD;  Location: [place] [facility];  Service: Cardiovascular;  Laterality: Right;  . PERIPHERAL VASCULAR INTERVENTION Left 09/10/2017   Procedure: PERIPHERAL VASCULAR INTERVENTION;  Surgeon: Wellington Hampshire, MD;  Location: [place] [facility];  Service: Cardiovascular;  Laterality: Left;  SFA/POPLITEAL   Family History  Problem Relation Age of Onset  . Hypertension Mother    Social History   Tobacco Use  . Smoking status: Former Smoker    Types: Cigarettes    Quit date: 2010    Years since quitting: 11.7  . Smokeless tobacco: Never Used  Substance Use Topics  . Alcohol use: Not Currently   Allergies  Allergen Reactions  . Penicillins Rash    Has patient had a PCN reaction causing immediate rash, facial/tongue/throat swelling, SOB or lightheadedness with hypotension: Yes Has patient had a PCN reaction causing severe rash involving mucus membranes or skin necrosis: Yes Has patient had a PCN reaction that required hospitalization: No Has patient had a PCN reaction occurring within the last 10 years: No If all of the above answers are "NO", then may proceed with Cephalosporin use.    Prior to Admission medications   Medication Sig Start Date End Date Taking? Authorizing Provider  amiodarone (PACERONE) 200 MG tablet Take 200 mg by mouth 2 (two) times daily.   Yes [provider]  apixaban (ELIQUIS) 5 MG TABS tablet Take 1 tablet (5 mg total) by mouth 2 (two) times daily. 03/10/20 04/18/20 Yes Shahmehdi, Seyed A, MD  atorvastatin (LIPITOR) 40 MG tablet Take 40 mg by mouth every evening.  03/31/18  Yes [provider]  cinacalcet (SENSIPAR) 30 MG tablet Take 30 mg by mouth daily.   Yes [provider]  escitalopram (LEXAPRO) 20 MG tablet Take 20 mg by mouth daily.    Yes [provider]  famotidine (PEPCID) 20 MG tablet Take 20 mg by mouth daily.   Yes [provider]   glipiZIDE (GLUCOTROL) 5 MG tablet Take 5 mg by mouth daily before breakfast.  01/01/19  Yes [provider]  insulin detemir (LEVEMIR) 100 UNIT/ML injection Inject 0.05 mLs (5 Units total) into the skin 2 (two) times daily. 03/10/20 04/18/20 Yes Shahmehdi, Valeria Batman, MD  loperamide (IMODIUM) 2 MG capsule Take 2 mg by mouth as needed for diarrhea or loose stools.   Yes [provider]  metoprolol succinate (TOPROL-XL) 50 MG 24 hr tablet  03/25/20  Yes [provider]  montelukast (SINGULAIR) 10 MG tablet Take 10 mg by mouth daily. 02/17/20  Yes [provider]  oxyCODONE-acetaminophen (PERCOCET) 10-325 MG tablet Take 1 tablet by mouth every 6 (six) hours as needed for up to 8 days for pain. 04/10/20 04/18/20 Yes Felipa Furnace, DPM  SANTYL ointment  03/25/20  Yes [provider]  sevelamer carbonate (RENVELA) 800 MG tablet Take 1,600-3,200 mg by mouth See admin instructions. Take 4 tablets (3200mg ) three times daily with meals and take 1 tablet (800mg ) by mouth with snacks   Yes [provider]    Review  of Systems  Constitutional: Positive for appetite change (fluctuating) and fatigue.  HENT: Positive for congestion and rhinorrhea.   Eyes: Negative.   Respiratory: Positive for cough (productive) and shortness of breath (with moderate exertion). Negative for chest tightness.   Cardiovascular: Negative for chest pain, palpitations and leg swelling.  Gastrointestinal: Negative for abdominal distention and abdominal pain.  Endocrine: Negative.   Genitourinary: Negative.        Does not make urine  Musculoskeletal: Negative for back pain and neck pain.  Skin: Positive for wound (left lower leg/ right foot).  Allergic/Immunologic: Negative.   Neurological: Positive for light-headedness (with sudden position changes). Negative for dizziness.  Hematological: Negative for adenopathy. Bruises/bleeds easily.  Psychiatric/Behavioral: Positive for sleep  disturbance (sleeping in recliner d/t comfort). Negative for dysphoric mood. The patient is not nervous/anxious.    Vitals:   04/18/20 0929  BP: 124/84  Pulse: 73  Resp: 18  SpO2: 96%  Weight: 214 lb 8 oz (97.3 kg)  Height: 6' (1.829 m)   Wt Readings from Last 3 Encounters:  04/18/20 214 lb 8 oz (97.3 kg)  03/10/20 251 lb 12.3 oz (114.2 kg)  03/03/20 245 lb (111.1 kg)   Lab Results  Component Value Date   CREATININE 6.98 (H) 03/10/2020   CREATININE 5.52 (H) 03/09/2020   CREATININE 5.98 (H) 03/08/2020     Physical Exam Vitals and nursing note reviewed.  Constitutional:      Appearance: He is well-developed.  HENT:     Head: Normocephalic and atraumatic.  Cardiovascular:     Rate and Rhythm: Normal rate and regular rhythm.  Pulmonary:     Effort: Pulmonary effort is normal. No respiratory distress.     Breath sounds: No wheezing or rales.  Abdominal:     Palpations: Abdomen is soft.     Tenderness: There is no abdominal tenderness.  Musculoskeletal:     Cervical back: Normal range of motion and neck supple.     Right lower leg: No tenderness. No edema.     Left lower leg: No tenderness. No edema.  Skin:    General: Skin is warm and dry.     Comments: Right foot with recent toe amputation and currently wrapped; left lower leg has 2 bandages where skin was weeping  Neurological:     General: No focal deficit present.     Mental Status: He is alert and oriented to person, place, and time.  Psychiatric:        Mood and Affect: Mood normal.        Behavior: Behavior normal.     Assessment & Plan:  1: Chronic heart failure with reduced ejection fraction- - NYHA class III - euvolemic today - scales given and he was instructed to weigh after morning, write the weight down and call for an overnight weight gain of >2 pounds or a weekly weight gain of >5 pounds; advised that his weight could fluctuate due to dialysis days versus non-dialysis days - not adding salt and  tries to read food labels for sodium content; already rinses off canned vegetables; reviewed the importance of keeping daily sodium intake to ~ 2000mg  / day; low sodium cookbook and dietary information given to patient and his son - drinking ~ 1L of fluids / day (per nephrology) - saw cardiology Nehemiah Massed) 04/03/20 - BNP 03/07/20 was 3819.1 - reports receiving both moderna covid vaccines - has not gotten his flu vaccine yet  2: HTN- - BP looks good today -  BMP September 2021 showed sodium 136, potassium 4.0, creatinine 6.55  3: DM- - A1c 02/02/20 was 7.3% - glucose in clinic today was 52  4: ESRD- - dialysis M,W,F - saw vascular ([facility]) 01/20/20   Medication bottles reviewed.   Return in 2 months or sooner for any questions/problems before then.

## 2020-04-18 ENCOUNTER — Encounter: Payer: Self-pay | Admitting: Podiatry

## 2020-04-18 ENCOUNTER — Ambulatory Visit (INDEPENDENT_AMBULATORY_CARE_PROVIDER_SITE_OTHER): Payer: Medicare PPO

## 2020-04-18 ENCOUNTER — Encounter: Payer: Self-pay | Admitting: Family

## 2020-04-18 ENCOUNTER — Ambulatory Visit: Payer: Medicare PPO | Attending: Family | Admitting: Family

## 2020-04-18 ENCOUNTER — Other Ambulatory Visit: Payer: Self-pay

## 2020-04-18 ENCOUNTER — Ambulatory Visit (INDEPENDENT_AMBULATORY_CARE_PROVIDER_SITE_OTHER): Payer: Medicare PPO | Admitting: Podiatry

## 2020-04-18 VITALS — BP 125/77 | HR 73 | Temp 98.5°F

## 2020-04-18 VITALS — BP 124/84 | HR 73 | Resp 18 | Ht 72.0 in | Wt 214.5 lb

## 2020-04-18 DIAGNOSIS — I132 Hypertensive heart and chronic kidney disease with heart failure and with stage 5 chronic kidney disease, or end stage renal disease: Secondary | ICD-10-CM | POA: Insufficient documentation

## 2020-04-18 DIAGNOSIS — Z992 Dependence on renal dialysis: Secondary | ICD-10-CM | POA: Diagnosis not present

## 2020-04-18 DIAGNOSIS — L97512 Non-pressure chronic ulcer of other part of right foot with fat layer exposed: Secondary | ICD-10-CM | POA: Diagnosis not present

## 2020-04-18 DIAGNOSIS — Z89421 Acquired absence of other right toe(s): Secondary | ICD-10-CM

## 2020-04-18 DIAGNOSIS — Z794 Long term (current) use of insulin: Secondary | ICD-10-CM | POA: Diagnosis not present

## 2020-04-18 DIAGNOSIS — I5022 Chronic systolic (congestive) heart failure: Secondary | ICD-10-CM | POA: Insufficient documentation

## 2020-04-18 DIAGNOSIS — N186 End stage renal disease: Secondary | ICD-10-CM | POA: Insufficient documentation

## 2020-04-18 DIAGNOSIS — Z8249 Family history of ischemic heart disease and other diseases of the circulatory system: Secondary | ICD-10-CM | POA: Diagnosis not present

## 2020-04-18 DIAGNOSIS — R0602 Shortness of breath: Secondary | ICD-10-CM | POA: Insufficient documentation

## 2020-04-18 DIAGNOSIS — R05 Cough: Secondary | ICD-10-CM | POA: Insufficient documentation

## 2020-04-18 DIAGNOSIS — L97515 Non-pressure chronic ulcer of other part of right foot with muscle involvement without evidence of necrosis: Secondary | ICD-10-CM | POA: Diagnosis not present

## 2020-04-18 DIAGNOSIS — Z9889 Other specified postprocedural states: Secondary | ICD-10-CM

## 2020-04-18 DIAGNOSIS — F419 Anxiety disorder, unspecified: Secondary | ICD-10-CM | POA: Insufficient documentation

## 2020-04-18 DIAGNOSIS — E1151 Type 2 diabetes mellitus with diabetic peripheral angiopathy without gangrene: Secondary | ICD-10-CM | POA: Diagnosis not present

## 2020-04-18 DIAGNOSIS — E785 Hyperlipidemia, unspecified: Secondary | ICD-10-CM | POA: Diagnosis not present

## 2020-04-18 DIAGNOSIS — I2510 Atherosclerotic heart disease of native coronary artery without angina pectoris: Secondary | ICD-10-CM | POA: Diagnosis not present

## 2020-04-18 DIAGNOSIS — Z87891 Personal history of nicotine dependence: Secondary | ICD-10-CM | POA: Diagnosis not present

## 2020-04-18 DIAGNOSIS — G479 Sleep disorder, unspecified: Secondary | ICD-10-CM | POA: Insufficient documentation

## 2020-04-18 DIAGNOSIS — Z955 Presence of coronary angioplasty implant and graft: Secondary | ICD-10-CM | POA: Insufficient documentation

## 2020-04-18 DIAGNOSIS — Z7901 Long term (current) use of anticoagulants: Secondary | ICD-10-CM | POA: Insufficient documentation

## 2020-04-18 DIAGNOSIS — E1122 Type 2 diabetes mellitus with diabetic chronic kidney disease: Secondary | ICD-10-CM | POA: Insufficient documentation

## 2020-04-18 DIAGNOSIS — R42 Dizziness and giddiness: Secondary | ICD-10-CM | POA: Insufficient documentation

## 2020-04-18 DIAGNOSIS — I252 Old myocardial infarction: Secondary | ICD-10-CM | POA: Insufficient documentation

## 2020-04-18 DIAGNOSIS — Z79899 Other long term (current) drug therapy: Secondary | ICD-10-CM | POA: Diagnosis not present

## 2020-04-18 DIAGNOSIS — I255 Ischemic cardiomyopathy: Secondary | ICD-10-CM | POA: Insufficient documentation

## 2020-04-18 DIAGNOSIS — I10 Essential (primary) hypertension: Secondary | ICD-10-CM

## 2020-04-18 DIAGNOSIS — Z8616 Personal history of COVID-19: Secondary | ICD-10-CM | POA: Diagnosis not present

## 2020-04-18 LAB — GLUCOSE, CAPILLARY: Glucose-Capillary: 87 mg/dL (ref 70–99)

## 2020-04-18 NOTE — Patient Instructions (Addendum)
Begin weighing daily and call for an overnight weight gain of > 2 pounds or a weekly weight gain of >5 pounds. 

## 2020-04-19 ENCOUNTER — Encounter: Payer: Self-pay | Admitting: Podiatry

## 2020-04-19 DIAGNOSIS — L03116 Cellulitis of left lower limb: Secondary | ICD-10-CM | POA: Insufficient documentation

## 2020-04-19 NOTE — Progress Notes (Signed)
Subjective:  Patient ID: Ralph Peterson, male    DOB: 1966-07-21,  MRN: [id number]  Chief Complaint  Patient presents with  . Routine Post Op    POV #1 DOS 04/10/2020 RT SECOND METATARSAL/RAY AMPUTATION    54 y.o. male returns for post-op check.  Patient is doing well.  He has been keeping the bandages clean dry and intact.  Overall no acute complaints no clinical signs of infection.  Pain is well controlled.  Review of Systems: Negative except as noted in the HPI. Denies N/V/F/Ch.  Past Medical History:  Diagnosis Date  . Anxiety   . Arrhythmia    atrial fibrillation  . CAD (coronary artery disease)    a. 09/2010 Cath/PCI ([facility]): LM nl, LAD 70m, D1 80 (small), LCX 41m, OM1 30, RI 70 (small), RCA 70 (DES).  . CHF (congestive heart failure) ([place])   . Coronary artery disease   . COVID-19 virus infection 07/2019  . Diabetes mellitus without complication ([place])   . ESRD (end stage renal disease) ([place])   . Hyperlipidemia   . Hypertension   . Ischemic cardiomyopathy    a.  12/2011 Echo ([facility]): NL EF, mod LVH. Mild AS/MS, triv PR/TR. 07/2019 Echo: EF 25-30%, GR1 DD, inf/post HK, low nl RV fxn, mildly dil RA, triv MR, mild Ao sclerosis w/o stenosis.  . NSTEMI (non-ST elevated myocardial infarction) ([place]) 07/2019  . PAD (peripheral artery disease) ([place])    a. 03/2016 s/p PTA/DEB R SFA/popliteal/peroneal; b. 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 08/2017 Atherectomy/DEB dist L SFA/popliteal. PTA of L AT; d. 06/2018 PTA/DEB L SFA/popliteal/PT/AT; e. 01/2019 Stable ABIs.  . Sleep apnea     Current Outpatient Medications:  .  amiodarone (PACERONE) 200 MG tablet, Take 200 mg by mouth 2 (two) times daily., Disp: , Rfl:  .  apixaban (ELIQUIS) 5 MG TABS tablet, Take 1 tablet (5 mg total) by mouth 2 (two) times daily., Disp: 60 tablet, Rfl: 3 .  atorvastatin (LIPITOR) 40 MG tablet, Take 40 mg by mouth every evening. , Disp: , Rfl: 1 .  cinacalcet (SENSIPAR) 30 MG tablet, Take 30 mg by mouth daily.,  Disp: , Rfl:  .  escitalopram (LEXAPRO) 20 MG tablet, Take 20 mg by mouth daily. , Disp: , Rfl:  .  famotidine (PEPCID) 20 MG tablet, Take 20 mg by mouth daily., Disp: , Rfl:  .  glipiZIDE (GLUCOTROL) 5 MG tablet, Take 5 mg by mouth daily before breakfast. , Disp: , Rfl:  .  insulin detemir (LEVEMIR) 100 UNIT/ML injection, Inject 0.05 mLs (5 Units total) into the skin 2 (two) times daily., Disp: 10 mL, Rfl: 3 .  loperamide (IMODIUM) 2 MG capsule, Take 2 mg by mouth as needed for diarrhea or loose stools., Disp: , Rfl:  .  metoprolol succinate (TOPROL-XL) 50 MG 24 hr tablet, , Disp: , Rfl:  .  montelukast (SINGULAIR) 10 MG tablet, Take 10 mg by mouth daily., Disp: , Rfl:  .  SANTYL ointment, , Disp: , Rfl:  .  sevelamer carbonate (RENVELA) 800 MG tablet, Take 1,600-3,200 mg by mouth See admin instructions. Take 4 tablets (3200mg ) three times daily with meals and take 1 tablet (800mg ) by mouth with snacks, Disp: , Rfl:   Social History   Tobacco Use  Smoking Status Former Smoker  . Types: Cigarettes  . Quit date: 2010  . Years since quitting: 11.7  Smokeless Tobacco Never Used    Allergies  Allergen Reactions  . Penicillins  Rash    Has patient had a PCN reaction causing immediate rash, facial/tongue/throat swelling, SOB or lightheadedness with hypotension: Yes Has patient had a PCN reaction causing severe rash involving mucus membranes or skin necrosis: Yes Has patient had a PCN reaction that required hospitalization: No Has patient had a PCN reaction occurring within the last 10 years: No If all of the above answers are "NO", then may proceed with Cephalosporin use.    Objective:   Vitals:   04/18/20 1355  BP: 125/77  Pulse: 73  Temp: 98.5 F (36.9 C)   There is no height or weight on file to calculate BMI. Constitutional Well developed. Well nourished.  Vascular Foot warm and well perfused. Capillary refill normal to all digits.   Neurologic Normal speech. Oriented to  person, place, and time. Epicritic sensation to light touch grossly present bilaterally.  Dermatologic Skin healing well without signs of infection. Skin edges well coapted without signs of infection.  Fibrogranular ulcer noted to the right lateral foot no exposure of bone.  Periwound is within normal limits.  No clinical signs of infection noted.  No purulent drainage noted.  Orthopedic: Tenderness to palpation noted about the surgical site.   Radiographs: 3 views of skeletally mature the right foot: Sharp surgical margins noted at the amputation site of the second ray.  No radiographic signs of osteomyelitis noted at this time.  However I am concerned for possible osteomyelitis to the right lateral fifth metatarsal head Assessment:   1. 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 S/P foot surgery, right   3. Right foot ulcer, with fat layer exposed ([place])    Plan:  Patient was evaluated and treated and all questions answered.  S/p foot surgery right -Progressing as expected post-operatively. -XR: See above -WB Status: Weightbearing as tolerated in Darco wedge shoe -Sutures: Intact.  No clinical signs of dehiscence noted.  No clinical signs of infection noted. -Medications: None -Foot redressed with Betadine wet-to-dry dressing  Right lateral foot ulcer with fat layer exposed -Patient the etiology of ulceration versus treatment options were discussed.  I believe patient will benefit from aggressive debridement of the ulcer with application of Santyl.  Patient states understanding -After reviewing the radiographs I am worried that patient may have possible osteomyelitis of the fifth metatarsal head.  Given that he has other ray amputation he is at high risk of undergoing transmetatarsal amputation at this time. -He will benefit from wound care center management for advancement care.  I will send a referral over time to [place] wound care center.  Procedure:  Excisional Debridement of Wound right lateral foot Tool: Sharp chisel blade/tissue nipper Rationale: Removal of non-viable soft tissue from the wound to promote healing.  Anesthesia: none Pre-Debridement Wound Measurements: 2 cm x 1.5 cm x 0.3 cm  Post-Debridement Wound Measurements: 2.2 cm x 1.7 cm x 0.3 cm  Type of Debridement: Sharp Excisional Tissue Removed: Non-viable soft tissue Blood loss: Minimal (<50cc) Depth of Debridement: subcutaneous tissue. Technique: Sharp excisional debridement to bleeding, viable wound base.  Wound Progress: This is my initial encounter for this.  I will continue to monitor the progression of it. Site healing conversation 7 Dressing: Dry, sterile, compression dressing. Disposition: Patient tolerated procedure well. Patient to return in 1 week for follow-up.  No follow-ups on file.     No follow-ups on file.

## 2020-04-27 ENCOUNTER — Other Ambulatory Visit: Payer: Self-pay

## 2020-04-27 ENCOUNTER — Encounter: Payer: Medicare PPO | Admitting: Podiatry

## 2020-04-27 ENCOUNTER — Encounter: Payer: Medicare PPO | Attending: Physician Assistant | Admitting: Physician Assistant

## 2020-04-27 DIAGNOSIS — Z89421 Acquired absence of other right toe(s): Secondary | ICD-10-CM | POA: Diagnosis not present

## 2020-04-27 DIAGNOSIS — T8131XA Disruption of external operation (surgical) wound, not elsewhere classified, initial encounter: Secondary | ICD-10-CM | POA: Diagnosis not present

## 2020-04-27 DIAGNOSIS — L97522 Non-pressure chronic ulcer of other part of left foot with fat layer exposed: Secondary | ICD-10-CM | POA: Insufficient documentation

## 2020-04-27 DIAGNOSIS — Z992 Dependence on renal dialysis: Secondary | ICD-10-CM | POA: Insufficient documentation

## 2020-04-27 DIAGNOSIS — E11621 Type 2 diabetes mellitus with foot ulcer: Secondary | ICD-10-CM | POA: Insufficient documentation

## 2020-04-27 DIAGNOSIS — I132 Hypertensive heart and chronic kidney disease with heart failure and with stage 5 chronic kidney disease, or end stage renal disease: Secondary | ICD-10-CM | POA: Insufficient documentation

## 2020-04-27 DIAGNOSIS — E1122 Type 2 diabetes mellitus with diabetic chronic kidney disease: Secondary | ICD-10-CM | POA: Insufficient documentation

## 2020-04-27 DIAGNOSIS — N186 End stage renal disease: Secondary | ICD-10-CM | POA: Insufficient documentation

## 2020-04-27 DIAGNOSIS — I5042 Chronic combined systolic (congestive) and diastolic (congestive) heart failure: Secondary | ICD-10-CM | POA: Insufficient documentation

## 2020-04-27 DIAGNOSIS — L89893 Pressure ulcer of other site, stage 3: Secondary | ICD-10-CM | POA: Insufficient documentation

## 2020-04-27 DIAGNOSIS — L97512 Non-pressure chronic ulcer of other part of right foot with fat layer exposed: Secondary | ICD-10-CM | POA: Diagnosis not present

## 2020-04-28 ENCOUNTER — Telehealth: Payer: Self-pay | Admitting: Podiatry

## 2020-04-28 NOTE — Telephone Encounter (Signed)
We can give a script to get the wheelchair. He will just need to come pick it up. I can write and have it ready

## 2020-04-28 NOTE — Telephone Encounter (Signed)
Tried calling patient again and he did not answer. Voicemail is full.

## 2020-04-28 NOTE — Telephone Encounter (Signed)
Patient left a voicemail on the nurse line stating that the wound clinic would like him to have a wheelchair. He wanted to know how to go about getting one... do we put an order in or does he just go get it himself?

## 2020-04-28 NOTE — Telephone Encounter (Signed)
I called the patient to let him know but he did not answer and his voicemail was full. I was unable to leave a message. I will try calling back later today.

## 2020-05-02 ENCOUNTER — Other Ambulatory Visit: Payer: Self-pay

## 2020-05-02 ENCOUNTER — Ambulatory Visit (INDEPENDENT_AMBULATORY_CARE_PROVIDER_SITE_OTHER): Payer: Medicare PPO | Admitting: Podiatry

## 2020-05-02 ENCOUNTER — Ambulatory Visit: Payer: Medicare PPO

## 2020-05-02 ENCOUNTER — Encounter: Payer: Self-pay | Admitting: Podiatry

## 2020-05-02 DIAGNOSIS — Z89421 Acquired absence of other right toe(s): Secondary | ICD-10-CM

## 2020-05-02 DIAGNOSIS — Z9889 Other specified postprocedural states: Secondary | ICD-10-CM

## 2020-05-03 ENCOUNTER — Encounter: Payer: Self-pay | Admitting: Podiatry

## 2020-05-03 NOTE — Progress Notes (Signed)
Subjective:  Patient ID: Ralph Peterson, male    DOB: 18-Feb-1966,  MRN: [id number]  Chief Complaint  Patient presents with  . Routine Post Op     POV #2 DOS 04/10/2020 RT SECOND METATARSAL/RAY AMPUTATION    54 y.o. male returns for post-op check.  Patient is doing well.  He has been keeping the bandages clean dry and intact.  Overall no acute complaints no clinical signs of infection.  He has been going to the wound care center for wound management.  Pain is well controlled.  Review of Systems: Negative except as noted in the HPI. Denies N/V/F/Ch.  Past Medical History:  Diagnosis Date  . Anxiety   . Arrhythmia    atrial fibrillation  . CAD (coronary artery disease)    a. 09/2010 Cath/PCI ([facility]): LM nl, LAD 79m, D1 80 (small), LCX 55m, OM1 30, RI 70 (small), RCA 70 (DES).  . CHF (congestive heart failure) ([place])   . Coronary artery disease   . COVID-19 virus infection 07/2019  . Diabetes mellitus without complication ([place])   . ESRD (end stage renal disease) ([place])   . Hyperlipidemia   . Hypertension   . Ischemic cardiomyopathy    a.  12/2011 Echo ([facility]): NL EF, mod LVH. Mild AS/MS, triv PR/TR. 07/2019 Echo: EF 25-30%, GR1 DD, inf/post HK, low nl RV fxn, mildly dil RA, triv MR, mild Ao sclerosis w/o stenosis.  . NSTEMI (non-ST elevated myocardial infarction) ([place]) 07/2019  . PAD (peripheral artery disease) ([place])    a. 03/2016 s/p PTA/DEB R SFA/popliteal/peroneal; b. 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 08/2017 Atherectomy/DEB dist L SFA/popliteal. PTA of L AT; d. 06/2018 PTA/DEB L SFA/popliteal/PT/AT; e. 01/2019 Stable ABIs.  . Sleep apnea     Current Outpatient Medications:  .  amiodarone (PACERONE) 200 MG tablet, Take 200 mg by mouth 2 (two) times daily., Disp: , Rfl:  .  apixaban (ELIQUIS) 5 MG TABS tablet, Take 1 tablet (5 mg total) by mouth 2 (two) times daily., Disp: 60 tablet, Rfl: 3 .  atorvastatin (LIPITOR) 40 MG tablet, Take 40 mg by mouth every evening. , Disp: , Rfl:  1 .  cinacalcet (SENSIPAR) 30 MG tablet, Take 30 mg by mouth daily., Disp: , Rfl:  .  escitalopram (LEXAPRO) 20 MG tablet, Take 20 mg by mouth daily. , Disp: , Rfl:  .  famotidine (PEPCID) 20 MG tablet, Take 20 mg by mouth daily., Disp: , Rfl:  .  glipiZIDE (GLUCOTROL) 5 MG tablet, Take 5 mg by mouth daily before breakfast. , Disp: , Rfl:  .  insulin detemir (LEVEMIR) 100 UNIT/ML injection, Inject 0.05 mLs (5 Units total) into the skin 2 (two) times daily., Disp: 10 mL, Rfl: 3 .  loperamide (IMODIUM) 2 MG capsule, Take 2 mg by mouth as needed for diarrhea or loose stools., Disp: , Rfl:  .  metoprolol succinate (TOPROL-XL) 50 MG 24 hr tablet, , Disp: , Rfl:  .  montelukast (SINGULAIR) 10 MG tablet, Take 10 mg by mouth daily., Disp: , Rfl:  .  SANTYL ointment, , Disp: , Rfl:  .  sevelamer carbonate (RENVELA) 800 MG tablet, Take 1,600-3,200 mg by mouth See admin instructions. Take 4 tablets (3200mg ) three times daily with meals and take 1 tablet (800mg ) by mouth with snacks, Disp: , Rfl:   Social History   Tobacco Use  Smoking Status Former Smoker  . Types: Cigarettes  . Quit date: 2010  . Years since quitting: 21.7  Smokeless Tobacco Never Used    Allergies  Allergen Reactions  . Penicillins Rash    Has patient had a PCN reaction causing immediate rash, facial/tongue/throat swelling, SOB or lightheadedness with hypotension: Yes Has patient had a PCN reaction causing severe rash involving mucus membranes or skin necrosis: Yes Has patient had a PCN reaction that required hospitalization: No Has patient had a PCN reaction occurring within the last 10 years: No If all of the above answers are "NO", then may proceed with Cephalosporin use.    Objective:   There were no vitals filed for this visit. There is no height or weight on file to calculate BMI. Constitutional Well developed. Well nourished.  Vascular Foot warm and well perfused. Capillary refill normal to all digits.    Neurologic Normal speech. Oriented to person, place, and time. Epicritic sensation to light touch grossly present bilaterally.  Dermatologic Skin healing well without signs of infection. Skin edges well coapted without signs of infection.  Fibrogranular ulcer noted to the right lateral foot no exposure of bone.  Periwound is within normal limits.  No clinical signs of infection noted.  No purulent drainage noted.  Orthopedic: Tenderness to palpation noted about the surgical site.   Radiographs: 3 views of skeletally mature the right foot: Sharp surgical margins noted at the amputation site of the second ray.  No radiographic signs of osteomyelitis noted at this time.  However I am concerned for possible osteomyelitis to the right lateral fifth metatarsal head Assessment:   1. S/P foot surgery, right   2. 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  S/p foot surgery right -Progressing as expected post-operatively. -XR: See above -WB Status: Weightbearing as tolerated in Darco wedge shoe -Sutures: Intact.  There is dehiscence present.  However the wound is being primarily managed by the wound care center for now. -Medications: None -The foot was being dressed with silver alginate dressing as per wound care instructions.  I will continue to monitor the patient from periphery if there is regression especially of the right lateral foot ulcer patient may need to undergo transmetatarsal amputation.  Patient states understanding  Right lateral foot ulcer with fat layer exposed -Patient the etiology of ulceration versus treatment options were discussed.  I believe patient will benefit from aggressive debridement of the ulcer with application of Santyl.  Patient states understanding -After reviewing the radiographs I am worried that patient may have possible osteomyelitis of the fifth metatarsal head.   Given that he has other ray amputation he is at high risk of undergoing transmetatarsal amputation at this time. -His wound is being managed by the wound care center at [facility].   No follow-ups on file.     No follow-ups on file.

## 2020-05-04 ENCOUNTER — Encounter (INDEPENDENT_AMBULATORY_CARE_PROVIDER_SITE_OTHER): Payer: Self-pay | Admitting: Vascular Surgery

## 2020-05-04 ENCOUNTER — Ambulatory Visit (INDEPENDENT_AMBULATORY_CARE_PROVIDER_SITE_OTHER): Payer: Medicare PPO | Admitting: Vascular Surgery

## 2020-05-04 ENCOUNTER — Ambulatory Visit (INDEPENDENT_AMBULATORY_CARE_PROVIDER_SITE_OTHER): Payer: Medicare PPO

## 2020-05-04 ENCOUNTER — Other Ambulatory Visit: Payer: Self-pay

## 2020-05-04 VITALS — BP 118/77 | HR 62 | Resp 16

## 2020-05-04 DIAGNOSIS — I7025 Atherosclerosis of native arteries of other extremities with ulceration: Secondary | ICD-10-CM

## 2020-05-04 DIAGNOSIS — I4891 Unspecified atrial fibrillation: Secondary | ICD-10-CM

## 2020-05-04 DIAGNOSIS — T829XXS Unspecified complication of cardiac and vascular prosthetic device, implant and graft, sequela: Secondary | ICD-10-CM

## 2020-05-04 DIAGNOSIS — N186 End stage renal disease: Secondary | ICD-10-CM

## 2020-05-04 DIAGNOSIS — I739 Peripheral vascular disease, unspecified: Secondary | ICD-10-CM

## 2020-05-04 DIAGNOSIS — Z95820 Peripheral vascular angioplasty status with implants and grafts: Secondary | ICD-10-CM | POA: Diagnosis not present

## 2020-05-04 DIAGNOSIS — I25118 Atherosclerotic heart disease of native coronary artery with other forms of angina pectoris: Secondary | ICD-10-CM | POA: Diagnosis not present

## 2020-05-04 NOTE — Progress Notes (Signed)
MRN : [id number]  Ralph Peterson is a 54 y.o. (23-Apr-1966) male who presents with chief complaint of  Chief Complaint  Patient presents with   Follow-up    [facility] post le angio with abi  .  History of Present Illness:   The patient returns to the office for followup and review of the noninvasive studies. There have been no interval changes in lower extremity symptoms. No interval shortening of the patient's claudication distance or development of rest pain symptoms. He states the wound is doing well.  No new ulcers or wounds have occurred since the last visit.  There have been no significant changes to the patient's overall health care.  He notes that dialysis is requesting an ultrasound of his right arm fistula because of low flow and he is now on the machine for 4 hours and 45 minutes.  The patient denies amaurosis fugax or recent TIA symptoms. There are no recent neurological changes noted. The patient denies history of DVT, PE or superficial thrombophlebitis. The patient denies recent episodes of angina or shortness of breath.   ABI Rt=[place] and Lt=[place] biphasic signals  (previous ABI's Rt=[place] and Lt= monophasic) Duplex ultrasound of the SFA stent is patent with biphasic flow  Current Meds  Medication Sig   amiodarone (PACERONE) 200 MG tablet Take 200 mg by mouth 2 (two) times daily.   atorvastatin (LIPITOR) 40 MG tablet Take 40 mg by mouth every evening.    cinacalcet (SENSIPAR) 30 MG tablet Take 30 mg by mouth daily.   escitalopram (LEXAPRO) 20 MG tablet Take 20 mg by mouth daily.    famotidine (PEPCID) 20 MG tablet Take 20 mg by mouth daily.   glipiZIDE (GLUCOTROL) 5 MG tablet Take 5 mg by mouth daily before breakfast.    levofloxacin (LEVAQUIN) 500 MG tablet    loperamide (IMODIUM) 2 MG capsule Take 2 mg by mouth as needed for diarrhea or loose stools.   metoprolol succinate (TOPROL-XL) 50 MG 24 hr tablet    montelukast (SINGULAIR) 10 MG tablet Take 10 mg by  mouth daily.   SANTYL ointment    sevelamer carbonate (RENVELA) 800 MG tablet Take 1,600-3,200 mg by mouth See admin instructions. Take 4 tablets (3200mg ) three times daily with meals and take 1 tablet (800mg ) by mouth with snacks    Past Medical History:  Diagnosis Date   Anxiety    Arrhythmia    atrial fibrillation   CAD (coronary artery disease)    a. 09/2010 Cath/PCI ([facility]): LM nl, LAD 15m, D1 80 (small), LCX 30m, OM1 30, RI 70 (small), RCA 70 (DES).   CHF (congestive heart failure) (HCC)    Coronary artery disease    COVID-19 virus infection 07/2019   Diabetes mellitus without complication (HCC)    ESRD (end stage renal disease) ([place])    Hyperlipidemia    Hypertension    Ischemic cardiomyopathy    a.  12/2011 Echo ([facility]): NL EF, mod LVH. Mild AS/MS, triv PR/TR. 07/2019 Echo: EF 25-30%, GR1 DD, inf/post HK, low nl RV fxn, mildly dil RA, triv MR, mild Ao sclerosis w/o stenosis.   NSTEMI (non-ST elevated myocardial infarction) ([place]) 07/2019   PAD (peripheral artery disease) ([place])    a. 03/2016 s/p PTA/DEB R SFA/popliteal/peroneal; b. 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 08/2017 Atherectomy/DEB dist L SFA/popliteal. PTA of L AT; d. 06/2018 PTA/DEB L SFA/popliteal/PT/AT; e. 01/2019 Stable ABIs.   Sleep apnea     Past Surgical History:  Procedure Laterality Date   ABDOMINAL AORTOGRAM N/A 09/10/2017   Procedure: ABDOMINAL AORTOGRAM;  Surgeon: Wellington Hampshire, MD;  Location: [place] [facility];  Service: Cardiovascular;  Laterality: N/A;   AMPUTATION TOE Bilateral    one toe on right, all five toes on the left   CARDIAC CATHETERIZATION  2012   [facility]; X1 stent 2.5 x 33 mm xience stent Distal TCA   LOWER EXTREMITY ANGIOGRAPHY Bilateral 09/10/2017   Procedure: Lower Extremity Angiography;  Surgeon: Wellington Hampshire, MD;  Location: [place] [facility];  Service: Cardiovascular;  Laterality: Bilateral;   LOWER EXTREMITY ANGIOGRAPHY Left 06/23/2018    Procedure: LOWER EXTREMITY ANGIOGRAPHY;  Surgeon: Katha Cabal, MD;  Location: [place] [facility];  Service: Cardiovascular;  Laterality: Left;   LOWER EXTREMITY ANGIOGRAPHY Right 02/01/2020   Procedure: LOWER EXTREMITY ANGIOGRAPHY;  Surgeon: Katha Cabal, MD;  Location: [place] [facility];  Service: Cardiovascular;  Laterality: Right;   PERIPHERAL VASCULAR ATHERECTOMY Left 09/10/2017   Procedure: PERIPHERAL VASCULAR ATHERECTOMY;  Surgeon: Wellington Hampshire, MD;  Location: [place] [facility];  Service: Cardiovascular;  Laterality: Left;  SFA/POPLITEAL   PERIPHERAL VASCULAR BALLOON ANGIOPLASTY Left 09/10/2017   Procedure: PERIPHERAL VASCULAR BALLOON ANGIOPLASTY;  Surgeon: Wellington Hampshire, MD;  Location: [place] [facility];  Service: Cardiovascular;  Laterality: Left;  ANTERIAL TIBIAL   PERIPHERAL VASCULAR CATHETERIZATION Right 03/26/2016   Procedure: Lower Extremity Angiography;  Surgeon: Katha Cabal, MD;  Location: [place] [facility];  Service: Cardiovascular;  Laterality: Right;   PERIPHERAL VASCULAR INTERVENTION Left 09/10/2017   Procedure: PERIPHERAL VASCULAR INTERVENTION;  Surgeon: Wellington Hampshire, MD;  Location: [place] [facility];  Service: Cardiovascular;  Laterality: Left;  SFA/POPLITEAL    Social History Social History   Tobacco Use   Smoking status: Former Smoker    Types: Cigarettes    Quit date: 2010    Years since quitting: 11.7   Smokeless tobacco: Never Used  Scientific laboratory technician Use: Never used  Substance Use Topics   Alcohol use: Not Currently   Drug use: No    Family History Family History  Problem Relation Age of Onset   Hypertension Mother     Allergies  Allergen Reactions   Penicillins Rash    Has patient had a PCN reaction causing immediate rash, facial/tongue/throat swelling, SOB or lightheadedness with hypotension: Yes Has patient had a PCN reaction causing severe rash involving mucus membranes or skin necrosis: Yes Has  patient had a PCN reaction that required hospitalization: No Has patient had a PCN reaction occurring within the last 10 years: No If all of the above answers are "NO", then may proceed with Cephalosporin use.      REVIEW OF SYSTEMS (Negative unless checked)  Constitutional: [] Weight loss  [] Fever  [] Chills Cardiac: [] Chest pain   [] Chest pressure   [] Palpitations   [] Shortness of breath when laying flat   [] Shortness of breath with exertion. Vascular:  [x] Pain in legs with walking   [] Pain in legs at rest  [] History of DVT   [] Phlebitis   [] Swelling in legs   [] Varicose veins   [x] Healing ulcers Pulmonary:   [] Uses home oxygen   [] Productive cough   [] Hemoptysis   [] Wheeze  [] COPD   [] Asthma Neurologic:  [] Dizziness   [] Seizures   [] History of stroke   [] History of TIA  [] Aphasia   [] Vissual changes   [] Weakness or numbness in arm   [] Weakness or numbness in  leg Musculoskeletal:   [] Joint swelling   [] Joint pain   [] Low back pain Hematologic:  [] Easy bruising  [] Easy bleeding   [] Hypercoagulable state   [] Anemic Gastrointestinal:  [] Diarrhea   [] Vomiting  [] Gastroesophageal reflux/heartburn   [] Difficulty swallowing. Genitourinary:  [x] Chronic kidney disease   [] Difficult urination  [] Frequent urination   [] Blood in urine Skin:  [] Rashes   [x] Ulcers  Psychological:  [] History of anxiety   []  History of major depression.  Physical Examination  Vitals:   05/04/20 1427  BP: 118/77  Pulse: 62  Resp: 16   There is no height or weight on file to calculate BMI. Gen: WD/WN, NAD Head: [place]/AT, No temporalis wasting.  Ear/Nose/Throat: Hearing grossly intact, nares w/o erythema or drainage Eyes: PER, EOMI, sclera nonicteric.  Neck: Supple, no large masses.   Pulmonary:  Good air movement, no audible wheezing bilaterally, no use of accessory muscles.  Cardiac: RRR, no JVD Vascular:  Right foot bandaged Vessel Right Left  Radial Palpable Palpable  PT Not Palpable Not Palpable  DP wrapped  Not Palpable  Gastrointestinal: Non-distended. No guarding/no peritoneal signs.  Musculoskeletal: M/S 5/5 throughout.  No deformity or atrophy.  Neurologic: CN 2-12 intact. Symmetrical.  Speech is fluent. Motor exam as listed above. Psychiatric: Judgment intact, Mood & affect appropriate for pt's clinical situation. Dermatologic: No rashes or ulcers noted.  No changes consistent with cellulitis. Lymph : No lichenification or skin changes of chronic lymphedema.  CBC Lab Results  Component Value Date   WBC 13.5 (H) 03/10/2020   HGB 10.8 (L) 03/10/2020   HCT 34.2 (L) 03/10/2020   MCV 91.4 03/10/2020   PLT 167 03/10/2020    BMET    Component Value Date/Time   NA 129 (L) 03/10/2020 0440   NA 139 03/19/2016 1423   K 4.2 03/10/2020 0440   CL 88 (L) 03/10/2020 0440   CO2 28 03/10/2020 0440   GLUCOSE 114 (H) 03/10/2020 0440   BUN 62 (H) 03/10/2020 0440   BUN 33 (H) 03/19/2016 1423   CREATININE 6.98 (H) 03/10/2020 0440   CALCIUM 8.0 (L) 03/10/2020 0440   GFRNONAA 8 (L) 03/10/2020 0440   GFRAA 10 (L) 03/10/2020 0440   CrCl cannot be calculated (Patient's most recent lab result is older than the maximum 21 days allowed.).  COAG Lab Results  Component Value Date   INR 1.5 (H) 03/07/2020   INR 1.2 07/28/2019   INR 1.16 09/05/2017    Radiology DG Foot Complete Right  Result Date: 04/18/2020 Please see detailed radiograph report in office note.  DG Foot Complete Right  Result Date: 04/06/2020 Please see detailed radiograph report in office note.    Assessment/Plan 1. Atherosclerosis of native arteries of the extremities with ulceration ([place]) Recommend:  The patient is status post successful angiogram with intervention.  The patient reports that the claudication symptoms and leg pain is essentially gone.   The patient denies lifestyle limiting changes at this point in time.  No further invasive studies, angiography or surgery at this time The patient should continue  walking and begin a more formal exercise program.  The patient should continue antiplatelet therapy and aggressive treatment of the lipid abnormalities  Smoking cessation was again discussed  Patient should undergo noninvasive studies as ordered. The patient will follow up with me after the studies.   - VAS [place] ABI WITH/WO TBI; Future - VAS [place] LOWER EXTREMITY ARTERIAL DUPLEX; Future  2. Complication of vascular access for dialysis, sequela Recommend:  The patient  is doing well and currently has adequate dialysis access. The patient's dialysis center is not reporting any access issues. Flow pattern is stable when compared to the prior ultrasound.  The patient should have a duplex ultrasound of the dialysis access in 3 months.  The patient will follow-up with me in the office after each ultrasound    - VAS [place] (AVF, AVG); Future  3. Atrial fibrillation with RVR (HCC) Continue antiarrhythmia medications as already ordered, these medications have been reviewed and there are no changes at this time.  Continue anticoagulation as ordered by Cardiology Service   4. Coronary artery disease of native artery of native heart with stable angina pectoris (HCC) Continue cardiac and antihypertensive medications as already ordered and reviewed, no changes at this time.  Continue statin as ordered and reviewed, no changes at this time  Nitrates PRN for chest pain   5. ESRD (end stage renal disease) ([place]) At the present time the patient has adequate dialysis access.  Continue hemodialysis as ordered without interruption.  Avoid nephrotoxic medications and dehydration.  Further plans per nephrology    Hortencia Pilar, MD  05/04/2020 2:36 PM

## 2020-05-05 ENCOUNTER — Encounter (INDEPENDENT_AMBULATORY_CARE_PROVIDER_SITE_OTHER): Payer: Self-pay | Admitting: Vascular Surgery

## 2020-05-05 DIAGNOSIS — I701 Atherosclerosis of renal artery: Secondary | ICD-10-CM | POA: Insufficient documentation

## 2020-05-09 ENCOUNTER — Encounter: Payer: Medicare PPO | Admitting: Physician Assistant

## 2020-05-09 ENCOUNTER — Other Ambulatory Visit: Payer: Self-pay

## 2020-05-09 DIAGNOSIS — E11621 Type 2 diabetes mellitus with foot ulcer: Secondary | ICD-10-CM | POA: Diagnosis not present

## 2020-05-09 NOTE — Progress Notes (Addendum)
RAYBON, CONARD ([id number]) Visit Report for 05/09/2020 Chief Complaint Document Details Patient Name: Ralph Peterson, Ralph Peterson. Date of Service: 05/09/2020 9:00 AM Medical Record Number: [id number] Patient Account Number: [id number] Date of Birth/Sex: [birth year] (54 y.o. 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 Date of Service: 05/09/2020 9:00 AM Medical Record Number: [id number] Patient Account Number: [id number] Date of Birth/Sex: [birth year] (54 y.o. M) Treating RN: Cornell Barman Primary Care Provider: Vidal Schwalbe Other Clinician: Referring Provider: Vidal Schwalbe Treating Provider/Extender: Melburn Hake, Shekita Boyden Weeks in Treatment: 1 Debridement Performed for Wound #1 Right,Lateral Foot Assessment: Performed By: Physician STONE III, Anzlee Hinesley E., PA-C Debridement Type: Debridement Level of Consciousness (Pre- Awake and Alert procedure): Pre-procedure Verification/Time Out Yes - 09:31 Taken: Start Time: 09:31 Pain Control: Lidocaine Total Area Debrided (L x W): 2.7 (cm) x 2.1 (cm) = 5.67 (cm) Tissue and other material Viable, Non-Viable, Slough, Subcutaneous, Tendon, Slough debrided: Level: Skin/Subcutaneous Tissue/Muscle Debridement Description: Excisional Instrument: Forceps, Scissors Bleeding: Minimum Hemostasis Achieved: Pressure Response to Treatment: Procedure was tolerated well Level of  Consciousness (Post- Awake and Alert procedure): Post Debridement Measurements of Total Wound Length: (cm) 2.7 Stage: Category/Stage IV Width: (cm) 2.1 Depth: (cm) 0.8 Volume: (cm) 3.563 Character of Wound/Ulcer Post Debridement: Stable Post Procedure Diagnosis Same as Pre-procedure Electronic Signature(s) Signed: 05/09/2020 5:31:50 PM By: Gretta Cool, BSN, RN, CWS, Kim RN, BSN Signed: 05/10/2020 4:38:25 PM By: Worthy Keeler PA-C Entered By: Gretta Cool, BSN, RN, CWS, Kim on 05/09/2020 09:32:18 Ralph Peterson ([id number]) -------------------------------------------------------------------------------- Debridement Details Patient Name: Ralph Peterson. Date of Service: 05/09/2020 9:00 AM Medical Record Number: [id number] Patient Account Number: [id number] Date of Birth/Sex: 28-Feb-1966 (54 y.o. M) Treating RN: Cornell Barman Primary Care Provider: Vidal Schwalbe Other Clinician: Referring Provider: Vidal Schwalbe Treating Provider/Extender: Melburn Hake, Nollie Shiflett Weeks in Treatment: 1 Debridement Performed for Wound #2 Right,Distal Foot Assessment: Performed By: Physician STONE III, Karyssa Amaral E., PA-C Debridement Type: Debridement Level of Consciousness (Pre- Awake and Alert procedure): Pre-procedure Verification/Time Out Yes - 09:31 Taken: Start Time: 09:31 Pain Control: Lidocaine Total Area Debrided (L x W): 5.5 (cm) x 1.5 (cm) = 8.25 (cm) Tissue and other material Viable, Non-Viable, Slough, Subcutaneous, Slough debrided: Level: Skin/Subcutaneous Tissue Debridement Description: Excisional Instrument: Curette Bleeding: Minimum Hemostasis Achieved: Pressure Response to Treatment: Procedure was tolerated well Level of Consciousness (Post- Awake and Alert procedure): Post Debridement Measurements of Total Wound Length: (cm) 5.5 Width: (cm) 1.5 Depth: (cm) 1.6 Volume: (cm) 10.367 Character of Wound/Ulcer Post Debridement: Stable Post Procedure Diagnosis Same as  Pre-procedure Electronic Signature(s) Signed: 05/09/2020 5:31:50 PM By: Gretta Cool, BSN, RN, CWS, Kim RN, BSN Signed: 05/10/2020 4:38:25 PM By: Worthy Keeler PA-C Entered By: Gretta Cool, BSN, RN, CWS, Kim on 05/09/2020 09:34:02 Ralph Peterson ([id number]) -------------------------------------------------------------------------------- HPI Details Patient Name: Ralph Peterson. Date of Service: 05/09/2020 9:00 AM Medical Record Number: [id number] Patient Account Number: [id number] Date of Birth/Sex: [birth year] (54 y.o. M) Treating RN: Cornell Barman Primary Care Provider: Vidal Schwalbe Other Clinician: Referring Provider: Vidal Schwalbe Treating Provider/Extender: Melburn Hake, Greig Altergott Weeks in Treatment: 1 History of Present Illness HPI Description: He has been having with multiple  ulcers of his feet. He does have a couple callus areas that have ulcerations underneath on the left foot. 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 Fortunately there is no sign of active infection at this time which is good news he has been using Telfa pads after applying Betadine this is not really absorbing enough of the drainage however which is causing the area especially at the amputation site to be very macerated. The patient states that he actually has a work-up with Dr. Delana Meyer at [facility] next Thursday. For that reason we did not perform ABIs today and again I really am not doing any significant debridement today we will discontinue mild things to remove some of the necrotic debris so that we hopefully get these wounds moving in the proper direction. With that being said his extremities do appear to be warm without any signs obviously of significant arterial flow. The patient does have a history of diabetes mellitus type 2, congestive heart failure, end- stage renal disease,  and dependence on renal dialysis. He notes that he did have an exacerbation of heart failure recently which caused his legs to swell he had a couple wounds on his left leg but fortunately these have actually improved and are healed as of today. 05/09/2020 on evaluation today patient appears to be doing decently well all things considered in regard to his wounds. He does have the sutures out at this point in regard to his right amputation site. That does seem to be showing signs of improvement which is good news from the standpoint of at least being able to get some of the slough out although he does have quite a bit of slough buildup on the surface of the wound here. I think that is Ralph Peterson require debridement before were able to see dramatic filling in. He may even require wound VAC at the site. With regard to the lateral foot this seems to be loosening up with the Santyl and I am pleased in that regard. In regard to the left foot everything appears to be healed and is doing great. Electronic Signature(s) Signed: 05/09/2020 9:48:05 AM By: Worthy Keeler PA-C Entered By: Worthy Keeler on 05/09/2020 09:48:04 Ralph Peterson ([id number]) -------------------------------------------------------------------------------- Physical Exam Details Patient Name: Ralph Peterson Date of Service: 05/09/2020 9:00 AM Medical Record Number: [id number] Patient Account Number: [id number] Date of Birth/Sex: [birth year] (54 y.o. M) Treating RN: Cornell Barman Primary Care Provider: Vidal Schwalbe Other Clinician: Referring Provider: Vidal Schwalbe Treating Provider/Extender: STONE III, Daiana Vitiello Weeks in Treatment: 1 Constitutional Well-nourished and well-hydrated in no acute distress. Respiratory normal breathing without difficulty. Psychiatric this patient is able to make decisions and demonstrates good insight into disease process. Alert and Oriented x 3. pleasant and cooperative. Notes Upon inspection  patient's wounds again showed signs of overall improvement especially on the left foot which is completely healed. Improved clean up of the slough on the right lateral foot which is excellent news. He also has loosening up of the lot of the necrotic tissue in the central portion of the amputation site on his foot distally. I was able to sharply debride this away today which he tolerated without complication post debridement the wound bed appears to be doing much better which is great news. Still does have quite a bit of depth and is good to take  some time to heal and I think alginate is still appropriate for the time being. Nonetheless I am considering a wound VAC depending on how things progress over the next week. Electronic Signature(s) Signed: 05/09/2020 9:48:50 AM By: Worthy Keeler PA-C Entered By: Worthy Keeler on 05/09/2020 09:48:49 Ralph Peterson ([id number]) -------------------------------------------------------------------------------- Physician Orders Details Patient Name: Ralph Peterson Date of Service: 05/09/2020 9:00 AM Medical Record Number: [id number] Patient Account Number: [id number] Date of Birth/Sex: [birth year] (54 y.o. 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 Wound #2 Right,Distal Foot o Clean wound with Normal Saline. Anesthetic (add to Medication List) Wound #1 Right,Lateral Foot o Topical Lidocaine 4% cream applied to wound bed prior to debridement (In Clinic Only). Wound #2 Right,Distal Foot o Topical Lidocaine 4% cream applied to wound bed prior to debridement (In Clinic Only). Primary Wound Dressing Wound #1 Right,Lateral Foot o Santyl Ointment o Silver Alginate Wound #2 Right,Distal Foot o Silver Alginate Secondary Dressing Wound #1 Right,Lateral Foot o ABD and Kerlix/Conform o Other - stretchnet Wound #2 Right,Distal Foot o ABD and Kerlix/Conform o Other - stretchnet Dressing Change Frequency Wound #1 Right,Lateral Foot o Change dressing every day. Wound #2 Right,Distal Foot o Change dressing every day. Follow-up Appointments Wound #1 Right,Lateral Foot MARTINO, TOMPSON. ([id number]) o Return Appointment in 1 week. Wound #2 Right,Distal Foot o Return Appointment in 1 week. Electronic Signature(s) Signed: 05/09/2020 5:31:50 PM By: Gretta Cool, BSN, RN, CWS, Kim RN, BSN Signed: 05/10/2020 4:38:25 PM By: Worthy Keeler PA-C Entered By: Gretta Cool BSN, RN, CWS, Kim on 05/09/2020 09:35:44 Ralph Peterson ([id number]) -------------------------------------------------------------------------------- Problem List Details Patient Name: BOCEPHUS, CALI. Date of Service: 05/09/2020 9:00 AM Medical Record Number: [id number] Patient Account Number: [id number] Date of Birth/Sex: 1966-03-20 (54 y.o. M) Treating RN: Cornell Barman Primary Care Provider: Vidal Schwalbe Other Clinician: Referring Provider: Vidal Schwalbe Treating Provider/Extender: Melburn Hake, Maliea Grandmaison Weeks in Treatment: 1 Active Problems ICD-10 Encounter Code Description Active Date MDM Diagnosis L89.893 Pressure  ulcer of other site, stage 3 04/27/2020 No Yes T81.31XA Disruption of external operation (surgical) wound, not elsewhere 04/27/2020 No Yes classified, initial encounter L97.512 Non-pressure chronic ulcer of other part of right foot with fat layer 04/27/2020 No Yes exposed L97.522 Non-pressure chronic ulcer of other part of left foot with fat layer 04/27/2020 No Yes exposed E11.621 Type 2 diabetes mellitus with foot ulcer 04/27/2020 No Yes I50.42 Chronic combined systolic (congestive) and diastolic (congestive) heart 04/27/2020 No Yes failure N18.6 End stage renal disease 04/27/2020 No Yes Z99.2 Dependence on renal dialysis 04/27/2020 No Yes Inactive Problems Resolved Problems Electronic Signature(s) Signed: 05/09/2020 9:50:31 AM By: Worthy Keeler PA-C Previous Signature: 05/09/2020 9:07:27 AM Version By: Worthy Keeler PA-C Entered By: Worthy Keeler on 05/09/2020 09:50:31 Ralph Peterson ([id number]) -------------------------------------------------------------------------------- Progress Note Details Patient Name:  Galeana, Diallo S. Date of Service: 05/09/2020 9:00 AM Medical Record Number: [id number] Patient Account Number: [id number] Date of Birth/Sex: [birth year] (54 y.o. 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 He does have a couple callus areas that have ulcerations underneath on the left foot. 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 Fortunately there is no sign of active  infection at this time which is good news he has been using Telfa pads after applying Betadine this is not really absorbing enough of the drainage however which is causing the area especially at the amputation site to be very macerated. The patient states that he actually has a work-up with Dr. Delana Meyer at vein [facility] vascular next Thursday. For that reason we did not perform ABIs today and again I really am not doing any significant debridement today we will discontinue mild things to remove some of the necrotic debris so that we hopefully get these wounds moving in the proper direction. With that being said his extremities do appear to be warm without any signs obviously of significant arterial flow. The patient does have a history of diabetes mellitus type 2, congestive heart failure, end- stage renal disease, and dependence on renal dialysis. He notes that he did have an exacerbation of heart failure recently which caused his legs to swell he had a couple wounds on his left leg but fortunately these have actually improved and are healed as of today. 05/09/2020 on evaluation today patient appears to be doing decently well all things considered in regard to his wounds. He does have the sutures out at this point in regard to his right amputation site. That does seem to be showing signs of improvement which is good news from the standpoint of at least being able to get some of the slough out although he does have quite a bit of slough buildup on the surface of the wound here. I think that is Ralph Peterson require debridement before were able to see dramatic filling in. He may even require wound VAC at the site. With regard to the lateral foot this seems to be loosening up with the Santyl and I am pleased in that regard. In regard to the left foot everything appears to be healed and is doing great. Objective Constitutional Well-nourished and well-hydrated in no acute distress. Vitals Time Taken: 8:57 AM, Height:  72 in, Weight: 225 lbs, BMI: 30.5, Temperature: 98.3 F, Pulse: 65 bpm, Respiratory Rate: 18 breaths/min, Blood Pressure: 128/84 mmHg. Respiratory normal breathing without difficulty. Psychiatric this patient is able to make decisions and demonstrates good insight into disease process. Alert and Oriented x 3. pleasant and cooperative. General Notes: Upon inspection patient's wounds again showed signs of overall improvement especially on the left foot which is completely healed. Improved clean up of the slough on the right lateral foot which is excellent news. He also has loosening up of the lot of the necrotic tissue in the central portion of the amputation site on his foot distally. I was able to sharply debride this away today which he  tolerated without complication post debridement the wound bed appears to be doing much better which is great news. Still does have quite a bit of depth and is good to take some time to heal and I think alginate is still appropriate for the time being. Nonetheless I am considering a wound VAC depending on how things progress over the next week. Integumentary (Hair, Skin) Wound #1 status is Open. Original cause of wound was Pressure Injury. The wound is located on the Right,Lateral Foot. The wound measures 2.7cm length x 2.1cm width x 0.6cm depth; 4.453cm^2 area and 2.672cm^3 volume. There is joint and Fat Layer (Subcutaneous Tissue) exposed. There is no tunneling or undermining noted. There is a medium amount of serosanguineous drainage noted. There is no granulation within the wound bed. There is a large (67-100%) amount of necrotic tissue within the wound bed including Adherent Slough. Wound #2 status is Open. Original cause of wound was Surgical Injury. The wound is located on the Right,Distal Foot. The wound measures 5.5cm length x 1.5cm width x 1.6cm depth; 6.48cm^2 area and 10.367cm^3 volume. There is Fat Layer (Subcutaneous Tissue) exposed. There is no  tunneling or undermining noted. There is a medium amount of serosanguineous drainage noted. There is no granulation within the wound bed. There is a large (67-100%) amount of necrotic tissue within the wound bed including Adherent Slough. DELOREAN, KNUTZEN ([id number]) Wound #3 status is Open. Original cause of wound was Pressure Injury. The wound is located on the [place]. The wound measures 0cm length x 0cm width x 0cm depth; 0cm^2 area and 0cm^3 volume. There is no tunneling or undermining noted. There is a none present amount of drainage noted. The wound margin is flat and intact. There is no granulation within the wound bed. There is no necrotic tissue within the wound bed. Wound #4 status is Open. Original cause of wound was Pressure Injury. The wound is located on the [place]. The wound measures 0cm length x 0cm width x 0cm depth; 0cm^2 area and 0cm^3 volume. There is no tunneling or undermining noted. There is a none present amount of drainage noted. The wound margin is flat and intact. There is no granulation within the wound bed. There is no necrotic tissue within the wound bed. Assessment Active Problems ICD-10 Pressure ulcer of other site, stage 3 Disruption of external operation (surgical) wound, not elsewhere classified, initial encounter Non-pressure chronic ulcer of other part of right foot with fat layer exposed Non-pressure chronic ulcer of other part of left foot with fat layer exposed Type 2 diabetes mellitus with foot ulcer Chronic combined systolic (congestive) and diastolic (congestive) heart failure End stage renal disease Dependence on renal dialysis Procedures Wound #1 Pre-procedure diagnosis of Wound #1 is a Pressure Ulcer located on the Right,Lateral Foot . There was a Excisional Skin/Subcutaneous Tissue/Muscle Debridement with a total area of 5.67 sq cm performed by STONE III, Arisbel Maione E., PA-C. With the following instrument(s):  Forceps, and Scissors to remove Viable and Non-Viable tissue/material. Material removed includes Tendon, Subcutaneous Tissue, and Slough after achieving pain control using Lidocaine. No specimens were taken. A time out was conducted at 09:31, prior to the start of the procedure. A Minimum amount of bleeding was controlled with Pressure. The procedure was tolerated well. Post Debridement Measurements: 2.7cm length x 2.1cm width x 0.8cm depth; 3.563cm^3 volume. Post debridement Stage noted as Category/Stage IV. Character of Wound/Ulcer Post Debridement is stable. Post procedure Diagnosis Wound #1: Same as Pre-Procedure Wound #2 Pre-procedure  diagnosis of Wound #2 is an Open Surgical Wound located on the Right,Distal Foot . There was a Excisional Skin/Subcutaneous Tissue Debridement with a total area of 8.25 sq cm performed by STONE III, Mcclellan Demarais E., PA-C. With the following instrument(s): Curette to remove Viable and Non-Viable tissue/material. Material removed includes Subcutaneous Tissue and Slough and after achieving pain control using Lidocaine. No specimens were taken. A time out was conducted at 09:31, prior to the start of the procedure. A Minimum amount of bleeding was controlled with Pressure. The procedure was tolerated well. Post Debridement Measurements: 5.5cm length x 1.5cm width x 1.6cm depth; 10.367cm^3 volume. Character of Wound/Ulcer Post Debridement is stable. Post procedure Diagnosis Wound #2: Same as Pre-Procedure Plan Wound Cleansing: Wound #1 Right,Lateral Foot: Clean wound with Normal Saline. Wound #2 Right,Distal Foot: Clean wound with Normal Saline. Anesthetic (add to Medication List): Wound #1 Right,Lateral Foot: Topical Lidocaine 4% cream applied to wound bed prior to debridement (In Clinic Only). Wound #2 Right,Distal Foot: Topical Lidocaine 4% cream applied to wound bed prior to debridement (In Clinic Only). Primary Wound Dressing: Wound #1 Right,Lateral  Foot: DYMOND, SPREEN ([id number]) Santyl Ointment Silver Alginate Wound #2 Right,Distal Foot: Silver Alginate Secondary Dressing: Wound #1 Right,Lateral Foot: ABD and Kerlix/Conform Other - stretchnet Wound #2 Right,Distal Foot: ABD and Kerlix/Conform Other - stretchnet Dressing Change Frequency: Wound #1 Right,Lateral Foot: Change dressing every day. Wound #2 Right,Distal Foot: Change dressing every day. Follow-up Appointments: Wound #1 Right,Lateral Foot: Return Appointment in 1 week. Wound #2 Right,Distal Foot: Return Appointment in 1 week. 1. I would recommend currently that we continue with a silver alginate dressing to the amputation site we will packed this into the open wound spot at this point. I think changing this daily is probably a good idea which is pretty much what is been doing anyway since yesterday apply the Santyl to the lateral foot wound daily as well. 2. With regard to the lateral foot again were using Santyl and this should be changed on a daily basis. 3. With regard to the left foot I think we Ralph Peterson discontinue wound care measures on that location at this point. 4. I am going to consider wound VAC next week depending on how the amputation site appears all depends on how he progresses between now and then. We will see patient back for reevaluation in 1 week here in the clinic. If anything worsens or changes patient will contact our office for additional recommendations. Electronic Signature(s) Signed: 05/09/2020 9:51:32 AM By: Worthy Keeler PA-C Previous Signature: 05/09/2020 9:49:47 AM Version By: Worthy Keeler PA-C Entered By: Worthy Keeler on 05/09/2020 09:51:32 Ralph Peterson ([id number]) -------------------------------------------------------------------------------- SuperBill Details Patient Name: Ralph Peterson Date of Service: 05/09/2020 Medical Record Number: [id number] Patient Account Number: [id number] Date of  Birth/Sex: Mar 14, 1966 (54 y.o. 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 E11.621 Type 2 diabetes mellitus with foot ulcer I50.42 Chronic combined systolic (congestive) and diastolic (congestive) heart failure N18.6 End stage renal disease Z99.2 Dependence on renal dialysis Facility Procedures CPT4 Code: 32202542 Description: 70623 - DEB  SUBQ TISSUE 20 SQ CM/< Modifier: Quantity: 1 CPT4 Code: Description: ICD-10 Diagnosis Description 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 T81.31XA Disruption of external operation (surgical) wound, not elsewhere classifie Modifier: sed d, initial encounter Quantity: CPT4 Code: 50569794 Description: 11043 - DEB MUSC/FASCIA 20 SQ CM/< Modifier: Quantity: 1 CPT4 Code: Description: ICD-10 Diagnosis Description 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 T81.31XA Disruption of external operation (surgical) wound, not elsewhere classifie Modifier: sed d, initial encounter Quantity: Physician Procedures CPT4 Code: 8016553 Description: 11042 - WC PHYS SUBQ TISS 20 SQ CM Modifier: Quantity: 1 CPT4 Code: Description: ICD-10 Diagnosis Description 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 T81.31XA Disruption of external operation (surgical) wound, not elsewhere classified, Modifier: d initial encounter Quantity: CPT4 Code:  [id number] Description: 11043 - WC PHYS DEBR MUSCLE/FASCIA 20 SQ CM Modifier: Quantity: 1 CPT4 Code: Description: ICD-10 Diagnosis Description 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

## 2020-05-10 NOTE — Progress Notes (Signed)
Ralph Peterson, Ralph Peterson ([id number]) Visit Report for 05/09/2020 Arrival Information Details Patient Name: [name], [name]. Date of Service: 05/09/2020 9:00 AM Medical Record Number: [id number] Patient Account Number: [id number] Date of Birth/Sex: [birth year] (54 y.o. M) Treating RN: Carlene Coria Primary Care Srihaan Mastrangelo: Vidal Schwalbe Other Clinician: Referring D[place] Ament: Vidal Schwalbe Treating Lyfe Monger/Extender: Melburn Hake, HOYT Weeks in Treatment: 1 Visit Information History Since Last Visit All ordered tests and consults were completed: No Patient Arrived: Wheel Chair Added or deleted any medications: No Arrival Time: 08:55 Any new allergies or adverse reactions: No Accompanied By: self Had a fall or experienced change in No Transfer Assistance: None activities of daily living that may affect Patient Identification Verified: Yes risk of falls: Secondary Verification Process Completed: Yes Signs or symptoms of abuse/neglect since last visito No Patient Requires Transmission-Based Precautions: No Hospitalized since last visit: No Patient Has Alerts: No Implantable device outside of the clinic excluding No cellular tissue based products placed in the center since last visit: Has Dressing in Place as Prescribed: Yes Pain Present Now: No Electronic Signature(s) Signed: 05/10/2020 4:54:21 PM By: Carlene Coria RN Entered By: Carlene Coria on 05/09/2020 08:57:49 Ralph Peterson ([id number]) -------------------------------------------------------------------------------- Encounter Discharge Information Details Patient Name: Ralph Peterson. Date of Service: 05/09/2020 9:00 AM Medical Record Number: [id number] Patient Account Number: [id number] Date of Birth/Sex: 11-10-1965 (55 y.o. M) Treating RN: Cornell Barman Primary Care Kenli Waldo: Vidal Schwalbe Other Clinician: Referring Carley Glendenning: Vidal Schwalbe Treating Debora Stockdale/Extender: Melburn Hake, HOYT Weeks in Treatment:  1 Encounter Discharge Information Items Post Procedure Vitals Discharge Condition: Stable Temperature (F): 98.3 Ambulatory Status: Ambulatory Pulse (bpm): 65 Discharge Destination: Home Respiratory Rate (breaths/min): 18 Transportation: Private Auto Blood Pressure (mmHg): 128/84 Accompanied By: self Schedule Follow-up Appointment: Yes Clinical Summary of Care: Electronic Signature(s) Signed: 05/09/2020 5:31:50 PM By: Gretta Cool, BSN, RN, CWS, Kim RN, BSN Entered By: Gretta Cool, BSN, RN, CWS, Kim on 05/09/2020 09:37:34 Ralph Peterson ([id number]) -------------------------------------------------------------------------------- Lower Extremity Assessment Details Patient Name: Ralph Peterson. Date of Service: 05/09/2020 9:00 AM Medical Record Number: [id number] Patient Account Number: [id number] Date of Birth/Sex: [birth year] (54 y.o. M) Treating RN: Carlene Coria Primary Care Idaliz Tinkle: Vidal Schwalbe Other Clinician: Referring Mathew Postiglione: Vidal Schwalbe Treating Nolan Tuazon/Extender: STONE III, HOYT Weeks in Treatment: 1 Edema Assessment Assessed: [Left: No] [Right: No] [Left: Edema] [Right: :] Calf Left: Right: Point of Measurement: From Medial Instep 37 cm 30 cm Ankle Left: Right: Point of Measurement: From Medial Instep 23 cm 25 cm Electronic Signature(s) Signed: 05/10/2020 4:54:21 PM By: Carlene Coria RN Entered By: Carlene Coria on 05/09/2020 09:19:39 Ralph Peterson ([id number]) -------------------------------------------------------------------------------- Multi Wound Chart Details Patient Name: Ralph Peterson. Date of Service: 05/09/2020 9:00 AM Medical Record Number: [id number] Patient Account Number: [id number] Date of Birth/Sex: Mar 29, 1966 (54 y.o. M) Treating RN: Cornell Barman Primary Care Latif Nazareno: Vidal Schwalbe Other Clinician: Referring Destinie Thornsberry: Vidal Schwalbe Treating Fannye Myer/Extender: Melburn Hake, HOYT Weeks in Treatment: 1 Vital  Signs Height(in): 72 Pulse(bpm): 65 Weight(lbs): 225 Blood Pressure(mmHg): 128/84 Body Mass Index(BMI): 31 Temperature(F): 98.3 Respiratory Rate(breaths/min): 18 Photos: Wound Location: Right, Lateral Foot Right, Distal Foot Left, Medial, Plantar Foot Wounding Event: Pressure Injury Surgical Injury Pressure Injury Primary Etiology: Pressure Ulcer Open Surgical Wound Diabetic Wound/Ulcer of the Lower Extremity Comorbid History: Arrhythmia, Congestive Heart Arrhythmia, Congestive Heart Arrhythmia, Congestive Heart Failure, Coronary Artery Disease, Failure, Coronary Artery Disease, Failure, Coronary Artery Disease, Hypertension, Myocardial Infarction, Hypertension, Myocardial Infarction, Hypertension, Myocardial Infarction, Peripheral Venous Disease, Type II Peripheral Venous Disease, Type II Peripheral Venous Disease, Type II  Diabetes, Neuropathy Diabetes, Neuropathy Diabetes, Neuropathy Date Acquired: 02/20/2020 04/20/2020 04/27/2020 Weeks of Treatment: 1 1 1  Wound Status: Open Open Open Measurements L x W x D (cm) 2.7x2.1x0.6 5.5x1.5x1.6 0x0x0 Area (cm) : 4.453 6.48 0 Volume (cm) : 2.672 10.367 0 % Reduction in Area: 12.00% -37.50% 100.00% % Reduction in Volume: -5.70% -633.20% 100.00% Classification: Category/Stage IV Full Thickness Without Exposed Grade 1 Support Structures Exudate Amount: Medium Medium None Present Exudate Type: Serosanguineous Serosanguineous N/A Exudate Color: red, brown red, brown N/A Wound Margin: N/A N/A Flat and Intact Granulation Amount: None Present (0%) None Present (0%) None Present (0%) Necrotic Amount: Large (67-100%) Large (67-100%) None Present (0%) Exposed Structures: Fat Layer (Subcutaneous Tissue): Fat Layer (Subcutaneous Tissue): Fascia: No Yes Yes Fat Layer (Subcutaneous Tissue): Joint: Yes Fascia: No No Fascia: No Tendon: No Tendon: No Tendon: No Muscle: No Muscle: No Muscle: No Joint: No Joint: No Bone: No Bone: No Bone:  No Epithelialization: None None Large (67-100%) Wound Number: 4 N/A N/A Photos: N/A N/A [name] ([id number]) Wound Location: Left, Lateral, Plantar Foot N/A N/A Wounding Event: Pressure Injury N/A N/A Primary Etiology: Diabetic Wound/Ulcer of the Lower N/A N/A Extremity Comorbid History: Arrhythmia, Congestive Heart N/A N/A Failure, Coronary Artery Disease, Hypertension, Myocardial Infarction, Peripheral Venous Disease, Type II Diabetes, Neuropathy Date Acquired: 04/27/2020 N/A N/A Weeks of Treatment: 1 N/A N/A Wound Status: Open N/A N/A Measurements L x W x D (cm) 0x0x0 N/A N/A Area (cm) : 0 N/A N/A Volume (cm) : 0 N/A N/A % Reduction in Area: 100.00% N/A N/A % Reduction in Volume: 100.00% N/A N/A Classification: Grade 1 N/A N/A Exudate Amount: None Present N/A N/A Exudate Type: N/A N/A N/A Exudate Color: N/A N/A N/A Wound Margin: Flat and Intact N/A N/A Granulation Amount: None Present (0%) N/A N/A Necrotic Amount: None Present (0%) N/A N/A Exposed Structures: Fascia: No N/A N/A Fat Layer (Subcutaneous Tissue): No Tendon: No Muscle: No Joint: No Bone: No Epithelialization: None N/A N/A Treatment Notes Electronic Signature(s) Signed: 05/09/2020 5:31:50 PM By: Gretta Cool, BSN, RN, CWS, Kim RN, BSN Entered By: Gretta Cool, BSN, RN, CWS, Kim on 05/09/2020 09:30:56 Ralph Peterson ([id number]) -------------------------------------------------------------------------------- [place] Details Patient Name: Ralph Peterson Date of Service: 05/09/2020 9:00 AM Medical Record Number: [id number] Patient Account Number: [id number] Date of Birth/Sex: 02-03-1966 (54 y.o. M) Treating RN: Cornell Barman Primary Care Saja Bartolini: Vidal Schwalbe Other Clinician: Referring Nelva Hauk: Vidal Schwalbe Treating Shaakira Borrero/Extender: Melburn Hake, HOYT Weeks in Treatment: 1 Active Inactive Necrotic Tissue Nursing Diagnoses: Impaired tissue integrity related to  necrotic/devitalized tissue Knowledge deficit related to management of necrotic/devitalized tissue Goals: Necrotic/devitalized tissue will be minimized in the wound bed Date Initiated: 05/09/2020 Target Resolution Date: 05/09/2020 Goal Status: Active Interventions: Assess patient pain level pre-, during and post procedure and prior to discharge Provide education on necrotic tissue and debridement process Treatment Activities: Enzymatic debridement : 05/09/2020 Notes: Orientation to the Wound Care Program Nursing Diagnoses: Knowledge deficit related to the wound healing center program Goals: Patient/caregiver will verbalize understanding of the [place] Date Initiated: 04/27/2020 Target Resolution Date: 05/18/2020 Goal Status: Active Interventions: Provide education on orientation to the wound center Notes: Wound/Skin Impairment Nursing Diagnoses: Impaired tissue integrity Knowledge deficit related to ulceration/compromised skin integrity Goals: Patient/caregiver will verbalize understanding of skin care regimen Date Initiated: 04/27/2020 Target Resolution Date: 05/25/2020 Goal Status: Active Interventions: Assess patient/caregiver ability to obtain necessary supplies Assess patient/caregiver ability to perform ulcer/skin care regimen upon admission and as needed Assess  ulceration(s) every visit Provide education on ulcer and skin care Treatment Activities: Skin care regimen initiated : 04/27/2020 BARNABY, RIPPEON ([id number]) Topical wound management initiated : 04/27/2020 Notes: Electronic Signature(s) Signed: 05/09/2020 5:31:50 PM By: Gretta Cool, BSN, RN, CWS, Kim RN, BSN Entered By: Gretta Cool, BSN, RN, CWS, Kim on 05/09/2020 09:30:33 Ralph Peterson ([id number]) -------------------------------------------------------------------------------- Pain Assessment Details Patient Name: Ralph Peterson Date of Service: 05/09/2020 9:00 AM Medical  Record Number: [id number] Patient Account Number: [id number] Date of Birth/Sex: 02-09-1966 (54 y.o. M) Treating RN: Carlene Coria Primary Care Lum Stillinger: Vidal Schwalbe Other Clinician: Referring Amaal Dimartino: Vidal Schwalbe Treating Quang Thorpe/Extender: Melburn Hake, HOYT Weeks in Treatment: 1 Active Problems Location of Pain Severity and Description of Pain Patient Has Paino No Site Locations Pain Management and Medication Current Pain Management: Electronic Signature(s) Signed: 05/10/2020 4:54:21 PM By: Carlene Coria RN Entered By: Carlene Coria on 05/09/2020 09:11:03 Ralph Peterson ([id number]) -------------------------------------------------------------------------------- Patient/Caregiver Education Details Patient Name: Ralph Peterson Date of Service: 05/09/2020 9:00 AM Medical Record Number: [id number] Patient Account Number: [id number] Date of Birth/Gender: Jan 10, 1966 (54 y.o. M) Treating RN: Cornell Barman Primary Care Physician: Vidal Schwalbe Other Clinician: Referring Physician: Vidal Schwalbe Treating Physician/Extender: Sharalyn Ink in Treatment: 1 Education Assessment Education Provided To: Patient Education Topics Provided Wound/Skin Impairment: Handouts: Caring for Your Ulcer Methods: Demonstration, Explain/Verbal Responses: State content correctly Electronic Signature(s) Signed: 05/09/2020 5:31:50 PM By: Gretta Cool, BSN, RN, CWS, Kim RN, BSN Entered By: Gretta Cool, BSN, RN, CWS, Kim on 05/09/2020 09:36:17 Ralph Peterson ([id number]) -------------------------------------------------------------------------------- Wound Assessment Details Patient Name: Ralph Peterson. Date of Service: 05/09/2020 9:00 AM Medical Record Number: [id number] Patient Account Number: [id number] Date of Birth/Sex: 1966-01-09 (54 y.o. M) Treating RN: Carlene Coria Primary Care Genesi Stefanko: Vidal Schwalbe Other Clinician: Referring Nadine Ryle: Vidal Schwalbe Treating  Eyvonne Burchfield/Extender: STONE III, HOYT Weeks in Treatment: 1 Wound Status Wound Number: 1 Primary Pressure Ulcer Etiology: Wound Location: Right, Lateral Foot Wound Open Wounding Event: Pressure Injury Status: Date Acquired: 02/20/2020 Comorbid Arrhythmia, Congestive Heart Failure, Coronary Artery Weeks Of Treatment: 1 History: Disease, Hypertension, Myocardial Infarction, Peripheral Clustered Wound: No Venous Disease, Type II Diabetes, Neuropathy Photos Wound Measurements Length: (cm) 2.7 % R Width: (cm) 2.1 % R Depth: (cm) 0.6 Epi Area: (cm) 4.453 Tu Volume: (cm) 2.672 Un eduction in Area: 12% eduction in Volume: -5.7% thelialization: None nneling: No dermining: No Wound Description Classification: Category/Stage IV Fou Exudate Amount: Medium Slo Exudate Type: Serosanguineous Exudate Color: red, brown l Odor After Cleansing: No ugh/Fibrino Yes Wound Bed Granulation Amount: None Present (0%) Exposed Structure Necrotic Amount: Large (67-100%) Fascia Exposed: No Necrotic Quality: Adherent Slough Fat Layer (Subcutaneous Tissue) Exposed: Yes Tendon Exposed: No Muscle Exposed: No Joint Exposed: Yes Bone Exposed: No Treatment Notes Wound #1 (Right, Lateral Foot) Notes Santyl lateral, silver alg, abb, gauze. Electronic Signature(s) Signed: 05/10/2020 4:54:21 PM By: Carlene Coria RN Ralph Peterson ([id number]) Entered By: Carlene Coria on 05/09/2020 09:14:44 Ralph Peterson ([id number]) -------------------------------------------------------------------------------- Wound Assessment Details Patient Name: Ralph Peterson Date of Service: 05/09/2020 9:00 AM Medical Record Number: [id number] Patient Account Number: [id number] Date of Birth/Sex: 07-05-1966 (54 y.o. M) Treating RN: Carlene Coria Primary Care Belma Dyches: Vidal Schwalbe Other Clinician: Referring Teion Ballin: Vidal Schwalbe Treating Sherril Heyward/Extender: STONE III, HOYT Weeks in Treatment: 1 Wound  Status Wound Number: 2 Primary Open Surgical Wound Etiology: Wound Location: Right, Distal Foot Wound Open Wounding Event: Surgical Injury Status: Date Acquired: 04/20/2020 Comorbid Arrhythmia, Congestive Heart Failure, Coronary Artery Weeks Of Treatment: 1 History: Disease, Hypertension,  Myocardial Infarction, Peripheral Clustered Wound: No Venous Disease, Type II Diabetes, Neuropathy Photos Wound Measurements Length: (cm) 5.5 Width: (cm) 1.5 Depth: (cm) 1.6 Area: (cm) 6.48 Volume: (cm) 10.367 % Reduction in Area: -37.5% % Reduction in Volume: -633.2% Epithelialization: None Tunneling: No Undermining: No Wound Description Classification: Full Thickness Without Exposed Support Struct Exudate Amount: Medium Exudate Type: Serosanguineous Exudate Color: red, brown ures Foul Odor After Cleansing: No Slough/Fibrino No Wound Bed Granulation Amount: None Present (0%) Exposed Structure Necrotic Amount: Large (67-100%) Fascia Exposed: No Necrotic Quality: Adherent Slough Fat Layer (Subcutaneous Tissue) Exposed: Yes Tendon Exposed: No Muscle Exposed: No Joint Exposed: No Bone Exposed: No Treatment Notes Wound #2 (Right, Distal Foot) Notes Santyl lateral, silver alg, abb, gauze. Electronic Signature(s) Signed: 05/10/2020 4:54:21 PM By: Carlene Coria RN Ralph Peterson ([id number]) Entered By: Carlene Coria on 05/09/2020 09:15:41 Ralph Peterson ([id number]) -------------------------------------------------------------------------------- Wound Assessment Details Patient Name: Ralph Peterson Date of Service: 05/09/2020 9:00 AM Medical Record Number: [id number] Patient Account Number: [id number] Date of Birth/Sex: 02-25-1966 (54 y.o. M) Treating RN: Carlene Coria Primary Care Jamarrius Salay: Vidal Schwalbe Other Clinician: Referring Rejeana Fadness: Vidal Schwalbe Treating Zhion Pevehouse/Extender: STONE III, HOYT Weeks in Treatment: 1 Wound Status Wound Number: 3 Primary  Diabetic Wound/Ulcer of the Lower Extremity Etiology: Wound Location: Left, Medial, Plantar Foot Wound Open Wounding Event: Pressure Injury Status: Date Acquired: 04/27/2020 Comorbid Arrhythmia, Congestive Heart Failure, Coronary Artery Weeks Of Treatment: 1 History: Disease, Hypertension, Myocardial Infarction, Peripheral Clustered Wound: No Venous Disease, Type II Diabetes, Neuropathy Photos Wound Measurements Length: (cm) 0 Width: (cm) 0 Depth: (cm) 0 Area: (cm) 0 Volume: (cm) 0 % Reduction in Area: 100% % Reduction in Volume: 100% Epithelialization: Large (67-100%) Tunneling: No Undermining: No Wound Description Classification: Grade 1 Wound Margin: Flat and Intact Exudate Amount: None Present Foul Odor After Cleansing: No Slough/Fibrino No Wound Bed Granulation Amount: None Present (0%) Exposed Structure Necrotic Amount: None Present (0%) Fascia Exposed: No Fat Layer (Subcutaneous Tissue) Exposed: No Tendon Exposed: No Muscle Exposed: No Joint Exposed: No Bone Exposed: No Electronic Signature(s) Signed: 05/10/2020 4:54:21 PM By: Carlene Coria RN Entered By: Carlene Coria on 05/09/2020 09:16:34 Ralph Peterson ([id number]) -------------------------------------------------------------------------------- Wound Assessment Details Patient Name: Ralph Peterson. Date of Service: 05/09/2020 9:00 AM Medical Record Number: [id number] Patient Account Number: [id number] Date of Birth/Sex: 1965/09/30 (54 y.o. M) Treating RN: Carlene Coria Primary Care Dhilan Brauer: Vidal Schwalbe Other Clinician: Referring Jayden Kratochvil: Vidal Schwalbe Treating Clytee Heinrich/Extender: STONE III, HOYT Weeks in Treatment: 1 Wound Status Wound Number: 4 Primary Diabetic Wound/Ulcer of the Lower Extremity Etiology: Wound Location: Left, Lateral, Plantar Foot Wound Open Wounding Event: Pressure Injury Status: Date Acquired: 04/27/2020 Comorbid Arrhythmia, Congestive Heart Failure, Coronary  Artery Weeks Of Treatment: 1 History: Disease, Hypertension, Myocardial Infarction, Peripheral Clustered Wound: No Venous Disease, Type II Diabetes, Neuropathy Photos Wound Measurements Length: (cm) 0 Width: (cm) 0 Depth: (cm) 0 Area: (cm) 0 Volume: (cm) 0 % Reduction in Area: 100% % Reduction in Volume: 100% Epithelialization: None Tunneling: No Undermining: No Wound Description Classification: Grade 1 Wound Margin: Flat and Intact Exudate Amount: None Present Foul Odor After Cleansing: No Slough/Fibrino No Wound Bed Granulation Amount: None Present (0%) Exposed Structure Necrotic Amount: None Present (0%) Fascia Exposed: No Fat Layer (Subcutaneous Tissue) Exposed: No Tendon Exposed: No Muscle Exposed: No Joint Exposed: No Bone Exposed: No Electronic Signature(s) Signed: 05/09/2020 5:31:50 PM By: Gretta Cool, BSN, RN, CWS, Kim RN, BSN Signed: 05/10/2020 4:54:21 PM By: Carlene Coria RN Entered By: Gretta Cool, BSN, RN, CWS,  Kim on 05/09/2020 09:28:41 Ralph Peterson ([id number]) -------------------------------------------------------------------------------- Vitals Details Patient Name: BRYNE, LINDON. Date of Service: 05/09/2020 9:00 AM Medical Record Number: [id number] Patient Account Number: [id number] Date of Birth/Sex: 01/24/1966 (54 y.o. M) Treating RN: Carlene Coria Primary Care Corry Storie: Vidal Schwalbe Other Clinician: Referring Pamela Intrieri: Vidal Schwalbe Treating Zaakirah Kistner/Extender: STONE III, HOYT Weeks in Treatment: 1 Vital Signs Time Taken: 08:57 Temperature (F): 98.3 Height (in): 72 Pulse (bpm): 65 Weight (lbs): 225 Respiratory Rate (breaths/min): 18 Body Mass Index (BMI): 30.5 Blood Pressure (mmHg): 128/84 Reference Range: 80 - 120 mg / dl Electronic Signature(s) Signed: 05/10/2020 4:54:21 PM By: Carlene Coria RN Entered By: Carlene Coria on 05/09/2020 09:00:55

## 2020-05-11 ENCOUNTER — Encounter: Payer: Medicare PPO | Admitting: Podiatry

## 2020-05-16 ENCOUNTER — Encounter: Payer: Medicare PPO | Admitting: Podiatry

## 2020-05-18 ENCOUNTER — Encounter: Payer: Medicare PPO | Admitting: Physician Assistant

## 2020-05-18 ENCOUNTER — Other Ambulatory Visit: Payer: Self-pay

## 2020-05-18 DIAGNOSIS — E11621 Type 2 diabetes mellitus with foot ulcer: Secondary | ICD-10-CM | POA: Diagnosis not present

## 2020-05-18 NOTE — Progress Notes (Addendum)
AMEET, SANDY ([id number]) Visit Report for 05/18/2020 Arrival Information Details Patient Name: Ralph Peterson, Ralph Peterson. Date of Service: 05/18/2020 11:00 AM Medical Record Number: [id number] Patient Account Number: [id number] Date of Birth/Sex: 12-03-1965 (54 y.o. M) Treating RN: Dolan Amen Primary Care Ahmoni Edge: Vidal Schwalbe Other Clinician: Referring Auria Mckinlay: Vidal Schwalbe Treating Harles Evetts/Extender: Skipper Cliche in Treatment: 3 Visit Information History Since Last Visit Pain Present Now: No Patient Arrived: Wheel Chair Arrival Time: 11:03 Accompanied By: son Transfer Assistance: None Patient Identification Verified: Yes Secondary Verification Process Completed: Yes Patient Requires Transmission-Based Precautions: No Patient Has Alerts: No Electronic Signature(s) Signed: 05/23/2020 8:59:48 AM By: Georges Mouse, Minus Breeding Entered By: Georges Mouse, Minus Breeding on 05/18/2020 11:03:51 Ralph Peterson ([id number]) -------------------------------------------------------------------------------- Encounter Discharge Information Details Patient Name: Ralph Peterson. Date of Service: 05/18/2020 11:00 AM Medical Record Number: [id number] Patient Account Number: [id number] Date of Birth/Sex: Jan 23, 1966 (54 y.o. M) Treating RN: Cornell Barman Primary Care Kreed Kauffman: Vidal Schwalbe Other Clinician: Referring Rachelle Edwards: Vidal Schwalbe Treating Kenedi Cilia/Extender: Skipper Cliche in Treatment: 3 Encounter Discharge Information Items Post Procedure Vitals Discharge Condition: Stable Unable to obtain vitals Reason: Patient request Ambulatory Status: Wheelchair Discharge Destination: Home Transportation: Private Auto Schedule Follow-up Appointment: Yes Clinical Summary of Care: Electronic Signature(s) Signed: 05/18/2020 5:25:08 PM By: Gretta Cool, BSN, RN, CWS, Kim RN, BSN Entered By: Gretta Cool, BSN, RN, CWS, Kim on 05/18/2020 11:36:58 Ralph Peterson  ([id number]) -------------------------------------------------------------------------------- Lower Extremity Assessment Details Patient Name: AVYON, HERENDEEN. Date of Service: 05/18/2020 11:00 AM Medical Record Number: [id number] Patient Account Number: [id number] Date of Birth/Sex: 1965-10-06 (54 y.o. M) Treating RN: Dolan Amen Primary Care Lashunda Greis: Vidal Schwalbe Other Clinician: Referring Jamillah Camilo: Vidal Schwalbe Treating Arda Daggs/Extender: Skipper Cliche in Treatment: 3 Vascular Assessment Pulses: Dorsalis Pedis Palpable: [Right:Yes] Electronic Signature(s) Signed: 05/23/2020 8:59:48 AM By: Georges Mouse, Minus Breeding Entered By: Georges Mouse, Minus Breeding on 05/18/2020 11:20:41 Ralph Peterson ([id number]) -------------------------------------------------------------------------------- Multi Wound Chart Details Patient Name: Ralph Peterson Date of Service: 05/18/2020 11:00 AM Medical Record Number: [id number] Patient Account Number: [id number] Date of Birth/Sex: [birth year] (54 y.o. M) Treating RN: Dolan Amen Primary Care Norris Brumbach: Vidal Schwalbe Other Clinician: Referring Toris Laverdiere: Vidal Schwalbe Treating Orry Sigl/Extender: Skipper Cliche in Treatment: 3 Vital Signs Height(in): 72 Pulse(bpm): 65 Weight(lbs): 225 Blood Pressure(mmHg): 147/96 Body Mass Index(BMI): 31 Temperature(F): 98 Respiratory Rate(breaths/min): 16 Photos: [1:No Photos] [2:No Photos] [5:No Photos] Wound Location: [1:Right, Lateral Foot] [2:Right, Distal Foot] [5:Right, Plantar Foot] Wounding Event: [1:Pressure Injury] [2:Surgical Injury] [5:Surgical Injury] Primary Etiology: [1:Pressure Ulcer] [2:Open Surgical Wound] [5:Diabetic Wound/Ulcer of the Lower Extremity] Date Acquired: [1:02/20/2020] [2:04/20/2020] [5:03/24/2020] Weeks of Treatment: [1:3] [2:3] [5:0] Wound Status: [1:Open] [2:Open] [5:Open] Measurements L x W x D (cm) [1:2.5x1.7x0.4] [2:4.2x1.3x0.8]  [5:1.7x0.5x0.5] Area (cm) : [1:3.338] [2:4.288] [5:0.668] Volume (cm) : [1:1.335] [2:3.431] [5:0.334] % Reduction in Area: [1:34.00%] [2:9.00%] [5:N/A] % Reduction in Volume: [1:47.20%] [2:-142.60%] [5:N/A] Classification: [1:Category/Stage IV] [2:Full Thickness Without Exposed Support Structures] [5:N/A] Treatment Notes Electronic Signature(s) Signed: 05/23/2020 8:59:48 AM By: Georges Mouse, Minus Breeding Entered By: Georges Mouse, Minus Breeding on 05/18/2020 11:21:23 Ralph Peterson ([id number]) -------------------------------------------------------------------------------- [place] Details Patient Name: Ralph Peterson Date of Service: 05/18/2020 11:00 AM Medical Record Number: [id number] Patient Account Number: [id number] Date of Birth/Sex: 1965/09/09 (54 y.o. M) Treating RN: Dolan Amen Primary Care Tyarra Nolton: Vidal Schwalbe Other Clinician: Referring Chino Sardo: Vidal Schwalbe Treating Dynasti Kerman/Extender: Skipper Cliche in Treatment: 3 Active Inactive Necrotic Tissue Nursing Diagnoses: Impaired tissue integrity related to necrotic/devitalized tissue Knowledge deficit related to management of necrotic/devitalized tissue Goals: Necrotic/devitalized  tissue will be minimized in the wound bed Date Initiated: 05/09/2020 Target Resolution Date: 05/09/2020 Goal Status: Active Interventions: Assess patient pain level pre-, during and post procedure and prior to discharge Provide education on necrotic tissue and debridement process Treatment Activities: Enzymatic debridement : 05/09/2020 Notes: Orientation to the Wound Care Program Nursing Diagnoses: Knowledge deficit related to the wound healing center program Goals: Patient/caregiver will verbalize understanding of the [facility] Date Initiated: 04/27/2020 Target Resolution Date: 05/18/2020 Goal Status: Active Interventions: Provide education on orientation to the wound  center Notes: Wound/Skin Impairment Nursing Diagnoses: Impaired tissue integrity Knowledge deficit related to ulceration/compromised skin integrity Goals: Patient/caregiver will verbalize understanding of skin care regimen Date Initiated: 04/27/2020 Target Resolution Date: 05/25/2020 Goal Status: Active Interventions: Assess patient/caregiver ability to obtain necessary supplies Assess patient/caregiver ability to perform ulcer/skin care regimen upon admission and as needed Assess ulceration(s) every visit Provide education on ulcer and skin care Treatment Activities: Skin care regimen initiated : 04/27/2020 Ralph Peterson, Ralph Peterson ([id number]) Topical wound management initiated : 04/27/2020 Notes: Electronic Signature(s) Signed: 05/23/2020 8:59:48 AM By: Georges Mouse, Minus Breeding Entered By: Georges Mouse, Minus Breeding on 05/18/2020 11:21:15 Ralph Peterson ([id number]) -------------------------------------------------------------------------------- Pain Assessment Details Patient Name: Ralph Peterson Date of Service: 05/18/2020 11:00 AM Medical Record Number: [id number] Patient Account Number: [id number] Date of Birth/Sex: 1965/07/26 (54 y.o. M) Treating RN: Dolan Amen Primary Care Niemah Schwebke: Vidal Schwalbe Other Clinician: Referring Gareth Fitzner: Vidal Schwalbe Treating Jakiyah Stepney/Extender: Skipper Cliche in Treatment: 3 Active Problems Location of Pain Severity and Description of Pain Patient Has Paino No Site Locations Rate the pain. Current Pain Level: 0 Pain Management and Medication Current Pain Management: Electronic Signature(s) Signed: 05/23/2020 8:59:48 AM By: Georges Mouse, Kenia Entered By: Georges Mouse, Minus Breeding on 05/18/2020 11:06:03 Ralph Peterson ([id number]) -------------------------------------------------------------------------------- Patient/Caregiver Education Details Patient Name: Ralph Peterson Date of Service: 05/18/2020 11:00  AM Medical Record Number: [id number] Patient Account Number: [id number] Date of Birth/Gender: Sep 14, 1965 (54 y.o. M) Treating RN: Cornell Barman Primary Care Physician: Vidal Schwalbe Other Clinician: Referring Physician: Vidal Schwalbe Treating Physician/Extender: Skipper Cliche in Treatment: 3 Education Assessment Education Provided To: Patient Education Topics Provided Wound Debridement: Handouts: Wound Debridement Methods: Demonstration, Explain/Verbal Responses: State content correctly Wound/Skin Impairment: Handouts: Caring for Your Ulcer, Other: wound care as prescribed Methods: Demonstration Responses: State content correctly Electronic Signature(s) Signed: 05/18/2020 5:25:08 PM By: Gretta Cool, BSN, RN, CWS, Kim RN, BSN Entered By: Gretta Cool, BSN, RN, CWS, Kim on 05/18/2020 11:35:43 Ralph Peterson ([id number]) -------------------------------------------------------------------------------- Wound Assessment Details Patient Name: Ralph Peterson. Date of Service: 05/18/2020 11:00 AM Medical Record Number: [id number] Patient Account Number: [id number] Date of Birth/Sex: [birth year] (54 y.o. M) Treating RN: Dolan Amen Primary Care Guenevere Roorda: Vidal Schwalbe Other Clinician: Referring Michaila Kenney: Vidal Schwalbe Treating Cartier Mapel/Extender: Skipper Cliche in Treatment: 3 Wound Status Wound Number: 1 Primary Pressure Ulcer Etiology: Wound Location: Right, Lateral Foot Wound Open Wounding Event: Pressure Injury Status: Date Acquired: 02/20/2020 Comorbid Arrhythmia, Congestive Heart Failure, Coronary Artery Weeks Of Treatment: 3 History: Disease, Hypertension, Myocardial Infarction, Peripheral Clustered Wound: No Venous Disease, Type II Diabetes, Neuropathy Photos Photo Uploaded By: Georges Mouse, Minus Breeding on 05/18/2020 11:51:03 Wound Measurements Length: (cm) 2.5 Width: (cm) 1.7 Depth: (cm) 0.4 Area: (cm) 3.338 Volume: (cm) 1.335 % Reduction in Area: 34% %  Reduction in Volume: 47.2% Epithelialization: None Wound Description Classification: Category/Stage IV Exudate Amount: Medium Exudate Type: Serosanguineous Exudate Color: red, brown Foul Odor After Cleansing: No Slough/Fibrino Yes Wound Bed Granulation Amount: None Present (0%)  Exposed Structure Necrotic Amount: Large (67-100%) Fascia Exposed: No Necrotic Quality: Adherent Slough Fat Layer (Subcutaneous Tissue) Exposed: Yes Tendon Exposed: Yes Muscle Exposed: No Joint Exposed: Yes Bone Exposed: No Treatment Notes Wound #1 (Right, Lateral Foot) Notes Santyl lateral, silver alg, abb, gauze. Electronic Signature(s) Ralph Peterson, Ralph Peterson ([id number]) Signed: 05/18/2020 5:25:08 PM By: Gretta Cool BSN, RN, CWS, Kim RN, BSN Signed: 05/23/2020 8:59:48 AM By: Georges Mouse, Ron Agee By: Gretta Cool BSN, RN, CWS, Kim on 05/18/2020 11:30:13 Ralph Peterson ([id number]) -------------------------------------------------------------------------------- Wound Assessment Details Patient Name: Ralph Peterson, Ralph Peterson. Date of Service: 05/18/2020 11:00 AM Medical Record Number: [id number] Patient Account Number: [id number] Date of Birth/Sex: 1965-10-10 (54 y.o. M) Treating RN: Dolan Amen Primary Care Cliffton Spradley: Vidal Schwalbe Other Clinician: Referring Ohm Dentler: Vidal Schwalbe Treating Inara Dike/Extender: Skipper Cliche in Treatment: 3 Wound Status Wound Number: 2 Primary Etiology: Open Surgical Wound Wound Location: Right, Distal Foot Wound Status: Open Wounding Event: Surgical Injury Date Acquired: 04/20/2020 Weeks Of Treatment: 3 Clustered Wound: No Photos Photo Uploaded By: Georges Mouse, Minus Breeding on 05/18/2020 11:51:04 Wound Measurements Length: (cm) 4.2 Width: (cm) 1.3 Depth: (cm) 0.8 Area: (cm) 4.288 Volume: (cm) 3.431 % Reduction in Area: 9% % Reduction in Volume: -142.6% Wound Description Classification: Full Thickness Without Exposed Support  Structu res Electronic Signature(s) Signed: 05/23/2020 8:59:48 AM By: Georges Mouse, Minus Breeding Entered By: Georges Mouse, Minus Breeding on 05/18/2020 11:20:13 Ralph Peterson ([id number]) -------------------------------------------------------------------------------- Wound Assessment Details Patient Name: Ralph Peterson. Date of Service: 05/18/2020 11:00 AM Medical Record Number: [id number] Patient Account Number: [id number] Date of Birth/Sex: 04/18/1966 (54 y.o. M) Treating RN: Dolan Amen Primary Care Jaecob Lowden: Vidal Schwalbe Other Clinician: Referring Labarron Durnin: Vidal Schwalbe Treating Renly Roots/Extender: Skipper Cliche in Treatment: 3 Wound Status Wound Number: 5 Primary Etiology: Diabetic Wound/Ulcer of the Lower Extremity Wound Location: Right, Plantar Foot Wound Status: Open Wounding Event: Surgical Injury Date Acquired: 03/24/2020 Weeks Of Treatment: 0 Clustered Wound: No Photos Photo Uploaded By: Georges Mouse, Minus Breeding on 05/18/2020 11:51:04 Wound Measurements Length: (cm) Width: (cm) Depth: (cm) Area: (cm) Volume: (cm) 1.7 % Reduction in Area: 0.5 % Reduction in Volume: 0.5 0.668 0.334 Treatment Notes Wound #5 (Right, Plantar Foot) Notes Santyl lateral, silver alg, abb, gauze. Electronic Signature(s) Signed: 05/23/2020 8:59:48 AM By: Georges Mouse, Minus Breeding Entered By: Georges Mouse, Minus Breeding on 05/18/2020 11:18:53 Ralph Peterson ([id number]) -------------------------------------------------------------------------------- Vitals Details Patient Name: Ralph Peterson Date of Service: 05/18/2020 11:00 AM Medical Record Number: [id number] Patient Account Number: [id number] Date of Birth/Sex: 16-Jan-1966 (54 y.o. M) Treating RN: Dolan Amen Primary Care Lakya Schrupp: Vidal Schwalbe Other Clinician: Referring Vega Stare: Vidal Schwalbe Treating Naisha Wisdom/Extender: Skipper Cliche in Treatment: 3 Vital Signs Time Taken: 11:04 Temperature  (F): 98 Height (in): 72 Pulse (bpm): 66 Weight (lbs): 225 Respiratory Rate (breaths/min): 16 Body Mass Index (BMI): 30.5 Blood Pressure (mmHg): 147/96 Reference Range: 80 - 120 mg / dl Electronic Signature(s) Signed: 05/23/2020 8:59:48 AM By: Georges Mouse, Minus Breeding Entered By: Georges Mouse, Minus Breeding on 05/18/2020 11:05:54

## 2020-05-18 NOTE — Progress Notes (Addendum)
USHER, HEDBERG ([id number]) Visit Report for 05/18/2020 Chief Complaint Document Details Patient Name: Ralph Peterson, Ralph Peterson. Date of Service: 05/18/2020 11:00 AM Medical Record Number: [id number] Patient Account Number: [id number] Date of Birth/Sex: 03-11-1966 (54 y.o. 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 M) Treating RN: Cornell Barman Primary Care Provider: Vidal Schwalbe Other Clinician: Referring Provider: Vidal Schwalbe Treating Provider/Extender: Skipper Cliche in Treatment: 3 Debridement Performed for Wound #2 Right,Dorsal Foot Assessment: Performed By: Physician Tommie Sams., PA-C Debridement Type: Debridement Level of Consciousness (Pre- Awake and Alert procedure): Pre-procedure Verification/Time Out Yes - 11:30 Taken: Total Area Debrided (L x W): 4.2 (cm) x 1.3 (cm) = 5.46 (cm) Tissue and other material Slough, Subcutaneous, Slough debrided: Level: Skin/Subcutaneous Tissue Debridement Description: Excisional Instrument: Curette Bleeding: Minimum Hemostasis Achieved: Pressure Response to Treatment: Procedure was tolerated well Level of Consciousness (Post- Awake and Alert procedure): Post Debridement Measurements of Total Wound Length:  (cm) 4.2 Width: (cm) 1.3 Depth: (cm) 0.8 Volume: (cm) 3.431 Character of Wound/Ulcer Post Debridement: Stable Post Procedure Diagnosis Same as Pre-procedure Electronic Signature(s) Signed: 05/18/2020 5:02:21 PM By: Worthy Keeler PA-C Signed: 05/18/2020 5:25:08 PM By: Gretta Cool, BSN, RN, CWS, Kim RN, BSN Entered By: Gretta Cool, BSN, RN, CWS, Kim on 05/18/2020 11:32:35 Ralph Peterson ([id number]) -------------------------------------------------------------------------------- Debridement Details Patient Name: Ralph Peterson. Date of Service: 05/18/2020 11:00 AM Medical Record Number: [id number] Patient Account Number: [id number] Date of Birth/Sex: [birth year] (54 y.o. M) Treating RN: Cornell Barman Primary Care Provider: Vidal Schwalbe Other Clinician: Referring Provider: Vidal Schwalbe Treating Provider/Extender: Skipper Cliche in Treatment: 3 Debridement Performed for Wound #1 Right,Lateral Foot Assessment: Performed By: Physician Tommie Sams., PA-C Debridement Type: Debridement Level of Consciousness (Pre- Awake and Alert procedure): Pre-procedure Verification/Time Out Yes - 11:30 Taken: Total Area Debrided (L x W): 2.5 (cm) x 1.7 (cm) = 4.25 (cm) Tissue and other material Slough, Subcutaneous, Slough debrided: Level: Skin/Subcutaneous Tissue Debridement Description: Excisional Instrument: Curette Bleeding: Minimum Hemostasis Achieved: Pressure Response to Treatment: Procedure was tolerated well Level of Consciousness (Post- Awake and Alert procedure): Post Debridement Measurements of Total Wound Length: (cm) 2.5 Stage: Category/Stage IV Width: (cm) 1.7 Depth: (cm) 0.4 Volume: (cm) 1.335 Character of Wound/Ulcer Post Debridement: Stable Post Procedure Diagnosis Same as Pre-procedure Notes tendon exposed Electronic Signature(s) Signed: 05/18/2020 5:02:21 PM By: Worthy Keeler PA-C Signed: 05/18/2020 5:25:08 PM By: Gretta Cool, BSN, RN, CWS, Kim RN,  BSN Entered By: Gretta Cool, BSN, RN, CWS, Kim on 05/18/2020 11:32:51 Ralph Peterson ([id number]) -------------------------------------------------------------------------------- HPI Details Patient Name: Ralph Peterson. Date of Service: 05/18/2020 11:00 AM Medical Record Number: [id number] Patient Account Number: [id number] Date of Birth/Sex: [birth year] (54 y.o. M) Treating RN: Cornell Barman Primary Care Provider: Vidal Schwalbe Other Clinician: Referring Provider: Vidal Schwalbe Treating Provider/Extender: Skipper Cliche in Treatment: 3 History of Present Illness HPI Description: He has been having with multiple ulcers of his feet. He does have a couple callus areas that have ulcerations underneath on the left foot. 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 Fortunately there is no sign of active infection at this time which is good news he has been using Telfa pads after applying Betadine this is not really absorbing enough of the drainage however which is causing the area especially at the amputation site to be very macerated. The patient states that he actually has a work-up with Dr. Delana Meyer at [facility] next Thursday. For that reason we did not perform ABIs today and again I really am not doing any significant debridement today we will discontinue mild things to remove some of the necrotic debris so that we hopefully get these wounds moving in the proper direction. With that being said his extremities do appear to be warm without any signs obviously of significant arterial flow. The patient does have a history of diabetes mellitus type 2, congestive heart failure, end- stage renal disease, and dependence on renal dialysis. He notes that he did have an exacerbation of heart failure recently which caused his legs to swell he had a couple wounds on  his left leg but fortunately these have actually improved and are healed as of today. 05/09/2020 on evaluation today patient appears to be doing decently well all things considered in regard to his wounds. He does have the sutures out at this point in regard to his right amputation site. That does seem to be showing signs of improvement which is good news from the standpoint of at least being able to get some of the slough out although he does have quite a bit of slough buildup on the surface of the wound here. I think that is can require debridement before were able to see dramatic filling in. He may even require wound VAC at the site. With regard to the lateral foot this seems to be loosening up with the Santyl and I am pleased in that regard. In regard to the left foot everything appears to be healed and is doing great. 05/18/2020 on evaluation today patient appears to be doing well at this time in regard to his wounds. 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 There is no signs of active infection at this time. Electronic Signature(s) Signed: 05/18/2020 1:00:10 PM By: Worthy Keeler PA-C Entered By: Worthy Keeler on 05/18/2020 13:00:09 Ralph Peterson ([id number]) -------------------------------------------------------------------------------- Physical Exam Details Patient Name: Ralph Peterson Date of Service: 05/18/2020 11:00 AM Medical Record Number: [id number] Patient Account Number: [id number] Date of Birth/Sex: 1965/12/24 (54 y.o. M) Treating RN: Cornell Barman Primary Care Provider: Vidal Schwalbe Other Clinician: Referring Provider: Vidal Schwalbe Treating Provider/Extender: Skipper Cliche in Treatment: 3 Constitutional Well-nourished  and well-hydrated in no acute distress. Respiratory normal breathing without difficulty. Psychiatric this patient is able to make decisions and demonstrates good insight into disease process. Alert and Oriented x 3. pleasant and cooperative. Notes Patient's wound bed currently showed signs of good granulation there does not appear to be any evidence of active infection although there was some slough buildup I did have to perform debridement on the lateral portion of the foot as well as the dorsal surface of the  wound. I still believe the Santyl on the lateral portion is going to be beneficial for him to continue at this point. Electronic Signature(s) Signed: 05/18/2020 1:00:38 PM By: Worthy Keeler PA-C Entered By: Worthy Keeler on 05/18/2020 13:00:37 Ralph Peterson ([id number]) -------------------------------------------------------------------------------- Physician Orders Details Patient Name: Ralph Peterson Date of Service: 05/18/2020 11:00 AM Medical Record Number: [id number] Patient Account Number: [id number] Date of Birth/Sex: Jan 30, 1966 (54 y.o. M) Treating RN: Cornell Barman Primary Care Provider: Vidal Schwalbe Other Clinician: Referring Provider: Vidal Schwalbe Treating Provider/Extender: Skipper Cliche in Treatment: 3 Verbal / Phone Orders: No Diagnosis Coding ICD-10 Coding Code Description [phone] Pressure ulcer of other site, stage 3 T81.31XA Disruption of external operation (surgical) wound, not elsewhere classified, initial encounter 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d L97.522 Non-pressure chronic ulcer of other part of left foot with fat layer exposed E11.621 Type 2 diabetes mellitus with foot ulcer I50.42 Chronic combined systolic (congestive) and diastolic (congestive) heart failure N18.6 End stage renal disease Z99.2 Dependence on renal dialysis Wound Cleansing Wound #1 Right,Lateral Foot o  Clean wound with Normal Saline. Wound #2 Right,Dorsal Foot o Clean wound with Normal Saline. Wound #5 Right,Plantar Foot o Clean wound with Normal Saline. Anesthetic (add to Medication List) Wound #1 Right,Lateral Foot o Topical Lidocaine 4% cream applied to wound bed prior to debridement (In Clinic Only). Wound #2 Right,Dorsal Foot o Topical Lidocaine 4% cream applied to wound bed prior to debridement (In Clinic Only). Wound #5 Right,Plantar Foot o Topical Lidocaine 4% cream applied to wound bed prior to debridement (In Clinic Only). Primary Wound Dressing Wound #1 Right,Lateral Foot o Santyl Ointment o Silver Alginate Wound #2 Right,Dorsal Foot o Silver Alginate Wound #5 Right,Plantar Foot o Silver Alginate Secondary Dressing Wound #1 Right,Lateral Foot o ABD and Kerlix/Conform o Other - stretchnet Wound #2 Right,Dorsal Foot o ABD and Kerlix/Conform o Other - stretchnet Wound #5 Right,Plantar Foot o ABD and Kerlix/Conform o Other - stretchnet Tully, Harshil S. ([id number]) Dressing Change Frequency Wound #1 Right,Lateral Foot o Change dressing every day. Wound #2 Right,Dorsal Foot o Change dressing every day. Wound #5 Right,Plantar Foot o Change dressing every day. Follow-up Appointments Wound #1 Right,Lateral Foot o Return Appointment in 1 week. Wound #2 Right,Dorsal Foot o Return Appointment in 1 week. Wound #5 Right,Plantar Foot o Return Appointment in 1 week. Edema Control Wound #1 Right,Lateral Foot o Elevate legs to the level of the heart and pump ankles as often as possible Wound #2 Right,Dorsal Foot o Elevate legs to the level of the heart and pump ankles as often as possible Wound #5 Right,Plantar Foot o Elevate legs to the level of the heart and pump ankles as often as possible Off-Loading Wound #1 Right,Lateral Foot o Other: - front off-loader right Wound #2 Right,Dorsal Foot o Other: - front  off-loader right Wound #5 Right,Plantar Foot o Other: - front off-loader right Additional Orders / Instructions Wound #1 Right,Lateral Foot o Increase protein intake. Wound #2 Right,Dorsal Foot o Increase protein intake. Wound #5 Right,Plantar Foot o Increase protein intake. Electronic Signature(s) Signed: 05/18/2020 5:02:21 PM By: Worthy Keeler PA-C Signed: 05/18/2020 5:25:08 PM By: Gretta Cool, BSN, RN, CWS, Kim RN, BSN Entered By: Gretta Cool, BSN, RN, CWS, Kim on 05/18/2020 11:34:48 Ralph Peterson ([id number]) -------------------------------------------------------------------------------- Problem List Details Patient Name: DANISH, RUFFINS. Date of Service: 05/18/2020 11:00 AM Medical Record Number: [id number] Patient Account Number: [id number] Date of Birth/Sex: 04-09-1966 (54 y.o. M)  Treating RN: Cornell Barman Primary Care Provider: Vidal Schwalbe Other Clinician: Referring Provider: Vidal Schwalbe Treating Provider/Extender: Skipper Cliche in Treatment: 3 Active Problems ICD-10 Encounter Code Description Active Date MDM Diagnosis L89.893 Pressure ulcer of other site, stage 3 04/27/2020 No Yes T81.31XA Disruption of external operation (surgical) wound, not elsewhere 04/27/2020 No Yes classified, initial encounter L97.512 Non-pressure chronic ulcer of other part of right foot with fat layer 04/27/2020 No Yes exposed L97.522 Non-pressure chronic ulcer of other part of left foot with fat layer 04/27/2020 No Yes exposed E11.621 Type 2 diabetes mellitus with foot ulcer 04/27/2020 No Yes I50.42 Chronic combined systolic (congestive) and diastolic (congestive) heart 04/27/2020 No Yes failure N18.6 End stage renal disease 04/27/2020 No Yes Z99.2 Dependence on renal dialysis 04/27/2020 No Yes Inactive Problems Resolved Problems Electronic Signature(s) Signed: 05/18/2020 10:58:15 AM By: Worthy Keeler PA-C Entered By: Worthy Keeler on 05/18/2020 10:58:15 Ralph Peterson ([id number]) -------------------------------------------------------------------------------- Progress Note Details Patient Name: Ralph Peterson. Date of Service: 05/18/2020 11:00 AM Medical Record Number: [id number] Patient Account Number: [id number] Date of Birth/Sex: 1965-09-08 (54 y.o. 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 He does have a couple callus areas that have ulcerations underneath on the left foot. 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 Fortunately there is no sign of active infection at this time which is good news he has been using Telfa pads after applying Betadine this is not really absorbing enough of the drainage however which is causing the area especially at the amputation site to be very macerated. The patient states that he actually has a work-up with Dr. Delana Meyer at vein [facility] vascular next Thursday. For that reason we did not perform ABIs today and again I really am not doing any significant debridement today we will discontinue mild things to remove some of the necrotic debris so that we hopefully get these wounds moving in the proper direction. With that being said his extremities do appear to be warm without any signs obviously of significant arterial flow. The patient does have a history of diabetes mellitus type 2, congestive heart failure, end- stage renal disease, and dependence on renal dialysis. He notes that he did have an exacerbation of heart  failure recently which caused his legs to swell he had a couple wounds on his left leg but fortunately these have actually improved and are healed as of today. 05/09/2020 on evaluation today patient appears to be doing decently well all things considered in regard to his wounds. He does have the sutures out at this point in regard to his right amputation site. That does seem to be showing signs of improvement which is good news from the standpoint of at least being able to get some of the slough out although he does have quite a bit of slough buildup on the surface of the wound here. I think that is can require debridement before were able to see dramatic filling in. He may even require wound VAC at the site. With regard to the lateral foot this  seems to be loosening up with the Santyl and I am pleased in that regard. In regard to the left foot everything appears to be healed and is doing great. 05/18/2020 on evaluation today patient appears to be doing well at this time in regard to his wounds. 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 There is no signs of active infection at this time. Objective Constitutional Well-nourished and well-hydrated in no acute distress. Vitals Time Taken: 11:04 AM, Height: 72 in, Weight: 225 lbs, BMI: 30.5, Temperature: 98 F, Pulse: 66 bpm, Respiratory Rate: 16 breaths/min, Blood Pressure: 147/96 mmHg. Respiratory normal breathing without difficulty. Psychiatric this patient is able to make decisions and demonstrates good insight into disease process. Alert and Oriented x 3. pleasant and cooperative. General Notes: Patient's wound bed currently showed signs of good granulation there does not appear to be any evidence of active  infection although there was some slough buildup I did have to perform debridement on the lateral portion of the foot as well as the dorsal surface of the wound. I still believe the Santyl on the lateral portion is going to be beneficial for him to continue at this point. Integumentary (Hair, Skin) Wound #1 status is Open. Original cause of wound was Pressure Injury. The wound is located on the Right,Lateral Foot. The wound measures 2.5cm length x 1.7cm width x 0.4cm depth; 3.338cm^2 area and 1.335cm^3 volume. There is joint, tendon, and Fat Layer (Subcutaneous Tissue) exposed. There is a medium amount of serosanguineous drainage noted. There is no granulation within the wound bed. There is a large (67-100%) amount of necrotic tissue within the wound bed including Adherent Slough. Wound #2 status is Open. Original cause of wound was Surgical Injury. The wound is located on the Right,Dorsal Foot. The wound measures 4.2cm length x 1.3cm width x 0.8cm depth; 4.288cm^2 area and 3.431cm^3 volume. CHANCELLOR, VANDERLOOP ([id number]) Wound #5 status is Open. Original cause of wound was Surgical Injury. The wound is located on the [place]. The wound measures 1.7cm length x 0.5cm width x 0.5cm depth; 0.668cm^2 area and 0.334cm^3 volume. Assessment Active Problems ICD-10 Pressure ulcer of other site, stage 3 Disruption of external operation (surgical) wound, not elsewhere classified, initial encounter Non-pressure chronic ulcer of other part of right foot with fat layer exposed Non-pressure chronic ulcer of other part of left foot with fat layer exposed Type 2 diabetes mellitus with foot ulcer Chronic combined systolic (congestive) and diastolic (congestive) heart failure End stage renal disease Dependence on renal dialysis Procedures Wound #1 Pre-procedure diagnosis of Wound #1 is a Pressure Ulcer located on the Right,Lateral Foot . There was a Excisional Skin/Subcutaneous  Tissue Debridement with a total area of 4.25 sq cm performed by Tommie Sams., PA-C. With the following instrument(s): Curette Material removed includes Subcutaneous Tissue and Slough and. No specimens were taken. A time out was conducted at 11:30, prior to the start of the procedure. A Minimum amount of bleeding was controlled with Pressure. The procedure was tolerated well. Post Debridement Measurements: 2.5cm length x 1.7cm width x 0.4cm depth; 1.335cm^3 volume. Post debridement Stage noted as Category/Stage IV. Character of Wound/Ulcer Post Debridement is stable. Post procedure Diagnosis Wound #1: Same as Pre-Procedure General Notes: tendon exposed. Wound #2 Pre-procedure diagnosis of  Wound #2 is an Open Surgical Wound located on the Right,Dorsal Foot . There was a Excisional Skin/Subcutaneous Tissue Debridement with a total area of 5.46 sq cm performed by Tommie Sams., PA-C. With the following instrument(s): Curette Material removed includes Subcutaneous Tissue and Slough and. No specimens were taken. A time out was conducted at 11:30, prior to the start of the procedure. A Minimum amount of bleeding was controlled with Pressure. The procedure was tolerated well. Post Debridement Measurements: 4.2cm length x 1.3cm width x 0.8cm depth; 3.431cm^3 volume. Character of Wound/Ulcer Post Debridement is stable. Post procedure Diagnosis Wound #2: Same as Pre-Procedure Plan Wound Cleansing: Wound #1 Right,Lateral Foot: Clean wound with Normal Saline. Wound #2 Right,Dorsal Foot: Clean wound with Normal Saline. Wound #5 Right,Plantar Foot: Clean wound with Normal Saline. Anesthetic (add to Medication List): Wound #1 Right,Lateral Foot: Topical Lidocaine 4% cream applied to wound bed prior to debridement (In Clinic Only). Wound #2 Right,Dorsal Foot: Topical Lidocaine 4% cream applied to wound bed prior to debridement (In Clinic Only). Wound #5 Right,Plantar Foot: Topical Lidocaine 4% cream  applied to wound bed prior to debridement (In Clinic Only). Primary Wound Dressing: Wound #1 Right,Lateral Foot: Santyl Ointment Silver Alginate Wound #2 Right,Dorsal Foot: Silver Alginate Wound #5 Right,Plantar Foot: ORLONDO, [place] ([id number]) Silver Alginate Secondary Dressing: Wound #1 Right,Lateral Foot: ABD and Kerlix/Conform Other - stretchnet Wound #2 Right,Dorsal Foot: ABD and Kerlix/Conform Other - stretchnet Wound #5 Right,Plantar Foot: ABD and Kerlix/Conform Other - stretchnet Dressing Change Frequency: Wound #1 Right,Lateral Foot: Change dressing every day. Wound #2 Right,Dorsal Foot: Change dressing every day. Wound #5 Right,Plantar Foot: Change dressing every day. Follow-up Appointments: Wound #1 Right,Lateral Foot: Return Appointment in 1 week. Wound #2 Right,Dorsal Foot: Return Appointment in 1 week. Wound #5 Right,Plantar Foot: Return Appointment in 1 week. Edema Control: Wound #1 Right,Lateral Foot: Elevate legs to the level of the heart and pump ankles as often as possible Wound #2 Right,Dorsal Foot: Elevate legs to the level of the heart and pump ankles as often as possible Wound #5 Right,Plantar Foot: Elevate legs to the level of the heart and pump ankles as often as possible Off-Loading: Wound #1 Right,Lateral Foot: Other: - front off-loader right Wound #2 Right,Dorsal Foot: Other: - front off-loader right Wound #5 Right,Plantar Foot: Other: - front off-loader right Additional Orders / Instructions: Wound #1 Right,Lateral Foot: Increase protein intake. Wound #2 Right,Dorsal Foot: Increase protein intake. Wound #5 Right,Plantar Foot: Increase protein intake. Seffner suggest currently that we continue with silver alginate across the board we will use Santyl on the lateral foot wound which I think is beneficial at this time. 2. I am also can recommend at this time that the patient continue to monitor for any signs of active  infection. Obviously I do not see anything right now but if they notice any redness or otherwise concerning findings or drainage they will let me know. 3. We discussed the possibility of a wound VAC but the patient is feeling so well and dorsal surface of his foot that I do not feel that even necessary at this point. We will see patient back for reevaluation in 1 week here in the clinic. If anything worsens or changes patient will contact our office for additional recommendations. Electronic Signature(s) Signed: 05/18/2020 1:01:18 PM By: Worthy Keeler PA-C Entered By: Worthy Keeler on 05/18/2020 13:01:18 Ralph Peterson ([id number]) -------------------------------------------------------------------------------- SuperBill Details Patient Name: Ralph Peterson Date of Service: 05/18/2020 Medical Record Number:  [id number] Patient Account Number: [id number] Date of Birth/Sex: 15-Jun-1966 (54 y.o. 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 End stage renal disease Z99.2 Dependence on renal dialysis Facility Procedures CPT4 Code: 97741423 Description: 11042 - DEB SUBQ TISSUE 20 SQ CM/< Modifier: Quantity: 1 CPT4 Code: Description: ICD-10 Diagnosis Description L89.893 Pressure ulcer of other site, stage 3 T81.31XA Disruption of external  operation (surgical) wound, not elsewhere classifie 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 Modifier: d, initial encounter sed Quantity: Physician Procedures CPT4 Code: 9532023 Description: 11042 - WC PHYS SUBQ TISS 20 SQ CM Modifier: Quantity: 1 CPT4 Code: Description: ICD-10 Diagnosis Description L89.893 Pressure ulcer of other site, stage 3 T81.31XA Disruption of external operation (surgical) wound, not elsewhere classifie 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 Modifier: d, initial encounter sed Quantity: Electronic Signature(s) Signed: 05/18/2020 1:01:38 PM By: Worthy Keeler PA-C Entered By: Worthy Keeler on 05/18/2020 13:01:37

## 2020-05-26 ENCOUNTER — Other Ambulatory Visit: Payer: Self-pay

## 2020-05-26 ENCOUNTER — Encounter: Payer: Medicare PPO | Attending: Physician Assistant | Admitting: Physician Assistant

## 2020-05-26 DIAGNOSIS — E1122 Type 2 diabetes mellitus with diabetic chronic kidney disease: Secondary | ICD-10-CM | POA: Diagnosis not present

## 2020-05-26 DIAGNOSIS — Z89421 Acquired absence of other right toe(s): Secondary | ICD-10-CM | POA: Insufficient documentation

## 2020-05-26 DIAGNOSIS — I5042 Chronic combined systolic (congestive) and diastolic (congestive) heart failure: Secondary | ICD-10-CM | POA: Insufficient documentation

## 2020-05-26 DIAGNOSIS — Z992 Dependence on renal dialysis: Secondary | ICD-10-CM | POA: Diagnosis not present

## 2020-05-26 DIAGNOSIS — L89893 Pressure ulcer of other site, stage 3: Secondary | ICD-10-CM | POA: Insufficient documentation

## 2020-05-26 DIAGNOSIS — L97512 Non-pressure chronic ulcer of other part of right foot with fat layer exposed: Secondary | ICD-10-CM | POA: Diagnosis not present

## 2020-05-26 DIAGNOSIS — T8781 Dehiscence of amputation stump: Secondary | ICD-10-CM | POA: Insufficient documentation

## 2020-05-26 DIAGNOSIS — Y835 Amputation of limb(s) as the cause of abnormal reaction of the patient, or of later complication, without mention of misadventure at the time of the procedure: Secondary | ICD-10-CM | POA: Diagnosis not present

## 2020-05-26 DIAGNOSIS — I132 Hypertensive heart and chronic kidney disease with heart failure and with stage 5 chronic kidney disease, or end stage renal disease: Secondary | ICD-10-CM | POA: Diagnosis not present

## 2020-05-26 DIAGNOSIS — E11621 Type 2 diabetes mellitus with foot ulcer: Secondary | ICD-10-CM | POA: Insufficient documentation

## 2020-05-26 DIAGNOSIS — N186 End stage renal disease: Secondary | ICD-10-CM | POA: Diagnosis not present

## 2020-05-26 NOTE — Progress Notes (Signed)
SHAUNE, [place]FALL ([id number]) Visit Report for 05/26/2020 Arrival Information Details Patient Name: Ralph Peterson, Ralph Peterson. Date of Service: 05/26/2020 1:30 PM Medical Record Number: [id number] Patient Account Number: [id number] Date of Birth/Sex: May 26, 1966 (54 y.o. M) Treating RN: Dolan Amen Primary Care Laynie Espy: Vidal Schwalbe Other Clinician: Referring Stephane Junkins: Vidal Schwalbe Treating Connee Ikner/Extender: Skipper Cliche in Treatment: 4 Visit Information History Since Last Visit Pain Present Now: Yes Patient Arrived: Wheel Chair Arrival Time: 13:51 Accompanied By: self Transfer Assistance: None Patient Identification Verified: Yes Secondary Verification Process Completed: Yes Patient Requires Transmission-Based Precautions: No Patient Has Alerts: No Electronic Signature(s) Signed: 05/26/2020 2:52:33 PM By: Georges Mouse, Minus Breeding Entered By: Georges Mouse, Minus Breeding on 05/26/2020 13:52:10 Ralph Peterson ([id number]) -------------------------------------------------------------------------------- Clinic Level of Care Assessment Details Patient Name: Ralph Peterson Date of Service: 05/26/2020 1:30 PM Medical Record Number: [id number] Patient Account Number: [id number] Date of Birth/Sex: 11/28/1965 (54 y.o. M) Treating RN: Dolan Amen Primary Care Colleen Kotlarz: Vidal Schwalbe Other Clinician: Referring Shamel Galyean: Vidal Schwalbe Treating Adithi Gammon/Extender: Skipper Cliche in Treatment: 4 Clinic Level of Care Assessment Items TOOL 4 Quantity Score []  - Use when only an EandM is performed on FOLLOW-UP visit 0 ASSESSMENTS - Nursing Assessment / Reassessment X - Reassessment of Co-morbidities (includes updates in patient status) 1 10 X- 1 5 Reassessment of Adherence to Treatment Plan ASSESSMENTS - Wound and Skin Assessment / Reassessment []  - Simple Wound Assessment / Reassessment - one wound 0 X- 3 5 Complex Wound Assessment / Reassessment - multiple  wounds []  - 0 Dermatologic / Skin Assessment (not related to wound area) ASSESSMENTS - Focused Assessment []  - Circumferential Edema Measurements - multi extremities 0 []  - 0 Nutritional Assessment / Counseling / Intervention []  - 0 Lower Extremity Assessment (monofilament, tuning fork, pulses) []  - 0 Peripheral Arterial Disease Assessment (using hand held doppler) ASSESSMENTS - Ostomy and/or Continence Assessment and Care []  - Incontinence Assessment and Management 0 []  - 0 Ostomy Care Assessment and Management (repouching, etc.) PROCESS - Coordination of Care X - Simple Patient / Family Education for ongoing care 1 15 []  - 0 Complex (extensive) Patient / Family Education for ongoing care X- 1 10 Staff obtains Programmer, systems, Records, Test Results / Process Orders []  - 0 Staff telephones HHA, Nursing Homes / Clarify orders / etc []  - 0 Routine Transfer to another Facility (non-emergent condition) []  - 0 Routine Hospital Admission (non-emergent condition) []  - 0 New Admissions / Biomedical engineer / Ordering NPWT, Apligraf, etc. []  - 0 Emergency Hospital Admission (emergent condition) X- 1 10 Simple Discharge Coordination []  - 0 Complex (extensive) Discharge Coordination PROCESS - Special Needs []  - Pediatric / Minor Patient Management 0 []  - 0 Isolation Patient Management []  - 0 Hearing / Language / Visual special needs []  - 0 Assessment of Community assistance (transportation, D/C planning, etc.) []  - 0 Additional assistance / Altered mentation []  - 0 Support Surface(s) Assessment (bed, cushion, seat, etc.) INTERVENTIONS - Wound Cleansing / Measurement Lofgren, Sava S. ([id number]) []  - 0 Simple Wound Cleansing - one wound X- 3 5 Complex Wound Cleansing - multiple wounds X- 1 5 Wound Imaging (photographs - any number of wounds) []  - 0 Wound Tracing (instead of photographs) []  - 0 Simple Wound Measurement - one wound X- 3 5 Complex Wound Measurement -  multiple wounds INTERVENTIONS - Wound Dressings []  - Small Wound Dressing one or multiple wounds 0 X- 1 15 Medium Wound Dressing one or multiple wounds X- 1 20 Large Wound Dressing one  or multiple wounds []  - 0 Application of Medications - topical []  - 0 Application of Medications - injection INTERVENTIONS - Miscellaneous []  - External ear exam 0 []  - 0 Specimen Collection (cultures, biopsies, blood, body fluids, etc.) []  - 0 Specimen(s) / Culture(s) sent or taken to Lab for analysis []  - 0 Patient Transfer (multiple staff / Civil Service fast streamer / Similar devices) []  - 0 Simple Staple / Suture removal (25 or less) []  - 0 Complex Staple / Suture removal (26 or more) []  - 0 Hypo / Hyperglycemic Management (close monitor of Blood Glucose) []  - 0 Ankle / Brachial Index (ABI) - do not check if billed separately X- 1 5 Vital Signs Has the patient been seen at the hospital within the last three years: Yes Total Score: 140 Level Of Care: New/Established - Level 4 Electronic Signature(s) Signed: 05/26/2020 2:52:33 PM By: Georges Mouse, Minus Breeding Entered By: Georges Mouse, Minus Breeding on 05/26/2020 14:43:27 Ralph Peterson ([id number]) -------------------------------------------------------------------------------- Encounter Discharge Information Details Patient Name: Ralph Peterson Date of Service: 05/26/2020 1:30 PM Medical Record Number: [id number] Patient Account Number: [id number] Date of Birth/Sex: 1965/09/22 (54 y.o. M) Treating RN: Dolan Amen Primary Care Captola Teschner: Vidal Schwalbe Other Clinician: Referring Alizay Bronkema: Vidal Schwalbe Treating Deondray Ospina/Extender: Skipper Cliche in Treatment: 4 Encounter Discharge Information Items Discharge Condition: Stable Ambulatory Status: Ambulatory Discharge Destination: Home Transportation: Private Auto Accompanied By: self Schedule Follow-up Appointment: Yes Clinical Summary of Care: Electronic Signature(s) Signed: 05/26/2020  2:52:33 PM By: Georges Mouse, Minus Breeding Entered By: Georges Mouse, Minus Breeding on 05/26/2020 14:45:42 Ralph Peterson ([id number]) -------------------------------------------------------------------------------- Lower Extremity Assessment Details Patient Name: Ralph Peterson. Date of Service: 05/26/2020 1:30 PM Medical Record Number: [id number] Patient Account Number: [id number] Date of Birth/Sex: 12-Jun-1966 (54 y.o. M) Treating RN: Dolan Amen Primary Care Shaquel Josephson: Vidal Schwalbe Other Clinician: Referring Yaretzy Olazabal: Vidal Schwalbe Treating Ammi Hutt/Extender: Jeri Cos Weeks in Treatment: 4 Electronic Signature(s) Signed: 05/26/2020 2:52:33 PM By: Georges Mouse, Minus Breeding Entered By: Georges Mouse, Minus Breeding on 05/26/2020 14:14:00 Ralph Peterson ([id number]) -------------------------------------------------------------------------------- Multi Wound Chart Details Patient Name: Ralph Peterson Date of Service: 05/26/2020 1:30 PM Medical Record Number: [id number] Patient Account Number: [id number] Date of Birth/Sex: [birth year] (54 y.o. M) Treating RN: Dolan Amen Primary Care Nneka Blanda: Vidal Schwalbe Other Clinician: Referring Madalaine Portier: Vidal Schwalbe Treating Shaurya Rawdon/Extender: Skipper Cliche in Treatment: 4 Vital Signs Height(in): 72 Pulse(bpm): 4 Weight(lbs): 225 Blood Pressure(mmHg): 122/84 Body Mass Index(BMI): 31 Temperature(F): 98 Respiratory Rate(breaths/min): 16 Photos: Wound Location: Right, Lateral Foot Right, Dorsal Foot Right, Plantar Foot Wounding Event: Pressure Injury Surgical Injury Surgical Injury Primary Etiology: Pressure Ulcer Open Surgical Wound Diabetic Wound/Ulcer of the Lower Extremity Comorbid History: Arrhythmia, Congestive Heart Arrhythmia, Congestive Heart Arrhythmia, Congestive Heart Failure, Coronary Artery Disease, Failure, Coronary Artery Disease, Failure, Coronary Artery Disease, Hypertension, Myocardial Infarction,  Hypertension, Myocardial Infarction, Hypertension, Myocardial Infarction, Peripheral Venous Disease, Type II Peripheral Venous Disease, Type II Peripheral Venous Disease, Type II Diabetes, Neuropathy Diabetes, Neuropathy Diabetes, Neuropathy Date Acquired: 02/20/2020 04/20/2020 03/24/2020 Weeks of Treatment: 4 4 1  Wound Status: Open Open Open Measurements L x W x D (cm) 2.5x2x0.3 4x1x0.8 1.2x0.3x2.5 Area (cm) : 3.927 3.142 0.283 Volume (cm) : 1.178 2.513 0.707 % Reduction in Area: 22.40% 33.30% 57.60% % Reduction in Volume: 53.40% -77.70% -111.70% Classification: Category/Stage IV Full Thickness Without Exposed Grade 1 Support Structures Exudate Amount: Medium N/A Medium Exudate Type: Serosanguineous N/A Purulent Exudate Color: red, brown N/A yellow, brown, green Granulation Amount: Medium (34-66%) Medium (34-66%) None Present (0%) Granulation Quality: Red Red  N/A Necrotic Amount: Small (1-33%) Small (1-33%) Small (1-33%) Exposed Structures: Fat Layer (Subcutaneous Tissue): Fat Layer (Subcutaneous Tissue): Fat Layer (Subcutaneous Tissue): Yes Yes Yes Tendon: Yes Fascia: No Fascia: No Joint: Yes Tendon: No Tendon: No Fascia: No Muscle: No Muscle: No Muscle: No Joint: No Joint: No Bone: No Bone: No Bone: No Epithelialization: None N/A None Treatment Notes Electronic Signature(s) Signed: 05/26/2020 2:52:33 PM By: Georges Mouse, Minus Breeding Entered By: Georges Mouse, Minus Breeding on 05/26/2020 14:40:03 Ralph Peterson ([name] ([id number]) -------------------------------------------------------------------------------- [place] Details Patient Name: Ralph Peterson Date of Service: 05/26/2020 1:30 PM Medical Record Number: [id number] Patient Account Number: [id number] Date of Birth/Sex: Dec 02, 1965 (54 y.o. M) Treating RN: Dolan Amen Primary Care Jesicca Dipierro: Vidal Schwalbe Other Clinician: Referring Kayce Chismar: Vidal Schwalbe Treating Parris Cudworth/Extender: Skipper Cliche in Treatment: 4 Active Inactive Necrotic Tissue Nursing Diagnoses: Impaired tissue integrity related to necrotic/devitalized tissue Knowledge deficit related to management of necrotic/devitalized tissue Goals: Necrotic/devitalized tissue will be minimized in the wound bed Date Initiated: 05/09/2020 Target Resolution Date: 05/09/2020 Goal Status: Active Interventions: Assess patient pain level pre-, during and post procedure and prior to discharge Provide education on necrotic tissue and debridement process Treatment Activities: Enzymatic debridement : 05/09/2020 Notes: Orientation to the Wound Care Program Nursing Diagnoses: Knowledge deficit related to the wound healing center program Goals: Patient/caregiver will verbalize understanding of the [place] Program Date Initiated: 04/27/2020 Target Resolution Date: 05/18/2020 Goal Status: Active Interventions: Provide education on orientation to the wound center Notes: Wound/Skin Impairment Nursing Diagnoses: Impaired tissue integrity Knowledge deficit related to ulceration/compromised skin integrity Goals: Patient/caregiver will verbalize understanding of skin care regimen Date Initiated: 04/27/2020 Target Resolution Date: 05/25/2020 Goal Status: Active Interventions: Assess patient/caregiver ability to obtain necessary supplies Assess patient/caregiver ability to perform ulcer/skin care regimen upon admission and as needed Assess ulceration(s) every visit Provide education on ulcer and skin care Treatment Activities: Skin care regimen initiated : 04/27/2020 FAREED, FUNG ([id number]) Topical wound management initiated : 04/27/2020 Notes: Electronic Signature(s) Signed: 05/26/2020 2:52:33 PM By: Georges Mouse, Minus Breeding Entered By: Georges Mouse, Minus Breeding on 05/26/2020 14:39:53 Ralph Peterson  ([id number]) -------------------------------------------------------------------------------- Pain Assessment Details Patient Name: Ralph Peterson Date of Service: 05/26/2020 1:30 PM Medical Record Number: [id number] Patient Account Number: [id number] Date of Birth/Sex: 03-27-1966 (54 y.o. M) Treating RN: Dolan Amen Primary Care Asyah Candler: Vidal Schwalbe Other Clinician: Referring Meyer Dockery: Vidal Schwalbe Treating Jareli Highland/Extender: Skipper Cliche in Treatment: 4 Active Problems Location of Pain Severity and Description of Pain Patient Has Paino Yes Site Locations Pain Location: Generalized Pain Rate the pain. Current Pain Level: 1 Character of Pain Describe the Pain: Tender Pain Management and Medication Current Pain Management: Electronic Signature(s) Signed: 05/26/2020 2:52:33 PM By: Georges Mouse, Minus Breeding Entered By: Georges Mouse, Minus Breeding on 05/26/2020 13:54:52 Ralph Peterson ([id number]) -------------------------------------------------------------------------------- Patient/Caregiver Education Details Patient Name: Ralph Peterson Date of Service: 05/26/2020 1:30 PM Medical Record Number: [id number] Patient Account Number: [id number] Date of Birth/Gender: 1966/07/03 (54 y.o. M) Treating RN: Dolan Amen Primary Care Physician: Vidal Schwalbe Other Clinician: Referring Physician: Vidal Schwalbe Treating Physician/Extender: Skipper Cliche in Treatment: 4 Education Assessment Education Provided To: Patient Education Topics Provided Wound/Skin Impairment: Handouts: Caring for Your Ulcer Methods: Demonstration, Explain/Verbal Responses: State content correctly Electronic Signature(s) Signed: 05/26/2020 2:52:33 PM By: Georges Mouse, Minus Breeding Entered By: Georges Mouse, Minus Breeding on 05/26/2020 14:43:48 Ralph Peterson ([id number]) -------------------------------------------------------------------------------- Wound Assessment  Details Patient Name: Ralph Peterson. Date of Service: 05/26/2020 1:30 PM Medical Record  Number: [id number] Patient Account Number: [id number] Date of Birth/Sex: 1965-11-11 (54 y.o. M) Treating RN: Dolan Amen Primary Care Mutasim Tuckey: Vidal Schwalbe Other Clinician: Referring Keslyn Teater: Vidal Schwalbe Treating Jasleen Riepe/Extender: Skipper Cliche in Treatment: 4 Wound Status Wound Number: 1 Primary Pressure Ulcer Etiology: Wound Location: Right, Lateral Foot Wound Open Wounding Event: Pressure Injury Status: Date Acquired: 02/20/2020 Comorbid Arrhythmia, Congestive Heart Failure, Coronary Artery Weeks Of Treatment: 4 History: Disease, Hypertension, Myocardial Infarction, Peripheral Clustered Wound: No Venous Disease, Type II Diabetes, Neuropathy Photos Wound Measurements Length: (cm) 2.5 % R Width: (cm) 2 % R Depth: (cm) 0.3 Epi Area: (cm) 3.927 Tu Volume: (cm) 1.178 Un eduction in Area: 22.4% eduction in Volume: 53.4% thelialization: None nneling: No dermining: No Wound Description Classification: Category/Stage IV Fou Exudate Amount: Medium Slo Exudate Type: Serosanguineous Exudate Color: red, brown l Odor After Cleansing: No ugh/Fibrino Yes Wound Bed Granulation Amount: Medium (34-66%) Exposed Structure Granulation Quality: Red Fascia Exposed: No Necrotic Amount: Small (1-33%) Fat Layer (Subcutaneous Tissue) Exposed: Yes Necrotic Quality: Adherent Slough Tendon Exposed: Yes Muscle Exposed: No Joint Exposed: Yes Bone Exposed: No Treatment Notes Wound #1 (Right, Lateral Foot) Notes [employer], mepitel one, lateral, silver alg, abb, gauze. Electronic Signature(s) Signed: 05/26/2020 2:52:33 PM By: Georges Mouse, Joaquin Courts, Judeth Cornfield ([id number]) Entered By: Georges Mouse, Minus Breeding on 05/26/2020 14:13:00 Ralph Peterson ([id number]) -------------------------------------------------------------------------------- Wound Assessment  Details Patient Name: Ralph Peterson Date of Service: 05/26/2020 1:30 PM Medical Record Number: [id number] Patient Account Number: [id number] Date of Birth/Sex: 02-23-1966 (54 y.o. M) Treating RN: Dolan Amen Primary Care Aizah Gehlhausen: Vidal Schwalbe Other Clinician: Referring Joselynn Amoroso: Vidal Schwalbe Treating Shere Eisenhart/Extender: Skipper Cliche in Treatment: 4 Wound Status Wound Number: 2 Primary Open Surgical Wound Etiology: Wound Location: Right, Dorsal Foot Wound Open Wounding Event: Surgical Injury Status: Date Acquired: 04/20/2020 Comorbid Arrhythmia, Congestive Heart Failure, Coronary Artery Weeks Of Treatment: 4 History: Disease, Hypertension, Myocardial Infarction, Peripheral Clustered Wound: No Venous Disease, Type II Diabetes, Neuropathy Photos Wound Measurements Length: (cm) 4 Width: (cm) 1 Depth: (cm) 0.8 Area: (cm) 3.142 Volume: (cm) 2.513 % Reduction in Area: 33.3% % Reduction in Volume: -77.7% Tunneling: No Undermining: No Wound Description Classification: Full Thickness Without Exposed Support Structu res Wound Bed Granulation Amount: Medium (34-66%) Exposed Structure Granulation Quality: Red Fascia Exposed: No Necrotic Amount: Small (1-33%) Fat Layer (Subcutaneous Tissue) Exposed: Yes Necrotic Quality: Adherent Slough Tendon Exposed: No Muscle Exposed: No Joint Exposed: No Bone Exposed: No Treatment Notes Wound #2 (Right, Dorsal Foot) Notes [employer], mepitel one, lateral, silver alg, abb, gauze. Electronic Signature(s) Signed: 05/26/2020 2:52:33 PM By: Georges Mouse, Minus Breeding Entered By: Georges Mouse, Minus Breeding on 05/26/2020 14:12:01 Ralph Peterson ([id number]) -------------------------------------------------------------------------------- Wound Assessment Details Patient Name: Ralph Peterson Date of Service: 05/26/2020 1:30 PM Medical Record Number: [id number] Patient Account Number: [id number] Date of Birth/Sex:  29-Jan-1966 (54 y.o. M) Treating RN: Dolan Amen Primary Care Desirae Mancusi: Vidal Schwalbe Other Clinician: Referring Ugonna Keirsey: Vidal Schwalbe Treating Evadean Sproule/Extender: Skipper Cliche in Treatment: 4 Wound Status Wound Number: 5 Primary Diabetic Wound/Ulcer of the Lower Extremity Etiology: Wound Location: Right, Plantar Foot Wound Open Wounding Event: Surgical Injury Status: Date Acquired: 03/24/2020 Comorbid Arrhythmia, Congestive Heart Failure, Coronary Artery Weeks Of Treatment: 1 History: Disease, Hypertension, Myocardial Infarction, Peripheral Clustered Wound: No Venous Disease, Type II Diabetes, Neuropathy Photos Wound Measurements Length: (cm) 1.2 % R Width: (cm) 0.3 % R Depth: (cm) 2.5 Epi Area: (cm) 0.283 Tu Volume: (cm) 0.707 Un eduction in Area: 57.6% eduction in Volume: -111.7% thelialization:  None nneling: No dermining: No Wound Description Classification: Grade 1 Fou Exudate Amount: Medium Slo Exudate Type: Purulent Exudate Color: yellow, brown, green l Odor After Cleansing: No ugh/Fibrino Yes Wound Bed Granulation Amount: None Present (0%) Exposed Structure Necrotic Amount: Small (1-33%) Fascia Exposed: No Necrotic Quality: Adherent Slough Fat Layer (Subcutaneous Tissue) Exposed: Yes Tendon Exposed: No Muscle Exposed: No Joint Exposed: No Bone Exposed: No Treatment Notes Wound #5 (Right, Plantar Foot) Notes [employer], mepitel one, lateral, silver alg, abb, gauze. Electronic Signature(s) Signed: 05/26/2020 2:52:33 PM By: Georges Mouse, Joaquin Courts, Judeth Cornfield ([id number]) Entered By: Georges Mouse, Minus Breeding on 05/26/2020 14:11:05 Ralph Peterson ([id number]) -------------------------------------------------------------------------------- Vitals Details Patient Name: Ralph Peterson Date of Service: 05/26/2020 1:30 PM Medical Record Number: [id number] Patient Account Number: [id number] Date of Birth/Sex: 10-25-1965 (54 y.o.  M) Treating RN: Dolan Amen Primary Care Robbi Scurlock: Vidal Schwalbe Other Clinician: Referring Hikari Tripp: Vidal Schwalbe Treating Carolynne Schuchard/Extender: Skipper Cliche in Treatment: 4 Vital Signs Time Taken: 01:52 Temperature (F): 98 Height (in): 72 Pulse (bpm): 73 Weight (lbs): 225 Respiratory Rate (breaths/min): 16 Body Mass Index (BMI): 30.5 Blood Pressure (mmHg): 122/84 Reference Range: 80 - 120 mg / dl Electronic Signature(s) Signed: 05/26/2020 2:52:33 PM By: Georges Mouse, Minus Breeding Entered By: Georges Mouse, Minus Breeding on 05/26/2020 13:54:38

## 2020-05-26 NOTE — Progress Notes (Addendum)
ROBEL, WUERTZ ([id number]) Visit Report for 05/26/2020 Chief Complaint Document Details Patient Name: Ralph Peterson, Ralph Peterson. Date of Service: 05/26/2020 1:30 PM Medical Record Number: [id number] Patient Account Number: [id number] Date of Birth/Sex: [birth year] (54 y.o. 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 M) Treating RN: Cornell Barman Primary Care Provider: Vidal Schwalbe Other Clinician: Referring Provider: Vidal Schwalbe Treating Provider/Extender: Skipper Cliche in Treatment: 4 History of Present Illness HPI Description: He has been having with multiple ulcers of his feet. He does have a couple callus areas that have ulcerations underneath on the left foot. 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 Fortunately there is no sign of active infection at this time which is good news he has been using Telfa pads after applying Betadine this is not really absorbing enough of the drainage however which is causing the area especially at the amputation site to be very macerated. The patient states that he actually has a work-up with Dr. Delana Meyer at [facility] next Thursday. For that reason we did not perform ABIs today and again I really am not doing any significant debridement today we will discontinue mild things to remove some of the necrotic debris so that we hopefully get these wounds moving in the proper direction. With that being said his extremities do appear to be warm without any signs obviously of significant arterial flow. The patient does have a history of diabetes mellitus type 2, congestive heart failure, end- stage renal disease, and dependence on renal dialysis. He notes that he did have an exacerbation of heart failure recently which caused his legs to swell he had a couple wounds on his left leg but fortunately these have actually improved and are healed as of today. 05/09/2020 on evaluation today patient appears to be doing decently well all things considered in regard to his wounds. He does have the sutures out at this point in regard to his right amputation site. That does seem to be showing signs of improvement which is good news from the standpoint of at least being able to get some of the slough out although he does have quite a bit of slough buildup on the surface of the wound here. I think that is can require debridement before were able to see dramatic filling in. He may even require wound VAC at the site. With regard to the lateral foot this seems to be loosening up with the Santyl and I am pleased in that regard. In regard to the left foot everything appears to be healed and is doing great. 05/18/2020 on evaluation today patient appears to be doing well at this time in regard to his wounds. The wound on the lateral portion of his foot and the dorsal surface of his foot has been require sharp debridement of plantar aspect which was separated out from the  incision site from the ray amputation actually appears to be almost completely healed and is doing excellent. There is no signs of active infection at this  time. 05/26/2020 on evaluation today patient appears to be actually showing signs of improvement in general in regard to his wounds. The plantar aspect wound does connect to the dorsal surface wound which I initially expected but after last week was hopeful that that would not be the case. I was in fact hoping it would completely sealed up this week but it does not seem to have happened. There is no signs of active infection at this time which is great news. With that being said he does still have tender noted on the lateral aspect of the foot where the wound is there but this does not seem to be nearly as slough covered in fact I was able to carefully clean this off today and other than the fact that we need to help seal up the tendon I do not see any signs of infection at this point. Electronic Signature(s) Signed: 05/26/2020 3:10:16 PM By: Worthy Keeler PA-C Entered By: Worthy Keeler on 05/26/2020 15:10:16 Ralph Peterson ([id number]) -------------------------------------------------------------------------------- Physical Exam Details Patient Name: Ralph Peterson Date of Service: 05/26/2020 1:30 PM Medical Record Number: [id number] Patient Account Number: [id number] Date of Birth/Sex: 03-05-1966 (54 y.o. M) Treating RN: Cornell Barman Primary Care Provider: Vidal Schwalbe Other Clinician: Referring Provider: Vidal Schwalbe Treating Provider/Extender: Skipper Cliche in Treatment: 4 Constitutional Well-nourished and well-hydrated in no acute distress. Respiratory normal breathing without difficulty. Psychiatric this patient is able to make decisions and demonstrates good insight into disease process. Alert and Oriented x 3. pleasant and cooperative. Notes Upon inspection patient's wound bed actually showed signs of slough noted at both locations I was able to clean this all quite readily with just saline and gauze no sharp debridement was necessary which was great news.  The only main issue I see is we considered the possibility of a snap VAC for the wound although I am more concerned about the opening on the plantar aspect of the foot that this may cause trouble in that regard. With that being said this was discussed with the patient today were going check with the vendor and see what they have to say about the possibility of usage in the situation. Electronic Signature(s) Signed: 05/26/2020 3:10:57 PM By: Worthy Keeler PA-C Entered By: Worthy Keeler on 05/26/2020 15:10:57 Ralph Peterson ([id number]) -------------------------------------------------------------------------------- Physician Orders Details Patient Name: Ralph Peterson Date of Service: 05/26/2020 1:30 PM Medical Record Number: [id number] Patient Account Number: [id number] Date of Birth/Sex: 02-20-1966 (54 y.o. M) Treating RN: Dolan Amen Primary Care Provider: Vidal Schwalbe Other Clinician: Referring Provider: Vidal Schwalbe Treating Provider/Extender: Skipper Cliche in Treatment: 4 Verbal / Phone Orders: No Diagnosis Coding ICD-10 Coding Code Description [phone] Pressure ulcer of other site, stage 3 T81.31XA Disruption of external operation (surgical) wound, not elsewhere classified, initial encounter 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d L97.522 Non-pressure chronic ulcer of other part of left foot with fat layer exposed E11.621 Type 2 diabetes mellitus with foot ulcer I50.42 Chronic combined systolic (congestive) and diastolic (congestive) heart failure N18.6 End stage renal disease Z99.2 Dependence on renal dialysis Wound Cleansing Wound #1 Right,Lateral Foot o Clean wound with Normal Saline. Wound #2 Right,Dorsal Foot o Clean wound with Normal Saline. Wound #5 Right,Plantar Foot o Clean wound with Normal Saline. Anesthetic (add  to Medication List) Wound #1 Right,Lateral Foot o Topical Lidocaine 4%  cream applied to wound bed prior to debridement (In Clinic Only). Wound #2 Right,Dorsal Foot o Topical Lidocaine 4% cream applied to wound bed prior to debridement (In Clinic Only). Wound #5 Right,Plantar Foot o Topical Lidocaine 4% cream applied to wound bed prior to debridement (In Clinic Only). Primary Wound Dressing Wound #1 Right,Lateral Foot o Silver Collagen o Mepitel One Contact layer Wound #2 Right,Dorsal Foot o Silver Alginate Wound #5 Right,Plantar Foot o Silver Alginate Secondary Dressing Wound #2 Right,Dorsal Foot o ABD and Kerlix/Conform Wound #5 Right,Plantar Foot o ABD and Kerlix/Conform Dressing Change Frequency Wound #1 Right,Lateral Foot o Change dressing every other day. RALPHEAL, ZAPPONE ([id number]) Wound #2 Right,Dorsal Foot o Change dressing every other day. Wound #5 Right,Plantar Foot o Change dressing every other day. Follow-up Appointments Wound #1 Right,Lateral Foot o Return Appointment in 1 week. Wound #2 Right,Dorsal Foot o Return Appointment in 1 week. Wound #5 Right,Plantar Foot o Return Appointment in 1 week. Edema Control Wound #1 Right,Lateral Foot o Elevate legs to the level of the heart and pump ankles as often as possible Wound #2 Right,Dorsal Foot o Elevate legs to the level of the heart and pump ankles as often as possible Wound #5 Right,Plantar Foot o Elevate legs to the level of the heart and pump ankles as often as possible Off-Loading Wound #1 Right,Lateral Foot o Other: - front off-loader right Wound #2 Right,Dorsal Foot o Other: - front off-loader right Wound #5 Right,Plantar Foot o Other: - front off-loader right Additional Orders / Instructions Wound #1 Right,Lateral Foot o Increase protein intake. Wound #2 Right,Dorsal Foot o Increase protein intake. Wound #5 Right,Plantar Foot o Increase protein intake. Electronic Signature(s) Signed: 05/26/2020 2:52:33 PM By:  Georges Mouse, Minus Breeding Signed: 05/26/2020 4:25:27 PM By: Worthy Keeler PA-C Entered By: Georges Mouse, Minus Breeding on 05/26/2020 14:42:24 Ralph Peterson ([id number]) -------------------------------------------------------------------------------- Problem List Details Patient Name: Ralph Peterson Date of Service: 05/26/2020 1:30 PM Medical Record Number: [id number] Patient Account Number: [id number] Date of Birth/Sex: [birth year] (54 y.o. M) Treating RN: Cornell Barman Primary Care Provider: Vidal Schwalbe Other Clinician: Referring Provider: Vidal Schwalbe Treating Provider/Extender: Skipper Cliche in Treatment: 4 Active Problems ICD-10 Encounter Code Description Active Date MDM Diagnosis L89.893 Pressure ulcer of other site, stage 3 04/27/2020 No Yes T81.31XA Disruption of external operation (surgical) wound, not elsewhere 04/27/2020 No Yes classified, initial encounter L97.512 Non-pressure chronic ulcer of other part of right foot with fat layer 04/27/2020 No Yes exposed L97.522 Non-pressure chronic ulcer of other part of left foot with fat layer 04/27/2020 No Yes exposed E11.621 Type 2 diabetes mellitus with foot ulcer 04/27/2020 No Yes I50.42 Chronic combined systolic (congestive) and diastolic (congestive) heart 04/27/2020 No Yes failure N18.6 End stage renal disease 04/27/2020 No Yes Z99.2 Dependence on renal dialysis 04/27/2020 No Yes Inactive Problems Resolved Problems Electronic Signature(s) Signed: 05/26/2020 2:21:45 PM By: Worthy Keeler PA-C Entered By: Worthy Keeler on 05/26/2020 14:21:44 Ralph Peterson ([id number]) -------------------------------------------------------------------------------- Progress Note Details Patient Name: Ralph Peterson Date of Service: 05/26/2020 1:30 PM Medical Record Number: [id number] Patient Account Number: [id number] Date of Birth/Sex: March 16, 1966 (54 y.o. 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 He does have a couple callus areas that have ulcerations underneath on the left foot. 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 Fortunately there is no sign of active infection at this time which is good news he has been using Telfa pads after applying Betadine this is not really absorbing enough of the drainage however which is causing the area especially at the amputation site to be very macerated. The patient states that he actually has a work-up with Dr. Delana Meyer at [facility] next Thursday. For that reason we did not perform ABIs today and again I really am not doing any significant debridement today we will discontinue mild things to remove some of the necrotic debris so that we hopefully get these wounds moving in the proper direction. With that being said his extremities do appear to be warm without any signs obviously of significant arterial flow. The patient does have a history of diabetes mellitus type 2, congestive heart failure, end- stage renal disease, and dependence on renal dialysis. He notes that he did have an exacerbation of heart failure recently which caused his legs to swell he had a couple wounds on his left leg but fortunately these have actually improved and are healed as of today. 05/09/2020 on evaluation today patient appears to be doing decently well all things considered in regard to his wounds. He does have the sutures out at this point in regard to his right amputation site. That does  seem to be showing signs of improvement which is good news from the standpoint of at least being able to get some of the slough out although he does have quite a bit of slough buildup on the surface of the wound here. I think that is can require debridement before were able to see dramatic filling in. He may even require wound VAC at the site. With regard to the lateral foot this seems to be loosening up with the Santyl and I am pleased in that regard. In regard to the left foot everything appears to be healed and is doing great. 05/18/2020 on evaluation today patient appears to be doing well at this time in regard to his wounds. 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 There is no signs of active infection at this time. 05/26/2020 on evaluation today patient appears to be actually showing signs of improvement in general in regard to his wounds. The plantar aspect wound does connect to the dorsal surface wound which I initially expected but after last week was hopeful that that would not be the case. I was in fact hoping it would completely sealed up this week but it does not seem to have happened. There is no signs of active infection at this time which is great news. With that being said he does still have tender noted on the lateral aspect of the foot where the wound is there but this does not seem to be nearly as slough covered in fact I was able to carefully clean this off today and other than the fact that we need to  help seal up the tendon I do not see any signs of infection at this point. Objective Constitutional Well-nourished and well-hydrated in no acute distress. Vitals Time Taken: 1:52 AM, Height: 72 in, Weight: 225 lbs, BMI: 30.5, Temperature: 98 F,  Pulse: 73 bpm, Respiratory Rate: 16 breaths/min, Blood Pressure: 122/84 mmHg. Respiratory normal breathing without difficulty. Psychiatric this patient is able to make decisions and demonstrates good insight into disease process. Alert and Oriented x 3. pleasant and cooperative. General Notes: Upon inspection patient's wound bed actually showed signs of slough noted at both locations I was able to clean this all quite readily with just saline and gauze no sharp debridement was necessary which was great news. The only main issue I see is we considered the possibility of a snap VAC for the wound although I am more concerned about the opening on the plantar aspect of the foot that this may cause trouble in that regard. With that being said this was discussed with the patient today were going check with the vendor and see what they have to say about the possibility of usage in the situation. LUCAH, PETTA ([id number]) Integumentary (Hair, Skin) Wound #1 status is Open. Original cause of wound was Pressure Injury. The wound is located on the Right,Lateral Foot. The wound measures 2.5cm length x 2cm width x 0.3cm depth; 3.927cm^2 area and 1.178cm^3 volume. There is joint, tendon, and Fat Layer (Subcutaneous Tissue) exposed. There is no tunneling or undermining noted. There is a medium amount of serosanguineous drainage noted. There is medium (34-66%) red granulation within the wound bed. There is a small (1-33%) amount of necrotic tissue within the wound bed including Adherent Slough. Wound #2 status is Open. Original cause of wound was Surgical Injury. The wound is located on the Right,Dorsal Foot. The wound measures 4cm length x 1cm width x 0.8cm depth; 3.142cm^2 area and 2.513cm^3 volume. There is Fat Layer (Subcutaneous Tissue) exposed. There is no tunneling or undermining noted. There is medium (34-66%) red granulation within the wound bed. There is a small (1-33%) amount of  necrotic tissue within the wound bed including Adherent Slough. Wound #5 status is Open. Original cause of wound was Surgical Injury. The wound is located on the [place]. The wound measures 1.2cm length x 0.3cm width x 2.5cm depth; 0.283cm^2 area and 0.707cm^3 volume. There is Fat Layer (Subcutaneous Tissue) exposed. There is no tunneling or undermining noted. There is a medium amount of purulent drainage noted. There is no granulation within the wound bed. There is a small (1-33%) amount of necrotic tissue within the wound bed including Adherent Slough. 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 Wound #2 Right,Dorsal Foot: Clean wound with Normal Saline. Wound #5 Right,Plantar Foot: Clean wound with Normal Saline. Anesthetic (add to Medication List): Wound #1 Right,Lateral Foot: Topical Lidocaine 4% cream applied to wound bed prior to debridement (In Clinic Only). Wound #2 Right,Dorsal Foot: Topical Lidocaine 4% cream applied to wound bed prior to debridement (In Clinic Only). Wound #5 Right,Plantar Foot: Topical Lidocaine 4% cream applied  to wound bed prior to debridement (In Clinic Only). Primary Wound Dressing: Wound #1 Right,Lateral Foot: Silver Collagen Mepitel One Contact layer Wound #2 Right,Dorsal Foot: Silver Alginate Wound #5 Right,Plantar Foot: Silver Alginate Secondary Dressing: Wound #2 Right,Dorsal Foot: ABD and  Kerlix/Conform Wound #5 Right,Plantar Foot: ABD and Kerlix/Conform Dressing Change Frequency: Wound #1 Right,Lateral Foot: Change dressing every other day. Wound #2 Right,Dorsal Foot: Change dressing every other day. Wound #5 Right,Plantar Foot: Change dressing every other day. Follow-up Appointments: Wound #1 Right,Lateral Foot: JARAMIE, BASTOS. ([id number]) Return Appointment in 1 week. Wound #2 Right,Dorsal Foot: Return Appointment in 1 week. Wound #5 Right,Plantar Foot: Return Appointment in 1 week. Edema Control: Wound #1 Right,Lateral Foot: Elevate legs to the level of the heart and pump ankles as often as possible Wound #2 Right,Dorsal Foot: Elevate legs to the level of the heart and pump ankles as often as possible Wound #5 Right,Plantar Foot: Elevate legs to the level of the heart and pump ankles as often as possible Off-Loading: Wound #1 Right,Lateral Foot: Other: - front off-loader right Wound #2 Right,Dorsal Foot: Other: - front off-loader right Wound #5 Right,Plantar Foot: Other: - front off-loader right Additional Orders / Instructions: Wound #1 Right,Lateral Foot: Increase protein intake. Wound #2 Right,Dorsal Foot: Increase protein intake. Wound #5 Right,Plantar Foot: Increase protein intake. 1. I would recommend currently that we going to continue with the silver alginate to the foot ulcer locations I think this is appropriate considering what we are seeing. 2. I am also can recommend at this time that we continue to treat the lateral foot wound there is tendon exposed but fortunately it appears to be healthy at the moment I am hopeful to keep it that way. We can use silver collagen over the tendon wound area we will then subsequently put the Adaptic over top of this in order to protect it from drying out and then use a border foam dressing to cover. 3. I am also can recommend at this time that the patient needs to continue to protect his foot from a  lot of pressure and walking obviously the more of this he can do the better off he will be. We will see patient back for reevaluation in 1 week here in the clinic. If anything worsens or changes patient will contact our office for additional recommendations. Electronic Signature(s) Signed: 05/26/2020 3:11:44 PM By: Worthy Keeler PA-C Entered By: Worthy Keeler on 05/26/2020 15:11:44 Ralph Peterson ([id number]) -------------------------------------------------------------------------------- SuperBill Details Patient Name: Ralph Peterson Date of Service: 05/26/2020 Medical Record Number: [id number] Patient Account Number: [id number] Date of Birth/Sex: [birth year] (54 y.o. M) Treating RN: Cornell Barman Primary Care Provider: Vidal Schwalbe Other Clinician: Referring Provider: Vidal Schwalbe Treating Provider/Extender: Skipper Cliche in Treatment: 4 Diagnosis Coding ICD-10 Codes Code Description [phone] Pressure ulcer of other site, stage 3 T81.31XA Disruption of external operation (surgical) wound, not elsewhere classified, initial encounter 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d L97.522 Non-pressure chronic ulcer of other part of left foot with fat layer exposed E11.621 Type 2 diabetes mellitus with foot ulcer I50.42 Chronic combined systolic (congestive) and diastolic (congestive) heart failure N18.6 End stage renal disease Z99.2 Dependence on renal dialysis Facility Procedures CPT4 Code: [id number] Description: 99214 - WOUND CARE VISIT-LEV 4 EST PT Modifier: Quantity: 1 Physician Procedures CPT4 Code: [id number] Description: 99214 - WC PHYS LEVEL 4 - EST PT Modifier: Quantity: 1 CPT4 Code: Description: ICD-10 Diagnosis Description L89.893 Pressure ulcer  of other site, stage 3 T81.31XA Disruption of external operation (surgical) wound, not elsewhere classifi L97.512 Non-pressure chronic ulcer of other part of right foot  with fat layer exp  L97.522 Non-pressure chronic ulcer of other part of left foot with fat layer expo Modifier: ed, initial encounter osed sed Quantity: Electronic Signature(s) Signed: 05/26/2020 3:16:07 PM By: Worthy Keeler PA-C Entered By: Worthy Keeler on 05/26/2020 15:16:06

## 2020-05-30 ENCOUNTER — Encounter: Payer: Medicare PPO | Admitting: Podiatry

## 2020-06-02 ENCOUNTER — Other Ambulatory Visit: Payer: Self-pay

## 2020-06-02 ENCOUNTER — Encounter: Payer: Medicare PPO | Admitting: Physician Assistant

## 2020-06-02 DIAGNOSIS — L89893 Pressure ulcer of other site, stage 3: Secondary | ICD-10-CM | POA: Diagnosis not present

## 2020-06-02 NOTE — Progress Notes (Addendum)
PIOTR, Rylei ([id number]) Visit Report for 06/02/2020 Chief Complaint Document Details Patient Name: Ralph, Peterson. Date of Service: 06/02/2020 1:00 PM Medical Record Number: [id number] Patient Account Number: [id number] Date of Birth/Sex: 06-02-1966 (54 y.o. 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 M) Treating RN: Army Melia Primary Care Provider: Vidal Schwalbe Other Clinician: Referring Provider: Vidal Schwalbe Treating Provider/Extender: Skipper Cliche in Treatment: 5 History of Present Illness HPI Description: He has been having with multiple ulcers of his feet. He does have a couple callus areas that have ulcerations underneath on the left foot. 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 Fortunately there is no sign of active infection at this time which is good news he has been using Telfa pads after applying Betadine this is not really absorbing enough of the drainage however which is causing the area especially at the amputation site to be very macerated. The patient states that he actually has a work-up with Dr. Delana Meyer at [facility] next Thursday. For that reason we did not perform ABIs today and again I really am not doing any significant debridement today we will discontinue mild things to remove some of the necrotic debris so that we hopefully get these wounds moving in the proper direction. With that being said his extremities do appear to be warm without any signs obviously of significant arterial flow. The patient does have a history of diabetes mellitus type 2, congestive heart failure, end- stage renal disease, and dependence on renal dialysis. He notes that he did have an exacerbation of heart failure recently which caused his legs to swell he had a couple wounds on his left leg but fortunately these have actually improved and are healed as of today. 05/09/2020 on evaluation today patient appears to be doing decently well all things considered in regard to his wounds. He does have the sutures out at this point in regard to his right amputation site. That does seem to be showing signs of improvement which is good news from the standpoint of at least being able to get some of the slough out although he does have quite a bit of slough buildup on the surface of the wound here. I think that is can require debridement before were able to see dramatic filling in. He may even require wound VAC at the site. With regard to the lateral foot this seems to be loosening up with the Santyl and I am pleased in that regard. In regard to the left foot everything appears to be healed and is doing great. 05/18/2020 on evaluation today patient appears to be doing well at this time in regard to his wounds. The wound on the lateral portion of his foot and the dorsal surface of his foot has been require sharp debridement of plantar aspect which was separated out from the  incision site from the ray amputation actually appears to be almost completely healed and is doing excellent. There is no signs of active infection at this  time. 05/26/2020 on evaluation today patient appears to be actually showing signs of improvement in general in regard to his wounds. The plantar aspect wound does connect to the dorsal surface wound which I initially expected but after last week was hopeful that that would not be the case. I was in fact hoping it would completely sealed up this week but it does not seem to have happened. There is no signs of active infection at this time which is great news. With that being said he does still have tender noted on the lateral aspect of the foot where the wound is there but this does not seem to be nearly as slough covered in fact I was able to carefully clean this off today and other than the fact that we need to help seal up the tendon I do not see any signs of infection at this point. 06/02/2020 upon evaluation today patient actually is making excellent improvement in regard to all of his wounds. In fact the plantar aspect of his foot looks like it may have sealed up although I cannot be 614% certain in this regard. With that being said I do see where there was some drainage on his dressing but today I cannot find any real depth to the wound opening and it appears to be close to closed if not completely. Nonetheless I do think that the dorsal surface of the foot is doing excellent in fact we have gotten approval for snap VAC but I am not even sure that skin to be necessary he is healing so well already. In regard to the lateral foot I do feel like the collagen has been beneficial here and my recommendation is good to be that we likely continue as such with this. Tenderness still exposed but seems to be nice and moist I see no issues in that regard. Electronic Signature(s) Signed: 06/02/2020 2:11:40 PM By: Worthy Keeler PA-C Entered By: Worthy Keeler on 06/02/2020 14:11:39 Ralph Peterson  ([id number]) -------------------------------------------------------------------------------- Physical Exam Details Patient Name: Ralph Peterson Date of Service: 06/02/2020 1:00 PM Medical Record Number: [id number] Patient Account Number: [id number] Date of Birth/Sex: 05/28/1966 (54 y.o. M) Treating RN: Army Melia Primary Care Provider: Vidal Schwalbe Other Clinician: Referring Provider: Vidal Schwalbe Treating Provider/Extender: Skipper Cliche in Treatment: 5 Constitutional Well-nourished and well-hydrated in no acute distress. Respiratory normal breathing without difficulty. Psychiatric this patient is able to make decisions and demonstrates good insight into disease process. Alert and Oriented x 3. pleasant and cooperative. Notes Upon inspection patient's wound bed actually showed signs of good granulation at this time at all wound locations. There was some slough noted especially on the dorsal aspect of the foot but I cleaned this away without complication today post debridement which was just mechanical the patient seems to be doing much better. In regard to the blister which he showed me on his right anterior lower extremity this did not appear to be a significant wound underneath I think this is simply a blister but I did however remove the blister tissue today so that I can put an alginate on this to help dry it up so hopefully will not have any ongoing issues here. If it still open come next week I may establish this as an actual open wound but I feel like it was mainly  skin underneath and not really anything open it which is very moist and wet skin from his lymphedema and weeping. Electronic Signature(s) Signed: 06/02/2020 2:12:27 PM By: Worthy Keeler PA-C Entered By: Worthy Keeler on 06/02/2020 14:12:27 Ralph Peterson ([id number]) -------------------------------------------------------------------------------- Physician Orders Details Patient Name:  Ralph Peterson Date of Service: 06/02/2020 1:00 PM Medical Record Number: [id number] Patient Account Number: [id number] Date of Birth/Sex: Oct 16, 1965 (54 y.o. M) Treating RN: Army Melia Primary Care Provider: Vidal Schwalbe Other Clinician: Referring Provider: Vidal Schwalbe Treating Provider/Extender: Skipper Cliche in Treatment: 5 Verbal / Phone Orders: No Diagnosis Coding ICD-10 Coding Code Description [phone] Pressure ulcer of other site, stage 3 T81.31XA Disruption of external operation (surgical) wound, not elsewhere classified, initial encounter 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d L97.522 Non-pressure chronic ulcer of other part of left foot with fat layer exposed E11.621 Type 2 diabetes mellitus with foot ulcer I50.42 Chronic combined systolic (congestive) and diastolic (congestive) heart failure N18.6 End stage renal disease Z99.2 Dependence on renal dialysis Wound Cleansing Wound #1 Right,Lateral Foot o Clean wound with Normal Saline. Wound #2 Right,Dorsal Foot o Clean wound with Normal Saline. Wound #5 Right,Plantar Foot o Clean wound with Normal Saline. Anesthetic (add to Medication List) Wound #1 Right,Lateral Foot o Topical Lidocaine 4% cream applied to wound bed prior to debridement (In Clinic Only). Wound #2 Right,Dorsal Foot o Topical Lidocaine 4% cream applied to wound bed prior to debridement (In Clinic Only). Wound #5 Right,Plantar Foot o Topical Lidocaine 4% cream applied to wound bed prior to debridement (In Clinic Only). Primary Wound Dressing Wound #1 Right,Lateral Foot o Mepitel One Contact layer o Silver Collagen Wound #2 Right,Dorsal Foot o Silver Alginate Wound #5 Right,Plantar Foot o Silver Alginate Secondary Dressing Wound #2 Right,Dorsal Foot o ABD and Kerlix/Conform Wound #5 Right,Plantar Foot o ABD and Kerlix/Conform Dressing Change Frequency Wound #1  Right,Lateral Foot o Change dressing every other day. Ralph Peterson, Ralph Peterson ([id number]) Wound #2 Right,Dorsal Foot o Change dressing every other day. Wound #5 Right,Plantar Foot o Change dressing every other day. Follow-up Appointments Wound #1 Right,Lateral Foot o Return Appointment in 1 week. Wound #2 Right,Dorsal Foot o Return Appointment in 1 week. Wound #5 Right,Plantar Foot o Return Appointment in 1 week. Edema Control Wound #1 Right,Lateral Foot o Elevate legs to the level of the heart and pump ankles as often as possible - Tubigrip E Wound #2 Right,Dorsal Foot o Elevate legs to the level of the heart and pump ankles as often as possible - Tubigrip E Wound #5 Right,Plantar Foot o Elevate legs to the level of the heart and pump ankles as often as possible o Other: - Tubigrip E Off-Loading Wound #1 Right,Lateral Foot o Other: - front off-loader right Wound #2 Right,Dorsal Foot o Other: - front off-loader right Wound #5 Right,Plantar Foot o Other: - front off-loader right Additional Orders / Instructions Wound #1 Right,Lateral Foot o Increase protein intake. Wound #2 Right,Dorsal Foot o Increase protein intake. Wound #5 Right,Plantar Foot o Increase protein intake. Electronic Signature(s) Signed: 06/02/2020 2:13:48 PM By: Worthy Keeler PA-C Entered By: Worthy Keeler on 06/02/2020 13:31:51 Ralph Peterson ([id number]) -------------------------------------------------------------------------------- Problem List Details Patient Name: Ralph Peterson Date of Service: 06/02/2020 1:00 PM Medical Record Number: [id number] Patient Account Number: [id number] Date of Birth/Sex: [birth year] (54 y.o. M) Treating RN: Army Melia Primary Care Provider: Vidal Schwalbe Other Clinician: Referring Provider: Vidal Schwalbe Treating Provider/Extender: Skipper Cliche  in Treatment: 5 Active Problems ICD-10 Encounter Code  Description Active Date MDM Diagnosis L89.893 Pressure ulcer of other site, stage 3 04/27/2020 No Yes T81.31XA Disruption of external operation (surgical) wound, not elsewhere 04/27/2020 No Yes classified, initial encounter L97.512 Non-pressure chronic ulcer of other part of right foot with fat layer 04/27/2020 No Yes exposed L97.522 Non-pressure chronic ulcer of other part of left foot with fat layer 04/27/2020 No Yes exposed E11.621 Type 2 diabetes mellitus with foot ulcer 04/27/2020 No Yes I50.42 Chronic combined systolic (congestive) and diastolic (congestive) heart 04/27/2020 No Yes failure N18.6 End stage renal disease 04/27/2020 No Yes Z99.2 Dependence on renal dialysis 04/27/2020 No Yes Inactive Problems Resolved Problems Electronic Signature(s) Signed: 06/02/2020 12:57:09 PM By: Worthy Keeler PA-C Entered By: Worthy Keeler on 06/02/2020 12:57:09 Ralph Peterson ([id number]) -------------------------------------------------------------------------------- Progress Note Details Patient Name: Ralph Peterson. Date of Service: 06/02/2020 1:00 PM Medical Record Number: [id number] Patient Account Number: [id number] Date of Birth/Sex: Aug 12, 1965 (54 y.o. 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 He does have a couple callus areas that have ulcerations underneath on the left foot. 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 Fortunately there is no sign of active infection at this time  which is good news he has been using Telfa pads after applying Betadine this is not really absorbing enough of the drainage however which is causing the area especially at the amputation site to be very macerated. The patient states that he actually has a work-up with Dr. Delana Meyer at vein and vascular next Thursday. For that reason we did not perform ABIs today and again I really am not doing any significant debridement today we will discontinue mild things to remove some of the necrotic debris so that we hopefully get these wounds moving in the proper direction. With that being said his extremities do appear to be warm without any signs obviously of significant arterial flow. The patient does have a history of diabetes mellitus type 2, congestive heart failure, end- stage renal disease, and dependence on renal dialysis. He notes that he did have an exacerbation of heart failure recently which caused his legs to swell he had a couple wounds on his left leg but fortunately these have actually improved and are healed as of today. 05/09/2020 on evaluation today patient appears to be doing decently well all things considered in regard to his wounds. He does have the sutures out at this point in regard to his right amputation site. That does seem to be showing signs of improvement which is good news from the standpoint of at least being able to get some of the slough out although he does have quite a bit of slough buildup on the surface of the wound here. I think that is can require debridement before were able to see dramatic filling in. He may even require wound VAC at the site. With regard to the lateral foot this seems to be loosening up with the Santyl and I am pleased in that regard. In regard to the left  foot everything appears to be healed and is doing great. 05/18/2020 on evaluation today patient appears to be doing well at this time in regard to his wounds. The wound on the lateral portion of his  foot and the dorsal surface of his foot has been require sharp debridement of plantar aspect which was separated out from the incision site from the ray amputation actually appears to be almost completely healed and is doing excellent. There is no signs of active infection at this time. 05/26/2020 on evaluation today patient appears to be actually showing signs of improvement in general in regard to his wounds. The plantar aspect wound does connect to the dorsal surface wound which I initially expected but after last week was hopeful that that would not be the case. I was in fact hoping it would completely sealed up this week but it does not seem to have happened. There is no signs of active infection at this time which is great news. With that being said he does still have tender noted on the lateral aspect of the foot where the wound is there but this does not seem to be nearly as slough covered in fact I was able to carefully clean this off today and other than the fact that we need to help seal up the tendon I do not see any signs of infection at this point. 06/02/2020 upon evaluation today patient actually is making excellent improvement in regard to all of his wounds. In fact the plantar aspect of his foot looks like it may have sealed up although I cannot be 956% certain in this regard. With that being said I do see where there was some drainage on his dressing but today I cannot find any real depth to the wound opening and it appears to be close to closed if not completely. Nonetheless I do think that the dorsal surface of the foot is doing excellent in fact we have gotten approval for snap VAC but I am not even sure that skin to be necessary he is healing so well already. In regard to the lateral foot I do feel like the collagen has been beneficial here and my recommendation is good to be that we likely continue as such with this. Tenderness still exposed but seems to be nice and moist I see no  issues in that regard. Objective Constitutional Well-nourished and well-hydrated in no acute distress. Vitals Time Taken: 1:00 PM, Height: 72 in, Weight: 225 lbs, BMI: 30.5, Temperature: 98.3 F, Pulse: 71 bpm, Respiratory Rate: 16 breaths/min, Blood Pressure: 150/89 mmHg. Respiratory normal breathing without difficulty. Psychiatric Ralph Peterson, Ralph Peterson ([id number]) this patient is able to make decisions and demonstrates good insight into disease process. Alert and Oriented x 3. pleasant and cooperative. General Notes: Upon inspection patient's wound bed actually showed signs of good granulation at this time at all wound locations. There was some slough noted especially on the dorsal aspect of the foot but I cleaned this away without complication today post debridement which was just mechanical the patient seems to be doing much better. In regard to the blister which he showed me on his right anterior lower extremity this did not appear to be a significant wound underneath I think this is simply a blister but I did however remove the blister tissue today so that I can put an alginate on this to help dry it up so hopefully will not have any ongoing issues here. If it still open come next week I  may establish this as an actual open wound but I feel like it was mainly skin underneath and not really anything open it which is very moist and wet skin from his lymphedema and weeping. Integumentary (Hair, Skin) Wound #1 status is Open. Original cause of wound was Pressure Injury. The wound is located on the Right,Lateral Foot. The wound measures 2.2cm length x 1.6cm width x 0.2cm depth; 2.765cm^2 area and 0.553cm^3 volume. There is joint, tendon, and Fat Layer (Subcutaneous Tissue) exposed. There is a medium amount of serosanguineous drainage noted. The wound margin is distinct with the outline attached to the wound base. There is large (67-100%) red granulation within the wound bed. There is a  small (1-33%) amount of necrotic tissue within the wound bed including Adherent Slough. Wound #2 status is Open. Original cause of wound was Surgical Injury. The wound is located on the Right,Dorsal Foot. The wound measures 3.5cm length x 0.7cm width x 0.5cm depth; 1.924cm^2 area and 0.962cm^3 volume. There is Fat Layer (Subcutaneous Tissue) exposed. There is a medium amount of serosanguineous drainage noted. The wound margin is distinct with the outline attached to the wound base. There is large (67-100%) red granulation within the wound bed. There is no necrotic tissue within the wound bed. Wound #5 status is Open. Original cause of wound was Surgical Injury. The wound is located on the [place]. The wound measures 0.1cm length x 0.1cm width x 0.1cm depth; 0.008cm^2 area and 0.001cm^3 volume. There is a small amount of serous drainage noted. The wound margin is distinct with the outline attached to the wound base. There is no granulation within the wound bed. There is no necrotic tissue within the wound bed. Other Condition(s) Patient presents with Other Dermatologic Condition located on the Right Leg. General Notes: Patient has a blister which was ruptured and drained today and alginate applied to the leg. Blister size is 5.8 x 3.0 cm Assessment Active Problems ICD-10 Pressure ulcer of other site, stage 3 Disruption of external operation (surgical) wound, not elsewhere classified, initial encounter Non-pressure chronic ulcer of other part of right foot with fat layer exposed Non-pressure chronic ulcer of other part of left foot with fat layer exposed Type 2 diabetes mellitus with foot ulcer Chronic combined systolic (congestive) and diastolic (congestive) heart failure End stage renal disease Dependence on renal dialysis Plan Wound Cleansing: Wound #1 Right,Lateral Foot: Clean wound with Normal Saline. Wound #2 Right,Dorsal Foot: Clean wound with Normal Saline. Wound #5  Right,Plantar Foot: Clean wound with Normal Saline. Anesthetic (add to Medication List): Wound #1 Right,Lateral Foot: Topical Lidocaine 4% cream applied to wound bed prior to debridement (In Clinic Only). Wound #2 Right,Dorsal Foot: Topical Lidocaine 4% cream applied to wound bed prior to debridement (In Clinic Only). Wound #5 Right,Plantar Foot: Topical Lidocaine 4% cream applied to wound bed prior to debridement (In Clinic Only). Primary Wound Dressing: Wound #1 Right,Lateral Foot: Mepitel One Contact layer Silver Collagen Wound #2 Right,Dorsal Foot: Silver Alginate Wound #5 Right,Plantar Foot: Ralph Peterson, Ralph Peterson ([id number]) Silver Alginate Secondary Dressing: Wound #2 Right,Dorsal Foot: ABD and Kerlix/Conform Wound #5 Right,Plantar Foot: ABD and Kerlix/Conform Dressing Change Frequency: Wound #1 Right,Lateral Foot: Change dressing every other day. Wound #2 Right,Dorsal Foot: Change dressing every other day. Wound #5 Right,Plantar Foot: Change dressing every other day. Follow-up Appointments: Wound #1 Right,Lateral Foot: Return Appointment in 1 week. Wound #2 Right,Dorsal Foot: Return Appointment in 1 week. Wound #5 Right,Plantar Foot: Return Appointment in 1 week. Edema Control: Wound #1  Right,Lateral Foot: Elevate legs to the level of the heart and pump ankles as often as possible - Tubigrip E Wound #2 Right,Dorsal Foot: Elevate legs to the level of the heart and pump ankles as often as possible - Tubigrip E Wound #5 Right,Plantar Foot: Elevate legs to the level of the heart and pump ankles as often as possible Other: - Tubigrip E Off-Loading: Wound #1 Right,Lateral Foot: Other: - front off-loader right Wound #2 Right,Dorsal Foot: Other: - front off-loader right Wound #5 Right,Plantar Foot: Other: - front off-loader right Additional Orders / Instructions: Wound #1 Right,Lateral Foot: Increase protein intake. Wound #2 Right,Dorsal Foot: Increase protein  intake. Wound #5 Right,Plantar Foot: Increase protein intake. 1. I would recommend currently that we go ahead and continue with the collagen for the lateral foot wound. 2. I am also can recommend that we go ahead and use the silver alginate for the plantar foot and dorsal foot wounds. We will also use a piece of this alginate on the patient's blistered area of the right leg. 3. I am also can recommend at this time that we have the patient continue with elevation is much as possible and offloading he is doing a great job with this using the wedge shoe. 4. I am going to use Tubigrip E on the patient to try to help keep some of the edema down hopefully prevent any additional blistering I see nothing that appears to be infected right now which is great news. We will see patient back for reevaluation in 1 week here in the clinic. If anything worsens or changes patient will contact our office for additional recommendations. Electronic Signature(s) Signed: 06/02/2020 2:13:18 PM By: Worthy Keeler PA-C Entered By: Worthy Keeler on 06/02/2020 14:13:17 Ralph Peterson ([id number]) -------------------------------------------------------------------------------- SuperBill Details Patient Name: Ralph Peterson Date of Service: 06/02/2020 Medical Record Number: [id number] Patient Account Number: [id number] Date of Birth/Sex: 05-11-1966 (54 y.o. 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 N18.6 End stage renal disease Z99.2 Dependence on renal dialysis Facility Procedures CPT4 Code: 38250539 Description: 99214 - WOUND CARE VISIT-LEV 4 EST PT Modifier: Quantity: 1 Physician Procedures CPT4 Code: 7673419 Description: 99214 - WC PHYS LEVEL 4 - EST PT Modifier: Quantity: 1 CPT4 Code: Description: ICD-10 Diagnosis Description L89.893 Pressure ulcer of other site, stage 3 T81.31XA Disruption of external operation (surgical) wound, not elsewhere classifi L97.512 Non-pressure chronic ulcer of other part of right foot with fat layer exp  L97.522 Non-pressure chronic ulcer of other part of left foot with fat layer expo Modifier: ed, initial encounter osed sed Quantity: Electronic Signature(s) Signed: 06/02/2020 2:13:48 PM By: Worthy Keeler PA-C Entered By: Worthy Keeler on 06/02/2020 13:52:42

## 2020-06-02 NOTE — Progress Notes (Signed)
DIEM, PAGNOTTA ([id number]) Visit Report for 06/02/2020 Arrival Information Details Patient Name: Ralph Peterson, Ralph Peterson. Date of Service: 06/02/2020 1:00 PM Medical Record Number: [id number] Patient Account Number: [id number] Date of Birth/Sex: 1966-01-18 (54 y.o. M) Treating RN: Army Melia Primary Care Maryelizabeth Eberle: Vidal Schwalbe Other Clinician: Referring Glenetta Kiger: Vidal Schwalbe Treating Merrell Borsuk/Extender: Skipper Cliche in Treatment: 5 Visit Information History Since Last Visit Added or deleted any medications: No Patient Arrived: Wheel Chair Any new allergies or adverse reactions: No Arrival Time: 12:59 Had a fall or experienced change in No Accompanied By: wife activities of daily living that may affect Transfer Assistance: None risk of falls: Patient Requires Transmission-Based Precautions: No Signs or symptoms of abuse/neglect since last visito No Patient Has Alerts: No Hospitalized since last visit: No Has Dressing in Place as Prescribed: Yes Has Compression in Place as Prescribed: Yes Has Footwear/Offloading in Place as Prescribed: Yes Right: Wedge Shoe Pain Present Now: No Electronic Signature(s) Unsigned Entered By: Worthy Keeler on 06/02/2020 13:00:46 Signature(s): Date(s): Ralph Peterson ([id number]) -------------------------------------------------------------------------------- Clinic Level of Care Assessment Details Patient Name: Ralph Peterson, Ralph Peterson. Date of Service: 06/02/2020 1:00 PM Medical Record Number: [id number] Patient Account Number: [id number] Date of Birth/Sex: 02/21/1966 (54 y.o. M) Treating RN: Army Melia Primary Care Daveigh Batty: Vidal Schwalbe Other Clinician: Referring [place]: Vidal Schwalbe Treating Mattias Walmsley/Extender: Skipper Cliche in Treatment: 5 Clinic Level of Care Assessment Items TOOL 4 Quantity Score []  - Use when only an EandM is performed on FOLLOW-UP visit 0 ASSESSMENTS - Nursing Assessment /  Reassessment X - Reassessment of Co-morbidities (includes updates in patient status) 1 10 X- 1 5 Reassessment of Adherence to Treatment Plan ASSESSMENTS - Wound and Skin Assessment / Reassessment []  - Simple Wound Assessment / Reassessment - one wound 0 X- 3 5 Complex Wound Assessment / Reassessment - multiple wounds X- 1 10 Dermatologic / Skin Assessment (not related to wound area) ASSESSMENTS - Focused Assessment []  - Circumferential Edema Measurements - multi extremities 0 []  - 0 Nutritional Assessment / Counseling / Intervention []  - 0 Lower Extremity Assessment (monofilament, tuning fork, pulses) []  - 0 Peripheral Arterial Disease Assessment (using hand held doppler) ASSESSMENTS - Ostomy and/or Continence Assessment and Care []  - Incontinence Assessment and Management 0 []  - 0 Ostomy Care Assessment and Management (repouching, etc.) PROCESS - Coordination of Care X - Simple Patient / Family Education for ongoing care 1 15 []  - 0 Complex (extensive) Patient / Family Education for ongoing care []  - 0 Staff obtains Programmer, systems, Records, Test Results / Process Orders []  - 0 Staff telephones HHA, Nursing Homes / Clarify orders / etc []  - 0 Routine Transfer to another Facility (non-emergent condition) []  - 0 Routine Hospital Admission (non-emergent condition) []  - 0 New Admissions / Biomedical engineer / Ordering NPWT, Apligraf, etc. []  - 0 Emergency Hospital Admission (emergent condition) []  - 0 Simple Discharge Coordination []  - 0 Complex (extensive) Discharge Coordination PROCESS - Special Needs []  - Pediatric / Minor Patient Management 0 []  - 0 Isolation Patient Management []  - 0 Hearing / Language / Visual special needs []  - 0 Assessment of Community assistance (transportation, D/C planning, etc.) []  - 0 Additional assistance / Altered mentation []  - 0 Support Surface(s) Assessment (bed, cushion, seat, etc.) INTERVENTIONS - Wound Cleansing /  Measurement Widmer, Deforrest S. ([id number]) []  - 0 Simple Wound Cleansing - one wound X- 3 5 Complex Wound Cleansing - multiple wounds []  - 0 Wound Imaging (photographs - any number of wounds) []  -  0 Wound Tracing (instead of photographs) []  - 0 Simple Wound Measurement - one wound X- 3 5 Complex Wound Measurement - multiple wounds INTERVENTIONS - Wound Dressings []  - Small Wound Dressing one or multiple wounds 0 X- 3 15 Medium Wound Dressing one or multiple wounds []  - 0 Large Wound Dressing one or multiple wounds []  - 0 Application of Medications - topical []  - 0 Application of Medications - injection INTERVENTIONS - Miscellaneous []  - External ear exam 0 []  - 0 Specimen Collection (cultures, biopsies, blood, body fluids, etc.) []  - 0 Specimen(s) / Culture(s) sent or taken to Lab for analysis []  - 0 Patient Transfer (multiple staff / Civil Service fast streamer / Similar devices) []  - 0 Simple Staple / Suture removal (25 or less) []  - 0 Complex Staple / Suture removal (26 or more) []  - 0 Hypo / Hyperglycemic Management (close monitor of Blood Glucose) []  - 0 Ankle / Brachial Index (ABI) - do not check if billed separately []  - 0 Vital Signs Has the patient been seen at the hospital within the last three years: Yes Total Score: 130 Level Of Care: New/Established - Level 4 Electronic Signature(s) Unsigned Entered By: Worthy Keeler on 06/02/2020 13:52:21 Signature(s): Date(s): Ralph Peterson ([id number]) -------------------------------------------------------------------------------- Encounter Discharge Information Details Patient Name: Ralph Peterson, Ralph Peterson. Date of Service: 06/02/2020 1:00 PM Medical Record Number: [id number] Patient Account Number: [id number] Date of Birth/Sex: 1965-10-08 (54 y.o. M) Treating RN: Army Melia Primary Care Zuhayr Deeney: Vidal Schwalbe Other Clinician: Referring Jesseka Drinkard: Vidal Schwalbe Treating Esha Fincher/Extender: Skipper Cliche  in Treatment: 5 Encounter Discharge Information Items Discharge Condition: Stable Ambulatory Status: Wheelchair Discharge Destination: Home Transportation: Private Auto Accompanied By: spouse Schedule Follow-up Appointment: Yes Clinical Summary of Care: Patient Declined Electronic Signature(s) Unsigned Entered By: Worthy Keeler on 06/02/2020 13:56:32 Signature(s): Date(s): Ralph Peterson ([id number]) -------------------------------------------------------------------------------- Multi Wound Chart Details Patient Name: Ralph Peterson, Ralph Peterson. Date of Service: 06/02/2020 1:00 PM Medical Record Number: [id number] Patient Account Number: [id number] Date of Birth/Sex: 1965/10/16 (54 y.o. M) Treating RN: Army Melia Primary Care Madalin Hughart: Vidal Schwalbe Other Clinician: Referring Tracey Stewart: Vidal Schwalbe Treating Chelisa Hennen/Extender: Skipper Cliche in Treatment: 5 Vital Signs Height(in): 22 Pulse(bpm): 42 Weight(lbs): 225 Blood Pressure(mmHg): 150/89 Body Mass Index(BMI): 31 Temperature(F): 98.3 Respiratory Rate(breaths/min): 16 Photos: Wound Location: Right, Lateral Foot Right, Dorsal Foot Right, Plantar Foot Wounding Event: Pressure Injury Surgical Injury Surgical Injury Primary Etiology: Pressure Ulcer Open Surgical Wound Diabetic Wound/Ulcer of the Lower Extremity Comorbid History: Arrhythmia, Congestive Heart Arrhythmia, Congestive Heart Arrhythmia, Congestive Heart Failure, Coronary Artery Disease, Failure, Coronary Artery Disease, Failure, Coronary Artery Disease, Hypertension, Myocardial Infarction, Hypertension, Myocardial Infarction, Hypertension, Myocardial Infarction, Peripheral Venous Disease, Type II Peripheral Venous Disease, Type II Peripheral Venous Disease, Type II Diabetes, Neuropathy Diabetes, Neuropathy Diabetes, Neuropathy Date Acquired: 02/20/2020 04/20/2020 03/24/2020 Weeks of Treatment: 5 5 2  Wound Status: Open Open Open Measurements L x W x D  (cm) 2.2x1.6x0.2 3.5x0.7x0.5 0.1x0.1x0.1 Area (cm) : 2.765 1.924 0.008 Volume (cm) : 0.553 0.962 0.001 % Reduction in Area: 45.30% 59.20% 98.80% % Reduction in Volume: 78.10% 32.00% 99.70% Classification: Category/Stage IV Full Thickness Without Exposed Grade 1 Support Structures Exudate Amount: Medium Medium Small Exudate Type: Serosanguineous Serosanguineous Serous Exudate Color: red, brown red, brown amber Wound Margin: Distinct, outline attached Distinct, outline attached Distinct, outline attached Granulation Amount: Large (67-100%) Large (67-100%) None Present (0%) Granulation Quality: Red Red N/A Necrotic Amount: Small (1-33%) None Present (0%) None Present (0%) Exposed Structures: Fat Layer (Subcutaneous Tissue): Fat Layer (  Subcutaneous Tissue): Fascia: No Yes Yes Fat Layer (Subcutaneous Tissue): Tendon: Yes Fascia: No No Joint: Yes Tendon: No Tendon: No Fascia: No Muscle: No Muscle: No Muscle: No Joint: No Joint: No Bone: No Bone: No Bone: No Epithelialization: None N/A None Treatment Notes Electronic Signature(s) Unsigned Entered By: Worthy Keeler on 06/02/2020 13:22:47 Ralph Peterson ([id number]) Signature(s): Date(s): Ralph Peterson ([id number]) -------------------------------------------------------------------------------- Multi-Disciplinary Care Plan Details Patient Name: Ralph Peterson, Ralph Peterson. Date of Service: 06/02/2020 1:00 PM Medical Record Number: [id number] Patient Account Number: [id number] Date of Birth/Sex: 05-31-1966 (54 y.o. M) Treating RN: Army Melia Primary Care Demisha Nokes: Vidal Schwalbe Other Clinician: Referring Lesleyann Fichter: Vidal Schwalbe Treating Bode Pieper/Extender: Skipper Cliche in Treatment: 5 Active Inactive Necrotic Tissue Nursing Diagnoses: Impaired tissue integrity related to necrotic/devitalized tissue Knowledge deficit related to management of necrotic/devitalized tissue Goals: Necrotic/devitalized  tissue will be minimized in the wound bed Date Initiated: 05/09/2020 Target Resolution Date: 05/09/2020 Goal Status: Active Interventions: Assess patient pain level pre-, during and post procedure and prior to discharge Provide education on necrotic tissue and debridement process Treatment Activities: Enzymatic debridement : 05/09/2020 Notes: Orientation to the Wound Care Program Nursing Diagnoses: Knowledge deficit related to the wound healing center program Goals: Patient/caregiver will verbalize understanding of the [place] [facility] Date Initiated: 04/27/2020 Target Resolution Date: 05/18/2020 Goal Status: Active Interventions: Provide education on orientation to the wound center Notes: Wound/Skin Impairment Nursing Diagnoses: Impaired tissue integrity Knowledge deficit related to ulceration/compromised skin integrity Goals: Patient/caregiver will verbalize understanding of skin care regimen Date Initiated: 04/27/2020 Target Resolution Date: 05/25/2020 Goal Status: Active Interventions: Assess patient/caregiver ability to obtain necessary supplies Assess patient/caregiver ability to perform ulcer/skin care regimen upon admission and as needed Assess ulceration(s) every visit Provide education on ulcer and skin care Treatment Activities: Skin care regimen initiated : 04/27/2020 JOEL, COWIN ([id number]) Topical wound management initiated : 04/27/2020 Notes: Electronic Signature(s) Unsigned Entered By: Worthy Keeler on 06/02/2020 13:22:37 Signature(s): Date(s): Ralph Peterson ([id number]) -------------------------------------------------------------------------------- Non-Wound Condition Assessment Details Patient Name: Ralph Peterson, Ralph Peterson. Date of Service: 06/02/2020 1:00 PM Medical Record Number: [id number] Patient Account Number: [id number] Date of Birth/Sex: Oct 21, 1965 (54 y.o. M) Treating RN: Army Melia Primary Care Wai Litt:  Vidal Schwalbe Other Clinician: Referring Jaelin Fackler: Vidal Schwalbe Treating Hoyt Leanos/Extender: Skipper Cliche in Treatment: 5 Non-Wound Condition: Condition: Other Dermatologic Condition Location: Leg Side: Right Notes: Blister Photos Notes Patient has a blister which was ruptured and drained today and alginate applied to the leg. Blister size is 5.8 x 3.0 cm Electronic Signature(s) Unsigned Entered By: Worthy Keeler on 06/02/2020 13:22:13 Signature(s): Date(s): RENDELL, THIVIERGE ([id number]) -------------------------------------------------------------------------------- Pain Assessment Details Patient Name: Ralph Peterson, Ralph Peterson. Date of Service: 06/02/2020 1:00 PM Medical Record Number: [id number] Patient Account Number: [id number] Date of Birth/Sex: 05/03/1966 (54 y.o. M) Treating RN: Army Melia Primary Care Ladesha Pacini: Vidal Schwalbe Other Clinician: Referring Nera Haworth: Vidal Schwalbe Treating Yanelli Zapanta/Extender: Skipper Cliche in Treatment: 5 Active Problems Location of Pain Severity and Description of Pain Patient Has Paino No Site Locations Pain Management and Medication Current Pain Management: Electronic Signature(s) Unsigned Entered By: Worthy Keeler on 06/02/2020 13:01:36 Signature(s): Date(s): Ralph Peterson ([id number]) -------------------------------------------------------------------------------- Patient/Caregiver Education Details Patient Name: Ralph Peterson Date of Service: 06/02/2020 1:00 PM Medical Record Number: [id number] Patient Account Number: [id number] Date of Birth/Gender: Jan 29, 1966 (54 y.o. M) Treating RN: Army Melia Primary Care Physician: Vidal Schwalbe Other Clinician: Referring Physician: Vidal Schwalbe Treating Physician/Extender: Skipper Cliche in Treatment: 5 Education Assessment Education Provided To:  Patient Education Topics Provided Wound/Skin Impairment: Handouts: Caring for Your  Ulcer Methods: Demonstration Responses: State content correctly Electronic Signature(s) Unsigned Entered By: Worthy Keeler on 06/02/2020 13:53:24 Signature(s): Date(s): Ralph Peterson ([id number]) -------------------------------------------------------------------------------- Wound Assessment Details Patient Name: Ralph Peterson, Ralph Peterson. Date of Service: 06/02/2020 1:00 PM Medical Record Number: [id number] Patient Account Number: [id number] Date of Birth/Sex: 1965-08-17 (54 y.o. M) Treating RN: Army Melia Primary Care Herb Beltre: Vidal Schwalbe Other Clinician: Referring Alegra Rost: Vidal Schwalbe Treating Daronte Shostak/Extender: Skipper Cliche in Treatment: 5 Wound Status Wound Number: 1 Primary Pressure Ulcer Etiology: Wound Location: Right, Lateral Foot Wound Open Wounding Event: Pressure Injury Status: Date Acquired: 02/20/2020 Comorbid Arrhythmia, Congestive Heart Failure, Coronary Artery Weeks Of Treatment: 5 History: Disease, Hypertension, Myocardial Infarction, Peripheral Clustered Wound: No Venous Disease, Type II Diabetes, Neuropathy Photos Wound Measurements Length: (cm) 2.2 Width: (cm) 1.6 Depth: (cm) 0.2 Area: (cm) 2.765 Volume: (cm) 0.553 % Reduction in Area: 45.3% % Reduction in Volume: 78.1% Epithelialization: None Wound Description Classification: Category/Stage IV Wound Margin: Distinct, outline attached Exudate Amount: Medium Exudate Type: Serosanguineous Exudate Color: red, brown Foul Odor After Cleansing: No Slough/Fibrino Yes Wound Bed Granulation Amount: Large (67-100%) Exposed Structure Granulation Quality: Red Fascia Exposed: No Necrotic Amount: Small (1-33%) Fat Layer (Subcutaneous Tissue) Exposed: Yes Necrotic Quality: Adherent Slough Tendon Exposed: Yes Muscle Exposed: No Joint Exposed: Yes Bone Exposed: No Treatment Notes Wound #1 (Right, Lateral Foot) 1. Cleansed with: Clean wound with Normal Saline May Shower, gently  pat wound dry prior to applying new dressing. 4. Dressing Applied: Aquacel Ag JOANATHAN, AFFELDT. ([id number]) Prisma Ag 5. Secondary Dressing Applied Bordered Foam Dressing ABD and Kerlix/Conform 6. Footwear/Offloading device applied Wedge shoe 7. Secured with Tubigrip Electronic Signature(s) Unsigned Entered By: Worthy Keeler on 06/02/2020 13:17:47 Signature(s): Date(s): NEALE, MARZETTE ([id number]) -------------------------------------------------------------------------------- Wound Assessment Details Patient Name: Ralph Peterson, Ralph Peterson. Date of Service: 06/02/2020 1:00 PM Medical Record Number: [id number] Patient Account Number: [id number] Date of Birth/Sex: March 26, 1966 (54 y.o. M) Treating RN: Army Melia Primary Care Evyn Putzier: Vidal Schwalbe Other Clinician: Referring Kamaiya Antilla: Vidal Schwalbe Treating Sharrell Krawiec/Extender: Skipper Cliche in Treatment: 5 Wound Status Wound Number: 2 Primary Open Surgical Wound Etiology: Wound Location: Right, Dorsal Foot Wound Open Wounding Event: Surgical Injury Status: Date Acquired: 04/20/2020 Comorbid Arrhythmia, Congestive Heart Failure, Coronary Artery Weeks Of Treatment: 5 History: Disease, Hypertension, Myocardial Infarction, Peripheral Clustered Wound: No Venous Disease, Type II Diabetes, Neuropathy Photos Wound Measurements Length: (cm) 3.5 Width: (cm) 0.7 Depth: (cm) 0.5 Area: (cm) 1.924 Volume: (cm) 0.962 % Reduction in Area: 59.2% % Reduction in Volume: 32% Wound Description Classification: Full Thickness Without Exposed Support Structu Wound Margin: Distinct, outline attached Exudate Amount: Medium Exudate Type: Serosanguineous Exudate Color: red, brown res Foul Odor After Cleansing: No Slough/Fibrino No Wound Bed Granulation Amount: Large (67-100%) Exposed Structure Granulation Quality: Red Fascia Exposed: No Necrotic Amount: None Present (0%) Fat Layer (Subcutaneous Tissue) Exposed:  Yes Tendon Exposed: No Muscle Exposed: No Joint Exposed: No Bone Exposed: No Treatment Notes Wound #2 (Right, Dorsal Foot) 1. Cleansed with: Clean wound with Normal Saline May Shower, gently pat wound dry prior to applying new dressing. 4. Dressing Applied: Aquacel Ag Ralph Peterson, Ralph Peterson. ([id number]) Prisma Ag 5. Secondary Dressing Applied Bordered Foam Dressing ABD and Kerlix/Conform 6. Footwear/Offloading device applied Wedge shoe 7. Secured with Tubigrip Electronic Signature(s) Unsigned Entered By: Worthy Keeler on 06/02/2020 13:18:41 Signature(s): Date(s): BARRY, FAIRCLOTH ([id number]) -------------------------------------------------------------------------------- Wound Assessment Details Patient Name: Ralph Peterson, Ralph Peterson. Date of Service: 06/02/2020 1:00  PM Medical Record Number: [id number] Patient Account Number: [id number] Date of Birth/Sex: 03/23/1966 (54 y.o. M) Treating RN: Army Melia Primary Care Mariano Doshi: Vidal Schwalbe Other Clinician: Referring Navjot Pilgrim: Vidal Schwalbe Treating Jon Lall/Extender: Skipper Cliche in Treatment: 5 Wound Status Wound Number: 5 Primary Diabetic Wound/Ulcer of the Lower Extremity Etiology: Wound Location: Right, Plantar Foot Wound Open Wounding Event: Surgical Injury Status: Date Acquired: 03/24/2020 Comorbid Arrhythmia, Congestive Heart Failure, Coronary Artery Weeks Of Treatment: 2 History: Disease, Hypertension, Myocardial Infarction, Peripheral Clustered Wound: No Venous Disease, Type II Diabetes, Neuropathy Photos Wound Measurements Length: (cm) 0.1 Width: (cm) 0.1 Depth: (cm) 0.1 Area: (cm) 0.008 Volume: (cm) 0.001 % Reduction in Area: 98.8% % Reduction in Volume: 99.7% Epithelialization: None Wound Description Classification: Grade 1 Wound Margin: Distinct, outline attached Exudate Amount: Small Exudate Type: Serous Exudate Color: amber Foul Odor After Cleansing: No Slough/Fibrino  Yes Wound Bed Granulation Amount: None Present (0%) Exposed Structure Necrotic Amount: None Present (0%) Fascia Exposed: No Fat Layer (Subcutaneous Tissue) Exposed: No Tendon Exposed: No Muscle Exposed: No Joint Exposed: No Bone Exposed: No Treatment Notes Wound #5 (Right, Plantar Foot) 1. Cleansed with: Clean wound with Normal Saline May Shower, gently pat wound dry prior to applying new dressing. 4. Dressing Applied: Aquacel Ag NAQUAN, GARMAN. ([id number]) Prisma Ag 5. Secondary Dressing Applied Bordered Foam Dressing ABD and Kerlix/Conform 6. Footwear/Offloading device applied Wedge shoe 7. Secured with Tubigrip Electronic Signature(s) Unsigned Entered By: Worthy Keeler on 06/02/2020 13:19:27 Signature(s): Date(s): Ralph Peterson ([id number]) -------------------------------------------------------------------------------- Vitals Details Patient Name: TIMO, HARTWIG. Date of Service: 06/02/2020 1:00 PM Medical Record Number: [id number] Patient Account Number: [id number] Date of Birth/Sex: 10/03/1965 (54 y.o. M) Treating RN: Army Melia Primary Care Jerrell Mangel: Vidal Schwalbe Other Clinician: Referring Nikki Glanzer: Vidal Schwalbe Treating Camdynn Maranto/Extender: Skipper Cliche in Treatment: 5 Vital Signs Time Taken: 13:00 Temperature (F): 98.3 Height (in): 72 Pulse (bpm): 71 Weight (lbs): 225 Respiratory Rate (breaths/min): 16 Body Mass Index (BMI): 30.5 Blood Pressure (mmHg): 150/89 Reference Range: 80 - 120 mg / dl Electronic Signature(s) Unsigned Entered By: Worthy Keeler on 06/02/2020 13:01:20 Signature(s): Date(s):

## 2020-06-06 ENCOUNTER — Ambulatory Visit (INDEPENDENT_AMBULATORY_CARE_PROVIDER_SITE_OTHER): Payer: Medicare PPO | Admitting: Podiatry

## 2020-06-06 ENCOUNTER — Encounter: Payer: Self-pay | Admitting: Podiatry

## 2020-06-06 ENCOUNTER — Other Ambulatory Visit: Payer: Self-pay

## 2020-06-06 DIAGNOSIS — L97512 Non-pressure chronic ulcer of other part of right foot with fat layer exposed: Secondary | ICD-10-CM

## 2020-06-06 DIAGNOSIS — Z89421 Acquired absence of other right toe(s): Secondary | ICD-10-CM | POA: Diagnosis not present

## 2020-06-06 NOTE — Progress Notes (Signed)
Subjective:  Patient ID: Ralph Peterson, male    DOB: 02/13/1966,  MRN: [id number]  No chief complaint on file.   54 y.o. male returns for post-op check.  Patient is doing well.  He has been keeping the bandages clean dry and intact.  Overall no acute complaints no clinical signs of infection.  He has been going to the wound care center for wound management.  Pain is well controlled.  Review of Systems: Negative except as noted in the HPI. Denies N/V/F/Ch.  Past Medical History:  Diagnosis Date  . Anxiety   . Arrhythmia    atrial fibrillation  . CAD (coronary artery disease)    a. 09/2010 Cath/PCI ([facility]): LM nl, LAD 42m, D1 80 (small), LCX 20m, OM1 30, RI 70 (small), RCA 70 (DES).  . CHF (congestive heart failure) ([place])   . Coronary artery disease   . COVID-19 virus infection 07/2019  . Diabetes mellitus without complication ([place])   . ESRD (end stage renal disease) ([place])   . Hyperlipidemia   . Hypertension   . Ischemic cardiomyopathy    a.  12/2011 Echo ([facility]): NL EF, mod LVH. Mild AS/MS, triv PR/TR. 07/2019 Echo: EF 25-30%, GR1 DD, inf/post HK, low nl RV fxn, mildly dil RA, triv MR, mild Ao sclerosis w/o stenosis.  . NSTEMI (non-ST elevated myocardial infarction) ([place]) 07/2019  . PAD (peripheral artery disease) ([place])    a. 03/2016 s/p PTA/DEB R SFA/popliteal/peroneal; b. 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 08/2017 Atherectomy/DEB dist L SFA/popliteal. PTA of L AT; d. 06/2018 PTA/DEB L SFA/popliteal/PT/AT; e. 01/2019 Stable ABIs.  . Sleep apnea     Current Outpatient Medications:  .  amiodarone (PACERONE) 200 MG tablet, Take 200 mg by mouth 2 (two) times daily., Disp: , Rfl:  .  apixaban (ELIQUIS) 5 MG TABS tablet, Take 1 tablet (5 mg total) by mouth 2 (two) times daily., Disp: 60 tablet, Rfl: 3 .  atorvastatin (LIPITOR) 40 MG tablet, Take 40 mg by mouth every evening. , Disp: , Rfl: 1 .  cinacalcet (SENSIPAR) 30 MG tablet, Take 30 mg by mouth daily., Disp: , Rfl:  .   escitalopram (LEXAPRO) 20 MG tablet, Take 20 mg by mouth daily. , Disp: , Rfl:  .  famotidine (PEPCID) 20 MG tablet, Take 20 mg by mouth daily., Disp: , Rfl:  .  glipiZIDE (GLUCOTROL) 5 MG tablet, Take 5 mg by mouth daily before breakfast. , Disp: , Rfl:  .  insulin detemir (LEVEMIR) 100 UNIT/ML injection, Inject 0.05 mLs (5 Units total) into the skin 2 (two) times daily., Disp: 10 mL, Rfl: 3 .  levofloxacin (LEVAQUIN) 500 MG tablet, , Disp: , Rfl:  .  loperamide (IMODIUM) 2 MG capsule, Take 2 mg by mouth as needed for diarrhea or loose stools., Disp: , Rfl:  .  metoprolol succinate (TOPROL-XL) 50 MG 24 hr tablet, , Disp: , Rfl:  .  montelukast (SINGULAIR) 10 MG tablet, Take 10 mg by mouth daily., Disp: , Rfl:  .  SANTYL ointment, , Disp: , Rfl:  .  sevelamer carbonate (RENVELA) 800 MG tablet, Take 1,600-3,200 mg by mouth See admin instructions. Take 4 tablets (3200mg ) three times daily with meals and take 1 tablet (800mg ) by mouth with snacks, Disp: , Rfl:   Social History   Tobacco Use  Smoking Status Former Smoker  . Types: Cigarettes  . Quit date: 2010  . Years since quitting: 11.8  Smokeless Tobacco Never Used  Allergies  Allergen Reactions  . Penicillins Rash    Has patient had a PCN reaction causing immediate rash, facial/tongue/throat swelling, SOB or lightheadedness with hypotension: Yes Has patient had a PCN reaction causing severe rash involving mucus membranes or skin necrosis: Yes Has patient had a PCN reaction that required hospitalization: No Has patient had a PCN reaction occurring within the last 10 years: No If all of the above answers are "NO", then may proceed with Cephalosporin use.    Objective:   There were no vitals filed for this visit. There is no height or weight on file to calculate BMI. Constitutional Well developed. Well nourished.  Vascular Foot warm and well perfused. Capillary refill normal to all digits.   Neurologic Normal speech. Oriented  to person, place, and time. Epicritic sensation to light touch grossly present bilaterally.  Dermatologic Skin healing well without signs of infection. Skin edges well coapted without signs of infection.  Granular ulcer noted to the right lateral foot no exposure of bone.  Periwound is within normal limits.  No clinical signs of infection noted.  No purulent drainage noted.  Orthopedic: Tenderness to palpation noted about the surgical site.   Radiographs: 3 views of skeletally mature the right foot: Sharp surgical margins noted at the amputation site of the second ray.  No radiographic signs of osteomyelitis noted at this time.  However I am concerned for possible osteomyelitis to the right lateral fifth metatarsal head Assessment:   1. 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 Right foot ulcer, with fat layer exposed ([place])    Plan:  Patient was evaluated and treated and all questions answered.  S/p foot surgery right -Progressing as expected post-operatively. -XR: See above -WB Status: Weightbearing as tolerated in [place] wedge shoe -Sutures: Removed.  Dehiscence is healing well.  Granular wound noted.  Being primarily managed at the wound care center -Medications: None -The foot was being dressed with silver alginate dressing as per wound care instructions.  I will continue to monitor the patient from periphery if there is regression especially of the right lateral foot ulcer patient may need to undergo transmetatarsal amputation.  Patient states understanding  Right lateral foot ulcer with fat layer exposed -Patient the etiology of ulceration versus treatment options were discussed.  I believe patient will benefit from aggressive debridement of the ulcer with application of Santyl.  Patient states understanding -After reviewing the radiographs I am worried that patient may have possible osteomyelitis of the fifth metatarsal head.  Given that he has other ray  amputation he is at high risk of undergoing transmetatarsal amputation at this time. -His wound is being managed by the [facility] center at [facility].   No follow-ups on file.     No follow-ups on file.

## 2020-06-09 ENCOUNTER — Other Ambulatory Visit
Admission: RE | Admit: 2020-06-09 | Discharge: 2020-06-09 | Disposition: A | Discharge status: Home / Self Care | Payer: Medicare PPO | Source: Ambulatory Visit | Attending: Physician Assistant | Admitting: Physician Assistant

## 2020-06-09 ENCOUNTER — Other Ambulatory Visit: Payer: Self-pay

## 2020-06-09 ENCOUNTER — Encounter: Payer: Medicare PPO | Admitting: Physician Assistant

## 2020-06-09 DIAGNOSIS — L89893 Pressure ulcer of other site, stage 3: Secondary | ICD-10-CM | POA: Diagnosis not present

## 2020-06-09 NOTE — Progress Notes (Signed)
Ralph Peterson ([id number]) Visit Report for 06/09/2020 Arrival Information Details Patient Name: Ralph Peterson. Date of Service: 06/09/2020 3:15 PM Medical Record Number: [id number] Patient Account Number: [id number] Date of Birth/Sex: Nov 14, 1965 (54 y.o. M) Treating RN: Ralph Peterson Primary Care Ralph Peterson: Ralph Peterson Other Clinician: Referring Ralph Peterson: Ralph Peterson Treating Ralph Peterson/Extender: Ralph Peterson in Treatment: 6 Visit Information History Since Last Visit Pain Present Now: No Patient Arrived: Wheel Chair Arrival Time: 15:08 Accompanied By: aunt Transfer Assistance: None Patient Requires Transmission-Based Precautions: No Patient Has Alerts: No Electronic Signature(s) Signed: 06/09/2020 4:49:21 PM By: Ralph Peterson, Ralph Peterson Entered By: Ralph Peterson, Ralph Peterson on 06/09/2020 15:28:10 Ralph Peterson ([id number]) -------------------------------------------------------------------------------- Clinic Level of Care Assessment Details Patient Name: Ralph Peterson Date of Service: 06/09/2020 3:15 PM Medical Record Number: [id number] Patient Account Number: [id number] Date of Birth/Sex: 1965/09/17 (54 y.o. M) Treating RN: Ralph Peterson Primary Care Derry Arbogast: Ralph Peterson Other Clinician: Referring Ralph Peterson: Ralph Peterson Treating Ralph Peterson/Extender: Ralph Peterson in Treatment: 6 Clinic Level of Care Assessment Items TOOL 4 Quantity Score X - Use when only an EandM is performed on FOLLOW-UP visit 1 0 ASSESSMENTS - Nursing Assessment / Reassessment X - Reassessment of Co-morbidities (includes updates in patient status) 1 10 X- 1 5 Reassessment of Adherence to Treatment Plan ASSESSMENTS - Wound and Skin Assessment / Reassessment []  - Simple Wound Assessment / Reassessment - one wound 0 X- 4 5 Complex Wound Assessment / Reassessment - multiple wounds []  - 0 Dermatologic / Skin Assessment (not related to wound area) ASSESSMENTS -  Focused Assessment []  - Circumferential Edema Measurements - multi extremities 0 []  - 0 Nutritional Assessment / Counseling / Intervention []  - 0 Lower Extremity Assessment (monofilament, tuning fork, pulses) []  - 0 Peripheral Arterial Disease Assessment (using hand held doppler) ASSESSMENTS - Ostomy and/or Continence Assessment and Care []  - Incontinence Assessment and Management 0 []  - 0 Ostomy Care Assessment and Management (repouching, etc.) PROCESS - Coordination of Care X - Simple Patient / Family Education for ongoing care 1 15 []  - 0 Complex (extensive) Patient / Family Education for ongoing care []  - 0 Staff obtains Programmer, systems, Records, Test Results / Process Orders []  - 0 Staff telephones HHA, Nursing Homes / Clarify orders / etc []  - 0 Routine Transfer to another Facility (non-emergent condition) []  - 0 Routine Hospital Admission (non-emergent condition) []  - 0 New Admissions / Biomedical engineer / Ordering NPWT, Apligraf, etc. []  - 0 Emergency Hospital Admission (emergent condition) X- 1 10 Simple Discharge Coordination []  - 0 Complex (extensive) Discharge Coordination PROCESS - Special Needs []  - Pediatric / Minor Patient Management 0 []  - 0 Isolation Patient Management []  - 0 Hearing / Language / Visual special needs []  - 0 Assessment of Community assistance (transportation, D/C planning, etc.) []  - 0 Additional assistance / Altered mentation []  - 0 Support Surface(s) Assessment (bed, cushion, seat, etc.) INTERVENTIONS - Wound Cleansing / Measurement Sonoda, Erland S. ([id number]) []  - 0 Simple Wound Cleansing - one wound X- 4 5 Complex Wound Cleansing - multiple wounds []  - 0 Wound Imaging (photographs - any number of wounds) []  - 0 Wound Tracing (instead of photographs) []  - 0 Simple Wound Measurement - one wound []  - 0 Complex Wound Measurement - multiple wounds INTERVENTIONS - Wound Dressings []  - Small Wound Dressing one or  multiple wounds 0 []  - 0 Medium Wound Dressing one or multiple wounds X- 1 20 Large Wound Dressing one or multiple wounds []  - 0 Application of  Medications - topical []  - 0 Application of Medications - injection INTERVENTIONS - Miscellaneous []  - External ear exam 0 X- 1 5 Specimen Collection (cultures, biopsies, blood, body fluids, etc.) X- 1 5 Specimen(s) / Culture(s) sent or taken to Lab for analysis []  - 0 Patient Transfer (multiple staff / Civil Service fast streamer / Similar devices) []  - 0 Simple Staple / Suture removal (25 or less) []  - 0 Complex Staple / Suture removal (26 or more) []  - 0 Hypo / Hyperglycemic Management (close monitor of Blood Glucose) []  - 0 Ankle / Brachial Index (ABI) - do not check if billed separately X- 1 5 Vital Signs Has the patient been seen at the hospital within the last three years: Yes Total Score: 115 Level Of Care: New/Established - Level 3 Electronic Signature(s) Signed: 06/09/2020 4:49:21 PM By: Ralph Peterson, Ralph Peterson Entered By: Ralph Peterson, Ralph Peterson on 06/09/2020 16:44:49 Ralph Peterson ([id number]) -------------------------------------------------------------------------------- Encounter Discharge Information Details Patient Name: Ralph Peterson Date of Service: 06/09/2020 3:15 PM Medical Record Number: [id number] Patient Account Number: [id number] Date of Birth/Sex: Aug 02, 1965 (54 y.o. M) Treating RN: Ralph Peterson Primary Care Ralph Peterson: Ralph Peterson Other Clinician: Referring Ralph Peterson: Ralph Peterson Treating Ralph Peterson/Extender: Ralph Peterson in Treatment: 6 Encounter Discharge Information Items Discharge Condition: Stable Ambulatory Status: Wheelchair Discharge Destination: Home Transportation: Private Auto Accompanied By: aunt Schedule Follow-up Appointment: Yes Clinical Summary of Care: Electronic Signature(s) Signed: 06/09/2020 4:48:13 PM By: Ralph Peterson, Ralph Peterson Entered By: Ralph Peterson, Ralph Peterson on  06/09/2020 16:48:13 Ralph Peterson ([id number]) -------------------------------------------------------------------------------- Lower Extremity Assessment Details Patient Name: Ralph Peterson. Date of Service: 06/09/2020 3:15 PM Medical Record Number: [id number] Patient Account Number: [id number] Date of Birth/Sex: [birth year] (54 y.o. M) Treating RN: Ralph Peterson Primary Care Harwood Nall: Ralph Peterson Other Clinician: Referring Kashauna Celmer: Ralph Peterson Treating Tayloranne Lekas/Extender: Jeri Cos Weeks in Treatment: 6 Edema Assessment Assessed: [Left: No] [Right: Yes] Edema: [Left: N] [Right: o] Electronic Signature(s) Signed: 06/09/2020 4:49:21 PM By: Ralph Peterson, Ralph Peterson Entered By: Ralph Peterson, Ralph Peterson on 06/09/2020 15:49:25 Ralph Peterson ([id number]) -------------------------------------------------------------------------------- Multi Wound Chart Details Patient Name: Ralph Peterson Date of Service: 06/09/2020 3:15 PM Medical Record Number: [id number] Patient Account Number: [id number] Date of Birth/Sex: [birth year] (54 y.o. 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 Comorbid History: Arrhythmia, Congestive Heart Arrhythmia, Congestive Heart Arrhythmia, Congestive Heart Failure, Coronary Artery Disease, Failure, Coronary Artery Disease, Failure, Coronary Artery Disease, Hypertension, Myocardial  Infarction, Hypertension, Myocardial Infarction, Hypertension, Myocardial Infarction, Peripheral Venous Disease, Type II Peripheral Venous Disease, Type II Peripheral Venous Disease, Type II Diabetes, Neuropathy Diabetes, Neuropathy Diabetes, Neuropathy Date Acquired: 02/20/2020 04/20/2020 03/24/2020 Weeks of Treatment: 6 6 3  Wound Status: Open Open Open Measurements L x W x D (cm) 2x1.6x0.2 3x0.7x0.4 0.1x0.1x2.6 Area (cm) : 2.513 1.649 0.008 Volume (cm) : 0.503 0.66 0.02 % Reduction in Area: 50.30% 65.00% 98.80% % Reduction in Volume: 80.10% 53.30% 94.00% Classification: Category/Stage IV Full Thickness Without Exposed Grade 1 Support Structures Exudate Amount: Medium Medium Small Exudate Type: Serosanguineous Serosanguineous Serous Exudate Color: red, brown red, brown amber Wound Margin: Distinct, outline attached Distinct, outline attached Distinct, outline  attached Granulation Amount: Large (67-100%) Large (67-100%) None Present (0%) Granulation Quality: Red Red N/A Necrotic Amount: Small (1-33%) Small (1-33%) None Present (0%) Exposed Structures: Fat Layer (Subcutaneous Tissue): Fat Layer (Subcutaneous Tissue): Fascia: No Yes Yes Fat Layer (Subcutaneous Tissue): Tendon: Yes Fascia: No No Joint: Yes Tendon: No Tendon: No Fascia: No Muscle: No Muscle: No Muscle: No Joint: No Joint: No Bone: No Bone: No Bone: No Epithelialization: None Small (1-33%) None Wound Number: 6 N/A N/A Photos: N/A N/A DARRY, KELNHOFER ([id number]) Wound Location: Right, Proximal, Anterior Lower Leg N/A N/A Wounding Event: Gradually Appeared N/A N/A Primary Etiology: Diabetic Wound/Ulcer of the Lower N/A N/A Extremity Comorbid History: Arrhythmia, Congestive Heart N/A N/A Failure, Coronary Artery Disease, Hypertension, Myocardial Infarction, Peripheral Venous Disease, Type II Diabetes, Neuropathy Date Acquired: 06/02/2020 N/A N/A Weeks of Treatment: 0 N/A N/A Wound Status: Open N/A  N/A Measurements L x W x D (cm) 5.4x3x0.1 N/A N/A Area (cm) : 12.723 N/A N/A Volume (cm) : 1.272 N/A N/A % Reduction in Area: N/A N/A N/A % Reduction in Volume: N/A N/A N/A Classification: Grade 1 N/A N/A Exudate Amount: Medium N/A N/A Exudate Type: Serosanguineous N/A N/A Exudate Color: red, brown N/A N/A Wound Margin: Flat and Intact N/A N/A Granulation Amount: Large (67-100%) N/A N/A Granulation Quality: Red N/A N/A Necrotic Amount: None Present (0%) N/A N/A Exposed Structures: Fat Layer (Subcutaneous Tissue): N/A N/A Yes Fascia: No Tendon: No Muscle: No Joint: No Bone: No Epithelialization: None N/A N/A Treatment Notes Electronic Signature(s) Signed: 06/09/2020 4:39:58 PM By: Ralph Peterson, Ralph Peterson Entered By: Ralph Peterson, Ralph Peterson on 06/09/2020 16:39:58 Ralph Peterson ([id number]) -------------------------------------------------------------------------------- [place] Details Patient Name: Ralph Peterson Date of Service: 06/09/2020 3:15 PM Medical Record Number: [id number] Patient Account Number: [id number] Date of Birth/Sex: [birth year] (54 y.o. M) Treating RN: Ralph Peterson Primary Care Tekesha Almgren: Ralph Peterson Other Clinician: Referring Uva Runkel: Ralph Peterson Treating Kano Heckmann/Extender: Ralph Peterson in Treatment: 6 Active Inactive Necrotic Tissue Nursing Diagnoses: Impaired tissue integrity related to necrotic/devitalized tissue Knowledge deficit related to management of necrotic/devitalized tissue Goals: Necrotic/devitalized tissue will be minimized in the wound bed Date Initiated: 05/09/2020 Target Resolution Date: 05/09/2020 Goal Status: Active Interventions: Assess patient pain level pre-, during and post procedure and prior to discharge Provide education on necrotic tissue and debridement process Treatment Activities: Enzymatic debridement : 05/09/2020 Notes: Orientation to the Wound Care Program Nursing  Diagnoses: Knowledge deficit related to the wound healing center program Goals: Patient/caregiver will verbalize understanding of the [place] Program Date Initiated: 04/27/2020 Target Resolution Date: 05/18/2020 Goal Status: Active Interventions: Provide education on orientation to the wound center Notes: Wound/Skin Impairment Nursing Diagnoses: Impaired tissue integrity Knowledge deficit related to ulceration/compromised skin integrity Goals: Patient/caregiver will verbalize understanding of skin care regimen Date Initiated: 04/27/2020 Target Resolution Date: 05/25/2020 Goal Status: Active Interventions: Assess patient/caregiver ability to obtain necessary supplies Assess patient/caregiver ability to perform ulcer/skin care regimen upon admission and as needed Assess ulceration(s) every visit Provide education on ulcer and skin care Treatment Activities: Skin care regimen initiated : 04/27/2020 BURWELL, BETHEL ([id number]) Topical wound management initiated : 04/27/2020 Notes: Electronic Signature(s) Signed: 06/09/2020 4:39:49 PM By: Ralph Peterson, Ralph Peterson Entered By: Ralph Peterson, Ralph Peterson on 06/09/2020 16:39:49 Ralph Peterson ([id number]) -------------------------------------------------------------------------------- Pain Assessment Details Patient Name: Ralph Peterson. Date of Service: 06/09/2020 3:15 PM Medical Record Number: [id number] Patient Account Number: [id number] Date of Birth/Sex: 10/15/1965 (54 y.o. M) Treating RN: Ralph Peterson Primary Care [place] Ducre: Ralph Peterson Other Clinician:  Referring Rosangelica Pevehouse: Ralph Peterson Treating Davette Nugent/Extender: Ralph Peterson in Treatment: 6 Active Problems Location of Pain Severity and Description of Pain Patient Has Paino No Site Locations Rate the pain. Current Pain Level: 0 Pain Management and Medication Current Pain Management: Electronic Signature(s) Signed: 06/09/2020 4:49:21 PM  By: Ralph Peterson, Ralph Peterson Entered By: Ralph Peterson, Ralph Peterson on 06/09/2020 15:28:21 Ralph Peterson ([id number]) -------------------------------------------------------------------------------- Patient/Caregiver Education Details Patient Name: Ralph Peterson Date of Service: 06/09/2020 3:15 PM Medical Record Number: [id number] Patient Account Number: [id number] Date of Birth/Gender: 10-04-1965 (54 y.o. M) Treating RN: Ralph Peterson Primary Care Physician: Ralph Peterson Other Clinician: Referring Physician: Vidal Peterson Treating Physician/Extender: Ralph Peterson in Treatment: 6 Education Assessment Education Provided To: Patient Education Topics Provided Infection: Methods: Explain/Verbal Responses: State content correctly Wound/Skin Impairment: Methods: Explain/Verbal Responses: State content correctly Electronic Signature(s) Signed: 06/09/2020 4:49:21 PM By: Ralph Peterson, Ralph Peterson Entered By: Ralph Peterson, Ralph Peterson on 06/09/2020 16:46:09 Ralph Peterson ([id number]) -------------------------------------------------------------------------------- Wound Assessment Details Patient Name: Ralph Peterson. Date of Service: 06/09/2020 3:15 PM Medical Record Number: [id number] Patient Account Number: [id number] Date of Birth/Sex: May 12, 1966 (54 y.o. M) Treating RN: Ralph Peterson Primary Care Hallee Mckenny: Ralph Peterson Other Clinician: Referring Tihanna Goodson: Ralph Peterson Treating Tereso Unangst/Extender: Ralph Peterson in Treatment: 6 Wound Status Wound Number: 1 Primary Pressure Ulcer Etiology: Wound Location: Right, Lateral Foot Wound Open Wounding Event: Pressure Injury Status: Date Acquired: 02/20/2020 Comorbid Arrhythmia, Congestive Heart Failure, Coronary Artery Weeks Of Treatment: 6 History: Disease, Hypertension, Myocardial Infarction, Peripheral Clustered Wound: No Venous Disease, Type II Diabetes, Neuropathy Photos Wound  Measurements Length: (cm) 2 % Redu Width: (cm) 1.6 % Redu Depth: (cm) 0.2 Epithe Area: (cm) 2.513 [place]ne Volume: (cm) 0.503 Under ction in Area: 50.3% ction in Volume: 80.1% lialization: None ling: No mining: No Wound Description Classification: Category/Stage IV Foul O Wound Margin: Distinct, outline attached Slough Exudate Amount: Medium Exudate Type: Serosanguineous Exudate Color: red, brown dor After Cleansing: No /Fibrino Yes Wound Bed Granulation Amount: Large (67-100%) Exposed Structure Granulation Quality: Red Fascia Exposed: No Necrotic Amount: Small (1-33%) Fat Layer (Subcutaneous Tissue) Exposed: Yes Necrotic Quality: Adherent Slough Tendon Exposed: Yes Muscle Exposed: No Joint Exposed: Yes Bone Exposed: No Treatment Notes Wound #1 (Right, Lateral Foot) Notes prisma, contact layer, BFD Electronic Signature(s) WISTER, HOEFLE ([id number]) Signed: 06/09/2020 4:49:21 PM By: Ralph Peterson, Ralph Peterson Entered By: Ralph Peterson, Ralph Peterson on 06/09/2020 15:46:57 Ralph Peterson ([id number]) -------------------------------------------------------------------------------- Wound Assessment Details Patient Name: Ralph Peterson. Date of Service: 06/09/2020 3:15 PM Medical Record Number: [id number] Patient Account Number: [id number] Date of Birth/Sex: Mar 06, 1966 (54 y.o. M) Treating RN: Ralph Peterson Primary Care Shalandra Leu: Ralph Peterson Other Clinician: Referring Jurnee Nakayama: Ralph Peterson Treating Ladon Heney/Extender: Ralph Peterson in Treatment: 6 Wound Status Wound Number: 2 Primary Open Surgical Wound Etiology: Wound Location: Right, Dorsal Foot Wound Open Wounding Event: Surgical Injury Status: Date Acquired: 04/20/2020 Comorbid Arrhythmia, Congestive Heart Failure, Coronary Artery Weeks Of Treatment: 6 History: Disease, Hypertension, Myocardial Infarction, Peripheral Clustered Wound: No Venous Disease, Type II Diabetes,  Neuropathy Photos Wound Measurements Length: (cm) 3 Width: (cm) 0.7 Depth: (cm) 0.4 Area: (cm) 1.649 Volume: (cm) 0.66 % Reduction in Area: 65% % Reduction in Volume: 53.3% Epithelialization: Small (1-33%) Tunneling: No Undermining: No Wound Description Classification: Full Thickness Without Exposed Support Structu Wound Margin: Distinct, outline attached Exudate Amount: Medium Exudate Type: Serosanguineous Exudate Color: red, brown res Foul Odor After Cleansing: No Slough/Fibrino Yes Wound Bed Granulation Amount: Large (67-100%) Exposed Structure Granulation Quality: Red Fascia Exposed: No  Necrotic Amount: Small (1-33%) Fat Layer (Subcutaneous Tissue) Exposed: Yes Necrotic Quality: Adherent Slough Tendon Exposed: No Muscle Exposed: No Joint Exposed: No Bone Exposed: No Treatment Notes Wound #2 (Right, Dorsal Foot) Notes scell, abd and conform Electronic Signature(s) MATTHEW, CINA ([id number]) Signed: 06/09/2020 4:49:21 PM By: Ralph Peterson, Ralph Peterson Entered By: Ralph Peterson, Ralph Peterson on 06/09/2020 15:47:43 Ralph Peterson ([id number]) -------------------------------------------------------------------------------- Wound Assessment Details Patient Name: Ralph Peterson. Date of Service: 06/09/2020 3:15 PM Medical Record Number: [id number] Patient Account Number: [id number] Date of Birth/Sex: 22-May-1966 (54 y.o. M) Treating RN: Ralph Peterson Primary Care Jerren Flinchbaugh: Ralph Peterson Other Clinician: Referring Jeneen Doutt: Ralph Peterson Treating Atiyah Bauer/Extender: Ralph Peterson in Treatment: 6 Wound Status Wound Number: 5 Primary Diabetic Wound/Ulcer of the Lower Extremity Etiology: Wound Location: Right, Plantar Foot Wound Open Wounding Event: Surgical Injury Status: Date Acquired: 03/24/2020 Comorbid Arrhythmia, Congestive Heart Failure, Coronary Artery Weeks Of Treatment: 3 History: Disease, Hypertension, Myocardial Infarction,  Peripheral Clustered Wound: No Venous Disease, Type II Diabetes, Neuropathy Photos Wound Measurements Length: (cm) 0.1 % Reduc Width: (cm) 0.1 % Reduc Depth: (cm) 2.6 Epithel Area: (cm) 0.008 Tunnel Volume: (cm) 0.02 Underm tion in Area: 98.8% tion in Volume: 94% ialization: None ing: No ining: No Wound Description Classification: Grade 1 Foul Od Wound Margin: Distinct, outline attached Slough/ Exudate Amount: Small Exudate Type: Serous Exudate Color: amber or After Cleansing: No Fibrino Yes Wound Bed Granulation Amount: None Present (0%) Exposed Structure Necrotic Amount: None Present (0%) Fascia Exposed: No Fat Layer (Subcutaneous Tissue) Exposed: No Tendon Exposed: No Muscle Exposed: No Joint Exposed: No Bone Exposed: No Treatment Notes Wound #5 (Right, Plantar Foot) Notes scell, abd and conform Electronic Signature(s) RICHARD, RITCHEY ([id number]) Signed: 06/09/2020 4:49:21 PM By: Ralph Peterson, Ralph Peterson Entered By: Ralph Peterson, Ralph Peterson on 06/09/2020 15:58:13 Ralph Peterson ([id number]) -------------------------------------------------------------------------------- Wound Assessment Details Patient Name: Ralph Peterson. Date of Service: 06/09/2020 3:15 PM Medical Record Number: [id number] Patient Account Number: [id number] Date of Birth/Sex: 1966/02/17 (54 y.o. M) Treating RN: Ralph Peterson Primary Care Jenness Stemler: Ralph Peterson Other Clinician: Referring Gaia Gullikson: Ralph Peterson Treating Gust Eugene/Extender: Ralph Peterson in Treatment: 6 Wound Status Wound Number: 6 Primary Diabetic Wound/Ulcer of the Lower Extremity Etiology: Wound Location: Right, Proximal, Anterior Lower Leg Wound Open Wounding Event: Gradually Appeared Status: Date Acquired: 06/02/2020 Comorbid Arrhythmia, Congestive Heart Failure, Coronary Artery Weeks Of Treatment: 0 History: Disease, Hypertension, Myocardial Infarction, Peripheral Clustered Wound:  No Venous Disease, Type II Diabetes, Neuropathy Photos Wound Measurements Length: (cm) 5.4 % R Width: (cm) 3 % R Depth: (cm) 0.1 Epi Area: (cm) 12.723 Tu Volume: (cm) 1.272 Un eduction in Area: eduction in Volume: thelialization: None nneling: No dermining: No Wound Description Classification: Grade 1 Fou Wound Margin: Flat and Intact Slo Exudate Amount: Medium Exudate Type: Serosanguineous Exudate Color: red, brown l Odor After Cleansing: No ugh/Fibrino No Wound Bed Granulation Amount: Large (67-100%) Exposed Structure Granulation Quality: Red Fascia Exposed: No Necrotic Amount: None Present (0%) Fat Layer (Subcutaneous Tissue) Exposed: Yes Tendon Exposed: No Muscle Exposed: No Joint Exposed: No Bone Exposed: No Treatment Notes Wound #6 (Right, Proximal, Anterior Lower Leg) Notes scell, abd, conform Electronic Signature(s) THELMER, LEGLER ([id number]) Signed: 06/09/2020 4:49:21 PM By: Ralph Peterson, Ralph Peterson Entered By: Ralph Peterson, Ralph Peterson on 06/09/2020 15:42:07 Ralph Peterson ([id number]) -------------------------------------------------------------------------------- Vitals Details Patient Name: Ralph Peterson Date of Service: 06/09/2020 3:15 PM Medical Record Number: [id number] Patient Account Number: [id number] Date of Birth/Sex: 08-05-1965 (54 y.o. M) Treating RN: Ralph Peterson Primary Care Alanea Woolridge:  Ralph Peterson Other Clinician: Referring Seung Nidiffer: Ralph Peterson Treating Fern Asmar/Extender: Ralph Peterson in Treatment: 6 Vital Signs Time Taken: 15:24 Temperature (F): 98.8 Height (in): 72 Pulse (bpm): 70 Weight (lbs): 225 Respiratory Rate (breaths/min): 16 Body Mass Index (BMI): 30.5 Blood Pressure (mmHg): 128/78 Reference Range: 80 - 120 mg / dl Electronic Signature(s) Signed: 06/09/2020 4:49:21 PM By: Ralph Peterson, Ralph Peterson Entered By: Ralph Peterson, Ralph Peterson on 06/09/2020 15:27:48

## 2020-06-09 NOTE — Progress Notes (Addendum)
DEAGEN, KRASS ([id number]) Visit Report for 06/09/2020 Chief Complaint Document Details Patient Name: Ralph Peterson, Ralph Peterson. Date of Service: 06/09/2020 3:15 PM Medical Record Number: [id number] Patient Account Number: [id number] Date of Birth/Sex: 1966/02/14 (54 y.o. 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 M) Treating RN: Cornell Barman Primary Care Provider: Vidal Schwalbe Other Clinician: Referring Provider: Vidal Schwalbe Treating Provider/Extender: Skipper Cliche in Treatment: 6 History of Present Illness HPI Description: He has been having with multiple ulcers of his feet. He does have a couple callus areas that have ulcerations underneath on the left foot. 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 Fortunately there is no sign of active infection at this time which is good news he has been using Telfa pads after applying Betadine this is not really absorbing enough of the drainage however which is causing the area especially at the amputation site to be very macerated. The patient states that he actually has a work-up with Dr. Delana Meyer at [facility] next Thursday. For that reason we did not perform ABIs today and again I really am not doing any significant debridement today we will discontinue mild things to remove some of the necrotic debris so that we hopefully get these wounds moving in the proper direction. With that being said his extremities do appear to be warm without any signs obviously of significant arterial flow. The patient does have a history of diabetes mellitus type 2, congestive heart failure, end- stage renal disease, and dependence on renal dialysis. He notes that he did have an exacerbation of heart failure recently which caused his legs to swell he had a couple wounds on his left leg but fortunately these have actually improved and are healed as of today. 05/09/2020 on evaluation today patient appears to be doing decently well all things considered in regard to his wounds. He does have the sutures out at this point in regard to his right amputation site. That does seem to be showing signs of improvement which is good news from the standpoint of at least being able to get some of the slough out although he does have quite a bit of slough buildup on the surface of the wound here. I think that is can require debridement before were able to see dramatic filling in. He may even require wound VAC at the site. With regard to the lateral foot this seems to be loosening up with the Santyl and I am pleased in that regard. In regard to the left foot everything appears to be healed and is doing great. 05/18/2020 on evaluation today patient appears to be doing well at this time in regard to his wounds. The wound on the lateral portion of his foot and the dorsal surface of his foot has been require sharp debridement of plantar aspect which was separated out from the  incision site from the ray amputation actually appears to be almost completely healed and is doing excellent. There is no signs of active infection at this  time. 05/26/2020 on evaluation today patient appears to be actually showing signs of improvement in general in regard to his wounds. The plantar aspect wound does connect to the dorsal surface wound which I initially expected but after last week was hopeful that that would not be the case. I was in fact hoping it would completely sealed up this week but it does not seem to have happened. There is no signs of active infection at this time which is great news. With that being said he does still have tender noted on the lateral aspect of the foot where the wound is there but this does not seem to be nearly as slough covered in fact I was able to carefully clean this off today and other than the fact that we need to help seal up the tendon I do not see any signs of infection at this point. 06/02/2020 upon evaluation today patient actually is making excellent improvement in regard to all of his wounds. In fact the plantar aspect of his foot looks like it may have sealed up although I cannot be 568% certain in this regard. With that being said I do see where there was some drainage on his dressing but today I cannot find any real depth to the wound opening and it appears to be close to closed if not completely. Nonetheless I do think that the dorsal surface of the foot is doing excellent in fact we have gotten approval for snap VAC but I am not even sure that skin to be necessary he is healing so well already. In regard to the lateral foot I do feel like the collagen has been beneficial here and my recommendation is good to be that we likely continue as such with this. Tenderness still exposed but seems to be nice and moist I see no issues in that regard. 06/09/2020 on evaluation today patient appears to be doing pretty well in regard to his foot ulcers although the plantar ulcer does appear to be deeper than what I was able to measure last week that is unfortunate. The lateral foot seems to be doing okay.  Fortunately there is no signs of active infection at this time. How are in regard to the blister on the lower leg on the right that opened last time this appears to be erythematous around and I feel like it is also warm to touch I am concerned about infection here. Electronic Signature(s) Signed: 06/09/2020 4:34:14 PM By: Worthy Keeler PA-C Entered By: Worthy Keeler on 06/09/2020 16:34:14 [name] ([id number]) -------------------------------------------------------------------------------- Physical Exam Details Patient Name: [name] Date of Service: 06/09/2020 3:15 PM Medical Record Number: [id number] Patient Account Number: [id number] Date of Birth/Sex: 03/20/1966 (54 y.o. M) Treating RN: Cornell Barman Primary Care Provider: Vidal Schwalbe Other Clinician: Referring Provider: Vidal Schwalbe Treating Provider/Extender: Skipper Cliche in Treatment: 6 Constitutional Well-nourished and well-hydrated in no acute distress. Respiratory normal breathing without difficulty. Psychiatric this patient is able to make decisions and demonstrates good insight into disease process. Alert and Oriented x 3. pleasant and cooperative. Notes Patient's wounds currently do not require any sharp debridement although I did obtain a wound culture due to the erythema and redness around the right leg anteriorly. I did mark around this with a permanent marker as well and if it spreads outside  of the marked line as far as the erythema is concerned patient was advised to go to the ER ASAP. Electronic Signature(s) Signed: 06/09/2020 4:34:30 PM By: Worthy Keeler PA-C Entered By: Worthy Keeler on 06/09/2020 16:34:30 [name] ([id number]) -------------------------------------------------------------------------------- Physician Orders Details Patient Name: [name] Date of Service: 06/09/2020 3:15 PM Medical Record Number: [id number] Patient Account  Number: [id number] Date of Birth/Sex: [birth year] (54 y.o. M) Treating RN: Dolan Amen Primary Care Provider: Vidal Schwalbe Other Clinician: Referring Provider: Vidal Schwalbe Treating Provider/Extender: Skipper Cliche in Treatment: 6 Verbal / Phone Orders: No Diagnosis Coding ICD-10 Coding Code Description [phone] Pressure ulcer of other site, stage 3 T81.31XA Disruption of external operation (surgical) wound, not elsewhere classified, initial encounter 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d L97.522 Non-pressure chronic ulcer of other part of left foot with fat layer exposed E11.621 Type 2 diabetes mellitus with foot ulcer I50.42 Chronic combined systolic (congestive) and diastolic (congestive) heart failure N18.6 End stage renal disease Z99.2 Dependence on renal dialysis Wound Cleansing Wound #1 Right,Lateral Foot o Clean wound with Normal Saline. Wound #2 Right,Dorsal Foot o Clean wound with Normal Saline. Wound #5 Right,Plantar Foot o Clean wound with Normal Saline. Wound #6 Right,Proximal,Anterior Lower Leg o Clean wound with Normal Saline. Anesthetic (add to Medication List) Wound #1 Right,Lateral Foot o Topical Lidocaine 4% cream applied to wound bed prior to debridement (In Clinic Only). Wound #2 Right,Dorsal Foot o Topical Lidocaine 4% cream applied to wound bed prior to debridement (In Clinic Only). Wound #5 Right,Plantar Foot o Topical Lidocaine 4% cream applied to wound bed prior to debridement (In Clinic Only). Wound #6 Right,Proximal,Anterior Lower Leg o Topical Lidocaine 4% cream applied to wound bed prior to debridement (In Clinic Only). Primary Wound Dressing Wound #1 Right,Lateral Foot o Contact layer o Silver Collagen Wound #2 Right,Dorsal Foot o Silver Alginate - pack small strip inside Wound #5 Right,Plantar Foot o Silver Alginate Wound #6 Right,Proximal,Anterior Lower Leg o  Silver Alginate Secondary Dressing Wound #1 Right,Lateral Foot o Boardered Foam Dressing Nace, Mackinley S. ([id number]) Wound #2 Right,Dorsal Foot o ABD and Kerlix/Conform Wound #5 Right,Plantar Foot o ABD and Kerlix/Conform Dressing Change Frequency Wound #1 Right,Lateral Foot o Change dressing every other day. Wound #2 Right,Dorsal Foot o Change dressing every other day. Wound #5 Right,Plantar Foot o Change dressing every other day. Follow-up Appointments Wound #1 Right,Lateral Foot o Return Appointment in 1 week. Wound #2 Right,Dorsal Foot o Return Appointment in 1 week. Wound #5 Right,Plantar Foot o Return Appointment in 1 week. Edema Control Wound #1 Right,Lateral Foot o Elevate legs to the level of the heart and pump ankles as often as possible - Tubigrip E Wound #2 Right,Dorsal Foot o Elevate legs to the level of the heart and pump ankles as often as possible - Tubigrip E Wound #5 Right,Plantar Foot o Elevate legs to the level of the heart and pump ankles as often as possible o Other: - Tubigrip E Off-Loading Wound #1 Right,Lateral Foot o Other: - front off-loader right Wound #2 Right,Dorsal Foot o Other: - front off-loader right Wound #5 Right,Plantar Foot o Other: - front off-loader right Additional Orders / Instructions Wound #1 Right,Lateral Foot o Increase protein intake. Wound #2 Right,Dorsal Foot o Increase protein intake. Wound #5 Right,Plantar Foot o Increase protein intake. Laboratory o Bacteria identified in Wound by Culture (MICRO) oooo LOINC Code: 9211-9 oooo Convenience Name: Wound culture routine Patient Medications Allergies: PCN  ZIDANE, RENNER ([id number]) Notifications Medication Indication Start End doxycycline hyclate 06/09/2020 DOSE 1 - oral 100 mg capsule - 1 capsule oral taken 2 times per day for 14 days Electronic Signature(s) Signed: 06/09/2020 4:49:21 PM By: Georges Mouse,  Minus Breeding Signed: 06/12/2020 5:51:33 PM By: Worthy Keeler PA-C Previous Signature: 06/09/2020 4:37:05 PM Version By: Worthy Keeler PA-C Entered By: Georges Mouse, Minus Breeding on 06/09/2020 16:42:49 [name] ([id number]) -------------------------------------------------------------------------------- Problem List Details Patient Name: [name]. Date of Service: 06/09/2020 3:15 PM Medical Record Number: [id number] Patient Account Number: [id number] Date of Birth/Sex: [birth year] (54 y.o. M) Treating RN: Cornell Barman Primary Care Provider: Vidal Schwalbe Other Clinician: Referring Provider: Vidal Schwalbe Treating Provider/Extender: Skipper Cliche in Treatment: 6 Active Problems ICD-10 Encounter Code Description Active Date MDM Diagnosis L89.893 Pressure ulcer of other site, stage 3 04/27/2020 No Yes T81.31XA Disruption of external operation (surgical) wound, not elsewhere 04/27/2020 No Yes classified, initial encounter L97.512 Non-pressure chronic ulcer of other part of right foot with fat layer 04/27/2020 No Yes exposed L97.522 Non-pressure chronic ulcer of other part of left foot with fat layer 04/27/2020 No Yes exposed E11.621 Type 2 diabetes mellitus with foot ulcer 04/27/2020 No Yes I50.42 Chronic combined systolic (congestive) and diastolic (congestive) heart 04/27/2020 No Yes failure N18.6 End stage renal disease 04/27/2020 No Yes Z99.2 Dependence on renal dialysis 04/27/2020 No Yes Inactive Problems Resolved Problems Electronic Signature(s) Signed: 06/09/2020 3:24:18 PM By: Worthy Keeler PA-C Entered By: Worthy Keeler on 06/09/2020 15:24:17 [name] ([id number]) -------------------------------------------------------------------------------- Progress Note Details Patient Name: [name] Date of Service: 06/09/2020 3:15 PM Medical Record Number: [id number] Patient Account Number: [id number] Date of Birth/Sex: [birth year] (54 y.o.  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 He does have a couple callus areas that have ulcerations underneath on the left foot. 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 Fortunately there is no sign of active infection at this time which is good news he has been using Telfa pads after applying Betadine this is not really absorbing enough of the drainage however which is causing the area especially at the amputation site to be very macerated. The patient states that he actually has a work-up with Dr. Delana Meyer at [facility] next Thursday. For that reason we did not perform ABIs today and again I really am not doing any significant debridement today we will discontinue mild things to remove some of the necrotic debris so that we hopefully get these wounds moving in the proper direction. With that being said his extremities do appear to be warm without any signs obviously of significant arterial flow. The patient does have a history of diabetes mellitus type 2, congestive heart failure, end- stage renal disease, and dependence on renal dialysis. He notes that he did have an exacerbation of heart failure recently which caused his legs to swell he had a couple wounds on his left leg but fortunately these have actually improved and are healed as of today. 05/09/2020 on evaluation today patient appears to be  doing decently well all things considered in regard to his wounds. He does have the sutures out at  this point in regard to his right amputation site. That does seem to be showing signs of improvement which is good news from the standpoint of at least being able to get some of the slough out although he does have quite a bit of slough buildup on the surface of the wound here. I think that is can require debridement before were able to see dramatic filling in. He may even require wound VAC at the site. With regard to the lateral foot this seems to be loosening up with the Santyl and I am pleased in that regard. In regard to the left foot everything appears to be healed and is doing great. 05/18/2020 on evaluation today patient appears to be doing well at this time in regard to his wounds. 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 There is no signs of active infection at this time. 05/26/2020 on evaluation today patient appears to be actually showing signs of improvement in general in regard to his wounds. The plantar aspect wound does connect to the dorsal surface wound which I initially expected but after last week was hopeful that that would not be the case. I was in fact hoping it would completely sealed up this week but it does not seem to have happened. There is no signs of active infection at this time which is great news. With that being said he does still have tender noted on the lateral aspect of the foot where the wound is there but this does not seem to be nearly as slough covered in fact I was able to carefully clean this off today and other than the fact that we need to help seal up the tendon I do not see any signs of infection at this point. 06/02/2020 upon evaluation today patient actually is making excellent improvement in regard to all of his wounds. In  fact the plantar aspect of his foot looks like it may have sealed up although I cannot be 742% certain in this regard. With that being said I do see where there was some drainage on his dressing but today I cannot find any real depth to the wound opening and it appears to be close to closed if not completely. Nonetheless I do think that the dorsal surface of the foot is doing excellent in fact we have gotten approval for snap VAC but I am not even sure that skin to be necessary he is healing so well already. In regard to the lateral foot I do feel like the collagen has been beneficial here and my recommendation is good to be that we likely continue as such with this. Tenderness still exposed but seems to be nice and moist I see no issues in that regard. 06/09/2020 on evaluation today patient appears to be doing pretty well in regard to his foot ulcers although the plantar ulcer does appear to be deeper than what I was able to measure last week that is unfortunate. The lateral foot seems to be doing okay. Fortunately there is no signs of active infection at this time. How are in regard to the blister on the lower leg on the right that  opened last time this appears to be erythematous around and I feel like it is also warm to touch I am concerned about infection here. Objective Constitutional Well-nourished and well-hydrated in no acute distress. Vitals Time Taken: 3:24 PM, Height: 72 in, Weight: 225 lbs, BMI: 30.5, Temperature: 98.8 F, Pulse: 70 bpm, Respiratory Rate: 16 breaths/min, Blood Pressure: 128/78 mmHg. CEDRICK, PARTAIN S. ([id number]) Respiratory normal breathing without difficulty. Psychiatric this patient is able to make decisions and demonstrates good insight into disease process. Alert and Oriented x 3. pleasant and cooperative. General Notes: Patient's wounds currently do not require any sharp debridement although I did obtain a wound culture due to the erythema and redness  around the right leg anteriorly. I did mark around this with a permanent marker as well and if it spreads outside of the marked line as far as the erythema is concerned patient was advised to go to the ER ASAP. Integumentary (Hair, Skin) Wound #1 status is Open. Original cause of wound was Pressure Injury. The wound is located on the Right,Lateral Foot. The wound measures 2cm length x 1.6cm width x 0.2cm depth; 2.513cm^2 area and 0.503cm^3 volume. There is joint, tendon, and Fat Layer (Subcutaneous Tissue) exposed. There is no tunneling or undermining noted. There is a medium amount of serosanguineous drainage noted. The wound margin is distinct with the outline attached to the wound base. There is large (67-100%) red granulation within the wound bed. There is a small (1-33%) amount of necrotic tissue within the wound bed including Adherent Slough. Wound #2 status is Open. Original cause of wound was Surgical Injury. The wound is located on the Right,Dorsal Foot. The wound measures 3cm length x 0.7cm width x 0.4cm depth; 1.649cm^2 area and 0.66cm^3 volume. There is Fat Layer (Subcutaneous Tissue) exposed. There is no tunneling or undermining noted. There is a medium amount of serosanguineous drainage noted. The wound margin is distinct with the outline attached to the wound base. There is large (67-100%) red granulation within the wound bed. There is a small (1-33%) amount of necrotic tissue within the wound bed including Adherent Slough. Wound #5 status is Open. Original cause of wound was Surgical Injury. The wound is located on the [place]. The wound measures 0.1cm length x 0.1cm width x 2.6cm depth; 0.008cm^2 area and 0.02cm^3 volume. There is no tunneling or undermining noted. There is a small amount of serous drainage noted. The wound margin is distinct with the outline attached to the wound base. There is no granulation within the wound bed. There is no necrotic tissue within the  wound bed. Wound #6 status is Open. Original cause of wound was Gradually Appeared. The wound is located on the Right,Proximal,Anterior Lower Leg. The wound measures 5.4cm length x 3cm width x 0.1cm depth; 12.723cm^2 area and 1.272cm^3 volume. There is Fat Layer (Subcutaneous Tissue) exposed. There is no tunneling or undermining noted. There is a medium amount of serosanguineous drainage noted. The wound margin is flat and intact. There is large (67-100%) red granulation within the wound bed. There is no necrotic tissue within the wound bed. 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 Wound #2 Right,Dorsal Foot: Clean wound with Normal Saline. Wound #5 Right,Plantar Foot: Clean wound with Normal Saline. Wound #6 Right,Proximal,Anterior Lower Leg: Clean wound with Normal Saline. Anesthetic (add to Medication List): Wound #1 Right,Lateral Foot: Topical Lidocaine 4% cream applied to wound bed prior to debridement (In Clinic Only). Wound #2 Right,Dorsal Foot: Topical Lidocaine 4% cream applied to wound bed prior to debridement (In Clinic Only). Wound #5 Right,Plantar Foot: Topical Lidocaine 4% cream applied to wound bed prior to debridement (In Clinic Only). Wound #6 Right,Proximal,Anterior Lower Leg: Topical Lidocaine 4% cream applied to wound bed prior to debridement (In Clinic Only). KEE, DRUDGE ([id number]) Primary Wound Dressing: Wound #1  Right,Lateral Foot: Silver Collagen Wound #2 Right,Dorsal Foot: Silver Alginate - pack small strip inside Wound #5 Right,Plantar Foot: Silver Alginate Wound #6 Right,Proximal,Anterior Lower Leg: Silver Alginate Secondary Dressing: Wound #1 Right,Lateral Foot: Boardered Foam Dressing Wound #2 Right,Dorsal Foot: ABD and Kerlix/Conform Wound #5 Right,Plantar Foot: ABD and Kerlix/Conform Dressing Change Frequency: Wound #1 Right,Lateral Foot: Change dressing every other day. Wound #2 Right,Dorsal Foot: Change dressing every other day. Wound #5 Right,Plantar Foot: Change dressing every other day. Follow-up Appointments: Wound #1 Right,Lateral Foot: Return Appointment in 1 week. Wound #2 Right,Dorsal Foot: Return Appointment in 1 week. Wound #5 Right,Plantar Foot: Return Appointment in 1 week. Edema Control: Wound #1 Right,Lateral Foot: Elevate legs to the level of the heart and pump ankles as often as possible - Tubigrip E Wound #2 Right,Dorsal Foot: Elevate legs to the level of the heart and pump ankles as often as possible - Tubigrip E Wound #5 Right,Plantar Foot: Elevate legs to the level of the heart and pump ankles as often as possible Other: - Tubigrip E Off-Loading: Wound #1 Right,Lateral Foot: Other: - front off-loader right Wound #2 Right,Dorsal Foot: Other: - front off-loader right Wound #5 Right,Plantar Foot: Other: - front off-loader right Additional Orders / Instructions: Wound #1 Right,Lateral Foot: Increase protein intake. Wound #2 Right,Dorsal Foot: Increase protein intake. Wound #5 Right,Plantar Foot: Increase protein intake. Laboratory ordered were: Wound culture routine The following medication(s) was prescribed: doxycycline hyclate oral 100 mg capsule 1 1 capsule oral taken 2 times per day for 14 days starting 06/09/2020 1. I would recommend currently that we go ahead and continue with the silver alginate to all wounds except for the lateral foot  where were using collagen. 2. I am also can recommend at this time that we have the patient continue with monitoring the right anterior lower extremity I am concerned here about infection and I did obtain a wound culture today we will see what that shows in the meantime I am going to send in a prescription for doxycycline for him as well. 3. We will continue with collagen for the lateral foot wound that seems to be doing quite well for him currently. We will see patient back for reevaluation in 1 week here in the clinic. If anything worsens or changes patient will contact our office for additional recommendations. If the patient develops any fevers, chills, nausea, vomiting, or diarrhea he is to go to the hospital ASAP for further evaluation and treatment. Electronic Signature(s) Signed: 06/09/2020 4:38:54 PM By: Worthy Keeler PA-C Entered By: Worthy Keeler on 06/09/2020 16:38:53 [name] ([name] ([id number]) -------------------------------------------------------------------------------- SuperBill Details Patient Name: [name] Date of Service: 06/09/2020 Medical Record Number: [id number] Patient Account Number: [id number] Date  of Birth/Sex: [birth year] (54 y.o. 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 mellitus with foot ulcer I50.42 Chronic combined systolic (congestive) and diastolic (congestive) heart  failure N18.6 End stage renal disease Z99.2 Dependence on renal dialysis Facility Procedures CPT4 Code: [id number] Description: 99213 - WOUND CARE VISIT-LEV 3 EST PT Modifier: Quantity: 1 Physician Procedures CPT4 Code: [id number] Description: 99213 - WC PHYS LEVEL 3 - EST PT Modifier: Quantity: 1 CPT4 Code: Description: ICD-10 Diagnosis Description L89.893 Pressure ulcer of other site, stage 3 T81.31XA Disruption of external operation (surgical) wound, not elsewhere classifi L97.512 Non-pressure chronic ulcer of other part of right foot with fat layer exp  L97.522 Non-pressure chronic ulcer of other part of left foot with fat layer expo Modifier: ed, initial encounter osed sed Quantity: Electronic Signature(s) Signed: 06/09/2020 4:45:19 PM By: Charlett Nose Signed: 06/12/2020 5:51:33 PM By: Worthy Keeler PA-C Previous Signature: 06/09/2020 4:39:15 PM Version By: Worthy Keeler PA-C Entered By: Georges Mouse, Minus Breeding on 06/09/2020 16:45:19

## 2020-06-12 LAB — AEROBIC CULTURE W GRAM STAIN (SUPERFICIAL SPECIMEN): Culture: NO GROWTH

## 2020-06-13 ENCOUNTER — Other Ambulatory Visit: Payer: Self-pay

## 2020-06-13 ENCOUNTER — Encounter: Payer: Medicare PPO | Admitting: Physician Assistant

## 2020-06-13 DIAGNOSIS — L89893 Pressure ulcer of other site, stage 3: Secondary | ICD-10-CM | POA: Diagnosis not present

## 2020-06-13 NOTE — Progress Notes (Signed)
RAMIZ, TURPIN ([id number]) Visit Report for 06/13/2020 Arrival Information Details Patient Name: Ralph Peterson, Ralph Peterson. Date of Service: 06/13/2020 2:30 PM Medical Record Number: [id number] Patient Account Number: [id number] Date of Birth/Sex: 1966/04/06 (54 y.o. M) Treating RN: Stann Mainland Primary Care Mersades Barbaro: Vidal Schwalbe Other Clinician: Referring Alexine Pilant: Vidal Schwalbe Treating Emaan Gary/Extender: Skipper Cliche in Treatment: 6 Visit Information History Since Last Visit Added or deleted any medications: No Patient Arrived: Wheel Chair Any new allergies or adverse reactions: No Arrival Time: 14:40 Had a fall or experienced change in No Accompanied By: family member activities of daily living that may affect Transfer Assistance: None risk of falls: Patient Identification Verified: Yes Signs or symptoms of abuse/neglect since last visito No Secondary Verification Process Completed: Yes Hospitalized since last visit: No Patient Requires Transmission-Based Precautions: No Implantable device outside of the clinic excluding No Patient Has Alerts: No cellular tissue based products placed in the center since last visit: Has Dressing in Place as Prescribed: Yes Has Footwear/Offloading in Place as Prescribed: Yes Right: Wedge Shoe Pain Present Now: Yes Electronic Signature(s) Signed: 06/13/2020 3:53:56 PM By: Deon Pilling Entered By: Deon Pilling on 06/13/2020 14:55:58 Ralph Peterson ([id number]) -------------------------------------------------------------------------------- Encounter Discharge Information Details Patient Name: Ralph Peterson. Date of Service: 06/13/2020 2:30 PM Medical Record Number: [id number] Patient Account Number: [id number] Date of Birth/Sex: [birth year] (54 y.o. M) Treating RN: Carlene Coria Primary Care Shatori Bertucci: Vidal Schwalbe Other Clinician: Referring Magdalina Whitehead: Vidal Schwalbe Treating Brinton Brandel/Extender: Skipper Cliche in Treatment: 6 Encounter Discharge Information Items Discharge Condition: Stable Ambulatory Status: Wheelchair Discharge Destination: Home Transportation: Private Auto Accompanied By: self Schedule Follow-up Appointment: Yes Clinical Summary of Care: Patient Declined Electronic Signature(s) Signed: 06/13/2020 5:17:28 PM By: Carlene Coria RN Entered By: Carlene Coria on 06/13/2020 16:09:51 Ralph Peterson ([id number]) -------------------------------------------------------------------------------- Lower Extremity Assessment Details Patient Name: Ralph Peterson. Date of Service: 06/13/2020 2:30 PM Medical Record Number: [id number] Patient Account Number: [id number] Date of Birth/Sex: [birth year] (54 y.o. M) Treating RN: Stann Mainland Primary Care Hoover Grewe: Vidal Schwalbe Other Clinician: Referring Carvin Almas: Vidal Schwalbe Treating Akeila Lana/Extender: Skipper Cliche in Treatment: 6 Edema Assessment Assessed: [Left: No] [Right: Yes] Edema: [Left: Ye] [Right: s] Calf Left: Right: Point of Measurement: From Medial Instep 33.5 cm Ankle Left: Right: Point of Measurement: From Medial Instep 21 cm Vascular Assessment Pulses: Dorsalis Pedis Palpable: [Right:Yes] Electronic Signature(s) Signed: 06/13/2020 3:53:56 PM By: Deon Pilling Entered By: Deon Pilling on 06/13/2020 14:59:04 Ralph Peterson ([id number]) -------------------------------------------------------------------------------- Multi Wound Chart Details Patient Name: Ralph Peterson. Date of Service: 06/13/2020 2:30 PM Medical Record Number: [id number] Patient Account Number: [id number] Date of Birth/Sex: 06/17/1966 (54 y.o. 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 Comorbid History: Arrhythmia, Congestive Heart Arrhythmia, Congestive Heart Arrhythmia, Congestive Heart Failure, Coronary Artery Disease, Failure, Coronary Artery Disease, Failure, Coronary Artery Disease, Hypertension, Myocardial Infarction, Hypertension, Myocardial Infarction, Hypertension, Myocardial Infarction, Peripheral Venous Disease, Type II Peripheral Venous Disease, Type II Peripheral Venous Disease, Type II Diabetes, Neuropathy Diabetes, Neuropathy Diabetes, Neuropathy Date Acquired: 02/20/2020 04/20/2020 03/24/2020 Weeks of Treatment: 6 6 3  Wound Status: Open Open Open Measurements L x W x D (cm) 2x1.9x0.2 3.3x0.5x0.2 0.1x0.1x2.4 Area (cm) : 2.985 1.296 0.008 Volume (cm) : 0.597 0.259 0.019 % Reduction in Area: 41.00% 72.50% 98.80% % Reduction in Volume: 76.40% 81.70% 94.30% Classification: Category/Stage IV Full Thickness Without Exposed Grade 1 Support Structures Exudate Amount: Medium Medium Medium Exudate Type: Serosanguineous Serosanguineous Serous Exudate Color: red, brown red, brown amber Wound Margin: Distinct, outline attached Distinct, outline attached Distinct, outline attached Granulation Amount: Large (67-100%) Medium (34-66%) None Present (0%) Granulation Quality: Red Red N/A Necrotic Amount: None Present (0%) Medium (34-66%) Large (67-100%) Exposed Structures: Fat Layer (Subcutaneous Tissue): Fat Layer (Subcutaneous Tissue): Fascia: No Yes Yes Fat Layer (Subcutaneous Tissue): Tendon: Yes Fascia: No No Joint: Yes Tendon: No Tendon: No Fascia: No Muscle: No Muscle: No Muscle:  No Joint: No Joint: No Bone: No Bone: No Bone: No Epithelialization: None Small (1-33%) None Assessment Notes: N/A N/A N/A Wound Number: 6 N/A N/A Photos: N/A N/A JAMORI, BIGGAR ([id number]) Wound Location: Right, Proximal, Anterior Lower Leg N/A N/A Wounding Event: Gradually Appeared N/A N/A Primary Etiology: Diabetic Wound/Ulcer of the Lower N/A N/A Extremity Comorbid History: Arrhythmia, Congestive Heart N/A N/A Failure, Coronary Artery Disease, Hypertension, Myocardial Infarction, Peripheral Venous Disease, Type II Diabetes, Neuropathy Date Acquired: 06/02/2020 N/A N/A Weeks of Treatment: 0 N/A N/A Wound Status: Open N/A N/A Measurements L x W x D (cm) 5x2x0.1 N/A N/A Area (cm) : 7.854 N/A N/A Volume (cm) : 0.785 N/A N/A % Reduction in Area: 38.30% N/A N/A % Reduction in Volume: 38.30% N/A N/A Classification: Grade 1 N/A N/A Exudate Amount: Medium N/A N/A Exudate Type: Serosanguineous N/A N/A Exudate Color: red, brown N/A N/A Wound Margin: Flat and Intact N/A N/A Granulation Amount: Large (67-100%) N/A N/A Granulation Quality: Red N/A N/A Necrotic Amount: None Present (0%) N/A N/A Exposed Structures: Fat Layer (Subcutaneous Tissue): N/A N/A Yes Fascia: No Tendon: No Muscle: No Joint: No Bone: No Epithelialization: Medium (34-66%) N/A N/A Assessment Notes: x3 islands of epithelium. N/A N/A Treatment Notes Electronic Signature(s) Signed: 06/13/2020 5:19:05 PM By: Gretta Cool, BSN, RN, CWS, Kim RN, BSN Entered By: Gretta Cool, BSN, RN, CWS, Kim on 06/13/2020 15:19:00 Ralph Peterson ([id number]) -------------------------------------------------------------------------------- Multi-Disciplinary Care Plan Details Patient Name: HARTFORD, MAULDEN. Date of Service: 06/13/2020 2:30 PM Medical Record Number: [id number] Patient Account Number: [id number] Date of Birth/Sex: 1965/12/21 (54 y.o. M) Treating RN: Cornell Barman Primary Care Win Guajardo: Vidal Schwalbe  Other Clinician: Referring Sharrie Self: Vidal Schwalbe Treating Shardea Cwynar/Extender: Skipper Cliche in Treatment: 6 Active Inactive Necrotic Tissue Nursing Diagnoses: Impaired tissue integrity related to necrotic/devitalized tissue Knowledge deficit related to management of necrotic/devitalized tissue Goals: Necrotic/devitalized tissue will be minimized in the wound bed Date Initiated: 05/09/2020 Target Resolution Date: 05/09/2020 Goal Status: Active Interventions: Assess patient pain level pre-, during and post procedure and prior to discharge Provide education on necrotic tissue and debridement process Treatment Activities: Enzymatic debridement : 05/09/2020 Notes: Orientation to the Wound Care Program Nursing Diagnoses: Knowledge deficit related to the wound healing center program Goals: Patient/caregiver will verbalize understanding of the [place] Date Initiated: 04/27/2020 Target Resolution Date: 05/18/2020 Goal Status: Active Interventions: Provide education on orientation to the wound center Notes: Wound/Skin Impairment Nursing Diagnoses: Impaired tissue integrity Knowledge deficit related to ulceration/compromised skin integrity Goals: Patient/caregiver will verbalize understanding of skin care regimen Date Initiated: 04/27/2020 Target Resolution Date: 05/25/2020 Goal Status: Active Interventions: Assess patient/caregiver ability to obtain necessary supplies Assess patient/caregiver ability to perform ulcer/skin care  regimen upon admission and as needed Assess ulceration(s) every visit Provide education on ulcer and skin care Treatment Activities: Skin care regimen initiated : 04/27/2020 LANDY, DUNNAVANT ([id number]) Topical wound management initiated : 04/27/2020 Notes: Electronic Signature(s) Signed: 06/13/2020 5:19:05 PM By: Gretta Cool, BSN, RN, CWS, Kim RN, BSN Entered By: Gretta Cool, BSN, RN, CWS, Kim on 06/13/2020 15:18:47 Ralph Peterson ([id number]) -------------------------------------------------------------------------------- Pain Assessment Details Patient Name: Ralph Peterson. Date of Service: 06/13/2020 2:30 PM Medical Record Number: [id number] Patient Account Number: [id number] Date of Birth/Sex: 1965/11/01 (54 y.o. M) Treating RN: Stann Mainland Primary Care Marquan Vokes: Vidal Schwalbe Other Clinician: Referring Jordell Outten: Vidal Schwalbe Treating Roschelle Calandra/Extender: Skipper Cliche in Treatment: 6 Active Problems Location of Pain Severity and Description of Pain Patient Has Paino Yes Site Locations Pain Location: Pain in Ulcers Rate the pain. Current Pain Level: 1 Worst Pain Level: 10 Least Pain Level: 0 Tolerable Pain Level: 8 Pain Management and Medication Current Pain Management: Medication: No Cold Application: No Rest: No Massage: No Activity: No T.E.N.S.: No Heat Application: No Leg drop or elevation: No Is the Current Pain Management Adequate: Adequate How does your wound impact your activities of daily livingo Sleep: No Bathing: No Appetite: No Relationship With Others: No Bladder Continence: No Emotions: No Bowel Continence: No Work: No Toileting: No Drive: No Dressing: No Hobbies: No Electronic Signature(s) Signed: 06/13/2020 3:53:56 PM By: Deon Pilling Entered By: Deon Pilling on 06/13/2020 14:56:34 Ralph Peterson ([id number]) -------------------------------------------------------------------------------- Patient/Caregiver Education Details Patient Name: Ralph Peterson Date of Service: 06/13/2020 2:30 PM Medical Record Number: [id number] Patient Account Number: [id number] Date of Birth/Gender: [birth year] (54 y.o. M) Treating RN: Cornell Barman Primary Care Physician: Vidal Schwalbe Other Clinician: Referring Physician: Vidal Schwalbe Treating Physician/Extender: Skipper Cliche in Treatment: 6 Education Assessment Education Provided  To: Patient Education Topics Provided Wound Debridement: Handouts: Wound Debridement Methods: Demonstration, Explain/Verbal Responses: State content correctly Wound/Skin Impairment: Handouts: Caring for Your Ulcer Methods: Demonstration, Explain/Verbal Responses: State content correctly Electronic Signature(s) Signed: 06/13/2020 5:19:05 PM By: Gretta Cool, BSN, RN, CWS, Kim RN, BSN Entered By: Gretta Cool, BSN, RN, CWS, Kim on 06/13/2020 15:29:54 Ralph Peterson ([id number]) -------------------------------------------------------------------------------- Wound Assessment Details Patient Name: Ralph Peterson. Date of Service: 06/13/2020 2:30 PM Medical Record Number: [id number] Patient Account Number: [id number] Date of Birth/Sex: 10/15/1965 (54 y.o. M) Treating RN: Stann Mainland Primary Care Payne Garske: Vidal Schwalbe Other Clinician: Referring Tyreshia Ingman: Vidal Schwalbe Treating Makih Stefanko/Extender: Skipper Cliche in Treatment: 6 Wound Status Wound Number: 1 Primary Pressure Ulcer Etiology: Wound Location: Right, Lateral Foot Wound Open Wounding Event: Pressure Injury Status: Date Acquired: 02/20/2020 Comorbid Arrhythmia, Congestive Heart Failure, Coronary Artery Weeks Of Treatment: 6 History: Disease, Hypertension, Myocardial Infarction, Peripheral Clustered Wound: No Venous Disease, Type II Diabetes, Neuropathy Photos Wound Measurements Length: (cm) 2 % Red Width: (cm) 1.9 % Red Depth: (cm) 0.2 Epith Area: (cm) 2.985 Tunn Volume: (cm) 0.597 Unde uction in Area: 41% uction in Volume: 76.4% elialization: None eling: No rmining: No Wound Description Classification: Category/Stage IV Foul Wound Margin: Distinct, outline attached Sloug Exudate Amount: Medium Exudate Type: Serosanguineous Exudate Color: red, brown Odor After Cleansing: No h/Fibrino No Wound Bed Granulation Amount: Large (67-100%) Exposed Structure Granulation Quality: Red Fascia Exposed:  No Necrotic Amount: None Present (0%) Fat Layer (Subcutaneous Tissue) Exposed: Yes Tendon Exposed: Yes Muscle Exposed: No Joint Exposed: Yes Bone Exposed: No Treatment Notes Wound #1 (Right, Lateral Foot) 1. Cleansed with: Clean wound with Normal Saline 3. Peri-wound Care: Skin Prep JAMESYN, LINDELL. ([id number])  Notes prisma, contact layer, BFD Electronic Signature(s) Signed: 06/13/2020 3:53:56 PM By: Deon Pilling Entered By: Deon Pilling on 06/13/2020 15:02:53 Ralph Peterson ([id number]) -------------------------------------------------------------------------------- Wound Assessment Details Patient Name: Ralph Peterson. Date of Service: 06/13/2020 2:30 PM Medical Record Number: [id number] Patient Account Number: [id number] Date of Birth/Sex: 1965-08-19 (54 y.o. M) Treating RN: Stann Mainland Primary Care Shenia Alan: Vidal Schwalbe Other Clinician: Referring Armany Mano: Vidal Schwalbe Treating Trenda Corliss/Extender: Skipper Cliche in Treatment: 6 Wound Status Wound Number: 2 Primary Open Surgical Wound Etiology: Wound Location: Right, Dorsal Foot Wound Open Wounding Event: Surgical Injury Status: Date Acquired: 04/20/2020 Comorbid Arrhythmia, Congestive Heart Failure, Coronary Artery Weeks Of Treatment: 6 History: Disease, Hypertension, Myocardial Infarction, Peripheral Clustered Wound: No Venous Disease, Type II Diabetes, Neuropathy Photos Wound Measurements Length: (cm) 3.3 Width: (cm) 0.5 Depth: (cm) 0.2 Area: (cm) 1.296 Volume: (cm) 0.259 % Reduction in Area: 72.5% % Reduction in Volume: 81.7% Epithelialization: Small (1-33%) Tunneling: No Undermining: No Wound Description Classification: Full Thickness Without Exposed Support Structu Wound Margin: Distinct, outline attached Exudate Amount: Medium Exudate Type: Serosanguineous Exudate Color: red, brown res Foul Odor After Cleansing: No Slough/Fibrino Yes Wound Bed Granulation  Amount: Medium (34-66%) Exposed Structure Granulation Quality: Red Fascia Exposed: No Necrotic Amount: Medium (34-66%) Fat Layer (Subcutaneous Tissue) Exposed: Yes Necrotic Quality: Adherent Slough Tendon Exposed: No Muscle Exposed: No Joint Exposed: No Bone Exposed: No Treatment Notes Wound #2 (Right, Dorsal Foot) 1. Cleansed with: Clean wound with Normal Saline 3. Peri-wound Care: Skin Prep KERMAN, PFOST. ([id number]) 4. Dressing Applied: Prisma Ag 5. Secondary Dressing Applied Dry Gauze 8. Negative Pressure Wound Therapy Other (specify in notes) Notes snap vac 125 mmhg Electronic Signature(s) Signed: 06/13/2020 3:53:56 PM By: Deon Pilling Entered By: Deon Pilling on 06/13/2020 15:03:18 Ralph Peterson ([id number]) -------------------------------------------------------------------------------- Wound Assessment Details Patient Name: Ralph Peterson. Date of Service: 06/13/2020 2:30 PM Medical Record Number: [id number] Patient Account Number: [id number] Date of Birth/Sex: 13-Oct-1965 (54 y.o. M) Treating RN: Stann Mainland Primary Care Chardai Gangemi: Vidal Schwalbe Other Clinician: Referring Tayven Renteria: Vidal Schwalbe Treating Towana Stenglein/Extender: Skipper Cliche in Treatment: 6 Wound Status Wound Number: 5 Primary Diabetic Wound/Ulcer of the Lower Extremity Etiology: Wound Location: Right, Plantar Foot Wound Open Wounding Event: Surgical Injury Status: Date Acquired: 03/24/2020 Comorbid Arrhythmia, Congestive Heart Failure, Coronary Artery Weeks Of Treatment: 3 History: Disease, Hypertension, Myocardial Infarction, Peripheral Clustered Wound: No Venous Disease, Type II Diabetes, Neuropathy Photos Wound Measurements Length: (cm) 0.1 % Redu Width: (cm) 0.1 % Redu Depth: (cm) 2.4 Epithe Area: (cm) 0.008 Tunne Volume: (cm) 0.019 Under ction in Area: 98.8% ction in Volume: 94.3% lialization: None ling: No mining: No Wound  Description Classification: Grade 1 Foul O Wound Margin: Distinct, outline attached Slough Exudate Amount: Medium Exudate Type: Serous Exudate Color: amber dor After Cleansing: No /Fibrino Yes Wound Bed Granulation Amount: None Present (0%) Exposed Structure Necrotic Amount: Large (67-100%) Fascia Exposed: No Necrotic Quality: Adherent Slough Fat Layer (Subcutaneous Tissue) Exposed: No Tendon Exposed: No Muscle Exposed: No Joint Exposed: No Bone Exposed: No Treatment Notes Wound #5 (Right, Plantar Foot) 1. Cleansed with: Clean wound with Normal Saline 3. Peri-wound Care: Skin Prep HAMZAH, SAVOCA ([id number]) Notes scell, duoderm Electronic Signature(s) Signed: 06/13/2020 3:53:56 PM By: Deon Pilling Entered By: Deon Pilling on 06/13/2020 15:03:44 Ralph Peterson ([id number]) -------------------------------------------------------------------------------- Wound Assessment Details Patient Name: Ralph Peterson. Date of Service: 06/13/2020 2:30 PM Medical Record Number: [id number] Patient Account Number: [id number] Date of Birth/Sex: July 28, 1965 (54 y.o. M) Treating RN: Stann Mainland  Primary Care Gary Gabrielsen: Vidal Schwalbe Other Clinician: Referring Bowen Goyal: Vidal Schwalbe Treating Marilena Trevathan/Extender: Skipper Cliche in Treatment: 6 Wound Status Wound Number: 6 Primary Diabetic Wound/Ulcer of the Lower Extremity Etiology: Wound Location: Right, Proximal, Anterior Lower Leg Wound Open Wounding Event: Gradually Appeared Status: Date Acquired: 06/02/2020 Comorbid Arrhythmia, Congestive Heart Failure, Coronary Artery Weeks Of Treatment: 0 History: Disease, Hypertension, Myocardial Infarction, Peripheral Clustered Wound: No Venous Disease, Type II Diabetes, Neuropathy Photos Wound Measurements Length: (cm) 5 Width: (cm) 2 Depth: (cm) 0.1 Area: (cm) 7.854 Volume: (cm) 0.785 % Reduction in Area: 38.3% % Reduction in Volume:  38.3% Epithelialization: Medium (34-66%) Tunneling: No Undermining: No Wound Description Classification: Grade 1 Wound Margin: Flat and Intact Exudate Amount: Medium Exudate Type: Serosanguineous Exudate Color: red, brown Foul Odor After Cleansing: No Slough/Fibrino No Wound Bed Granulation Amount: Large (67-100%) Exposed Structure Granulation Quality: Red Fascia Exposed: No Necrotic Amount: None Present (0%) Fat Layer (Subcutaneous Tissue) Exposed: Yes Tendon Exposed: No Muscle Exposed: No Joint Exposed: No Bone Exposed: No Assessment Notes x3 islands of epithelium. Treatment Notes Wound #6 (Right, Proximal, Anterior Lower Leg) 1. Cleansed with: Clean wound with Normal Saline Potenza, Gennie S. ([id number]) 3. Peri-wound Care: Skin Prep 4. Dressing Applied: Prisma Ag 5. Secondary Dressing Applied Dry Gauze Kerlix/Conform 7. Secured with Recruitment consultant) Signed: 06/13/2020 3:53:56 PM By: Deon Pilling Entered By: Deon Pilling on 06/13/2020 15:04:11 Ralph Peterson ([id number]) -------------------------------------------------------------------------------- Vitals Details Patient Name: Ralph Peterson Date of Service: 06/13/2020 2:30 PM Medical Record Number: [id number] Patient Account Number: [id number] Date of Birth/Sex: 1966-05-28 (54 y.o. M) Treating RN: Stann Mainland Primary Care Leory Allinson: Vidal Schwalbe Other Clinician: Referring Tayen Narang: Vidal Schwalbe Treating Manya Balash/Extender: Skipper Cliche in Treatment: 6 Vital Signs Time Taken: 14:40 Temperature (F): 98.4 Height (in): 72 Pulse (bpm): 70 Weight (lbs): 225 Respiratory Rate (breaths/min): 20 Body Mass Index (BMI): 30.5 Blood Pressure (mmHg): 138/88 Capillary Blood Glucose (mg/dl): 121 Reference Range: 80 - 120 mg / dl Electronic Signature(s) Signed: 06/13/2020 3:53:56 PM By: Deon Pilling Entered By: Deon Pilling on 06/13/2020 14:56:19

## 2020-06-13 NOTE — Progress Notes (Addendum)
MUHAMAD, SERANO ([id number]) Visit Report for 06/13/2020 Chief Complaint Document Details Patient Name: Ralph Peterson, Ralph Peterson. Date of Service: 06/13/2020 2:30 PM Medical Record Number: [id number] Patient Account Number: [id number] Date of Birth/Sex: [birth year] (54 y.o. 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 M) Treating RN: Cornell Barman Primary Care Provider: Vidal Schwalbe Other Clinician: Referring Provider: Vidal Schwalbe Treating Provider/Extender: Skipper Cliche in Treatment: 6 History of Present Illness HPI Description: He has been having with multiple ulcers of his feet. He does have a couple callus areas that have ulcerations underneath on the left foot. 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 Fortunately there is no sign of active infection at this time which is good news he has been using Telfa pads after applying Betadine this is not really absorbing enough of the drainage however which is causing the area especially at the amputation site to be very macerated. The patient states that he actually has a work-up with Dr. Delana Meyer at [facility] next Thursday. For that reason we did not perform ABIs today and again I really am not doing any significant debridement today we will discontinue mild things to remove some of the necrotic debris so that we hopefully get these wounds moving in the proper direction. With that being said his extremities do appear to be warm without any signs obviously of significant arterial flow. The patient does have a history of diabetes mellitus type 2, congestive heart failure, end- stage renal disease, and dependence on renal dialysis. He notes that he did have an exacerbation of heart failure recently which caused his legs to swell he had a couple wounds on his left leg but fortunately these have actually improved and are healed as of today. 05/09/2020 on evaluation today patient appears to be doing decently well all things considered in regard to his wounds. He does have the sutures out at this point in regard to his right amputation site. That does seem to be showing signs of improvement which is good news from the standpoint of at least being able to get some of the slough out although he does have quite a bit of slough buildup on the surface of the wound here. I think that is can require debridement before were able to see dramatic filling in. He may even require wound VAC at the site. With regard to the lateral foot this seems to be loosening up with the Santyl and I am pleased in that regard. In regard to the left foot everything appears to be healed and is doing great. 05/18/2020 on evaluation today patient appears to be doing well at this time in regard to his wounds. The wound on the lateral portion of his foot and the dorsal surface of his foot has been require sharp debridement of plantar aspect which was separated out from the  incision site from the ray amputation actually appears to be almost completely healed and is doing excellent. There is no signs of active infection at this  time. 05/26/2020 on evaluation today patient appears to be actually showing signs of improvement in general in regard to his wounds. The plantar aspect wound does connect to the dorsal surface wound which I initially expected but after last week was hopeful that that would not be the case. I was in fact hoping it would completely sealed up this week but it does not seem to have happened. There is no signs of active infection at this time which is great news. With that being said he does still have tender noted on the lateral aspect of the foot where the wound is there but this does not seem to be nearly as slough covered in fact I was able to carefully clean this off today and other than the fact that we need to help seal up the tendon I do not see any signs of infection at this point. 06/02/2020 upon evaluation today patient actually is making excellent improvement in regard to all of his wounds. In fact the plantar aspect of his foot looks like it may have sealed up although I cannot be 580% certain in this regard. With that being said I do see where there was some drainage on his dressing but today I cannot find any real depth to the wound opening and it appears to be close to closed if not completely. Nonetheless I do think that the dorsal surface of the foot is doing excellent in fact we have gotten approval for snap VAC but I am not even sure that skin to be necessary he is healing so well already. In regard to the lateral foot I do feel like the collagen has been beneficial here and my recommendation is good to be that we likely continue as such with this. Tenderness still exposed but seems to be nice and moist I see no issues in that regard. 06/09/2020 on evaluation today patient appears to be doing pretty well in regard to his foot ulcers although the plantar ulcer does appear to be deeper than what I was able to measure last week that is unfortunate. The lateral foot seems to be doing okay.  Fortunately there is no signs of active infection at this time. How are in regard to the blister on the lower leg on the right that opened last time this appears to be erythematous around and I feel like it is also warm to touch I am concerned about infection here. 06/13/2020 on evaluation today patient appears to be doing excellent in regard to his lateral foot wound is not measuring a lot smaller but it does look like it is fairly healthy at this time. His culture did show no signs of growth at this point which is good news. With that being said he has been taking the antibiotic and does feel like that has helped his leg is feeling better. This is good news. There are signs of new epithelial growth as well. With regard to his plantar foot this still shows signs of some depth its really slowly closing up and again I am not to concerned but and I cannot find where it directly connects to the area above but there may be some small connections. Either way I think that potentially the goal would be to try the snap VAC today to see if this could be beneficial I think it  may help to granulate in the upper wound and if we do that may be the plantar wound will show signs of improvement a little bit more sufficiently. Electronic Signature(s) Signed: 06/13/2020 3:29:01 PM By: Worthy Keeler PA-C Entered By: Worthy Keeler on 06/13/2020 15:29:01 Ralph Peterson ([id number]) -------------------------------------------------------------------------------- Physical Exam Details Patient Name: Ralph Peterson Date of Service: 06/13/2020 2:30 PM Medical Record Number: [id number] Patient Account Number: [id number] Date of Birth/Sex: August 12, 1965 (54 y.o. M) Treating RN: Cornell Barman Primary Care Provider: Vidal Schwalbe Other Clinician: Referring Provider: Vidal Schwalbe Treating Provider/Extender: Skipper Cliche in Treatment: 6 Constitutional Well-nourished and well-hydrated in no acute  distress. Respiratory normal breathing without difficulty. Psychiatric this patient is able to make decisions and demonstrates good insight into disease process. Alert and Oriented x 3. pleasant and cooperative. Notes Upon inspection patient's wound bed actually showed signs of good granulation at this time on the surface of the dorsal foot wound. The plantar foot wound is mainly just a small opening the back end of a sterile Q-tip fits into the region but not much more. The lateral foot wound is doing well although measuring slowly and tendon exposure no sharp debridement was necessary today. With regard to the leg ulcer is a new skin growth and I am very pleased in this regard. Electronic Signature(s) Signed: 06/13/2020 3:29:34 PM By: Worthy Keeler PA-C Entered By: Worthy Keeler on 06/13/2020 15:29:34 Ralph Peterson ([id number]) -------------------------------------------------------------------------------- Physician Orders Details Patient Name: Ralph Peterson Date of Service: 06/13/2020 2:30 PM Medical Record Number: [id number] Patient Account Number: [id number] Date of Birth/Sex: 09/30/1965 (54 y.o. M) Treating RN: Cornell Barman Primary Care Provider: Vidal Schwalbe Other Clinician: Referring Provider: Vidal Schwalbe Treating Provider/Extender: Skipper Cliche in Treatment: 6 Verbal / Phone Orders: No Diagnosis Coding ICD-10 Coding Code Description [phone] Pressure ulcer of other site, stage 3 T81.31XA Disruption of external operation (surgical) wound, not elsewhere classified, initial encounter 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d L97.522 Non-pressure chronic ulcer of other part of left foot with fat layer exposed E11.621 Type 2 diabetes mellitus with foot ulcer I50.42 Chronic combined systolic (congestive) and diastolic (congestive) heart failure N18.6 End stage renal disease Z99.2 Dependence on renal  dialysis Wound Cleansing Wound #1 Right,Lateral Foot o Clean wound with Normal Saline. Wound #2 Right,Dorsal Foot o Clean wound with Normal Saline. Wound #5 Right,Plantar Foot o Clean wound with Normal Saline. Wound #6 Right,Proximal,Anterior Lower Leg o Clean wound with Normal Saline. Anesthetic (add to Medication List) Wound #1 Right,Lateral Foot o Topical Lidocaine 4% cream applied to wound bed prior to debridement (In Clinic Only). Wound #2 Right,Dorsal Foot o Topical Lidocaine 4% cream applied to wound bed prior to debridement (In Clinic Only). Wound #5 Right,Plantar Foot o Topical Lidocaine 4% cream applied to wound bed prior to debridement (In Clinic Only). Wound #6 Right,Proximal,Anterior Lower Leg o Topical Lidocaine 4% cream applied to wound bed prior to debridement (In Clinic Only). Primary Wound Dressing Wound #1 Right,Lateral Foot o Silver Collagen Wound #5 Right,Plantar Foot o Silver Alginate - pack small strip inside Wound #6 Right,Proximal,Anterior Lower Leg o Silver Collagen Secondary Dressing Wound #1 Right,Lateral Foot o Boardered Foam Dressing Wound #6 Right,Proximal,Anterior Lower Leg o ABD and Kerlix/Conform Ralph Peterson, Ralph S. ([id number]) Dressing Change Frequency Wound #1 Right,Lateral Foot o Change dressing every other day. Wound #2 Right,Dorsal Foot o Once every week - as needed if canister loses suction or fills to  top Wound #5 Right,Plantar Foot o Once every week - as needed if canister loses suction or fills to top o Once every week Wound #6 Right,Proximal,Anterior Lower Leg o Change dressing every other day. Follow-up Appointments Wound #1 Right,Lateral Foot o Return Appointment in 1 week. Wound #2 Right,Dorsal Foot o Return Appointment in 1 week. Wound #5 Right,Plantar Foot o Return Appointment in 1 week. Wound #6 Right,Proximal,Anterior Lower Leg o Return Appointment in 1 week. Edema  Control Wound #1 Right,Lateral Foot o Elevate legs to the level of the heart and pump ankles as often as possible - Tubigrip E Wound #2 Right,Dorsal Foot o Elevate legs to the level of the heart and pump ankles as often as possible - Tubigrip E Wound #5 Right,Plantar Foot o Elevate legs to the level of the heart and pump ankles as often as possible - Tubigrip E Wound #6 Right,Proximal,Anterior Lower Leg o Elevate legs to the level of the heart and pump ankles as often as possible - Tubigrip E Off-Loading Wound #1 Right,Lateral Foot o Other: - front off-loader right Wound #2 Right,Dorsal Foot o Other: - front off-loader right Wound #5 Right,Plantar Foot o Other: - front off-loader right Wound #6 Right,Proximal,Anterior Lower Leg o Other: - front off-loader right Additional Orders / Instructions Wound #1 Right,Lateral Foot o Increase protein intake. Wound #2 Right,Dorsal Foot o Increase protein intake. Wound #5 Right,Plantar Foot o Increase protein intake. Wound #6 Right,Proximal,Anterior Lower Leg o Increase protein intake. Negative Pressure Wound Therapy Wound #2 Right,Dorsal Foot o Snap Vac applied - If canister fills before next visit, remove vac dressing and apply a dry gauze dressing. Call the office for additional orders. Ralph Peterson, Ralph Peterson ([id number]) Wound #5 Right,Plantar Foot o Snap Vac applied - If canister fills before next visit, remove vac dressing and apply a dry gauze dressing. Call the office for additional orders. Electronic Signature(s) Signed: 06/13/2020 5:19:05 PM By: Gretta Cool, BSN, RN, CWS, Kim RN, BSN Signed: 06/13/2020 5:21:29 PM By: Worthy Keeler PA-C Entered By: Gretta Cool BSN, RN, CWS, Kim on 06/13/2020 15:33:30 Ralph Peterson ([id number]) -------------------------------------------------------------------------------- Problem List Details Patient Name: Ralph Peterson, Ralph Peterson. Date of Service: 06/13/2020 2:30  PM Medical Record Number: [id number] Patient Account Number: [id number] Date of Birth/Sex: September 26, 1965 (54 y.o. M) Treating RN: Cornell Barman Primary Care Provider: Vidal Schwalbe Other Clinician: Referring Provider: Vidal Schwalbe Treating Provider/Extender: Skipper Cliche in Treatment: 6 Active Problems ICD-10 Encounter Code Description Active Date MDM Diagnosis L89.893 Pressure ulcer of other site, stage 3 04/27/2020 No Yes T81.31XA Disruption of external operation (surgical) wound, not elsewhere 04/27/2020 No Yes classified, initial encounter L97.512 Non-pressure chronic ulcer of other part of right foot with fat layer 04/27/2020 No Yes exposed L97.522 Non-pressure chronic ulcer of other part of left foot with fat layer 04/27/2020 No Yes exposed E11.621 Type 2 diabetes mellitus with foot ulcer 04/27/2020 No Yes I50.42 Chronic combined systolic (congestive) and diastolic (congestive) heart 04/27/2020 No Yes failure N18.6 End stage renal disease 04/27/2020 No Yes Z99.2 Dependence on renal dialysis 04/27/2020 No Yes Inactive Problems Resolved Problems Electronic Signature(s) Signed: 06/13/2020 2:59:03 PM By: Worthy Keeler PA-C Entered By: Worthy Keeler on 06/13/2020 14:59:03 Ralph Peterson ([id number]) -------------------------------------------------------------------------------- Progress Note Details Patient Name: Ralph Peterson Date of Service: 06/13/2020 2:30 PM Medical Record Number: [id number] Patient Account Number: [id number] Date of Birth/Sex: [birth year] (54 y.o. 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 He does have a couple callus areas that have ulcerations underneath on the left foot. 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 Fortunately there is no sign of active infection at this time which is good news he has been using Telfa pads after applying Betadine this is not really absorbing enough of the drainage however which is causing the area especially at the amputation site to be very macerated. The patient states that he actually has a work-up with Dr. Delana Meyer at [facility] next Thursday. For that reason we did not perform ABIs today and again I really am not doing any significant debridement today we will discontinue mild things to remove some of the necrotic debris so that we hopefully get these wounds moving in the proper direction. With that being said his extremities do appear to be warm without any signs obviously of significant arterial flow. The patient does have a history of diabetes mellitus type 2, congestive heart failure, end- stage renal disease, and dependence on renal dialysis. He notes that he did have an exacerbation of heart failure recently which caused his legs to swell he had a couple wounds on his left leg but fortunately these have actually improved and are healed as of today. 05/09/2020 on evaluation today patient appears to be doing decently well all things considered in regard to his wounds. He does have the sutures out at this point in regard to his right amputation site. That does seem to be showing signs of improvement which is good news from the standpoint of at least being able to get some of the slough out although he does have quite a bit of slough buildup on the surface of the wound here. I think that is can require debridement before were able to see dramatic filling in. He may even require wound VAC at the site.  With regard to the lateral foot this seems to be loosening up with the Santyl and I am pleased in that regard. In regard to the left foot everything appears to be healed and is doing great. 05/18/2020 on evaluation today patient appears to be doing well at this time in regard to his wounds. 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 There is no signs of active infection at this time. 05/26/2020 on evaluation today patient appears to be actually showing signs of improvement in general in regard to his wounds. The plantar aspect wound does connect to the dorsal surface wound which I initially expected but after last week was hopeful that that would not be the case. I was in fact hoping it would completely sealed up this week but it does not seem to have happened. There is no signs of active infection at this time which is great news. With that being said he does still have tender noted on the lateral aspect of the foot where the wound is there but this  does not seem to be nearly as slough covered in fact I was able to carefully clean this off today and other than the fact that we need to help seal up the tendon I do not see any signs of infection at this point. 06/02/2020 upon evaluation today patient actually is making excellent improvement in regard to all of his wounds. In fact the plantar aspect of his foot looks like it may have sealed up although I cannot be 812% certain in this regard. With that being said I do see where there was some drainage on his dressing but today I cannot find any real depth to the wound opening and it appears to be close to closed if not completely. Nonetheless I do think that the dorsal surface of the foot is doing excellent in fact we have  gotten approval for snap VAC but I am not even sure that skin to be necessary he is healing so well already. In regard to the lateral foot I do feel like the collagen has been beneficial here and my recommendation is good to be that we likely continue as such with this. Tenderness still exposed but seems to be nice and moist I see no issues in that regard. 06/09/2020 on evaluation today patient appears to be doing pretty well in regard to his foot ulcers although the plantar ulcer does appear to be deeper than what I was able to measure last week that is unfortunate. The lateral foot seems to be doing okay. Fortunately there is no signs of active infection at this time. How are in regard to the blister on the lower leg on the right that opened last time this appears to be erythematous around and I feel like it is also warm to touch I am concerned about infection here. 06/13/2020 on evaluation today patient appears to be doing excellent in regard to his lateral foot wound is not measuring a lot smaller but it does look like it is fairly healthy at this time. His culture did show no signs of growth at this point which is good news. With that being said he has been taking the antibiotic and does feel like that has helped his leg is feeling better. This is good news. There are signs of new epithelial growth as well. With regard to his plantar foot this still shows signs of some depth its really slowly closing up and again I am not to concerned but and I cannot find where it directly connects to the area above but there may be some small connections. Either way I think that potentially the goal would be to try the snap VAC today to see if this could be beneficial I think it may help to granulate in the upper wound and if we do that may be the plantar wound will show signs of improvement a little bit more sufficiently. Ralph Peterson, Ralph Peterson ([id number]) Objective Constitutional Well-nourished and  well-hydrated in no acute distress. Vitals Time Taken: 2:40 PM, Height: 72 in, Weight: 225 lbs, BMI: 30.5, Temperature: 98.4 F, Pulse: 70 bpm, Respiratory Rate: 20 breaths/min, Blood Pressure: 138/88 mmHg, Capillary Blood Glucose: 121 mg/dl. Respiratory normal breathing without difficulty. Psychiatric this patient is able to make decisions and demonstrates good insight into disease process. Alert and Oriented x 3. pleasant and cooperative. General Notes: Upon inspection patient's wound bed actually showed signs of good granulation at this time on the surface of the dorsal foot wound. The plantar foot wound is mainly just a  small opening the back end of a sterile Q-tip fits into the region but not much more. The lateral foot wound is doing well although measuring slowly and tendon exposure no sharp debridement was necessary today. With regard to the leg ulcer is a new skin growth and I am very pleased in this regard. Integumentary (Hair, Skin) Wound #1 status is Open. Original cause of wound was Pressure Injury. The wound is located on the Right,Lateral Foot. The wound measures 2cm length x 1.9cm width x 0.2cm depth; 2.985cm^2 area and 0.597cm^3 volume. There is joint, tendon, and Fat Layer (Subcutaneous Tissue) exposed. There is no tunneling or undermining noted. There is a medium amount of serosanguineous drainage noted. The wound margin is distinct with the outline attached to the wound base. There is large (67-100%) red granulation within the wound bed. There is no necrotic tissue within the wound bed. Wound #2 status is Open. Original cause of wound was Surgical Injury. The wound is located on the Right,Dorsal Foot. The wound measures 3.3cm length x 0.5cm width x 0.2cm depth; 1.296cm^2 area and 0.259cm^3 volume. There is Fat Layer (Subcutaneous Tissue) exposed. There is no tunneling or undermining noted. There is a medium amount of serosanguineous drainage noted. The wound margin is distinct  with the outline attached to the wound base. There is medium (34-66%) red granulation within the wound bed. There is a medium (34-66%) amount of necrotic tissue within the wound bed including Adherent Slough. Wound #5 status is Open. Original cause of wound was Surgical Injury. The wound is located on the [place]. The wound measures 0.1cm length x 0.1cm width x 2.4cm depth; 0.008cm^2 area and 0.019cm^3 volume. There is no tunneling or undermining noted. There is a medium amount of serous drainage noted. The wound margin is distinct with the outline attached to the wound base. There is no granulation within the wound bed. There is a large (67-100%) amount of necrotic tissue within the wound bed including Adherent Slough. Wound #6 status is Open. Original cause of wound was Gradually Appeared. The wound is located on the Right,Proximal,Anterior Lower Leg. The wound measures 5cm length x 2cm width x 0.1cm depth; 7.854cm^2 area and 0.785cm^3 volume. There is Fat Layer (Subcutaneous Tissue) exposed. There is no tunneling or undermining noted. There is a medium amount of serosanguineous drainage noted. The wound margin is flat and intact. There is large (67-100%) red granulation within the wound bed. There is no necrotic tissue within the wound bed. General Notes: x3 islands of epithelium. 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 Wound #2 Right,Dorsal  Foot: Clean wound with Normal Saline. Ralph Peterson, Ralph Peterson ([id number]) Wound #5 Right,Plantar Foot: Clean wound with Normal Saline. Wound #6 Right,Proximal,Anterior Lower Leg: Clean wound with Normal Saline. Anesthetic (add to Medication List): Wound #1 Right,Lateral Foot: Topical Lidocaine 4% cream applied to wound bed prior to debridement (In Clinic Only). Wound #2 Right,Dorsal Foot: Topical Lidocaine 4% cream applied to wound bed prior to debridement (In Clinic Only). Wound #5 Right,Plantar Foot: Topical Lidocaine 4% cream applied to  wound bed prior to debridement (In Clinic Only). Wound #6 Right,Proximal,Anterior Lower Leg: Topical Lidocaine 4% cream applied to wound bed prior to debridement (In Clinic Only). Primary Wound Dressing: Wound #1 Right,Lateral Foot: Silver Collagen Wound #5 Right,Plantar Foot: Silver Alginate - pack small strip inside Wound #6 Right,Proximal,Anterior Lower Leg: Silver Collagen Secondary Dressing: Wound #1 Right,Lateral Foot: Boardered Foam Dressing Wound #6 Right,Proximal,Anterior Lower Leg: ABD and Kerlix/Conform Dressing Change Frequency: Wound #1 Right,Lateral Foot: Change dressing every other day. Wound #2 Right,Dorsal Foot: Once every week - as needed if canister loses suction or fills to top Wound #5 Right,Plantar Foot: Once every week - as needed if canister loses suction or fills to top Once every week Wound #6 Right,Proximal,Anterior Lower Leg: Change dressing every other day. Follow-up Appointments: Wound #1 Right,Lateral Foot: Return Appointment in 1 week. Wound #2 Right,Dorsal Foot: Return Appointment in 1 week. Wound #5 Right,Plantar Foot: Return Appointment in 1 week. Wound #6 Right,Proximal,Anterior Lower Leg: Return Appointment in 1 week. Edema Control: Wound #1 Right,Lateral Foot: Elevate legs to the level of the heart and pump ankles as often as possible - Tubigrip E Wound #2 Right,Dorsal Foot: Elevate legs to the  level of the heart and pump ankles as often as possible - Tubigrip E Wound #5 Right,Plantar Foot: Elevate legs to the level of the heart and pump ankles as often as possible - Tubigrip E Wound #6 Right,Proximal,Anterior Lower Leg: Elevate legs to the level of the heart and pump ankles as often as possible - Tubigrip E Off-Loading: Wound #1 Right,Lateral Foot: Other: - front off-loader right Wound #2 Right,Dorsal Foot: Other: - front off-loader right Wound #5 Right,Plantar Foot: Other: - front off-loader right Wound #6 Right,Proximal,Anterior Lower Leg: Other: - front off-loader right Additional Orders / Instructions: Wound #1 Right,Lateral Foot: Increase protein intake. Wound #2 Right,Dorsal Foot: Increase protein intake. Wound #5 Right,Plantar Foot: Increase protein intake. Wound #6 Right,Proximal,Anterior Lower Leg: Increase protein intake. 1. I would recommend at this point that we have the patient continue to utilize collagen for the lateral foot wound as well as for the lower leg wound at this time. 2. With regard to the dorsal foot wound we will get you the snap VAC on this location. For the plantar foot because I believe it may connect with the use of a small piece of alginate followed by DuoDERM to seal this off so that hopefully we can maintain seal and allow the snap VAC on the dorsal surface of the foot to function appropriately. Ralph Peterson, Ralph Peterson. ([id number]) 3. I am also can recommend that the patient continue with offloading. I do not want him walking or trying to stand on his foot as much as possible. We will see patient back for reevaluation in 1 week here in the clinic. If anything worsens or changes patient will contact our office for additional recommendations. Electronic Signature(s) Signed: 06/13/2020 3:30:36 PM By: Worthy Keeler PA-C Entered By: Worthy Keeler on 06/13/2020 15:30:35 Ralph Peterson  ([id number]) -------------------------------------------------------------------------------- SuperBill Details Patient Name: Ralph Peterson Date of Service: 06/13/2020 Medical Record Number: [id number] Patient Account Number: [id number] Date of Birth/Sex: Nov 24, 1965 (54 y.o. M) Treating RN: Cornell Barman Primary Care Provider: Vidal Schwalbe Other Clinician: Referring Provider: Vidal Schwalbe Treating Provider/Extender: Skipper Cliche in Treatment: 6 Diagnosis Coding ICD-10 Codes Code Description [phone] Pressure ulcer of other site, stage 3 T81.31XA Disruption of external operation (surgical) wound, not elsewhere classified, initial encounter L97.512 Non-pressure chronic ulcer of other part of  right foot with fat layer exposed L97.522 Non-pressure chronic ulcer of other part of left foot with fat layer exposed E11.621 Type 2 diabetes mellitus with foot ulcer I50.42 Chronic combined systolic (congestive) and diastolic (congestive) heart failure N18.6 End stage renal disease Z99.2 Dependence on renal dialysis Facility Procedures CPT4 Code: [id number] Description: 13086 NEG PRESS WND TX <=50 SQ CM Modifier: Quantity: 1 CPT4 Code: Description: ICD-10 Diagnosis Description T81.31XA Disruption of external operation (surgical) wound, not elsewhere classifie Modifier: d, initial encounter Quantity: Physician Procedures CPT4 Code: 5784696 Description: 99214 - WC PHYS LEVEL 4 - EST PT Modifier: 25 Quantity: 1 CPT4 Code: Description: ICD-10 Diagnosis Description L89.893 Pressure ulcer of other site, stage 3 T81.31XA Disruption of external operation (surgical) wound, not elsewhere classifi L97.512 Non-pressure chronic ulcer of other part of right foot with fat layer exp  L97.522 Non-pressure chronic ulcer of other part of left foot with fat layer expo Modifier: ed, initial encounter osed sed Quantity: Electronic Signature(s) Signed: 06/13/2020 3:30:52 PM By: Worthy Keeler  PA-C Entered By: Worthy Keeler on 06/13/2020 15:30:52

## 2020-06-19 NOTE — Progress Notes (Deleted)
Patient ID: Ralph Peterson, male    DOB: 01-02-1966, 54 y.o.   MRN: [id number]  HPI  Mr Lipsitz is a 54 y/o male with a history of atrial fibrillation, CAD, DM, hyperlipidemia, HTN, CKD, PAD, sleep apnea, anxiety, previous tobacco use and chronic HF.   Echo report from 03/07/20 reviewed and showed an EF of <20% along with severely elevated PA pressure, moderate LAE, moderate MR and moderate/ severe TR.   Admitted 03/07/20 due to acute on chronic HF along with new onset AF with RVR. Cardiology, nephrology and wound consults obtained. Initially placed on amiodarone and heparin drips then transitioned to oral medications. Elevated troponin thought to be due to demand ischemia. Discharged after 3 days.   He presents today for a follow-up visit with a chief complaint of   Past Medical History:  Diagnosis Date  . Anxiety   . Arrhythmia    atrial fibrillation  . CAD (coronary artery disease)    a. 09/2010 Cath/PCI ([facility]): LM nl, LAD 68m, D1 80 (small), LCX 57m, OM1 30, RI 70 (small), RCA 70 (DES).  . CHF (congestive heart failure) ([place])   . Coronary artery disease   . COVID-19 virus infection 07/2019  . Diabetes mellitus without complication ([place])   . ESRD (end stage renal disease) ([place])   . Hyperlipidemia   . Hypertension   . Ischemic cardiomyopathy    a.  12/2011 Echo ([facility]): NL EF, mod LVH. Mild AS/MS, triv PR/TR. 07/2019 Echo: EF 25-30%, GR1 DD, inf/post HK, low nl RV fxn, mildly dil RA, triv MR, mild Ao sclerosis w/o stenosis.  . NSTEMI (non-ST elevated myocardial infarction) ([place]) 07/2019  . PAD (peripheral artery disease) ([place])    a. 03/2016 s/p PTA/DEB R SFA/popliteal/peroneal; b. 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 08/2017 Atherectomy/DEB dist L SFA/popliteal. PTA of L AT; d. 06/2018 PTA/DEB L SFA/popliteal/PT/AT; e. 01/2019 Stable ABIs.  . Sleep apnea    Past Surgical History:  Procedure Laterality Date  . ABDOMINAL AORTOGRAM N/A 09/10/2017   Procedure: ABDOMINAL AORTOGRAM;   Surgeon: Wellington Hampshire, MD;  Location: [facility];  Service: Cardiovascular;  Laterality: N/A;  . AMPUTATION TOE Bilateral    one toe on right, all five toes on the left  . CARDIAC CATHETERIZATION  2012   [facility] Cardiology; X1 stent 2.5 x 33 mm xience stent Distal TCA  . LOWER EXTREMITY ANGIOGRAPHY Bilateral 09/10/2017   Procedure: Lower Extremity Angiography;  Surgeon: Wellington Hampshire, MD;  Location: [place] [facility];  Service: Cardiovascular;  Laterality: Bilateral;  . LOWER EXTREMITY ANGIOGRAPHY Left 06/23/2018   Procedure: LOWER EXTREMITY ANGIOGRAPHY;  Surgeon: Katha Cabal, MD;  Location: [place] [facility];  Service: Cardiovascular;  Laterality: Left;  . LOWER EXTREMITY ANGIOGRAPHY Right 02/01/2020   Procedure: LOWER EXTREMITY ANGIOGRAPHY;  Surgeon: Katha Cabal, MD;  Location: [place] [facility];  Service: Cardiovascular;  Laterality: Right;  . PERIPHERAL VASCULAR ATHERECTOMY Left 09/10/2017   Procedure: PERIPHERAL VASCULAR ATHERECTOMY;  Surgeon: Wellington Hampshire, MD;  Location: [place] [facility];  Service: Cardiovascular;  Laterality: Left;  SFA/POPLITEAL  . PERIPHERAL VASCULAR BALLOON ANGIOPLASTY Left 09/10/2017   Procedure: PERIPHERAL VASCULAR BALLOON ANGIOPLASTY;  Surgeon: Wellington Hampshire, MD;  Location: [place] [facility];  Service: Cardiovascular;  Laterality: Left;  ANTERIAL TIBIAL  . PERIPHERAL VASCULAR CATHETERIZATION Right 03/26/2016   Procedure: Lower Extremity Angiography;  Surgeon: Katha Cabal, MD;  Location: [place] [facility];  Service: Cardiovascular;  Laterality: Right;  . PERIPHERAL VASCULAR INTERVENTION Left 09/10/2017   Procedure: PERIPHERAL VASCULAR INTERVENTION;  Surgeon: Wellington Hampshire, MD;  Location: [place] [facility];  Service: Cardiovascular;  Laterality: Left;  SFA/POPLITEAL   Family History  Problem Relation Age of Onset  . Hypertension Mother    Social History   Tobacco Use  . Smoking status: Former Smoker     Types: Cigarettes    Quit date: 2010    Years since quitting: 11.9  . Smokeless tobacco: Never Used  Substance Use Topics  . Alcohol use: Not Currently   Allergies  Allergen Reactions  . Penicillins Rash    Has patient had a PCN reaction causing immediate rash, facial/tongue/throat swelling, SOB or lightheadedness with hypotension: Yes Has patient had a PCN reaction causing severe rash involving mucus membranes or skin necrosis: Yes Has patient had a PCN reaction that required hospitalization: No Has patient had a PCN reaction occurring within the last 10 years: No If all of the above answers are "NO", then may proceed with Cephalosporin use.      Review of Systems  Constitutional: Positive for appetite change (fluctuating) and fatigue.  HENT: Positive for congestion and rhinorrhea.   Eyes: Negative.   Respiratory: Positive for cough (productive) and shortness of breath (with moderate exertion). Negative for chest tightness.   Cardiovascular: Negative for chest pain, palpitations and leg swelling.  Gastrointestinal: Negative for abdominal distention and abdominal pain.  Endocrine: Negative.   Genitourinary: Negative.        Does not make urine  Musculoskeletal: Negative for back pain and neck pain.  Skin: Positive for wound (left lower leg/ right foot).  Allergic/Immunologic: Negative.   Neurological: Positive for light-headedness (with sudden position changes). Negative for dizziness.  Hematological: Negative for adenopathy. Bruises/bleeds easily.  Psychiatric/Behavioral: Positive for sleep disturbance (sleeping in recliner d/t comfort). Negative for dysphoric mood. The patient is not nervous/anxious.       Physical Exam Vitals and nursing note reviewed.  Constitutional:      Appearance: He is well-developed.  HENT:     Head: Normocephalic and atraumatic.  Cardiovascular:     Rate and Rhythm: Normal rate and regular rhythm.  Pulmonary:     Effort: Pulmonary effort is  normal. No respiratory distress.     Breath sounds: No wheezing or rales.  Abdominal:     Palpations: Abdomen is soft.     Tenderness: There is no abdominal tenderness.  Musculoskeletal:     Cervical back: Normal range of motion and neck supple.     Right lower leg: No tenderness. No edema.     Left lower leg: No tenderness. No edema.  Skin:    General: Skin is warm and dry.     Comments: Right foot with recent toe amputation and currently wrapped; left lower leg has 2 bandages where skin was weeping  Neurological:     General: No focal deficit present.     Mental Status: He is alert and oriented to person, place, and time.  Psychiatric:        Mood and Affect: Mood normal.        Behavior: Behavior normal.     Assessment & Plan:  1: Chronic heart failure with reduced ejection fraction- - NYHA class III - euvolemic today - scales given last visit; reminded to call for an overnight weight gain of >2 pounds or a weekly weight gain of >5 pounds; advised that his weight could fluctuate due to dialysis  days versus non-dialysis days - weight 214.8 pounds from last visit here 2 months ago - not adding salt and tries to read food labels for sodium content; already rinses off canned vegetables; reviewed the importance of keeping daily sodium intake to ~ 2000mg  / day; low sodium cookbook and dietary information given to patient and his son - drinking ~ 1L of fluids / day (per nephrology) - saw cardiology Nehemiah Massed) 05/04/20 - BNP 03/07/20 was 3819.1 - reports receiving both moderna covid vaccines - has not gotten his flu vaccine yet  2: HTN- - BP - BMP 06/07/20 reviewed and showed sodium 134, potassium 3.9, creatinine 6.42   3: DM- - A1c 05/03/20 was 6.9% - glucose in clinic today was  4: ESRD- - dialysis M,W,F - saw vascular ([facility]) 05/04/20 - saw the wound clinic 06/13/20   Medication bottles reviewed.

## 2020-06-20 ENCOUNTER — Encounter: Payer: Medicare PPO | Admitting: Physician Assistant

## 2020-06-20 ENCOUNTER — Telehealth: Payer: Self-pay | Admitting: Family

## 2020-06-20 ENCOUNTER — Other Ambulatory Visit: Payer: Self-pay

## 2020-06-20 ENCOUNTER — Ambulatory Visit: Payer: Medicare PPO | Admitting: Family

## 2020-06-20 DIAGNOSIS — L89893 Pressure ulcer of other site, stage 3: Secondary | ICD-10-CM | POA: Diagnosis not present

## 2020-06-20 NOTE — Telephone Encounter (Signed)
Patient did not show for his Heart Failure Clinic appointment on 06/20/20. Will attempt to reschedule.

## 2020-06-20 NOTE — Progress Notes (Addendum)
[name], [name] ([id number]) Visit Report for 06/20/2020 Chief Complaint Document Details Patient Name: Ralph Peterson, Ralph Peterson. Date of Service: 06/20/2020 8:00 AM Medical Record Number: [id number] Patient Account Number: [id number] Date of Birth/Sex: 03/22/1966 (54 y.o. 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 M) Treating RN: Cornell Barman Primary Care Provider: Vidal Schwalbe Other Clinician: Referring Provider: Vidal Schwalbe Treating Provider/Extender: Skipper Cliche in Treatment: 7 History of Present Illness HPI Description: He has been having with multiple ulcers of his feet. He does have a couple callus areas that have ulcerations underneath on the left foot. 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 Fortunately there is no sign of active infection at this time which is good news he has been using Telfa pads after applying Betadine this is not really absorbing enough of the drainage however which is causing the area especially at the amputation site to be very macerated. The patient states that he actually has a work-up with Dr. Delana Meyer at [facility] next Thursday. For that reason we did not perform ABIs today and again I really am not doing any significant debridement today we will discontinue mild things to remove some of the necrotic debris so that we hopefully get these wounds moving in the proper direction. With that being said his extremities do appear to be warm without any signs obviously of significant arterial flow. The patient does have a history of diabetes mellitus type 2, congestive heart failure, end- stage renal disease, and dependence on renal dialysis. He notes that he did have an exacerbation of heart failure recently which caused his legs to swell he had a couple wounds on his left leg but fortunately these have actually improved and are healed as of today. 05/09/2020 on evaluation today patient appears to be doing decently well all things considered in regard to his wounds. He does have the sutures out at this point in regard to his right amputation site. That does seem to be showing signs of improvement which is good news from the standpoint of at least being able to get some of the slough out although he does have quite a bit of slough buildup on the surface of the wound here. I think that is can require debridement before were able to see dramatic filling in. He may even require wound VAC at the site. With regard to the lateral foot this seems to be loosening up with the Santyl and I am pleased in that regard. In regard to the left foot everything appears to be healed and is doing great. 05/18/2020 on evaluation today patient appears to be doing well at this time in regard to his wounds. The wound on the lateral portion of his foot and the dorsal surface of his foot has been require sharp debridement of plantar aspect which was separated out from the  incision site from the ray amputation actually appears to be almost completely healed and is doing excellent. There is no signs of active infection at this  time. 05/26/2020 on evaluation today patient appears to be actually showing signs of improvement in general in regard to his wounds. The plantar aspect wound does connect to the dorsal surface wound which I initially expected but after last week was hopeful that that would not be the case. I was in fact hoping it would completely sealed up this week but it does not seem to have happened. There is no signs of active infection at this time which is great news. With that being said he does still have tender noted on the lateral aspect of the foot where the wound is there but this does not seem to be nearly as slough covered in fact I was able to carefully clean this off today and other than the fact that we need to help seal up the tendon I do not see any signs of infection at this point. 06/02/2020 upon evaluation today patient actually is making excellent improvement in regard to all of his wounds. In fact the plantar aspect of his foot looks like it may have sealed up although I cannot be 128% certain in this regard. With that being said I do see where there was some drainage on his dressing but today I cannot find any real depth to the wound opening and it appears to be close to closed if not completely. Nonetheless I do think that the dorsal surface of the foot is doing excellent in fact we have gotten approval for snap VAC but I am not even sure that skin to be necessary he is healing so well already. In regard to the lateral foot I do feel like the collagen has been beneficial here and my recommendation is good to be that we likely continue as such with this. Tenderness still exposed but seems to be nice and moist I see no issues in that regard. 06/09/2020 on evaluation today patient appears to be doing pretty well in regard to his foot ulcers although the plantar ulcer does appear to be deeper than what I was able to measure last week that is unfortunate. The lateral foot seems to be doing okay.  Fortunately there is no signs of active infection at this time. How are in regard to the blister on the lower leg on the right that opened last time this appears to be erythematous around and I feel like it is also warm to touch I am concerned about infection here. 06/13/2020 on evaluation today patient appears to be doing excellent in regard to his lateral foot wound is not measuring a lot smaller but it does look like it is fairly healthy at this time. His culture did show no signs of growth at this point which is good news. With that being said he has been taking the antibiotic and does feel like that has helped his leg is feeling better. This is good news. There are signs of new epithelial growth as well. With regard to his plantar foot this still shows signs of some depth its really slowly closing up and again I am not to concerned but and I cannot find where it directly connects to the area above but there may be some small connections. Either way I think that potentially the goal would be to try the snap VAC today to see if this could be beneficial I think it  may help to granulate in the upper wound and if we do that may be the plantar wound will show signs of improvement a little bit more sufficiently. 06/20/2020 upon evaluation today patient appears to be doing decently well in regard to his leg wound this is good news. Unfortunately the lateral foot wound seems to be somewhat dry at this point I think we continue to go back to the [place] that seem to be doing better for him. With that being said with regard to the snap VAC on the dorsal surface of his foot it apparently did not seal well and continued to leak unfortunately. He had a lot of issues and never was able to get it to function properly since we put this on last week. Nonetheless I did advise that we could give this another try putting on a little bit differently that may allow this to be able to function appropriately. Electronic  Signature(s) Signed: 06/20/2020 9:15:35 AM By: Worthy Keeler PA-C Entered By: Worthy Keeler on 06/20/2020 09:15:35 Ralph Peterson ([id number]) -------------------------------------------------------------------------------- Physical Exam Details Patient Name: Ralph Peterson Date of Service: 06/20/2020 8:00 AM Medical Record Number: [id number] Patient Account Number: [id number] Date of Birth/Sex: June 09, 1966 (54 y.o. M) Treating RN: Cornell Barman Primary Care Provider: Vidal Schwalbe Other Clinician: Referring Provider: Vidal Schwalbe Treating Provider/Extender: Skipper Cliche in Treatment: 7 Constitutional Well-nourished and well-hydrated in no acute distress. Respiratory normal breathing without difficulty. Psychiatric this patient is able to make decisions and demonstrates good insight into disease process. Alert and Oriented x 3. pleasant and cooperative. Notes Upon inspection patient's wound bed actually showed signs of good granulation at this time. There does not appear to be any evidence of active infection which is good news. There was some maceration just from the snap VAC cannot functioning quite appropriately. Nonetheless I do feel like that we could try this again today and Maudie Mercury feels like there may be a better way to put this on that she can make this work. Electronic Signature(s) Signed: 06/20/2020 9:16:06 AM By: Worthy Keeler PA-C Entered By: Worthy Keeler on 06/20/2020 09:16:06 Ralph Peterson ([id number]) -------------------------------------------------------------------------------- Physician Orders Details Patient Name: Ralph Peterson Date of Service: 06/20/2020 8:00 AM Medical Record Number: [id number] Patient Account Number: [id number] Date of Birth/Sex: 11/04/1965 (54 y.o. M) Treating RN: Cornell Barman Primary Care Provider: Vidal Schwalbe Other Clinician: Referring Provider: Vidal Schwalbe Treating Provider/Extender: Skipper Cliche in Treatment: 7 Verbal / Phone Orders: No Diagnosis Coding ICD-10 Coding Code Description [phone] Pressure ulcer of other site, stage 3 T81.31XA Disruption of external operation (surgical) wound, not elsewhere classified, initial encounter 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d L97.522 Non-pressure chronic ulcer of other part of left foot with fat layer exposed E11.621 Type 2 diabetes mellitus with foot ulcer I50.42 Chronic combined systolic (congestive) and diastolic (congestive) heart failure N18.6 End stage renal disease Z99.2 Dependence on renal dialysis Wound Cleansing Wound #1 Right,Lateral Foot o Clean wound with Normal Saline. Wound #2 Right,Dorsal Foot o Clean wound with Normal Saline. Wound #5 Right,Plantar Foot o Clean wound with Normal Saline. Wound #6 Right,Proximal,Anterior Lower Leg o Clean wound with Normal Saline. Anesthetic (add to Medication List) Wound #1 Right,Lateral Foot o Topical Lidocaine 4% cream applied to wound bed prior to debridement (In Clinic Only). Wound #2 Right,Dorsal Foot o Topical Lidocaine 4% cream applied to wound bed prior to debridement (In Clinic Only). Wound #5 Right,Plantar Foot   o Topical Lidocaine 4% cream applied to wound bed prior to debridement (In Clinic Only). Wound #6 Right,Proximal,Anterior Lower Leg o Topical Lidocaine 4% cream applied to wound bed prior to debridement (In Clinic Only). Primary Wound Dressing Wound #1 Right,Lateral Foot o Santyl Ointment Wound #5 Right,Plantar Foot o Silver Alginate - pack small strip inside Wound #6 Right,Proximal,Anterior Lower Leg o Silver Collagen Secondary Dressing Wound #1 Right,Lateral Foot o Boardered Foam Dressing Wound #6 Right,Proximal,Anterior Lower Leg o ABD and Kerlix/Conform Notz, Baran S. ([id number]) Dressing Change Frequency Wound #1 Right,Lateral Foot o Change dressing every  other day. Wound #2 Right,Dorsal Foot o Once every week - as needed if canister loses suction or fills to top Wound #5 Right,Plantar Foot o Once every week - as needed if canister loses suction or fills to top o Once every week Wound #6 Right,Proximal,Anterior Lower Leg o Change dressing every other day. Follow-up Appointments Wound #1 Right,Lateral Foot o Return Appointment in 1 week. Wound #2 Right,Dorsal Foot o Return Appointment in 1 week. Wound #5 Right,Plantar Foot o Return Appointment in 1 week. Wound #6 Right,Proximal,Anterior Lower Leg o Return Appointment in 1 week. Edema Control Wound #1 Right,Lateral Foot o Elevate legs to the level of the heart and pump ankles as often as possible - Tubigrip E Wound #2 Right,Dorsal Foot o Elevate legs to the level of the heart and pump ankles as often as possible - Tubigrip E Wound #5 Right,Plantar Foot o Elevate legs to the level of the heart and pump ankles as often as possible - Tubigrip E Wound #6 Right,Proximal,Anterior Lower Leg o Elevate legs to the level of the heart and pump ankles as often as possible - Tubigrip E Off-Loading Wound #1 Right,Lateral Foot o Other: - front off-loader right Wound #2 Right,Dorsal Foot o Other: - front off-loader right Wound #5 Right,Plantar Foot o Other: - front off-loader right Wound #6 Right,Proximal,Anterior Lower Leg o Other: - front off-loader right Additional Orders / Instructions Wound #1 Right,Lateral Foot o Increase protein intake. Wound #2 Right,Dorsal Foot o Increase protein intake. Wound #5 Right,Plantar Foot o Increase protein intake. Wound #6 Right,Proximal,Anterior Lower Leg o Increase protein intake. Negative Pressure Wound Therapy Wound #2 Right,Dorsal Foot o Snap Vac applied - If canister fills before next visit, remove vac dressing and apply a dry gauze dressing. Call the office for additional orders. CLEMMIE, MARXEN  ([id number]) Wound #5 Right,Plantar Foot o Snap Vac applied - If canister fills before next visit, remove vac dressing and apply a dry gauze dressing. Call the office for additional orders. Electronic Signature(s) Signed: 06/20/2020 5:00:58 PM By: Worthy Keeler PA-C Signed: 06/23/2020 4:55:43 PM By: Gretta Cool, BSN, RN, CWS, Kim RN, BSN Entered By: Gretta Cool, BSN, RN, CWS, Kim on 06/20/2020 09:22:06 Ralph Peterson ([id number]) -------------------------------------------------------------------------------- Problem List Details Patient Name: KANON, NOVOSEL. Date of Service: 06/20/2020 8:00 AM Medical Record Number: [id number] Patient Account Number: [id number] Date of Birth/Sex: 01/05/1966 (54 y.o. M) Treating RN: Cornell Barman Primary Care Provider: Vidal Schwalbe Other Clinician: Referring Provider: Vidal Schwalbe Treating Provider/Extender: Skipper Cliche in Treatment: 7 Active Problems ICD-10 Encounter Code Description Active Date MDM Diagnosis L89.893 Pressure ulcer of other site, stage 3 04/27/2020 No Yes T81.31XA Disruption of external operation (surgical) wound, not elsewhere 04/27/2020 No Yes classified, initial encounter L97.512 Non-pressure chronic ulcer of other part of right foot with fat layer 04/27/2020 No Yes exposed L97.522 Non-pressure chronic ulcer of other part of left foot with fat layer 04/27/2020 No Yes  exposed E11.621 Type 2 diabetes mellitus with foot ulcer 04/27/2020 No Yes I50.42 Chronic combined systolic (congestive) and diastolic (congestive) heart 04/27/2020 No Yes failure N18.6 End stage renal disease 04/27/2020 No Yes Z99.2 Dependence on renal dialysis 04/27/2020 No Yes Inactive Problems Resolved Problems Electronic Signature(s) Signed: 06/20/2020 8:33:46 AM By: Worthy Keeler PA-C Entered By: Worthy Keeler on 06/20/2020 08:33:46 Ralph Peterson  ([id number]) -------------------------------------------------------------------------------- Progress Note Details Patient Name: Ralph Peterson. Date of Service: 06/20/2020 8:00 AM Medical Record Number: [id number] Patient Account Number: [id number] Date of Birth/Sex: 1966-07-05 (54 y.o. 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 He does have a couple callus areas that have ulcerations underneath on the left foot. 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 Fortunately there is no sign of active infection at this time which is good news he has been using Telfa pads after applying Betadine this is not really absorbing enough of the drainage however which is causing the area especially at the amputation site to be very macerated. The patient states that he actually has a work-up with Dr. Delana Meyer at [facility] next Thursday. For that reason we did not perform ABIs today and again I really am not doing any significant debridement today we will discontinue mild things to remove some of the necrotic debris so that we hopefully get these wounds moving in the proper direction. With that being said his extremities do appear to be warm without any signs obviously of significant arterial flow. The patient does have a history of diabetes mellitus type 2, congestive heart failure, end- stage renal disease, and dependence on renal dialysis. He notes that he did have an exacerbation of heart failure recently  which caused his legs to swell he had a couple wounds on his left leg but fortunately these have actually improved and are healed as of today. 05/09/2020 on evaluation today patient appears to be doing decently well all things considered in regard to his wounds. He does have the sutures out at this point in regard to his right amputation site. That does seem to be showing signs of improvement which is good news from the standpoint of at least being able to get some of the slough out although he does have quite a bit of slough buildup on the surface of the wound here. I think that is can require debridement before were able to see dramatic filling in. He may even require wound VAC at the site. With regard to the lateral foot this seems to be loosening up with the Santyl and I am pleased in that regard. In regard to the left foot everything appears to be healed and is doing great. 05/18/2020 on evaluation today patient appears to be doing well at this time in regard to his wounds. The wound on the lateral portion of his foot and the dorsal surface of his foot has been require sharp debridement of plantar aspect which was separated out from the incision site from the ray amputation actually appears to be almost completely healed and  is doing excellent. There is no signs of active infection at this time. 05/26/2020 on evaluation today patient appears to be actually showing signs of improvement in general in regard to his wounds. The plantar aspect wound does connect to the dorsal surface wound which I initially expected but after last week was hopeful that that would not be the case. I was in fact hoping it would completely sealed up this week but it does not seem to have happened. There is no signs of active infection at this time which is great news. With that being said he does still have tender noted on the lateral aspect of the foot where the wound is there but this does not seem to be nearly as  slough covered in fact I was able to carefully clean this off today and other than the fact that we need to help seal up the tendon I do not see any signs of infection at this point. 06/02/2020 upon evaluation today patient actually is making excellent improvement in regard to all of his wounds. In fact the plantar aspect of his foot looks like it may have sealed up although I cannot be 751% certain in this regard. With that being said I do see where there was some drainage on his dressing but today I cannot find any real depth to the wound opening and it appears to be close to closed if not completely. Nonetheless I do think that the dorsal surface of the foot is doing excellent in fact we have gotten approval for snap VAC but I am not even sure that skin to be necessary he is healing so well already. In regard to the lateral foot I do feel like the collagen has been beneficial here and my recommendation is good to be that we likely continue as such with this. Tenderness still exposed but seems to be nice and moist I see no issues in that regard. 06/09/2020 on evaluation today patient appears to be doing pretty well in regard to his foot ulcers although the plantar ulcer does appear to be deeper than what I was able to measure last week that is unfortunate. The lateral foot seems to be doing okay. Fortunately there is no signs of active infection at this time. How are in regard to the blister on the lower leg on the right that opened last time this appears to be erythematous around and I feel like it is also warm to touch I am concerned about infection here. 06/13/2020 on evaluation today patient appears to be doing excellent in regard to his lateral foot wound is not measuring a lot smaller but it does look like it is fairly healthy at this time. His culture did show no signs of growth at this point which is good news. With that being said he has been taking the antibiotic and does feel like that  has helped his leg is feeling better. This is good news. There are signs of new epithelial growth as well. With regard to his plantar foot this still shows signs of some depth its really slowly closing up and again I am not to concerned but and I cannot find where it directly connects to the area above but there may be some small connections. Either way I think that potentially the goal would be to try the snap VAC today to see if this could be beneficial I think it may help to granulate in the upper wound and if we do that may  be the plantar wound will show signs of improvement a little bit more sufficiently. 06/20/2020 upon evaluation today patient appears to be doing decently well in regard to his leg wound this is good news. Unfortunately the lateral foot wound seems to be somewhat dry at this point I think we continue to go back to the [place] that seem to be doing better for him. With that being said with regard to the snap VAC on the dorsal surface of his foot it apparently did not seal well and continued to leak unfortunately. He had a lot of issues and never was able to get it to function properly since we put this on last week. Nonetheless I did advise that we could give this another try putting on a little bit differently that may allow this to be able to function appropriately. KEOLA, HENINGER ([id number]) Objective Constitutional Well-nourished and well-hydrated in no acute distress. Vitals Time Taken: 8:09 AM, Height: 72 in, Weight: 225 lbs, BMI: 30.5, Temperature: 98.3 F, Pulse: 69 bpm, Respiratory Rate: 18 breaths/min, Blood Pressure: 154/96 mmHg. Respiratory normal breathing without difficulty. Psychiatric this patient is able to make decisions and demonstrates good insight into disease process. Alert and Oriented x 3. pleasant and cooperative. General Notes: Upon inspection patient's wound bed actually showed signs of good granulation at this time. There does not appear  to be any evidence of active infection which is good news. There was some maceration just from the snap VAC cannot functioning quite appropriately. Nonetheless I do feel like that we could try this again today and Maudie Mercury feels like there may be a better way to put this on that she can make this work. Integumentary (Hair, Skin) Wound #1 status is Open. Original cause of wound was Pressure Injury. The wound is located on the Right,Lateral Foot. The wound measures 2.3cm length x 1.6cm width x 0.2cm depth; 2.89cm^2 area and 0.578cm^3 volume. There is joint, tendon, and Fat Layer (Subcutaneous Tissue) exposed. There is a medium amount of serosanguineous drainage noted. The wound margin is distinct with the outline attached to the wound base. There is large (67-100%) red granulation within the wound bed. There is no necrotic tissue within the wound bed. Wound #2 status is Open. Original cause of wound was Surgical Injury. The wound is located on the Right,Dorsal Foot. The wound measures 3.6cm length x 1.1cm width x 1cm depth; 3.11cm^2 area and 3.11cm^3 volume. There is Fat Layer (Subcutaneous Tissue) exposed. There is a large amount of serosanguineous drainage noted. The wound margin is distinct with the outline attached to the wound base. There is medium (34-66%) red, hyper - granulation within the wound bed. There is no necrotic tissue within the wound bed. Wound #5 status is Open. Original cause of wound was Surgical Injury. The wound is located on the [place]. The wound measures 0.1cm length x 0.1cm width x 2.8cm depth; 0.008cm^2 area and 0.022cm^3 volume. There is a medium amount of serous drainage noted. The wound margin is distinct with the outline attached to the wound base. There is no granulation within the wound bed. There is a large (67-100%) amount of necrotic tissue within the wound bed including Adherent Slough. Wound #6 status is Open. Original cause of wound was Gradually  Appeared. The wound is located on the Right,Proximal,Anterior Lower Leg. The wound measures 4cm length x 2cm width x 0.1cm depth; 6.283cm^2 area and 0.628cm^3 volume. There is Fat Layer (Subcutaneous Tissue) exposed. There is no tunneling or undermining noted. There  is a medium amount of serosanguineous drainage noted. The wound margin is flat and intact. There is large (67-100%) red granulation within the wound bed. There is no necrotic tissue within the wound bed. 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 Wound #2 Right,Dorsal Foot: Clean wound with Normal Saline. Wound #5 Right,Plantar Foot: NAKOA, GANUS S. ([id number]) Clean wound with Normal Saline. Wound #6 Right,Proximal,Anterior Lower Leg: Clean wound with Normal Saline. Anesthetic (add to Medication List): Wound #1 Right,Lateral Foot: Topical Lidocaine 4% cream applied to wound bed prior to debridement (In Clinic Only). Wound #2 Right,Dorsal Foot: Topical Lidocaine 4% cream applied to wound bed prior to debridement (In Clinic Only). Wound #5 Right,Plantar Foot: Topical Lidocaine 4% cream applied to wound bed prior to debridement (In Clinic Only). Wound #6 Right,Proximal,Anterior Lower Leg: Topical Lidocaine 4% cream applied to wound bed prior to debridement (In Clinic Only). Primary Wound Dressing: Wound #1 Right,Lateral Foot: Santyl Ointment Wound #5 Right,Plantar Foot: Silver Alginate  - pack small strip inside Wound #6 Right,Proximal,Anterior Lower Leg: Silver Collagen Secondary Dressing: Wound #1 Right,Lateral Foot: Boardered Foam Dressing Wound #6 Right,Proximal,Anterior Lower Leg: ABD and Kerlix/Conform Dressing Change Frequency: Wound #1 Right,Lateral Foot: Change dressing every other day. Wound #2 Right,Dorsal Foot: Once every week - as needed if canister loses suction or fills to top Wound #5 Right,Plantar Foot: Once every week - as needed if canister loses suction or fills to top Once every week Wound #6 Right,Proximal,Anterior Lower Leg: Change dressing every other day. Follow-up Appointments: Wound #1 Right,Lateral Foot: Return Appointment in 1 week. Wound #2 Right,Dorsal Foot: Return Appointment in 1 week. Wound #5 Right,Plantar Foot: Return Appointment in 1 week. Wound #6 Right,Proximal,Anterior Lower Leg: Return Appointment in 1 week. Edema Control: Wound #1 Right,Lateral Foot: Elevate legs to the level of the heart and pump ankles as often as possible - Tubigrip E Wound #2 Right,Dorsal Foot: Elevate legs to the level of the heart and pump ankles as often as possible - Tubigrip E Wound #5 Right,Plantar Foot: Elevate legs to the level of the heart and pump ankles as often as possible - Tubigrip E Wound #6 Right,Proximal,Anterior Lower Leg: Elevate legs to the level of the heart and pump ankles as often as possible - Tubigrip E Off-Loading: Wound #1 Right,Lateral Foot: Other: - front off-loader right Wound #2 Right,Dorsal Foot: Other: - front off-loader right Wound #5 Right,Plantar Foot: Other: - front off-loader right Wound #6 Right,Proximal,Anterior Lower Leg: Other: - front off-loader right Additional Orders / Instructions: Wound #1 Right,Lateral Foot: Increase protein intake. Wound #2 Right,Dorsal Foot: Increase protein intake. Wound #5 Right,Plantar Foot: Increase protein intake. Wound #6 Right,Proximal,Anterior Lower  Leg: Increase protein intake. Negative Pressure Wound Therapy: Wound #2 Right,Dorsal Foot: Snap Vac applied - If canister fills before next visit, remove vac dressing and apply a dry gauze dressing. Call the office for additional orders. Wound #5 Right,Plantar Foot: Snap Vac applied - If canister fills before next visit, remove vac dressing and apply a dry gauze dressing. Call the office for additional orders. Michelin, Ermine  S. ([id number]) 1. Would recommend at this time that we have the patient going to continue with the wound care measures as before with regard to the snap VAC for the dorsal wound and just put a piece of alginate and DuoDERM on the plantar surface of the foot. 2. I am also can recommend at this time that we go back to [facility] for the lateral foot wound with a contact layer over top a border foam dressing. 3. We will continue with collagen to the leg ulcer which seems to be doing quite well. We will see patient back for reevaluation in 1 week here in the clinic. If anything worsens or changes patient will contact our office for additional recommendations. Electronic Signature(s) Signed: 06/20/2020 9:26:52 AM By: Worthy Keeler PA-C Entered By: Worthy Keeler on 06/20/2020 09:26:52 Ralph Peterson ([id number]) -------------------------------------------------------------------------------- SuperBill Details Patient Name: Ralph Peterson Date of Service: 06/20/2020 Medical Record Number: [id number] Patient Account Number: [id number] Date of Birth/Sex: 05-31-1966 (54 y.o. M) Treating RN: Cornell Barman Primary Care Provider: Vidal Schwalbe Other Clinician: Referring Provider: Vidal Schwalbe Treating Provider/Extender: Skipper Cliche in Treatment: 7 Diagnosis Coding ICD-10 Codes Code Description [phone] Pressure ulcer of other site, stage 3 T81.31XA Disruption of external operation (surgical) wound, not elsewhere classified, initial encounter L97.512  Non-pressure chronic ulcer of other part of right foot with fat layer exposed L97.522 Non-pressure chronic ulcer of other part of left foot with fat layer exposed E11.621 Type 2 diabetes mellitus with foot ulcer I50.42 Chronic combined systolic (congestive) and diastolic (congestive) heart failure N18.6 End stage renal disease Z99.2 Dependence on renal dialysis Facility Procedures CPT4 Code: [id number] Description: 86381 NEG PRESS WND TX <=50 SQ CM Modifier: Quantity: 1 Physician Procedures CPT4 Code: [id number] Description: 99214 - WC PHYS LEVEL 4 - EST PT Modifier: Quantity: 1 CPT4 Code: Description: ICD-10 Diagnosis Description L89.893 Pressure ulcer of other site, stage 3 T81.31XA Disruption of external operation (surgical) wound, not elsewhere classifi L97.512 Non-pressure chronic ulcer of other part of right foot with fat layer exp  L97.522 Non-pressure chronic ulcer of other part of left foot with fat layer expo Modifier: ed, initial encounter osed sed Quantity: Electronic Signature(s) Signed: 06/20/2020 9:27:18 AM By: Worthy Keeler PA-C Entered By: Worthy Keeler on 06/20/2020 09:27:18

## 2020-06-23 NOTE — Progress Notes (Signed)
CLAYSON, RILING ([id number]) Visit Report for 06/20/2020 Arrival Information Details Patient Name: Ralph Peterson, Ralph Peterson. Date of Service: 06/20/2020 8:00 AM Medical Record Number: [id number] Patient Account Number: [id number] Date of Birth/Sex: [birth year] (54 y.o. M) Treating RN: Dolan Amen Primary Care Edmonson Carmack: Vidal Schwalbe Other Clinician: Referring Shanterica Biehler: Vidal Schwalbe Treating Madaline Lefeber/Extender: Skipper Cliche in Treatment: 7 Visit Information History Since Last Visit Pain Present Now: No Patient Arrived: Wheel Chair Arrival Time: 08:08 Accompanied By: son Transfer Assistance: None Patient Identification Verified: Yes Secondary Verification Process Completed: Yes Patient Requires Transmission-Based Precautions: No Patient Has Alerts: No Electronic Signature(s) Signed: 06/20/2020 5:11:54 PM By: Georges Mouse, Minus Breeding Entered By: Georges Mouse, Minus Breeding on 06/20/2020 08:09:14 Ralph Peterson ([id number]) -------------------------------------------------------------------------------- Encounter Discharge Information Details Patient Name: Ralph Peterson Date of Service: 06/20/2020 8:00 AM Medical Record Number: [id number] Patient Account Number: [id number] Date of Birth/Sex: 01/15/1966 (54 y.o. M) Treating RN: Cornell Barman Primary Care Amaira Safley: Vidal Schwalbe Other Clinician: Referring Athleen Feltner: Vidal Schwalbe Treating Karigan Cloninger/Extender: Skipper Cliche in Treatment: 7 Encounter Discharge Information Items Discharge Condition: Stable Ambulatory Status: Ambulatory Discharge Destination: Home Transportation: Private Auto Accompanied By: self Schedule Follow-up Appointment: Yes Clinical Summary of Care: Electronic Signature(s) Signed: 06/23/2020 4:55:43 PM By: Gretta Cool, BSN, RN, CWS, Kim RN, BSN Entered By: Gretta Cool, BSN, RN, CWS, Kim on 06/20/2020 09:25:08 Ralph Peterson  ([id number]) -------------------------------------------------------------------------------- Lower Extremity Assessment Details Patient Name: [name], [name]. Date of Service: 06/20/2020 8:00 AM Medical Record Number: [id number] Patient Account Number: [id number] Date of Birth/Sex: August 19, 1965 (54 y.o. M) Treating RN: Dolan Amen Primary Care Corrigan Kretschmer: Vidal Schwalbe Other Clinician: Referring Nivia Gervase: Vidal Schwalbe Treating Finesse Fielder/Extender: Jeri Cos Weeks in Treatment: 7 Edema Assessment Assessed: [Left: No] [Right: Yes] Edema: [Left: N] [Right: o] Electronic Signature(s) Signed: 06/20/2020 5:11:54 PM By: Georges Mouse, Minus Breeding Entered By: Georges Mouse, Minus Breeding on 06/20/2020 08:31:24 Ralph Peterson ([id number]) -------------------------------------------------------------------------------- Multi Wound Chart Details Patient Name: Ralph Peterson Date of Service: 06/20/2020 8:00 AM Medical Record Number: [id number] Patient Account Number: [id number] Date of Birth/Sex: Jul 03, 1966 (54 y.o. 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 Comorbid History: Arrhythmia, Congestive Heart Arrhythmia, Congestive Heart Arrhythmia, Congestive Heart Failure, Coronary Artery Disease, Failure, Coronary Artery Disease, Failure, Coronary Artery Disease, Hypertension, Myocardial Infarction, Hypertension, Myocardial Infarction,  Hypertension, Myocardial Infarction, Peripheral Venous Disease, Type II Peripheral Venous Disease, Type II Peripheral Venous Disease, Type II Diabetes, Neuropathy Diabetes, Neuropathy Diabetes, Neuropathy Date Acquired: 02/20/2020 04/20/2020 03/24/2020 Weeks of Treatment: 7 7 4  Wound Status: Open Open Open Measurements L x W x D (cm) 2.3x1.6x0.2 3.6x1.1x1 0.1x0.1x0.3 Area (cm) : 2.89 3.11 0.008 Volume (cm) : 0.578 3.11 0.002 % Reduction in Area: 42.90% 34.00% 98.80% % Reduction in Volume: 77.10% -119.90% 99.40% Classification: Category/Stage IV Full Thickness Without Exposed Grade 1 Support Structures Exudate Amount: Medium Large Medium Exudate Type: Serosanguineous Serosanguineous Serous Exudate Color: red, brown red, brown amber Wound Margin: Distinct, outline attached Distinct, outline attached Distinct, outline attached Granulation Amount: Large (67-100%) Medium (34-66%) None Present (0%) Granulation Quality: Red Red, Hyper-granulation N/A Necrotic Amount: None Present (0%) None Present (0%) Large (67-100%) Exposed Structures:  Fat Layer (Subcutaneous Tissue): Fat Layer (Subcutaneous Tissue): Fascia: No Yes Yes Fat Layer (Subcutaneous Tissue): Tendon: Yes Fascia: No No Joint: Yes Tendon: No Tendon: No Fascia: No Muscle: No Muscle: No Muscle: No Joint: No Joint: No Bone: No Bone: No Bone: No Epithelialization: None Small (1-33%) None Wound Number: 6 N/A N/A Photos: N/A N/A Ralph Peterson ([id number]) Wound Location: Right, Proximal, Anterior Lower Leg N/A N/A Wounding Event: Gradually Appeared N/A N/A Primary Etiology: Diabetic Wound/Ulcer of the Lower N/A N/A Extremity Comorbid History: Arrhythmia, Congestive Heart N/A N/A Failure, Coronary Artery Disease, Hypertension, Myocardial Infarction, Peripheral Venous Disease, Type II Diabetes, Neuropathy Date Acquired: 06/02/2020 N/A N/A Weeks of Treatment: 1 N/A N/A Wound Status: Open N/A N/A Measurements L x W  x D (cm) 4x2x0.1 N/A N/A Area (cm) : 6.283 N/A N/A Volume (cm) : 0.628 N/A N/A % Reduction in Area: 50.60% N/A N/A % Reduction in Volume: 50.60% N/A N/A Classification: Grade 1 N/A N/A Exudate Amount: Medium N/A N/A Exudate Type: Serosanguineous N/A N/A Exudate Color: red, brown N/A N/A Wound Margin: Flat and Intact N/A N/A Granulation Amount: Large (67-100%) N/A N/A Granulation Quality: Red N/A N/A Necrotic Amount: None Present (0%) N/A N/A Exposed Structures: Fat Layer (Subcutaneous Tissue): N/A N/A Yes Fascia: No Tendon: No Muscle: No Joint: No Bone: No Epithelialization: Medium (34-66%) N/A N/A Treatment Notes Electronic Signature(s) Signed: 06/23/2020 4:55:43 PM By: Gretta Cool, BSN, RN, CWS, Kim RN, BSN Entered By: Gretta Cool, BSN, RN, CWS, Kim on 06/20/2020 08:43:18 Ralph Peterson ([id number]) -------------------------------------------------------------------------------- Multi-Disciplinary Care Plan Details Patient Name: Ralph Peterson Date of Service: 06/20/2020 8:00 AM Medical Record Number: [id number] Patient Account Number: [id number] Date of Birth/Sex: Oct 07, 1965 (54 y.o. M) Treating RN: Cornell Barman Primary Care Gabby Rackers: Vidal Schwalbe Other Clinician: Referring Katasha Riga: Vidal Schwalbe Treating Jaiquan Temme/Extender: Skipper Cliche in Treatment: 7 Active Inactive Necrotic Tissue Nursing Diagnoses: Impaired tissue integrity related to necrotic/devitalized tissue Knowledge deficit related to management of necrotic/devitalized tissue Goals: Necrotic/devitalized tissue will be minimized in the wound bed Date Initiated: 05/09/2020 Target Resolution Date: 05/09/2020 Goal Status: Active Interventions: Assess patient pain level pre-, during and post procedure and prior to discharge Provide education on necrotic tissue and debridement process Treatment Activities: Enzymatic debridement : 05/09/2020 Notes: Orientation to the Wound Care Program Nursing  Diagnoses: Knowledge deficit related to the wound healing center program Goals: Patient/caregiver will verbalize understanding of the [facility] Program Date Initiated: 04/27/2020 Target Resolution Date: 05/18/2020 Goal Status: Active Interventions: Provide education on orientation to the wound center Notes: Wound/Skin Impairment Nursing Diagnoses: Impaired tissue integrity Knowledge deficit related to ulceration/compromised skin integrity Goals: Patient/caregiver will verbalize understanding of skin care regimen Date Initiated: 04/27/2020 Target Resolution Date: 05/25/2020 Goal Status: Active Interventions: Assess patient/caregiver ability to obtain necessary supplies Assess patient/caregiver ability to perform ulcer/skin care regimen upon admission and as needed Assess ulceration(s) every visit Provide education on ulcer and skin care Treatment Activities: Skin care regimen initiated : 04/27/2020 TEVAN, MARIAN ([id number]) Topical wound management initiated : 04/27/2020 Notes: Electronic Signature(s) Signed: 06/23/2020 4:55:43 PM By: Gretta Cool, BSN, RN, CWS, Kim RN, BSN Entered By: Gretta Cool, BSN, RN, CWS, Kim on 06/20/2020 08:43:05 Ralph Peterson ([id number]) -------------------------------------------------------------------------------- Pain Assessment Details Patient Name: Ralph Peterson. Date of Service: 06/20/2020 8:00 AM Medical Record Number: [id number] Patient Account Number: [id number] Date of Birth/Sex: November 15, 1965 (54 y.o. M) Treating RN: Dolan Amen Primary Care Dynasty Holquin: Vidal Schwalbe Other Clinician: Referring Marin Wisner: Vidal Schwalbe Treating Daanya Lanphier/Extender: Skipper Cliche in Treatment: 7 Active  Problems Location of Pain Severity and Description of Pain Patient Has Paino No Site Locations Rate the pain. Current Pain Level: 0 Pain Management and Medication Current Pain Management: Electronic Signature(s) Signed: 06/20/2020  5:11:54 PM By: Georges Mouse, Minus Breeding Entered By: Georges Mouse, Minus Breeding on 06/20/2020 08:11:44 Ralph Peterson ([id number]) -------------------------------------------------------------------------------- Patient/Caregiver Education Details Patient Name: Ralph Peterson Date of Service: 06/20/2020 8:00 AM Medical Record Number: [id number] Patient Account Number: [id number] Date of Birth/Gender: [birth year] (54 y.o. M) Treating RN: Cornell Barman Primary Care Physician: Vidal Schwalbe Other Clinician: Referring Physician: Vidal Schwalbe Treating Physician/Extender: Skipper Cliche in Treatment: 7 Education Assessment Education Provided To: Patient Education Topics Provided Wound/Skin Impairment: Handouts: Caring for Your Ulcer Methods: Demonstration, Explain/Verbal Responses: State content correctly Electronic Signature(s) Signed: 06/23/2020 4:55:43 PM By: Gretta Cool, BSN, RN, CWS, Kim RN, BSN Entered By: Gretta Cool, BSN, RN, CWS, Kim on 06/20/2020 09:22:48 Ralph Peterson ([id number]) -------------------------------------------------------------------------------- Wound Assessment Details Patient Name: Ralph Peterson Date of Service: 06/20/2020 8:00 AM Medical Record Number: [id number] Patient Account Number: [id number] Date of Birth/Sex: Jun 18, 1966 (54 y.o. M) Treating RN: Dolan Amen Primary Care Jimma Ortman: Vidal Schwalbe Other Clinician: Referring Leta Bucklin: Vidal Schwalbe Treating Dia Jefferys/Extender: Skipper Cliche in Treatment: 7 Wound Status Wound Number: 1 Primary Pressure Ulcer Etiology: Wound Location: Right, Lateral Foot Wound Open Wounding Event: Pressure Injury Status: Date Acquired: 02/20/2020 Comorbid Arrhythmia, Congestive Heart Failure, Coronary Artery Weeks Of Treatment: 7 History: Disease, Hypertension, Myocardial Infarction, Peripheral Clustered Wound: No Venous Disease, Type II Diabetes, Neuropathy Photos Wound Measurements Length:  (cm) 2.3 % Red Width: (cm) 1.6 % Red Depth: (cm) 0.2 Epith Area: (cm) 2.89 Volume: (cm) 0.578 uction in Area: 42.9% uction in Volume: 77.1% elialization: None Wound Description Classification: Category/Stage IV Foul Wound Margin: Distinct, outline attached Sloug Exudate Amount: Medium Exudate Type: Serosanguineous Exudate Color: red, brown Odor After Cleansing: No h/Fibrino No Wound Bed Granulation Amount: Large (67-100%) Exposed Structure Granulation Quality: Red Fascia Exposed: No Necrotic Amount: None Present (0%) Fat Layer (Subcutaneous Tissue) Exposed: Yes Tendon Exposed: Yes Muscle Exposed: No Joint Exposed: Yes Bone Exposed: No Treatment Notes Wound #1 (Right, Lateral Foot) 1. Cleansed with: Clean wound with Normal Saline 3. Peri-wound Care: Skin Prep ARELI, JOWETT ([id number]) Notes [id number] lower leg; Santl R lateral foot; Silver alginate duoderm, plantar foot, Snap vac dorsal foot Electronic Signature(s) Signed: 06/20/2020 5:11:54 PM By: Georges Mouse, Minus Breeding Entered By: Georges Mouse, Minus Breeding on 06/20/2020 08:28:15 Ralph Peterson ([id number]) -------------------------------------------------------------------------------- Wound Assessment Details Patient Name: Ralph Peterson. Date of Service: 06/20/2020 8:00 AM Medical Record Number: [id number] Patient Account Number: [id number] Date of Birth/Sex: [birth year] (54 y.o. M) Treating RN: Dolan Amen Primary Care Xiana Carns: Vidal Schwalbe Other Clinician: Referring Quintasha Gren: Vidal Schwalbe Treating Lateia Fraser/Extender: Skipper Cliche in Treatment: 7 Wound Status Wound Number: 2 Primary Open Surgical Wound Etiology: Wound Location: Right, Dorsal Foot Wound Open Wounding Event: Surgical Injury Status: Date Acquired: 04/20/2020 Comorbid Arrhythmia, Congestive Heart Failure, Coronary Artery Weeks Of Treatment: 7 History: Disease, Hypertension, Myocardial Infarction,  Peripheral Clustered Wound: No Venous Disease, Type II Diabetes, Neuropathy Photos Wound Measurements Length: (cm) 3.6 Width: (cm) 1.1 Depth: (cm) 1 Area: (cm) 3.11 Volume: (cm) 3.11 % Reduction in Area: 34% % Reduction in Volume: -119.9% Epithelialization: Small (1-33%) Wound Description Classification: Full Thickness Without Exposed Support Structu Wound Margin: Distinct, outline attached Exudate Amount: Large Exudate Type: Serosanguineous Exudate Color: red, brown res Foul Odor After Cleansing: No Slough/Fibrino Yes Wound Bed Granulation Amount: Medium (34-66%) Exposed Structure Granulation Quality: Red,  Hyper-granulation Fascia Exposed: No Necrotic Amount: None Present (0%) Fat Layer (Subcutaneous Tissue) Exposed: Yes Tendon Exposed: No Muscle Exposed: No Joint Exposed: No Bone Exposed: No Treatment Notes Wound #2 (Right, Dorsal Foot) 1. Cleansed with: Clean wound with Normal Saline 3. Peri-wound Care: Skin Prep MARKO, SKALSKI ([id number]) Notes [id number] lower leg; Santl R lateral foot; Silver alginate duoderm, plantar foot, Snap vac dorsal foot Electronic Signature(s) Signed: 06/20/2020 5:11:54 PM By: Georges Mouse, Minus Breeding Entered By: Georges Mouse, Minus Breeding on 06/20/2020 08:29:10 Ralph Peterson ([id number]) -------------------------------------------------------------------------------- Wound Assessment Details Patient Name: Ralph Peterson. Date of Service: 06/20/2020 8:00 AM Medical Record Number: [id number] Patient Account Number: [id number] Date of Birth/Sex: 1965/07/29 (54 y.o. M) Treating RN: Cornell Barman Primary Care Breyon Sigg: Vidal Schwalbe Other Clinician: Referring Faye Sanfilippo: Vidal Schwalbe Treating Ellora Varnum/Extender: Skipper Cliche in Treatment: 7 Wound Status Wound Number: 5 Primary Diabetic Wound/Ulcer of the Lower Extremity Etiology: Wound Location: Right, Plantar Foot Wound Open Wounding Event: Surgical  Injury Status: Date Acquired: 03/24/2020 Comorbid Arrhythmia, Congestive Heart Failure, Coronary Artery Weeks Of Treatment: 4 History: Disease, Hypertension, Myocardial Infarction, Peripheral Clustered Wound: No Venous Disease, Type II Diabetes, Neuropathy Photos Wound Measurements Length: (cm) 0.1 % Redu Width: (cm) 0.1 % Redu Depth: (cm) 2.8 Epithe Area: (cm) 0.008 Volume: (cm) 0.022 ction in Area: 98.8% ction in Volume: 93.4% lialization: None Wound Description Classification: Grade 1 Foul O Wound Margin: Distinct, outline attached Slough Exudate Amount: Medium Exudate Type: Serous Exudate Color: amber dor After Cleansing: No /Fibrino Yes Wound Bed Granulation Amount: None Present (0%) Exposed Structure Necrotic Amount: Large (67-100%) Fascia Exposed: No Necrotic Quality: Adherent Slough Fat Layer (Subcutaneous Tissue) Exposed: No Tendon Exposed: No Muscle Exposed: No Joint Exposed: No Bone Exposed: No Treatment Notes Wound #5 (Right, Plantar Foot) 1. Cleansed with: Clean wound with Normal Saline 3. Peri-wound Care: Skin Prep SABINO, DENNING ([id number]) Notes [id number]-P lower leg; Santl R lateral foot; Silver alginate duoderm, plantar foot, Snap vac dorsal foot Electronic Signature(s) Signed: 06/23/2020 4:55:43 PM By: Gretta Cool, BSN, RN, CWS, Kim RN, BSN Entered By: Gretta Cool, BSN, RN, CWS, Kim on 06/20/2020 08:46:05 Ralph Peterson ([id number]) -------------------------------------------------------------------------------- Wound Assessment Details Patient Name: Ralph Peterson. Date of Service: 06/20/2020 8:00 AM Medical Record Number: [id number] Patient Account Number: [id number] Date of Birth/Sex: Oct 16, 1965 (54 y.o. M) Treating RN: Dolan Amen Primary Care Gabbi Whetstone: Vidal Schwalbe Other Clinician: Referring Phinehas Grounds: Vidal Schwalbe Treating Andres Escandon/Extender: Skipper Cliche in Treatment: 7 Wound Status Wound Number: 6 Primary Diabetic  Wound/Ulcer of the Lower Extremity Etiology: Wound Location: Right, Proximal, Anterior Lower Leg Wound Open Wounding Event: Gradually Appeared Status: Date Acquired: 06/02/2020 Comorbid Arrhythmia, Congestive Heart Failure, Coronary Artery Weeks Of Treatment: 1 History: Disease, Hypertension, Myocardial Infarction, Peripheral Clustered Wound: No Venous Disease, Type II Diabetes, Neuropathy Photos Wound Measurements Length: (cm) 4 % Width: (cm) 2 % Depth: (cm) 0.1 Ep Area: (cm) 6.283 T Volume: (cm) 0.628 U Reduction in Area: 50.6% Reduction in Volume: 50.6% ithelialization: Medium (34-66%) unneling: No ndermining: No Wound Description Classification: Grade 1 Fo Wound Margin: Flat and Intact Sl Exudate Amount: Medium Exudate Type: Serosanguineous Exudate Color: red, brown ul Odor After Cleansing: No ough/Fibrino No Wound Bed Granulation Amount: Large (67-100%) Exposed Structure Granulation Quality: Red Fascia Exposed: No Necrotic Amount: None Present (0%) Fat Layer (Subcutaneous Tissue) Exposed: Yes Tendon Exposed: No Muscle Exposed: No Joint Exposed: No Bone Exposed: No Treatment Notes Wound #6 (Right, Proximal, Anterior Lower Leg) 1. Cleansed with: Clean wound with Normal Saline 3. Peri-wound Care:  Skin Prep TRAVARIUS, LANGE ([id number]) Notes [id number]-M lower leg; Santl R lateral foot; Silver alginate duoderm, plantar foot, Snap vac dorsal foot Electronic Signature(s) Signed: 06/20/2020 5:11:54 PM By: Georges Mouse, Minus Breeding Entered By: Georges Mouse, Minus Breeding on 06/20/2020 08:30:00 Ralph Peterson ([id number]) -------------------------------------------------------------------------------- Vitals Details Patient Name: Ralph Peterson Date of Service: 06/20/2020 8:00 AM Medical Record Number: [id number] Patient Account Number: [id number] Date of Birth/Sex: 1965/12/05 (55 y.o. M) Treating RN: Dolan Amen Primary Care Zoelle Markus: Vidal Schwalbe Other Clinician: Referring Uchechi Denison: Vidal Schwalbe Treating Sukari Grist/Extender: Skipper Cliche in Treatment: 7 Vital Signs Time Taken: 08:09 Temperature (F): 98.3 Height (in): 72 Pulse (bpm): 69 Weight (lbs): 225 Respiratory Rate (breaths/min): 18 Body Mass Index (BMI): 30.5 Blood Pressure (mmHg): 154/96 Reference Range: 80 - 120 mg / dl Electronic Signature(s) Signed: 06/20/2020 5:11:54 PM By: Georges Mouse, Minus Breeding Entered By: Georges Mouse, Minus Breeding on 06/20/2020 08:11:32

## 2020-06-27 ENCOUNTER — Other Ambulatory Visit: Payer: Self-pay

## 2020-06-27 ENCOUNTER — Encounter: Payer: Medicare PPO | Attending: Physician Assistant | Admitting: Physician Assistant

## 2020-06-27 DIAGNOSIS — I132 Hypertensive heart and chronic kidney disease with heart failure and with stage 5 chronic kidney disease, or end stage renal disease: Secondary | ICD-10-CM | POA: Insufficient documentation

## 2020-06-27 DIAGNOSIS — I5042 Chronic combined systolic (congestive) and diastolic (congestive) heart failure: Secondary | ICD-10-CM | POA: Diagnosis not present

## 2020-06-27 DIAGNOSIS — N186 End stage renal disease: Secondary | ICD-10-CM | POA: Insufficient documentation

## 2020-06-27 DIAGNOSIS — L97512 Non-pressure chronic ulcer of other part of right foot with fat layer exposed: Secondary | ICD-10-CM | POA: Insufficient documentation

## 2020-06-27 DIAGNOSIS — E11621 Type 2 diabetes mellitus with foot ulcer: Secondary | ICD-10-CM | POA: Diagnosis not present

## 2020-06-27 DIAGNOSIS — Z89421 Acquired absence of other right toe(s): Secondary | ICD-10-CM | POA: Insufficient documentation

## 2020-06-27 DIAGNOSIS — Z992 Dependence on renal dialysis: Secondary | ICD-10-CM | POA: Insufficient documentation

## 2020-06-27 DIAGNOSIS — L89893 Pressure ulcer of other site, stage 3: Secondary | ICD-10-CM | POA: Insufficient documentation

## 2020-06-27 DIAGNOSIS — T8781 Dehiscence of amputation stump: Secondary | ICD-10-CM | POA: Diagnosis not present

## 2020-06-27 DIAGNOSIS — E1122 Type 2 diabetes mellitus with diabetic chronic kidney disease: Secondary | ICD-10-CM | POA: Diagnosis not present

## 2020-06-27 NOTE — Progress Notes (Addendum)
Ralph, Peterson ([id number]) Visit Report for 06/27/2020 Chief Complaint Document Details Patient Name: Ralph, Peterson. Date of Service: 06/27/2020 11:00 AM Medical Record Number: [id number] Patient Account Number: [id number] Date of Birth/Sex: 1965/09/06 (54 y.o. 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 M) Treating RN: Cornell Barman Primary Care Provider: Vidal Schwalbe Other Clinician: Referring Provider: Vidal Schwalbe Treating Provider/Extender: Skipper Cliche in Treatment: 8 Debridement Performed for Wound #5 Right,Plantar Foot Assessment: Performed By: Physician Tommie Sams., PA-C Debridement Type: Debridement Severity of Tissue Pre Debridement: Limited to breakdown of skin Level of Consciousness (Pre- Awake and Alert procedure): Pre-procedure Verification/Time Out Yes - 11:34 Taken: Total Area Debrided (L x W): 0.3 (cm) x 0.3 (cm) = 0.09 (cm) Tissue and other material Viable, Non-Viable, Skin: Dermis , Skin: Epidermis debrided: Level: Skin/Epidermis Debridement Description: Selective/Open Wound Instrument: Curette Bleeding: Minimum Hemostasis Achieved: Pressure Response to Treatment: Procedure was tolerated well Level of Consciousness  (Post- Awake and Alert procedure): Post Debridement Measurements of Total Wound Length: (cm) 0.3 Width: (cm) 0.3 Depth: (cm) 3 Volume: (cm) 0.212 Character of Wound/Ulcer Post Debridement: Stable Severity of Tissue Post Debridement: Fat layer exposed Post Procedure Diagnosis Same as Pre-procedure Electronic Signature(s) Signed: 06/27/2020 5:20:48 PM By: Gretta Cool, BSN, RN, CWS, Kim RN, BSN Signed: 06/27/2020 5:22:05 PM By: Worthy Keeler PA-C Entered By: Gretta Cool, BSN, RN, CWS, Kim on 06/27/2020 11:35:19 Ralph Peterson ([id number]) -------------------------------------------------------------------------------- Debridement Details Patient Name: Ralph Peterson. Date of Service: 06/27/2020 11:00 AM Medical Record Number: [id number] Patient Account Number: [id number] Date of Birth/Sex: 12/05/1965 (54 y.o. M) Treating RN: Cornell Barman Primary Care Provider: Vidal Schwalbe Other Clinician: Referring Provider: Vidal Schwalbe Treating Provider/Extender: Skipper Cliche in Treatment: 8 Debridement Performed for Wound #1 Right,Lateral Foot Assessment: Performed By: Physician Tommie Sams., PA-C Debridement Type: Debridement Level of Consciousness (Pre- Awake and Alert procedure): Pre-procedure Verification/Time Out Yes - 11:34 Taken: Total Area Debrided (L x W): 2 (cm) x 1.2 (cm) = 2.4 (cm) Tissue and other material Viable, Non-Viable, Slough, Subcutaneous, Skin: Dermis , Skin: Epidermis, Slough debrided: Level: Skin/Subcutaneous Tissue Debridement Description: Excisional Instrument: Curette Bleeding: Minimum Hemostasis Achieved: Pressure Response to Treatment: Procedure was tolerated well Level of Consciousness (Post- Awake and Alert procedure): Post Debridement Measurements of Total Wound Length: (cm) 2 Stage: Category/Stage IV Width: (cm) 1.2 Depth: (cm) 0.3 Volume: (cm) 0.565 Character of Wound/Ulcer Post Debridement: Stable Post Procedure Diagnosis Same  as Pre-procedure Notes tendon exposed Electronic Signature(s) Signed: 06/27/2020 5:20:48 PM By: Gretta Cool, BSN, RN, CWS, Kim RN, BSN Signed: 06/27/2020 5:22:05 PM By: Worthy Keeler PA-C Entered By: Gretta Cool, BSN, RN, CWS, Kim on 06/27/2020 11:36:17 Ralph Peterson ([id number]) -------------------------------------------------------------------------------- Debridement Details Patient Name: Ralph Peterson. Date of Service: 06/27/2020 11:00 AM Medical Record Number: [id number] Patient Account Number: [id number] Date of Birth/Sex: Jun 24, 1966 (54 y.o. M) Treating RN: Cornell Barman Primary Care Provider: Vidal Schwalbe Other Clinician: Referring Provider: Vidal Schwalbe Treating Provider/Extender: Skipper Cliche in Treatment: 8 Debridement Performed for Wound #  6 Right,Proximal,Anterior Lower Leg Assessment: Performed By: Physician Tommie Sams., PA-C Debridement Type: Debridement Severity of Tissue Pre Debridement: Fat layer exposed Level of Consciousness (Pre- Awake and Alert procedure): Pre-procedure Verification/Time Out Yes - 11:34 Taken: Pain Control: Lidocaine Total Area Debrided (L x W): 1 (cm) x 0.4 (cm) = 0.4 (cm) Tissue and other material Viable, Non-Viable, Skin: Dermis , Skin: Epidermis, Fibrin/Exudate, Other: adheared dressing debrided: Level: Skin/Epidermis Debridement Description: Selective/Open Wound Instrument: Curette Bleeding: Minimum Hemostasis Achieved: Pressure Response to Treatment: Procedure was tolerated well Level of Consciousness (Post- Awake and Alert procedure): Post Debridement Measurements of Total Wound Length: (cm) 1 Width: (cm) 0.4 Depth: (cm) 0.1 Volume: (cm) 0.031 Character of Wound/Ulcer Post Debridement: Stable Severity of Tissue Post Debridement: Limited to breakdown of skin Post Procedure Diagnosis Same as Pre-procedure Electronic Signature(s) Signed: 06/27/2020 5:20:48 PM By: Gretta Cool, BSN, RN, CWS, Kim RN, BSN Signed:  06/27/2020 5:22:05 PM By: Worthy Keeler PA-C Entered By: Gretta Cool, BSN, RN, CWS, Kim on 06/27/2020 11:41:46 Ralph Peterson ([id number]) -------------------------------------------------------------------------------- HPI Details Patient Name: Ralph Peterson. Date of Service: 06/27/2020 11:00 AM Medical Record Number: [id number] Patient Account Number: [id number] Date of Birth/Sex: [birth year] (54 y.o. M) Treating RN: Cornell Barman Primary Care Provider: Vidal Schwalbe Other Clinician: Referring Provider: Vidal Schwalbe Treating Provider/Extender: Skipper Cliche in Treatment: 8 History of Present Illness HPI Description: He has been having with multiple ulcers of his feet. He does have a couple callus areas that have ulcerations underneath on the left foot. 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 Fortunately there is no sign of active infection at this time which is good news he has been using Telfa pads after applying Betadine this is not really absorbing enough of the drainage however which is causing the area especially at the amputation site to be very macerated. The patient states that he actually has a work-up with Dr. Delana Meyer at [facility] next Thursday. For that reason we did not perform ABIs today and again I really am not doing any significant debridement today we will discontinue mild things to remove some of the necrotic debris so that we hopefully get these wounds moving in the proper direction. With that being said his extremities do appear to be warm without any signs obviously of significant arterial flow. The patient does have a history of diabetes mellitus type 2, congestive heart failure, end- stage renal disease, and dependence on renal dialysis. He notes that he did have an exacerbation of heart failure recently which caused his  legs to swell he had a couple wounds on his left leg but fortunately these have actually improved and are healed as of today. 05/09/2020 on evaluation today patient appears to be doing decently well all things considered in regard to his wounds. He does have the sutures out at this point in regard to his right amputation site. That does seem to be showing signs of improvement which is good news from the standpoint of at least being able to get some of the slough out although he does have quite a bit of slough buildup on the surface of the wound here. I think that is can require debridement before were able to see dramatic filling in. He may even require wound VAC at the site. With regard to the lateral foot this seems to be loosening  up with the Santyl and I am pleased in that regard. In regard to the left foot everything appears to be healed and is doing great. 05/18/2020 on evaluation today patient appears to be doing well at this time in regard to his wounds. 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 There is no signs of active infection at this time. 05/26/2020 on evaluation today patient appears to be actually showing signs of improvement in general in regard to his wounds. The plantar aspect wound does connect to the dorsal surface wound which I initially expected but after last week was hopeful that that would not be the case. I was in fact hoping it would completely sealed up this week but it does not seem to have happened. There is no signs of active infection at this time which is great news. With that being said he does still have tender noted on the lateral aspect of the foot where the wound is there but this does not seem to be nearly as slough covered in fact  I was able to carefully clean this off today and other than the fact that we need to help seal up the tendon I do not see any signs of infection at this point. 06/02/2020 upon evaluation today patient actually is making excellent improvement in regard to all of his wounds. In fact the plantar aspect of his foot looks like it may have sealed up although I cannot be 606% certain in this regard. With that being said I do see where there was some drainage on his dressing but today I cannot find any real depth to the wound opening and it appears to be close to closed if not completely. Nonetheless I do think that the dorsal surface of the foot is doing excellent in fact we have gotten approval for snap VAC but I am not even sure that skin to be necessary he is healing so well already. In regard to the lateral foot I do feel like the collagen has been beneficial here and my recommendation is good to be that we likely continue as such with this. Tenderness still exposed but seems to be nice and moist I see no issues in that regard. 06/09/2020 on evaluation today patient appears to be doing pretty well in regard to his foot ulcers although the plantar ulcer does appear to be deeper than what I was able to measure last week that is unfortunate. The lateral foot seems to be doing okay. Fortunately there is no signs of active infection at this time. How are in regard to the blister on the lower leg on the right that opened last time this appears to be erythematous around and I feel like it is also warm to touch I am concerned about infection here. 06/13/2020 on evaluation today patient appears to be doing excellent in regard to his lateral foot wound is not measuring a lot smaller but it does look like it is fairly healthy at this time. His culture did show no signs of growth at this point which is good news. With that being said he has been taking the antibiotic and does feel like that has helped his leg is  feeling better. This is good news. There are signs of new epithelial  growth as well. With regard to his plantar foot this still shows signs of some depth its really slowly closing up and again I am not to concerned but and I cannot find where it directly connects to the area above but there may be some small connections. Either way I think that potentially the goal would be to try the snap VAC today to see if this could be beneficial I think it may help to granulate in the upper wound and if we do that may be the plantar wound will show signs of improvement a little bit more sufficiently. 06/20/2020 upon evaluation today patient appears to be doing decently well in regard to his leg wound this is good news. Unfortunately the lateral foot wound seems to be somewhat dry at this point I think we continue to go back to the [place] that seem to be doing better for him. With that being said with regard to the snap VAC on the dorsal surface of his foot it apparently did not seal well and continued to leak unfortunately. He had a lot of issues and never was able to get it to function properly since we put this on last week. Nonetheless I did advise that we could give this another try putting on a little bit differently that may allow this to be able to function appropriately. 06/27/2020 upon evaluation today patient appears to be doing very well in regard to his left foot dorsal wound. This is actually showing signs of excellent improvement and overall seems to be managing quite nicely in my opinion. With that being said I do feel like that the plantar aspect of his foot is a little bit more significant at this time he still has some depth and we really have not gotten this to feeling very well at all. The lateral portion of the foot the Santyl does seem to be helping clear away and the leg is going require some debridement to remove some of the dead tissue and skin from around the edges of the wound but overall  seems to be doing much better I think that is very close to complete closure. Electronic Signature(s) RUDI, BUNYARD ([id number]) Signed: 06/27/2020 3:10:20 PM By: Worthy Keeler PA-C Entered By: Worthy Keeler on 06/27/2020 15:10:19 Ralph Peterson ([id number]) -------------------------------------------------------------------------------- Physical Exam Details Patient Name: Ralph Peterson Date of Service: 06/27/2020 11:00 AM Medical Record Number: [id number] Patient Account Number: [id number] Date of Birth/Sex: [birth year] (54 y.o. M) Treating RN: Cornell Barman Primary Care Provider: Vidal Schwalbe Other Clinician: Referring Provider: Vidal Schwalbe Treating Provider/Extender: Skipper Cliche in Treatment: 8 Constitutional Well-nourished and well-hydrated in no acute distress. Respiratory normal breathing without difficulty. Psychiatric this patient is able to make decisions and demonstrates good insight into disease process. Alert and Oriented x 3. pleasant and cooperative. Notes Patient's wounds actually show signs of improvement today on the dorsal surface of the foot as well as the leg. The plantar aspect of the foot as well as the lateral portion of the foot are still a work in progress to be honest at this time. We will get a continue work on this we will likely make some changes today Electronic Signature(s) Signed: 06/27/2020 4:20:46 PM By: Worthy Keeler PA-C Entered By: Worthy Keeler on 06/27/2020 16:20:46 Ralph Peterson ([id number]) -------------------------------------------------------------------------------- Physician Orders Details Patient Name: Ralph Peterson Date of Service: 06/27/2020 11:00 AM Medical Record Number: [id number] Patient Account Number: [id number] Date of Birth/Sex: Jul 02, 1966 (54 y.o.  M) Treating RN: Cornell Barman Primary Care Provider: Vidal Schwalbe Other Clinician: Referring Provider: Vidal Schwalbe Treating  Provider/Extender: Skipper Cliche in Treatment: 8 Verbal / Phone Orders: No Diagnosis Coding ICD-10 Coding Code Description [phone] Pressure ulcer of other site, stage 3 T81.31XA Disruption of external operation (surgical) wound, not elsewhere classified, initial encounter 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d L97.522 Non-pressure chronic ulcer of other part of left foot with fat layer exposed E11.621 Type 2 diabetes mellitus with foot ulcer I50.42 Chronic combined systolic (congestive) and diastolic (congestive) heart failure N18.6 End stage renal disease Z99.2 Dependence on renal dialysis Wound Cleansing Wound #1 Right,Lateral Foot o Clean wound with Normal Saline. o Antibacterial soap, wash wounds, rinse and pat dry prior to dressing wounds Wound #2 Right,Dorsal Foot o Clean wound with Normal Saline. o Antibacterial soap, wash wounds, rinse and pat dry prior to dressing wounds Wound #5 Right,Plantar Foot o Clean wound with Normal Saline. o Antibacterial soap, wash wounds, rinse and pat dry prior to dressing wounds Wound #6 Right,Proximal,Anterior Lower Leg o Clean wound with Normal Saline. o Antibacterial soap, wash wounds, rinse and pat dry prior to dressing wounds Anesthetic (add to Medication List) Wound #1 Right,Lateral Foot o Topical Lidocaine 4% cream applied to wound bed prior to debridement (In Clinic Only). Wound #2 Right,Dorsal Foot o Topical Lidocaine 4% cream applied to wound bed prior to debridement (In Clinic Only). Wound #5 Right,Plantar Foot o Topical Lidocaine 4% cream applied to wound bed prior to debridement (In Clinic Only). Wound #6 Right,Proximal,Anterior Lower Leg o Topical Lidocaine 4% cream applied to wound bed prior to debridement (In Clinic Only). Primary Wound Dressing Wound #1 Right,Lateral Foot o Santyl Ointment Wound #5 Right,Plantar Foot o Hydrafera Blue Ready  Transfer - rope packed gently into wound Wound #6 Right,Proximal,Anterior Lower Leg o Xeroform gauze Wound #2 Right,Dorsal Foot o Silver Collagen Secondary Dressing AMIRI, RIECHERS. ([id number]) Wound #2 Right,Dorsal Foot o Boardered Foam Dressing Wound #1 Right,Lateral Foot o ABD and Kerlix/Conform Wound #5 Right,Plantar Foot o ABD and Kerlix/Conform Wound #6 Right,Proximal,Anterior Lower Leg o ABD and Kerlix/Conform Dressing Change Frequency Wound #1 Right,Lateral Foot o Change dressing every day. Wound #5 Right,Plantar Foot o Change dressing every day. Wound #2 Right,Dorsal Foot o Three times weekly Wound #6 Right,Proximal,Anterior Lower Leg o Three times weekly Follow-up Appointments Wound #1 Right,Lateral Foot o Return Appointment in 1 week. Wound #2 Right,Dorsal Foot o Return Appointment in 1 week. Wound #5 Right,Plantar Foot o Return Appointment in 1 week. Wound #6 Right,Proximal,Anterior Lower Leg o Return Appointment in 1 week. Edema Control Wound #1 Right,Lateral Foot o Elevate legs to the level of the heart and pump ankles as often as possible - Tubigrip E Wound #2 Right,Dorsal Foot o Elevate legs to the level of the heart and pump ankles as often as possible - Tubigrip E Wound #5 Right,Plantar Foot o Elevate legs to the level of the heart and pump ankles as often as possible - Tubigrip E Wound #6 Right,Proximal,Anterior Lower Leg o Elevate legs to the level of the heart and pump ankles as often as possible - Tubigrip E Off-Loading Wound #1 Right,Lateral Foot o Other: - front off-loader right Wound #2 Right,Dorsal Foot o Other: - front off-loader right Wound #5 Right,Plantar Foot o Other: - front off-loader right Wound #6 Right,Proximal,Anterior Lower Leg o Other: - front off-loader right Additional Orders / Instructions Wound #1 Right,Lateral Foot o Increase protein intake. Wound #  2 Right,Dorsal  Foot o Increase protein intake. Wound #5 Right,Plantar Foot Clinkscales, Niccolo S. ([id number]) o Increase protein intake. Wound #6 Right,Proximal,Anterior Lower Leg o Increase protein intake. Electronic Signature(s) Signed: 06/27/2020 1:00:35 PM By: Gretta Cool, BSN, RN, CWS, Kim RN, BSN Signed: 06/27/2020 5:22:05 PM By: Worthy Keeler PA-C Entered By: Gretta Cool, BSN, RN, CWS, Kim on 06/27/2020 13:00:33 Ralph Peterson ([id number]) -------------------------------------------------------------------------------- Problem List Details Patient Name: DALLAS, TOROK. Date of Service: 06/27/2020 11:00 AM Medical Record Number: [id number] Patient Account Number: [id number] Date of Birth/Sex: 1965/12/15 (54 y.o. M) Treating RN: Cornell Barman Primary Care Provider: Vidal Schwalbe Other Clinician: Referring Provider: Vidal Schwalbe Treating Provider/Extender: Skipper Cliche in Treatment: 8 Active Problems ICD-10 Encounter Code Description Active Date MDM Diagnosis L89.893 Pressure ulcer of other site, stage 3 04/27/2020 No Yes T81.31XA Disruption of external operation (surgical) wound, not elsewhere 04/27/2020 No Yes classified, initial encounter L97.512 Non-pressure chronic ulcer of other part of right foot with fat layer 04/27/2020 No Yes exposed L97.812 Non-pressure chronic ulcer of other part of right lower leg with fat layer 04/27/2020 No Yes exposed E11.621 Type 2 diabetes mellitus with foot ulcer 04/27/2020 No Yes I50.42 Chronic combined systolic (congestive) and diastolic (congestive) heart 04/27/2020 No Yes failure N18.6 End stage renal disease 04/27/2020 No Yes Z99.2 Dependence on renal dialysis 04/27/2020 No Yes Inactive Problems Resolved Problems Electronic Signature(s) Signed: 06/27/2020 4:27:47 PM By: Worthy Keeler PA-C Previous Signature: 06/27/2020 11:05:13 AM Version By: Worthy Keeler PA-C Entered By: Worthy Keeler on 06/27/2020 16:27:46 Ralph Peterson  ([id number]) -------------------------------------------------------------------------------- Progress Note Details Patient Name: Ralph Peterson. Date of Service: 06/27/2020 11:00 AM Medical Record Number: [id number] Patient Account Number: [id number] Date of Birth/Sex: 1965-10-02 (54 y.o. 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 He does have a couple callus areas that have ulcerations underneath on the left foot. 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 Fortunately there is no sign of active infection at this time which is good news he has been using Telfa pads after applying Betadine this is not really absorbing enough of the drainage however which is causing the area especially at the amputation site to be very macerated. The patient states that he actually has a work-up with Dr. Delana Meyer at [facility] vascular next Thursday. For that reason we did not perform ABIs today and again I really am not doing any significant debridement today we will discontinue mild things to remove some of the necrotic debris so that we hopefully get these wounds moving in the proper direction. With that being said his extremities do appear to be warm without any signs obviously of significant arterial flow. The patient does have a history of diabetes mellitus type 2, congestive heart failure, end- stage renal disease, and dependence on renal dialysis. He notes that he did have an exacerbation of heart failure recently  which caused his legs to swell he had a couple wounds on his left leg but fortunately these have actually improved and are healed as of today. 05/09/2020 on evaluation  today patient appears to be doing decently well all things considered in regard to his wounds. He does have the sutures out at this point in regard to his right amputation site. That does seem to be showing signs of improvement which is good news from the standpoint of at least being able to get some of the slough out although he does have quite a bit of slough buildup on the surface of the wound here. I think that is can require debridement before were able to see dramatic filling in. He may even require wound VAC at the site. With regard to the lateral foot this seems to be loosening up with the Santyl and I am pleased in that regard. In regard to the left foot everything appears to be healed and is doing great. 05/18/2020 on evaluation today patient appears to be doing well at this time in regard to his wounds. 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 There is no signs of active infection at this time. 05/26/2020 on evaluation today patient appears to be actually showing signs of improvement in general in regard to his wounds. The plantar aspect wound does connect to the dorsal surface wound which I initially expected but after last week was hopeful that that would not be the case. I was in fact hoping it would completely sealed up this week but it does not seem to have happened. There is no signs of active infection at this time which is great news. With that being said he does still have tender noted on the lateral aspect of the foot where the wound is there but this does not seem to be nearly as  slough covered in fact I was able to carefully clean this off today and other than the fact that we need to help seal up the tendon I do not see any signs of infection at this point. 06/02/2020 upon evaluation today patient actually is making excellent improvement in regard to all of his wounds. In fact the plantar aspect of his foot looks like it may have sealed up although I cannot be 536% certain in this regard. With that being said I do see where there was some drainage on his dressing but today I cannot find any real depth to the wound opening and it appears to be close to closed if not completely. Nonetheless I do think that the dorsal surface of the foot is doing excellent in fact we have gotten approval for snap VAC but I am not even sure that skin to be necessary he is healing so well already. In regard to the lateral foot I do feel like the collagen has been beneficial here and my recommendation is good to be that we likely continue as such with this. Tenderness still exposed but seems to be nice and moist I see no issues in that regard. 06/09/2020 on evaluation today patient appears to be doing pretty well in regard to his foot ulcers although the plantar ulcer does appear to be deeper than what I was able to measure last week that is unfortunate. The lateral foot seems to be doing okay. Fortunately there is no signs of active infection at this time. How are in regard to the blister on the lower leg  on the right that opened last time this appears to be erythematous around and I feel like it is also warm to touch I am concerned about infection here. 06/13/2020 on evaluation today patient appears to be doing excellent in regard to his lateral foot wound is not measuring a lot smaller but it does look like it is fairly healthy at this time. His culture did show no signs of growth at this point which is good news. With that being said he has been taking the antibiotic and does feel like that  has helped his leg is feeling better. This is good news. There are signs of new epithelial growth as well. With regard to his plantar foot this still shows signs of some depth its really slowly closing up and again I am not to concerned but and I cannot find where it directly connects to the area above but there may be some small connections. Either way I think that potentially the goal would be to try the snap VAC today to see if this could be beneficial I think it may help to granulate in the upper wound and if we do that may be the plantar wound will show signs of improvement a little bit more sufficiently. 06/20/2020 upon evaluation today patient appears to be doing decently well in regard to his leg wound this is good news. Unfortunately the lateral foot wound seems to be somewhat dry at this point I think we continue to go back to the [place] that seem to be doing better for him. With that being said with regard to the snap VAC on the dorsal surface of his foot it apparently did not seal well and continued to leak unfortunately. He had a lot of issues and never was able to get it to function properly since we put this on last week. Nonetheless I did advise that we could give this another try putting on a little bit differently that may allow this to be able to function appropriately. 06/27/2020 upon evaluation today patient appears to be doing very well in regard to his left foot dorsal wound. This is actually showing signs of excellent improvement and overall seems to be managing quite nicely in my opinion. With that being said I do feel like that the plantar aspect of his foot is a little bit more significant at this time he still has some depth and we really have not gotten this to feeling very well at all. The lateral portion of the foot the Santyl does seem to be helping clear away and the leg is going require some debridement to remove some of the dead Dearinger, Hoa S.  ([id number]) tissue and skin from around the edges of the wound but overall seems to be doing much better I think that is very close to complete closure. Objective Constitutional Well-nourished and well-hydrated in no acute distress. Vitals Time Taken: 11:07 AM, Height: 72 in, Weight: 225 lbs, BMI: 30.5, Temperature: 97.7 F, Pulse: 78 bpm, Respiratory Rate: 18 breaths/min, Blood Pressure: 132/86 mmHg. Respiratory normal breathing without difficulty. Psychiatric this patient is able to make decisions and demonstrates good insight into disease process. Alert and Oriented x 3. pleasant and cooperative. General Notes: Patient's wounds actually show signs of improvement today on the dorsal surface of the foot as well as the leg. The plantar aspect of the foot as well as the lateral portion of the foot are still a work in progress to be honest at this time. We will  get a continue work on this we will likely make some changes today Integumentary (Hair, Skin) Wound #1 status is Open. Original cause of wound was Pressure Injury. The wound is located on the Right,Lateral Foot. The wound measures 2cm length x 1.2cm width x 0.2cm depth; 1.885cm^2 area and 0.377cm^3 volume. There is joint, tendon, and Fat Layer (Subcutaneous Tissue) exposed. There is no tunneling or undermining noted. There is a medium amount of serosanguineous drainage noted. The wound margin is distinct with the outline attached to the wound base. There is medium (34-66%) pale granulation within the wound bed. There is a medium (34- 66%) amount of necrotic tissue within the wound bed. Wound #2 status is Open. Original cause of wound was Surgical Injury. The wound is located on the Right,Dorsal Foot. The wound measures 3cm length x 0.7cm width x 0.4cm depth; 1.649cm^2 area and 0.66cm^3 volume. There is Fat Layer (Subcutaneous Tissue) exposed. There is no tunneling or undermining noted. There is a large amount of serosanguineous drainage  noted. The wound margin is distinct with the outline attached to the wound base. There is large (67-100%) red granulation within the wound bed. There is no necrotic tissue within the wound bed. Wound #5 status is Open. Original cause of wound was Surgical Injury. The wound is located on the [place]. The wound measures 0.1cm length x 0.1cm width x 3cm depth; 0.008cm^2 area and 0.024cm^3 volume. There is no tunneling or undermining noted. There is a medium amount of serous drainage noted. The wound margin is distinct with the outline attached to the wound base. There is no granulation within the wound bed. There is no necrotic tissue within the wound bed. General Notes: Unable to visualize wound bed at this time due to wound size and depth. Wound #6 status is Open. Original cause of wound was Gradually Appeared. The wound is located on the Right,Proximal,Anterior Lower Leg. The wound measures 0.8cm length x 0.3cm width x 0.1cm depth; 0.188cm^2 area and 0.019cm^3 volume. There is Fat Layer (Subcutaneous Tissue) exposed. There is no tunneling or undermining noted. There is a none present amount of drainage noted. The wound margin is flat and intact. There is large (67-100%) red granulation within the wound bed. There is no necrotic tissue within the wound bed. General Notes: Dressing stuck to wound and wound bed. 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 ([id number]) Procedures Wound #1 Pre-procedure diagnosis  of Wound #1 is a Pressure Ulcer located on the Right,Lateral Foot . There was a Excisional Skin/Subcutaneous Tissue Debridement with a total area of 2.4 sq cm performed by Tommie Sams., PA-C. With the following instrument(s): Curette to remove Viable and Non-Viable tissue/material. Material removed includes Subcutaneous Tissue, Slough, Skin: Dermis, and Skin: Epidermis. No specimens were taken. A time out was conducted at 11:34, prior to the start of the procedure. A Minimum amount of bleeding was controlled with Pressure. The procedure was tolerated well. Post Debridement Measurements: 2cm length x 1.2cm width x 0.3cm depth; 0.565cm^3 volume. Post debridement Stage noted as Category/Stage IV. Character of Wound/Ulcer Post Debridement is stable. Post procedure  Diagnosis Wound #1: Same as Pre-Procedure General Notes: tendon exposed. Wound #5 Pre-procedure diagnosis of Wound #5 is a Diabetic Wound/Ulcer of the Lower Extremity located on the Right,Plantar Foot .Severity of Tissue Pre Debridement is: Limited to breakdown of skin. There was a Selective/Open Wound Skin/Epidermis Debridement with a total area of 0.09 sq cm performed by Tommie Sams., PA-C. With the following instrument(s): Curette to remove Viable and Non-Viable tissue/material. Material removed includes Skin: Dermis and Skin: Epidermis and. No specimens were taken. A time out was conducted at 11:34, prior to the start of the procedure. A Minimum amount of bleeding was controlled with Pressure. The procedure was tolerated well. Post Debridement Measurements: 0.3cm length x 0.3cm width x 3cm depth; 0.212cm^3 volume. Character of Wound/Ulcer Post Debridement is stable. Severity of Tissue Post Debridement is: Fat layer exposed. Post procedure Diagnosis Wound #5: Same as Pre-Procedure Wound #6 Pre-procedure diagnosis of Wound #6 is a Diabetic Wound/Ulcer of the Lower Extremity located on the Right,Proximal,Anterior Lower Leg .Severity of  Tissue Pre Debridement is: Fat layer exposed. There was a Selective/Open Wound Skin/Epidermis Debridement with a total area of 0.4 sq cm performed by Tommie Sams., PA-C. With the following instrument(s): Curette to remove Viable and Non-Viable tissue/material. Material removed includes Skin: Dermis, Skin: Epidermis, Fibrin/Exudate, and Other: adheared dressing after achieving pain control using Lidocaine. No specimens were taken. A time out was conducted at 11:34, prior to the start of the procedure. A Minimum amount of bleeding was controlled with Pressure. The procedure was tolerated well. Post Debridement Measurements: 1cm length x 0.4cm width x 0.1cm depth; 0.031cm^3 volume. Character of Wound/Ulcer Post Debridement is stable. Severity of Tissue Post Debridement is: Limited to breakdown of skin. Post procedure Diagnosis Wound #6: Same as Pre-Procedure Plan Wound Cleansing: Wound #1 Right,Lateral Foot: Clean wound with Normal Saline. Antibacterial soap, wash wounds, rinse and pat dry prior to dressing wounds Wound #2 Right,Dorsal Foot: Clean wound with Normal Saline. Antibacterial soap, wash wounds, rinse and pat dry prior to dressing wounds Wound #5 Right,Plantar Foot: Clean wound with Normal Saline. Antibacterial soap, wash wounds, rinse and pat dry prior to dressing wounds Wound #6 Right,Proximal,Anterior Lower Leg: Clean wound with Normal Saline. Antibacterial soap, wash wounds, rinse and pat dry prior to dressing wounds Anesthetic (add to Medication List): Wound #1 Right,Lateral Foot: Topical Lidocaine 4% cream applied to wound bed prior to debridement (In Clinic Only). Wound #2 Right,Dorsal Foot: Topical Lidocaine 4% cream applied to wound bed prior to debridement (In Clinic Only). Wound #5 Right,Plantar Foot: Topical Lidocaine 4% cream applied to wound bed prior to debridement (In Clinic Only). Wound #6 Right,Proximal,Anterior Lower Leg: Topical Lidocaine 4% cream applied to  wound bed prior to debridement (In Clinic Only). Primary Wound Dressing: Wound #1 Right,Lateral Foot: Santyl Ointment Wound #5 Right,Plantar Foot: Hydrafera Blue Ready Transfer - rope packed gently into wound Wound #6 Right,Proximal,Anterior Lower Leg: Xeroform gauze Wound #2 Right,Dorsal Foot: Silver Collagen Secondary Dressing: Wound #2 Right,Dorsal Foot: Boardered Foam Dressing Wyly, Suhaib S. ([id number]) Wound #1 Right,Lateral Foot: ABD and Kerlix/Conform Wound #5 Right,Plantar Foot: ABD and Kerlix/Conform Wound #6 Right,Proximal,Anterior Lower Leg: ABD and Kerlix/Conform Dressing Change Frequency: Wound #1 Right,Lateral Foot: Change dressing every day. Wound #5 Right,Plantar Foot: Change dressing every day. Wound #2 Right,Dorsal Foot: Three times weekly Wound #6 Right,Proximal,Anterior Lower Leg: Three times weekly Follow-up Appointments: Wound #1 Right,Lateral Foot: Return Appointment in 1 week. Wound #2 Right,Dorsal Foot: Return Appointment in 1 week. Wound #5 Right,Plantar Foot: Return  Appointment in 1 week. Wound #6 Right,Proximal,Anterior Lower Leg: Return Appointment in 1 week. Edema Control: Wound #1 Right,Lateral Foot: Elevate legs to the level of the heart and pump ankles as often as possible - Tubigrip E Wound #2 Right,Dorsal Foot: Elevate legs to the level of the heart and pump ankles as often as possible - Tubigrip E Wound #5 Right,Plantar Foot: Elevate legs to the level of the heart and pump ankles as often as possible - Tubigrip E Wound #6 Right,Proximal,Anterior Lower Leg: Elevate legs to the level of the heart and pump ankles as often as possible - Tubigrip E Off-Loading: Wound #1 Right,Lateral Foot: Other: - front off-loader right Wound #2 Right,Dorsal Foot: Other: - front off-loader right Wound #5 Right,Plantar Foot: Other: - front off-loader right Wound #6 Right,Proximal,Anterior Lower Leg: Other: - front off-loader  right Additional Orders / Instructions: Wound #1 Right,Lateral Foot: Increase protein intake. Wound #2 Right,Dorsal Foot: Increase protein intake. Wound #5 Right,Plantar Foot: Increase protein intake. Wound #6 Right,Proximal,Anterior Lower Leg: Increase protein intake. 1. Would get a switch to a silver collagen for the dorsal surface of the patient's foot as that seems to be doing well. 2. I am also can recommend at this time that we have the patient continue with the [facility] for the lateral foot that seems to be doing well. 3. Were also going to switch on the leg to Xeroform gauze to cover this as I feel like it is just getting dry and stuck and that is causing problem with the collagen. 4. We did attempt to put the snap VAC on the plantar foot wound. Unfortunately we were unable to gain a seal with this ended up having to remove it in the end and just use a standard dressing. We will therefore continue with packing the Hydrofera Blue rope into this area I think that may be helpful and will see how things look and appear next week. We will see patient back for reevaluation in 1 week here in the clinic. If anything worsens or changes patient will contact our office for additional recommendations. Electronic Signature(s) Signed: 06/27/2020 4:28:15 PM By: Worthy Keeler PA-C Previous Signature: 06/27/2020 4:26:00 PM Version By: Worthy Keeler PA-C Entered By: Worthy Keeler on 06/27/2020 16:28:14 Ralph Peterson ([id number]) -------------------------------------------------------------------------------- SuperBill Details Patient Name: Ralph Peterson Date of Service: 06/27/2020 Medical Record Number: [id number] Patient Account Number: [id number] Date of Birth/Sex: 1965-09-01 (54 y.o. M) Treating RN: Cornell Barman Primary Care Provider: Vidal Schwalbe Other Clinician: Referring Provider: Vidal Schwalbe Treating Provider/Extender: Skipper Cliche in Treatment: 8 Diagnosis  Coding ICD-10 Codes Code Description [phone] Pressure ulcer of other site, stage 3 T81.31XA Disruption of external operation (surgical) wound, not elsewhere classified, initial encounter 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d L97.812 Non-pressure chronic ulcer of other part of right lower leg with fat layer exposed E11.621 Type 2 diabetes mellitus with foot ulcer I50.42 Chronic combined systolic (congestive) and diastolic (congestive) heart failure N18.6 End stage renal disease Z99.2 Dependence on renal dialysis Facility Procedures CPT4 Code: 28315176 Description: 11042 - DEB SUBQ TISSUE 20 SQ CM/< Modifier: Quantity: 1 CPT4 Code: Description: ICD-10 Diagnosis Description 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s Modifier: ed Quantity: CPT4 Code: 16073710 Description: 62694 - DEBRIDE WOUND 1ST 20 SQ CM OR < Modifier: Quantity: 1 CPT4 Code: Description: ICD-10 Diagnosis Description L97.812 Non-pressure chronic ulcer of other part of right lower leg  with fat layer Modifier: exposed Quantity: Physician Procedures CPT4 Code: 9924268 Description: 34196 - WC PHYS SUBQ TISS 20 SQ CM Modifier: Quantity: 1 CPT4 Code: Description: ICD-10 Diagnosis Description Q22.979 Non-pressure chronic ulcer of other part of right foot with fat layer expos Modifier: ed Quantity: CPT4 Code: 8921194 Description: 17408 - WC PHYS DEBR WO ANESTH 20 SQ CM Modifier: Quantity: 1 CPT4 Code: Description: ICD-10 Diagnosis Description X44.818 Non-pressure chronic ulcer of other part of right lower leg with fat layer Modifier: exposed Quantity: Electronic Signature(s) Signed: 06/27/2020 4:28:29 PM By: Worthy Keeler PA-C Entered By: Worthy Keeler on 06/27/2020 56:31:49

## 2020-06-27 NOTE — Progress Notes (Addendum)
ISHAQ, MAFFEI ([id number]) Visit Report for 06/27/2020 Arrival Information Details Patient Name: Ralph Peterson, Ralph Peterson. Date of Service: 06/27/2020 11:00 AM Medical Record Number: [id number] Patient Account Number: [id number] Date of Birth/Sex: 1965-12-05 (54 y.o. M) Treating RN: Cornell Barman Primary Care Adaliah Hiegel: Vidal Schwalbe Other Clinician: Referring Trevar Boehringer: Vidal Schwalbe Treating Esaias Cleavenger/Extender: Skipper Cliche in Treatment: 8 Visit Information History Since Last Visit Added or deleted any medications: No Patient Arrived: Wheel Chair Has Dressing in Place as Prescribed: Yes Arrival Time: 11:06 Has Footwear/Offloading in Place as Prescribed: Yes Accompanied By: family Right: Wedge Shoe Transfer Assistance: None Pain Present Now: No Patient Identification Verified: Yes Secondary Verification Process Completed: Yes Patient Requires Transmission-Based Precautions: No Patient Has Alerts: No Electronic Signature(s) Signed: 06/27/2020 5:20:48 PM By: Gretta Cool, BSN, RN, CWS, Kim RN, BSN Entered By: Gretta Cool, BSN, RN, CWS, Kim on 06/27/2020 11:07:20 Ralph Peterson ([id number]) -------------------------------------------------------------------------------- Encounter Discharge Information Details Patient Name: Ralph Peterson. Date of Service: 06/27/2020 11:00 AM Medical Record Number: [id number] Patient Account Number: [id number] Date of Birth/Sex: 1966/04/16 (54 y.o. M) Treating RN: Cornell Barman Primary Care Lenka Zhao: Vidal Schwalbe Other Clinician: Referring Eilah Common: Vidal Schwalbe Treating Keiston Manley/Extender: Skipper Cliche in Treatment: 8 Encounter Discharge Information Items Post Procedure Vitals Discharge Condition: Stable Unable to obtain vitals Reason: . Ambulatory Status: Wheelchair Discharge Destination: Home Transportation: Private Auto Accompanied By: self Schedule Follow-up Appointment: Yes Clinical Summary of Care: Electronic  Signature(s) Signed: 06/27/2020 1:03:38 PM By: Gretta Cool, BSN, RN, CWS, Kim RN, BSN Entered By: Gretta Cool, BSN, RN, CWS, Kim on 06/27/2020 13:03:38 Ralph Peterson ([id number]) -------------------------------------------------------------------------------- Lower Extremity Assessment Details Patient Name: Ralph Peterson. Date of Service: 06/27/2020 11:00 AM Medical Record Number: [id number] Patient Account Number: [id number] Date of Birth/Sex: [birth year] (54 y.o. M) Treating RN: Cornell Barman Primary Care Neko Boyajian: Vidal Schwalbe Other Clinician: Referring Geana Walts: Vidal Schwalbe Treating Ardell Aaronson/Extender: Skipper Cliche in Treatment: 8 Vascular Assessment Pulses: Dorsalis Pedis Palpable: [Right:Yes] Posterior Tibial Palpable: [Right:Yes] Electronic Signature(s) Signed: 06/27/2020 5:20:48 PM By: Gretta Cool, BSN, RN, CWS, Kim RN, BSN Entered By: Gretta Cool, BSN, RN, CWS, Kim on 06/27/2020 11:24:45 Ralph Peterson ([id number]) -------------------------------------------------------------------------------- Multi Wound Chart Details Patient Name: Ralph Peterson Date of Service: 06/27/2020 11:00 AM Medical Record Number: [id number] Patient Account Number: [id number] Date of Birth/Sex: [birth year] (54 y.o. 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 Comorbid History: Arrhythmia, Congestive Heart Arrhythmia, Congestive Heart  Arrhythmia, Congestive Heart Failure, Coronary Artery Disease, Failure, Coronary Artery Disease, Failure, Coronary Artery Disease, Hypertension, Myocardial Infarction, Hypertension, Myocardial Infarction, Hypertension, Myocardial Infarction, Peripheral Venous Disease, Type II Peripheral Venous Disease, Type II Peripheral Venous Disease, Type II Diabetes, Neuropathy Diabetes, Neuropathy Diabetes, Neuropathy Date Acquired: 02/20/2020 04/20/2020 03/24/2020 Weeks of Treatment: 8 8 5  Wound Status: Open Open Open Measurements L x W x D (cm) 2x1.2x0.2 3x0.7x0.4 0.1x0.1x3 Area (cm) : 1.885 1.649 0.008 Volume (cm) : 0.377 0.66 0.024 % Reduction in Area: 62.70% 65.00% 98.80% % Reduction in Volume: 85.10% 53.30% 92.80% Classification: Category/Stage IV Full Thickness Without Exposed Grade 1 Support Structures Exudate Amount: Medium Large Medium Exudate Type: Serosanguineous Serosanguineous Serous Exudate Color:  red, brown red, brown amber Wound Margin: Distinct, outline attached Distinct, outline attached Distinct, outline attached Granulation Amount: Medium (34-66%) Large (67-100%) None Present (0%) Granulation Quality: Pale Red N/A Necrotic Amount: Medium (34-66%) None Present (0%) None Present (0%) Exposed Structures: Fat Layer (Subcutaneous Tissue): Fat Layer (Subcutaneous Tissue): Fascia: No Yes Yes Fat Layer (Subcutaneous Tissue): Tendon: Yes Fascia: No No Joint: Yes Tendon: No Tendon: No Fascia: No Muscle: No Muscle: No Muscle: No Joint: No Joint: No Bone: No Bone: No Bone: No Epithelialization: None Small (1-33%) None Assessment Notes: N/A N/A Unable to visualize wound bed at this time due to wound size and depth. Wound Number: 6 N/A N/A Photos: N/A N/A Ralph Peterson ([id number]) Wound Location: Right, Proximal, Anterior Lower Leg N/A N/A Wounding Event: Gradually Appeared N/A N/A Primary Etiology: Diabetic Wound/Ulcer of the Lower N/A N/A Extremity Comorbid  History: Arrhythmia, Congestive Heart N/A N/A Failure, Coronary Artery Disease, Hypertension, Myocardial Infarction, Peripheral Venous Disease, Type II Diabetes, Neuropathy Date Acquired: 06/02/2020 N/A N/A Weeks of Treatment: 2 N/A N/A Wound Status: Open N/A N/A Measurements L x W x D (cm) 0.8x0.3x0.1 N/A N/A Area (cm) : 0.188 N/A N/A Volume (cm) : 0.019 N/A N/A % Reduction in Area: 98.50% N/A N/A % Reduction in Volume: 98.50% N/A N/A Classification: Grade 1 N/A N/A Exudate Amount: None Present N/A N/A Exudate Type: N/A N/A N/A Exudate Color: N/A N/A N/A Wound Margin: Flat and Intact N/A N/A Granulation Amount: Large (67-100%) N/A N/A Granulation Quality: Red N/A N/A Necrotic Amount: None Present (0%) N/A N/A Exposed Structures: Fat Layer (Subcutaneous Tissue): N/A N/A Yes Fascia: No Tendon: No Muscle: No Joint: No Bone: No Epithelialization: Medium (34-66%) N/A N/A Assessment Notes: Dressing stuck to wound and wound N/A N/A bed. Treatment Notes Electronic Signature(s) Signed: 06/27/2020 5:20:48 PM By: Gretta Cool, BSN, RN, CWS, Kim RN, BSN Entered By: Gretta Cool, BSN, RN, CWS, Kim on 06/27/2020 11:33:56 Ralph Peterson ([id number]) -------------------------------------------------------------------------------- Multi-Disciplinary Care Plan Details Patient Name: TYREK, LAWHORN. Date of Service: 06/27/2020 11:00 AM Medical Record Number: [id number] Patient Account Number: [id number] Date of Birth/Sex: Feb 13, 1966 (54 y.o. M) Treating RN: Cornell Barman Primary Care Nechelle Petrizzo: Vidal Schwalbe Other Clinician: Referring Delando Satter: Vidal Schwalbe Treating Jonessa Triplett/Extender: Skipper Cliche in Treatment: 8 Active Inactive Necrotic Tissue Nursing Diagnoses: Impaired tissue integrity related to necrotic/devitalized tissue Knowledge deficit related to management of necrotic/devitalized tissue Goals: Necrotic/devitalized tissue will be minimized in the wound bed Date  Initiated: 05/09/2020 Target Resolution Date: 05/09/2020 Goal Status: Active Interventions: Assess patient pain level pre-, during and post procedure and prior to discharge Provide education on necrotic tissue and debridement process Treatment Activities: Enzymatic debridement : 05/09/2020 Notes: Orientation to the Wound Care Program Nursing Diagnoses: Knowledge deficit related to the wound healing center program Goals: Patient/caregiver will verbalize understanding of the [facility] Date Initiated: 04/27/2020 Target Resolution Date: 05/18/2020 Goal Status: Active Interventions: Provide education on orientation to the wound center Notes: Wound/Skin Impairment Nursing Diagnoses: Impaired tissue integrity Knowledge deficit related to ulceration/compromised skin integrity Goals: Patient/caregiver will verbalize understanding of skin care regimen Date Initiated: 04/27/2020 Target Resolution Date: 05/25/2020 Goal Status: Active Interventions: Assess patient/caregiver ability to obtain necessary supplies Assess patient/caregiver ability to perform ulcer/skin care regimen upon admission and as needed Assess ulceration(s) every visit Provide education on ulcer and skin care Treatment Activities: Skin care regimen initiated : 04/27/2020 CLARE, CASTO ([id number]) Topical wound management initiated : 04/27/2020 Notes: Electronic Signature(s) Signed: 06/27/2020 5:20:48 PM By: Gretta Cool, BSN, RN, CWS,  Maudie Mercury RN, BSN Entered By: Gretta Cool, BSN, RN, CWS, Kim on 06/27/2020 11:33:47 Ralph Peterson ([id number]) -------------------------------------------------------------------------------- Pain Assessment Details Patient Name: Ralph Peterson Date of Service: 06/27/2020 11:00 AM Medical Record Number: [id number] Patient Account Number: [id number] Date of Birth/Sex: [birth year] (54 y.o. M) Treating RN: Cornell Barman Primary Care Babe Clenney: Vidal Schwalbe Other  Clinician: Referring Miangel Flom: Vidal Schwalbe Treating Querida Beretta/Extender: Skipper Cliche in Treatment: 8 Active Problems Location of Pain Severity and Description of Pain Patient Has Paino No Site Locations Pain Management and Medication Current Pain Management: Notes Patient denies pain at this time. Electronic Signature(s) Signed: 06/27/2020 5:20:48 PM By: Gretta Cool, BSN, RN, CWS, Kim RN, BSN Entered By: Gretta Cool, BSN, RN, CWS, Kim on 06/27/2020 11:08:23 Ralph Peterson ([id number]) -------------------------------------------------------------------------------- Patient/Caregiver Education Details Patient Name: Ralph Peterson Date of Service: 06/27/2020 11:00 AM Medical Record Number: [id number] Patient Account Number: [id number] Date of Birth/Gender: 1965-09-17 (54 y.o. M) Treating RN: Cornell Barman Primary Care Physician: Vidal Schwalbe Other Clinician: Referring Physician: Vidal Schwalbe Treating Physician/Extender: Skipper Cliche in Treatment: 8 Education Assessment Education Provided To: Patient Education Topics Provided Wound Debridement: Handouts: Wound Debridement Methods: Demonstration, Explain/Verbal Responses: State content correctly Wound/Skin Impairment: Handouts: Caring for Your Ulcer Methods: Demonstration, Explain/Verbal Responses: State content correctly Electronic Signature(s) Signed: 06/27/2020 5:20:48 PM By: Gretta Cool, BSN, RN, CWS, Kim RN, BSN Entered By: Gretta Cool, BSN, RN, CWS, Kim on 06/27/2020 13:01:31 Ralph Peterson ([id number]) -------------------------------------------------------------------------------- Wound Assessment Details Patient Name: Ralph Peterson. Date of Service: 06/27/2020 11:00 AM Medical Record Number: [id number] Patient Account Number: [id number] Date of Birth/Sex: January 16, 1966 (54 y.o. M) Treating RN: Cornell Barman Primary Care Verlinda Slotnick: Vidal Schwalbe Other Clinician: Referring Tilia Faso: Vidal Schwalbe Treating  Eliah Marquard/Extender: Skipper Cliche in Treatment: 8 Wound Status Wound Number: 1 Primary Pressure Ulcer Etiology: Wound Location: Right, Lateral Foot Wound Open Wounding Event: Pressure Injury Status: Date Acquired: 02/20/2020 Comorbid Arrhythmia, Congestive Heart Failure, Coronary Artery Weeks Of Treatment: 8 History: Disease, Hypertension, Myocardial Infarction, Peripheral Clustered Wound: No Venous Disease, Type II Diabetes, Neuropathy Photos Wound Measurements Length: (cm) 2 Width: (cm) 1.2 Depth: (cm) 0.2 Area: (cm) 1.885 Volume: (cm) 0.377 % Reduction in Area: 62.7% % Reduction in Volume: 85.1% Epithelialization: None Tunneling: No Undermining: No Wound Description Classification: Category/Stage IV Wound Margin: Distinct, outline attached Exudate Amount: Medium Exudate Type: Serosanguineous Exudate Color: red, brown Foul Odor After Cleansing: No Slough/Fibrino No Wound Bed Granulation Amount: Medium (34-66%) Exposed Structure Granulation Quality: Pale Fascia Exposed: No Necrotic Amount: Medium (34-66%) Fat Layer (Subcutaneous Tissue) Exposed: Yes Tendon Exposed: Yes Muscle Exposed: No Joint Exposed: Yes Bone Exposed: No Treatment Notes Wound #1 (Right, Lateral Foot) Notes Right lateral-santyl; Right dorsal-collagen; Right leg-xeroform; Right plantar: hydrofera blue rope Electronic Signature(s) ZEBULIN, SIEGEL ([id number]) Signed: 06/27/2020 5:20:48 PM By: Gretta Cool, BSN, RN, CWS, Kim RN, BSN Entered By: Gretta Cool, BSN, RN, CWS, Kim on 06/27/2020 11:17:40 Ralph Peterson ([id number]) -------------------------------------------------------------------------------- Wound Assessment Details Patient Name: Ralph Peterson. Date of Service: 06/27/2020 11:00 AM Medical Record Number: [id number] Patient Account Number: [id number] Date of Birth/Sex: 05/28/1966 (54 y.o. M) Treating RN: Cornell Barman Primary Care Ayomide Purdy: Vidal Schwalbe Other  Clinician: Referring Giulio Bertino: Vidal Schwalbe Treating Ediel Unangst/Extender: Skipper Cliche in Treatment: 8 Wound Status Wound Number: 2 Primary Open Surgical Wound Etiology: Wound Location: Right, Dorsal Foot Wound Open Wounding Event: Surgical Injury Status: Date Acquired: 04/20/2020 Comorbid Arrhythmia, Congestive Heart Failure, Coronary Artery Weeks Of Treatment: 8 History: Disease, Hypertension, Myocardial Infarction, Peripheral Clustered Wound: No Venous Disease, Type II  Diabetes, Neuropathy Photos Wound Measurements Length: (cm) 3 Width: (cm) 0.7 Depth: (cm) 0.4 Area: (cm) 1.649 Volume: (cm) 0.66 % Reduction in Area: 65% % Reduction in Volume: 53.3% Epithelialization: Small (1-33%) Tunneling: No Undermining: No Wound Description Classification: Full Thickness Without Exposed Support Structures Wound Margin: Distinct, outline attached Exudate Amount: Large Exudate Type: Serosanguineous Exudate Color: red, brown Foul Odor After Cleansing: No Slough/Fibrino Yes Wound Bed Granulation Amount: Large (67-100%) Exposed Structure Granulation Quality: Red Fascia Exposed: No Necrotic Amount: None Present (0%) Fat Layer (Subcutaneous Tissue) Exposed: Yes Tendon Exposed: No Muscle Exposed: No Joint Exposed: No Bone Exposed: No Treatment Notes Wound #2 (Right, Dorsal Foot) Notes Right lateral-santyl; Right dorsal-collagen; Right leg-xeroform; Right plantar: hydrofera blue rope Electronic Signature(s) FERNAND, SORBELLO ([id number]) Signed: 06/27/2020 5:20:48 PM By: Gretta Cool, BSN, RN, CWS, Kim RN, BSN Entered By: Gretta Cool, BSN, RN, CWS, Kim on 06/27/2020 11:19:44 Ralph Peterson ([id number]) -------------------------------------------------------------------------------- Wound Assessment Details Patient Name: Ralph Peterson. Date of Service: 06/27/2020 11:00 AM Medical Record Number: [id number] Patient Account Number: [id number] Date of Birth/Sex:  Oct 16, 1965 (54 y.o. M) Treating RN: Cornell Barman Primary Care Shiane Wenberg: Vidal Schwalbe Other Clinician: Referring Shion Bluestein: Vidal Schwalbe Treating Trinidad Ingle/Extender: Skipper Cliche in Treatment: 8 Wound Status Wound Number: 5 Primary Diabetic Wound/Ulcer of the Lower Extremity Etiology: Wound Location: Right, Plantar Foot Wound Open Wounding Event: Surgical Injury Status: Date Acquired: 03/24/2020 Comorbid Arrhythmia, Congestive Heart Failure, Coronary Artery Weeks Of Treatment: 5 History: Disease, Hypertension, Myocardial Infarction, Peripheral Clustered Wound: No Venous Disease, Type II Diabetes, Neuropathy Photos Wound Measurements Length: (cm) 0.1 Width: (cm) 0.1 Depth: (cm) 3 Area: (cm) 0.008 Volume: (cm) 0.024 % Reduction in Area: 98.8% % Reduction in Volume: 92.8% Epithelialization: None Tunneling: No Undermining: No Wound Description Classification: Grade 1 Wound Margin: Distinct, outline attached Exudate Amount: Medium Exudate Type: Serous Exudate Color: amber Foul Odor After Cleansing: No Slough/Fibrino Yes Wound Bed Granulation Amount: None Present (0%) Exposed Structure Necrotic Amount: None Present (0%) Fascia Exposed: No Fat Layer (Subcutaneous Tissue) Exposed: No Tendon Exposed: No Muscle Exposed: No Joint Exposed: No Bone Exposed: No Assessment Notes Unable to visualize wound bed at this time due to wound size and depth. Treatment Notes Wound #5 (Right, Plantar Foot) Notes BRYDAN, DOWNARD ([id number]) Right lateral-santyl; Right dorsal-collagen; Right leg-xeroform; Right plantar: hydrofera blue rope Electronic Signature(s) Signed: 06/27/2020 5:20:48 PM By: Gretta Cool, BSN, RN, CWS, Kim RN, BSN Entered By: Gretta Cool, BSN, RN, CWS, Kim on 06/27/2020 11:22:03 Ralph Peterson ([id number]) -------------------------------------------------------------------------------- Wound Assessment Details Patient Name: Ralph Peterson. Date of  Service: 06/27/2020 11:00 AM Medical Record Number: [id number] Patient Account Number: [id number] Date of Birth/Sex: January 28, 1966 (54 y.o. M) Treating RN: Cornell Barman Primary Care Galina Haddox: Vidal Schwalbe Other Clinician: Referring Feliberto Stockley: Vidal Schwalbe Treating Edita Weyenberg/Extender: Skipper Cliche in Treatment: 8 Wound Status Wound Number: 6 Primary Diabetic Wound/Ulcer of the Lower Extremity Etiology: Wound Location: Right, Proximal, Anterior Lower Leg Wound Open Wounding Event: Gradually Appeared Status: Date Acquired: 06/02/2020 Comorbid Arrhythmia, Congestive Heart Failure, Coronary Artery Weeks Of Treatment: 2 History: Disease, Hypertension, Myocardial Infarction, Peripheral Clustered Wound: No Venous Disease, Type II Diabetes, Neuropathy Photos Wound Measurements Length: (cm) 0.8 Width: (cm) 0.3 Depth: (cm) 0.1 Area: (cm) 0.188 Volume: (cm) 0.019 % Reduction in Area: 98.5% % Reduction in Volume: 98.5% Epithelialization: Medium (34-66%) Tunneling: No Undermining: No Wound Description Classification: Grade 1 Wound Margin: Flat and Intact Exudate Amount: None Present Foul Odor After Cleansing: No Slough/Fibrino No Wound Bed Granulation Amount: Large (67-100%) Exposed Structure  Granulation Quality: Red Fascia Exposed: No Necrotic Amount: None Present (0%) Fat Layer (Subcutaneous Tissue) Exposed: Yes Tendon Exposed: No Muscle Exposed: No Joint Exposed: No Bone Exposed: No Assessment Notes Dressing stuck to wound and wound bed. Treatment Notes Wound #6 (Right, Proximal, Anterior Lower Leg) Notes Right lateral-santyl; Right dorsal-collagen; Right leg-xeroform; Right plantar: hydrofera blue rope Ezel, SHELLHAMMER ([id number]) Electronic Signature(s) Signed: 06/27/2020 5:20:48 PM By: Gretta Cool, BSN, RN, CWS, Kim RN, BSN Entered By: Gretta Cool, BSN, RN, CWS, Kim on 06/27/2020 11:24:06 Ralph Peterson  ([id number]) -------------------------------------------------------------------------------- Vitals Details Patient Name: Ralph Peterson. Date of Service: 06/27/2020 11:00 AM Medical Record Number: [id number] Patient Account Number: [id number] Date of Birth/Sex: 05-19-1966 (54 y.o. M) Treating RN: Cornell Barman Primary Care Demetra Moya: Vidal Schwalbe Other Clinician: Referring Charma Mocarski: Vidal Schwalbe Treating Merriel Zinger/Extender: Skipper Cliche in Treatment: 8 Vital Signs Time Taken: 11:07 Temperature (F): 97.7 Height (in): 72 Pulse (bpm): 78 Weight (lbs): 225 Respiratory Rate (breaths/min): 18 Body Mass Index (BMI): 30.5 Blood Pressure (mmHg): 132/86 Reference Range: 80 - 120 mg / dl Electronic Signature(s) Signed: 06/27/2020 5:20:48 PM By: Gretta Cool, BSN, RN, CWS, Kim RN, BSN Entered By: Gretta Cool, BSN, RN, CWS, Kim on 06/27/2020 11:07:57

## 2020-07-06 ENCOUNTER — Telehealth: Payer: Self-pay | Admitting: Family

## 2020-07-06 ENCOUNTER — Other Ambulatory Visit: Payer: Self-pay | Admitting: Specialist

## 2020-07-06 ENCOUNTER — Encounter: Payer: Self-pay | Admitting: Podiatry

## 2020-07-06 ENCOUNTER — Ambulatory Visit (INDEPENDENT_AMBULATORY_CARE_PROVIDER_SITE_OTHER): Payer: Medicare PPO | Admitting: Podiatry

## 2020-07-06 ENCOUNTER — Other Ambulatory Visit: Payer: Self-pay

## 2020-07-06 ENCOUNTER — Ambulatory Visit: Payer: Medicare PPO | Admitting: Family

## 2020-07-06 DIAGNOSIS — R0602 Shortness of breath: Secondary | ICD-10-CM

## 2020-07-06 DIAGNOSIS — Z89421 Acquired absence of other right toe(s): Secondary | ICD-10-CM | POA: Diagnosis not present

## 2020-07-06 DIAGNOSIS — L97512 Non-pressure chronic ulcer of other part of right foot with fat layer exposed: Secondary | ICD-10-CM

## 2020-07-06 NOTE — Progress Notes (Signed)
Subjective:  Patient ID: Ralph Peterson, male    DOB: [birth year],  MRN: [id number]  Chief Complaint  Patient presents with  . Routine Post Op    DOS 04/10/2020 RT SECOND METATARSAL/RAY AMPUTATION    54 y.o. male returns for post-op check.  Patient is doing well.  He has been keeping the bandages clean dry and intact.  Overall no acute complaints no clinical signs of infection.  He has been going to the wound care center for wound management.  Pain is well controlled.  Review of Systems: Negative except as noted in the HPI. Denies N/V/F/Ch.  Past Medical History:  Diagnosis Date  . Anxiety   . Arrhythmia    atrial fibrillation  . CAD (coronary artery disease)    a. 09/2010 Cath/PCI ([facility]): LM nl, LAD 82m, D1 80 (small), LCX 59m, OM1 30, RI 70 (small), RCA 70 (DES).  . CHF (congestive heart failure) ([place])   . Coronary artery disease   . COVID-19 virus infection 07/2019  . Diabetes mellitus without complication ([place])   . ESRD (end stage renal disease) ([place])   . Hyperlipidemia   . Hypertension   . Ischemic cardiomyopathy    a.  12/2011 Echo ([facility]): NL EF, mod LVH. Mild AS/MS, triv PR/TR. 07/2019 Echo: EF 25-30%, GR1 DD, inf/post HK, low nl RV fxn, mildly dil RA, triv MR, mild Ao sclerosis w/o stenosis.  . NSTEMI (non-ST elevated myocardial infarction) ([place]) 07/2019  . PAD (peripheral artery disease) ([place])    a. 03/2016 s/p PTA/DEB R SFA/popliteal/peroneal; b. 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 08/2017 Atherectomy/DEB dist L SFA/popliteal. PTA of L AT; d. 06/2018 PTA/DEB L SFA/popliteal/PT/AT; e. 01/2019 Stable ABIs.  . Sleep apnea     Current Outpatient Medications:  .  amiodarone (PACERONE) 200 MG tablet, Take 200 mg by mouth 2 (two) times daily., Disp: , Rfl:  .  apixaban (ELIQUIS) 5 MG TABS tablet, Take 1 tablet (5 mg total) by mouth 2 (two) times daily., Disp: 60 tablet, Rfl: 3 .  atorvastatin (LIPITOR) 40 MG tablet, Take 40 mg by mouth every evening. , Disp: , Rfl: 1 .   cinacalcet (SENSIPAR) 30 MG tablet, Take 30 mg by mouth daily., Disp: , Rfl:  .  escitalopram (LEXAPRO) 20 MG tablet, Take 20 mg by mouth daily. , Disp: , Rfl:  .  famotidine (PEPCID) 20 MG tablet, Take 20 mg by mouth daily., Disp: , Rfl:  .  glipiZIDE (GLUCOTROL) 5 MG tablet, Take 5 mg by mouth daily before breakfast. , Disp: , Rfl:  .  insulin detemir (LEVEMIR) 100 UNIT/ML injection, Inject 0.05 mLs (5 Units total) into the skin 2 (two) times daily., Disp: 10 mL, Rfl: 3 .  levofloxacin (LEVAQUIN) 500 MG tablet, , Disp: , Rfl:  .  loperamide (IMODIUM) 2 MG capsule, Take 2 mg by mouth as needed for diarrhea or loose stools., Disp: , Rfl:  .  metoprolol succinate (TOPROL-XL) 50 MG 24 hr tablet, , Disp: , Rfl:  .  montelukast (SINGULAIR) 10 MG tablet, Take 10 mg by mouth daily., Disp: , Rfl:  .  SANTYL ointment, , Disp: , Rfl:  .  sevelamer carbonate (RENVELA) 800 MG tablet, Take 1,600-3,200 mg by mouth See admin instructions. Take 4 tablets (3200mg ) three times daily with meals and take 1 tablet (800mg ) by mouth with snacks, Disp: , Rfl:   Social History   Tobacco Use  Smoking Status Former Smoker  . Types: Cigarettes  . Quit  date: 2010  . Years since quitting: 11.9  Smokeless Tobacco Never Used    Allergies  Allergen Reactions  . Oxycodone-Acetaminophen Itching  . Gabapentin Other (See Comments)  . Tramadol Other (See Comments)  . Penicillins Rash    Has patient had a PCN reaction causing immediate rash, facial/tongue/throat swelling, SOB or lightheadedness with hypotension: Yes Has patient had a PCN reaction causing severe rash involving mucus membranes or skin necrosis: Yes Has patient had a PCN reaction that required hospitalization: No Has patient had a PCN reaction occurring within the last 10 years: No If all of the above answers are "NO", then may proceed with Cephalosporin use.    Objective:   There were no vitals filed for this visit. There is no height or weight on  file to calculate BMI. Constitutional Well developed. Well nourished.  Vascular Foot warm and well perfused. Capillary refill normal to all digits.   Neurologic Normal speech. Oriented to person, place, and time. Epicritic sensation to light touch grossly present bilaterally.  Dermatologic Skin healing well without signs of infection. Skin edges well coapted without signs of infection.  Granular ulcer noted to the right lateral foot no exposure of bone.  Periwound is within normal limits.  No clinical signs of infection noted.  No purulent drainage noted.  Orthopedic: Tenderness to palpation noted about the surgical site.   Radiographs: 3 views of skeletally mature the right foot: Sharp surgical margins noted at the amputation site of the second ray.  No radiographic signs of osteomyelitis noted at this time.  However I am concerned for possible osteomyelitis to the right lateral fifth metatarsal head Assessment:   No diagnosis found. Plan:  Patient was evaluated and treated and all questions answered.  S/p foot surgery right -Progressing as expected post-operatively. -XR: See above -WB Status: Weightbearing as tolerated in [place] wedge shoe -Sutures: Removed.  Dehiscence is healing well.  Granular wound noted.  Being primarily managed at the wound care center -Medications: None -The foot was being dressed with silver alginate dressing as per wound care instructions.  I will continue to monitor the patient from periphery if there is regression especially of the right lateral foot ulcer patient may need to undergo transmetatarsal amputation.  Patient states understanding  Right lateral foot ulcer with fat layer exposed -Patient the etiology of ulceration versus treatment options were discussed.  I believe patient will benefit from aggressive debridement of the ulcer with application of Santyl.  Patient states understanding -After reviewing the radiographs I am worried that patient may  have possible osteomyelitis of the fifth metatarsal head.  Given that he has other ray amputation he is at high risk of undergoing transmetatarsal amputation at this time. -His wound is being managed by the wound care center at [facility].   No follow-ups on file.     No follow-ups on file.

## 2020-07-06 NOTE — Telephone Encounter (Signed)
Patient did not show for his [facility] appointment on 07/06/20. Will attempt to reschedule.

## 2020-07-07 ENCOUNTER — Encounter: Payer: Medicare PPO | Admitting: Physician Assistant

## 2020-07-07 DIAGNOSIS — L89893 Pressure ulcer of other site, stage 3: Secondary | ICD-10-CM | POA: Diagnosis not present

## 2020-07-07 NOTE — Progress Notes (Addendum)
Ralph Peterson ([id number]) Visit Report for 07/07/2020 Chief Complaint Document Details Patient Name: Ralph Peterson. Date of Service: 07/07/2020 2:30 PM Medical Record Number: [id number] Patient Account Number: [id number] Date of Birth/Sex: 1965-12-11 (54 y.o. 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 M) Treating RN: Cornell Barman Primary Care Provider: Vidal Schwalbe Other Clinician: Referring Provider: Vidal Schwalbe Treating Provider/Extender: Skipper Cliche in Treatment: 10 History of Present Illness HPI Description: He has been having with multiple ulcers of his feet. He does have a couple callus areas that have ulcerations underneath on the left foot. 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 Fortunately there is no sign of active infection at this time which is good news he has been using Telfa pads after applying Betadine this is not really absorbing enough of the drainage however which is causing the area especially at the amputation site to be very macerated. The patient states that he actually has a work-up with Dr. Delana Meyer at [facility] next Thursday. For that reason we did not perform ABIs today and again I really am not doing any significant debridement today we will discontinue mild things to remove some of the necrotic debris so that we hopefully get these wounds moving in the proper direction. With that being said his extremities do appear to be warm without any signs obviously of significant arterial flow. The patient does have a history of diabetes mellitus type 2, congestive heart failure, end- stage renal disease, and dependence on renal dialysis. He notes that he did have an exacerbation of heart failure recently which caused his legs to swell he had a couple wounds on his left leg but fortunately these have actually improved and are healed as of today. 05/09/2020 on evaluation today patient appears to be doing decently well all things considered in regard to his wounds. He does have the sutures out at this point in regard to his right amputation site. That does seem to be showing signs of improvement which is good news from the standpoint of at least being able to get some of the slough out although he does have quite a bit of slough buildup on the surface of the wound here. I think that is can require debridement before were able to see dramatic filling in. He may even require wound VAC at the site. With regard to the lateral foot this seems to be loosening up with the Santyl and I am pleased in that regard. In regard to the left foot everything appears to be healed and is doing great. 05/18/2020 on evaluation today patient appears to be doing well at this time in regard to his wounds. The wound on the lateral portion of his foot and the dorsal surface of his foot has been require sharp debridement of plantar aspect which was separated out from the  incision site from the ray amputation actually appears to be almost completely healed and is doing excellent. There is no signs of active infection at this  time. 05/26/2020 on evaluation today patient appears to be actually showing signs of improvement in general in regard to his wounds. The plantar aspect wound does connect to the dorsal surface wound which I initially expected but after last week was hopeful that that would not be the case. I was in fact hoping it would completely sealed up this week but it does not seem to have happened. There is no signs of active infection at this time which is great news. With that being said he does still have tender noted on the lateral aspect of the foot where the wound is there but this does not seem to be nearly as slough covered in fact I was able to carefully clean this off today and other than the fact that we need to help seal up the tendon I do not see any signs of infection at this point. 06/02/2020 upon evaluation today patient actually is making excellent improvement in regard to all of his wounds. In fact the plantar aspect of his foot looks like it may have sealed up although I cannot be 742% certain in this regard. With that being said I do see where there was some drainage on his dressing but today I cannot find any real depth to the wound opening and it appears to be close to closed if not completely. Nonetheless I do think that the dorsal surface of the foot is doing excellent in fact we have gotten approval for snap VAC but I am not even sure that skin to be necessary he is healing so well already. In regard to the lateral foot I do feel like the collagen has been beneficial here and my recommendation is good to be that we likely continue as such with this. Tenderness still exposed but seems to be nice and moist I see no issues in that regard. 06/09/2020 on evaluation today patient appears to be doing pretty well in regard to his foot ulcers although the plantar ulcer does appear to be deeper than what I was able to measure last week that is unfortunate. The lateral foot seems to be doing okay.  Fortunately there is no signs of active infection at this time. How are in regard to the blister on the lower leg on the right that opened last time this appears to be erythematous around and I feel like it is also warm to touch I am concerned about infection here. 06/13/2020 on evaluation today patient appears to be doing excellent in regard to his lateral foot wound is not measuring a lot smaller but it does look like it is fairly healthy at this time. His culture did show no signs of growth at this point which is good news. With that being said he has been taking the antibiotic and does feel like that has helped his leg is feeling better. This is good news. There are signs of new epithelial growth as well. With regard to his plantar foot this still shows signs of some depth its really slowly closing up and again I am not to concerned but and I cannot find where it directly connects to the area above but there may be some small connections. Either way I think that potentially the goal would be to try the snap VAC today to see if this could be beneficial I think it  may help to granulate in the upper wound and if we do that may be the plantar wound will show signs of improvement a little bit more sufficiently. 06/20/2020 upon evaluation today patient appears to be doing decently well in regard to his leg wound this is good news. Unfortunately the lateral foot wound seems to be somewhat dry at this point I think we continue to go back to the [place] that seem to be doing better for him. With that being said with regard to the snap VAC on the dorsal surface of his foot it apparently did not seal well and continued to leak unfortunately. He had a lot of issues and never was able to get it to function properly since we put this on last week. Nonetheless I did advise that we could give this another try putting on a little bit differently that may allow this to be able to function appropriately. 06/27/2020  upon evaluation today patient appears to be doing very well in regard to his left foot dorsal wound. This is actually showing signs of excellent improvement and overall seems to be managing quite nicely in my opinion. With that being said I do feel like that the plantar aspect of his foot is a little bit more significant at this time he still has some depth and we really have not gotten this to feeling very well at all. The lateral portion of the foot the Santyl does seem to be helping clear away and the leg is going require some debridement to remove some of the dead tissue and skin from around the edges of the wound but overall seems to be doing much better I think that is very close to complete closure. 07/07/2020 upon evaluation today patient appears to be doing well with regard to his wounds in general. The plantar foot ulcer is actually the most concerning that of anything worsening at this time. I do believe that he could potentially benefit from a continuation of packing in the plantar foot in order to keep this open and draining I do not want it to seal up and cause any trouble. With regard to the leg this is healed with regard to BURNIS, HALLING. ([id number]) the lateral portion of his foot I think that is actually doing better there were still making some slow progress here the top of his foot looks excellent. Electronic Signature(s) Signed: 07/07/2020 3:34:26 PM By: Worthy Keeler PA-C Entered By: Worthy Keeler on 07/07/2020 15:34:26 Ralph Peterson ([id number]) -------------------------------------------------------------------------------- Physical Exam Details Patient Name: Ralph Peterson Date of Service: 07/07/2020 2:30 PM Medical Record Number: [id number] Patient Account Number: [id number] Date of Birth/Sex: 1966/01/15 (54 y.o. M) Treating RN: Cornell Barman Primary Care Provider: Vidal Schwalbe Other Clinician: Referring Provider: Vidal Schwalbe Treating  Provider/Extender: Skipper Cliche in Treatment: 43 Constitutional Well-nourished and well-hydrated in no acute distress. Respiratory normal breathing without difficulty. Psychiatric this patient is able to make decisions and demonstrates good insight into disease process. Alert and Oriented x 3. pleasant and cooperative. Notes Upon inspection patient's wound bed actually showed signs of good granulation at this time. There does not appear to be any evidence of active infection which is great news and overall I am very pleased with where things stand. I think he is making progress pretty much across the board the plantar foot even appears to be closing in and doing better but unfortunately he still has areas that are not doing quite as well as would like  to see from the standpoint of fluid collected underneath apparently he did run out of the [employer] rope and therefore did not have anything packed into this area that may have been part of the issue as well while receiving so much drainage today. Electronic Signature(s) Signed: 07/07/2020 3:35:05 PM By: Worthy Keeler PA-C Entered By: Worthy Keeler on 07/07/2020 15:35:05 Ralph Peterson ([id number]) -------------------------------------------------------------------------------- Physician Orders Details Patient Name: Ralph Peterson Date of Service: 07/07/2020 2:30 PM Medical Record Number: [id number] Patient Account Number: [id number] Date of Birth/Sex: Jun 26, 1966 (54 y.o. M) Treating RN: Dolan Amen Primary Care Provider: Vidal Schwalbe Other Clinician: Referring Provider: Vidal Schwalbe Treating Provider/Extender: Skipper Cliche in Treatment: 10 Verbal / Phone Orders: No Diagnosis Coding ICD-10 Coding Code Description [phone] Pressure ulcer of other site, stage 3 T81.31XA Disruption of external operation (surgical) wound, not elsewhere classified, initial encounter L97.512 Non-pressure chronic ulcer  of other part of right foot with fat layer exposed L97.812 Non-pressure chronic ulcer of other part of right lower leg with fat layer exposed E11.621 Type 2 diabetes mellitus with foot ulcer I50.42 Chronic combined systolic (congestive) and diastolic (congestive) heart failure N18.6 End stage renal disease Z99.2 Dependence on renal dialysis Wound Cleansing Wound #1 Right,Lateral Foot o Clean wound with Normal Saline. Wound #2 Right,Dorsal Foot o Clean wound with Normal Saline. Wound #5 Right,Plantar Foot o Clean wound with Normal Saline. Anesthetic (add to Medication List) Wound #1 Right,Lateral Foot o Topical Lidocaine 4% cream applied to wound bed prior to debridement (In Clinic Only). Wound #2 Right,Dorsal Foot o Topical Lidocaine 4% cream applied to wound bed prior to debridement (In Clinic Only). Wound #5 Right,Plantar Foot o Topical Lidocaine 4% cream applied to wound bed prior to debridement (In Clinic Only). Primary Wound Dressing Wound #1 Right,Lateral Foot o Santyl Ointment Wound #2 Right,Dorsal Foot o Silver Collagen Wound #5 Right,Plantar Foot o Hydrafera Blue Ready Transfer - rope packed gently into wound Secondary Dressing Wound #1 Right,Lateral Foot o ABD and Kerlix/Conform Wound #2 Right,Dorsal Foot o Boardered Foam Dressing Wound #5 Right,Plantar Foot o ABD and Kerlix/Conform Dressing Change Frequency Wound #1 Right,Lateral Foot Ralph Peterson, Elya S. ([id number]) o Change dressing every day. Wound #2 Right,Dorsal Foot o Three times weekly Wound #5 Right,Plantar Foot o Change dressing every day. Follow-up Appointments Wound #1 Right,Lateral Foot o Return Appointment in 1 week. Wound #2 Right,Dorsal Foot o Return Appointment in 1 week. Wound #5 Right,Plantar Foot o Return Appointment in 1 week. Edema Control Wound #1 Right,Lateral Foot o Elevate legs to the level of the heart and pump ankles as often as possible -  Tubigrip E Wound #2 Right,Dorsal Foot o Elevate legs to the level of the heart and pump ankles as often as possible - Tubigrip E Wound #5 Right,Plantar Foot o Elevate legs to the level of the heart and pump ankles as often as possible - Tubigrip E Off-Loading Wound #1 Right,Lateral Foot o Other: - front off-loader right Wound #2 Right,Dorsal Foot o Other: - front off-loader right Wound #5 Right,Plantar Foot o Other: - front off-loader right Additional Orders / Instructions Wound #1 Right,Lateral Foot o Increase protein intake. Wound #2 Right,Dorsal Foot o Increase protein intake. Wound #5 Right,Plantar Foot o Increase protein intake. Electronic Signature(s) Signed: 07/07/2020 4:28:23 PM By: Worthy Keeler PA-C Signed: 07/07/2020 4:47:03 PM By: Georges Mouse, Minus Breeding RN Entered By: Georges Mouse, Minus Breeding on 07/07/2020 15:35:42 Ralph Peterson ([id number]) -------------------------------------------------------------------------------- Problem List Details Patient Name: Ralph Peterson Date  of Service: 07/07/2020 2:30 PM Medical Record Number: [id number] Patient Account Number: [id number] Date of Birth/Sex: 1966-04-18 (54 y.o. M) Treating RN: Cornell Barman Primary Care Provider: Vidal Schwalbe Other Clinician: Referring Provider: Vidal Schwalbe Treating Provider/Extender: Skipper Cliche in Treatment: 10 Active Problems ICD-10 Encounter Code Description Active Date MDM Diagnosis L89.893 Pressure ulcer of other site, stage 3 04/27/2020 No Yes T81.31XA Disruption of external operation (surgical) wound, not elsewhere 04/27/2020 No Yes classified, initial encounter L97.512 Non-pressure chronic ulcer of other part of right foot with fat layer 04/27/2020 No Yes exposed L97.812 Non-pressure chronic ulcer of other part of right lower leg with fat layer 04/27/2020 No Yes exposed E11.621 Type 2 diabetes mellitus with foot ulcer 04/27/2020 No Yes I50.42  Chronic combined systolic (congestive) and diastolic (congestive) heart 04/27/2020 No Yes failure N18.6 End stage renal disease 04/27/2020 No Yes Z99.2 Dependence on renal dialysis 04/27/2020 No Yes Inactive Problems Resolved Problems Electronic Signature(s) Signed: 07/07/2020 2:34:20 PM By: Worthy Keeler PA-C Entered By: Worthy Keeler on 07/07/2020 14:34:20 Ralph Peterson ([id number]) -------------------------------------------------------------------------------- Progress Note Details Patient Name: Ralph Peterson Date of Service: 07/07/2020 2:30 PM Medical Record Number: [id number] Patient Account Number: [id number] Date of Birth/Sex: [birth year] (54 y.o. 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 He does have a couple callus areas that have ulcerations underneath on the left foot. 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 Fortunately there is no sign of active infection at this time which is good news he has been using Telfa pads after applying Betadine this is not really absorbing enough of the drainage however which is causing the area especially at the amputation site to be very macerated. The patient states that he actually has a work-up with Dr. Delana Meyer at [facility] vascular next Thursday. For that reason we did not perform ABIs today and again I really am not doing any significant debridement today we will discontinue mild things to remove some of the necrotic debris so that  we hopefully get these wounds moving in the proper direction. With that being said his extremities do appear to be warm without any signs obviously of significant arterial flow. The patient does have a history of diabetes mellitus type 2, congestive heart failure, end- stage renal disease, and dependence on renal dialysis. He notes that he did have an exacerbation of heart failure recently which caused his legs to swell he had a couple wounds on his left leg but fortunately these have actually improved and are healed as of today. 05/09/2020 on evaluation today patient appears to be doing decently well all things considered in regard to his wounds. He does have the sutures out at this point in regard to his right amputation site. That does seem to be showing signs of improvement which is good news from the standpoint of at least being able to get some of the slough out although he does have quite a bit of slough buildup on the surface of the wound here. I think that is can require debridement before were able  to see dramatic filling in. He may even require wound VAC at the site. With regard to the lateral foot this seems to be loosening up with the Santyl and I am pleased in that regard. In regard to the left foot everything appears to be healed and is doing great. 05/18/2020 on evaluation today patient appears to be doing well at this time in regard to his wounds. 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 There is no signs of active infection at this time. 05/26/2020 on evaluation today patient appears to be actually showing signs of improvement in general in regard to his wounds. The plantar aspect wound does connect to the dorsal surface wound which I initially expected  but after last week was hopeful that that would not be the case. I was in fact hoping it would completely sealed up this week but it does not seem to have happened. There is no signs of active infection at this time which is great news. With that being said he does still have tender noted on the lateral aspect of the foot where the wound is there but this does not seem to be nearly as slough covered in fact I was able to carefully clean this off today and other than the fact that we need to help seal up the tendon I do not see any signs of infection at this point. 06/02/2020 upon evaluation today patient actually is making excellent improvement in regard to all of his wounds. In fact the plantar aspect of his foot looks like it may have sealed up although I cannot be 299% certain in this regard. With that being said I do see where there was some drainage on his dressing but today I cannot find any real depth to the wound opening and it appears to be close to closed if not completely. Nonetheless I do think that the dorsal surface of the foot is doing excellent in fact we have gotten approval for snap VAC but I am not even sure that skin to be necessary he is healing so well already. In regard to the lateral foot I do feel like the collagen has been beneficial here and my recommendation is good to be that we likely continue as such with this. Tenderness still exposed but seems to be nice and moist I see no issues in that regard. 06/09/2020 on evaluation today patient appears to be doing pretty well in regard to his foot ulcers although the plantar ulcer does appear to be deeper than what I was able to measure last week that is unfortunate. The lateral foot seems to be doing okay. Fortunately there is no signs of active infection at this time. How are in regard to the blister on the lower leg on the right that opened last time this appears to be erythematous around and I feel like it is also warm to  touch I am concerned about infection here. 06/13/2020 on evaluation today patient appears to be doing excellent in regard to his lateral foot wound is not measuring a lot smaller but it does look like it is fairly healthy at this time. His culture did show no signs of growth at this point which is good news. With that being said he has been  taking the antibiotic and does feel like that has helped his leg is feeling better. This is good news. There are signs of new epithelial growth as well. With regard to his plantar foot this still shows signs of some depth its really slowly closing up and again I am not to concerned but and I cannot find where it directly connects to the area above but there may be some small connections. Either way I think that potentially the goal would be to try the snap VAC today to see if this could be beneficial I think it may help to granulate in the upper wound and if we do that may be the plantar wound will show signs of improvement a little bit more sufficiently. 06/20/2020 upon evaluation today patient appears to be doing decently well in regard to his leg wound this is good news. Unfortunately the lateral foot wound seems to be somewhat dry at this point I think we continue to go back to the [place] that seem to be doing better for him. With that being said with regard to the snap VAC on the dorsal surface of his foot it apparently did not seal well and continued to leak unfortunately. He had a lot of issues and never was able to get it to function properly since we put this on last week. Nonetheless I did advise that we could give this another try putting on a little bit differently that may allow this to be able to function appropriately. 06/27/2020 upon evaluation today patient appears to be doing very well in regard to his left foot dorsal wound. This is actually showing signs of excellent improvement and overall seems to be managing quite nicely in my opinion. With  that being said I do feel like that the plantar aspect of his foot is a little bit more significant at this time he still has some depth and we really have not gotten this to feeling very well at all. The lateral portion of the foot the Santyl does seem to be helping clear away and the leg is going require some debridement to remove some of the dead Ralph Peterson, Ralph S. ([id number]) tissue and skin from around the edges of the wound but overall seems to be doing much better I think that is very close to complete closure. 07/07/2020 upon evaluation today patient appears to be doing well with regard to his wounds in general. The plantar foot ulcer is actually the most concerning that of anything worsening at this time. I do believe that he could potentially benefit from a continuation of packing in the plantar foot in order to keep this open and draining I do not want it to seal up and cause any trouble. With regard to the leg this is healed with regard to the lateral portion of his foot I think that is actually doing better there were still making some slow progress here the top of his foot looks excellent. Objective Constitutional Well-nourished and well-hydrated in no acute distress. Vitals Time Taken: 2:51 PM, Height: 72 in, Weight: 225 lbs, BMI: 30.5, Temperature: 98.8 F, Pulse: 81 bpm, Respiratory Rate: 18 breaths/min, Blood Pressure: 122/81 mmHg. Respiratory normal breathing without difficulty. Psychiatric this patient is able to make decisions and demonstrates good insight into disease process. Alert and Oriented x 3. pleasant and cooperative. General Notes: Upon inspection patient's wound bed actually showed signs of good granulation at this time. There does not appear to be any evidence of active infection which is  great news and overall I am very pleased with where things stand. I think he is making progress pretty much across the board the plantar foot even appears to be closing in  and doing better but unfortunately he still has areas that are not doing quite as well as would like to see from the standpoint of fluid collected underneath apparently he did run out of the Hydrofera Blue rope and therefore did not have anything packed into this area that may have been part of the issue as well while receiving so much drainage today. Integumentary (Hair, Skin) Wound #1 status is Open. Original cause of wound was Pressure Injury. The wound is located on the Right,Lateral Foot. The wound measures 1.9cm length x 1.9cm width x 0.2cm depth; 2.835cm^2 area and 0.567cm^3 volume. There is joint, tendon, and Fat Layer (Subcutaneous Tissue) exposed. There is no tunneling or undermining noted. There is a medium amount of serosanguineous drainage noted. The wound margin is distinct with the outline attached to the wound base. There is medium (34-66%) red, hyper - granulation within the wound bed. There is no necrotic tissue within the wound bed. Wound #2 status is Open. Original cause of wound was Surgical Injury. The wound is located on the Right,Dorsal Foot. The wound measures 1.8cm length x 0.3cm width x 0.2cm depth; 0.424cm^2 area and 0.085cm^3 volume. There is Fat Layer (Subcutaneous Tissue) exposed. There is no tunneling or undermining noted. There is a large amount of serosanguineous drainage noted. The wound margin is distinct with the outline attached to the wound base. There is large (67-100%) red granulation within the wound bed. There is no necrotic tissue within the wound bed. Wound #5 status is Open. Original cause of wound was Surgical Injury. The wound is located on the Ralph Peterson. The wound measures 0.1cm length x 0.1cm width x 2.5cm depth; 0.008cm^2 area and 0.02cm^3 volume. There is no tunneling or undermining noted. There is a medium amount of serosanguineous drainage noted. The wound margin is distinct with the outline attached to the wound base. There is  no granulation within the wound bed. There is no necrotic tissue within the wound bed. Wound #6 status is Healed - Epithelialized. Original cause of wound was Gradually Appeared. The wound is located on the Right,Proximal,Anterior Lower Leg. The wound measures 0cm length x 0cm width x 0cm depth; 0cm^2 area and 0cm^3 volume. There is Fat Layer (Subcutaneous Tissue) exposed. There is a none present amount of drainage noted. The wound margin is flat and intact. There is no granulation within the wound bed. There is no necrotic tissue within the wound bed. 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 ([id number]) Plan Wound Cleansing: Wound #1 Right,Lateral Foot: Clean wound with Normal Saline. Wound #2 Right,Dorsal Foot: Clean wound with Normal Saline. Wound #5 Right,Plantar Foot: Clean wound with Normal Saline. Anesthetic (add to Medication List): Wound #1 Right,Lateral Foot: Topical Lidocaine 4% cream applied to wound bed prior to debridement (In Clinic Only). Wound #2 Right,Dorsal Foot: Topical Lidocaine 4% cream applied to wound bed prior to debridement (In Clinic Only). Wound #5 Right,Plantar  Foot: Topical Lidocaine 4% cream applied to wound bed prior to debridement (In Clinic Only). Primary Wound Dressing: Wound #1 Right,Lateral Foot: Santyl Ointment Wound #2 Right,Dorsal Foot: Silver Collagen Wound #5 Right,Plantar Foot: Hydrafera Blue Ready Transfer - rope packed gently into wound Secondary  Dressing: Wound #1 Right,Lateral Foot: ABD and Kerlix/Conform Wound #2 Right,Dorsal Foot: Boardered Foam Dressing Wound #5 Right,Plantar Foot: ABD and Kerlix/Conform Dressing Change Frequency: Wound #1 Right,Lateral Foot: Change dressing every day. Wound #2 Right,Dorsal Foot: Three times weekly Wound #5 Right,Plantar Foot: Change dressing every day. Follow-up Appointments: Wound #1 Right,Lateral Foot: Return Appointment in 1 week. Wound #2 Right,Dorsal Foot: Return Appointment in 1 week. Wound #5 Right,Plantar Foot: Return Appointment in 1 week. Edema Control: Wound #1 Right,Lateral Foot: Elevate legs to the level of the heart and pump ankles as often as possible - Tubigrip E Wound #2 Right,Dorsal Foot: Elevate legs to the level of the heart and pump ankles as often as possible - Tubigrip E Wound #5 Right,Plantar Foot: Elevate legs to the level of the heart and pump ankles as often as possible - Tubigrip E Off-Loading: Wound #1 Right,Lateral Foot: Other: - front off-loader right Wound #2 Right,Dorsal Foot: Other: - front off-loader right Wound #5 Right,Plantar Foot: Other: - front off-loader right Additional Orders / Instructions: Wound #1 Right,Lateral Foot: Increase protein intake. Wound #2 Right,Dorsal Foot: Increase protein intake. Wound #5 Right,Plantar Foot: Increase protein intake. 1. Would recommend currently that we continue with the Hydrofera Blue rope I think that still the best way to go. 2. I am also can recommend currently that we have the patient continue to monitor for any signs of worsening infection. Obviously if anything ensues he should let us know as soon as possible. ZAKARI, BATHE. ([id number]) 3. I am also can recommend currently that we have the patient continue to use the Santyl for the lateral foot wound that is keeping this clean and I do feel like it looks much better today compared to previous. We will see patient back for  reevaluation in 1 week here in the clinic. If anything worsens or changes patient will contact our office for additional recommendations. Electronic Signature(s) Signed: 07/07/2020 3:35:44 PM By: Worthy Keeler PA-C Entered By: Worthy Keeler on 07/07/2020 15:35:44 Ralph Peterson ([id number]) -------------------------------------------------------------------------------- SuperBill Details Patient Name: Ralph Peterson Date of Service: 07/07/2020 Medical Record Number: [id number] Patient Account Number: [id number] Date of Birth/Sex: 11-18-1965 (54 y.o. M) Treating RN: Cornell Barman Primary Care Provider: Vidal Schwalbe Other Clinician: Referring Provider: Vidal Schwalbe Treating Provider/Extender: Skipper Cliche in Treatment: 10 Diagnosis Coding ICD-10 Codes Code Description [phone] Pressure ulcer of other site, stage 3 T81.31XA Disruption of external operation (surgical) wound, not elsewhere classified, initial encounter 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d L97.812 Non-pressure chronic ulcer of other part of right lower leg with fat layer exposed E11.621 Type 2 diabetes mellitus with foot ulcer I50.42 Chronic combined systolic (congestive) and diastolic (congestive) heart failure N18.6 End stage renal disease Z99.2 Dependence on renal dialysis Facility Procedures CPT4 Code: [id number] Description: 99213 - WOUND CARE VISIT-LEV 3 EST PT Modifier: Quantity: 1 Physician Procedures CPT4 Code: [id number] Description: 99213 - WC PHYS LEVEL 3 - EST PT Modifier: Quantity: 1 CPT4 Code: Description: ICD-10 Diagnosis Description L89.893 Pressure ulcer of other site, stage 3 T81.31XA Disruption of external operation (surgical) wound, not elsewhere classifi L97.512 Non-pressure chronic ulcer of other part of right foot with fat layer exp  O36.067 Non-pressure chronic ulcer of other part of right lower leg with fat laye Modifier:  ed, initial encounter osed r exposed Quantity: Electronic Signature(s) Signed: 07/07/2020 4:28:23 PM By: Worthy Keeler PA-C Signed: 07/07/2020 4:47:03 PM By: Charlett Nose RN Previous Signature: 07/07/2020 3:35:58 PM Version By: Worthy Keeler PA-C Entered By: Georges Mouse, Minus Breeding on 07/07/2020 15:36:35

## 2020-07-07 NOTE — Progress Notes (Addendum)
Ralph Peterson ([id number]) Visit Report for 07/07/2020 Arrival Information Details Patient Name: Ralph Peterson, Ralph Peterson. Date of Service: 07/07/2020 2:30 PM Medical Record Number: [id number] Patient Account Number: [id number] Date of Birth/Sex: [birth year] (54 y.o. M) Treating RN: Dolan Amen Primary Care Orelia Brandstetter: Vidal Schwalbe Other Clinician: Referring Yanelie Abraha: Vidal Schwalbe Treating Keondre Markson/Extender: Skipper Cliche in Treatment: 10 Visit Information History Since Last Visit Pain Present Now: No Patient Arrived: Wheel Chair Arrival Time: 14:51 Accompanied By: family member Transfer Assistance: None Patient Identification Verified: Yes Secondary Verification Process Completed: Yes Patient Requires Transmission-Based Precautions: No Patient Has Alerts: No Electronic Signature(s) Signed: 07/07/2020 4:47:03 PM By: Georges Mouse, Minus Breeding RN Entered By: Georges Mouse, Kenia on 07/07/2020 14:51:49 Ralph Peterson ([id number]) -------------------------------------------------------------------------------- Clinic Level of Care Assessment Details Patient Name: Ralph Peterson Date of Service: 07/07/2020 2:30 PM Medical Record Number: [id number] Patient Account Number: [id number] Date of Birth/Sex: March 14, 1966 (54 y.o. M) Treating RN: Dolan Amen Primary Care Zoejane Gaulin: Vidal Schwalbe Other Clinician: Referring Erisha Paugh: Vidal Schwalbe Treating Areona Homer/Extender: Skipper Cliche in Treatment: 10 Clinic Level of Care Assessment Items TOOL 4 Quantity Score X - Use when only an EandM is performed on FOLLOW-UP visit 1 0 ASSESSMENTS - Nursing Assessment / Reassessment X - Reassessment of Co-morbidities (includes updates in patient status) 1 10 X- 1 5 Reassessment of Adherence to Treatment Plan ASSESSMENTS - Wound and Skin Assessment / Reassessment []  - Simple Wound Assessment / Reassessment - one wound 0 X- 1 5 Complex Wound Assessment / Reassessment -  multiple wounds []  - 0 Dermatologic / Skin Assessment (not related to wound area) ASSESSMENTS - Focused Assessment []  - Circumferential Edema Measurements - multi extremities 0 []  - 0 Nutritional Assessment / Counseling / Intervention []  - 0 Lower Extremity Assessment (monofilament, tuning fork, pulses) []  - 0 Peripheral Arterial Disease Assessment (using hand held doppler) ASSESSMENTS - Ostomy and/or Continence Assessment and Care []  - Incontinence Assessment and Management 0 []  - 0 Ostomy Care Assessment and Management (repouching, etc.) PROCESS - Coordination of Care X - Simple Patient / Family Education for ongoing care 1 15 []  - 0 Complex (extensive) Patient / Family Education for ongoing care []  - 0 Staff obtains Programmer, systems, Records, Test Results / Process Orders []  - 0 Staff telephones HHA, Nursing Homes / Clarify orders / etc []  - 0 Routine Transfer to another Facility (non-emergent condition) []  - 0 Routine Hospital Admission (non-emergent condition) []  - 0 New Admissions / Biomedical engineer / Ordering NPWT, Apligraf, etc. []  - 0 Emergency Hospital Admission (emergent condition) X- 1 10 Simple Discharge Coordination []  - 0 Complex (extensive) Discharge Coordination PROCESS - Special Needs []  - Pediatric / Minor Patient Management 0 []  - 0 Isolation Patient Management []  - 0 Hearing / Language / Visual special needs []  - 0 Assessment of Community assistance (transportation, D/C planning, etc.) []  - 0 Additional assistance / Altered mentation []  - 0 Support Surface(s) Assessment (bed, cushion, seat, etc.) INTERVENTIONS - Wound Cleansing / Measurement Kearley, Damico S. ([id number]) []  - 0 Simple Wound Cleansing - one wound X- 1 5 Complex Wound Cleansing - multiple wounds X- 1 5 Wound Imaging (photographs - any number of wounds) []  - 0 Wound Tracing (instead of photographs) []  - 0 Simple Wound Measurement - one wound X- 1 5 Complex Wound  Measurement - multiple wounds INTERVENTIONS - Wound Dressings []  - Small Wound Dressing one or multiple wounds 0 X- 1 15 Medium Wound Dressing one or multiple wounds []  - 0 Large  Wound Dressing one or multiple wounds []  - 0 Application of Medications - topical []  - 0 Application of Medications - injection INTERVENTIONS - Miscellaneous []  - External ear exam 0 []  - 0 Specimen Collection (cultures, biopsies, blood, body fluids, etc.) []  - 0 Specimen(s) / Culture(s) sent or taken to Lab for analysis []  - 0 Patient Transfer (multiple staff / Civil Service fast streamer / Similar devices) []  - 0 Simple Staple / Suture removal (25 or less) []  - 0 Complex Staple / Suture removal (26 or more) []  - 0 Hypo / Hyperglycemic Management (close monitor of Blood Glucose) []  - 0 Ankle / Brachial Index (ABI) - do not check if billed separately X- 1 5 Vital Signs Has the patient been seen at the hospital within the last three years: Yes Total Score: 80 Level Of Care: New/Established - Level 3 Electronic Signature(s) Signed: 07/07/2020 4:47:03 PM By: Georges Mouse, Minus Breeding RN Entered By: Georges Mouse, Kenia on 07/07/2020 15:36:24 Ralph Peterson ([id number]) -------------------------------------------------------------------------------- Encounter Discharge Information Details Patient Name: Ralph Peterson. Date of Service: 07/07/2020 2:30 PM Medical Record Number: [id number] Patient Account Number: [id number] Date of Birth/Sex: 07/21/1966 (54 y.o. M) Treating RN: Dolan Amen Primary Care Serayah Yazdani: Vidal Schwalbe Other Clinician: Referring Nautika Cressey: Vidal Schwalbe Treating Arrabella Westerman/Extender: Skipper Cliche in Treatment: 10 Encounter Discharge Information Items Discharge Condition: Stable Ambulatory Status: Wheelchair Discharge Destination: Home Transportation: Private Auto Accompanied By: family member Schedule Follow-up Appointment: Yes Clinical Summary of Care: Electronic  Signature(s) Signed: 07/07/2020 4:47:03 PM By: Georges Mouse, Minus Breeding RN Entered By: Georges Mouse, Minus Breeding on 07/07/2020 15:38:11 Ralph Peterson ([id number]) -------------------------------------------------------------------------------- Lower Extremity Assessment Details Patient Name: Ralph Peterson Date of Service: 07/07/2020 2:30 PM Medical Record Number: [id number] Patient Account Number: [id number] Date of Birth/Sex: Feb 24, 1966 (54 y.o. M) Treating RN: Dolan Amen Primary Care Henery Betzold: Vidal Schwalbe Other Clinician: Referring Kharma Sampsel: Vidal Schwalbe Treating Jada Kuhnert/Extender: Jeri Cos Weeks in Treatment: 10 Edema Assessment Assessed: [Left: No] [Right: Yes] [Left: Edema] [Right: :] Vascular Assessment Pulses: Dorsalis Pedis Palpable: [Right:Yes] Electronic Signature(s) Signed: 07/07/2020 4:47:03 PM By: Georges Mouse, Minus Breeding RN Entered By: Georges Mouse, Kenia on 07/07/2020 15:09:45 Ralph Peterson ([id number]) -------------------------------------------------------------------------------- Multi Wound Chart Details Patient Name: Ralph Peterson. Date of Service: 07/07/2020 2:30 PM Medical Record Number: [id number] Patient Account Number: [id number] Date of Birth/Sex: Jun 14, 1966 (54 y.o. 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 Comorbid History: Arrhythmia, Congestive Heart  Arrhythmia, Congestive Heart Arrhythmia, Congestive Heart Failure, Coronary Artery Disease, Failure, Coronary Artery Disease, Failure, Coronary Artery Disease, Hypertension, Myocardial Infarction, Hypertension, Myocardial Infarction, Hypertension, Myocardial Infarction, Peripheral Venous Disease, Type II Peripheral Venous Disease, Type II Peripheral Venous Disease, Type II Diabetes, Neuropathy Diabetes, Neuropathy Diabetes, Neuropathy Date Acquired: 02/20/2020 04/20/2020 03/24/2020 Weeks of Treatment: 10 10 7  Wound Status: Open Open Open Measurements L x W x D (cm) 1.9x1.9x0.2 1.8x0.3x0.2 0.1x0.1x2.5 Area (cm) : 2.835 0.424 0.008 Volume (cm) : 0.567 0.085 0.02 % Reduction in Area: 44.00% 91.00% 98.80% % Reduction in Volume: 77.60% 94.00% 94.00% Classification: Category/Stage IV Full Thickness Without Exposed Grade 1 Support Structures Exudate Amount: Medium Large Medium Exudate  Type: Serosanguineous Serosanguineous Serosanguineous Exudate Color: red, brown red, brown red, brown Wound Margin: Distinct, outline attached Distinct, outline attached Distinct, outline attached Granulation Amount: Medium (34-66%) Large (67-100%) None Present (0%) Granulation Quality: Red, Hyper-granulation Red N/A Necrotic Amount: None Present (0%) None Present (0%) None Present (0%) Exposed Structures: Fat Layer (Subcutaneous Tissue): Fat Layer (Subcutaneous Tissue): Fascia: No Yes Yes Fat Layer (Subcutaneous Tissue): Tendon: Yes Fascia: No No Joint: Yes Tendon: No Tendon: No Fascia: No Muscle: No Muscle: No Muscle: No Joint: No Joint: No Bone: No Bone: No Bone: No Epithelialization: None Small (1-33%) None Wound Number: 6 N/A N/A Photos: N/A N/A JAYLN, MADEIRA ([id number]) Wound Location: Right, Proximal, Anterior Lower Leg N/A N/A Wounding Event: Gradually Appeared N/A N/A Primary Etiology: Diabetic Wound/Ulcer of the Lower N/A N/A Extremity Comorbid History: Arrhythmia, Congestive  Heart N/A N/A Failure, Coronary Artery Disease, Hypertension, Myocardial Infarction, Peripheral Venous Disease, Type II Diabetes, Neuropathy Date Acquired: 06/02/2020 N/A N/A Weeks of Treatment: 4 N/A N/A Wound Status: Healed - Epithelialized N/A N/A Measurements L x W x D (cm) 0x0x0 N/A N/A Area (cm) : 0 N/A N/A Volume (cm) : 0 N/A N/A % Reduction in Area: 100.00% N/A N/A % Reduction in Volume: 100.00% N/A N/A Classification: Grade 1 N/A N/A Exudate Amount: None Present N/A N/A Exudate Type: N/A N/A N/A Exudate Color: N/A N/A N/A Wound Margin: Flat and Intact N/A N/A Granulation Amount: None Present (0%) N/A N/A Granulation Quality: N/A N/A N/A Necrotic Amount: None Present (0%) N/A N/A Exposed Structures: Fat Layer (Subcutaneous Tissue): N/A N/A Yes Fascia: No Tendon: No Muscle: No Joint: No Bone: No Epithelialization: Large (67-100%) N/A N/A Treatment Notes Electronic Signature(s) Signed: 07/07/2020 4:47:03 PM By: Georges Mouse, Minus Breeding RN Entered By: Georges Mouse, Minus Breeding on 07/07/2020 15:20:51 Ralph Peterson ([id number]) -------------------------------------------------------------------------------- Multi-Disciplinary Care Plan Details Patient Name: Ralph Peterson Date of Service: 07/07/2020 2:30 PM Medical Record Number: [id number] Patient Account Number: [id number] Date of Birth/Sex: [birth year] (54 y.o. M) Treating RN: Dolan Amen Primary Care Aletha Allebach: Vidal Schwalbe Other Clinician: Referring Karisma Meiser: Vidal Schwalbe Treating Terricka Onofrio/Extender: Skipper Cliche in Treatment: 10 Active Inactive Necrotic Tissue Nursing Diagnoses: Impaired tissue integrity related to necrotic/devitalized tissue Knowledge deficit related to management of necrotic/devitalized tissue Goals: Necrotic/devitalized tissue will be minimized in the wound bed Date Initiated: 05/09/2020 Target Resolution Date: 05/09/2020 Goal Status:  Active Interventions: Assess patient pain level pre-, during and post procedure and prior to discharge Provide education on necrotic tissue and debridement process Treatment Activities: Enzymatic debridement : 05/09/2020 Notes: Orientation to the Wound Care Program Nursing Diagnoses: Knowledge deficit related to the wound healing center program Goals: Patient/caregiver will verbalize understanding of the [facility] Date Initiated: 04/27/2020 Target Resolution Date: 05/18/2020 Goal Status: Active Interventions: Provide education on orientation to the wound center Notes: Wound/Skin Impairment Nursing Diagnoses: Impaired tissue integrity Knowledge deficit related to ulceration/compromised skin integrity Goals: Patient/caregiver will verbalize understanding of skin care regimen Date Initiated: 04/27/2020 Target Resolution Date: 05/25/2020 Goal Status: Active Interventions: Assess patient/caregiver ability to obtain necessary supplies Assess patient/caregiver ability to perform ulcer/skin care regimen upon admission and as needed Assess ulceration(s) every visit Provide education on ulcer and skin care Treatment Activities: Skin care regimen initiated : 04/27/2020 KHYE, HOCHSTETLER ([id number]) Topical wound management initiated : 04/27/2020 Notes: Electronic Signature(s) Signed: 07/07/2020 4:47:03 PM By: Georges Mouse, Minus Breeding RN Entered By: Georges Mouse, Minus Breeding on 07/07/2020 15:17:07 Ralph Peterson ([id number]) -------------------------------------------------------------------------------- Pain Assessment Details Patient Name: Ralph Peterson Date  of Service: 07/07/2020 2:30 PM Medical Record Number: [id number] Patient Account Number: [id number] Date of Birth/Sex: 04-Aug-1965 (54 y.o. M) Treating RN: Dolan Amen Primary Care Westin Knotts: Vidal Schwalbe Other Clinician: Referring Kagan Mutchler: Vidal Schwalbe Treating Adylynn Hertenstein/Extender: Skipper Cliche in Treatment: 10 Active Problems Location of Pain Severity and Description of Pain Patient Has Paino No Site Locations Rate the pain. Current Pain Level: 0 Pain Management and Medication Current Pain Management: Electronic Signature(s) Signed: 07/07/2020 4:47:03 PM By: Georges Mouse, Minus Breeding RN Entered By: Georges Mouse, Kenia on 07/07/2020 14:52:24 Ralph Peterson ([id number]) -------------------------------------------------------------------------------- Wound Assessment Details Patient Name: Ralph Peterson. Date of Service: 07/07/2020 2:30 PM Medical Record Number: [id number] Patient Account Number: [id number] Date of Birth/Sex: 1966/05/23 (54 y.o. M) Treating RN: Dolan Amen Primary Care Imo [place]: Vidal Schwalbe Other Clinician: Referring Maisey Deandrade: Vidal Schwalbe Treating Kniyah Khun/Extender: Skipper Cliche in Treatment: 10 Wound Status Wound Number: 1 Primary Pressure Ulcer Etiology: Wound Location: Right, Lateral Foot Wound Open Wounding Event: Pressure Injury Status: Date Acquired: 02/20/2020 Comorbid Arrhythmia, Congestive Heart Failure, Coronary Artery Weeks Of Treatment: 10 History: Disease, Hypertension, Myocardial Infarction, Peripheral Clustered Wound: No Venous Disease, Type II Diabetes, Neuropathy Photos Wound Measurements Length: (cm) 1.9 Width: (cm) 1.9 Depth: (cm) 0.2 Area: (cm) 2.835 Volume: (cm) 0.567 % Reduction in Area: 44% % Reduction in Volume: 77.6% Epithelialization: None Tunneling: No Undermining: No Wound Description Classification: Category/Stage IV Wound Margin: Distinct, outline attached Exudate Amount: Medium Exudate Type: Serosanguineous Exudate Color: red, brown Foul Odor After Cleansing: No Slough/Fibrino No Wound Bed Granulation Amount: Medium (34-66%) Exposed Structure Granulation Quality: Red, Hyper-granulation Fascia Exposed: No Necrotic Amount: None Present (0%) Fat Layer (Subcutaneous  Tissue) Exposed: Yes Tendon Exposed: Yes Muscle Exposed: No Joint Exposed: Yes Bone Exposed: No Treatment Notes Wound #1 (Right, Lateral Foot) Notes wound 1-santyl, ABD, conform, wound 2-prisma, BFD, wound 5-h.blue rope, ABD Electronic Signature(s) LEANDRO, BERKOWITZ ([id number]) Signed: 07/07/2020 4:47:03 PM By: Georges Mouse, Minus Breeding RN Entered By: Georges Mouse, Minus Breeding on 07/07/2020 15:07:27 Ralph Peterson ([id number]) -------------------------------------------------------------------------------- Wound Assessment Details Patient Name: Ralph Peterson. Date of Service: 07/07/2020 2:30 PM Medical Record Number: [id number] Patient Account Number: [id number] Date of Birth/Sex: 1966/05/08 (54 y.o. M) Treating RN: Dolan Amen Primary Care Essance Gatti: Vidal Schwalbe Other Clinician: Referring Suzette Flagler: Vidal Schwalbe Treating Terren Haberle/Extender: Skipper Cliche in Treatment: 10 Wound Status Wound Number: 2 Primary Open Surgical Wound Etiology: Wound Location: Right, Dorsal Foot Wound Open Wounding Event: Surgical Injury Status: Date Acquired: 04/20/2020 Comorbid Arrhythmia, Congestive Heart Failure, Coronary Artery Weeks Of Treatment: 10 History: Disease, Hypertension, Myocardial Infarction, Peripheral Clustered Wound: No Venous Disease, Type II Diabetes, Neuropathy Photos Wound Measurements Length: (cm) 1.8 Width: (cm) 0.3 Depth: (cm) 0.2 Area: (cm) 0.424 Volume: (cm) 0.085 % Reduction in Area: 91% % Reduction in Volume: 94% Epithelialization: Small (1-33%) Tunneling: No Undermining: No Wound Description Classification: Full Thickness Without Exposed Support Structu Wound Margin: Distinct, outline attached Exudate Amount: Large Exudate Type: Serosanguineous Exudate Color: red, brown res Foul Odor After Cleansing: No Slough/Fibrino Yes Wound Bed Granulation Amount: Large (67-100%) Exposed Structure Granulation Quality: Red Fascia Exposed:  No Necrotic Amount: None Present (0%) Fat Layer (Subcutaneous Tissue) Exposed: Yes Tendon Exposed: No Muscle Exposed: No Joint Exposed: No Bone Exposed: No Treatment Notes Wound #2 (Right, Dorsal Foot) Notes wound 1-santyl, ABD, conform, wound 2-prisma, BFD, wound 5-h.blue rope, ABD Electronic Signature(s) CIARAN, BEGAY ([id number]) Signed: 07/07/2020 4:47:03 PM By: Georges Mouse, Minus Breeding RN Entered By: Georges Mouse, Minus Breeding on 07/07/2020 15:08:16 Molchan, Maude S. (  [id number]) -------------------------------------------------------------------------------- Wound Assessment Details Patient Name: HESSTON, HITCHENS. Date of Service: 07/07/2020 2:30 PM Medical Record Number: [id number] Patient Account Number: [id number] Date of Birth/Sex: 1966-02-02 (54 y.o. M) Treating RN: Dolan Amen Primary Care Doneta Bayman: Vidal Schwalbe Other Clinician: Referring Ikesha Siller: Vidal Schwalbe Treating Leeana Creer/Extender: Skipper Cliche in Treatment: 10 Wound Status Wound Number: 5 Primary Diabetic Wound/Ulcer of the Lower Extremity Etiology: Wound Location: Right, Plantar Foot Wound Open Wounding Event: Surgical Injury Status: Date Acquired: 03/24/2020 Comorbid Arrhythmia, Congestive Heart Failure, Coronary Artery Weeks Of Treatment: 7 History: Disease, Hypertension, Myocardial Infarction, Peripheral Clustered Wound: No Venous Disease, Type II Diabetes, Neuropathy Photos Wound Measurements Length: (cm) 0.1 Width: (cm) 0.1 Depth: (cm) 2.5 Area: (cm) 0.008 Volume: (cm) 0.02 % Reduction in Area: 98.8% % Reduction in Volume: 94% Epithelialization: None Tunneling: No Undermining: No Wound Description Classification: Grade 1 Wound Margin: Distinct, outline attached Exudate Amount: Medium Exudate Type: Serosanguineous Exudate Color: red, brown Foul Odor After Cleansing: No Slough/Fibrino Yes Wound Bed Granulation Amount: None Present (0%) Exposed  Structure Necrotic Amount: None Present (0%) Fascia Exposed: No Fat Layer (Subcutaneous Tissue) Exposed: No Tendon Exposed: No Muscle Exposed: No Joint Exposed: No Bone Exposed: No Treatment Notes Wound #5 (Right, Plantar Foot) Notes wound 1-santyl, ABD, conform, wound 2-prisma, BFD, wound 5-h.blue rope, ABD Electronic Signature(s) CANAAN, HOLZER ([id number]) Signed: 07/07/2020 4:47:03 PM By: Georges Mouse, Minus Breeding RN Entered By: Georges Mouse, Minus Breeding on 07/07/2020 15:16:57 Ralph Peterson ([id number]) -------------------------------------------------------------------------------- Wound Assessment Details Patient Name: Ralph Peterson. Date of Service: 07/07/2020 2:30 PM Medical Record Number: [id number] Patient Account Number: [id number] Date of Birth/Sex: [birth year] (54 y.o. M) Treating RN: Dolan Amen Primary Care Laurene Melendrez: Vidal Schwalbe Other Clinician: Referring Minah Axelrod: Vidal Schwalbe Treating [place]/Extender: Skipper Cliche in Treatment: 10 Wound Status Wound Number: 6 Primary Diabetic Wound/Ulcer of the Lower Extremity Etiology: Wound Location: Right, Proximal, Anterior Lower Leg Wound Healed - Epithelialized Wounding Event: Gradually Appeared Status: Date Acquired: 06/02/2020 Comorbid Arrhythmia, Congestive Heart Failure, Coronary Artery Weeks Of Treatment: 4 History: Disease, Hypertension, Myocardial Infarction, Peripheral Clustered Wound: No Venous Disease, Type II Diabetes, Neuropathy Photos Wound Measurements Length: (cm) 0 Width: (cm) 0 Depth: (cm) 0 Area: (cm) 0 Volume: (cm) 0 % Reduction in Area: 100% % Reduction in Volume: 100% Epithelialization: Large (67-100%) Wound Description Classification: Grade 1 Wound Margin: Flat and Intact Exudate Amount: None Present Foul Odor After Cleansing: No Slough/Fibrino No Wound Bed Granulation Amount: None Present (0%) Exposed Structure Necrotic Amount: None Present  (0%) Fascia Exposed: No Fat Layer (Subcutaneous Tissue) Exposed: Yes Tendon Exposed: No Muscle Exposed: No Joint Exposed: No Bone Exposed: No Electronic Signature(s) Signed: 07/07/2020 4:47:03 PM By: Georges Mouse, Minus Breeding RN Entered By: Georges Mouse, Minus Breeding on 07/07/2020 15:18:38 Ralph Peterson ([id number]) -------------------------------------------------------------------------------- Vitals Details Patient Name: Ralph Peterson Date of Service: 07/07/2020 2:30 PM Medical Record Number: [id number] Patient Account Number: [id number] Date of Birth/Sex: [birth year] (54 y.o. M) Treating RN: Dolan Amen Primary Care Samaad Hashem: Vidal Schwalbe Other Clinician: Referring Alainna Stawicki: Vidal Schwalbe Treating Arria Naim/Extender: Skipper Cliche in Treatment: 10 Vital Signs Time Taken: 14:51 Temperature (F): 98.8 Height (in): 72 Pulse (bpm): 81 Weight (lbs): 225 Respiratory Rate (breaths/min): 18 Body Mass Index (BMI): 30.5 Blood Pressure (mmHg): 122/81 Reference Range: 80 - 120 mg / dl Electronic Signature(s) Signed: 07/07/2020 4:47:03 PM By: Georges Mouse, Minus Breeding RN Entered By: Georges Mouse, Minus Breeding on 07/07/2020 14:52:07

## 2020-07-13 ENCOUNTER — Encounter: Payer: Medicare PPO | Admitting: Physician Assistant

## 2020-07-13 ENCOUNTER — Other Ambulatory Visit: Payer: Self-pay

## 2020-07-13 DIAGNOSIS — L89893 Pressure ulcer of other site, stage 3: Secondary | ICD-10-CM | POA: Diagnosis not present

## 2020-07-13 NOTE — Progress Notes (Signed)
KAIUS, DAINO ([id number]) Visit Report for 07/13/2020 Arrival Information Details Patient Name: Ralph Peterson, Ralph Peterson. Date of Service: 07/13/2020 3:00 PM Medical Record Number: [id number] Patient Account Number: [id number] Date of Birth/Sex: Jan 08, 1966 (54 y.o. M) Treating RN: Dolan Amen Primary Care Runa Whittingham: Vidal Schwalbe Other Clinician: Referring Irem Stoneham: Vidal Schwalbe Treating Kekoa Fyock/Extender: Skipper Cliche in Treatment: 11 Visit Information History Since Last Visit Pain Present Now: Yes Patient Arrived: Wheel Chair Arrival Time: 15:06 Accompanied By: son Transfer Assistance: None Patient Identification Verified: Yes Secondary Verification Process Completed: Yes Patient Requires Transmission-Based Precautions: No Patient Has Alerts: No Electronic Signature(s) Signed: 07/13/2020 4:19:23 PM By: Georges Mouse, Minus Breeding RN Entered By: Georges Mouse, Kenia on 07/13/2020 15:07:59 Ralph Peterson ([id number]) -------------------------------------------------------------------------------- Encounter Discharge Information Details Patient Name: Ralph Peterson Date of Service: 07/13/2020 3:00 PM Medical Record Number: [id number] Patient Account Number: [id number] Date of Birth/Sex: 16-Apr-1966 (54 y.o. M) Treating RN: Cornell Barman Primary Care Yaritsa Savarino: Vidal Schwalbe Other Clinician: Referring Evalette Montrose: Vidal Schwalbe Treating Kenard Morawski/Extender: Skipper Cliche in Treatment: 11 Encounter Discharge Information Items Post Procedure Vitals Discharge Condition: Stable Unable to obtain vitals Reason: limited time Ambulatory Status: Ambulatory Discharge Destination: Home Transportation: Private Auto Accompanied By: son Schedule Follow-up Appointment: Yes Clinical Summary of Care: Electronic Signature(s) Unsigned Entered By: Gretta Cool, BSN, RN, CWS, Kim on 07/13/2020 15:41:40 Signature(s): Date(s): Ralph Peterson  ([id number]) -------------------------------------------------------------------------------- Lower Extremity Assessment Details Patient Name: Ralph Peterson. Date of Service: 07/13/2020 3:00 PM Medical Record Number: [id number] Patient Account Number: [id number] Date of Birth/Sex: 1966/05/29 (54 y.o. M) Treating RN: Dolan Amen Primary Care Keila Turan: Vidal Schwalbe Other Clinician: Referring Sherlynn Tourville: Vidal Schwalbe Treating Hensley Aziz/Extender: Jeri Cos Weeks in Treatment: 11 Edema Assessment Assessed: [Left: No] [Right: Yes] Edema: [Left: N] [Right: o] Vascular Assessment Pulses: Dorsalis Pedis Palpable: [Right:Yes] Electronic Signature(s) Signed: 07/13/2020 4:19:23 PM By: Georges Mouse, Minus Breeding RN Entered By: Georges Mouse, Kenia on 07/13/2020 15:24:27 Ralph Peterson ([id number]) -------------------------------------------------------------------------------- Multi Wound Chart Details Patient Name: Ralph Peterson. Date of Service: 07/13/2020 3:00 PM Medical Record Number: [id number] Patient Account Number: [id number] Date of Birth/Sex: 13-Sep-1965 (54 y.o. M) Treating RN: Cornell Barman Primary Care Samarah Hogle: Vidal Schwalbe Other Clinician: Referring Sigourney Portillo: Vidal Schwalbe Treating Charleston Vierling/Extender: Skipper Cliche in Treatment: 11 Vital Signs Height(in): 72 Pulse(bpm): 17 Weight(lbs): 225 Blood Pressure(mmHg): 125/81 Body Mass Index(BMI): 31 Temperature(F): 98.8 Respiratory Rate(breaths/min): 18 Photos: [1:No Photos] [2:No Photos] [5:No Photos] Wound Location: [1:Right, Lateral Foot] [2:Right, Dorsal Foot] [5:Right, Plantar Foot] Wounding Event: [1:Pressure Injury] [2:Surgical Injury] [5:Surgical Injury] Primary Etiology: [1:Pressure Ulcer] [2:Open Surgical Wound] [5:Diabetic Wound/Ulcer of the Lower Extremity] Comorbid History: [1:Arrhythmia, Congestive Heart Failure, Coronary Artery Disease, Hypertension, Myocardial Infarction,  Hypertension, Myocardial Infarction, Hypertension, Myocardial Infarction, Peripheral Venous Disease, Type II Peripheral Venous  Disease, Type II Peripheral Venous Disease, Type II Diabetes, Neuropathy] [2:Arrhythmia, Congestive Heart Failure, Coronary Artery Disease, Diabetes, Neuropathy] [5:Arrhythmia, Congestive Heart Failure, Coronary Artery Disease, Diabetes, Neuropathy] Date Acquired: [1:02/20/2020] [2:04/20/2020] [5:03/24/2020] Weeks of Treatment: [1:11] [2:11] [5:8] Wound Status: [1:Open] [2:Open] [5:Open] Measurements L x W x D (cm) [1:2x1.5x0.2] [2:2.5x0.5x0.2] [5:0.5x0.1x0.3] Area (cm) : [1:2.356] [2:0.982] [5:0.039] Volume (cm) : [1:0.471] [2:0.196] [5:0.012] % Reduction in Area: [1:53.40%] [2:79.20%] [5:94.20%] % Reduction in Volume: [1:81.40%] [2:86.10%] [5:96.40%] Classification: [1:Category/Stage IV] [2:Full Thickness Without Exposed Support Structures] [5:Grade 1] Exudate Amount: [1:Medium] [2:Large] [5:Medium] Exudate Type: [1:Serosanguineous] [2:Serosanguineous] [5:Serosanguineous] Exudate Color: [1:red, brown] [2:red, brown] [5:red, brown] Wound Margin: [1:Distinct, outline attached] [2:Distinct, outline attached] [5:Distinct, outline attached] Granulation Amount: [1:Large (67-100%)] [2:Large (67-100%)] [5:None Present (0%)]  Granulation Quality: [1:Red] [2:Red] [5:N/A] Necrotic Amount: [1:Small (1-33%)] [2:None Present (0%)] [5:None Present (0%)] Exposed Structures: [1:Fat Layer (Subcutaneous Tissue): Fat Layer (Subcutaneous Tissue): Fascia: No Yes Tendon: Yes Joint: Yes Fascia: No Muscle: No Bone: No None] [2:Yes Fascia: No Tendon: No Muscle: No Joint: No Bone: No Small (1-33%)] [5:Fat Layer (Subcutaneous Tissue):  No Tendon: No Muscle: No Joint: No Bone: No None] Treatment Notes Electronic Signature(s) Unsigned Entered By: Gretta Cool, BSN, RN, CWS, Kim on 07/13/2020 15:34:39 Signature(s): Date(s): Ralph Peterson  ([id number]) -------------------------------------------------------------------------------- Multi-Disciplinary Care Plan Details Patient Name: Ralph Peterson, Ralph Peterson. Date of Service: 07/13/2020 3:00 PM Medical Record Number: [id number] Patient Account Number: [id number] Date of Birth/Sex: [birth year] (54 y.o. M) Treating RN: Dolan Amen Primary Care Davyn Elsasser: Vidal Schwalbe Other Clinician: Referring Deloise Marchant: Vidal Schwalbe Treating Indi Willhite/Extender: Skipper Cliche in Treatment: 11 Active Inactive Necrotic Tissue Nursing Diagnoses: Impaired tissue integrity related to necrotic/devitalized tissue Knowledge deficit related to management of necrotic/devitalized tissue Goals: Necrotic/devitalized tissue will be minimized in the wound bed Date Initiated: 05/09/2020 Target Resolution Date: 05/09/2020 Goal Status: Active Interventions: Assess patient pain level pre-, during and post procedure and prior to discharge Provide education on necrotic tissue and debridement process Treatment Activities: Enzymatic debridement : 05/09/2020 Notes: Orientation to the Wound Care Program Nursing Diagnoses: Knowledge deficit related to the wound healing center program Goals: Patient/caregiver will verbalize understanding of the [facility] Program Date Initiated: 04/27/2020 Target Resolution Date: 05/18/2020 Goal Status: Active Interventions: Provide education on orientation to the wound center Notes: Wound/Skin Impairment Nursing Diagnoses: Impaired tissue integrity Knowledge deficit related to ulceration/compromised skin integrity Goals: Patient/caregiver will verbalize understanding of skin care regimen Date Initiated: 04/27/2020 Target Resolution Date: 05/25/2020 Goal Status: Active Interventions: Assess patient/caregiver ability to obtain necessary supplies Assess patient/caregiver ability to perform ulcer/skin care regimen upon admission and as needed Assess  ulceration(s) every visit Provide education on ulcer and skin care Treatment Activities: Skin care regimen initiated : 04/27/2020 Ralph Peterson, Ralph Peterson ([id number]) Topical wound management initiated : 04/27/2020 Notes: Electronic Signature(s) Signed: 07/13/2020 4:19:23 PM By: Georges Mouse, Minus Breeding RN Entered By: Gretta Cool, BSN, RN, CWS, Kim on 07/13/2020 15:34:33 Ralph Peterson ([id number]) -------------------------------------------------------------------------------- Pain Assessment Details Patient Name: Ralph Peterson Date of Service: 07/13/2020 3:00 PM Medical Record Number: [id number] Patient Account Number: [id number] Date of Birth/Sex: Feb 21, 1966 (54 y.o. M) Treating RN: Dolan Amen Primary Care Haidynn Almendarez: Vidal Schwalbe Other Clinician: Referring Duquan Gillooly: Vidal Schwalbe Treating Tanasia Budzinski/Extender: Skipper Cliche in Treatment: 11 Active Problems Location of Pain Severity and Description of Pain Patient Has Paino Yes Site Locations Pain Location: Generalized Pain Rate the pain. Current Pain Level: 3 Pain Management and Medication Current Pain Management: Electronic Signature(s) Signed: 07/13/2020 4:19:23 PM By: Georges Mouse, Minus Breeding RN Entered By: Georges Mouse, Kenia on 07/13/2020 15:10:33 Ralph Peterson ([id number]) -------------------------------------------------------------------------------- Patient/Caregiver Education Details Patient Name: Ralph Peterson Date of Service: 07/13/2020 3:00 PM Medical Record Number: [id number] Patient Account Number: [id number] Date of Birth/Gender: 1966/04/30 (54 y.o. M) Treating RN: Cornell Barman Primary Care Physician: Vidal Schwalbe Other Clinician: Referring Physician: Vidal Schwalbe Treating Physician/Extender: Skipper Cliche in Treatment: 11 Education Assessment Education Provided To: Patient Education Topics Provided Wound Debridement: Handouts: Wound Debridement Methods:  Demonstration, Explain/Verbal Responses: State content correctly Wound/Skin Impairment: Handouts: Caring for Your Ulcer, Other: wound care as prescribed Methods: Demonstration, Explain/Verbal Responses: State content correctly Electronic Signature(s) Unsigned Entered By: Gretta Cool, BSN, RN, CWS, Kim on 07/13/2020 15:37:50 Signature(s): Date(s): Ralph Peterson ([id number]) -------------------------------------------------------------------------------- Wound Assessment Details Patient Name:  Ralph Peterson, Ralph S. Date of Service: 07/13/2020 3:00 PM Medical Record Number: [id number] Patient Account Number: [id number] Date of Birth/Sex: 1965/08/09 (54 y.o. M) Treating RN: Dolan Amen Primary Care Paisly Fingerhut: Vidal Schwalbe Other Clinician: Referring Rechel Delosreyes: Vidal Schwalbe Treating Johnatha Zeidman/Extender: Skipper Cliche in Treatment: 11 Wound Status Wound Number: 1 Primary Pressure Ulcer Etiology: Wound Location: Right, Lateral Foot Wound Open Wounding Event: Pressure Injury Status: Date Acquired: 02/20/2020 Comorbid Arrhythmia, Congestive Heart Failure, Coronary Artery Weeks Of Treatment: 11 History: Disease, Hypertension, Myocardial Infarction, Peripheral Clustered Wound: No Venous Disease, Type II Diabetes, Neuropathy Photos Photo Uploaded By: Georges Mouse, Minus Breeding on 07/13/2020 16:03:28 Wound Measurements Length: (cm) 2 Width: (cm) 1.5 Depth: (cm) 0.2 Area: (cm) 2.356 Volume: (cm) 0.471 % Reduction in Area: 53.4% % Reduction in Volume: 81.4% Epithelialization: None Wound Description Classification: Category/Stage IV Wound Margin: Distinct, outline attached Exudate Amount: Medium Exudate Type: Serosanguineous Exudate Color: red, brown Foul Odor After Cleansing: No Slough/Fibrino Yes Wound Bed Granulation Amount: Large (67-100%) Exposed Structure Granulation Quality: Red Fascia Exposed: No Necrotic Amount: Small (1-33%) Fat Layer (Subcutaneous Tissue)  Exposed: Yes Necrotic Quality: Adherent Slough Tendon Exposed: Yes Muscle Exposed: No Joint Exposed: Yes Bone Exposed: No Treatment Notes Wound #1 (Right, Lateral Foot) Notes Alginate on plantar, collagen other, abd and conform. Ralph Peterson, Ralph Peterson ([id number]) Electronic Signature(s) Signed: 07/13/2020 4:19:23 PM By: Georges Mouse, Minus Breeding RN Entered By: Georges Mouse, Minus Breeding on 07/13/2020 15:22:48 Ralph Peterson ([id number]) -------------------------------------------------------------------------------- Wound Assessment Details Patient Name: Ralph Peterson Date of Service: 07/13/2020 3:00 PM Medical Record Number: [id number] Patient Account Number: [id number] Date of Birth/Sex: 04/27/1966 (54 y.o. M) Treating RN: Dolan Amen Primary Care [name] [name]: Vidal Schwalbe Other Clinician: Referring Lashelle Koy: Vidal Schwalbe Treating Shadrack Brummitt/Extender: Skipper Cliche in Treatment: 11 Wound Status Wound Number: 2 Primary Open Surgical Wound Etiology: Wound Location: Right, Dorsal Foot Wound Open Wounding Event: Surgical Injury Status: Date Acquired: 04/20/2020 Comorbid Arrhythmia, Congestive Heart Failure, Coronary Artery Weeks Of Treatment: 11 History: Disease, Hypertension, Myocardial Infarction, Peripheral Clustered Wound: No Venous Disease, Type II Diabetes, Neuropathy Photos Photo Uploaded By: Georges Mouse, Minus Breeding on 07/13/2020 16:03:29 Wound Measurements Length: (cm) 2.5 Width: (cm) 0.5 Depth: (cm) 0.2 Area: (cm) 0.982 Volume: (cm) 0.196 % Reduction in Area: 79.2% % Reduction in Volume: 86.1% Epithelialization: Small (1-33%) Wound Description Classification: Full Thickness Without Exposed Support Structu Wound Margin: Distinct, outline attached Exudate Amount: Large Exudate Type: Serosanguineous Exudate Color: red, brown res Foul Odor After Cleansing: No Slough/Fibrino Yes Wound Bed Granulation Amount: Large (67-100%) Exposed  Structure Granulation Quality: Red Fascia Exposed: No Necrotic Amount: None Present (0%) Fat Layer (Subcutaneous Tissue) Exposed: Yes Tendon Exposed: No Muscle Exposed: No Joint Exposed: No Bone Exposed: No Treatment Notes Wound #2 (Right, Dorsal Foot) Notes Alginate on plantar, collagen other, abd and conform. Ralph Peterson, Ralph Peterson ([id number]) Electronic Signature(s) Signed: 07/13/2020 4:19:23 PM By: Georges Mouse, Minus Breeding RN Entered By: Georges Mouse, Minus Breeding on 07/13/2020 15:23:01 Ralph Peterson ([id number]) -------------------------------------------------------------------------------- Wound Assessment Details Patient Name: Ralph Peterson Date of Service: 07/13/2020 3:00 PM Medical Record Number: [id number] Patient Account Number: [id number] Date of Birth/Sex: 11/18/1965 (54 y.o. M) Treating RN: Dolan Amen Primary Care Lynnix Schoneman: Vidal Schwalbe Other Clinician: Referring Halbert Jesson: Vidal Schwalbe Treating Ayanni Tun/Extender: Skipper Cliche in Treatment: 11 Wound Status Wound Number: 5 Primary Diabetic Wound/Ulcer of the Lower Extremity Etiology: Wound Location: Right, Plantar Foot Wound Open Wounding Event: Surgical Injury Status: Date Acquired: 03/24/2020 Comorbid Arrhythmia, Congestive Heart Failure, Coronary Artery Weeks Of Treatment: 8 History: Disease, Hypertension, Myocardial  Infarction, Peripheral Clustered Wound: No Venous Disease, Type II Diabetes, Neuropathy Photos Photo Uploaded By: Georges Mouse, Minus Breeding on 07/13/2020 16:03:29 Wound Measurements Length: (cm) 0.5 Width: (cm) 0.1 Depth: (cm) 0.3 Area: (cm) 0.039 Volume: (cm) 0.012 % Reduction in Area: 94.2% % Reduction in Volume: 96.4% Epithelialization: None Wound Description Classification: Grade 1 Wound Margin: Distinct, outline attached Exudate Amount: Medium Exudate Type: Serosanguineous Exudate Color: red, brown Foul Odor After Cleansing: No Slough/Fibrino No Wound  Bed Granulation Amount: None Present (0%) Exposed Structure Necrotic Amount: None Present (0%) Fascia Exposed: No Fat Layer (Subcutaneous Tissue) Exposed: No Tendon Exposed: No Muscle Exposed: No Joint Exposed: No Bone Exposed: No Treatment Notes Wound #5 (Right, Plantar Foot) Notes Alginate on plantar, collagen other, abd and conform. Ralph Peterson, Ralph Peterson ([id number]) Electronic Signature(s) Signed: 07/13/2020 4:19:23 PM By: Georges Mouse, Minus Breeding RN Entered By: Georges Mouse, Minus Breeding on 07/13/2020 15:23:19 Ralph Peterson ([id number]) -------------------------------------------------------------------------------- Vitals Details Patient Name: Ralph Peterson Date of Service: 07/13/2020 3:00 PM Medical Record Number: [id number] Patient Account Number: [id number] Date of Birth/Sex: Dec 15, 1965 (54 y.o. M) Treating RN: Dolan Amen Primary Care Irma Roulhac: Vidal Schwalbe Other Clinician: Referring Sumedh Shinsato: Vidal Schwalbe Treating Lemma Tetro/Extender: Skipper Cliche in Treatment: 11 Vital Signs Time Taken: 15:09 Temperature (F): 98.8 Height (in): 72 Pulse (bpm): 73 Weight (lbs): 225 Respiratory Rate (breaths/min): 18 Body Mass Index (BMI): 30.5 Blood Pressure (mmHg): 125/81 Reference Range: 80 - 120 mg / dl Electronic Signature(s) Signed: 07/13/2020 4:19:23 PM By: Georges Mouse, Minus Breeding RN Entered By: Georges Mouse, Minus Breeding on 07/13/2020 15:10:04

## 2020-07-13 NOTE — Progress Notes (Signed)
Ralph Peterson, Ralph Peterson ([id number]) Visit Report for 07/13/2020 Chief Complaint Document Details Patient Name: Ralph Peterson, Ralph Peterson. Date of Service: 07/13/2020 3:00 PM Medical Record Number: [id number] Patient Account Number: [id number] Date of Birth/Sex: [birth year] (54 y.o. 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 M) Treating RN: Cornell Barman Primary Care Provider: Vidal Schwalbe Other Clinician: Referring Provider: Vidal Schwalbe Treating Provider/Extender: Skipper Cliche in Treatment: 11 Debridement Performed for Wound #1 Right,Lateral Foot Assessment: Performed By: Physician Tommie Sams., PA-C Debridement Type: Debridement Level of Consciousness (Pre- Awake and Alert procedure): Pre-procedure Verification/Time Out Yes - 15:32 Taken: Total Area Debrided (L x W): 2 (cm) x 1.5 (cm) = 3 (cm) Tissue and other material Viable, Non-Viable, Slough, Subcutaneous, Tendon, Slough debrided: Level: Skin/Subcutaneous Tissue/Muscle Debridement Description: Excisional Instrument: Curette Bleeding: Moderate Hemostasis Achieved: Pressure Response to Treatment: Procedure was tolerated well Level of Consciousness (Post- Awake and Alert procedure): Post Debridement  Measurements of Total Wound Length: (cm) 2 Stage: Category/Stage IV Width: (cm) 1.5 Depth: (cm) 0.3 Volume: (cm) 0.707 Character of Wound/Ulcer Post Debridement: Stable Post Procedure Diagnosis Same as Pre-procedure Electronic Signature(s) Signed: 07/13/2020 4:46:48 PM By: Worthy Keeler PA-C Entered By: Gretta Cool, BSN, RN, CWS, Kim on 07/13/2020 15:36:08 Ralph Peterson ([id number]) -------------------------------------------------------------------------------- HPI Details Patient Name: Ralph Peterson Date of Service: 07/13/2020 3:00 PM Medical Record Number: [id number] Patient Account Number: [id number] Date of Birth/Sex: 03/07/1966 (54 y.o. M) Treating RN: Cornell Barman Primary Care Provider: Vidal Schwalbe Other Clinician: Referring Provider: Vidal Schwalbe Treating Provider/Extender: Skipper Cliche in Treatment: 11 History of Present Illness HPI Description: He has been having with multiple ulcers of his feet. He does have a couple callus areas that have ulcerations underneath on the left foot. 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 Fortunately there is no sign of active infection at this time which is good news he has been using Telfa pads after applying Betadine this is not really absorbing enough of the drainage however which is causing the area especially at the amputation site to be very macerated. The patient states that he actually has a work-up with Dr. Delana [facility] at [facility] next Thursday. For that reason we did not perform ABIs today and again I really am not doing any significant debridement today we will discontinue mild things to remove some of the necrotic debris so that we hopefully get these wounds moving in the proper direction. With that being said his extremities do appear to be warm without any signs obviously of  significant arterial flow. The patient does have a history of diabetes mellitus type 2, congestive heart failure, end- stage renal disease, and dependence on renal dialysis. He notes that he did have an exacerbation of heart failure recently which caused his legs to swell he had a couple wounds on his left leg but fortunately these have  actually improved and are healed as of today. 05/09/2020 on evaluation today patient appears to be doing decently well all things considered in regard to his wounds. He does have the sutures out at this point in regard to his right amputation site. That does seem to be showing signs of improvement which is good news from the standpoint of at least being able to get some of the slough out although he does have quite a bit of slough buildup on the surface of the wound here. I think that is can require debridement before were able to see dramatic filling in. He may even require wound VAC at the site. With regard to the lateral foot this seems to be loosening up with the Santyl and I am pleased in that regard. In regard to the left foot everything appears to be healed and is doing great. 05/18/2020 on evaluation today patient appears to be doing well at this time in regard to his wounds. 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 There is no signs of active infection at this time. 05/26/2020 on evaluation today patient appears to be actually showing signs of improvement in general in regard to his wounds. The plantar aspect wound does connect to the dorsal surface wound which I initially expected but after last week was hopeful that that would not be the case. I was in fact hoping it would completely sealed up this week but it does not seem to  have happened. There is no signs of active infection at this time which is great news. With that being said he does still have tender noted on the lateral aspect of the foot where the wound is there but this does not seem to be nearly as slough covered in fact I was able to carefully clean this off today and other than the fact that we need to help seal up the tendon I do not see any signs of infection at this point. 06/02/2020 upon evaluation today patient actually is making excellent improvement in regard to all of his wounds. In fact the plantar aspect of his foot looks like it may have sealed up although I cannot be 193% certain in this regard. With that being said I do see where there was some drainage on his dressing but today I cannot find any real depth to the wound opening and it appears to be close to closed if not completely. Nonetheless I do think that the dorsal surface of the foot is doing excellent in fact we have gotten approval for snap VAC but I am not even sure that skin to be necessary he is healing so well already. In regard to the lateral foot I do feel like the collagen has been beneficial here and my recommendation is good to be that we likely continue as such with this. Tenderness still exposed but seems to be nice and moist I see no issues in that regard. 06/09/2020 on evaluation today patient appears to be doing pretty well in regard to his foot ulcers although the plantar ulcer does appear to be deeper than what I was able to measure last week that is unfortunate. The lateral foot seems to be doing okay. Fortunately there is no signs of active infection at this time.  How are in regard to the blister on the lower leg on the right that opened last time this appears to be erythematous around and I feel like it is also warm to touch I am concerned about infection here. 06/13/2020 on evaluation today patient appears to be doing excellent in regard to his lateral foot wound is  not measuring a lot smaller but it does look like it is fairly healthy at this time. His culture did show no signs of growth at this point which is good news. With that being said he has been taking the antibiotic and does feel like that has helped his leg is feeling better. This is good news. There are signs of new epithelial growth as well. With regard to his plantar foot this still shows signs of some depth its really slowly closing up and again I am not to concerned but and I cannot find where it directly connects to the area above but there may be some small connections. Either way I think that potentially the goal would be to try the snap VAC today to see if this could be beneficial I think it may help to granulate in the upper wound and if we do that may be the plantar wound will show signs of improvement a little bit more sufficiently. 06/20/2020 upon evaluation today patient appears to be doing decently well in regard to his leg wound this is good news. Unfortunately the lateral foot wound seems to be somewhat dry at this point I think we continue to go back to the [place] that seem to be doing better for him. With that being said with regard to the snap VAC on the dorsal surface of his foot it apparently did not seal well and continued to leak unfortunately. He had a lot of issues and never was able to get it to function properly since we put this on last week. Nonetheless I did advise that we could give this another try putting on a little bit differently that may allow this to be able to function appropriately. 06/27/2020 upon evaluation today patient appears to be doing very well in regard to his left foot dorsal wound. This is actually showing signs of excellent improvement and overall seems to be managing quite nicely in my opinion. With that being said I do feel like that the plantar aspect of his foot is a little bit more significant at this time he still has some depth and we really  have not gotten this to feeling very well at all. The lateral portion of the foot the Santyl does seem to be helping clear away and the leg is going require some debridement to remove some of the dead tissue and skin from around the edges of the wound but overall seems to be doing much better I think that is very close to complete closure. 07/07/2020 upon evaluation today patient appears to be doing well with regard to his wounds in general. The plantar foot ulcer is actually the most concerning that of anything worsening at this time. I do believe that he could potentially benefit from a continuation of packing in the plantar foot in order to keep this open and draining I do not want it to seal up and cause any trouble. With regard to the leg this is healed with regard to Ralph Peterson, Ralph Peterson. ([id number]) the lateral portion of his foot I think that is actually doing better there were still making some slow progress here the top  of his foot looks excellent. 07/13/2020 upon evaluation today patient appears to be doing well currently with regard to his wounds in general. The best news is that the plantar wound actually seems to be showing signs of good epithelization and granulation there does not appear to be any depth like it was even last week and very pleased in this regard. The dorsal foot wound is measuring significantly smaller doing great and subsequently the patient also states that the lateral foot seems to be doing quite well. All in all I am very pleased with how things are progressing I think that he is making great progress towards completion of treatment. Electronic Signature(s) Signed: 07/13/2020 3:41:42 PM By: Worthy Keeler PA-C Entered By: Worthy Keeler on 07/13/2020 15:41:42 Ralph Peterson ([id number]) -------------------------------------------------------------------------------- Physical Exam Details Patient Name: Ralph Peterson Date of Service:  07/13/2020 3:00 PM Medical Record Number: [id number] Patient Account Number: [id number] Date of Birth/Sex: 1966-04-07 (54 y.o. M) Treating RN: Cornell Barman Primary Care Provider: Vidal Schwalbe Other Clinician: Referring Provider: Vidal Schwalbe Treating Provider/Extender: Skipper Cliche in Treatment: 61 Constitutional Well-nourished and well-hydrated in no acute distress. Respiratory normal breathing without difficulty. Psychiatric this patient is able to make decisions and demonstrates good insight into disease process. Alert and Oriented x 3. pleasant and cooperative. Notes On inspection patient's wound bed actually showed signs of good granulation at this time at all wound locations. There is also good epithelial growth and even the lateral portion of the foot seems to be doing well the plantar foot seems to have almost completely sealed over. I am very pleased with where things stand today. Electronic Signature(s) Signed: 07/13/2020 3:42:10 PM By: Worthy Keeler PA-C Entered By: Worthy Keeler on 07/13/2020 15:42:10 Ralph Peterson ([id number]) -------------------------------------------------------------------------------- Physician Orders Details Patient Name: Ralph Peterson Date of Service: 07/13/2020 3:00 PM Medical Record Number: [id number] Patient Account Number: [id number] Date of Birth/Sex: March 05, 1966 (54 y.o. M) Treating RN: Dolan Amen Primary Care Provider: Vidal Schwalbe Other Clinician: Referring Provider: Vidal Schwalbe Treating Provider/Extender: Skipper Cliche in Treatment: 11 Verbal / Phone Orders: No Diagnosis Coding ICD-10 Coding Code Description [phone] Pressure ulcer of other site, stage 3 T81.31XA Disruption of external operation (surgical) wound, not elsewhere classified, initial encounter 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d L97.812 Non-pressure chronic ulcer of other part of right  lower leg with fat layer exposed E11.621 Type 2 diabetes mellitus with foot ulcer I50.42 Chronic combined systolic (congestive) and diastolic (congestive) heart failure N18.6 End stage renal disease Z99.2 Dependence on renal dialysis Wound Cleansing Wound #1 Right,Lateral Foot o Clean wound with Normal Saline. Wound #2 Right,Dorsal Foot o Clean wound with Normal Saline. Wound #5 Right,Plantar Foot o Clean wound with Normal Saline. Anesthetic (add to Medication List) Wound #1 Right,Lateral Foot o Topical Lidocaine 4% cream applied to wound bed prior to debridement (In Clinic Only). Wound #2 Right,Dorsal Foot o Topical Lidocaine 4% cream applied to wound bed prior to debridement (In Clinic Only). Wound #5 Right,Plantar Foot o Topical Lidocaine 4% cream applied to wound bed prior to debridement (In Clinic Only). Primary Wound Dressing Wound #1 Right,Lateral Foot o Silver Collagen Wound #2 Right,Dorsal Foot o Silver Collagen Wound #5 Right,Plantar Foot o Silver Alginate Secondary Dressing Wound #1 Right,Lateral Foot o ABD and Kerlix/Conform Wound #2 Right,Dorsal Foot o Boardered Foam Dressing Wound #5 Right,Plantar Foot o ABD and Kerlix/Conform Dressing Change Frequency Wound #1 Right,Lateral Foot Lindsley, Norvel  S. ([id number]) o Change dressing every day. Wound #2 Right,Dorsal Foot o Three times weekly Wound #5 Right,Plantar Foot o Change dressing every day. Follow-up Appointments Wound #1 Right,Lateral Foot o Return Appointment in 1 week. Wound #2 Right,Dorsal Foot o Return Appointment in 1 week. Wound #5 Right,Plantar Foot o Return Appointment in 1 week. Edema Control Wound #1 Right,Lateral Foot o Elevate legs to the level of the heart and pump ankles as often as possible - Tubigrip E Wound #2 Right,Dorsal Foot o Elevate legs to the level of the heart and pump ankles as often as possible - Tubigrip E Wound #5  Right,Plantar Foot o Elevate legs to the level of the heart and pump ankles as often as possible - Tubigrip E Off-Loading Wound #1 Right,Lateral Foot o Other: - front off-loader right Wound #2 Right,Dorsal Foot o Other: - front off-loader right Wound #5 Right,Plantar Foot o Other: - front off-loader right Additional Orders / Instructions Wound #1 Right,Lateral Foot o Increase protein intake. Wound #2 Right,Dorsal Foot o Increase protein intake. Wound #5 Right,Plantar Foot o Increase protein intake. Electronic Signature(s) Signed: 07/13/2020 4:19:23 PM By: Georges Mouse, Minus Breeding RN Signed: 07/13/2020 4:46:48 PM By: Worthy Keeler PA-C Entered By: Georges Mouse, Minus Breeding on 07/13/2020 15:33:40 Ralph Peterson ([id number]) -------------------------------------------------------------------------------- Problem List Details Patient Name: Ralph Peterson Date of Service: 07/13/2020 3:00 PM Medical Record Number: [id number] Patient Account Number: [id number] Date of Birth/Sex: 07-Feb-1966 (54 y.o. M) Treating RN: Cornell Barman Primary Care Provider: Vidal Schwalbe Other Clinician: Referring Provider: Vidal Schwalbe Treating Provider/Extender: Skipper Cliche in Treatment: 11 Active Problems ICD-10 Encounter Code Description Active Date MDM Diagnosis L89.893 Pressure ulcer of other site, stage 3 04/27/2020 No Yes T81.31XA Disruption of external operation (surgical) wound, not elsewhere 04/27/2020 No Yes classified, initial encounter L97.512 Non-pressure chronic ulcer of other part of right foot with fat layer 04/27/2020 No Yes exposed L97.812 Non-pressure chronic ulcer of other part of right lower leg with fat layer 04/27/2020 No Yes exposed E11.621 Type 2 diabetes mellitus with foot ulcer 04/27/2020 No Yes I50.42 Chronic combined systolic (congestive) and diastolic (congestive) heart 04/27/2020 No Yes failure N18.6 End stage renal disease 04/27/2020 No  Yes Z99.2 Dependence on renal dialysis 04/27/2020 No Yes Inactive Problems Resolved Problems Electronic Signature(s) Signed: 07/13/2020 3:04:16 PM By: Worthy Keeler PA-C Entered By: Worthy Keeler on 07/13/2020 15:04:16 Ralph Peterson ([id number]) -------------------------------------------------------------------------------- Progress Note Details Patient Name: Ralph Peterson Date of Service: 07/13/2020 3:00 PM Medical Record Number: [id number] Patient Account Number: [id number] Date of Birth/Sex: Sep 06, 1965 (54 y.o. 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 He does have a couple callus areas that have ulcerations underneath on the left foot. 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 Fortunately there is no sign of active infection at this time which is good news he has been using Telfa pads after applying Betadine this is not really absorbing enough of the drainage however which is causing the area especially at the amputation site to be very macerated. The patient states that he  actually has a work-up with Dr. Delana Meyer at [facility] next Thursday. For that reason we did not perform ABIs today and again I really am not doing any significant debridement today we will discontinue mild things to remove some of the necrotic debris so that we hopefully get these wounds moving in the proper direction. With that being said his extremities do appear to be warm without any signs  obviously of significant arterial flow. The patient does have a history of diabetes mellitus type 2, congestive heart failure, end- stage renal disease, and dependence on renal dialysis. He notes that he did have an exacerbation of heart failure recently which caused his legs to swell he had a couple wounds on his left leg but fortunately these have actually improved and are healed as of today. 05/09/2020 on evaluation today patient appears to be doing decently well all things considered in regard to his wounds. He does have the sutures out at this point in regard to his right amputation site. That does seem to be showing signs of improvement which is good news from the standpoint of at least being able to get some of the slough out although he does have quite a bit of slough buildup on the surface of the wound here. I think that is can require debridement before were able to see dramatic filling in. He may even require wound VAC at the site. With regard to the lateral foot this seems to be loosening up with the Santyl and I am pleased in that regard. In regard to the left foot everything appears to be healed and is doing great. 05/18/2020 on evaluation today patient appears to be doing well at this time in regard to his wounds. 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 There is no signs of active infection at this time. 05/26/2020 on evaluation today patient appears to be actually showing signs of improvement in general in regard to his wounds. The plantar aspect wound does connect to the dorsal surface wound which I initially expected but after last week was hopeful that that would not be the case. I was in fact hoping it would completely sealed up this week but it does  not seem to have happened. There is no signs of active infection at this time which is great news. With that being said he does still have tender noted on the lateral aspect of the foot where the wound is there but this does not seem to be nearly as slough covered in fact I was able to carefully clean this off today and other than the fact that we need to help seal up the tendon I do not see any signs of infection at this point. 06/02/2020 upon evaluation today patient actually is making excellent improvement in regard to all of his wounds. In fact the plantar aspect of his foot looks like it may have sealed up although I cannot be 932% certain in this regard. With that being said I do see where there was some drainage on his dressing but today I cannot find any real depth to the wound opening and it appears to be close to closed if not completely. Nonetheless I do think that the dorsal surface of the foot is doing excellent  in fact we have gotten approval for snap VAC but I am not even sure that skin to be necessary he is healing so well already. In regard to the lateral foot I do feel like the collagen has been beneficial here and my recommendation is good to be that we likely continue as such with this. Tenderness still exposed but seems to be nice and moist I see no issues in that regard. 06/09/2020 on evaluation today patient appears to be doing pretty well in regard to his foot ulcers although the plantar ulcer does appear to be deeper than what I was able to measure last week that is unfortunate. The lateral foot seems to be doing okay. Fortunately there is no signs of active infection at this time. How are in regard to the blister on the lower leg on the right that opened last time this appears to be erythematous around and I feel like it is also warm to touch I am concerned about infection here. 06/13/2020 on evaluation today patient appears to be doing excellent in regard to his lateral foot  wound is not measuring a lot smaller but it does look like it is fairly healthy at this time. His culture did show no signs of growth at this point which is good news. With that being said he has been taking the antibiotic and does feel like that has helped his leg is feeling better. This is good news. There are signs of new epithelial growth as well. With regard to his plantar foot this still shows signs of some depth its really slowly closing up and again I am not to concerned but and I cannot find where it directly connects to the area above but there may be some small connections. Either way I think that potentially the goal would be to try the snap VAC today to see if this could be beneficial I think it may help to granulate in the upper wound and if we do that may be the plantar wound will show signs of improvement a little bit more sufficiently. 06/20/2020 upon evaluation today patient appears to be doing decently well in regard to his leg wound this is good news. Unfortunately the lateral foot wound seems to be somewhat dry at this point I think we continue to go back to the [place] that seem to be doing better for him. With that being said with regard to the snap VAC on the dorsal surface of his foot it apparently did not seal well and continued to leak unfortunately. He had a lot of issues and never was able to get it to function properly since we put this on last week. Nonetheless I did advise that we could give this another try putting on a little bit differently that may allow this to be able to function appropriately. 06/27/2020 upon evaluation today patient appears to be doing very well in regard to his left foot dorsal wound. This is actually showing signs of excellent improvement and overall seems to be managing quite nicely in my opinion. With that being said I do feel like that the plantar aspect of his foot is a little bit more significant at this time he still has some depth and we  really have not gotten this to feeling very well at all. The lateral portion of the foot the Santyl does seem to be helping clear away and the leg is going require some debridement to remove some of the dead Ralph Peterson, Ralph Peterson. ([id number])  tissue and skin from around the edges of the wound but overall seems to be doing much better I think that is very close to complete closure. 07/07/2020 upon evaluation today patient appears to be doing well with regard to his wounds in general. The plantar foot ulcer is actually the most concerning that of anything worsening at this time. I do believe that he could potentially benefit from a continuation of packing in the plantar foot in order to keep this open and draining I do not want it to seal up and cause any trouble. With regard to the leg this is healed with regard to the lateral portion of his foot I think that is actually doing better there were still making some slow progress here the top of his foot looks excellent. 07/13/2020 upon evaluation today patient appears to be doing well currently with regard to his wounds in general. The best news is that the plantar wound actually seems to be showing signs of good epithelization and granulation there does not appear to be any depth like it was even last week and very pleased in this regard. The dorsal foot wound is measuring significantly smaller doing great and subsequently the patient also states that the lateral foot seems to be doing quite well. All in all I am very pleased with how things are progressing I think that he is making great progress towards completion of treatment. Objective Constitutional Well-nourished and well-hydrated in no acute distress. Vitals Time Taken: 3:09 PM, Height: 72 in, Weight: 225 lbs, BMI: 30.5, Temperature: 98.8 F, Pulse: 73 bpm, Respiratory Rate: 18 breaths/min, Blood Pressure: 125/81 mmHg. Respiratory normal breathing without difficulty. Psychiatric this  patient is able to make decisions and demonstrates good insight into disease process. Alert and Oriented x 3. pleasant and cooperative. General Notes: On inspection patient's wound bed actually showed signs of good granulation at this time at all wound locations. There is also good epithelial growth and even the lateral portion of the foot seems to be doing well the plantar foot seems to have almost completely sealed over. I am very pleased with where things stand today. Integumentary (Hair, Skin) Wound #1 status is Open. Original cause of wound was Pressure Injury. The wound is located on the Right,Lateral Foot. The wound measures 2cm length x 1.5cm width x 0.2cm depth; 2.356cm^2 area and 0.471cm^3 volume. There is joint, tendon, and Fat Layer (Subcutaneous Tissue) exposed. There is a medium amount of serosanguineous drainage noted. The wound margin is distinct with the outline attached to the wound base. There is large (67-100%) red granulation within the wound bed. There is a small (1-33%) amount of necrotic tissue within the wound bed including Adherent Slough. Wound #2 status is Open. Original cause of wound was Surgical Injury. The wound is located on the Right,Dorsal Foot. The wound measures 2.5cm length x 0.5cm width x 0.2cm depth; 0.982cm^2 area and 0.196cm^3 volume. There is Fat Layer (Subcutaneous Tissue) exposed. There is a large amount of serosanguineous drainage noted. The wound margin is distinct with the outline attached to the wound base. There is large (67-100%) red granulation within the wound bed. There is no necrotic tissue within the wound bed. Wound #5 status is Open. Original cause of wound was Surgical Injury. The wound is located on the [place]. The wound measures 0.5cm length x 0.1cm width x 0.3cm depth; 0.039cm^2 area and 0.012cm^3 volume. There is a medium amount of serosanguineous drainage noted. The wound margin is distinct with the outline  attached to the  wound base. There is no granulation within the wound bed. There is no necrotic tissue within the wound bed. 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 ([id number]) Procedures Wound #1 Pre-procedure diagnosis of Wound #1 is a Pressure Ulcer located on the Right,Lateral Foot . There was a Excisional Skin/Subcutaneous Tissue/Muscle Debridement with a total area of 3 sq cm performed by Tommie Sams., PA-C. With the following instrument(s): Curette to remove Viable and Non-Viable tissue/material. Material removed includes Tendon, Subcutaneous Tissue, and Slough. No specimens were taken. A time out was conducted at 15:32, prior to the start of the procedure. A Moderate amount of bleeding was controlled with Pressure. The procedure was tolerated well. Post Debridement Measurements: 2cm length x 1.5cm width x 0.3cm depth; 0.707cm^3 volume. Post debridement Stage noted as Category/Stage IV. Character of Wound/Ulcer Post Debridement is stable. Post procedure Diagnosis Wound #1: Same as Pre-Procedure Plan Wound Cleansing: Wound #1 Right,Lateral Foot: Clean wound with Normal Saline. Wound #2 Right,Dorsal Foot: Clean wound with Normal Saline. Wound #5 Right,Plantar Foot: Clean wound with Normal Saline. Anesthetic (add to Medication List): Wound #1 Right,Lateral Foot: Topical Lidocaine 4% cream applied to wound bed prior to debridement (In Clinic Only). Wound #2 Right,Dorsal Foot: Topical Lidocaine 4% cream applied to  wound bed prior to debridement (In Clinic Only). Wound #5 Right,Plantar Foot: Topical Lidocaine 4% cream applied to wound bed prior to debridement (In Clinic Only). Primary Wound Dressing: Wound #1 Right,Lateral Foot: Silver Collagen Wound #2 Right,Dorsal Foot: Silver Collagen Wound #5 Right,Plantar Foot: Silver Alginate Secondary Dressing: Wound #1 Right,Lateral Foot: ABD and Kerlix/Conform Wound #2 Right,Dorsal Foot: Boardered Foam Dressing Wound #5 Right,Plantar Foot: ABD and Kerlix/Conform Dressing Change Frequency: Wound #1 Right,Lateral Foot: Change dressing every day. Wound #2 Right,Dorsal Foot: Three times weekly Wound #5 Right,Plantar Foot: Change dressing every day. Follow-up Appointments: Wound #1 Right,Lateral Foot: Return Appointment in 1 week. Wound #2 Right,Dorsal Foot: Return Appointment in 1 week. Wound #5 Right,Plantar Foot: Return Appointment in 1 week. Edema Control: Wound #1 Right,Lateral Foot: Elevate legs to the level of the heart and pump ankles as often as possible - Tubigrip E Wound #2 Right,Dorsal Foot: Elevate legs to the level of the heart and pump ankles as often as possible - Tubigrip E Wound #5 Right,Plantar Foot: Elevate legs to the level of the heart and pump ankles as often as possible - Tubigrip E Off-Loading: Wound #1 Right,Lateral Foot: Other: - front off-loader right Wound #2 Right,Dorsal Foot: Other: - front off-loader right Wound #5 Right,Plantar Foot: Ralph Peterson, Ralph S. ([id number]) Other: - front off-loader right Additional Orders / Instructions: Wound #1 Right,Lateral Foot: Increase protein intake. Wound #2 Right,Dorsal Foot: Increase protein intake. Wound #5 Right,Plantar Foot: Increase protein intake. 1. Would recommend currently that we going to continue with the wound care measures as before and the patient is in agreement with the plan. This includes the silver alginate to the plantar foot which is what they  have been using since they can get the [facility] again. Subsequently I think this is a good option. 2. I am  also can recommend the patient continue with collagen to the dorsal foot and we can add that to the lateral foot as well I think that is ready for it at this point I do not believe the Santyl is necessary any longer. 3. I would recommend he continue to elevate his leg if he has any swelling although I do not see much in the way of swelling at this point. We will see patient back for reevaluation in 2 weeks here in the clinic. If anything worsens or changes patient will contact our office for additional recommendations. Electronic Signature(s) Signed: 07/13/2020 3:42:54 PM By: Worthy Keeler PA-C Entered By: Worthy Keeler on 07/13/2020 15:42:54 Ralph Peterson ([id number]) -------------------------------------------------------------------------------- SuperBill Details Patient Name: Ralph Peterson Date of Service: 07/13/2020 Medical Record Number: [id number] Patient Account Number: [id number] Date of Birth/Sex: 1966-05-12 (54 y.o. M) Treating RN: Cornell Barman Primary Care Provider: Vidal Schwalbe Other Clinician: Referring Provider: Vidal Schwalbe Treating Provider/Extender: Skipper Cliche in Treatment: 11 Diagnosis Coding ICD-10 Codes Code Description [phone] Pressure ulcer of other site, stage 3 T81.31XA Disruption of external operation (surgical) wound, not elsewhere classified, initial encounter 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d L97.812 Non-pressure chronic ulcer of other part of right lower leg with fat layer exposed E11.621 Type 2 diabetes mellitus with foot ulcer I50.42 Chronic combined systolic (congestive) and diastolic (congestive) heart failure N18.6 End stage renal disease Z99.2 Dependence on renal dialysis Facility Procedures CPT4 Code: 79150569 Description: 79480 - DEB MUSC/FASCIA 20 SQ  CM/< Modifier: Quantity: 1 CPT4 Code: Description: ICD-10 Diagnosis Description L97.512 Non-pressure chronic ulcer of other part of right foot with fat layer exp Modifier: osed Quantity: Physician Procedures CPT4 Code: 1655374 Description: 11043 - WC PHYS DEBR MUSCLE/FASCIA 20 SQ CM Modifier: Quantity: 1 CPT4 Code: Description: ICD-10 Diagnosis Description 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 Modifier: sed Quantity: Electronic Signature(s) Signed: 07/13/2020 3:43:33 PM By: Worthy Keeler PA-C Entered By: Worthy Keeler on 07/13/2020 15:43:33

## 2020-07-20 ENCOUNTER — Ambulatory Visit: Payer: Medicare PPO

## 2020-07-25 ENCOUNTER — Other Ambulatory Visit: Payer: Self-pay

## 2020-07-25 ENCOUNTER — Ambulatory Visit
Admission: RE | Admit: 2020-07-25 | Discharge: 2020-07-25 | Disposition: A | Discharge status: Home / Self Care | Payer: Medicare PPO | Source: Ambulatory Visit | Attending: Specialist | Admitting: Specialist

## 2020-07-25 DIAGNOSIS — R0602 Shortness of breath: Secondary | ICD-10-CM | POA: Diagnosis present

## 2020-07-27 ENCOUNTER — Ambulatory Visit: Payer: Medicare PPO | Admitting: Physician Assistant

## 2020-08-03 ENCOUNTER — Ambulatory Visit: Payer: Medicare PPO | Admitting: Physician Assistant

## 2020-08-04 ENCOUNTER — Telehealth: Payer: Self-pay | Admitting: Family

## 2020-08-04 NOTE — Telephone Encounter (Signed)
Left VM in attempt to reschedule a no show appointment from December. Asked patient to give [place] a call back to get it rescheduled.    Museum/gallery conservator. NT

## 2020-08-07 DIAGNOSIS — I70219 Atherosclerosis of native arteries of extremities with intermittent claudication, unspecified extremity: Secondary | ICD-10-CM | POA: Insufficient documentation

## 2020-08-07 NOTE — Progress Notes (Signed)
MRN : [id number]  Ralph Peterson is a 55 y.o. (06/15/1966) male who presents with chief complaint of No chief complaint on file. Marland Kitchen  History of Present Illness:   The patient returns to the office for followup and review of the noninvasive studies. There has been a significant deterioration in the lower extremity symptoms.  The patient notes interval shortening of their claudication distance and development of mild rest pain symptoms. Previous ulcer has not improved.  There have been no significant changes to the patient's overall health care.  The patient denies amaurosis fugax or recent TIA symptoms. There are no recent neurological changes noted. The patient denies history of DVT, PE or superficial thrombophlebitis. The patient denies recent episodes of angina or shortness of breath.   ABI's Rt=[place] and Lt=[place] (previous ABI's Rt=[place] and Lt=[place]) Duplex US of the bilateral lower extremity arterial system shows patent SFA and popliteal arteries bilaterally with monophasic flow throughout  No outpatient medications have been marked as taking for the 08/10/20 encounter (Appointment) with Delana Meyer, Dolores Lory, MD.    Past Medical History:  Diagnosis Date  . Anxiety   . Arrhythmia    atrial fibrillation  . CAD (coronary artery disease)    a. 09/2010 Cath/PCI ([facility]): LM nl, LAD 94m D1 80 (small), LCX 375mOM1 30, RI 70 (small), RCA 70 (DES).  . CHF (congestive heart failure) ([facility]  . Coronary artery disease   . COVID-19 virus infection 07/2019  . Diabetes mellitus without complication ([facility] [place]  . ESRD (end stage renal disease) ([facility]  . Hyperlipidemia   . Hypertension   . Ischemic cardiomyopathy    a.  12/2011 Echo ([facility]): NL EF, mod LVH. Mild AS/MS, triv PR/TR. 07/2019 Echo: EF 25-30%, GR1 DD, inf/post HK, low nl RV fxn, mildly dil RA, triv MR, mild Ao sclerosis w/o stenosis.  . NSTEMI (non-ST elevated myocardial infarction) ([facility]/2021  . PAD (peripheral artery disease) ([facility]    a. 03/2016 s/p PTA/DEB R SFA/popliteal/peroneal; b. 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 08/2017 Atherectomy/DEB dist L SFA/popliteal. PTA of L AT; d. 06/2018 PTA/DEB L SFA/popliteal/PT/AT; e. 01/2019 Stable ABIs.  . Sleep apnea     Past Surgical History:  Procedure Laterality Date  . ABDOMINAL AORTOGRAM N/A 09/10/2017   Procedure: ABDOMINAL AORTOGRAM;  Surgeon: ArWellington HampshireMD;  [place];  Service: Cardiovascular;  Laterality: N/A;  . AMPUTATION TOE Bilateral    one toe on right, all five toes on the left  . CARDIAC [facility]ATION  [facility] X1 stent 2.5 x 33 mm xience stent Distal TCA  . LOWER EXTREMITY ANGIOGRAPHY Bilateral 09/10/2017   Procedure: Lower Extremity Angiography;  Surgeon: ArWellington HampshireMD;  [facility];  Service: Cardiovascular;  Laterality: Bilateral;  . LOWER EXTREMITY ANGIOGRAPHY Left 06/23/2018   Procedure: LOWER EXTREMITY ANGIOGRAPHY;  Surgeon: ScKatha CabalMD;  [facility];  Service: Cardiovascular;  Laterality: Left;  . LOWER EXTREMITY ANGIOGRAPHY Right 02/01/2020   Procedure: LOWER EXTREMITY ANGIOGRAPHY;  Surgeon: ScKatha CabalMD;  [facility];  Service: Cardiovascular;  Laterality: Right;  . PERIPHERAL VASCULAR ATHERECTOMY Left 09/10/2017   Procedure: PERIPHERAL VASCULAR ATHERECTOMY;  Surgeon: ArWellington HampshireMD;  [facility];  Service: Cardiovascular;  Laterality: Left;  SFA/POPLITEAL  . PERIPHERAL VASCULAR BALLOON ANGIOPLASTY Left 09/10/2017   Procedure: PERIPHERAL VASCULAR BALLOON ANGIOPLASTY;  Surgeon: ArFletcher Anon  Mertie Clause, MD;  Location: [place] CV LAB;  Service: Cardiovascular;  Laterality: Left;  ANTERIAL TIBIAL  . PERIPHERAL VASCULAR CATHETERIZATION Right 03/26/2016   Procedure: Lower Extremity Angiography;  Surgeon: Katha Cabal, MD;  Location: [place] CV LAB;  Service: Cardiovascular;  Laterality: Right;  . PERIPHERAL  VASCULAR INTERVENTION Left 09/10/2017   Procedure: PERIPHERAL VASCULAR INTERVENTION;  Surgeon: Wellington Hampshire, MD;  Location: [place] [facility] LAB;  Service: Cardiovascular;  Laterality: Left;  SFA/POPLITEAL    Social History Social History   Tobacco Use  . Smoking status: Former Smoker    Types: Cigarettes    Quit date: 2010    Years since quitting: 12.0  . Smokeless tobacco: Never Used  Vaping Use  . Vaping Use: Never used  Substance Use Topics  . Alcohol use: Not Currently  . Drug use: No    Family History Family History  Problem Relation Age of Onset  . Hypertension Mother     Allergies  Allergen Reactions  . Oxycodone-Acetaminophen Itching  . Gabapentin Other (See Comments)  . Tramadol Other (See Comments)  . Penicillins Rash    Has patient had a PCN reaction causing immediate rash, facial/tongue/throat swelling, SOB or lightheadedness with hypotension: Yes Has patient had a PCN reaction causing severe rash involving mucus membranes or skin necrosis: Yes Has patient had a PCN reaction that required hospitalization: No Has patient had a PCN reaction occurring within the last 10 years: No If all of the above answers are "NO", then may proceed with Cephalosporin use.      REVIEW OF SYSTEMS (Negative unless checked)  Constitutional: '[]'$ Weight loss  '[]'$ Fever  '[]'$ Chills Cardiac: '[]'$ Chest pain   '[]'$ Chest pressure   '[]'$ Palpitations   '[]'$ Shortness of breath when laying flat   '[]'$ Shortness of breath with exertion. Vascular:  '[x]'$ Pain in legs with walking   '[x]'$ Pain in legs at rest  '[]'$ History of DVT   '[]'$ Phlebitis   '[]'$ Swelling in legs   '[]'$ Varicose veins   '[x]'$ Non-healing ulcers Pulmonary:   '[]'$ Uses home oxygen   '[]'$ Productive cough   '[]'$ Hemoptysis   '[]'$ Wheeze  '[x]'$ COPD   '[]'$ Asthma Neurologic:  '[]'$ Dizziness   '[]'$ Seizures   '[]'$ History of stroke   '[]'$ History of TIA  '[]'$ Aphasia   '[]'$ Vissual changes   '[]'$ Weakness or numbness in arm   '[]'$ Weakness or numbness in leg Musculoskeletal:   '[]'$ Joint swelling    '[]'$ Joint pain   '[]'$ Low back pain Hematologic:  '[]'$ Easy bruising  '[]'$ Easy bleeding   '[]'$ Hypercoagulable state   '[]'$ Anemic Gastrointestinal:  '[]'$ Diarrhea   '[]'$ Vomiting  '[]'$ Gastroesophageal reflux/heartburn   '[]'$ Difficulty swallowing. Genitourinary:  '[x]'$ Chronic kidney disease   '[]'$ Difficult urination  '[]'$ Frequent urination   '[]'$ Blood in urine Skin:  '[]'$ Rashes   '[]'$ Ulcers  Psychological:  '[]'$ History of anxiety   '[]'$  History of major depression.  Physical Examination  There were no vitals filed for this visit. There is no height or weight on file to calculate BMI. Gen: WD/WN, NAD Head: [place]/AT, No temporalis wasting.  Ear/Nose/Throat: Hearing grossly intact, nares w/o erythema or drainage Eyes: PER, EOMI, sclera nonicteric.  Neck: Supple, no large masses.   Pulmonary:  Good air movement, no audible wheezing bilaterally, no use of accessory muscles.  Cardiac: RRR, no JVD Vascular: right av fistula good thrill good bruit, right foot ulcer noninfected Vessel Right Left  Radial Palpable Palpable  PT Not Palpable Not Palpable  DP Not Palpable Not Palpable  Gastrointestinal: Non-distended. No guarding/no peritoneal signs.  Musculoskeletal: M/S 5/5 throughout.  No deformity or atrophy.  Neurologic: CN 2-12 intact. Symmetrical.  Speech is fluent. Motor exam as listed above. Psychiatric: Judgment intact, Mood & affect appropriate for pt's clinical situation. Dermatologic: No rashes + ulcers noted.  No changes consistent with cellulitis.   CBC Lab Results  Component Value Date   WBC 13.5 (H) 03/10/2020   HGB 10.8 (L) 03/10/2020   HCT 34.2 (L) 03/10/2020   MCV 91.4 03/10/2020   PLT 167 03/10/2020    BMET    Component Value Date/Time   NA 129 (L) 03/10/2020 0440   NA 139 03/19/2016 1423   K 4.2 03/10/2020 0440   CL 88 (L) 03/10/2020 0440   CO2 28 03/10/2020 0440   GLUCOSE 114 (H) 03/10/2020 0440   BUN 62 (H) 03/10/2020 0440   BUN 33 (H) 03/19/2016 1423   CREATININE 6.98 (H) 03/10/2020 0440   CALCIUM  8.0 (L) 03/10/2020 0440   GFRNONAA 8 (L) 03/10/2020 0440   GFRAA 10 (L) 03/10/2020 0440   CrCl cannot be calculated (Patient's most recent lab result is older than the maximum 21 days allowed.).  COAG Lab Results  Component Value Date   INR 1.5 (H) 03/07/2020   INR 1.2 07/28/2019   INR 1.16 09/05/2017    Radiology DG Sniff Test  Result Date: 07/25/2020 CLINICAL DATA:  Shortness of breath EXAM: CHEST FLUOROSCOPY TECHNIQUE: Real-time fluoroscopic evaluation of the chest was performed. FLUOROSCOPY TIME:  Fluoroscopy Time:  42 seconds Radiation Exposure Index (if provided by the fluoroscopic device): 5.4 mGy Number of Acquired Spot Images: Cine fluoroscopic runs COMPARISON:  03/07/2020 FINDINGS: Mild elevation of the right hemidiaphragm is noted stable from the prior examination. Deep inspiration shows normal motion of the left hemidiaphragm with very minimal motion of the right hemidiaphragm. Sniff test shows minimal motion on the right with more normal appearing excursion of the diaphragm on the left. IMPRESSION: Significant decreased function of the right hemidiaphragm. No paradoxical motion is noted. Electronically Signed   By: Inez Catalina M.D.   On: 07/25/2020 12:06     Assessment/Plan 1. Atherosclerosis of native arteries of the extremities with ulceration ([place])  Recommend:  The patient has evidence of severe atherosclerotic changes of both lower extremities associated with ulceration and tissue loss of the right foot.  This represents a limb threatening ischemia and places the patient at the risk for limb loss.  Patient should undergo angiography of the right lower extremities with the hope for intervention for limb salvage.  The risks and benefits as well as the alternative therapies was discussed in detail with the patient.  All questions were answered.  Patient agrees to proceed with right leg angiography.  The patient will follow up with me in the office after the procedure.     2. Complication of vascular access for dialysis, sequela Recommend:  The patient is doing well and currently has adequate dialysis access. The patient's dialysis center is not reporting any access issues. Flow pattern is stable when compared to the prior ultrasound.  The patient should have a duplex ultrasound of the dialysis access in 6 months.  The patient will follow-up with me in the office after each ultrasound     3. ESRD (end stage renal disease) ([place]) At the present time the patient has adequate dialysis access.  Continue hemodialysis as ordered without interruption.  Avoid nephrotoxic medications and dehydration.  Further plans per nephrology  4. Coronary artery disease of native artery of native heart with stable angina pectoris (HCC) Continue cardiac and antihypertensive medications  as already ordered and reviewed, no changes at this time.  Continue statin as ordered and reviewed, no changes at this time  Nitrates PRN for chest pain   5. Primary hypertension Continue antihypertensive medications as already ordered, these medications have been reviewed and there are no changes at this time.   6. Atrial fibrillation with RVR (HCC) Continue antiarrhythmia medications as already ordered, these medications have been reviewed and there are no changes at this time.  Continue anticoagulation as ordered by Cardiology Service    Hortencia Pilar, MD  08/07/2020 7:52 PM

## 2020-08-07 NOTE — H&P (View-Only) (Signed)
MRN : [id number]  Ralph Peterson is a 55 y.o. (1966/01/21) male who presents with chief complaint of No chief complaint on file. Marland Kitchen  History of Present Illness:   The patient returns to the office for followup and review of the noninvasive studies. There has been a significant deterioration in the lower extremity symptoms.  The patient notes interval shortening of their claudication distance and development of mild rest pain symptoms. Previous ulcer has not improved.  There have been no significant changes to the patient's overall health care.  The patient denies amaurosis fugax or recent TIA symptoms. There are no recent neurological changes noted. The patient denies history of DVT, PE or superficial thrombophlebitis. The patient denies recent episodes of angina or shortness of breath.   ABI's Rt=[place] and Lt=[place] (previous ABI's Rt=[place] and Lt=[place]) Duplex US of the bilateral lower extremity arterial system shows patent SFA and popliteal arteries bilaterally with monophasic flow throughout  No outpatient medications have been marked as taking for the 08/10/20 encounter (Appointment) with Delana Meyer, Dolores Lory, MD.    Past Medical History:  Diagnosis Date  . Anxiety   . Arrhythmia    atrial fibrillation  . CAD (coronary artery disease)    a. 09/2010 Cath/PCI ([facility]): LM nl, LAD 60m D1 80 (small), LCX 39mOM1 30, RI 70 (small), RCA 70 (DES).  . CHF (congestive heart failure) ([facility]  . Coronary artery disease   . COVID-19 virus infection 07/2019  . Diabetes mellitus without complication ([facility]  . ESRD (end stage renal disease) ([facility]  . Hyperlipidemia   . Hypertension   . Ischemic cardiomyopathy    a.  12/2011 Echo ([facility]): NL EF, mod LVH. Mild AS/MS, triv PR/TR. 07/2019 Echo: EF 25-30%, GR1 DD, inf/post HK, low nl RV fxn, mildly dil RA, triv MR, mild Ao sclerosis w/o stenosis.  . NSTEMI (non-ST elevated myocardial infarction) ([facility]  . PAD (peripheral artery disease) ([facility]    a. 03/2016 s/p PTA/DEB R SFA/popliteal/peroneal; b. 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 08/2017 Atherectomy/DEB dist L SFA/popliteal. PTA of L AT; d. 06/2018 PTA/DEB L SFA/popliteal/PT/AT; e. 01/2019 Stable ABIs.  . Sleep apnea     Past Surgical History:  Procedure Laterality Date  . ABDOMINAL AORTOGRAM N/A 09/10/2017   Procedure: ABDOMINAL AORTOGRAM;  Surgeon: ArWellington HampshireMD;  [facility];  Service: Cardiovascular;  Laterality: N/A;  . AMPUTATION TOE Bilateral    one toe on right, all five toes on the left  . [facility]  [facility] X1 stent 2.5 x 33 mm xience stent Distal TCA  . LOWER EXTREMITY ANGIOGRAPHY Bilateral 09/10/2017   Procedure: Lower Extremity Angiography;  Surgeon: ArWellington HampshireMD;  [place] [facility];  Service: Cardiovascular;  Laterality: Bilateral;  . LOWER EXTREMITY ANGIOGRAPHY Left 06/23/2018   Procedure: LOWER EXTREMITY ANGIOGRAPHY;  Surgeon: ScKatha CabalMD;  [place] [facility];  Service: Cardiovascular;  Laterality: Left;  . LOWER EXTREMITY ANGIOGRAPHY Right 02/01/2020   Procedure: LOWER EXTREMITY ANGIOGRAPHY;  Surgeon: ScKatha CabalMD;  [place];  Service: Cardiovascular;  Laterality: Right;  . PERIPHERAL VASCULAR ATHERECTOMY Left 09/10/2017   Procedure: PERIPHERAL VASCULAR ATHERECTOMY;  Surgeon: ArWellington HampshireMD;  [facility];  Service: Cardiovascular;  Laterality: Left;  SFA/POPLITEAL  . PERIPHERAL VASCULAR BALLOON ANGIOPLASTY Left 09/10/2017   Procedure: PERIPHERAL VASCULAR BALLOON ANGIOPLASTY;  Surgeon: ArFletcher Anon  Mertie Clause, MD;  Location: [place] [facility] LAB;  Service: Cardiovascular;  Laterality: Left;  ANTERIAL TIBIAL  . PERIPHERAL VASCULAR CATHETERIZATION Right 03/26/2016   Procedure: Lower Extremity Angiography;  Surgeon: Katha Cabal, MD;  Location: [place] CV LAB;  Service: Cardiovascular;  Laterality: Right;  . PERIPHERAL  VASCULAR INTERVENTION Left 09/10/2017   Procedure: PERIPHERAL VASCULAR INTERVENTION;  Surgeon: Wellington Hampshire, MD;  Location: [place] [facility] LAB;  Service: Cardiovascular;  Laterality: Left;  SFA/POPLITEAL    Social History Social History   Tobacco Use  . Smoking status: Former Smoker    Types: Cigarettes    Quit date: 2010    Years since quitting: 12.0  . Smokeless tobacco: Never Used  Vaping Use  . Vaping Use: Never used  Substance Use Topics  . Alcohol use: Not Currently  . Drug use: No    Family History Family History  Problem Relation Age of Onset  . Hypertension Mother     Allergies  Allergen Reactions  . Oxycodone-Acetaminophen Itching  . Gabapentin Other (See Comments)  . Tramadol Other (See Comments)  . Penicillins Rash    Has patient had a PCN reaction causing immediate rash, facial/tongue/throat swelling, SOB or lightheadedness with hypotension: Yes Has patient had a PCN reaction causing severe rash involving mucus membranes or skin necrosis: Yes Has patient had a PCN reaction that required hospitalization: No Has patient had a PCN reaction occurring within the last 10 years: No If all of the above answers are "NO", then may proceed with Cephalosporin use.      REVIEW OF SYSTEMS (Negative unless checked)  Constitutional: '[]'$ Weight loss  '[]'$ Fever  '[]'$ Chills Cardiac: '[]'$ Chest pain   '[]'$ Chest pressure   '[]'$ Palpitations   '[]'$ Shortness of breath when laying flat   '[]'$ Shortness of breath with exertion. Vascular:  '[x]'$ Pain in legs with walking   '[x]'$ Pain in legs at rest  '[]'$ History of DVT   '[]'$ Phlebitis   '[]'$ Swelling in legs   '[]'$ Varicose veins   '[x]'$ Non-healing ulcers Pulmonary:   '[]'$ Uses home oxygen   '[]'$ Productive cough   '[]'$ Hemoptysis   '[]'$ Wheeze  '[x]'$ COPD   '[]'$ Asthma Neurologic:  '[]'$ Dizziness   '[]'$ Seizures   '[]'$ History of stroke   '[]'$ History of TIA  '[]'$ Aphasia   '[]'$ Vissual changes   '[]'$ Weakness or numbness in arm   '[]'$ Weakness or numbness in leg Musculoskeletal:   '[]'$ Joint swelling    '[]'$ Joint pain   '[]'$ Low back pain Hematologic:  '[]'$ Easy bruising  '[]'$ Easy bleeding   '[]'$ Hypercoagulable state   '[]'$ Anemic Gastrointestinal:  '[]'$ Diarrhea   '[]'$ Vomiting  '[]'$ Gastroesophageal reflux/heartburn   '[]'$ Difficulty swallowing. Genitourinary:  '[x]'$ Chronic kidney disease   '[]'$ Difficult urination  '[]'$ Frequent urination   '[]'$ Blood in urine Skin:  '[]'$ Rashes   '[]'$ Ulcers  Psychological:  '[]'$ History of anxiety   '[]'$  History of major depression.  Physical Examination  There were no vitals filed for this visit. There is no height or weight on file to calculate BMI. Gen: WD/WN, NAD Head: [place]/AT, No temporalis wasting.  Ear/Nose/Throat: Hearing grossly intact, nares w/o erythema or drainage Eyes: PER, EOMI, sclera nonicteric.  Neck: Supple, no large masses.   Pulmonary:  Good air movement, no audible wheezing bilaterally, no use of accessory muscles.  Cardiac: RRR, no JVD Vascular: right av fistula good thrill good bruit, right foot ulcer noninfected Vessel Right Left  Radial Palpable Palpable  PT Not Palpable Not Palpable  DP Not Palpable Not Palpable  Gastrointestinal: Non-distended. No guarding/no peritoneal signs.  Musculoskeletal: M/S 5/5 throughout.  No deformity or atrophy.  Neurologic: CN 2-12 intact. Symmetrical.  Speech is fluent. Motor exam as listed above. Psychiatric: Judgment intact, Mood & affect appropriate for pt's clinical situation. Dermatologic: No rashes + ulcers noted.  No changes consistent with cellulitis.   CBC Lab Results  Component Value Date   WBC 13.5 (H) 03/10/2020   HGB 10.8 (L) 03/10/2020   HCT 34.2 (L) 03/10/2020   MCV 91.4 03/10/2020   PLT 167 03/10/2020    BMET    Component Value Date/Time   NA 129 (L) 03/10/2020 0440   NA 139 03/19/2016 1423   K 4.2 03/10/2020 0440   CL 88 (L) 03/10/2020 0440   CO2 28 03/10/2020 0440   GLUCOSE 114 (H) 03/10/2020 0440   BUN 62 (H) 03/10/2020 0440   BUN 33 (H) 03/19/2016 1423   CREATININE 6.98 (H) 03/10/2020 0440   CALCIUM  8.0 (L) 03/10/2020 0440   GFRNONAA 8 (L) 03/10/2020 0440   GFRAA 10 (L) 03/10/2020 0440   CrCl cannot be calculated (Patient's most recent lab result is older than the maximum 21 days allowed.).  COAG Lab Results  Component Value Date   INR 1.5 (H) 03/07/2020   INR 1.2 07/28/2019   INR 1.16 09/05/2017    Radiology DG Sniff Test  Result Date: 07/25/2020 CLINICAL DATA:  Shortness of breath EXAM: CHEST FLUOROSCOPY TECHNIQUE: Real-time fluoroscopic evaluation of the chest was performed. FLUOROSCOPY TIME:  Fluoroscopy Time:  42 seconds Radiation Exposure Index (if provided by the fluoroscopic device): 5.4 mGy Number of Acquired Spot Images: Cine fluoroscopic runs COMPARISON:  03/07/2020 FINDINGS: Mild elevation of the right hemidiaphragm is noted stable from the prior examination. Deep inspiration shows normal motion of the left hemidiaphragm with very minimal motion of the right hemidiaphragm. Sniff test shows minimal motion on the right with more normal appearing excursion of the diaphragm on the left. IMPRESSION: Significant decreased function of the right hemidiaphragm. No paradoxical motion is noted. Electronically Signed   By: Inez Catalina M.D.   On: 07/25/2020 12:06     Assessment/Plan 1. Atherosclerosis of native arteries of the extremities with ulceration ([place])  Recommend:  The patient has evidence of severe atherosclerotic changes of both lower extremities associated with ulceration and tissue loss of the right foot.  This represents a limb threatening ischemia and places the patient at the risk for limb loss.  Patient should undergo angiography of the right lower extremities with the hope for intervention for limb salvage.  The risks and benefits as well as the alternative therapies was discussed in detail with the patient.  All questions were answered.  Patient agrees to proceed with right leg angiography.  The patient will follow up with me in the office after the procedure.     2. Complication of vascular access for dialysis, sequela Recommend:  The patient is doing well and currently has adequate dialysis access. The patient's dialysis center is not reporting any access issues. Flow pattern is stable when compared to the prior ultrasound.  The patient should have a duplex ultrasound of the dialysis access in 6 months.  The patient will follow-up with me in the office after each ultrasound     3. ESRD (end stage renal disease) ([place]) At the present time the patient has adequate dialysis access.  Continue hemodialysis as ordered without interruption.  Avoid nephrotoxic medications and dehydration.  Further plans per nephrology  4. Coronary artery disease of native artery of native heart with stable angina pectoris (HCC) Continue cardiac and antihypertensive medications  as already ordered and reviewed, no changes at this time.  Continue statin as ordered and reviewed, no changes at this time  Nitrates PRN for chest pain   5. Primary hypertension Continue antihypertensive medications as already ordered, these medications have been reviewed and there are no changes at this time.   6. Atrial fibrillation with RVR (HCC) Continue antiarrhythmia medications as already ordered, these medications have been reviewed and there are no changes at this time.  Continue anticoagulation as ordered by Cardiology Service    Hortencia Pilar, MD  08/07/2020 7:52 PM

## 2020-08-08 ENCOUNTER — Other Ambulatory Visit (INDEPENDENT_AMBULATORY_CARE_PROVIDER_SITE_OTHER): Payer: Self-pay | Admitting: Vascular Surgery

## 2020-08-08 ENCOUNTER — Encounter: Payer: Medicare PPO | Admitting: Podiatry

## 2020-08-08 DIAGNOSIS — N186 End stage renal disease: Secondary | ICD-10-CM

## 2020-08-08 DIAGNOSIS — T829XXS Unspecified complication of cardiac and vascular prosthetic device, implant and graft, sequela: Secondary | ICD-10-CM

## 2020-08-08 DIAGNOSIS — I7025 Atherosclerosis of native arteries of other extremities with ulceration: Secondary | ICD-10-CM

## 2020-08-10 ENCOUNTER — Encounter: Payer: Medicare PPO | Attending: Physician Assistant | Admitting: Physician Assistant

## 2020-08-10 ENCOUNTER — Ambulatory Visit (INDEPENDENT_AMBULATORY_CARE_PROVIDER_SITE_OTHER): Payer: Medicare PPO

## 2020-08-10 ENCOUNTER — Encounter (INDEPENDENT_AMBULATORY_CARE_PROVIDER_SITE_OTHER): Payer: Self-pay | Admitting: Vascular Surgery

## 2020-08-10 ENCOUNTER — Telehealth (INDEPENDENT_AMBULATORY_CARE_PROVIDER_SITE_OTHER): Payer: Self-pay

## 2020-08-10 ENCOUNTER — Other Ambulatory Visit: Payer: Self-pay

## 2020-08-10 ENCOUNTER — Ambulatory Visit (INDEPENDENT_AMBULATORY_CARE_PROVIDER_SITE_OTHER): Payer: Medicare PPO | Admitting: Vascular Surgery

## 2020-08-10 ENCOUNTER — Encounter (INDEPENDENT_AMBULATORY_CARE_PROVIDER_SITE_OTHER): Payer: Self-pay

## 2020-08-10 ENCOUNTER — Other Ambulatory Visit
Admission: RE | Admit: 2020-08-10 | Discharge: 2020-08-10 | Disposition: A | Discharge status: Home / Self Care | Payer: Medicare PPO | Source: Ambulatory Visit | Attending: Physician Assistant | Admitting: Physician Assistant

## 2020-08-10 VITALS — BP 128/84 | HR 78 | Ht 72.0 in | Wt 215.0 lb

## 2020-08-10 DIAGNOSIS — L89899 Pressure ulcer of other site, unspecified stage: Secondary | ICD-10-CM | POA: Diagnosis present

## 2020-08-10 DIAGNOSIS — L97812 Non-pressure chronic ulcer of other part of right lower leg with fat layer exposed: Secondary | ICD-10-CM | POA: Insufficient documentation

## 2020-08-10 DIAGNOSIS — Z20822 Contact with and (suspected) exposure to covid-19: Secondary | ICD-10-CM | POA: Diagnosis not present

## 2020-08-10 DIAGNOSIS — T829XXS Unspecified complication of cardiac and vascular prosthetic device, implant and graft, sequela: Secondary | ICD-10-CM | POA: Diagnosis not present

## 2020-08-10 DIAGNOSIS — I4891 Unspecified atrial fibrillation: Secondary | ICD-10-CM

## 2020-08-10 DIAGNOSIS — Z01812 Encounter for preprocedural laboratory examination: Secondary | ICD-10-CM | POA: Diagnosis present

## 2020-08-10 DIAGNOSIS — N186 End stage renal disease: Secondary | ICD-10-CM

## 2020-08-10 DIAGNOSIS — Z880 Allergy status to penicillin: Secondary | ICD-10-CM | POA: Insufficient documentation

## 2020-08-10 DIAGNOSIS — I25118 Atherosclerotic heart disease of native coronary artery with other forms of angina pectoris: Secondary | ICD-10-CM | POA: Diagnosis not present

## 2020-08-10 DIAGNOSIS — T8789 Other complications of amputation stump: Secondary | ICD-10-CM | POA: Diagnosis not present

## 2020-08-10 DIAGNOSIS — B952 Enterococcus as the cause of diseases classified elsewhere: Secondary | ICD-10-CM | POA: Insufficient documentation

## 2020-08-10 DIAGNOSIS — E1122 Type 2 diabetes mellitus with diabetic chronic kidney disease: Secondary | ICD-10-CM | POA: Diagnosis not present

## 2020-08-10 DIAGNOSIS — B965 Pseudomonas (aeruginosa) (mallei) (pseudomallei) as the cause of diseases classified elsewhere: Secondary | ICD-10-CM | POA: Diagnosis not present

## 2020-08-10 DIAGNOSIS — I5042 Chronic combined systolic (congestive) and diastolic (congestive) heart failure: Secondary | ICD-10-CM | POA: Diagnosis not present

## 2020-08-10 DIAGNOSIS — L89893 Pressure ulcer of other site, stage 3: Secondary | ICD-10-CM | POA: Diagnosis not present

## 2020-08-10 DIAGNOSIS — E11621 Type 2 diabetes mellitus with foot ulcer: Secondary | ICD-10-CM | POA: Insufficient documentation

## 2020-08-10 DIAGNOSIS — Z992 Dependence on renal dialysis: Secondary | ICD-10-CM | POA: Diagnosis not present

## 2020-08-10 DIAGNOSIS — I7025 Atherosclerosis of native arteries of other extremities with ulceration: Secondary | ICD-10-CM | POA: Diagnosis not present

## 2020-08-10 DIAGNOSIS — Z89431 Acquired absence of right foot: Secondary | ICD-10-CM | POA: Insufficient documentation

## 2020-08-10 DIAGNOSIS — L97512 Non-pressure chronic ulcer of other part of right foot with fat layer exposed: Secondary | ICD-10-CM | POA: Insufficient documentation

## 2020-08-10 DIAGNOSIS — I130 Hypertensive heart and chronic kidney disease with heart failure and stage 1 through stage 4 chronic kidney disease, or unspecified chronic kidney disease: Secondary | ICD-10-CM | POA: Diagnosis not present

## 2020-08-10 DIAGNOSIS — I10 Essential (primary) hypertension: Secondary | ICD-10-CM

## 2020-08-10 DIAGNOSIS — I70213 Atherosclerosis of native arteries of extremities with intermittent claudication, bilateral legs: Secondary | ICD-10-CM

## 2020-08-10 NOTE — Telephone Encounter (Signed)
Called pt and made him aware [facility] tomorrow between 8 AM and 1 PM

## 2020-08-10 NOTE — Telephone Encounter (Signed)
Called pt and made him aware of pre procedure instructions

## 2020-08-10 NOTE — Progress Notes (Addendum)
Ralph Peterson, Ralph Peterson ([id number]) Visit Report for 08/10/2020 Chief Complaint Document Details Patient Name: Ralph Peterson, Ralph Peterson. Date of Service: 08/10/2020 2:30 PM Medical Record Number: [id number] Patient Account Number: [id number] Date of Birth/Sex: 1966-06-24 (55 y.o. 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 M) Treating RN: Dolan Amen Primary Care Provider: Vidal Schwalbe Other Clinician: Referring Provider: Vidal Schwalbe Treating Provider/Extender: Skipper Cliche in Treatment: 15 Debridement Performed for Wound #1 Right,Lateral Foot Assessment: Performed By: Physician Tommie Sams., PA-C Debridement Type: Debridement Level of Consciousness (Pre- Awake and Alert procedure): Pre-procedure Verification/Time Out Yes - 15:38 Taken: Start Time: 15:38 Total Area Debrided (L x W): 1.3 (cm) x 0.4 (cm) = 0.52 (cm) Tissue and other material Viable, Non-Viable, Subcutaneous, Tendon debrided: Level: Skin/Subcutaneous Tissue/Muscle Debridement Description: Excisional Instrument: Curette, Forceps, Scissors Bleeding: Minimum Hemostasis Achieved: Pressure End Time: 15:42 Response to Treatment: Procedure was tolerated well Level of Consciousness (Post- Awake and  Alert procedure): Post Debridement Measurements of Total Wound Length: (cm) 1.3 Stage: Category/Stage IV Width: (cm) 0.4 Depth: (cm) 0.5 Volume: (cm) 0.204 Character of Wound/Ulcer Post Debridement: Stable Post Procedure Diagnosis Same as Pre-procedure Electronic Signature(s) Signed: 08/10/2020 4:30:59 PM By: Charlett Nose RN Signed: 08/10/2020 6:06:24 PM By: Worthy Keeler PA-C Entered By: Georges Mouse, Minus Breeding on 08/10/2020 15:42:52 Ralph Peterson ([id number]) -------------------------------------------------------------------------------- Debridement Details Patient Name: Ralph Peterson. Date of Service: 08/10/2020 2:30 PM Medical Record Number: [id number] Patient Account Number: [id number] Date of Birth/Sex: 06-Jun-1966 (55 y.o. M) Treating RN: Dolan Amen Primary Care Provider: Vidal Schwalbe Other Clinician: Referring Provider: Vidal Schwalbe Treating Provider/Extender: Skipper Cliche in Treatment: 15 Debridement Performed for Wound #2 Right,Dorsal Foot Assessment: Performed By: Physician Tommie Sams., PA-C Debridement Type: Debridement Level of Consciousness (Pre- Awake and Alert procedure): Pre-procedure Verification/Time Out Yes - 15:43 Taken: Start Time: 15:43 Total Area Debrided (L x W): 0.4 (cm) x 0.1 (cm) = 0.04 (cm) Tissue and other material Viable, Non-Viable, Slough, Subcutaneous, Slough debrided: Level: Skin/Subcutaneous Tissue Debridement Description: Excisional Instrument: Curette Bleeding: Minimum Hemostasis Achieved: Pressure End Time: 15:45 Response to Treatment: Procedure was tolerated well Level of Consciousness (Post- Awake and Alert procedure): Post Debridement Measurements of Total Wound Length: (cm) 0.4 Width: (cm) 0.1 Depth: (cm) 0.2 Volume: (cm) 0.006 Character of Wound/Ulcer Post Debridement: Stable Post Procedure Diagnosis Same as Pre-procedure Electronic Signature(s) Signed: 08/10/2020 4:30:59  PM By: Charlett Nose RN Signed: 08/10/2020 6:06:24 PM By: Worthy Keeler PA-C Entered By: Georges Mouse, Minus Breeding on 08/10/2020 15:45:10 Ralph Peterson ([id number]) -------------------------------------------------------------------------------- Debridement Details Patient Name: Ralph Peterson. Date of Service: 08/10/2020 2:30 PM Medical Record Number: [id number] Patient Account Number: [id number] Date of Birth/Sex: [birth year] (55 y.o. M) Treating RN: Dolan Amen Primary Care Provider: Vidal Schwalbe Other Clinician: Referring Provider: Vidal Schwalbe Treating Provider/Extender: Skipper Cliche in Treatment: 15 Debridement Performed for Wound #5 Right,Plantar Foot Assessment: Performed By: Physician Tommie Sams., PA-C Debridement Type: Debridement Severity of Tissue Pre Debridement: Fat layer exposed Level of Consciousness (  Pre- Awake and Alert procedure): Pre-procedure Verification/Time Out Yes - 15:46 Taken: Start Time: 15:46 Total Area Debrided (L x W): 0.7 (cm) x 0.3 (cm) = 0.21 (cm) Tissue and other material Viable, Non-Viable, Slough, Subcutaneous, Skin: Epidermis, Slough debrided: Level: Skin/Subcutaneous Tissue Debridement Description: Excisional Instrument: Curette Bleeding: Minimum Hemostasis Achieved: Pressure End Time: 15:50 Response to Treatment: Procedure was tolerated well Level of Consciousness (Post- Awake and Alert procedure): Post Debridement Measurements of Total Wound Length: (cm) 0.7 Width: (cm) 0.3 Depth: (cm) 0.9 Volume: (cm) 0.148 Character of Wound/Ulcer Post Debridement: Stable Severity of Tissue Post Debridement: Fat layer exposed Post Procedure Diagnosis Same as Pre-procedure Electronic Signature(s) Signed: 08/10/2020 4:30:59 PM By: Charlett Nose RN Signed: 08/10/2020 6:06:24 PM By: Worthy Keeler PA-C Entered By: Georges Mouse, Minus Breeding on 08/10/2020 15:50:14 Ralph Peterson  ([id number]) -------------------------------------------------------------------------------- HPI Details Patient Name: Ralph Peterson. Date of Service: 08/10/2020 2:30 PM Medical Record Number: [id number] Patient Account Number: [id number] Date of Birth/Sex: Jan 18, 1966 (55 y.o. M) Treating RN: Cornell Barman Primary Care Provider: Vidal Schwalbe Other Clinician: Referring Provider: Vidal Schwalbe Treating Provider/Extender: Skipper Cliche in Treatment: 15 History of Present Illness HPI Description: He has been having with multiple ulcers of his feet. He does have a couple callus areas that have ulcerations underneath on the left foot. 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 Fortunately there is no sign of active infection at this time which is good news he has been using Telfa pads after applying Betadine this is not really absorbing enough of the drainage however which is causing the area especially at the amputation site to be very macerated. The patient states that he actually has a work-up with Dr. Delana Meyer at [facility] next Thursday. For that reason we did not perform ABIs today and again I really am not doing any significant debridement today we will discontinue mild things to remove some of the necrotic debris so that we hopefully get these wounds moving in the proper direction. With that being said his extremities do appear to be warm without any signs obviously of significant arterial flow. The patient does have a history of diabetes mellitus type 2, congestive heart failure, end- stage renal disease, and dependence on renal dialysis. He notes that he did have an exacerbation of heart failure recently which caused his legs to swell he had a couple wounds on his left leg but fortunately these have actually improved and are healed as of  today. 05/09/2020 on evaluation today patient appears to be doing decently well all things considered in regard to his wounds. He does have the sutures out at this point in regard to his right amputation site. That does seem to be showing signs of improvement which is good news from the standpoint of at least being able to get some of the slough out although he does have quite a bit of slough buildup on the surface of the wound here. I think that is can require debridement before were able to see dramatic filling in. He may even require wound VAC at the site. With regard to the lateral foot this seems to be loosening up with the Santyl and I am pleased in that regard. In regard to the left foot everything appears to be healed and is doing great. 05/18/2020 on evaluation today patient appears to be  doing well at this time in regard to his wounds. 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 There is no signs of active infection at this time. 05/26/2020 on evaluation today patient appears to be actually showing signs of improvement in general in regard to his wounds. The plantar aspect wound does connect to the dorsal surface wound which I initially expected but after last week was hopeful that that would not be the case. I was in fact hoping it would completely sealed up this week but it does not seem to have happened. There is no signs of active infection at this time which is great news. With that being said he does still have tender noted on the lateral aspect of the foot where the wound is there but this does not seem to be nearly as slough covered in fact I was able to carefully clean this off today and other than the fact that we need to help seal up the tendon I do not  see any signs of infection at this point. 06/02/2020 upon evaluation today patient actually is making excellent improvement in regard to all of his wounds. In fact the plantar aspect of his foot looks like it may have sealed up although I cannot be [id number] certain in this regard. With that being said I do see where there was some drainage on his dressing but today I cannot find any real depth to the wound opening and it appears to be close to closed if not completely. Nonetheless I do think that the dorsal surface of the foot is doing excellent in fact we have gotten approval for snap VAC but I am not even sure that skin to be necessary he is healing so well already. In regard to the lateral foot I do feel like the collagen has been beneficial here and my recommendation is good to be that we likely continue as such with this. Tenderness still exposed but seems to be nice and moist I see no issues in that regard. 06/09/2020 on evaluation today patient appears to be doing pretty well in regard to his foot ulcers although the plantar ulcer does appear to be deeper than what I was able to measure last week that is unfortunate. The lateral foot seems to be doing okay. Fortunately there is no signs of active infection at this time. How are in regard to the blister on the lower leg on the right that opened last time this appears to be erythematous around and I feel like it is also warm to touch I am concerned about infection here. 06/13/2020 on evaluation today patient appears to be doing excellent in regard to his lateral foot wound is not measuring a lot smaller but it does look like it is fairly healthy at this time. His culture did show no signs of growth at this point which is good news. With that being said he has been taking the antibiotic and does feel like that has helped his leg is feeling better. This is good news. There are signs of new epithelial growth as well. With regard to his plantar foot this  still shows signs of some depth its really slowly closing up and again I am not to concerned but and I cannot find where  it directly connects to the area above but there may be some small connections. Either way I think that potentially the goal would be to try the snap VAC today to see if this could be beneficial I think it may help to granulate in the upper wound and if we do that may be the plantar wound will show signs of improvement a little bit more sufficiently. 06/20/2020 upon evaluation today patient appears to be doing decently well in regard to his leg wound this is good news. Unfortunately the lateral foot wound seems to be somewhat dry at this point I think we continue to go back to the Hoffman that seem to be doing better for him. With that being said with regard to the snap VAC on the dorsal surface of his foot it apparently did not seal well and continued to leak unfortunately. He had a lot of issues and never was able to get it to function properly since we put this on last week. Nonetheless I did advise that we could give this another try putting on a little bit differently that may allow this to be able to function appropriately. 06/27/2020 upon evaluation today patient appears to be doing very well in regard to his left foot dorsal wound. This is actually showing signs of excellent improvement and overall seems to be managing quite nicely in my opinion. With that being said I do feel like that the plantar aspect of his foot is a little bit more significant at this time he still has some depth and we really have not gotten this to feeling very well at all. The lateral portion of the foot the Santyl does seem to be helping clear away and the leg is going require some debridement to remove some of the dead tissue and skin from around the edges of the wound but overall seems to be doing much better I think that is very close to complete closure. 07/07/2020 upon evaluation today patient  appears to be doing well with regard to his wounds in general. The plantar foot ulcer is actually the most concerning that of anything worsening at this time. I do believe that he could potentially benefit from a continuation of packing in the plantar foot in order to keep this open and draining I do not want it to seal up and cause any trouble. With regard to the leg this is healed with regard to TINO, VERHALEN. ([id number]) the lateral portion of his foot I think that is actually doing better there were still making some slow progress here the top of his foot looks excellent. 07/13/2020 upon evaluation today patient appears to be doing well currently with regard to his wounds in general. The best news is that the plantar wound actually seems to be showing signs of good epithelization and granulation there does not appear to be any depth like it was even last week and very pleased in this regard. The dorsal foot wound is measuring significantly smaller doing great and subsequently the patient also states that the lateral foot seems to be doing quite well. All in all I am very pleased with how things are progressing I think that he is making great progress towards completion of treatment. 08/10/2020 upon evaluation today patient in general appears to be doing well with regard to his wounds. The lateral foot seems to be doing better the dorsal foot also seems to be doing well. Unfortunately the plantar foot is not doing nearly as well. I  am much more concerned about this. It appears that he may have had an abscess in this area which was what I was concerned about all along but again it appeared to have sealed up but obviously that was not the case. Electronic Signature(s) Signed: 08/10/2020 5:48:55 PM By: Worthy Keeler PA-C Previous Signature: 08/10/2020 3:29:30 PM Version By: Worthy Keeler PA-C Previous Signature: 08/10/2020 3:26:10 PM Version By: Worthy Keeler PA-C Entered By: Worthy Keeler on 08/10/2020 17:48:54 Ralph Peterson ([id number]) -------------------------------------------------------------------------------- Physical Exam Details Patient Name: Ralph Peterson Date of Service: 08/10/2020 2:30 PM Medical Record Number: [id number] Patient Account Number: [id number] Date of Birth/Sex: May 09, 1966 (55 y.o. M) Treating RN: Cornell Barman Primary Care Provider: Vidal Schwalbe Other Clinician: Referring Provider: Vidal Schwalbe Treating Provider/Extender: Skipper Cliche in Treatment: 66 Constitutional Well-nourished and well-hydrated in no acute distress. Respiratory normal breathing without difficulty. Psychiatric this patient is able to make decisions and demonstrates good insight into disease process. Alert and Oriented x 3. pleasant and cooperative. Notes Upon inspection patient's wound on the lateral foot did have a little bit of tendon and had to be removed that was necrotic he tolerated that without complication post debridement this appears to be doing much better. In regard to the dorsal foot this also seems to be doing quite well and I am pleased noted significant debridement was necessary though I did remove some necrotic debris on the surface of wound down to good subcutaneous tissue. On the plantar foot I did have to perform debridement here as well and actually open what seemed to be a abscess pocket. Because there was purulent drainage I cleaned this away as best as possible and then did a deep wound culture. This cavity unfortunately is quite deep extending from the plantar surface of the foot up towards the at least subcutaneous layer of the dorsal surface of the foot in this area. This has me concerned that we may need to evaluate things further I am recommending an MRI Electronic Signature(s) Signed: 08/10/2020 5:49:53 PM By: Worthy Keeler PA-C Previous Signature: 08/10/2020 3:30:24 PM Version By: Worthy Keeler PA-C Previous  Signature: 08/10/2020 3:26:26 PM Version By: Worthy Keeler PA-C Entered By: Worthy Keeler on 08/10/2020 17:49:52 Ralph Peterson ([id number]) -------------------------------------------------------------------------------- Physician Orders Details Patient Name: Ralph Peterson Date of Service: 08/10/2020 2:30 PM Medical Record Number: [id number] Patient Account Number: [id number] Date of Birth/Sex: 16-Apr-1966 (55 y.o. M) Treating RN: Dolan Amen Primary Care Provider: Vidal Schwalbe Other Clinician: Referring Provider: Vidal Schwalbe Treating Provider/Extender: Skipper Cliche in Treatment: 15 Verbal / Phone Orders: No Diagnosis Coding ICD-10 Coding Code Description [phone] Pressure ulcer of other site, stage 3 T81.31XA Disruption of external operation (surgical) wound, not elsewhere classified, initial encounter 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d L97.812 Non-pressure chronic ulcer of other part of right lower leg with fat layer exposed E11.621 Type 2 diabetes mellitus with foot ulcer I50.42 Chronic combined systolic (congestive) and diastolic (congestive) heart failure 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 Bathing/ Shower/ Hygiene o Clean wound with Normal Saline or wound cleanser. Off-Loading Wound #1 Right,Lateral Foot o Open toe surgical shoe with peg assist. Wound #2 Right,Dorsal Foot o Open toe surgical shoe with peg assist. Wound #5 Right,Plantar Foot o Open toe surgical shoe with peg assist. Wound Treatment Wound #1 - Foot Wound Laterality: Right, Lateral  Cleanser: Normal Saline (DME) (Generic) 3 x Per Week/30 Days Discharge Instructions: Wash your hands with soap and water. Remove old dressing, discard into plastic bag and place into trash. Cleanse the wound with Normal Saline prior to applying a clean  dressing using gauze sponges, not tissues or cotton balls. Do not scrub or use excessive force. Pat dry using gauze sponges, not tissue or cotton balls. Primary Dressing: Prisma 4.34 (in) (Generic) 3 x Per Week/30 Days Discharge Instructions: Moisten w/normal saline or sterile water; Cover wound as directed. Do not remove from wound bed. Secondary Dressing: ABD Pad 5x9 (in/in) (Generic) 3 x Per Week/30 Days Discharge Instructions: Cover with ABD pad Secured With: Conforming Stretch Gauze Bandage 4x75 (in/in) (Generic) 3 x Per Week/30 Days Discharge Instructions: Apply as directed Wound #2 - Foot Wound Laterality: Dorsal, Right Primary Dressing: Prisma 4.34 (in) (Generic) 3 x Per Week/30 Days Discharge Instructions: Moisten w/normal saline or sterile water; Cover wound as directed. Do not remove from wound bed. Secondary Dressing: ABD Pad 5x9 (in/in) (Generic) 3 x Per Week/30 Days Discharge Instructions: Cover with ABD pad Secured With: Conforming Stretch Gauze Bandage 4x75 (in/in) (Generic) 3 x Per Week/30 Days Discharge Instructions: Apply as directed Ralph Peterson, Ralph Peterson ([id number]) Wound #5 - Foot Wound Laterality: Plantar, Right Cleanser: Normal Saline (Generic) 3 x Per Week/30 Days Discharge Instructions: Wash your hands with soap and water. Remove old dressing, discard into plastic bag and place into trash. Cleanse the wound with Normal Saline prior to applying a clean dressing using gauze sponges, not tissues or cotton balls. Do not scrub or use excessive force. Pat dry using gauze sponges, not tissue or cotton balls. Primary Dressing: Silvercel Small 2x2 (in/in) (Generic) 3 x Per Week/30 Days Discharge Instructions: Apply Silvercel Small 2x2 (in/in) as instructed Secondary Dressing: ABD Pad 5x9 (in/in) (Generic) 3 x Per Week/30 Days Discharge Instructions: Cover with ABD pad Secured With: Conforming Stretch Gauze Bandage 4x75 (in/in) (Generic) 3 x Per Week/30 Days Discharge  Instructions: Apply as directed Consults o Infectious Disease - Patient requires a referral to ID due to deep culture findings with medication interactions precluding oral therapy. Laboratory o Bacteria identified in Wound by Culture (MICRO) oooo LOINC Code: [id number] oooo Convenience Name: Wound culture routine Radiology o Magnetic Resonance Imaging (MRI) - with and without contrast, Right foot Patient Medications Allergies: PCN Notifications Medication Indication Start End Bactrim DS 08/10/2020 DOSE 1 - oral 800 mg-160 mg tablet - 1 tablet oral taken 2 times per day for 14 days Electronic Signature(s) Signed: 08/14/2020 5:09:34 PM By: Worthy Keeler PA-C Previous Signature: 08/10/2020 5:53:57 PM Version By: Worthy Keeler PA-C Previous Signature: 08/10/2020 4:29:16 PM Version By: Georges Mouse, Minus Breeding RN Entered By: Worthy Keeler on 08/14/2020 17:00:09 Ralph Peterson ([id number]) -------------------------------------------------------------------------------- Problem List Details Patient Name: Ralph Peterson. Date of Service: 08/10/2020 2:30 PM Medical Record Number: [id number] Patient Account Number: [id number] Date of Birth/Sex: 06-04-1966 (55 y.o. M) Treating RN: Cornell Barman Primary Care Provider: Vidal Schwalbe Other Clinician: Referring Provider: Vidal Schwalbe Treating Provider/Extender: Skipper Cliche in Treatment: 15 Active Problems ICD-10 Encounter Code Description Active Date MDM Diagnosis L89.893 Pressure ulcer of other site, stage 3 04/27/2020 No Yes T81.31XA Disruption of external operation (surgical) wound, not elsewhere 04/27/2020 No Yes classified, initial encounter L97.512 Non-pressure chronic ulcer of other part of right foot with fat layer 04/27/2020 No Yes exposed L97.812 Non-pressure chronic ulcer of other part of right lower leg with fat layer 04/27/2020 No Yes exposed  [id number] Type 2 diabetes mellitus with foot ulcer 04/27/2020 No  Yes I50.42 Chronic combined systolic (congestive) and diastolic (congestive) heart 04/27/2020 No Yes failure N18.6 End stage renal disease 04/27/2020 No Yes Z99.2 Dependence on renal dialysis 04/27/2020 No Yes Inactive Problems Resolved Problems Electronic Signature(s) Signed: 08/10/2020 2:54:56 PM By: Worthy Keeler PA-C Entered By: Worthy Keeler on 08/10/2020 14:54:56 Ralph Peterson ([id number]) -------------------------------------------------------------------------------- Progress Note Details Patient Name: Ralph Peterson. Date of Service: 08/10/2020 2:30 PM Medical Record Number: [id number] Patient Account Number: [id number] Date of Birth/Sex: Feb 09, 1966 (55 y.o. 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 He does have a couple callus areas that have ulcerations underneath on the left foot. 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 Fortunately there is no sign of active infection at this time which is good news he has been using Telfa pads after applying Betadine this is not really absorbing enough of the drainage however which is causing the area especially at the amputation site to be very macerated. The patient states that he actually has a work-up with Dr. Delana Meyer at [facility] next Thursday. For that reason we did not perform ABIs today and again I really am not doing any significant debridement today we will discontinue mild things to remove some of the necrotic debris  so that we hopefully get these wounds moving in the proper direction. With that being said his extremities do appear to be warm without any signs obviously of significant arterial flow. The patient does have a history of diabetes mellitus type 2, congestive heart failure, end- stage renal disease, and dependence on renal dialysis. He notes that he did have an exacerbation of heart failure recently which caused his legs to swell he had a couple wounds on his left leg but fortunately these have actually improved and are healed as of today. 05/09/2020 on evaluation today patient appears to be doing decently well all things considered in regard to his wounds. He does have the sutures out at this point in regard to his right amputation site. That does seem to be showing signs of improvement which is good news from the standpoint of at least being able to get some of the slough out although he does have quite a bit of slough buildup on the surface of the wound here. I think that is can require debridement before were able to see dramatic filling in. He may even require wound VAC at the site. With regard to the lateral foot this seems to be loosening up with the Santyl and I am pleased in that regard. In regard to the left foot everything appears to be healed and is doing great. 05/18/2020 on evaluation today patient appears to be doing well at this time in regard to his wounds. The wound on the lateral portion of his foot and the dorsal surface of his foot has been require sharp debridement of plantar aspect which was separated out from the incision site from the ray amputation actually appears to be almost completely healed and is  doing excellent. There is no signs of active infection at this time. 05/26/2020 on evaluation today patient appears to be actually showing signs of improvement in general in regard to his wounds. The plantar aspect wound does connect to the dorsal surface wound which I initially  expected but after last week was hopeful that that would not be the case. I was in fact hoping it would completely sealed up this week but it does not seem to have happened. There is no signs of active infection at this time which is great news. With that being said he does still have tender noted on the lateral aspect of the foot where the wound is there but this does not seem to be nearly as slough covered in fact I was able to carefully clean this off today and other than the fact that we need to help seal up the tendon I do not see any signs of infection at this point. 06/02/2020 upon evaluation today patient actually is making excellent improvement in regard to all of his wounds. In fact the plantar aspect of his foot looks like it may have sealed up although I cannot be [id number] certain in this regard. With that being said I do see where there was some drainage on his dressing but today I cannot find any real depth to the wound opening and it appears to be close to closed if not completely. Nonetheless I do think that the dorsal surface of the foot is doing excellent in fact we have gotten approval for snap VAC but I am not even sure that skin to be necessary he is healing so well already. In regard to the lateral foot I do feel like the collagen has been beneficial here and my recommendation is good to be that we likely continue as such with this. Tenderness still exposed but seems to be nice and moist I see no issues in that regard. 06/09/2020 on evaluation today patient appears to be doing pretty well in regard to his foot ulcers although the plantar ulcer does appear to be deeper than what I was able to measure last week that is unfortunate. The lateral foot seems to be doing okay. Fortunately there is no signs of active infection at this time. How are in regard to the blister on the lower leg on the right that opened last time this appears to be erythematous around and I feel like it is also  warm to touch I am concerned about infection here. 06/13/2020 on evaluation today patient appears to be doing excellent in regard to his lateral foot wound is not measuring a lot smaller but it does look like it is fairly healthy at this time. His culture did show no signs of growth at this point which is good news. With that being said he has been taking the antibiotic and does feel like that has helped his leg is feeling better. This is good news. There are signs of new epithelial growth as well. With regard to his plantar foot this still shows signs of some depth its really slowly closing up and again I am not to concerned but and I cannot find where it directly connects to the area above but there may be some small connections. Either way I think that potentially the goal would be to try the snap VAC today to see if this could be beneficial I think it may help to granulate in the upper wound and if we do that may be  the plantar wound will show signs of improvement a little bit more sufficiently. 06/20/2020 upon evaluation today patient appears to be doing decently well in regard to his leg wound this is good news. Unfortunately the lateral foot wound seems to be somewhat dry at this point I think we continue to go back to the [employer] [place] that seem to be doing better for him. With that being said with regard to the snap VAC on the dorsal surface of his foot it apparently did not seal well and continued to leak unfortunately. He had a lot of issues and never was able to get it to function properly since we put this on last week. Nonetheless I did advise that we could give this another try putting on a little bit differently that may allow this to be able to function appropriately. 06/27/2020 upon evaluation today patient appears to be doing very well in regard to his left foot dorsal wound. This is actually showing signs of excellent improvement and overall seems to be managing quite nicely in my opinion.  With that being said I do feel like that the plantar aspect of his foot is a little bit more significant at this time he still has some depth and we really have not gotten this to feeling very well at all. The lateral portion of the foot the Santyl does seem to be helping clear away and the leg is going require some debridement to remove some of the dead Persad, Ralph S. ([id number]) tissue and skin from around the edges of the wound but overall seems to be doing much better I think that is very close to complete closure. 07/07/2020 upon evaluation today patient appears to be doing well with regard to his wounds in general. The plantar foot ulcer is actually the most concerning that of anything worsening at this time. I do believe that he could potentially benefit from a continuation of packing in the plantar foot in order to keep this open and draining I do not want it to seal up and cause any trouble. With regard to the leg this is healed with regard to the lateral portion of his foot I think that is actually doing better there were still making some slow progress here the top of his foot looks excellent. 07/13/2020 upon evaluation today patient appears to be doing well currently with regard to his wounds in general. The best news is that the plantar wound actually seems to be showing signs of good epithelization and granulation there does not appear to be any depth like it was even last week and very pleased in this regard. The dorsal foot wound is measuring significantly smaller doing great and subsequently the patient also states that the lateral foot seems to be doing quite well. All in all I am very pleased with how things are progressing I think that he is making great progress towards completion of treatment. 08/10/2020 upon evaluation today patient in general appears to be doing well with regard to his wounds. The lateral foot seems to be doing better the dorsal foot also seems to be  doing well. Unfortunately the plantar foot is not doing nearly as well. I am much more concerned about this. It appears that he may have had an abscess in this area which was what I was concerned about all along but again it appeared to have sealed up but obviously that was not the case. Objective Constitutional Well-nourished and well-hydrated in no acute distress. Vitals Time Taken:  2:23 PM, Height: 72 in, Weight: 225 lbs, BMI: 30.5, Temperature: 98.0 F, Pulse: 80 bpm, Respiratory Rate: 18 breaths/min, Blood Pressure: 126/86 mmHg. Respiratory normal breathing without difficulty. Psychiatric this patient is able to make decisions and demonstrates good insight into disease process. Alert and Oriented x 3. pleasant and cooperative. General Notes: Upon inspection patient's wound on the lateral foot did have a little bit of tendon and had to be removed that was necrotic he tolerated that without complication post debridement this appears to be doing much better. In regard to the dorsal foot this also seems to be doing quite well and I am pleased noted significant debridement was necessary though I did remove some necrotic debris on the surface of wound down to good subcutaneous tissue. On the plantar foot I did have to perform debridement here as well and actually open what seemed to be a abscess pocket. Because there was purulent drainage I cleaned this away as best as possible and then did a deep wound culture. This cavity unfortunately is quite deep extending from the plantar surface of the foot up towards the at least subcutaneous layer of the dorsal surface of the foot in this area. This has me concerned that we may need to evaluate things further I am recommending an MRI Integumentary (Hair, Skin) Wound #1 status is Open. Original cause of wound was Pressure Injury. The wound is located on the Right,Lateral Foot. The wound measures 1.3cm length x 0.4cm width x 0.4cm depth; 0.408cm^2 area  and 0.163cm^3 volume. There is joint, tendon, and Fat Layer (Subcutaneous Tissue) exposed. There is no tunneling or undermining noted. There is a medium amount of serosanguineous drainage noted. The wound margin is distinct with the outline attached to the wound base. There is large (67-100%) red granulation within the wound bed. There is a small (1-33%) amount of necrotic tissue within the wound bed including Adherent Slough. Wound #2 status is Open. Original cause of wound was Surgical Injury. The wound is located on the Right,Dorsal Foot. The wound measures 0.4cm length x 0.1cm width x 0.1cm depth; 0.031cm^2 area and 0.003cm^3 volume. There is Fat Layer (Subcutaneous Tissue) exposed. There is no tunneling or undermining noted. There is a large amount of serosanguineous drainage noted. The wound margin is distinct with the outline attached to the wound base. There is large (67-100%) red granulation within the wound bed. There is no necrotic tissue within the wound bed. Wound #5 status is Open. Original cause of wound was Surgical Injury. The wound is located on the [place]. The wound measures 0.7cm length x 0.3cm width x 0.8cm depth; 0.165cm^2 area and 0.132cm^3 volume. There is undermining starting at 1:00 and ending at 1:00 with a maximum distance of 0.5cm. There is a medium amount of serosanguineous drainage noted. The wound margin is distinct with the outline attached to the wound base. There is no granulation within the wound bed. There is a large (67-100%) amount of necrotic tissue within the wound bed. Assessment Active Problems ICD-10 Pressure ulcer of other site, stage 3 Ralph Peterson, Ralph S. 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 There was a Excisional Skin/Subcutaneous Tissue/Muscle Debridement with a total area of 0.52 sq cm performed by Tommie Sams., PA-C. With the following instrument(s): Curette, Forceps, and Scissors to remove Viable and Non-Viable tissue/material. Material removed includes Tendon and Subcutaneous Tissue and. A time out was conducted at 15:38, prior to the start of the procedure. A Minimum amount of bleeding was controlled with Pressure. The procedure was tolerated well. Post Debridement Measurements: 1.3cm length x 0.4cm width x 0.5cm depth; 0.204cm^3 volume. Post debridement Stage noted as Category/Stage IV. Character of Wound/Ulcer Post Debridement is stable. Post procedure Diagnosis Wound #1: Same as Pre-Procedure Wound #2 Pre-procedure diagnosis of Wound #2 is an Open Surgical Wound located on the Right,Dorsal Foot . There was a Excisional Skin/Subcutaneous Tissue Debridement with a total area of 0.04 sq cm performed by Tommie Sams., PA-C. With the following instrument(s): Curette to remove Viable and Non-Viable tissue/material. Material removed includes Subcutaneous Tissue and Slough and. A time out was conducted at 15:43, prior to the start of the procedure. A Minimum amount of bleeding was controlled with Pressure. The procedure was tolerated well. Post Debridement Measurements: 0.4cm length x 0.1cm width x 0.2cm depth; 0.006cm^3 volume. Character of Wound/Ulcer Post Debridement is stable. Post procedure Diagnosis Wound #2: Same as Pre-Procedure Wound #5 Pre-procedure diagnosis of Wound #5 is a Diabetic Wound/Ulcer of the Lower Extremity located on the Right,Plantar Foot .Severity of Tissue Pre Debridement is: Fat  layer exposed. There was a Excisional Skin/Subcutaneous Tissue Debridement with a total area of 0.21 sq cm performed by Tommie Sams., PA-C. With the following instrument(s): Curette to remove Viable and Non-Viable tissue/material. Material removed includes Subcutaneous Tissue, Slough, and Skin: Epidermis. A time out was conducted at 15:46, prior to the start of the procedure. A Minimum amount of bleeding was controlled with Pressure. The procedure was tolerated well. Post Debridement Measurements: 0.7cm length x 0.3cm width x 0.9cm depth; 0.148cm^3 volume. Character of Wound/Ulcer Post Debridement is stable. Severity of Tissue Post Debridement is: Fat layer exposed. Post procedure Diagnosis Wound #5: Same as Pre-Procedure Plan Follow-up Appointments: Return Appointment in 1 week. Bathing/ Shower/ Hygiene: Clean wound with Normal Saline or wound cleanser. Off-Loading: Wound #1 Right,Lateral Foot: Open toe surgical shoe with peg assist. Wound #2 Right,Dorsal Foot: Open toe surgical shoe with peg assist. Wound #5 Right,Plantar Foot: Open toe surgical shoe with peg assist. Laboratory ordered were: Wound culture routine Radiology ordered were: Magnetic Resonance Imaging (MRI) - with and without contrast, Right foot Consults ordered were: Infectious Disease - Patient requires a referral to ID due to deep culture findings with medication interactions precluding oral therapy. The following medication(s) was prescribed: Bactrim DS oral 800 mg-160 mg tablet 1 1 tablet oral taken 2 times per day for 14 days starting 08/10/2020 WOUND #1: - Foot Wound Laterality: Right, Lateral Cleanser: Normal Saline (DME) (Generic) 3 x Per Week/30 Days Discharge Instructions: Wash your hands with soap and water. Remove old dressing, discard into plastic bag and place into trash. Cleanse the wound with Normal Saline prior to applying a clean dressing using gauze sponges, not tissues or cotton balls. Do not scrub  or use Ralph Peterson, Ralph S. ([id number]) excessive force. Pat dry using gauze sponges, not tissue or cotton balls. Primary Dressing: Prisma 4.34 (in) (Generic) 3 x Per Week/30 Days Discharge Instructions: Moisten w/normal saline or sterile water; Cover wound as directed. Do not remove from wound bed. Secondary Dressing: ABD Pad  5x9 (in/in) (Generic) 3 x Per Week/30 Days Discharge Instructions: Cover with ABD pad Secured With: Conforming Stretch Gauze Bandage 4x75 (in/in) (Generic) 3 x Per Week/30 Days Discharge Instructions: Apply as directed WOUND #2: - Foot Wound Laterality: Dorsal, Right Primary Dressing: Prisma 4.34 (in) (Generic) 3 x Per Week/30 Days Discharge Instructions: Moisten w/normal saline or sterile water; Cover wound as directed. Do not remove from wound bed. Secondary Dressing: ABD Pad 5x9 (in/in) (Generic) 3 x Per Week/30 Days Discharge Instructions: Cover with ABD pad Secured With: Conforming Stretch Gauze Bandage 4x75 (in/in) (Generic) 3 x Per Week/30 Days Discharge Instructions: Apply as directed WOUND #5: - Foot Wound Laterality: Plantar, Right Cleanser: Normal Saline (Generic) 3 x Per Week/30 Days Discharge Instructions: Wash your hands with soap and water. Remove old dressing, discard into plastic bag and place into trash. Cleanse the wound with Normal Saline prior to applying a clean dressing using gauze sponges, not tissues or cotton balls. Do not scrub or use excessive force. Pat dry using gauze sponges, not tissue or cotton balls. Primary Dressing: Silvercel Small 2x2 (in/in) (Generic) 3 x Per Week/30 Days Discharge Instructions: Apply Silvercel Small 2x2 (in/in) as instructed Secondary Dressing: ABD Pad 5x9 (in/in) (Generic) 3 x Per Week/30 Days Discharge Instructions: Cover with ABD pad Secured With: Conforming Stretch Gauze Bandage 4x75 (in/in) (Generic) 3 x Per Week/30 Days Discharge Instructions: Apply as directed 1. I would recommend currently that we  have the patient continue with the silver collagen for the dorsal foot as well as the lateral foot I think these are doing quite well. 2. With regard to the plantar foot with a pack with a silver alginate dressing. 3. I am ordering an MRI for him. This will be with and without contrast of the foot in order to see if there is something that is going on deeper with regard to bone or otherwise. He is in agreement with that plan. With that being said I did advise that when he goes for the MRI not to have the silver alginate in place as the silver will prohibit him from being able to actually go forward with the MRI. We will see patient back for reevaluation in 1 week here in the clinic. If anything worsens or changes patient will contact our office for additional recommendations. Electronic Signature(s) Signed: 08/14/2020 5:00:25 PM By: Worthy Keeler PA-C Previous Signature: 08/10/2020 5:51:35 PM Version By: Worthy Keeler PA-C Previous Signature: 08/10/2020 3:30:48 PM Version By: Worthy Keeler PA-C Previous Signature: 08/10/2020 3:27:03 PM Version By: Worthy Keeler PA-C Entered By: Worthy Keeler on 08/14/2020 17:00:25 Ralph Peterson ([id number]) -------------------------------------------------------------------------------- SuperBill Details Patient Name: Ralph Peterson Date of Service: 08/10/2020 Medical Record Number: [id number] Patient Account Number: [id number] Date of Birth/Sex: [birth year] (55 y.o. M) Treating RN: Cornell Barman Primary Care Provider: Vidal Schwalbe Other Clinician: Referring Provider: Vidal Schwalbe Treating Provider/Extender: Skipper Cliche in Treatment: 15 Diagnosis Coding ICD-10 Codes Code Description [phone] Pressure ulcer of other site, stage 3 T81.31XA Disruption of external operation (surgical) wound, not elsewhere classified, initial encounter 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d L97.812  Non-pressure chronic ulcer of other part of right lower leg with fat layer exposed E11.621 Type 2 diabetes mellitus with foot ulcer I50.42 Chronic combined systolic (congestive) and diastolic (congestive) heart failure N18.6 End stage renal disease Z99.2 Dependence on renal dialysis Facility Procedures CPT4 Code: [id number] Description: 11042 - DEB SUBQ TISSUE 20 SQ CM/<  Modifier: Quantity: 1 CPT4 Code: Description: ICD-10 Diagnosis Description 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 T81.31XA Disruption of external operation (surgical) wound, not elsewhere classifie L89.893 Pressure ulcer of other site, stage 3 Modifier: sed d, initial encounter Quantity: CPT4 Code: [id number] Description: 11043 - DEB MUSC/FASCIA 20 SQ CM/< Modifier: Quantity: 1 CPT4 Code: Description: ICD-10 Diagnosis Description 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 T81.31XA Disruption of external operation (surgical) wound, not elsewhere classifie L89.893 Pressure ulcer of other site, stage 3 Modifier: sed d, initial encounter Quantity: Physician Procedures CPT4 [id number] Description: 99214 - WC PHYS LEVEL 4 - EST PT Modifier: 25 Quantity: 1 CPT4 Code: Description: ICD-10 Diagnosis Description L89.893 Pressure ulcer of other site, stage 3 T81.31XA Disruption of external operation (surgical) wound, not elsewhere classified, L97.512 Non-pressure chronic ulcer of other part of right foot with fat layer  expose L97.812 Non-pressure chronic ulcer of other part of right lower leg with fat layer e Modifier: initial encounter d xposed Quantity: CPT4 Code: [id number] Description: 11042 - WC PHYS SUBQ TISS 20 SQ CM Modifier: Quantity: 1 CPT4 Code: Description: ICD-10 Diagnosis Description 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 T81.31XA Disruption of external operation (surgical) wound, not  elsewhere classified, L89.893 Pressure ulcer of other site,  stage 3 Modifier: d initial encounter Quantity: CPT4 Code: [id number] Description: 11043 - WC PHYS DEBR MUSCLE/FASCIA 20 SQ CM Modifier: Quantity: 1 CPT4 Code: Ralph Peterson, Ralph Peterson Description: ICD-10 Diagnosis Description 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 ([id number]) Modifier: d initial encounter Quantity: Electronic Signature(s) Signed: 08/10/2020 5:52:55 PM By: Worthy Keeler PA-C Previous Signature: 08/10/2020 5:52:35 PM Version By: Worthy Keeler PA-C Entered By: Worthy Keeler on 08/10/2020 17:52:54

## 2020-08-11 ENCOUNTER — Other Ambulatory Visit
Admission: RE | Admit: 2020-08-11 | Discharge: 2020-08-11 | Disposition: A | Discharge status: Home / Self Care | Payer: Medicare PPO | Source: Ambulatory Visit | Attending: Vascular Surgery | Admitting: Vascular Surgery

## 2020-08-11 ENCOUNTER — Encounter (INDEPENDENT_AMBULATORY_CARE_PROVIDER_SITE_OTHER): Payer: Self-pay | Admitting: Vascular Surgery

## 2020-08-11 DIAGNOSIS — Z01812 Encounter for preprocedural laboratory examination: Secondary | ICD-10-CM | POA: Diagnosis not present

## 2020-08-11 NOTE — Progress Notes (Signed)
Ralph Peterson, Ralph Peterson ([id number]) Visit Report for 08/10/2020 Arrival Information Details Patient Name: Ralph Peterson, Ralph Peterson. Date of Service: 08/10/2020 2:30 PM Medical Record Number: [id number] Patient Account Number: [id number] Date of Birth/Sex: 02/06/1966 (55 y.o. M) Treating RN: Cornell Barman Primary Care Aimee Heldman: Vidal Schwalbe Other Clinician: Referring Marylon Verno: Vidal Schwalbe Treating Novia Lansberry/Extender: Skipper Cliche in Treatment: 15 Visit Information History Since Last Visit Added or deleted any medications: No Patient Arrived: Wheel Chair Has Dressing in Place as Prescribed: Yes Arrival Time: 14:38 Has Footwear/Offloading in Place as Prescribed: Yes Accompanied By: son Right: Wedge Shoe Transfer Assistance: None Pain Present Now: No Patient Identification Verified: Yes Secondary Verification Process Completed: Yes Patient Requires Transmission-Based Precautions: No Patient Has Alerts: No Electronic Signature(s) Signed: 08/11/2020 9:53:32 AM By: Gretta Cool, BSN, RN, CWS, Kim RN, BSN Entered By: Gretta Cool, BSN, RN, CWS, Kim on 08/10/2020 14:39:11 Ralph Peterson ([id number]) -------------------------------------------------------------------------------- Encounter Discharge Information Details Patient Name: Ralph Peterson. Date of Service: 08/10/2020 2:30 PM Medical Record Number: [id number] Patient Account Number: [id number] Date of Birth/Sex: 05-01-1966 (55 y.o. M) Treating RN: Dolan Amen Primary Care Alishea Beaudin: Vidal Schwalbe Other Clinician: Referring Jon Lall: Vidal Schwalbe Treating Naraly Fritcher/Extender: Skipper Cliche in Treatment: 15 Encounter Discharge Information Items Post Procedure Vitals Discharge Condition: Stable Temperature (F): 98.0 Ambulatory Status: Wheelchair Pulse (bpm): 80 Discharge Destination: Home Respiratory Rate (breaths/min): 18 Transportation: Private Auto Blood Pressure (mmHg): 126/86 Accompanied By: son Schedule Follow-up  Appointment: Yes Clinical Summary of Care: Electronic Signature(s) Signed: 08/10/2020 4:30:45 PM By: Georges Mouse, Minus Breeding RN Entered By: Georges Mouse, Kenia on 08/10/2020 16:30:45 Ralph Peterson ([id number]) -------------------------------------------------------------------------------- Lower Extremity Assessment Details Patient Name: Ralph Peterson. Date of Service: 08/10/2020 2:30 PM Medical Record Number: [id number] Patient Account Number: [id number] Date of Birth/Sex: Jan 05, 1966 (55 y.o. M) Treating RN: Cornell Barman Primary Care Tyren Dugar: Vidal Schwalbe Other Clinician: Referring Danialle Dement: Vidal Schwalbe Treating Laurieanne Galloway/Extender: Skipper Cliche in Treatment: 15 Vascular Assessment Pulses: Dorsalis Pedis Palpable: [Right:Yes] Electronic Signature(s) Signed: 08/11/2020 9:53:32 AM By: Gretta Cool, BSN, RN, CWS, Kim RN, BSN Entered By: Gretta Cool, BSN, RN, CWS, Kim on 08/10/2020 14:51:08 Ralph Peterson ([id number]) -------------------------------------------------------------------------------- Multi Wound Chart Details Patient Name: Ralph Peterson Date of Service: 08/10/2020 2:30 PM Medical Record Number: [id number] Patient Account Number: [id number] Date of Birth/Sex: 08-27-1965 (55 y.o. M) Treating RN: Dolan Amen Primary Care Rai Severns: Vidal Schwalbe Other Clinician: Referring Audia Amick: Vidal Schwalbe Treating Ingra Rother/Extender: Skipper Cliche in Treatment: 15 Vital Signs Height(in): 72 Pulse(bpm): 80 Weight(lbs): 225 Blood Pressure(mmHg): 126/86 Body Mass Index(BMI): 31 Temperature(F): 98.0 Respiratory Rate(breaths/min): 18 Photos: [1:No Photos] [2:No Photos] Wound Location: Right, Lateral Foot Right, Dorsal Foot Right, Plantar Foot Wounding Event: Pressure Injury Surgical Injury Surgical Injury Primary Etiology: Pressure Ulcer Open Surgical Wound Diabetic Wound/Ulcer of the Lower Extremity Comorbid History: N/A N/A Arrhythmia,  Congestive Heart Failure, Coronary Artery Disease, Hypertension, Myocardial Infarction, Peripheral Venous Disease, Type II Diabetes, Neuropathy Date Acquired: 02/20/2020 04/20/2020 03/24/2020 Weeks of Treatment: '15 15 12 '$ Wound Status: Open Open Open Measurements L x W x D (cm) 1.3x0.4x0.4 0.4x0.1x0.1 0.7x0.3x0.8 Area (cm) : 0.408 0.031 0.165 Volume (cm) : 0.163 0.003 0.132 % Reduction in Area: 91.90% 99.30% 75.30% % Reduction in Volume: 93.60% 99.80% 60.50% Starting Position 1 (o'clock): 1 Ending Position 1 (o'clock): 1 Maximum Distance 1 (cm): 0.5 Undermining: N/A N/A Yes Classification: Category/Stage IV Full Thickness Without Exposed Grade 2 Support Structures Exudate Amount: N/A N/A Medium Exudate Type: N/A N/A Serosanguineous Exudate Color: N/A N/A red, brown Wound Margin: N/A N/A Distinct, outline  attached Granulation Amount: N/A N/A None Present (0%) Necrotic Amount: N/A N/A Large (67-100%) Epithelialization: N/A N/A None Treatment Notes Electronic Signature(s) Signed: 08/10/2020 4:30:59 PM By: Georges Mouse, Minus Breeding RN Entered By: Georges Mouse, Minus Breeding on 08/10/2020 15:38:41 Ralph Peterson ([id number]) -------------------------------------------------------------------------------- [place] Details Patient Name: Ralph Peterson Date of Service: 08/10/2020 2:30 PM Medical Record Number: [id number] Patient Account Number: [id number] Date of Birth/Sex: [birth year] (55 y.o. M) Treating RN: Dolan Amen Primary Care Athanasia Stanwood: Vidal Schwalbe Other Clinician: Referring Shanzay Hepworth: Vidal Schwalbe Treating Jeany Seville/Extender: Skipper Cliche in Treatment: 15 Active Inactive Necrotic Tissue Nursing Diagnoses: Impaired tissue integrity related to necrotic/devitalized tissue Knowledge deficit related to management of necrotic/devitalized tissue Goals: Necrotic/devitalized tissue will be minimized in the wound bed Date Initiated:  05/09/2020 Target Resolution Date: 05/09/2020 Goal Status: Active Interventions: Assess patient pain level pre-, during and post procedure and prior to discharge Provide education on necrotic tissue and debridement process Treatment Activities: Enzymatic debridement : 05/09/2020 Notes: Wound/Skin Impairment Nursing Diagnoses: Impaired tissue integrity Knowledge deficit related to ulceration/compromised skin integrity Goals: Patient/caregiver will verbalize understanding of skin care regimen Date Initiated: 04/27/2020 Target Resolution Date: 05/25/2020 Goal Status: Active Interventions: Assess patient/caregiver ability to obtain necessary supplies Assess patient/caregiver ability to perform ulcer/skin care regimen upon admission and as needed Assess ulceration(s) every visit Provide education on ulcer and skin care Treatment Activities: Skin care regimen initiated : 04/27/2020 Topical wound management initiated : 04/27/2020 Notes: Electronic Signature(s) Signed: 08/10/2020 4:30:59 PM By: Georges Mouse, Minus Breeding RN Entered By: Georges Mouse, Minus Breeding on 08/10/2020 15:38:00 Ralph Peterson ([id number]) -------------------------------------------------------------------------------- Pain Assessment Details Patient Name: Ralph Peterson Date of Service: 08/10/2020 2:30 PM Medical Record Number: [id number] Patient Account Number: [id number] Date of Birth/Sex: [birth year] (55 y.o. M) Treating RN: Cornell Barman Primary Care Travion Ke: Vidal Schwalbe Other Clinician: Referring Kavonte Bearse: Vidal Schwalbe Treating Maridee Slape/Extender: Skipper Cliche in Treatment: 15 Active Problems Location of Pain Severity and Description of Pain Patient Has Paino No Site Locations Pain Management and Medication Current Pain Management: Electronic Signature(s) Signed: 08/11/2020 9:53:32 AM By: Gretta Cool, BSN, RN, CWS, Kim RN, BSN Entered By: Gretta Cool, BSN, RN, CWS, Kim on 08/10/2020 14:43:35 Ralph Peterson ([id number]) -------------------------------------------------------------------------------- Patient/Caregiver Education Details Patient Name: Ralph Peterson Date of Service: 08/10/2020 2:30 PM Medical Record Number: [id number] Patient Account Number: [id number] Date of Birth/Gender: 11/18/1965 (55 y.o. M) Treating RN: Dolan Amen Primary Care Physician: Vidal Schwalbe Other Clinician: Referring Physician: Vidal Schwalbe Treating Physician/Extender: Skipper Cliche in Treatment: 15 Education Assessment Education Provided To: Patient Education Topics Provided Wound/Skin Impairment: Methods: Explain/Verbal Responses: State content correctly Electronic Signature(s) Signed: 08/10/2020 4:30:59 PM By: Georges Mouse, Minus Breeding RN Entered By: Georges Mouse, Minus Breeding on 08/10/2020 16:29:37 Ralph Peterson ([id number]) -------------------------------------------------------------------------------- Wound Assessment Details Patient Name: Ralph Peterson Date of Service: 08/10/2020 2:30 PM Medical Record Number: [id number] Patient Account Number: [id number] Date of Birth/Sex: 12/03/1965 (55 y.o. M) Treating RN: Cornell Barman Primary Care Jessee Mezera: Vidal Schwalbe Other Clinician: Referring Taren Dymek: Vidal Schwalbe Treating Arshiya Jakes/Extender: Skipper Cliche in Treatment: 15 Wound Status Wound Number: 1 Primary Pressure Ulcer Etiology: Wound Location: Right, Lateral Foot Wound Open Wounding Event: Pressure Injury Status: Date Acquired: 02/20/2020 Comorbid Arrhythmia, Congestive Heart Failure, Coronary Artery Weeks Of Treatment: 15 History: Disease, Hypertension, Myocardial Infarction, Peripheral Clustered Wound: No Venous Disease, Type II Diabetes, Neuropathy Photos Photo Uploaded By: Gretta Cool, BSN, RN, CWS, Kim on 08/10/2020 17:36:36 Wound Measurements Length: (cm) 1.3 Width: (cm) 0.4 Depth: (cm) 0.4 Area: (cm) 0.408 Volume: (cm)  0.163 % Reduction  in Area: 91.9% % Reduction in Volume: 93.6% Epithelialization: None Tunneling: No Undermining: No Wound Description Classification: Category/Stage IV Wound Margin: Distinct, outline attached Exudate Amount: Medium Exudate Type: Serosanguineous Exudate Color: red, brown Foul Odor After Cleansing: No Slough/Fibrino Yes Wound Bed Granulation Amount: Large (67-100%) Exposed Structure Granulation Quality: Red Fascia Exposed: No Necrotic Amount: Small (1-33%) Fat Layer (Subcutaneous Tissue) Exposed: Yes Necrotic Quality: Adherent Slough Tendon Exposed: Yes Muscle Exposed: No Joint Exposed: Yes Bone Exposed: No Treatment Notes Wound #1 (Foot) Wound Laterality: Right, Lateral Cleanser Normal Saline Discharge Instruction: Wash your hands with soap and water. Remove old dressing, discard into plastic bag and place into trash. Cleanse the wound with Normal Saline prior to applying a clean dressing using gauze sponges, not tissues or cotton balls. Do not [place], Ralph S. ([id number]) scrub or use excessive force. Pat dry using gauze sponges, not tissue or cotton balls. Peri-Wound Care Topical Primary Dressing Prisma 4.34 (in) Discharge Instruction: Moisten w/normal saline or sterile water; Cover wound as directed. Do not remove from wound bed. Secondary Dressing ABD Pad 5x9 (in/in) Discharge Instruction: Cover with ABD pad Secured With Conforming Stretch Gauze Bandage 4x75 (in/in) Discharge Instruction: Apply as directed Compression Wrap Compression Stockings Add-Ons Electronic Signature(s) Signed: 08/10/2020 4:30:59 PM By: Charlett Nose RN Signed: 08/11/2020 9:53:32 AM By: Gretta Cool, BSN, RN, CWS, Kim RN, BSN Entered By: Georges Mouse, Kenia on 08/10/2020 16:14:37 Ralph Peterson ([id number]) -------------------------------------------------------------------------------- Wound Assessment Details Patient Name: Ralph Peterson. Date of Service:  08/10/2020 2:30 PM Medical Record Number: [id number] Patient Account Number: [id number] Date of Birth/Sex: 06/06/1966 (55 y.o. M) Treating RN: Cornell Barman Primary Care Zina Pitzer: Vidal Schwalbe Other Clinician: Referring Marzella Miracle: Vidal Schwalbe Treating Skylor Schnapp/Extender: Skipper Cliche in Treatment: 15 Wound Status Wound Number: 2 Primary Open Surgical Wound Etiology: Wound Location: Right, Dorsal Foot Wound Open Wounding Event: Surgical Injury Status: Date Acquired: 04/20/2020 Comorbid Arrhythmia, Congestive Heart Failure, Coronary Artery Weeks Of Treatment: 15 History: Disease, Hypertension, Myocardial Infarction, Peripheral Clustered Wound: No Venous Disease, Type II Diabetes, Neuropathy Photos Photo Uploaded By: Gretta Cool, BSN, RN, CWS, Kim on 08/10/2020 17:36:36 Wound Measurements Length: (cm) 0.4 Width: (cm) 0.1 Depth: (cm) 0.1 Area: (cm) 0.031 Volume: (cm) 0.003 % Reduction in Area: 99.3% % Reduction in Volume: 99.8% Epithelialization: Small (1-33%) Tunneling: No Undermining: No Wound Description Classification: Full Thickness Without Exposed Support Structu Wound Margin: Distinct, outline attached Exudate Amount: Large Exudate Type: Serosanguineous Exudate Color: red, brown res Foul Odor After Cleansing: No Slough/Fibrino Yes Wound Bed Granulation Amount: Large (67-100%) Exposed Structure Granulation Quality: Red Fascia Exposed: No Necrotic Amount: None Present (0%) Fat Layer (Subcutaneous Tissue) Exposed: Yes Tendon Exposed: No Muscle Exposed: No Joint Exposed: No Bone Exposed: No Treatment Notes Wound #2 (Foot) Wound Laterality: Dorsal, Right Cleanser Peri-Wound Care Ralph Peterson, Ralph Peterson ([id number]) Topical Primary Dressing Prisma 4.34 (in) Discharge Instruction: Moisten w/normal saline or sterile water; Cover wound as directed. Do not remove from wound bed. Secondary Dressing ABD Pad 5x9 (in/in) Discharge Instruction: Cover with ABD  pad Secured With Conforming Stretch Gauze Bandage 4x75 (in/in) Discharge Instruction: Apply as directed Compression Wrap Compression Stockings Add-Ons Electronic Signature(s) Signed: 08/10/2020 4:30:59 PM By: Charlett Nose RN Signed: 08/11/2020 9:53:32 AM By: Gretta Cool, BSN, RN, CWS, Kim RN, BSN Entered By: Georges Mouse, Kenia on 08/10/2020 16:14:53 Ralph Peterson ([id number]) -------------------------------------------------------------------------------- Wound Assessment Details Patient Name: Ralph Peterson. Date of Service: 08/10/2020 2:30 PM Medical Record Number: [id number] Patient Account Number: [id number] Date of  Birth/Sex: Jun 30, 1966 (55 y.o. M) Treating RN: Cornell Barman Primary Care Xadrian Craighead: Vidal Schwalbe Other Clinician: Referring Cleto Claggett: Vidal Schwalbe Treating Phebe Dettmer/Extender: Skipper Cliche in Treatment: 15 Wound Status Wound Number: 5 Primary Diabetic Wound/Ulcer of the Lower Extremity Etiology: Wound Location: Right, Plantar Foot Wound Open Wounding Event: Surgical Injury Status: Date Acquired: 03/24/2020 Comorbid Arrhythmia, Congestive Heart Failure, Coronary Artery Weeks Of Treatment: 12 History: Disease, Hypertension, Myocardial Infarction, Peripheral Clustered Wound: No Venous Disease, Type II Diabetes, Neuropathy Photos Wound Measurements Length: (cm) 0.7 % Reduc Width: (cm) 0.3 % Reduc Depth: (cm) 0.8 Epithel Area: (cm) 0.165 Underm Volume: (cm) 0.132 Sta Endi Maxi tion in Area: 75.3% tion in Volume: 60.5% ialization: None ining: Yes rting Position (o'clock): 1 ng Position (o'clock): 1 mum Distance: (cm) 0.5 Wound Description Classification: Grade 2 Foul Od Wound Margin: Distinct, outline attached Slough/ Exudate Amount: Medium Exudate Type: Serosanguineous Exudate Color: red, brown or After Cleansing: No Fibrino No Wound Bed Granulation Amount: None Present (0%) Exposed Structure Necrotic Amount: Large  (67-100%) Fascia Exposed: No Fat Layer (Subcutaneous Tissue) Exposed: No Tendon Exposed: No Muscle Exposed: No Joint Exposed: No Bone Exposed: No Treatment Notes Wound #5 (Foot) Wound Laterality: Plantar, Right Cleanser Normal Saline Ralph Peterson, Ralph Peterson ([id number]) Discharge Instruction: Wash your hands with soap and water. Remove old dressing, discard into plastic bag and place into trash. Cleanse the wound with Normal Saline prior to applying a clean dressing using gauze sponges, not tissues or cotton balls. Do not scrub or use excessive force. Pat dry using gauze sponges, not tissue or cotton balls. Peri-Wound Care Topical Primary Dressing Silver[place] Small 2x2 (in/in) Discharge Instruction: Apply [place] Small 2x2 (in/in) as instructed Secondary Dressing ABD Pad 5x9 (in/in) Discharge Instruction: Cover with ABD pad Secured With Conforming Stretch Gauze Bandage 4x75 (in/in) Discharge Instruction: Apply as directed Compression Wrap Compression Stockings Add-Ons Electronic Signature(s) Signed: 08/11/2020 9:53:32 AM By: Gretta Cool, BSN, RN, CWS, Kim RN, BSN Entered By: Gretta Cool, BSN, RN, CWS, Kim on 08/10/2020 14:49:06 Ralph Peterson ([id number]) -------------------------------------------------------------------------------- [place] Details Patient Name: Ralph Peterson. Date of Service: 08/10/2020 2:30 PM Medical Record Number: [id number] Patient Account Number: [id number] Date of Birth/Sex: Oct 11, 1965 (55 y.o. M) Treating RN: Cornell Barman Primary Care Lillionna Nabi: Vidal Schwalbe Other Clinician: Referring Yameli Delamater: Vidal Schwalbe Treating Channelle Bottger/Extender: Skipper Cliche in Treatment: 15 Vital Signs Time Taken: 14:23 Temperature (F): 98.0 Height (in): 72 Pulse (bpm): 80 Weight (lbs): 225 Respiratory Rate (breaths/min): 18 Body Mass Index (BMI): 30.5 Blood Pressure (mmHg): 126/86 Reference Range: 80 - 120 mg / dl Electronic Signature(s) Signed: 08/11/2020  9:53:32 AM By: Gretta Cool, BSN, RN, CWS, Kim RN, BSN Entered By: Gretta Cool, BSN, RN, CWS, Kim on 08/10/2020 14:43:29

## 2020-08-12 LAB — SARS CORONAVIRUS 2 (TAT 6-24 HRS): SARS Coronavirus 2: NEGATIVE

## 2020-08-13 LAB — AEROBIC CULTURE W GRAM STAIN (SUPERFICIAL SPECIMEN)

## 2020-08-14 ENCOUNTER — Other Ambulatory Visit: Payer: Self-pay | Admitting: Physician Assistant

## 2020-08-14 ENCOUNTER — Other Ambulatory Visit (HOSPITAL_COMMUNITY): Payer: Self-pay | Admitting: Physician Assistant

## 2020-08-14 DIAGNOSIS — M869 Osteomyelitis, unspecified: Secondary | ICD-10-CM

## 2020-08-14 DIAGNOSIS — L97512 Non-pressure chronic ulcer of other part of right foot with fat layer exposed: Secondary | ICD-10-CM

## 2020-08-15 ENCOUNTER — Encounter: Payer: Self-pay | Admitting: Vascular Surgery

## 2020-08-15 ENCOUNTER — Other Ambulatory Visit (INDEPENDENT_AMBULATORY_CARE_PROVIDER_SITE_OTHER): Payer: Self-pay | Admitting: Nurse Practitioner

## 2020-08-15 ENCOUNTER — Ambulatory Visit: Payer: Medicare PPO

## 2020-08-15 ENCOUNTER — Ambulatory Visit
Admission: RE | Admit: 2020-08-15 | Discharge: 2020-08-15 | Disposition: A | Discharge status: Home / Self Care | Payer: Medicare PPO | Source: Ambulatory Visit | Attending: Vascular Surgery | Admitting: Vascular Surgery

## 2020-08-15 ENCOUNTER — Other Ambulatory Visit: Payer: Self-pay

## 2020-08-15 ENCOUNTER — Encounter
Admission: RE | Disposition: A | Discharge status: Home / Self Care | Payer: Self-pay | Source: Ambulatory Visit | Attending: Vascular Surgery

## 2020-08-15 DIAGNOSIS — N186 End stage renal disease: Secondary | ICD-10-CM | POA: Diagnosis not present

## 2020-08-15 DIAGNOSIS — I70261 Atherosclerosis of native arteries of extremities with gangrene, right leg: Secondary | ICD-10-CM | POA: Insufficient documentation

## 2020-08-15 DIAGNOSIS — I25118 Atherosclerotic heart disease of native coronary artery with other forms of angina pectoris: Secondary | ICD-10-CM | POA: Insufficient documentation

## 2020-08-15 DIAGNOSIS — Z87891 Personal history of nicotine dependence: Secondary | ICD-10-CM | POA: Insufficient documentation

## 2020-08-15 DIAGNOSIS — Z992 Dependence on renal dialysis: Secondary | ICD-10-CM | POA: Diagnosis not present

## 2020-08-15 DIAGNOSIS — I70299 Other atherosclerosis of native arteries of extremities, unspecified extremity: Secondary | ICD-10-CM

## 2020-08-15 DIAGNOSIS — L97519 Non-pressure chronic ulcer of other part of right foot with unspecified severity: Secondary | ICD-10-CM | POA: Insufficient documentation

## 2020-08-15 DIAGNOSIS — Z880 Allergy status to penicillin: Secondary | ICD-10-CM | POA: Diagnosis not present

## 2020-08-15 DIAGNOSIS — Z885 Allergy status to narcotic agent status: Secondary | ICD-10-CM | POA: Insufficient documentation

## 2020-08-15 DIAGNOSIS — I70235 Atherosclerosis of native arteries of right leg with ulceration of other part of foot: Secondary | ICD-10-CM | POA: Insufficient documentation

## 2020-08-15 DIAGNOSIS — I120 Hypertensive chronic kidney disease with stage 5 chronic kidney disease or end stage renal disease: Secondary | ICD-10-CM | POA: Diagnosis not present

## 2020-08-15 DIAGNOSIS — I4891 Unspecified atrial fibrillation: Secondary | ICD-10-CM | POA: Diagnosis not present

## 2020-08-15 DIAGNOSIS — L97909 Non-pressure chronic ulcer of unspecified part of unspecified lower leg with unspecified severity: Secondary | ICD-10-CM

## 2020-08-15 DIAGNOSIS — I7025 Atherosclerosis of native arteries of other extremities with ulceration: Secondary | ICD-10-CM | POA: Diagnosis not present

## 2020-08-15 HISTORY — PX: LOWER EXTREMITY ANGIOGRAPHY: CATH118251

## 2020-08-15 LAB — CREATININE, SERUM
Creatinine, Ser: 4.81 mg/dL — ABNORMAL HIGH (ref 0.61–1.24)
GFR, Estimated: 14 mL/min — ABNORMAL LOW (ref >60)

## 2020-08-15 LAB — GLUCOSE, CAPILLARY
Glucose-Capillary: 112 mg/dL — ABNORMAL HIGH (ref 70–99)
Glucose-Capillary: 87 mg/dL (ref 70–99)
Glucose-Capillary: 101 mg/dL — ABNORMAL HIGH (ref 70–99)

## 2020-08-15 LAB — BUN: BUN: 41 mg/dL — ABNORMAL HIGH (ref 6–20)

## 2020-08-15 LAB — POTASSIUM (ARMC VASCULAR LAB ONLY): Potassium (ARMC vascular lab): 4.6 (ref 3.5–5.1)

## 2020-08-15 SURGERY — LOWER EXTREMITY ANGIOGRAPHY
Anesthesia: Moderate Sedation | Site: Leg Lower | Laterality: Right

## 2020-08-15 MED ORDER — SODIUM CHLORIDE 0.9 % IV SOLN: INTRAVENOUS | Status: DC

## 2020-08-15 MED ORDER — CLINDAMYCIN PHOSPHATE 300 MG/50ML IV SOLN
300.0000 mg | Freq: Once | INTRAVENOUS | Status: AC
  Administered 2020-08-15: 300 mg via INTRAVENOUS

## 2020-08-15 MED ORDER — MORPHINE SULFATE (PF) 4 MG/ML IV SOLN: 2.0000 mg | INTRAVENOUS | Status: DC | PRN

## 2020-08-15 MED ORDER — HEPARIN SODIUM (PORCINE) 1000 UNIT/ML IJ SOLN
INTRAMUSCULAR | Status: DC | PRN
  Administered 2020-08-15: 5000 [IU] via INTRAVENOUS

## 2020-08-15 MED ORDER — IODIXANOL 320 MG/ML IV SOLN
INTRAVENOUS | Status: DC | PRN
  Administered 2020-08-15: 70 mL

## 2020-08-15 MED ORDER — HEPARIN SODIUM (PORCINE) 1000 UNIT/ML IJ SOLN
INTRAMUSCULAR | Status: AC
  Filled 2020-08-15: qty 1

## 2020-08-15 MED ORDER — ACETAMINOPHEN 325 MG PO TABS: 650.0000 mg | ORAL_TABLET | ORAL | Status: DC | PRN

## 2020-08-15 MED ORDER — CLINDAMYCIN PHOSPHATE 300 MG/50ML IV SOLN
INTRAVENOUS | Status: AC
  Filled 2020-08-15: qty 50

## 2020-08-15 MED ORDER — FENTANYL CITRATE (PF) 100 MCG/2ML IJ SOLN: 12.5000 ug | Freq: Once | INTRAMUSCULAR | Status: DC | PRN

## 2020-08-15 MED ORDER — MIDAZOLAM HCL 5 MG/5ML IJ SOLN
INTRAMUSCULAR | Status: AC
  Administered 2020-08-15: 2 mg
  Filled 2020-08-15: qty 5

## 2020-08-15 MED ORDER — ONDANSETRON HCL 4 MG/2ML IJ SOLN: 4.0000 mg | Freq: Four times a day (QID) | INTRAMUSCULAR | Status: DC | PRN

## 2020-08-15 MED ORDER — SODIUM CHLORIDE 0.9% FLUSH: 3.0000 mL | Freq: Two times a day (BID) | INTRAVENOUS | Status: DC

## 2020-08-15 MED ORDER — FENTANYL CITRATE (PF) 100 MCG/2ML IJ SOLN
INTRAMUSCULAR | Status: AC
  Administered 2020-08-15: 50 ug via INTRAMUSCULAR
  Filled 2020-08-15: qty 2

## 2020-08-15 MED ORDER — DIPHENHYDRAMINE HCL 50 MG/ML IJ SOLN: 50.0000 mg | Freq: Once | INTRAMUSCULAR | Status: DC | PRN

## 2020-08-15 MED ORDER — SODIUM CHLORIDE 0.9 % IV SOLN: 250.0000 mL | INTRAVENOUS | Status: DC | PRN

## 2020-08-15 MED ORDER — SODIUM CHLORIDE 0.9% FLUSH: 3.0000 mL | INTRAVENOUS | Status: DC | PRN

## 2020-08-15 MED ORDER — FENTANYL CITRATE (PF) 100 MCG/2ML IJ SOLN
INTRAMUSCULAR | Status: DC | PRN
  Administered 2020-08-15: 50 ug via INTRAVENOUS

## 2020-08-15 MED ORDER — MIDAZOLAM HCL 2 MG/ML PO SYRP: 8.0000 mg | ORAL_SOLUTION | Freq: Once | ORAL | Status: DC | PRN

## 2020-08-15 MED ORDER — MIDAZOLAM HCL 2 MG/2ML IJ SOLN
INTRAMUSCULAR | Status: DC | PRN
  Administered 2020-08-15: 1 mg via INTRAVENOUS

## 2020-08-15 MED ORDER — METHYLPREDNISOLONE SODIUM SUCC 125 MG IJ SOLR: 125.0000 mg | Freq: Once | INTRAMUSCULAR | Status: DC | PRN

## 2020-08-15 MED ORDER — FAMOTIDINE 20 MG PO TABS: 40.0000 mg | ORAL_TABLET | Freq: Once | ORAL | Status: DC | PRN

## 2020-08-15 SURGICAL SUPPLY — 20 items
CANNULA 5F STIFF (CANNULA) ×2 IMPLANT
CATH CROSSER S6 154CM (CATHETERS) ×2 IMPLANT
CATH PIG 70CM (CATHETERS) ×2 IMPLANT
CATH SEEKER .018X150 (CATHETERS) ×2 IMPLANT
CATH USHER TPER 130CM (CATHETERS) ×2 IMPLANT
CATH VERT 5FR 125CM (CATHETERS) ×2 IMPLANT
DEVICE STARCLOSE SE CLOSURE (Vascular Products) ×2 IMPLANT
GLIDEWIRE ADV .035X260CM (WIRE) ×2 IMPLANT
GUIDEWIRE PFTE-COATED .018X300 (WIRE) ×2 IMPLANT
KIT ENCORE 26 ADVANTAGE (KITS) ×2 IMPLANT
KIT FLOWMATE PROCEDURAL (KITS) ×2 IMPLANT
NEEDLE ENTRY 21GA 7CM ECHOTIP (NEEDLE) ×2 IMPLANT
PACK ANGIOGRAPHY (CUSTOM PROCEDURE TRAY) ×2 IMPLANT
SET INTRO CAPELLA COAXIAL (SET/KITS/TRAYS/PACK) ×2 IMPLANT
SHEATH BRITE TIP 5FRX11 (SHEATH) ×2 IMPLANT
SHEATH RAABE 6FR (SHEATH) ×2 IMPLANT
SYR MEDRAD MARK 7 150ML (SYRINGE) ×2 IMPLANT
TUBING CONTRAST HIGH PRESS 48 (TUBING) ×4 IMPLANT
WIRE G V18X300CM (WIRE) ×2 IMPLANT
WIRE GUIDERIGHT .035X150 (WIRE) ×2 IMPLANT

## 2020-08-15 NOTE — Progress Notes (Signed)
Pt given sprite to drink. 

## 2020-08-15 NOTE — Interval H&P Note (Signed)
History and Physical Interval Note:  08/15/2020 9:56 AM  Ralph Peterson  has presented today for surgery, with the diagnosis of RT lower extremity angio   ASO w ulceration Covid  Jan 21.  The various methods of treatment have been discussed with the patient and family. After consideration of risks, benefits and other options for treatment, the patient has consented to  Procedure(s): LOWER EXTREMITY ANGIOGRAPHY (Right) as a surgical intervention.  The patient's history has been reviewed, patient examined, no change in status, stable for surgery.  I have reviewed the patient's chart and labs.  Questions were answered to the patient's satisfaction.     Hortencia Pilar

## 2020-08-15 NOTE — Discharge Instructions (Signed)
Femoral Site Care  This sheet gives you information about how to care for yourself after your procedure. Your health care provider may also give you more specific instructions. If you have problems or questions, contact your health care provider. What can I expect after the procedure? After the procedure, it is common to have:  Bruising that usually fades within 1-2 weeks.  Tenderness at the site. Follow these instructions at home: Wound care  Follow instructions from your health care provider about how to take care of your insertion site. Make sure you: ? Wash your hands with soap and water before you change your bandage (dressing). If soap and water are not available, use hand sanitizer. ? Change your dressing as told by your health care provider. ? Leave stitches (sutures), skin glue, or adhesive strips in place. These skin closures may need to stay in place for 2 weeks or longer. If adhesive strip edges start to loosen and curl up, you may trim the loose edges. Do not remove adhesive strips completely unless your health care provider tells you to do that.  Do not take baths, swim, or use a hot tub until your health care provider approves.  You may shower 24-48 hours after the procedure or as told by your health care provider. ? Gently wash the site with plain soap and water. ? Pat the area dry with a clean towel. ? Do not rub the site. This may cause bleeding.  Do not apply powder or lotion to the site. Keep the site clean and dry.  Check your femoral site every day for signs of infection. Check for: ? Redness, swelling, or pain. ? Fluid or blood. ? Warmth. ? Pus or a bad smell. Activity  For the first 2-3 days after your procedure, or as long as directed: ? Avoid climbing stairs as much as possible. ? Do not squat.  Do not lift anything that is heavier than 10 lb (4.5 kg), or the limit that you are told, until your health care provider says that it is safe.  Rest as  directed. ? Avoid sitting for a long time without moving. Get up to take short walks every 1-2 hours.  Do not drive for 24 hours if you were given a medicine to help you relax (sedative). General instructions  Take over-the-counter and prescription medicines only as told by your health care provider.  Keep all follow-up visits as told by your health care provider. This is important. Contact a health care provider if you have:  A fever or chills.  You have redness, swelling, or pain around your insertion site. Get help right away if:  The catheter insertion area swells very fast.  You pass out.  You suddenly start to sweat or your skin gets clammy.  The catheter insertion area is bleeding, and the bleeding does not stop when you hold steady pressure on the area.  The area near or just beyond the catheter insertion site becomes pale, cool, tingly, or numb. These symptoms may represent a serious problem that is an emergency. Do not wait to see if the symptoms will go away. Get medical help right away. Call your local emergency services (911 in the [place] Do not drive yourself to the hospital. Summary  After the procedure, it is common to have bruising that usually fades within 1-2 weeks.  Check your femoral site every day for signs of infection.  Do not lift anything that is heavier than 10 lb (4.5 kg), or   the limit that you are told, until your health care provider says that it is safe. This information is not intended to replace advice given to you by your health care provider. Make sure you discuss any questions you have with your health care provider. Document Revised: 03/10/2020 Document Reviewed: 03/10/2020 [employer] Patient Education  2021 [employer] Moderate Conscious Sedation, Adult, Care After This sheet gives you information about how to care for yourself after your procedure. Your health care provider may also give you more specific instructions. If you have problems  or questions, contact your health care provider. What can I expect after the procedure? After the procedure, it is common to have:  Sleepiness for several hours.  Impaired judgment for several hours.  Difficulty with balance.  Vomiting if you eat too soon. Follow these instructions at home: For the time period you were told by your health care provider:  Rest.  Do not participate in activities where you could fall or become injured.  Do not drive or use machinery.  Do not drink alcohol.  Do not take sleeping pills or medicines that cause drowsiness.  Do not make important decisions or sign legal documents.  Do not take care of children on your own.      Eating and drinking  Follow the diet recommended by your health care provider.  Drink enough fluid to keep your urine pale yellow.  If you vomit: ? Drink water, juice, or soup when you can drink without vomiting. ? Make sure you have little or no nausea before eating solid foods.   General instructions  Take over-the-counter and prescription medicines only as told by your health care provider.  Have a responsible adult stay with you for the time you are told. It is important to have someone help care for you until you are awake and alert.  Do not smoke.  Keep all follow-up visits as told by your health care provider. This is important. Contact a health care provider if:  You are still sleepy or having trouble with balance after 24 hours.  You feel light-headed.  You keep feeling nauseous or you keep vomiting.  You develop a rash.  You have a fever.  You have redness or swelling around the IV site. Get help right away if:  You have trouble breathing.  You have new-onset confusion at home. Summary  After the procedure, it is common to feel sleepy, have impaired judgment, or feel nauseous if you eat too soon.  Rest after you get home. Know the things you should not do after the procedure.  Follow the  diet recommended by your health care provider and drink enough fluid to keep your urine pale yellow.  Get help right away if you have trouble breathing or new-onset confusion at home. This information is not intended to replace advice given to you by your health care provider. Make sure you discuss any questions you have with your health care provider. Document Revised: 11/05/2019 Document Reviewed: 06/03/2019 [employer] Patient Education  2021 [employer]  

## 2020-08-17 ENCOUNTER — Other Ambulatory Visit: Payer: Self-pay

## 2020-08-17 ENCOUNTER — Encounter: Payer: Medicare PPO | Admitting: Physician Assistant

## 2020-08-17 DIAGNOSIS — L89893 Pressure ulcer of other site, stage 3: Secondary | ICD-10-CM | POA: Diagnosis not present

## 2020-08-17 NOTE — Progress Notes (Addendum)
KYSIR, CHIVERS ([id number]) Visit Report for 08/17/2020 Chief Complaint Document Details Patient Name: Ralph Peterson, Ralph Peterson. Date of Service: 08/17/2020 3:15 PM Medical Record Number: [id number] Patient Account Number: [id number] Date of Birth/Sex: 17-Mar-1966 (55 y.o. 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 M) Treating RN: Primary Care Provider: Vidal Schwalbe Other Clinician: Referring Provider: Vidal Schwalbe Treating Provider/Extender: Weeks in Treatment: 16 History of Present Illness HPI Description: 08/17/2020 upon evaluation today patient actually appears to be doing decently well today which is good news in regard to the dorsal wound which is almost healed and the lateral wound also doing much better. With regard to the plantar wound this is still draining purulent drainage unfortunately the culture did reveal that he had Pseudomonas as well as Enterococcus. With that being said he is allergic to penicillin which took away the only oral medication for the Enterococcus I did look at linezolid as a possibility but unfortunately there were medication interactions. Also the same was true the Cipro where there were medication interactions. Nonetheless I want to see if we can get  him into infectious disease to see if they can work with him to try to find possibly add at home IV antibiotic regimen that can help out at this point. No sharp debridement is can be necessary today. Electronic Signature(s) Signed: 08/17/2020 4:29:09 PM By: Worthy Keeler PA-C Entered By: Worthy Keeler on 08/17/2020 16:29:09 Ralph Peterson ([id number]) -------------------------------------------------------------------------------- Physical Exam Details Patient Name: Ralph Peterson Date of Service: 08/17/2020 3:15 PM Medical Record Number: [id number] Patient Account Number: [id number] Date of Birth/Sex: [birth year] (55 y.o. M) Treating RN: Cornell Barman Primary Care Provider: Vidal Schwalbe Other Clinician: Referring Provider: Vidal Schwalbe Treating Provider/Extender: Skipper Cliche in Treatment: 8 Constitutional Well-nourished and well-hydrated in no acute distress. Respiratory normal breathing without difficulty. Psychiatric this patient is able to make decisions and demonstrates good insight into disease process. Alert and Oriented x 3. pleasant and cooperative. Notes Upon inspection patient's lateral foot wound in the dorsal foot wound both appear to be doing well and this is great news. Overall very pleased in that regard. With regard to her plantar foot wound this still showed signs of purulent drainage and again with the positive culture I am still concerned that the patient is getting need some IV antibiotic therapy to try to get this under control. Electronic Signature(s) Signed: 08/17/2020 5:15:50 PM By: Worthy Keeler PA-C Entered By: Worthy Keeler on 08/17/2020 17:15:50 Ralph Peterson ([id number]) -------------------------------------------------------------------------------- Physician Orders Details Patient Name: Ralph Peterson Date of Service: 08/17/2020 3:15 PM Medical Record Number: [id number] Patient Account Number: [id number] Date  of Birth/Sex: Mar 30, 1966 (55 y.o. 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 Bathing/ Shower/ Hygiene o Clean wound with Normal Saline or wound cleanser. Off-Loading Wound #1 Right,Lateral Foot o Open toe surgical shoe with peg assist. Wound #2 Right,Dorsal Foot o Open toe surgical shoe with peg assist. Wound Treatment Wound #1 - Foot Wound Laterality: Right, Lateral Cleanser: Normal Saline (Generic) 3 x Per Week/30 Days Discharge Instructions: Wash your hands with soap and water. Remove old dressing, discard into plastic bag and place into trash. Cleanse the wound with Normal Saline prior to applying a clean dressing using gauze sponges, not tissues or cotton balls. Do not scrub or use excessive force. Pat dry using gauze sponges, not tissue or cotton balls. Primary Dressing: Prisma 4.34 (in) (Generic) 3 x Per Week/30 Days Discharge Instructions: Moisten w/normal saline or sterile water; Cover wound as directed. Do not remove from wound bed. Secondary Dressing: ABD Pad 5x9 (in/in) (Generic) 3 x Per Week/30 Days Discharge Instructions: Cover with ABD pad Secured With: Conforming Stretch  Gauze Bandage 4x75 (in/in) (Generic) 3 x Per Week/30 Days Discharge Instructions: Apply as directed Wound #2 - Foot Wound Laterality: Dorsal, Right Primary Dressing: Prisma 4.34 (in) (Generic) 3 x Per Week/30 Days Discharge Instructions: Moisten w/normal saline or sterile water; Cover wound as directed. Do not remove from wound bed. Secondary Dressing: ABD Pad 5x9 (in/in) (Generic) 3 x Per Week/30 Days Discharge Instructions: Cover with ABD pad Secured With: Conforming Stretch Gauze Bandage 4x75 (in/in) (Generic) 3 x Per Week/30 Days Discharge Instructions: Apply as directed Wound #5 - Foot Wound Laterality: Plantar, Right Dollar, Yoon S. ([id number]) Cleanser: Normal Saline (Generic) 3 x Per Week/30 Days Discharge Instructions: Wash your hands with soap and water. Remove old dressing, discard into plastic bag and place into trash. Cleanse the wound with Normal Saline prior to applying a clean dressing using gauze sponges, not tissues or cotton balls. Do not scrub or use excessive force. Pat dry using gauze sponges, not tissue or cotton balls. Primary Dressing: Iodoform 1/4x 5 (in/yd) (Generic) 3 x Per Week/30 Days Discharge Instructions: Apply Iodoform Packing Strip as instructed. Secondary Dressing: ABD Pad 5x9 (in/in) (Generic) 3 x Per Week/30 Days Discharge Instructions: Cover with ABD pad Secured With: Conforming Stretch Gauze Bandage 4x75 (in/in) (Generic) 3 x Per Week/30 Days Discharge Instructions: Apply as directed Electronic Signature(s) Signed: 08/17/2020 5:24:05 PM By: Worthy Keeler PA-C Signed: 08/17/2020 5:27:46 PM By: Georges Mouse, Minus Breeding RN Entered By: Georges Mouse, Kenia on 08/17/2020 16:24:30 Ralph Peterson ([id number]) -------------------------------------------------------------------------------- Problem List Details Patient Name: Ralph Peterson. Date of Service: 08/17/2020 3:15 PM Medical Record Number: [id number] Patient Account Number:  [id number] Date of Birth/Sex: 12-14-1965 (55 y.o. M) Treating RN: Cornell Barman Primary Care Provider: Vidal Schwalbe Other Clinician: Referring Provider: Vidal Schwalbe Treating Provider/Extender: Skipper Cliche in Treatment: 16 Active Problems ICD-10 Encounter Code Description Active Date MDM Diagnosis L89.893 Pressure ulcer of other site, stage 3 04/27/2020 No Yes T81.31XA Disruption of external operation (surgical) wound, not elsewhere 04/27/2020 No Yes classified, initial encounter L97.512 Non-pressure chronic ulcer of other part of right foot with fat layer 04/27/2020 No Yes exposed L97.812 Non-pressure chronic ulcer of other part  of right lower leg with fat layer 04/27/2020 No Yes exposed E11.621 Type 2 diabetes mellitus with foot ulcer 04/27/2020 No Yes I50.42 Chronic combined systolic (congestive) and diastolic (congestive) heart 04/27/2020 No Yes failure N18.6 End stage renal disease 04/27/2020 No Yes Z99.2 Dependence on renal dialysis 04/27/2020 No Yes Inactive Problems Resolved Problems Electronic Signature(s) Signed: 08/17/2020 3:26:32 PM By: Worthy Keeler PA-C Entered By: Worthy Keeler on 08/17/2020 15:26:29 Ralph Peterson ([id number]) -------------------------------------------------------------------------------- Progress Note Details Patient Name: Ralph Peterson. Date of Service: 08/17/2020 3:15 PM Medical Record Number: [id number] Patient Account Number: [id number] Date of Birth/Sex: Oct 26, 1965 (55 y.o. 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 With regard to the plantar wound this is still draining purulent drainage unfortunately the culture did reveal that he had Pseudomonas as well as Enterococcus. With that being said he is allergic to penicillin which took away the only oral medication for the Enterococcus I did look at linezolid as a possibility but unfortunately there were medication interactions. Also the same was true the Cipro where there were medication interactions. Nonetheless I want to see if we can get him into infectious disease to see if they can work with him to try to find possibly add at home IV antibiotic regimen that can help out at this point. No sharp debridement is can be necessary today. Objective Constitutional Well-nourished and well-hydrated in no acute distress. Vitals Time Taken: 3:38 PM, Height: 72 in, Weight: 225 lbs, BMI: 30.5, Temperature: 98.1 F, Pulse: 74 bpm, Respiratory Rate: 18 breaths/min, Blood Pressure: 123/82 mmHg. Respiratory normal breathing without difficulty. Psychiatric this patient is able to make decisions and demonstrates good insight into disease process. Alert and Oriented x 3. pleasant and cooperative. General Notes: Upon inspection patient's lateral foot wound in the dorsal foot wound both appear to be doing well and this is great news. Overall very pleased in that regard. With regard to her plantar foot wound this still showed signs of purulent drainage and again with the positive culture I am still concerned that the patient is getting need some IV antibiotic therapy to try to get this under control. Integumentary (Hair, Skin) Wound #1 status is Open. Original cause of wound was Pressure Injury. The wound is located on the Right,Lateral Foot. The wound measures 1.1cm length x 1.2cm width x 0.4cm depth; 1.037cm^2 area and 0.415cm^3 volume. There is joint, tendon, and  Fat Layer (Subcutaneous Tissue) exposed. There is no tunneling or undermining noted. There is a medium amount of serosanguineous drainage noted. The wound margin is distinct with the outline attached to the wound base. There is large (67-100%) red granulation within the wound bed. There is a small (1-33%) amount of necrotic tissue within the wound bed including Adherent Slough. Wound #2 status is Open. Original cause of wound was Surgical Injury. The wound is located on the Right,Dorsal Foot. The wound measures 0.5cm length x 0.3cm width x 0.1cm depth; 0.118cm^2 area and 0.012cm^3 volume. There is Fat Layer (Subcutaneous Tissue) exposed. There is no tunneling or undermining noted. There  is a large amount of serosanguineous drainage noted. The wound margin is distinct with the outline attached to the wound base. There is large (67-100%) red granulation within the wound bed. There is no necrotic tissue within the wound bed. Wound #5 status is Open. Original cause of wound was Surgical Injury. The wound is located on the [place]. The wound measures 1cm length x 0.4cm width x 3cm depth; 0.314cm^2 area and 0.942cm^3 volume. There is Fat Layer (Subcutaneous Tissue) exposed. There is no tunneling or undermining noted. There is a large amount of purulent drainage noted. The wound margin is distinct with the outline attached to the wound base. There is no granulation within the wound bed. There is a large (67-100%) amount of necrotic tissue within the wound bed including Adherent Slough. 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 Bathing/ Shower/ Hygiene: Clean wound with Normal Saline or wound cleanser. Off-Loading: Wound #1 Right,Lateral Foot: Open toe surgical shoe with peg assist. Wound #2 Right,Dorsal Foot: Open toe surgical shoe with peg assist. WOUND #1: - Foot Wound Laterality: Right, Lateral Cleanser: Normal Saline (Generic) 3 x Per Week/30 Days Discharge Instructions: Wash your hands with soap and water. Remove old dressing, discard into plastic bag and place into trash. Cleanse the wound with Normal Saline prior to applying a clean dressing using gauze sponges, not tissues or cotton balls. Do not scrub or use excessive force. Pat dry using gauze sponges, not tissue or cotton balls. Primary Dressing: Prisma 4.34 (in) (Generic) 3 x Per Week/30 Days Discharge Instructions: Moisten w/normal saline or sterile water; Cover wound as directed. Do not remove from wound bed. Secondary Dressing: ABD Pad 5x9 (in/in) (Generic) 3 x Per Week/30 Days Discharge Instructions: Cover with ABD pad Secured With: Conforming Stretch Gauze Bandage 4x75 (in/in) (Generic) 3 x Per Week/30 Days Discharge Instructions: Apply as directed WOUND #2: - Foot Wound Laterality: Dorsal, Right Primary Dressing: Prisma 4.34 (in) (Generic) 3 x Per Week/30 Days Discharge Instructions: Moisten w/normal saline or sterile water; Cover wound as directed. Do not remove from wound bed. Secondary Dressing: ABD Pad 5x9 (in/in) (Generic) 3 x Per Week/30 Days Discharge Instructions: Cover with ABD pad Secured With: Conforming Stretch Gauze Bandage 4x75 (in/in) (Generic) 3 x Per Week/30 Days Discharge Instructions: Apply as directed WOUND #5: - Foot Wound Laterality: Plantar, Right Cleanser: Normal Saline (Generic) 3 x Per Week/30 Days Discharge Instructions: Wash  your hands with soap and water. Remove old dressing, discard into plastic bag and place into trash. Cleanse the wound with Normal Saline prior to applying a clean dressing using gauze sponges, not tissues or cotton balls. Do not scrub or use excessive force. Pat dry using gauze sponges, not tissue or cotton balls. Primary Dressing: Iodoform 1/4x 5 (in/yd) (Generic) 3 x Per Week/30 Days Discharge Instructions: Apply Iodoform Packing Strip as instructed. Secondary Dressing: ABD Pad 5x9 (in/in) (Generic) 3 x Per Week/30 Days Discharge Instructions: Cover with ABD pad Secured With: Conforming Stretch Gauze Bandage 4x75 (in/in) (Generic) 3 x Per Week/30 Days Discharge Instructions: Apply  as directed 1. Would recommend currently that we do make a switch on the plantar foot to packing with iodoform packing gauze as I feel like this is can be better than the alginate for this region. 2. We will get a continue with the silver collagen to the other 2 wound locations on the lateral foot on the dorsal foot which seems to be doing excellent. 3. I am going to make a referral in fact already have made referral to infectious disease and we will see you hopefully they can get him in soon and get something started IV antibiotic wise or if they feel comfortable orally going with a different medication I am okay with that as well. 4. I am also can recommend that the patient continue to as much as possible keep a close eye monitor for any signs of worsening infection in the interim if anything occurs he should let me know but honestly go to the ER as soon as possible as he may need more urgent IV antibiotic therapy. We will see patient back for reevaluation in 1 week here in the clinic. If anything worsens or changes patient will contact our office for additional recommendations. ABENEZER, HUIZAR ([id number]) Electronic Signature(s) Signed: 08/17/2020 5:16:51 PM By: Worthy Keeler PA-C Entered By: Worthy Keeler on 08/17/2020 17:16:51 Ralph Peterson ([id number]) -------------------------------------------------------------------------------- SuperBill Details Patient Name: Ralph Peterson Date of Service: 08/17/2020 Medical Record Number: [id number] Patient Account Number: [id number] Date of Birth/Sex: 20-Aug-1965 (55 y.o. 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 End stage renal disease Z99.2 Dependence on renal dialysis Facility Procedures CPT4 Code: [id number] Description: 99213 - WOUND CARE VISIT-LEV 3 EST PT Modifier: Quantity: 1 Physician Procedures CPT4 Code: [id number] Description: 99214 - WC PHYS LEVEL 4 - EST PT Modifier: Quantity: 1 CPT4 Code: Description: ICD-10 Diagnosis Description L89.893 Pressure ulcer of other site, stage 3 T81.31XA Disruption of external operation (surgical) wound, not elsewhere classifi L97.512 Non-pressure chronic ulcer of other part of right foot with fat layer exp  L97.812 Non-pressure chronic ulcer of other part of right lower leg with fat laye Modifier: ed, initial encounter osed r exposed Quantity: Electronic Signature(s) Signed: 08/17/2020 5:17:07 PM By: Worthy Keeler PA-C Entered By: Worthy Keeler on 08/17/2020 17:17:06

## 2020-08-17 NOTE — Progress Notes (Addendum)
Ralph Peterson, [name] ([id number]) Visit Report for 08/17/2020 Arrival Information Details Patient Name: Ralph Peterson, Ralph Peterson. Date of Service: 08/17/2020 3:15 PM Medical Record Number: [id number] Patient Account Number: [id number] Date of Birth/Sex: [birth year] (55 y.o. M) Treating RN: Dolan Amen Primary Care Zuriel Yeaman: Vidal Schwalbe Other Clinician: Referring Eknoor Novack: Vidal Schwalbe Treating Wanell Lorenzi/Extender: Skipper Cliche in Treatment: 16 Visit Information History Since Last Visit Had a fall or experienced change in No Patient Arrived: Gilford Rile activities of daily living that may affect Arrival Time: 15:36 risk of falls: Accompanied By: son Hospitalized since last visit: No Transfer Assistance: None Has Dressing in Place as Prescribed: Yes Patient Identification Verified: Yes Has Footwear/Offloading in Place as Prescribed: Yes Secondary Verification Process Completed: Yes Pain Present Now: No Patient Requires Transmission-Based Precautions: No Patient Has Alerts: No Electronic Signature(s) Signed: 08/17/2020 5:27:46 PM By: Georges Mouse, Minus Breeding RN Entered By: Georges Mouse, Minus Breeding on 08/17/2020 15:38:29 Ralph Peterson ([id number]) -------------------------------------------------------------------------------- Clinic Level of Care Assessment Details Patient Name: Ralph Peterson Date of Service: 08/17/2020 3:15 PM Medical Record Number: [id number] Patient Account Number: [id number] Date of Birth/Sex: 12-Mar-1966 (55 y.o. M) Treating RN: Dolan Amen Primary Care Bryttany Tortorelli: Vidal Schwalbe Other Clinician: Referring Grethel Zenk: Vidal Schwalbe Treating Jaleena Viviani/Extender: Skipper Cliche in Treatment: 16 Clinic Level of Care Assessment Items TOOL 4 Quantity Score X - Use when only an EandM is performed on FOLLOW-UP visit 1 0 ASSESSMENTS - Nursing Assessment / Reassessment X - Reassessment of Co-morbidities (includes updates in patient status) 1 10 X- 1  5 Reassessment of Adherence to Treatment Plan ASSESSMENTS - Wound and Skin Assessment / Reassessment X - Simple Wound Assessment / Reassessment - one wound 1 5 '[]'$  - 0 Complex Wound Assessment / Reassessment - multiple wounds '[]'$  - 0 Dermatologic / Skin Assessment (not related to wound area) ASSESSMENTS - Focused Assessment '[]'$  - Circumferential Edema Measurements - multi extremities 0 '[]'$  - 0 Nutritional Assessment / Counseling / Intervention '[]'$  - 0 Lower Extremity Assessment (monofilament, tuning fork, pulses) '[]'$  - 0 Peripheral Arterial Disease Assessment (using hand held doppler) ASSESSMENTS - Ostomy and/or Continence Assessment and Care '[]'$  - Incontinence Assessment and Management 0 '[]'$  - 0 Ostomy Care Assessment and Management (repouching, etc.) PROCESS - Coordination of Care X - Simple Patient / Family Education for ongoing care 1 15 '[]'$  - 0 Complex (extensive) Patient / Family Education for ongoing care '[]'$  - 0 Staff obtains Programmer, systems, Records, Test Results / Process Orders '[]'$  - 0 Staff telephones HHA, Nursing Homes / Clarify orders / etc '[]'$  - 0 Routine Transfer to another Facility (non-emergent condition) '[]'$  - 0 Routine Hospital Admission (non-emergent condition) '[]'$  - 0 New Admissions / Biomedical engineer / Ordering NPWT, Apligraf, etc. '[]'$  - 0 Emergency Hospital Admission (emergent condition) X- 1 10 Simple Discharge Coordination '[]'$  - 0 Complex (extensive) Discharge Coordination PROCESS - Special Needs '[]'$  - Pediatric / Minor Patient Management 0 '[]'$  - 0 Isolation Patient Management '[]'$  - 0 Hearing / Language / Visual special needs '[]'$  - 0 Assessment of Community assistance (transportation, D/C planning, etc.) '[]'$  - 0 Additional assistance / Altered mentation '[]'$  - 0 Support Surface(s) Assessment (bed, cushion, seat, etc.) INTERVENTIONS - Wound Cleansing / Measurement Demeter, Deryk S. ([id number]) '[]'$  - 0 Simple Wound Cleansing - one wound X- 3 5 Complex  Wound Cleansing - multiple wounds X- 1 5 Wound Imaging (photographs - any number of wounds) '[]'$  - 0 Wound Tracing (instead of photographs) '[]'$  - 0 Simple Wound Measurement - one wound X-  1 5 Complex Wound Measurement - multiple wounds INTERVENTIONS - Wound Dressings '[]'$  - Small Wound Dressing one or multiple wounds 0 X- 1 15 Medium Wound Dressing one or multiple wounds '[]'$  - 0 Large Wound Dressing one or multiple wounds '[]'$  - 0 Application of Medications - topical '[]'$  - 0 Application of Medications - injection INTERVENTIONS - Miscellaneous '[]'$  - External ear exam 0 '[]'$  - 0 Specimen Collection (cultures, biopsies, blood, body fluids, etc.) '[]'$  - 0 Specimen(s) / Culture(s) sent or taken to Lab for analysis '[]'$  - 0 Patient Transfer (multiple staff / Civil Service fast streamer / Similar devices) '[]'$  - 0 Simple Staple / Suture removal (25 or less) '[]'$  - 0 Complex Staple / Suture removal (26 or more) '[]'$  - 0 Hypo / Hyperglycemic Management (close monitor of Blood Glucose) '[]'$  - 0 Ankle / Brachial Index (ABI) - do not check if billed separately X- 1 5 Vital Signs Has the patient been seen at the hospital within the last three years: Yes Total Score: 90 Level Of Care: New/Established - Level 3 Electronic Signature(s) Signed: 08/17/2020 5:27:46 PM By: Georges Mouse, Minus Breeding RN Entered By: Georges Mouse, Kenia on 08/17/2020 16:17:02 Ralph Peterson ([id number]) -------------------------------------------------------------------------------- Encounter Discharge Information Details Patient Name: Ralph Peterson Date of Service: 08/17/2020 3:15 PM Medical Record Number: [id number] Patient Account Number: [id number] Date of Birth/Sex: 11/09/1965 (55 y.o. M) Treating RN: Dolan Amen Primary Care Baine Decesare: Vidal Schwalbe Other Clinician: Referring Baylyn Sickles: Vidal Schwalbe Treating Leean Amezcua/Extender: Skipper Cliche in Treatment: 16 Encounter Discharge Information Items Discharge Condition:  Stable Ambulatory Status: Wheelchair Discharge Destination: Home Transportation: Private Auto Accompanied By: son Schedule Follow-up Appointment: Yes Clinical Summary of Care: Electronic Signature(s) Signed: 08/17/2020 5:27:46 PM By: Georges Mouse, Minus Breeding RN Entered By: Georges Mouse, Minus Breeding on 08/17/2020 16:18:05 Ralph Peterson ([id number]) -------------------------------------------------------------------------------- Lower Extremity Assessment Details Patient Name: Ralph Peterson Date of Service: 08/17/2020 3:15 PM Medical Record Number: [id number] Patient Account Number: [id number] Date of Birth/Sex: 1965/10/05 (55 y.o. M) Treating RN: Dolan Amen Primary Care Zadyn Yardley: Vidal Schwalbe Other Clinician: Referring Eudelia Hiltunen: Vidal Schwalbe Treating Mahrukh Seguin/Extender: Jeri Cos Weeks in Treatment: 16 Edema Assessment Assessed: [Left: No] Patrice Paradise: Yes] [Left: Edema] [Right: :] Vascular Assessment Pulses: Dorsalis Pedis Palpable: [Right:Yes] Electronic Signature(s) Signed: 08/17/2020 5:27:46 PM By: Georges Mouse, Minus Breeding RN Entered By: Georges Mouse, Minus Breeding on 08/17/2020 16:04:20 Ralph Peterson ([id number]) -------------------------------------------------------------------------------- Multi Wound Chart Details Patient Name: Ralph Peterson. Date of Service: 08/17/2020 3:15 PM Medical Record Number: [id number] Patient Account Number: [id number] Date of Birth/Sex: [birth year] (55 y.o. 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 Comorbid History: Arrhythmia, Congestive Heart Arrhythmia, Congestive Heart Arrhythmia, Congestive Heart Failure, Coronary Artery Disease, Failure, Coronary Artery Disease, Failure, Coronary Artery Disease, Hypertension, Myocardial Infarction, Hypertension, Myocardial Infarction, Hypertension, Myocardial Infarction, Peripheral Venous Disease, Type II Peripheral Venous Disease, Type II Peripheral Venous Disease, Type II Diabetes, Neuropathy Diabetes, Neuropathy Diabetes, Neuropathy Date Acquired: 02/20/2020 04/20/2020 03/24/2020 Weeks of Treatment: '16 16 13 '$ Wound Status: Open Open Open Measurements L x W x D (cm) 1.1x1.2x0.4 0.5x0.3x0.1 1x0.4x3 Area (cm) : 1.037 0.118 0.314 Volume (cm) :  0.415 0.012 0.942 % Reduction in Area: 79.50% 97.50% 53.00% % Reduction in Volume: 83.60% 99.20% -182.00% Classification: Category/Stage IV Full Thickness Without Exposed Grade 2 Support Structures Exudate Amount: Medium Large Large Exudate Type: Serosanguineous Serosanguineous Serosanguineous Exudate Color: red, brown red, brown red, brown Wound Margin: Distinct, outline attached Distinct, outline attached Distinct, outline attached Granulation Amount: Large (67-100%) Large (67-100%) None Present (0%) Granulation Quality: Red Red N/A Necrotic Amount: Small (1-33%) None Present (0%) Large (67-100%) Exposed Structures: Fat Layer (Subcutaneous Tissue): Fat Layer (Subcutaneous Tissue): Fat Layer (Subcutaneous Tissue): Yes Yes Yes Tendon: Yes Fascia: No Fascia: No Joint: Yes Tendon: No Tendon: No Fascia: No Muscle: No Muscle: No Muscle: No Joint: No Joint: No Bone: No Bone: No Bone: No Epithelialization: None Large (67-100%) None Treatment Notes Electronic Signature(s) Signed: 08/17/2020 5:27:46 PM By: Georges Mouse, Minus Breeding RN Entered By: Georges Mouse, Kenia  on 08/17/2020 16:12:36 Ralph Peterson ([name] ([id number]) -------------------------------------------------------------------------------- Multi-Disciplinary Care Plan Details Patient Name: Ralph Peterson Date of Service: 08/17/2020 3:15 PM Medical Record Number: [id number] Patient Account Number: [id number] Date of Birth/Sex: 1966/02/19 (56 y.o. M) Treating RN: Dolan Amen Primary Care Hamad Whyte: Vidal Schwalbe Other Clinician: Referring Fitzroy Mikami: Vidal Schwalbe Treating Daune Colgate/Extender: Skipper Cliche in Treatment: 16 Active Inactive Necrotic Tissue Nursing Diagnoses: Impaired tissue integrity related to necrotic/devitalized tissue Knowledge deficit related to management of necrotic/devitalized tissue Goals: Necrotic/devitalized tissue will be minimized in the wound bed Date Initiated: 05/09/2020 Target Resolution Date: 05/09/2020 Goal Status: Active Interventions: Assess patient pain level pre-, during and post procedure and prior to discharge Provide education on necrotic tissue and debridement process Treatment Activities: Enzymatic debridement : 05/09/2020 Notes: Wound/Skin Impairment Nursing Diagnoses: Impaired tissue integrity Knowledge deficit related to ulceration/compromised skin integrity Goals: Patient/caregiver will verbalize understanding of skin care regimen Date Initiated: 04/27/2020 Target Resolution Date: 05/25/2020 Goal Status: Active Interventions: Assess patient/caregiver ability to obtain necessary supplies Assess patient/caregiver ability to perform ulcer/skin care regimen upon admission and as needed Assess ulceration(s) every visit Provide education on ulcer and skin care Treatment Activities: Skin care regimen initiated : 04/27/2020 Topical wound management initiated : 04/27/2020 Notes: Electronic Signature(s) Signed: 08/17/2020 5:27:46 PM By: Georges Mouse, Minus Breeding RN Entered By: Georges Mouse,  Minus Breeding on 08/17/2020 16:12:13 Ralph Peterson ([id number]) -------------------------------------------------------------------------------- Pain Assessment Details Patient Name: Ralph Peterson Date of Service: 08/17/2020 3:15 PM Medical Record Number: [id number] Patient Account Number: [id number] Date of Birth/Sex: Jul 29, 1965 (55 y.o. M) Treating RN: Dolan Amen Primary Care Makennah Omura: Vidal Schwalbe Other Clinician: Referring Annete Ayuso: Vidal Schwalbe Treating Latrice Storlie/Extender: Skipper Cliche in Treatment: 16 Active Problems Location of Pain Severity and Description of Pain Patient Has Paino No Site Locations Rate the pain. Current Pain Level: 0 Pain Management and Medication Current Pain Management: Electronic Signature(s) Signed: 08/17/2020 5:27:46 PM By: Georges Mouse, Minus Breeding RN Entered By: Georges Mouse, Kenia on 08/17/2020 15:41:14 Ralph Peterson ([id number]) -------------------------------------------------------------------------------- Patient/Caregiver Education Details Patient Name: Ralph Peterson Date of Service: 08/17/2020 3:15 PM Medical Record Number: [id number] Patient Account Number: [id number] Date of Birth/Gender: 1966-04-13 (55 y.o. M) Treating RN: Dolan Amen Primary Care Physician: Vidal Schwalbe Other Clinician: Referring Physician: Vidal Schwalbe Treating Physician/Extender: Skipper Cliche in Treatment: 16 Education Assessment Education Provided To: Patient Education Topics Provided Wound/Skin Impairment: Methods: Explain/Verbal Responses: State content correctly Electronic Signature(s) Signed: 08/17/2020 5:27:46 PM By: Georges Mouse, Minus Breeding RN Entered By: Georges Mouse, Minus Breeding on 08/17/2020 16:17:25 Ralph Peterson ([id number]) -------------------------------------------------------------------------------- Wound Assessment Details Patient Name: Ralph Peterson Date of  Service: 08/17/2020 3:15  PM Medical Record Number: [id number] Patient Account Number: [id number] Date of Birth/Sex: 1965/08/23 (55 y.o. M) Treating RN: Dolan Amen Primary Care Markeis Allman: Vidal Schwalbe Other Clinician: Referring Bianna Haran: Vidal Schwalbe Treating Mckayla Mulcahey/Extender: Skipper Cliche in Treatment: 16 Wound Status Wound Number: 1 Primary Pressure Ulcer Etiology: Wound Location: Right, Lateral Foot Wound Open Wounding Event: Pressure Injury Status: Date Acquired: 02/20/2020 Comorbid Arrhythmia, Congestive Heart Failure, Coronary Artery Weeks Of Treatment: 16 History: Disease, Hypertension, Myocardial Infarction, Peripheral Clustered Wound: No Venous Disease, Type II Diabetes, Neuropathy Photos Wound Measurements Length: (cm) 1.1 Width: (cm) 1.2 Depth: (cm) 0.4 Area: (cm) 1.037 Volume: (cm) 0.415 % Reduction in Area: 79.5% % Reduction in Volume: 83.6% Epithelialization: None Tunneling: No Undermining: No Wound Description Classification: Category/Stage IV Wound Margin: Distinct, outline attached Exudate Amount: Medium Exudate Type: Serosanguineous Exudate Color: red, brown Foul Odor After Cleansing: No Slough/Fibrino Yes Wound Bed Granulation Amount: Large (67-100%) Exposed Structure Granulation Quality: Red Fascia Exposed: No Necrotic Amount: Small (1-33%) Fat Layer (Subcutaneous Tissue) Exposed: Yes Necrotic Quality: Adherent Slough Tendon Exposed: Yes Muscle Exposed: No Joint Exposed: Yes Bone Exposed: No Treatment Notes Wound #1 (Foot) Wound Laterality: Right, Lateral Cleanser Normal Saline Discharge Instruction: Wash your hands with soap and water. Remove old dressing, discard into plastic bag and place into trash. Cleanse the wound with Normal Saline prior to applying a clean dressing using gauze sponges, not tissues or cotton balls. Do not Egnor, Jaion S. ([id number]) scrub or use excessive force. Pat dry using gauze sponges, not tissue or cotton  balls. Peri-Wound Care Topical Primary Dressing Prisma 4.34 (in) Discharge Instruction: Moisten w/normal saline or sterile water; Cover wound as directed. Do not remove from wound bed. Secondary Dressing ABD Pad 5x9 (in/in) Discharge Instruction: Cover with ABD pad Secured With Conforming Stretch Gauze Bandage 4x75 (in/in) Discharge Instruction: Apply as directed Compression Wrap Compression Stockings Add-Ons Electronic Signature(s) Signed: 08/17/2020 5:27:46 PM By: Georges Mouse, Minus Breeding RN Entered By: Georges Mouse, Kenia on 08/17/2020 16:03:25 Ralph Peterson ([id number]) -------------------------------------------------------------------------------- Wound Assessment Details Patient Name: Ralph Peterson. Date of Service: 08/17/2020 3:15 PM Medical Record Number: [id number] Patient Account Number: [id number] Date of Birth/Sex: [birth year] (55 y.o. M) Treating RN: Dolan Amen Primary Care Ingram Onnen: Vidal Schwalbe Other Clinician: Referring Raynesha Tiedt: Vidal Schwalbe Treating Shahir Karen/Extender: Skipper Cliche in Treatment: 16 Wound Status Wound Number: 2 Primary Open Surgical Wound Etiology: Wound Location: Right, Dorsal Foot Wound Open Wounding Event: Surgical Injury Status: Date Acquired: 04/20/2020 Comorbid Arrhythmia, Congestive Heart Failure, Coronary Artery Weeks Of Treatment: 16 History: Disease, Hypertension, Myocardial Infarction, Peripheral Clustered Wound: No Venous Disease, Type II Diabetes, Neuropathy Photos Wound Measurements Length: (cm) 0.5 Width: (cm) 0.3 Depth: (cm) 0.1 Area: (cm) 0.118 Volume: (cm) 0.012 % Reduction in Area: 97.5% % Reduction in Volume: 99.2% Epithelialization: Large (67-100%) Tunneling: No Undermining: No Wound Description Classification: Full Thickness Without Exposed Support Structu Wound Margin: Distinct, outline attached Exudate Amount: Large Exudate Type: Serosanguineous Exudate Color: red,  brown res Foul Odor After Cleansing: No Slough/Fibrino Yes Wound Bed Granulation Amount: Large (67-100%) Exposed Structure Granulation Quality: Red Fascia Exposed: No Necrotic Amount: None Present (0%) Fat Layer (Subcutaneous Tissue) Exposed: Yes Tendon Exposed: No Muscle Exposed: No Joint Exposed: No Bone Exposed: No Treatment Notes Wound #2 (Foot) Wound Laterality: Dorsal, Right Cleanser Peri-Wound Care Topical Capley, Jonn S. ([id number]) Primary Dressing Prisma 4.34 (in) Discharge Instruction: Moisten w/normal saline or sterile water; Cover wound as directed. Do not remove from wound bed. Secondary Dressing ABD Pad  5x9 (in/in) Discharge Instruction: Cover with ABD pad Secured With Conforming Stretch Gauze Bandage 4x75 (in/in) Discharge Instruction: Apply as directed Compression Wrap Compression Stockings Add-Ons Electronic Signature(s) Signed: 08/17/2020 5:27:46 PM By: Georges Mouse, Minus Breeding RN Entered By: Georges Mouse, Kenia on 08/17/2020 16:02:23 Ralph Peterson ([id number]) -------------------------------------------------------------------------------- Wound Assessment Details Patient Name: Ralph Peterson. Date of Service: 08/17/2020 3:15 PM Medical Record Number: [id number] Patient Account Number: [id number] Date of Birth/Sex: May 22, 1966 (55 y.o. M) Treating RN: Dolan Amen Primary Care Meliton Samad: Vidal Schwalbe Other Clinician: Referring Sommer Spickard: Vidal Schwalbe Treating Enjoli Tidd/Extender: Skipper Cliche in Treatment: 16 Wound Status Wound Number: 5 Primary Diabetic Wound/Ulcer of the Lower Extremity Etiology: Wound Location: Right, Plantar Foot Wound Open Wounding Event: Surgical Injury Status: Date Acquired: 03/24/2020 Comorbid Arrhythmia, Congestive Heart Failure, Coronary Artery Weeks Of Treatment: 13 History: Disease, Hypertension, Myocardial Infarction, Peripheral Clustered Wound: No Venous Disease, Type II Diabetes,  Neuropathy Photos Wound Measurements Length: (cm) 1 Width: (cm) 0.4 Depth: (cm) 3 Area: (cm) 0.314 Volume: (cm) 0.942 % Reduction in Area: 53% % Reduction in Volume: -182% Epithelialization: None Tunneling: No Undermining: No Wound Description Classification: Grade 2 Wound Margin: Distinct, outline attached Exudate Amount: Large Exudate Type: Purulent Exudate Color: yellow, brown, green Foul Odor After Cleansing: No Slough/Fibrino Yes Wound Bed Granulation Amount: None Present (0%) Exposed Structure Necrotic Amount: Large (67-100%) Fascia Exposed: No Necrotic Quality: Adherent Slough Fat Layer (Subcutaneous Tissue) Exposed: Yes Tendon Exposed: No Muscle Exposed: No Joint Exposed: No Bone Exposed: No Treatment Notes Wound #5 (Foot) Wound Laterality: Plantar, Right Cleanser Normal Saline Discharge Instruction: Wash your hands with soap and water. Remove old dressing, discard into plastic bag and place into trash. Cleanse the wound with Normal Saline prior to applying a clean dressing using gauze sponges, not tissues or cotton balls. Do not Castoro, Landy S. ([id number]) scrub or use excessive force. Pat dry using gauze sponges, not tissue or cotton balls. Peri-Wound Care Topical Primary Dressing Silvercel Small 2x2 (in/in) Discharge Instruction: Apply Silvercel Small 2x2 (in/in) as instructed Secondary Dressing ABD Pad 5x9 (in/in) Discharge Instruction: Cover with ABD pad Secured With Conforming Stretch Gauze Bandage 4x75 (in/in) Discharge Instruction: Apply as directed Compression Wrap Compression Stockings Add-Ons Electronic Signature(s) Signed: 08/17/2020 5:27:46 PM By: Georges Mouse, Minus Breeding RN Entered By: Georges Mouse, Kenia on 08/17/2020 16:14:44 Ralph Peterson ([id number]) -------------------------------------------------------------------------------- [place] Details Patient Name: Ralph Peterson Date of Service: 08/17/2020 3:15  PM Medical Record Number: [id number] Patient Account Number: [id number] Date of Birth/Sex: Dec 14, 1965 (55 y.o. M) Treating RN: Dolan Amen Primary Care Tameria Patti: Vidal Schwalbe Other Clinician: Referring Truda Staub: Vidal Schwalbe Treating Jarrin Staley/Extender: Skipper Cliche in Treatment: 16 Vital Signs Time Taken: 15:38 Temperature (F): 98.1 Height (in): 72 Pulse (bpm): 74 Weight (lbs): 225 Respiratory Rate (breaths/min): 18 Body Mass Index (BMI): 30.5 Blood Pressure (mmHg): 123/82 Reference Range: 80 - 120 mg / dl Electronic Signature(s) Signed: 08/17/2020 5:27:46 PM By: Georges Mouse, Minus Breeding RN Entered By: Georges Mouse, Minus Breeding on 08/17/2020 15:41:08

## 2020-08-22 ENCOUNTER — Encounter: Payer: Self-pay | Admitting: Infectious Diseases

## 2020-08-22 ENCOUNTER — Ambulatory Visit (INDEPENDENT_AMBULATORY_CARE_PROVIDER_SITE_OTHER): Payer: Medicare PPO | Admitting: Infectious Diseases

## 2020-08-22 ENCOUNTER — Emergency Department (HOSPITAL_COMMUNITY)
Admission: EM | Admit: 2020-08-22 | Discharge: 2020-08-23 | Disposition: A | Discharge status: Home / Self Care | Payer: Medicare PPO | Source: Home / Self Care

## 2020-08-22 ENCOUNTER — Ambulatory Visit
Admission: RE | Admit: 2020-08-22 | Discharge: 2020-08-22 | Disposition: A | Discharge status: Home / Self Care | Payer: Medicare PPO | Source: Ambulatory Visit | Attending: Physician Assistant | Admitting: Physician Assistant

## 2020-08-22 ENCOUNTER — Emergency Department (HOSPITAL_COMMUNITY): Payer: Medicare PPO

## 2020-08-22 ENCOUNTER — Encounter (HOSPITAL_COMMUNITY): Payer: Self-pay | Admitting: Emergency Medicine

## 2020-08-22 ENCOUNTER — Other Ambulatory Visit: Payer: Self-pay

## 2020-08-22 VITALS — BP 104/70 | HR 75 | Temp 98.3°F

## 2020-08-22 DIAGNOSIS — S91301A Unspecified open wound, right foot, initial encounter: Secondary | ICD-10-CM | POA: Insufficient documentation

## 2020-08-22 DIAGNOSIS — X58XXXA Exposure to other specified factors, initial encounter: Secondary | ICD-10-CM | POA: Insufficient documentation

## 2020-08-22 DIAGNOSIS — L02611 Cutaneous abscess of right foot: Secondary | ICD-10-CM

## 2020-08-22 DIAGNOSIS — L97512 Non-pressure chronic ulcer of other part of right foot with fat layer exposed: Secondary | ICD-10-CM | POA: Insufficient documentation

## 2020-08-22 DIAGNOSIS — Z5321 Procedure and treatment not carried out due to patient leaving prior to being seen by health care provider: Secondary | ICD-10-CM | POA: Insufficient documentation

## 2020-08-22 DIAGNOSIS — M869 Osteomyelitis, unspecified: Secondary | ICD-10-CM | POA: Insufficient documentation

## 2020-08-22 LAB — COMPREHENSIVE METABOLIC PANEL
ALT: 13 U/L (ref 0–44)
AST: 18 U/L (ref 15–41)
Albumin: 2.9 g/dL — ABNORMAL LOW (ref 3.5–5.0)
Alkaline Phosphatase: 107 U/L (ref 38–126)
Anion gap: 13 (ref 5–15)
BUN: 36 mg/dL — ABNORMAL HIGH (ref 6–20)
CO2: 27 mmol/L (ref 22–32)
Calcium: 9.4 mg/dL (ref 8.9–10.3)
Chloride: 93 mmol/L — ABNORMAL LOW (ref 98–111)
Creatinine, Ser: 5.44 mg/dL — ABNORMAL HIGH (ref 0.61–1.24)
GFR, Estimated: 12 mL/min — ABNORMAL LOW (ref >60)
Glucose, Bld: 94 mg/dL (ref 70–99)
Potassium: 5 mmol/L (ref 3.5–5.1)
Sodium: 133 mmol/L — ABNORMAL LOW (ref 135–145)
Total Bilirubin: 1 mg/dL (ref 0.3–1.2)
Total Protein: 6.5 g/dL (ref 6.5–8.1)

## 2020-08-22 LAB — CBC WITH DIFFERENTIAL/PLATELET
Abs Immature Granulocytes: 0.03 10*3/uL (ref 0.00–0.07)
Basophils Absolute: 0.1 10*3/uL (ref 0.0–0.1)
Basophils Relative: 1 %
Eosinophils Absolute: 0.2 10*3/uL (ref 0.0–0.5)
Eosinophils Relative: 4 %
HCT: 38 % — ABNORMAL LOW (ref 39.0–52.0)
Hemoglobin: 11.9 g/dL — ABNORMAL LOW (ref 13.0–17.0)
Immature Granulocytes: 1 %
Lymphocytes Relative: 25 %
Lymphs Abs: 1.5 10*3/uL (ref 0.7–4.0)
MCH: 30.2 pg (ref 26.0–34.0)
MCHC: 31.3 g/dL (ref 30.0–36.0)
MCV: 96.4 fL (ref 80.0–100.0)
Monocytes Absolute: 0.6 10*3/uL (ref 0.1–1.0)
Monocytes Relative: 9 %
Neutro Abs: 3.7 10*3/uL (ref 1.7–7.7)
Neutrophils Relative %: 60 %
Platelets: 135 10*3/uL — ABNORMAL LOW (ref 150–400)
RBC: 3.94 MIL/uL — ABNORMAL LOW (ref 4.22–5.81)
RDW: 19.4 % — ABNORMAL HIGH (ref 11.5–15.5)
WBC: 6 10*3/uL (ref 4.0–10.5)
nRBC: 0 % (ref 0.0–0.2)

## 2020-08-22 LAB — LACTIC ACID, PLASMA: Lactic Acid, Venous: 1.2 mmol/L (ref 0.5–1.9)

## 2020-08-22 MED ORDER — GADOBUTROL 1 MMOL/ML IV SOLN
10.0000 mL | Freq: Once | INTRAVENOUS | Status: AC | PRN
  Administered 2020-08-22: 10 mL via INTRAVENOUS

## 2020-08-22 NOTE — ED Triage Notes (Signed)
Pt arrives to ED with complaints of open wound on plantar surface of right foot. Pt was sent to ED from infectious disease to rule out osteomyelitis or need for I&D.

## 2020-08-22 NOTE — Progress Notes (Addendum)
[facility]                                                             Ralph Peterson, [place], [place], [address]                                                                  Phn. [phone]; Fax: [phone]                                                                             Date: 08/22/20  Reason for Referral: Wound at the RT Foot/Concern for Osteomyelitis   Assessment Ralph Peterson is a 55 year old Male with a PMH of Anxiety, Atrial Fibrillation, CAD s/p PCI, CHF, DM, ESRD. HTN, HLD,  Severe PAD with multiple intervention, h/o Left toe amputation ? TMA and Rt 2nd and 3rd toes amputation and Ex smoker who is referred from wound care for evaluation of wound at the plantar surface of his RT foot. MRI Rt foot with RT Foot plantar abscess at the second metatarsal amputation site/Possible Osteomyelitis  Plan I spoke with the on call Podiatrist Dr Boneta Lucks who recommended to send the patient to ED and he will be seeing the patient for possible surgical intervention once he is admitted.   RN spoke with the ES triage informing about the patient. RN escorted patient to ED along with his son  Consult Podiatry once patient is admitted Consider ID consult for possible need of long term IV antibiotics.  Follow up with Vascular as recommended   All questions and concerns were discussed and addressed. Patient verbalized understanding of the plan. ____________________________________________________________________________________________________________________  HPI: Ralph Peterson is a 55 year old Male with a PMH of Anxiety, Atrial Fibrillation, CAD s/p PCI, CHF, DM, ESRD. HTN, HLD,  Severe PAD with multiple intervention, h/o Left toe amputation ? TMA and Rt 2nd and 3rd toes amputation and Ex smoker who is referred from wound care for evaluation of wound at the plantar surface of his  RT foot.   He says he has had a wound at the base of the rt second toe which he says has started sometime since June last year which continued to increase in size and never healed. It started as a blister and has also started draining recently. He says he has been following up with wound care who has prescribed him a course of Doxycyline and bactrim. He has already completed 2 weeks course of doxy and is currently on 2 weeks course of bactrim. Denies taking other antibiotics prior to that. He says he also had wound developed around the same time in the dorsal and lateral rt foot but they have healed. He denies having any issues with his Left Foot after  TMA and has a wound in the anterior left lower leg which seems to be dry and non infected.  Denies feves, chills and sweats Denies N/V/D/rashes and allergy/itching Denies chest pain/SOB and cough  Denies GU symptoms  Smoked for 25 years and quit in 2010 Alcohol quit on 2015  Denies drugs   He had an MRI rt foot done his morning with findings concerning for abscess and Possible Osteomyelitis of the rt second metatarsal amputation site.   Vascular Intervention of lower extremities  S/p Crosser atherectomy of the right SFA and popliteal arteries,  Percutaneous transluminal angioplasty with Lutonix right superficial femoral artery and popliteal, Percutaneous transluminal angioplasty with Lutonix right peroneal artery and  Star close closure left common femoral arteriotomy in 03/26/2016  s/p Percutaneous transluminal angioplasty left superficial femoral and popliteal arteries,  Percutaneous transluminal angioplasty left posterior tibial artery and Percutaneous transluminal angioplasty left anterior tibial artery and Star close closure right common femoral arteriotomy in 06/23/2018   s/p PC transluminal angioplasty and stent placement RT superficial femoral artery and popoliteal and mechanical thrombectomy of rt SFA and star closure of Left common femoral  artery in 02/01/2020  ROS: 12 point ROS done with pertinent positives and negatives listed above   Past Medical History:  Diagnosis Date  . Anxiety   . Arrhythmia    atrial fibrillation  . CAD (coronary artery disease)    a. 09/2010 Cath/PCI ([facility]): LM nl, LAD 66m D1 80 (small), LCX 344mOM1 30, RI 70 (small), RCA 70 (DES).  . CH[facility] (congestive heart failure) ([facility]  . Coronary artery disease   . COVID-19 virus infection 07/2019  . Diabetes mellitus without complication ([facility]  . ESRD (end stage renal disease) ([facility]  . Hyperlipidemia   . Hypertension   . Ischemic cardiomyopathy    a.  12/2011 Echo ([facility]): NL EF, mod LVH. Mild AS/MS, triv PR/TR. 07/2019 Echo: EF 25-30%, GR1 DD, inf/post HK, low nl RV fxn, mildly dil RA, triv MR, mild Ao sclerosis w/o stenosis.  . NSTEMI (non-ST elevated myocardial infarction) ([facility]  . PAD (peripheral artery disease) ([facility]   a. 03/2016 s/p PTA/DEB R SFA/popliteal/peroneal; b. 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 08/2017 Atherectomy/DEB dist L SFA/popliteal. PTA of L AT; d. 06/2018 PTA/DEB L SFA/popliteal/PT/AT; e. 01/2019 Stable ABIs.  . Sleep apnea    Past Surgical History:  Procedure Laterality Date  . ABDOMINAL AORTOGRAM N/A 09/10/2017   Procedure: ABDOMINAL AORTOGRAM;  Surgeon: ArWellington HampshireMD;  [facility];  Service: Cardiovascular;  Laterality: N/A;  . AMPUTATION TOE Bilateral    one toe on right, all five toes on the left  . [facility]IAC [facility]  [facility] X1 stent 2.5 x 33 mm xience stent Distal TCA  . LOWER EXTREMITY ANGIOGRAPHY Bilateral 09/10/2017   Procedure: Lower Extremity Angiography;  Surgeon: ArWellington HampshireMD;  [facility];  Service: Cardiovascular;  Laterality: Bilateral;  . LOWER EXTREMITY ANGIOGRAPHY Left 06/23/2018   Procedure: LOWER EXTREMITY ANGIOGRAPHY;  Surgeon: ScKatha CabalMD;  [facility];  Service: Cardiovascular;  Laterality:  Left;  . LOWER EXTREMITY ANGIOGRAPHY Right 02/01/2020   Procedure: LOWER EXTREMITY ANGIOGRAPHY;  Surgeon: ScKatha CabalMD;  [facility];  Service: Cardiovascular;  Laterality: Right;  . PERIPHERAL VASCULAR ATHERECTOMY Left 09/10/2017   Procedure: PERIPHERAL VASCULAR ATHERECTOMY;  Surgeon: ArWellington HampshireMD;  [facility];  Service: Cardiovascular;  Laterality: Left;  SFA/POPLITEAL  . PERIPHERAL VASCULAR BALLOON ANGIOPLASTY Left 09/10/2017   Procedure: PERIPHERAL VASCULAR BALLOON ANGIOPLASTY;  Surgeon: Wellington Hampshire, MD;  Location: [place] [facility];  Service: Cardiovascular;  Laterality: Left;  ANTERIAL TIBIAL  . PERIPHERAL VASCULAR CATHETERIZATION Right 03/26/2016   Procedure: Lower Extremity Angiography;  Surgeon: Katha Cabal, MD;  Location: [place] [facility];  Service: Cardiovascular;  Laterality: Right;  . PERIPHERAL VASCULAR INTERVENTION Left 09/10/2017   Procedure: PERIPHERAL VASCULAR INTERVENTION;  Surgeon: Wellington Hampshire, MD;  Location: [place] [facility];  Service: Cardiovascular;  Laterality: Left;  SFA/POPLITEAL   Current Outpatient Medications on File Prior to Visit  Medication Sig Dispense Refill  . albuterol (VENTOLIN HFA) 108 (90 Base) MCG/ACT inhaler Inhale into the lungs.    Marland Kitchen amiodarone (PACERONE) 200 MG tablet Take 200 mg by mouth 2 (two) times daily. (Patient not taking: Reported on 08/15/2020)    . apixaban (ELIQUIS) 5 MG TABS tablet Take 1 tablet (5 mg total) by mouth 2 (two) times daily. 60 tablet 3  . atorvastatin (LIPITOR) 40 MG tablet Take 40 mg by mouth every evening.   1  . cinacalcet (SENSIPAR) 30 MG tablet Take 30 mg by mouth daily.    Marland Kitchen doxycycline (VIBRAMYCIN) 100 MG capsule Take by mouth.    . escitalopram (LEXAPRO) 20 MG tablet Take 20 mg by mouth daily.     . famotidine (PEPCID) 20 MG tablet Take 20 mg by mouth daily.    . fluticasone (FLONASE) 50 MCG/ACT nasal spray Place into the nose.    Marland Kitchen glipiZIDE  (GLUCOTROL) 5 MG tablet Take 5 mg by mouth daily before breakfast.     . insulin detemir (LEVEMIR) 100 UNIT/ML injection Inject 0.05 mLs (5 Units total) into the skin 2 (two) times daily. 10 mL 3  . levofloxacin (LEVAQUIN) 500 MG tablet     . loperamide (IMODIUM) 2 MG capsule Take 2 mg by mouth as needed for diarrhea or loose stools. (Patient not taking: Reported on 08/15/2020)    . metoprolol succinate (TOPROL-XL) 50 MG 24 hr tablet     . montelukast (SINGULAIR) 10 MG tablet Take 10 mg by mouth daily.    Marland Kitchen SANTYL ointment  (Patient not taking: Reported on 08/15/2020)    . sevelamer carbonate (RENVELA) 800 MG tablet Take 1,600-3,200 mg by mouth See admin instructions. Take 4 tablets ('3200mg'$ ) three times daily with meals and take 1 tablet ('800mg'$ ) by mouth with snacks     No current facility-administered medications on file prior to visit.   Allergies  Allergen Reactions  . Oxycodone-Acetaminophen Itching  . Gabapentin Other (See Comments)  . Tramadol Other (See Comments)  . Penicillins Rash    Has patient had a PCN reaction causing immediate rash, facial/tongue/throat swelling, SOB or lightheadedness with hypotension: Yes Has patient had a PCN reaction causing severe rash involving mucus membranes or skin necrosis: Yes Has patient had a PCN reaction that required hospitalization: No Has patient had a PCN reaction occurring within the last 10 years: No If all of the above answers are "NO", then may proceed with Cephalosporin use.    Social History   Socioeconomic History  . Marital status: Single    Spouse name: Not on file  . Number of children: Not on file  . Years of education: Not on file  . Highest education level: Not on file  Occupational History  . Not on file  Tobacco Use  .  Smoking status: Former Smoker    Types: Cigarettes    Quit date: 2010    Years since quitting: 12.0  . Smokeless tobacco: Never Used  Vaping Use  . Vaping Use: Never used  Substance and Sexual  Activity  . Alcohol use: Not Currently  . Drug use: No  . Sexual activity: Not on file  Other Topics Concern  . Not on file  Social History Narrative  . Not on file   Social Determinants of Health   Financial Resource Strain: Not on file  Food Insecurity: Not on file  Transportation Needs: Not on file  Physical Activity: Not on file  Stress: Not on file  Social Connections: Not on file  Intimate Partner Violence: Not on file     Vitals BP 104/70   Pulse 75   Temp 98.3 F (36.8 C) (Oral)   SpO2 100%    Examination  General - not in acute distress, comfortably sitting in chair, HEENT - PEERLA, no pallor and no icterus, no oral candidiasis Chest - b/l clear air entry, no additional sounds CVS- Normal s1s2, RRR Abdomen - Soft, Non tender , non distended Ext-        Neuro: grossly normal Back - WNL Psych : calm and cooperative   Recent labs CBC Latest Ref Rng & Units 03/10/2020 03/09/2020 03/08/2020  WBC 4.0 - 10.5 K/uL 13.5(H) 13.5(H) 17.5(H)  Hemoglobin 13.0 - 17.0 g/dL 10.8(L) 10.7(L) 11.8(L)  Hematocrit 39.0 - 52.0 % 34.2(L) 34.6(L) 38.0(L)  Platelets 150 - 400 K/uL 167 178 182   CMP Latest Ref Rng & Units 08/15/2020 03/10/2020 03/09/2020  Glucose 70 - 99 mg/dL - 114(H) 273(H)  BUN 6 - 20 mg/dL 41(H) 62(H) 47(H)  Creatinine 0.61 - 1.24 mg/dL 4.81(H) 6.98(H) 5.52(H)  Sodium 135 - 145 mmol/L - 129(L) 133(L)  Potassium 3.5 - 5.1 mmol/L - 4.2 4.2  Chloride 98 - 111 mmol/L - 88(L) 91(L)  CO2 22 - 32 mmol/L - 28 27  Calcium 8.9 - 10.3 mg/dL - 8.0(L) 8.0(L)  Total Protein 6.5 - 8.1 g/dL - - -  Total Bilirubin 0.3 - 1.2 mg/dL - - -  Alkaline Phos 38 - 126 U/L - - -  AST 15 - 41 U/L - - -  ALT 0 - 44 U/L - - -     Pertinent Microbiology Results for orders placed or performed during the hospital encounter of 08/11/20  SARS CORONAVIRUS 2 (TAT 6-24 HRS) Nasopharyngeal Nasopharyngeal Swab     Status: None   Collection Time: 08/11/20  2:03 PM   Specimen:  Nasopharyngeal Swab  Result Value Ref Range Status   SARS Coronavirus 2 NEGATIVE NEGATIVE Final    Comment: (NOTE) SARS-CoV-2 target nucleic acids are NOT DETECTED.  The SARS-CoV-2 RNA is generally detectable in upper and lower respiratory specimens during the acute phase of infection. Negative results do not preclude SARS-CoV-2 infection, do not rule out co-infections with other pathogens, and should not be used as the sole basis for treatment or other patient management decisions. Negative results must be combined with clinical observations, patient history, and epidemiological information. The expected result is Negative.  Fact Sheet for Patients: SugarRoll.be  Fact Sheet for Healthcare Providers: https://www.woods-mathews.com/  This test is not yet approved or cleared by the [place] FDA and  has been authorized for detection and/or diagnosis of SARS-CoV-2 by FDA under an Emergency Use Authorization (EUA). This EUA will remain  in effect (meaning this test can be used) for  the duration of the COVID-19 declaration under Se ction 564(b)(1) of the Act, 21 U.S.C. section 360bbb-3(b)(1), unless the authorization is terminated or revoked sooner.  Performed at [facility], [place] [address] [place], [place] [address]       Pertinent Imaging MRI Rt Foot 08/22/2020  FINDINGS: Evidence of prior amputations of the second and third toes. The second metatarsal is amputated down to the proximal third and the fourth metatarsal is amputated at the neck region.  There is mild T1 and T2 signal abnormality at the amputation site of the second metatarsal. Is also slight enhancement. Could not exclude early/mild changes of osteomyelitis.  There is an open wound on the plantar aspect of the forefoot located in the region of the second MTP joint. This communicates with a rim enhancing abscess which measures approximately 17 x 17 x 17 mm  and contains gas. Surrounding moderate enhancing inflammatory changes. I do not see any findings suspicious for septic arthritis and no other definite findings for osteomyelitis.  Mild changes of cellulitis and myositis.  IMPRESSION: 1. Open wound on the plantar aspect of the forefoot located in the region of the second MTP joint. This communicates with a rim enhancing abscess which contains gas. 2. Mild T1 and T2 signal abnormality and enhancement at the amputation site of the second metatarsal. Could not exclude early/mild changes of osteomyelitis. 3. Evidence of prior amputations of the second and third toes and metatarsals. 4. Mild changes of cellulitis and myositis.   All pertinent labs/Imagings/notes reviewed. All pertinent plain films and CT images have been personally visualized and interpreted; radiology reports have been reviewed. Decision making incorporated into the Impression / Recommendations.  I spent approximately 60 minutes with the patient including  review of prior medical records, coordination of care with other specialist with greater than 50% of time in face to face counsel of the patient Electronically signed by:  Rosiland Oz, MD Infectious Disease Physician [facility] [address] [place], [place] [address] Phone: [phone]  Fax: [phone]

## 2020-08-22 NOTE — Progress Notes (Unsigned)
RN escorted patient and patient's son to [facility] Emergency Department. Waited with patient while he was checked in and took him to triage area. Patient was alert and oriented and in stable condition when RN left.   Beryle Flock, RN

## 2020-08-22 NOTE — Patient Instructions (Signed)
Please go to the ED, you will need I and ID of the abscess of the Rt foot I have spoken to Dr Boneta Lucks from podiatry that you will be coming to ED [facility] as needed after hospital discharge

## 2020-08-23 NOTE — ED Notes (Signed)
Pt name called 3 times with no answer.

## 2020-08-23 NOTE — ED Notes (Signed)
Pt called no answer 

## 2020-08-24 ENCOUNTER — Encounter (HOSPITAL_COMMUNITY): Payer: Self-pay | Admitting: Internal Medicine

## 2020-08-24 ENCOUNTER — Ambulatory Visit (INDEPENDENT_AMBULATORY_CARE_PROVIDER_SITE_OTHER): Payer: Medicare PPO | Admitting: Vascular Surgery

## 2020-08-24 ENCOUNTER — Encounter (HOSPITAL_COMMUNITY): Payer: Self-pay | Admitting: Podiatry

## 2020-08-24 ENCOUNTER — Encounter (HOSPITAL_COMMUNITY): Payer: Self-pay | Admitting: Anesthesiology

## 2020-08-24 ENCOUNTER — Other Ambulatory Visit: Payer: Self-pay

## 2020-08-24 ENCOUNTER — Ambulatory Visit: Payer: Medicare PPO | Admitting: Physician Assistant

## 2020-08-24 ENCOUNTER — Inpatient Hospital Stay (HOSPITAL_COMMUNITY)
Admission: EM | Admit: 2020-08-24 | Discharge: 2020-08-29 | DRG: 638 | Disposition: A | Discharge status: Home / Self Care | Payer: Medicare PPO | Attending: Internal Medicine | Admitting: Internal Medicine

## 2020-08-24 ENCOUNTER — Encounter (HOSPITAL_COMMUNITY): Payer: Self-pay | Admitting: Emergency Medicine

## 2020-08-24 DIAGNOSIS — D631 Anemia in chronic kidney disease: Secondary | ICD-10-CM | POA: Diagnosis present

## 2020-08-24 DIAGNOSIS — Z794 Long term (current) use of insulin: Secondary | ICD-10-CM | POA: Diagnosis not present

## 2020-08-24 DIAGNOSIS — Z8249 Family history of ischemic heart disease and other diseases of the circulatory system: Secondary | ICD-10-CM

## 2020-08-24 DIAGNOSIS — E785 Hyperlipidemia, unspecified: Secondary | ICD-10-CM | POA: Diagnosis present

## 2020-08-24 DIAGNOSIS — I2510 Atherosclerotic heart disease of native coronary artery without angina pectoris: Secondary | ICD-10-CM | POA: Diagnosis present

## 2020-08-24 DIAGNOSIS — E875 Hyperkalemia: Secondary | ICD-10-CM | POA: Diagnosis present

## 2020-08-24 DIAGNOSIS — D696 Thrombocytopenia, unspecified: Secondary | ICD-10-CM | POA: Diagnosis present

## 2020-08-24 DIAGNOSIS — E11649 Type 2 diabetes mellitus with hypoglycemia without coma: Secondary | ICD-10-CM | POA: Diagnosis present

## 2020-08-24 DIAGNOSIS — Z6830 Body mass index (BMI) 30.0-30.9, adult: Secondary | ICD-10-CM | POA: Diagnosis not present

## 2020-08-24 DIAGNOSIS — I480 Paroxysmal atrial fibrillation: Secondary | ICD-10-CM | POA: Diagnosis present

## 2020-08-24 DIAGNOSIS — I4891 Unspecified atrial fibrillation: Secondary | ICD-10-CM | POA: Diagnosis not present

## 2020-08-24 DIAGNOSIS — E669 Obesity, unspecified: Secondary | ICD-10-CM | POA: Diagnosis present

## 2020-08-24 DIAGNOSIS — N2581 Secondary hyperparathyroidism of renal origin: Secondary | ICD-10-CM | POA: Diagnosis present

## 2020-08-24 DIAGNOSIS — I70213 Atherosclerosis of native arteries of extremities with intermittent claudication, bilateral legs: Secondary | ICD-10-CM | POA: Diagnosis not present

## 2020-08-24 DIAGNOSIS — Z880 Allergy status to penicillin: Secondary | ICD-10-CM | POA: Diagnosis not present

## 2020-08-24 DIAGNOSIS — I255 Ischemic cardiomyopathy: Secondary | ICD-10-CM | POA: Diagnosis present

## 2020-08-24 DIAGNOSIS — I10 Essential (primary) hypertension: Secondary | ICD-10-CM | POA: Diagnosis not present

## 2020-08-24 DIAGNOSIS — I120 Hypertensive chronic kidney disease with stage 5 chronic kidney disease or end stage renal disease: Secondary | ICD-10-CM | POA: Diagnosis present

## 2020-08-24 DIAGNOSIS — I517 Cardiomegaly: Secondary | ICD-10-CM | POA: Diagnosis present

## 2020-08-24 DIAGNOSIS — E11621 Type 2 diabetes mellitus with foot ulcer: Secondary | ICD-10-CM | POA: Diagnosis present

## 2020-08-24 DIAGNOSIS — L02619 Cutaneous abscess of unspecified foot: Secondary | ICD-10-CM | POA: Diagnosis present

## 2020-08-24 DIAGNOSIS — G4730 Sleep apnea, unspecified: Secondary | ICD-10-CM | POA: Diagnosis present

## 2020-08-24 DIAGNOSIS — L02611 Cutaneous abscess of right foot: Secondary | ICD-10-CM | POA: Diagnosis present

## 2020-08-24 DIAGNOSIS — D509 Iron deficiency anemia, unspecified: Secondary | ICD-10-CM | POA: Diagnosis present

## 2020-08-24 DIAGNOSIS — Z886 Allergy status to analgesic agent status: Secondary | ICD-10-CM

## 2020-08-24 DIAGNOSIS — Z87891 Personal history of nicotine dependence: Secondary | ICD-10-CM

## 2020-08-24 DIAGNOSIS — L89151 Pressure ulcer of sacral region, stage 1: Secondary | ICD-10-CM | POA: Diagnosis present

## 2020-08-24 DIAGNOSIS — Z992 Dependence on renal dialysis: Secondary | ICD-10-CM

## 2020-08-24 DIAGNOSIS — M86671 Other chronic osteomyelitis, right ankle and foot: Secondary | ICD-10-CM | POA: Diagnosis not present

## 2020-08-24 DIAGNOSIS — Z7901 Long term (current) use of anticoagulants: Secondary | ICD-10-CM

## 2020-08-24 DIAGNOSIS — I252 Old myocardial infarction: Secondary | ICD-10-CM

## 2020-08-24 DIAGNOSIS — Z79899 Other long term (current) drug therapy: Secondary | ICD-10-CM

## 2020-08-24 DIAGNOSIS — I7025 Atherosclerosis of native arteries of other extremities with ulceration: Secondary | ICD-10-CM | POA: Diagnosis not present

## 2020-08-24 DIAGNOSIS — L089 Local infection of the skin and subcutaneous tissue, unspecified: Secondary | ICD-10-CM

## 2020-08-24 DIAGNOSIS — M86179 Other acute osteomyelitis, unspecified ankle and foot: Secondary | ICD-10-CM

## 2020-08-24 DIAGNOSIS — Z955 Presence of coronary angioplasty implant and graft: Secondary | ICD-10-CM

## 2020-08-24 DIAGNOSIS — M86171 Other acute osteomyelitis, right ankle and foot: Secondary | ICD-10-CM

## 2020-08-24 DIAGNOSIS — E1122 Type 2 diabetes mellitus with diabetic chronic kidney disease: Secondary | ICD-10-CM | POA: Diagnosis not present

## 2020-08-24 DIAGNOSIS — Z89431 Acquired absence of right foot: Secondary | ICD-10-CM

## 2020-08-24 DIAGNOSIS — Z885 Allergy status to narcotic agent status: Secondary | ICD-10-CM

## 2020-08-24 DIAGNOSIS — E11628 Type 2 diabetes mellitus with other skin complications: Secondary | ICD-10-CM | POA: Diagnosis present

## 2020-08-24 DIAGNOSIS — B952 Enterococcus as the cause of diseases classified elsewhere: Secondary | ICD-10-CM | POA: Diagnosis present

## 2020-08-24 DIAGNOSIS — L97509 Non-pressure chronic ulcer of other part of unspecified foot with unspecified severity: Secondary | ICD-10-CM | POA: Diagnosis present

## 2020-08-24 DIAGNOSIS — N186 End stage renal disease: Secondary | ICD-10-CM | POA: Diagnosis present

## 2020-08-24 DIAGNOSIS — Z89422 Acquired absence of other left toe(s): Secondary | ICD-10-CM

## 2020-08-24 DIAGNOSIS — Z20822 Contact with and (suspected) exposure to covid-19: Secondary | ICD-10-CM | POA: Diagnosis present

## 2020-08-24 DIAGNOSIS — I70219 Atherosclerosis of native arteries of extremities with intermittent claudication, unspecified extremity: Secondary | ICD-10-CM | POA: Diagnosis present

## 2020-08-24 DIAGNOSIS — Z8616 Personal history of COVID-19: Secondary | ICD-10-CM

## 2020-08-24 DIAGNOSIS — E119 Type 2 diabetes mellitus without complications: Secondary | ICD-10-CM

## 2020-08-24 DIAGNOSIS — B965 Pseudomonas (aeruginosa) (mallei) (pseudomallei) as the cause of diseases classified elsewhere: Secondary | ICD-10-CM | POA: Diagnosis present

## 2020-08-24 DIAGNOSIS — I25118 Atherosclerotic heart disease of native coronary artery with other forms of angina pectoris: Secondary | ICD-10-CM

## 2020-08-24 DIAGNOSIS — Z7984 Long term (current) use of oral hypoglycemic drugs: Secondary | ICD-10-CM

## 2020-08-24 DIAGNOSIS — E1151 Type 2 diabetes mellitus with diabetic peripheral angiopathy without gangrene: Secondary | ICD-10-CM | POA: Diagnosis present

## 2020-08-24 DIAGNOSIS — I70211 Atherosclerosis of native arteries of extremities with intermittent claudication, right leg: Secondary | ICD-10-CM | POA: Diagnosis not present

## 2020-08-24 DIAGNOSIS — I1311 Hypertensive heart and chronic kidney disease without heart failure, with stage 5 chronic kidney disease, or end stage renal disease: Secondary | ICD-10-CM | POA: Diagnosis present

## 2020-08-24 LAB — CBC WITH DIFFERENTIAL/PLATELET
Abs Immature Granulocytes: 0.04 10*3/uL (ref 0.00–0.07)
Basophils Absolute: 0.1 10*3/uL (ref 0.0–0.1)
Basophils Relative: 1 %
Eosinophils Absolute: 0.2 10*3/uL (ref 0.0–0.5)
Eosinophils Relative: 4 %
HCT: 39.2 % (ref 39.0–52.0)
Hemoglobin: 12.5 g/dL — ABNORMAL LOW (ref 13.0–17.0)
Immature Granulocytes: 1 %
Lymphocytes Relative: 22 %
Lymphs Abs: 1.3 10*3/uL (ref 0.7–4.0)
MCH: 30.7 pg (ref 26.0–34.0)
MCHC: 31.9 g/dL (ref 30.0–36.0)
MCV: 96.3 fL (ref 80.0–100.0)
Monocytes Absolute: 0.5 10*3/uL (ref 0.1–1.0)
Monocytes Relative: 9 %
Neutro Abs: 3.9 10*3/uL (ref 1.7–7.7)
Neutrophils Relative %: 63 %
Platelets: 142 10*3/uL — ABNORMAL LOW (ref 150–400)
RBC: 4.07 MIL/uL — ABNORMAL LOW (ref 4.22–5.81)
RDW: 19.3 % — ABNORMAL HIGH (ref 11.5–15.5)
WBC: 6 10*3/uL (ref 4.0–10.5)
nRBC: 0 % (ref 0.0–0.2)

## 2020-08-24 LAB — SARS CORONAVIRUS 2 BY RT PCR (HOSPITAL ORDER, PERFORMED IN ~~LOC~~ HOSPITAL LAB): SARS Coronavirus 2: NEGATIVE

## 2020-08-24 LAB — COMPREHENSIVE METABOLIC PANEL
ALT: 13 U/L (ref 0–44)
AST: 20 U/L (ref 15–41)
Albumin: 3.2 g/dL — ABNORMAL LOW (ref 3.5–5.0)
Alkaline Phosphatase: 109 U/L (ref 38–126)
Anion gap: 14 (ref 5–15)
BUN: 33 mg/dL — ABNORMAL HIGH (ref 6–20)
CO2: 29 mmol/L (ref 22–32)
Calcium: 9.3 mg/dL (ref 8.9–10.3)
Chloride: 93 mmol/L — ABNORMAL LOW (ref 98–111)
Creatinine, Ser: 4.41 mg/dL — ABNORMAL HIGH (ref 0.61–1.24)
GFR, Estimated: 15 mL/min — ABNORMAL LOW (ref >60)
Glucose, Bld: 140 mg/dL — ABNORMAL HIGH (ref 70–99)
Potassium: 5 mmol/L (ref 3.5–5.1)
Sodium: 136 mmol/L (ref 135–145)
Total Bilirubin: 1 mg/dL (ref 0.3–1.2)
Total Protein: 6.9 g/dL (ref 6.5–8.1)

## 2020-08-24 LAB — C-REACTIVE PROTEIN: CRP: 2.3 mg/dL — ABNORMAL HIGH (ref <1.0)

## 2020-08-24 LAB — GLUCOSE, CAPILLARY: Glucose-Capillary: 108 mg/dL — ABNORMAL HIGH (ref 70–99)

## 2020-08-24 LAB — SEDIMENTATION RATE: Sed Rate: 18 mm/hr — ABNORMAL HIGH (ref 0–16)

## 2020-08-24 LAB — HIV ANTIBODY (ROUTINE TESTING W REFLEX): HIV Screen 4th Generation wRfx: NONREACTIVE

## 2020-08-24 LAB — LACTIC ACID, PLASMA: Lactic Acid, Venous: 1.9 mmol/L (ref 0.5–1.9)

## 2020-08-24 MED ORDER — INSULIN ASPART 100 UNIT/ML ~~LOC~~ SOLN
0.0000 [IU] | Freq: Four times a day (QID) | SUBCUTANEOUS | Status: DC
  Administered 2020-08-25: 1 [IU] via SUBCUTANEOUS

## 2020-08-24 MED ORDER — SODIUM CHLORIDE 0.9 % IV SOLN
250.0000 mL | INTRAVENOUS | Status: DC | PRN
Start: 2020-08-24 — End: 2020-08-29
  Administered 2020-08-28: 250 mL via INTRAVENOUS

## 2020-08-24 MED ORDER — HYDROMORPHONE HCL 1 MG/ML IJ SOLN: 0.5000 mg | INTRAMUSCULAR | Status: DC | PRN

## 2020-08-24 MED ORDER — ONDANSETRON HCL 4 MG/2ML IJ SOLN
4.0000 mg | Freq: Four times a day (QID) | INTRAMUSCULAR | Status: DC | PRN
  Administered 2020-08-25: 4 mg via INTRAVENOUS
  Filled 2020-08-24: qty 2

## 2020-08-24 MED ORDER — CIPROFLOXACIN IN D5W 400 MG/200ML IV SOLN
400.0000 mg | Freq: Once | INTRAVENOUS | Status: AC
Start: 2020-08-24 — End: 2020-08-24
  Administered 2020-08-24: 400 mg via INTRAVENOUS
  Filled 2020-08-24: qty 200

## 2020-08-24 MED ORDER — CIPROFLOXACIN IN D5W 400 MG/200ML IV SOLN
400.0000 mg | INTRAVENOUS | Status: DC
  Administered 2020-08-25 – 2020-08-27 (×3): 400 mg via INTRAVENOUS
  Filled 2020-08-24 (×4): qty 200

## 2020-08-24 MED ORDER — ATORVASTATIN CALCIUM 40 MG PO TABS
40.0000 mg | ORAL_TABLET | Freq: Every day | ORAL | Status: DC
  Administered 2020-08-24 – 2020-08-28 (×5): 40 mg via ORAL
  Filled 2020-08-24 (×5): qty 1

## 2020-08-24 MED ORDER — HYDRALAZINE HCL 20 MG/ML IJ SOLN: 10.0000 mg | Freq: Four times a day (QID) | INTRAMUSCULAR | Status: DC | PRN

## 2020-08-24 MED ORDER — METOPROLOL SUCCINATE ER 50 MG PO TB24
50.0000 mg | ORAL_TABLET | Freq: Two times a day (BID) | ORAL | Status: DC
  Administered 2020-08-24 – 2020-08-29 (×7): 50 mg via ORAL
  Filled 2020-08-24 (×10): qty 1

## 2020-08-24 MED ORDER — SODIUM CHLORIDE 0.9% FLUSH: 3.0000 mL | INTRAVENOUS | Status: DC | PRN

## 2020-08-24 MED ORDER — SEVELAMER CARBONATE 800 MG PO TABS
800.0000 mg | ORAL_TABLET | Freq: Every day | ORAL | Status: DC | PRN
Start: 2020-08-24 — End: 2020-08-29

## 2020-08-24 MED ORDER — INSULIN DETEMIR 100 UNIT/ML ~~LOC~~ SOLN
5.0000 [IU] | Freq: Every day | SUBCUTANEOUS | Status: DC
  Administered 2020-08-24 – 2020-08-25 (×2): 5 [IU] via SUBCUTANEOUS
  Filled 2020-08-24 (×3): qty 0.05

## 2020-08-24 MED ORDER — SORBITOL 70 % SOLN
30.0000 mL | Freq: Every day | Status: DC | PRN
  Filled 2020-08-24: qty 30

## 2020-08-24 MED ORDER — CINACALCET HCL 30 MG PO TABS
30.0000 mg | ORAL_TABLET | Freq: Every day | ORAL | Status: DC
  Administered 2020-08-25 – 2020-08-28 (×4): 30 mg via ORAL
  Filled 2020-08-24 (×6): qty 1

## 2020-08-24 MED ORDER — SODIUM CHLORIDE 0.9% FLUSH
3.0000 mL | Freq: Two times a day (BID) | INTRAVENOUS | Status: DC
  Administered 2020-08-26: 3 mL via INTRAVENOUS

## 2020-08-24 MED ORDER — ALBUTEROL SULFATE HFA 108 (90 BASE) MCG/ACT IN AERS
2.0000 | INHALATION_SPRAY | Freq: Four times a day (QID) | RESPIRATORY_TRACT | Status: DC | PRN
  Filled 2020-08-24: qty 6.7

## 2020-08-24 MED ORDER — CLINDAMYCIN PHOSPHATE 600 MG/50ML IV SOLN
600.0000 mg | Freq: Once | INTRAVENOUS | Status: AC
  Administered 2020-08-24: 600 mg via INTRAVENOUS
  Filled 2020-08-24: qty 50

## 2020-08-24 MED ORDER — FLUTICASONE PROPIONATE 50 MCG/ACT NA SUSP
2.0000 | Freq: Two times a day (BID) | NASAL | Status: DC
  Administered 2020-08-25 – 2020-08-29 (×5): 2 via NASAL
  Filled 2020-08-24: qty 16

## 2020-08-24 MED ORDER — SODIUM CHLORIDE 0.9% FLUSH
3.0000 mL | Freq: Two times a day (BID) | INTRAVENOUS | Status: DC
  Administered 2020-08-25 – 2020-08-28 (×5): 3 mL via INTRAVENOUS

## 2020-08-24 MED ORDER — MIRTAZAPINE 15 MG PO TABS
7.5000 mg | ORAL_TABLET | Freq: Every day | ORAL | Status: DC
  Administered 2020-08-24 – 2020-08-28 (×5): 7.5 mg via ORAL
  Filled 2020-08-24 (×5): qty 1

## 2020-08-24 MED ORDER — HEPARIN SODIUM (PORCINE) 5000 UNIT/ML IJ SOLN
5000.0000 [IU] | Freq: Three times a day (TID) | INTRAMUSCULAR | Status: DC
  Administered 2020-08-25 – 2020-08-27 (×5): 5000 [IU] via SUBCUTANEOUS
  Filled 2020-08-24 (×7): qty 1

## 2020-08-24 MED ORDER — LEVALBUTEROL HCL 0.63 MG/3ML IN NEBU
0.6300 mg | INHALATION_SOLUTION | Freq: Four times a day (QID) | RESPIRATORY_TRACT | Status: DC | PRN
  Filled 2020-08-24: qty 3

## 2020-08-24 MED ORDER — SEVELAMER CARBONATE 800 MG PO TABS
3200.0000 mg | ORAL_TABLET | Freq: Three times a day (TID) | ORAL | Status: DC
  Administered 2020-08-24 – 2020-08-29 (×11): 3200 mg via ORAL
  Filled 2020-08-24 (×12): qty 4

## 2020-08-24 MED ORDER — TRAMADOL HCL 50 MG PO TABS
50.0000 mg | ORAL_TABLET | Freq: Two times a day (BID) | ORAL | Status: DC | PRN
Start: 2020-08-24 — End: 2020-08-29

## 2020-08-24 MED ORDER — VANCOMYCIN VARIABLE DOSE PER UNSTABLE RENAL FUNCTION (PHARMACIST DOSING): Status: DC

## 2020-08-24 MED ORDER — VANCOMYCIN HCL 2000 MG/400ML IV SOLN
2000.0000 mg | Freq: Once | INTRAVENOUS | Status: AC
  Administered 2020-08-24: 2000 mg via INTRAVENOUS
  Filled 2020-08-24: qty 400

## 2020-08-24 MED ORDER — INSULIN ASPART 100 UNIT/ML ~~LOC~~ SOLN: 0.0000 [IU] | Freq: Three times a day (TID) | SUBCUTANEOUS | Status: DC

## 2020-08-24 MED ORDER — MONTELUKAST SODIUM 10 MG PO TABS
10.0000 mg | ORAL_TABLET | Freq: Every day | ORAL | Status: DC
  Administered 2020-08-24 – 2020-08-28 (×5): 10 mg via ORAL
  Filled 2020-08-24 (×5): qty 1

## 2020-08-24 MED ORDER — SENNOSIDES-DOCUSATE SODIUM 8.6-50 MG PO TABS: 1.0000 | ORAL_TABLET | Freq: Every evening | ORAL | Status: DC | PRN

## 2020-08-24 MED ORDER — LOPERAMIDE HCL 2 MG PO CAPS: 2.0000 mg | ORAL_CAPSULE | ORAL | Status: DC | PRN

## 2020-08-24 MED ORDER — ONDANSETRON HCL 4 MG PO TABS: 4.0000 mg | ORAL_TABLET | Freq: Four times a day (QID) | ORAL | Status: DC | PRN

## 2020-08-24 MED ORDER — ESCITALOPRAM OXALATE 10 MG PO TABS
10.0000 mg | ORAL_TABLET | Freq: Every day | ORAL | Status: DC
  Administered 2020-08-24 – 2020-08-29 (×6): 10 mg via ORAL
  Filled 2020-08-24 (×6): qty 1

## 2020-08-24 MED ORDER — PANTOPRAZOLE SODIUM 40 MG PO TBEC
40.0000 mg | DELAYED_RELEASE_TABLET | Freq: Every day | ORAL | Status: DC
  Administered 2020-08-25 – 2020-08-29 (×5): 40 mg via ORAL
  Filled 2020-08-24 (×5): qty 1

## 2020-08-24 NOTE — H&P (Signed)
History and Physical    Ralph Peterson [id number] DOB: [birth year] DOA: 08/24/2020  PCP: Vidal Schwalbe, MD /nephrology in [place], Dr. Marlowe Sax Patient coming from: Home  I have personally briefly reviewed patient's old medical records in [place]  Chief Complaint: Foot pain/told to come to the hospital to be admitted for IV antibiotics and surgery on the foot per podiatry, Dr. Posey Pronto  HPI: Ralph Peterson is a 55 y.o. 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 It is noted that patient had an MRI of his foot done on 08/22/2020 which was concerning for right foot plantar abscess at the second metatarsal amputation site/possible osteomyelitis.  Patient was seen by ID on 08/22/2020 when he was sent there from the wound care center and ID, Dr. West Bali assessed the patient spoke with podiatrist, Dr. Boneta Lucks who had recommended patient be sent to the ED for IV antibiotics and surgical intervention. Patient stated presented to the ED 1 day prior to admission however sat in the lobby for approximately 14 hours was not seen by provider and subsequently left and went home. Patient denies any fevers, no chills, no nausea, no vomiting, no chest pain, no shortness of breath, no abdominal pain, no diarrhea, no constipation, no melena, no hematemesis, no hematochezia, no syncope, no dizziness.  Patient does  endorse some right foot pain that he rates as a 2/10. ED provider stated spoke with Dr. Posey Pronto who recommended patient be admitted for IV antibiotics and possible debridement in the OR to be done tomorrow 08/25/2020.  ED Course: Patient seen in the ED, lactic acid level done 1.9.  Basic metabolic profile with a sodium of 136, chloride of 93, glucose of 140, BUN of 33, creatinine of 4.41 otherwise was within normal limits.  CBC done with a white count of 6, hemoglobin of 12.5, platelet count of 142 otherwise was within normal limits.  SARS coronavirus 2 PCR done was negative.  Patient given IV ciprofloxacin x1 dose and IV clindamycin x1 dose.  Hospitalist called to admit the patient for further evaluation and management.  Review of Systems: As per HPI otherwise all other systems reviewed and are negative.  Past Medical History:  Diagnosis Date  . Anxiety   . Arrhythmia    atrial fibrillation  . Atrial fibrillation ([place])   . CAD (coronary artery disease)    a. 09/2010 Cath/PCI ([facility]): LM nl, LAD 34m D1 80 (small), LCX 34mOM1 30, RI 70 (small), RCA 70 (DES).  . CHF (congestive heart failure) ([facility]  . Coronary artery disease   . COVID-19 virus infection 07/2019  . Diabetes mellitus without complication ([facility]  . ESRD (end stage renal disease) ([facility]  . Hyperlipidemia   . Hypertension   . Ischemic cardiomyopathy    a.  12/2011 Echo ([facility]): NL EF, mod LVH. Mild AS/MS, triv PR/TR. 07/2019 Echo: EF 25-30%, GR1 DD, inf/post HK, low nl RV fxn, mildly dil RA, triv MR, mild Ao sclerosis w/o stenosis.  .Marland Kitchen  NSTEMI (non-ST elevated myocardial infarction) ([place]) 07/2019  . PAD (peripheral artery disease) ([place])    a. 03/2016 s/p PTA/DEB R SFA/popliteal/peroneal; b. 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 08/2017 Atherectomy/DEB dist L SFA/popliteal. PTA of L AT; d. 06/2018 PTA/DEB L SFA/popliteal/PT/AT; e. 01/2019 Stable ABIs.  . Sleep apnea     Past Surgical History:  Procedure Laterality Date  . ABDOMINAL  AORTOGRAM N/A 09/10/2017   Procedure: ABDOMINAL AORTOGRAM;  Surgeon: Wellington Hampshire, MD;  Location: [place] [facility];  Service: Cardiovascular;  Laterality: N/A;  . AMPUTATION TOE Bilateral    one toe on right, all five toes on the left  . CARDIAC CATHETERIZATION  2012   [facility]; X1 stent 2.5 x 33 mm xience stent Distal TCA  . LOWER EXTREMITY ANGIOGRAPHY Bilateral 09/10/2017   Procedure: Lower Extremity Angiography;  Surgeon: Wellington Hampshire, MD;  Location: [place] [facility];  Service: Cardiovascular;  Laterality: Bilateral;  . LOWER EXTREMITY ANGIOGRAPHY Left 06/23/2018   Procedure: LOWER EXTREMITY ANGIOGRAPHY;  Surgeon: Katha Cabal, MD;  Location: [place] [facility];  Service: Cardiovascular;  Laterality: Left;  . LOWER EXTREMITY ANGIOGRAPHY Right 02/01/2020   Procedure: LOWER EXTREMITY ANGIOGRAPHY;  Surgeon: Katha Cabal, MD;  Location: [place] [facility];  Service: Cardiovascular;  Laterality: Right;  . PERIPHERAL VASCULAR ATHERECTOMY Left 09/10/2017   Procedure: PERIPHERAL VASCULAR ATHERECTOMY;  Surgeon: Wellington Hampshire, MD;  Location: [place] [facility];  Service: Cardiovascular;  Laterality: Left;  SFA/POPLITEAL  . PERIPHERAL VASCULAR BALLOON ANGIOPLASTY Left 09/10/2017   Procedure: PERIPHERAL VASCULAR BALLOON ANGIOPLASTY;  Surgeon: Wellington Hampshire, MD;  Location: [place] [facility];  Service: Cardiovascular;  Laterality: Left;  ANTERIAL TIBIAL  . PERIPHERAL VASCULAR CATHETERIZATION Right 03/26/2016   Procedure: Lower Extremity Angiography;  Surgeon: Katha Cabal, MD;  Location: [place] [facility];  Service: Cardiovascular;  Laterality: Right;  . PERIPHERAL VASCULAR INTERVENTION Left 09/10/2017   Procedure: PERIPHERAL VASCULAR INTERVENTION;  Surgeon: Wellington Hampshire, MD;  Location: [place] [facility];  Service: Cardiovascular;  Laterality: Left;  SFA/POPLITEAL    Social History  reports that he quit smoking about 12 years ago. His smoking use  included cigarettes. He has never used smokeless tobacco. He reports previous alcohol use. He reports that he does not use drugs.  Allergies  Allergen Reactions  . Oxycodone-Acetaminophen Itching  . Gabapentin Other (See Comments)    unknown  . Tramadol Other (See Comments)    unknown  . Penicillins Swelling and Rash    Has patient had a PCN reaction causing immediate rash, facial/tongue/throat swelling, SOB or lightheadedness with hypotension: Yes Has patient had a PCN reaction causing severe rash involving mucus membranes or skin necrosis: Yes Has patient had a PCN reaction that required hospitalization: No Has patient had a PCN reaction occurring within the last 10 years: No If all of the above answers are "NO", then may proceed with Cephalosporin use.     Family History  Problem Relation Age of Onset  . Hypertension Mother    Mother alive age 34 with history of hypertension, depression.  Father alive age 62  Prior to Admission medications   Medication Sig Start Date End Date Taking? Authorizing Provider  albuterol (VENTOLIN HFA) 108 (90 Base) MCG/ACT inhaler Inhale 2 puffs into the lungs every 6 (six) hours as needed for wheezing or shortness of breath. 08/01/20 08/01/21  [provider]  apixaban (ELIQUIS) 5 MG TABS tablet Take 1 tablet (  5 mg total) by mouth 2 (two) times daily. 03/10/20 04/18/20  ShahmehdiValeria Batman, MD  atorvastatin (LIPITOR) 40 MG tablet Take 40 mg by mouth at bedtime. 03/31/18   [provider]  B Complex-C-Zn-Folic Acid (DIALYVITE AB-123456789 15 PO) Take 1 tablet by mouth daily.    [provider]  cinacalcet (SENSIPAR) 30 MG tablet Take 30 mg by mouth at bedtime.    [provider]  escitalopram (LEXAPRO) 10 MG tablet Take 10 mg by mouth daily.    [provider]  fluticasone (FLONASE) 50 MCG/ACT nasal spray Place 2 sprays into both nostrils 2 (two) times daily. 08/01/20   [provider]  glipiZIDE (GLUCOTROL) 5  MG tablet Take 5 mg by mouth daily before breakfast.  01/01/19   [provider]  insulin detemir (LEVEMIR) 100 UNIT/ML injection Inject 0.05 mLs (5 Units total) into the skin 2 (two) times daily. 03/10/20 04/18/20  ShahmehdiValeria Batman, MD  loperamide (IMODIUM) 2 MG capsule Take 2 mg by mouth as needed for diarrhea or loose stools.    [provider]  metoprolol succinate (TOPROL-XL) 50 MG 24 hr tablet Take 50 mg by mouth 2 (two) times daily. 03/25/20   [provider]  mirtazapine (REMERON) 7.5 MG tablet Take 7.5 mg by mouth at bedtime. 07/28/20   [provider]  montelukast (SINGULAIR) 10 MG tablet Take 10 mg by mouth at bedtime. 02/17/20   [provider]  omeprazole (PRILOSEC) 20 MG capsule Take 20 mg by mouth at bedtime.    [provider]  sevelamer carbonate (RENVELA) 800 MG tablet Take 1,600-3,200 mg by mouth See admin instructions. Take 4 tablets ('3200mg'$ ) three times daily with meals and take 1 tablet ('800mg'$ ) by mouth with snacks    [provider]  sulfamethoxazole-trimethoprim (BACTRIM DS) 800-160 MG tablet Take 1 tablet by mouth 2 (two) times daily. 08/11/20   [provider]    Physical Exam: Vitals:   08/24/20 1600 08/24/20 1630 08/24/20 1700 08/24/20 1810  BP: 111/73 109/74 118/82 132/84  Pulse: 73 70 73 70  Resp: '18  19 18  '$ Temp:      TempSrc:      SpO2: 97% 97% 100% 96%    Constitutional: NAD, calm, comfortable Vitals:   08/24/20 1600 08/24/20 1630 08/24/20 1700 08/24/20 1810  BP: 111/73 109/74 118/82 132/84  Pulse: 73 70 73 70  Resp: '18  19 18  '$ Temp:      TempSrc:      SpO2: 97% 97% 100% 96%   Eyes: PERRL, lids and conjunctivae normal ENMT: Mucous membranes are dry. Posterior pharynx clear of any exudate or lesions.Normal dentition.  Neck: normal, supple, no masses, no thyromegaly Respiratory: clear to auscultation bilaterally, no wheezing, no crackles. Normal respiratory effort. No accessory muscle  use.  Cardiovascular: Regular rate and rhythm, no murmurs / rubs / gallops. No extremity edema. 2+ pedal pulses. No carotid bruits.  Abdomen: no tenderness, no masses palpated. No hepatosplenomegaly. Bowel sounds positive.  Musculoskeletal: no clubbing / cyanosis.  See picture below.  Skin: See below. Neurologic: CN 2-12 grossly intact. Sensation intact, DTR normal. Strength 5/5 in all 4.  Psychiatric: Normal judgment and insight. Alert and oriented x 3. Normal mood.           Labs on Admission: I have personally reviewed following labs and imaging studies  CBC: Recent Labs  Lab 08/22/20 1727 08/24/20 1500  WBC 6.0 6.0  NEUTROABS 3.7 3.9  HGB 11.9*  12.5*  HCT 38.0* 39.2  MCV 96.4 96.3  PLT 135* 142*    Basic Metabolic Panel: Recent Labs  Lab 08/22/20 1727 08/24/20 1500  NA 133* 136  K 5.0 5.0  CL 93* 93*  CO2 27 29  GLUCOSE 94 140*  BUN 36* 33*  CREATININE 5.44* 4.41*  CALCIUM 9.4 9.3    GFR: Estimated Creatinine Clearance: 24 mL/min (A) (by C-G formula based on SCr of 4.41 mg/dL (H)).  Liver Function Tests: Recent Labs  Lab 08/22/20 1727 08/24/20 1500  AST 18 20  ALT 13 13  ALKPHOS 107 109  BILITOT 1.0 1.0  PROT 6.5 6.9  ALBUMIN 2.9* 3.2*    Urine analysis: No results found for: COLORURINE, APPEARANCEUR, LABSPEC, PHURINE, GLUCOSEU, HGBUR, BILIRUBINUR, KETONESUR, PROTEINUR, UROBILINOGEN, NITRITE, LEUKOCYTESUR  Radiological Exams on Admission: No results found.  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  Patient with history of diabetes mellitus.  Patient advised per podiatry to present to the ED for IV antibiotics and possible surgical intervention.  Patient did state has completed a 10-day course of doxycycline and subsequently a 8-day course of Bactrim prior to admission.  Patient with a 2/10 pain in the right foot.  Patient with history of severe PAD.  Patient recently seen by ID, Dr.Manandhar in the outpatient setting on 08/22/2020.  We will check a sed rate, CRP.  Patient will be admitted and placed empirically on IV vancomycin and IV ciprofloxacin.  EDP spoke with Dr. Posey Pronto with podiatry who will see the patient in consultation and patient for surgical intervention hopefully in the next 24 hours.  We will make patient n.p.o. after midnight in anticipation of possible surgical intervention.  Patient will need ID consultation/input tomorrow.  May need input from vascular surgery as well.  Follow for now.  2.  End-stage renal disease on hemodialysis Monday Wednesday Friday Patient with end-stage renal disease on hemodialysis Monday Wednesday Friday being followed in [place].  Patient to be admitted to [place] in order to be able to get hemodialysis.  Will resume renal medications of Sensipar, Renvela.  Nephrology, Dr. Joelyn Oms consulted for hemodialysis.  3.  Insulin-dependent type 2 diabetes mellitus Per patient last hemoglobin A1c was approximately 6.7.  We will check a hemoglobin A1c.  Place on half home dose of  Levemir 5 units daily.  Sliding scale insulin.  Hold oral hypoglycemic agents.  Follow.  4.  Paroxysmal atrial fibrillation Currently stable.  Check a EKG.  Continue home regimen beta-blocker.  Patient noted to be on Eliquis for anticoagulation prior to admission which we will hold for now in  anticipation of surgery.  Will defer resumption of anticoagulation to podiatry.  5.  Coronary artery disease status post PCI/severe PAD Currently stable.  Asymptomatic.  Check a EKG.  Eliquis on hold in anticipation of possible surgical intervention.  Resume home regimen statin, beta-blocker.  6.  Hypertension Resume home regimen Toprol-XL.  7.  Hyperlipidemia Continue home regimen statin.  DVT prophylaxis: Heparin Code Status:   Full Family Communication:  Updated patient.  No family at bedside Disposition Plan:   Patient is from:  Home  Anticipated DC to:  Home  Anticipated DC date:  4 to 5 days  Anticipated DC barriers: Undetermined at this time.  Consults called:  Orthopedics: Dr. Lennette Bihari Patel/nephrology: Dr. Joelyn Oms Admission status:  Admit to [facility]inpatient/[facility]  Severity of Illness:     Irine Seal MD [facility]  How to contact the [facility] Attending or Consulting provider [place] or covering provider during after hours [place], for this patient?   1. Check the care team in [facility] and look for a) attending/consulting [facility] provider listed and b) the [facility] team listed 2. Log into [email or web address] and use [place]'s universal password to access. If you do not have the password, please contact the hospital operator. 3. Locate the [facility] provider you are looking for under [facility] and page to a number that you can be directly reached. 4. If you still have difficulty reaching the provider, please page the [facility] (Director on Call) for the Hospitalists listed on amion for assistance.  08/24/2020, 7:11 PM

## 2020-08-24 NOTE — Progress Notes (Signed)
Patient arrived on the floor from [facility] ED at 2055 via stretcher. Patient transferred to bed, skin check completed, and vital signs taken. Risk bracelets applied and admission assessment completed to the best of my ability. Will review orders and proceed with POC.

## 2020-08-24 NOTE — Progress Notes (Signed)
Pharmacy Antibiotic Note  Ralph Peterson is a 55 y.o. male with ESRD on HD MWF admitted on 08/24/2020 with R foot abscess osteomyelitis.  Pharmacy has been consulted for vancomycin dosing; plan for OR tomorrow for I&D and possible bone biopsy.  Plan: Vancomycin '20mg'$ /kg IV x 1 today Follow up dialysis plans for redosing    Temp (24hrs), Avg:98.6 F (37 C), Min:98.6 F (37 C), Max:98.6 F (37 C)  Recent Labs  Lab 08/22/20 1727 08/24/20 1500  WBC 6.0 6.0  CREATININE 5.44* 4.41*  LATICACIDVEN 1.2 1.9    Estimated Creatinine Clearance: 24 mL/min (A) (by C-G formula based on SCr of 4.41 mg/dL (H)).    Allergies  Allergen Reactions  . Oxycodone-Acetaminophen Itching  . Gabapentin Other (See Comments)    unknown  . Tramadol Other (See Comments)    unknown  . Penicillins Swelling and Rash    Has patient had a PCN reaction causing immediate rash, facial/tongue/throat swelling, SOB or lightheadedness with hypotension: Yes Has patient had a PCN reaction causing severe rash involving mucus membranes or skin necrosis: Yes Has patient had a PCN reaction that required hospitalization: No Has patient had a PCN reaction occurring within the last 10 years: No If all of the above answers are "NO", then may proceed with Cephalosporin use.     Thank you for allowing pharmacy to be a part of this patient's care.  Peggyann Juba, PharmD, BCPS Pharmacy: [phone] 08/24/2020 7:14 PM

## 2020-08-24 NOTE — ED Triage Notes (Signed)
Per pt-states he is scheduled to have surgery on right foot-Dr Posey Pronto told him to come to ED for IV antibiotics and to be admitted-Dr Patel's number [phone]

## 2020-08-24 NOTE — Progress Notes (Addendum)
COVID Vaccine Completed: Date COVID Vaccine completed: COVID vaccine manufacturer: Barry  Date of COVID positive in last 90 days:  PCP -  Cardiologist -   Chest x-ray - 03/07/20 in epic EKG - 03/07/20 in epic Stress Test - 09/02/19 care everywhere ECHO - 06/20/20 care everywhere,03/07/20 in epic  Cardiac Cath - GREATER THAN 2 YEARS Pacemaker/ICD device last checked:  Sleep Study -  01/06/20 CPAP -   Fasting Blood Sugar -  Checks Blood Sugar _____ times a day  Blood Thinner Instructions: Aspirin Instructions: Last Dose:  Activity level:  Unable to go up a flight of stairs without symptoms   Can go up a flight of stairs without stopping and without symptoms   Able to exercise without symptoms     Anesthesia review: CAD, ESRD, NSTEMI, A fib, OSA, CHF, HTN, DM, PAD  Patient denies shortness of breath, fever, cough and chest pain at PAT appointment   Patient verbalized understanding of instructions that were given to them at the PAT appointment. Patient was also instructed that they will need to review over the PAT instructions again at home before surgery.

## 2020-08-24 NOTE — Anesthesia Preprocedure Evaluation (Deleted)
Anesthesia Evaluation    Reviewed: Allergy & Precautions, Patient's Chart, lab work & pertinent test results  Airway        Dental   Pulmonary sleep apnea , former smoker,           Cardiovascular hypertension, + CAD and +CHF  + dysrhythmias Atrial Fibrillation      Neuro/Psych Anxiety    GI/Hepatic negative GI ROS, Neg liver ROS,   Endo/Other  diabetes  Renal/GU Dialysis and ESRFRenal diseaseMWF     Musculoskeletal negative musculoskeletal ROS (+)   Abdominal   Peds  Hematology   Anesthesia Other Findings   Reproductive/Obstetrics                             Anesthesia Physical Anesthesia Plan  ASA: IV  Anesthesia Plan: General   Post-op Pain Management:    Induction: Intravenous  PONV Risk Score and Plan: 3 and Treatment may vary due to age or medical condition, Ondansetron, Dexamethasone and Midazolam  Airway Management Planned: LMA  Additional Equipment: None  Intra-op Plan:   Post-operative Plan:   Informed Consent:     Dental advisory given  Plan Discussed with:   Anesthesia Plan Comments:         Anesthesia Quick Evaluation

## 2020-08-24 NOTE — ED Notes (Signed)
[facility] called for transport. 

## 2020-08-24 NOTE — ED Provider Notes (Signed)
[facility] DEPT Provider Note   CSN: [id number] Arrival date & time: 08/24/20  1415     History Chief Complaint  Patient presents with  . Foot Pain    Ralph Peterson is a 55 y.o. male with past medical history significant for A. Fib on eliquis, CAD s/p PCI, CHF, diabetes, ESRD, hypertension, dialysis MWF. Last took eliquis this morning.  HPI Patient presents to emergency room today with chief complaint of right foot pain x 1 month. Patient states he advised to come to the emergency department for IV antibiotics and and needing surgery.   He had an MRI right foot 08/22/20  with findings concerning for abscess and possible osteomyelitis of the right second metatarsal amputation site.  Patient states he initially went to the emergency department yesterday however sat in the lobby for approximately 14 hours and left to go home without being seen by provider. He is endorsing pain in his right foot that he describes as aching. He rates pain 2/10 in severity. No medications for symptoms prior to arrival. He denies fever and chills. Patient states when checking in to the ED today our staff called [facility] and was asked for Dr. Posey Pronto to be called.   Past Medical History:  Diagnosis Date  . Anxiety   . Arrhythmia    atrial fibrillation  . Atrial fibrillation ([place])   . CAD (coronary artery disease)    a. 09/2010 Cath/PCI ([facility]): LM nl, LAD 97m D1 80 (small), LCX 360mOM1 30, RI 70 (small), RCA 70 (DES).  . CHF (congestive heart failure) (HCMcNairy  . Coronary artery disease   . COVID-19 virus infection 07/2019  . Diabetes mellitus without complication (HCFulton  . ESRD (end stage renal disease) (HCDavenport  . Hyperlipidemia   . Hypertension   . Ischemic cardiomyopathy    a.  12/2011 Echo ([facility]): NL EF, mod LVH. Mild AS/MS, triv PR/TR. 07/2019 Echo: EF 25-30%, GR1 DD, inf/post HK, low nl RV fxn, mildly dil RA, triv MR, mild Ao sclerosis w/o stenosis.  .  NSTEMI (non-ST elevated myocardial infarction) (HCGeorgetown01/2021  . PAD (peripheral artery disease) (HCAsbury   a. 03/2016 s/p PTA/DEB R SFA/popliteal/peroneal; b. 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 08/2017 Atherectomy/DEB dist L SFA/popliteal. PTA of L AT; d. 06/2018 PTA/DEB L SFA/popliteal/PT/AT; e. 01/2019 Stable ABIs.  . Sleep apnea     Patient Active Problem List   Diagnosis Date Noted  . Atherosclerosis of native arteries of extremity with intermittent claudication (HCCloverleaf01/17/2022  . Cellulitis of left lower limb 04/19/2020  . 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 Pneumonia, unspecified organism 03/20/2020  . Mild aortic valve stenosis 03/16/2020  . Atrial fibrillation with RVR (HCKennewick08/18/2021  . Acute respiratory failure with hypoxia (HCMangonia Park08/17/2021  . Pressure injury of skin 03/07/2020  . Hypercalcemia 01/17/2020  . Acute on chronic systolic CHF (congestive heart failure), NYHA class 3 (HCEgeland01/28/2021  . COVID-19 07/27/2019  . Shortness of breath 07/27/2019  . Elevated troponin 07/27/2019  . Acute congestive heart failure (HCBourg  . Elevated lactic acid level   . Complication of vascular access for dialysis 07/30/2018  . Atherosclerosis of native arteries of the extremities with ulceration (HCLa Plata11/26/2019  . Dialysis patient (HCWest Pleasant View02/20/2018  . Cellulitis of right lower limb 05/29/2016  . Toe gangrene (HCWorthington Hills08/30/2017  .  Fluid overload, unspecified 08/15/2014  . Angina pectoris, unspecified ([place]) 06/25/2014  . Moderate protein-calorie malnutrition ([place]) 09/21/2013  . Encounter for immunization 01/09/2012  . Hyperkalemia 01/09/2012  . Iron deficiency anemia, unspecified 01/09/2012  . Other disorders of plasma-protein metabolism, not elsewhere classified 01/09/2012  . Anemia in chronic kidney disease 01/03/2012  . Chest pain, unspecified 01/03/2012  . Coagulation  defect, unspecified ([place]) 01/03/2012  . Malformation of coronary vessels 01/03/2012  . Pain, unspecified 01/03/2012  . Pruritus, unspecified 01/03/2012  . Secondary hyperparathyroidism of renal origin ([place]) 01/03/2012  . Thrombocytopenia, unspecified ([place]) 01/03/2012  . Type 2 diabetes mellitus without complications ([place]) [id number]  . CAD (coronary artery disease) 11/07/2011  . Diabetes mellitus ([place]) 11/07/2011  . ESRD (end stage renal disease) ([place]) 11/07/2011  . Hyperlipidemia, unspecified 11/07/2011  . Hypertension 11/07/2011  . Stented coronary artery 11/07/2011    Past Surgical History:  Procedure Laterality Date  . ABDOMINAL AORTOGRAM N/A 09/10/2017   Procedure: ABDOMINAL AORTOGRAM;  Surgeon: Wellington Hampshire, MD;  Location: [facility];  Service: Cardiovascular;  Laterality: N/A;  . AMPUTATION TOE Bilateral    one toe on right, all five toes on the left  . CARDIAC CATHETERIZATION  2012   [facility]; X1 stent 2.5 x 33 mm xience stent Distal TCA  . LOWER EXTREMITY ANGIOGRAPHY Bilateral 09/10/2017   Procedure: Lower Extremity Angiography;  Surgeon: Wellington Hampshire, MD;  Location: [facility];  Service: Cardiovascular;  Laterality: Bilateral;  . LOWER EXTREMITY ANGIOGRAPHY Left 06/23/2018   Procedure: LOWER EXTREMITY ANGIOGRAPHY;  Surgeon: Katha Cabal, MD;  Location: [facility];  Service: Cardiovascular;  Laterality: Left;  . LOWER EXTREMITY ANGIOGRAPHY Right 02/01/2020   Procedure: LOWER EXTREMITY ANGIOGRAPHY;  Surgeon: Katha Cabal, MD;  Location: [facility];  Service: Cardiovascular;  Laterality: Right;  . PERIPHERAL VASCULAR ATHERECTOMY Left 09/10/2017   Procedure: PERIPHERAL VASCULAR ATHERECTOMY;  Surgeon: Wellington Hampshire, MD;  Location: [facility];  Service: Cardiovascular;  Laterality: Left;  SFA/POPLITEAL  . PERIPHERAL VASCULAR BALLOON ANGIOPLASTY Left 09/10/2017   Procedure: PERIPHERAL VASCULAR BALLOON  ANGIOPLASTY;  Surgeon: Wellington Hampshire, MD;  Location: [facility];  Service: Cardiovascular;  Laterality: Left;  ANTERIAL TIBIAL  . PERIPHERAL VASCULAR CATHETERIZATION Right 03/26/2016   Procedure: Lower Extremity Angiography;  Surgeon: Katha Cabal, MD;  Location: [facility];  Service: Cardiovascular;  Laterality: Right;  . PERIPHERAL VASCULAR INTERVENTION Left 09/10/2017   Procedure: PERIPHERAL VASCULAR INTERVENTION;  Surgeon: Wellington Hampshire, MD;  Location: [facility];  Service: Cardiovascular;  Laterality: Left;  SFA/POPLITEAL       Family History  Problem Relation Age of Onset  . Hypertension Mother     Social History   Tobacco Use  . Smoking status: Former Smoker    Types: Cigarettes    Quit date: 2010    Years since quitting: 12.0  . Smokeless tobacco: Never Used  Vaping Use  . Vaping Use: Never used  Substance Use Topics  . Alcohol use: Not Currently  . Drug use: No    Home Medications Prior to Admission medications   Medication Sig Start Date End Date Taking? Authorizing Provider  albuterol (VENTOLIN HFA) 108 (90 Base) MCG/ACT inhaler Inhale 2 puffs into the lungs every 6 (six) hours as needed for wheezing or shortness of breath. 08/01/20 08/01/21  [provider]  apixaban (ELIQUIS) 5 MG TABS tablet Take 1 tablet (5 mg total)  by mouth 2 (two) times daily. 03/10/20 04/18/20  ShahmehdiValeria Batman, MD  atorvastatin (LIPITOR) 40 MG tablet Take 40 mg by mouth at bedtime. 03/31/18   [provider]  B Complex-C-Zn-Folic Acid (DIALYVITE AB-123456789 15 PO) Take 1 tablet by mouth daily.    [provider]  cinacalcet (SENSIPAR) 30 MG tablet Take 30 mg by mouth at bedtime.    [provider]  escitalopram (LEXAPRO) 10 MG tablet Take 10 mg by mouth daily.    [provider]  fluticasone (FLONASE) 50 MCG/ACT nasal spray Place 2 sprays into both nostrils 2 (two) times daily. 08/01/20   [provider]   glipiZIDE (GLUCOTROL) 5 MG tablet Take 5 mg by mouth daily before breakfast.  01/01/19   [provider]  insulin detemir (LEVEMIR) 100 UNIT/ML injection Inject 0.05 mLs (5 Units total) into the skin 2 (two) times daily. 03/10/20 04/18/20  ShahmehdiValeria Batman, MD  loperamide (IMODIUM) 2 MG capsule Take 2 mg by mouth as needed for diarrhea or loose stools.    [provider]  metoprolol succinate (TOPROL-XL) 50 MG 24 hr tablet Take 50 mg by mouth 2 (two) times daily. 03/25/20   [provider]  mirtazapine (REMERON) 7.5 MG tablet Take 7.5 mg by mouth at bedtime. 07/28/20   [provider]  montelukast (SINGULAIR) 10 MG tablet Take 10 mg by mouth at bedtime. 02/17/20   [provider]  omeprazole (PRILOSEC) 20 MG capsule Take 20 mg by mouth at bedtime.    [provider]  sevelamer carbonate (RENVELA) 800 MG tablet Take 1,600-3,200 mg by mouth See admin instructions. Take 4 tablets ('3200mg'$ ) three times daily with meals and take 1 tablet ('800mg'$ ) by mouth with snacks    [provider]  sulfamethoxazole-trimethoprim (BACTRIM DS) 800-160 MG tablet Take 1 tablet by mouth 2 (two) times daily. 08/11/20   [provider]    Allergies    Oxycodone-acetaminophen, Gabapentin, Tramadol, and Penicillins  Review of Systems   Review of Systems All other systems are reviewed and are negative for acute change except as noted in the HPI.  Physical Exam Updated Vital Signs BP 106/71 (BP Location: Left Arm)   Pulse 77   Temp 98.6 F (37 C) (Oral)   Resp 18   SpO2 98%   Physical Exam Vitals and nursing note reviewed.  Constitutional:      General: He is not in acute distress.    Appearance: He is not ill-appearing.  HENT:     Head: Normocephalic and atraumatic.     Right Ear: Tympanic membrane and external ear normal.     Left Ear: Tympanic membrane and external ear normal.     Nose: Nose normal.     Mouth/Throat:     Mouth: Mucous  membranes are moist.     Pharynx: Oropharynx is clear.  Eyes:     General: No scleral icterus.       Right eye: No discharge.        Left eye: No discharge.     Extraocular Movements: Extraocular movements intact.     Conjunctiva/sclera: Conjunctivae normal.     Pupils: Pupils are equal, round, and reactive to light.  Neck:     Vascular: No JVD.  Cardiovascular:     Rate and Rhythm: Normal rate and regular rhythm.     Pulses: Normal pulses.          Radial pulses are 2+ on the right side and  2+ on the left side.     Heart sounds: Normal heart sounds.  Pulmonary:     Comments: Lungs clear to auscultation in all fields. Symmetric chest rise. No wheezing, rales, or rhonchi. Abdominal:     Comments: Abdomen is soft, non-distended, and non-tender in all quadrants. No rigidity, no guarding. No peritoneal signs.  Musculoskeletal:        General: Normal range of motion.     Cervical back: Normal range of motion.  Feet:     Comments: Please see media below. Skin:    General: Skin is warm and dry.     Capillary Refill: Capillary refill takes less than 2 seconds.  Neurological:     Mental Status: He is oriented to person, place, and time.     GCS: GCS eye subscore is 4. GCS verbal subscore is 5. GCS motor subscore is 6.     Comments: Fluent speech, no facial droop.  Psychiatric:        Behavior: Behavior normal.           ED Results / Procedures / Treatments   Labs (all labs ordered are listed, but only abnormal results are displayed) Labs Reviewed  COMPREHENSIVE METABOLIC PANEL - Abnormal; Notable for the following components:      Result Value   Chloride 93 (*)    Glucose, Bld 140 (*)    BUN 33 (*)    Creatinine, Ser 4.41 (*)    Albumin 3.2 (*)    GFR, Estimated 15 (*)    All other components within normal limits  CBC WITH DIFFERENTIAL/PLATELET - Abnormal; Notable for the following components:   RBC 4.07 (*)    Hemoglobin 12.5 (*)    RDW 19.3 (*)    Platelets 142  (*)    All other components within normal limits  SARS CORONAVIRUS 2 BY RT PCR ([facility])  LACTIC ACID, PLASMA  LACTIC ACID, PLASMA    EKG None  Radiology No results found.  Procedures Procedures   Medications Ordered in ED Medications  ciprofloxacin (CIPRO) IVPB 400 mg (400 mg Intravenous New Bag/Given 08/24/20 1745)  clindamycin (CLEOCIN) IVPB 600 mg (0 mg Intravenous Stopped 08/24/20 1725)    ED Course  I have reviewed the triage vital signs and the nursing notes.  Pertinent labs & imaging results that were available during my care of the patient were reviewed by me and considered in my medical decision making (see chart for details).    MDM Rules/Calculators/A&P                          History provided by patient with additional history obtained from chart review.    55 yo male presenting with right foot abscess and possible osteomyelitis. Patient is followed by Dr. Posey Pronto at [facility]. Had outpatient imaging which I viewed.  An xray was ordered in triage yesterday when patient initially presented to [facility] ED and shows lucency overlying the expected location of the head of the second metatarsal which is concisten with abscess seen on MRI. Screening labs obtained. CBC without leukocytosis, hemoglobin 12.5 consistent with baseline and thombocytopenia with platelet count of 142. CMP shows BUN/Cr also consistent with baseline. Lactic acid within normal range. Patient has penicillin allergy therefore cipro and clindamycin ordered. Covid test in process. Consulted Dr. Posey Pronto who is recommending medical admission with plan to take patient to the OR tomorrow  for irrigation and debridement for the abscess possible bone biopsy.  Order has been placed to have patient be n.p.o. after midnight. Spoke with Dr. Stephens Shire with hospitalist service who agrees to assume care of patient and bring into the hospital for further evaluation and  management.  Patient will need to be admitted to [facility] as he is a dialysis patient due to dialyze tomorrow. Dr. Posey Pronto working on getting OR time scheduled at [facility] tomorrow and will cancel procedure here at [facility]. If unable to get OR time tomorrow patient will still need admission for IV antibiotics per Dr. Posey Pronto and he will attempt to get OR time over the weekend.   Portions of this note were generated with [employer]obby[employer]. Dictation errors may occur despite best attempts at proofreading.    Final Clinical Impression(s) / ED Diagnoses Final diagnoses:  Diabetic foot infection [facility])    Rx / DC Orders ED Discharge Orders    None       Lewanda Rife 08/24/20 1836    Dorie Rank, MD 08/27/20 [phone]

## 2020-08-25 ENCOUNTER — Inpatient Hospital Stay (HOSPITAL_COMMUNITY): Payer: Medicare PPO | Admitting: Anesthesiology

## 2020-08-25 ENCOUNTER — Inpatient Hospital Stay (HOSPITAL_COMMUNITY): Admission: RE | Admit: 2020-08-25 | Payer: Medicare PPO | Source: Home / Self Care | Admitting: Podiatry

## 2020-08-25 ENCOUNTER — Encounter (HOSPITAL_COMMUNITY): Admission: RE | Payer: Self-pay | Source: Home / Self Care

## 2020-08-25 ENCOUNTER — Encounter (HOSPITAL_COMMUNITY)
Admission: EM | Disposition: A | Discharge status: Home / Self Care | Payer: Self-pay | Source: Home / Self Care | Attending: Internal Medicine

## 2020-08-25 ENCOUNTER — Encounter (HOSPITAL_COMMUNITY): Payer: Self-pay | Admitting: Internal Medicine

## 2020-08-25 DIAGNOSIS — I4891 Unspecified atrial fibrillation: Secondary | ICD-10-CM | POA: Diagnosis not present

## 2020-08-25 DIAGNOSIS — L02611 Cutaneous abscess of right foot: Secondary | ICD-10-CM | POA: Diagnosis not present

## 2020-08-25 DIAGNOSIS — M86671 Other chronic osteomyelitis, right ankle and foot: Secondary | ICD-10-CM | POA: Diagnosis not present

## 2020-08-25 DIAGNOSIS — L02619 Cutaneous abscess of unspecified foot: Secondary | ICD-10-CM | POA: Diagnosis not present

## 2020-08-25 DIAGNOSIS — I70213 Atherosclerosis of native arteries of extremities with intermittent claudication, bilateral legs: Secondary | ICD-10-CM | POA: Diagnosis not present

## 2020-08-25 HISTORY — DX: Unspecified atrial fibrillation: I48.91

## 2020-08-25 HISTORY — PX: I & D EXTREMITY: SHX5045

## 2020-08-25 LAB — CBC WITH DIFFERENTIAL/PLATELET
Abs Immature Granulocytes: 0.03 10*3/uL (ref 0.00–0.07)
Basophils Absolute: 0.1 10*3/uL (ref 0.0–0.1)
Basophils Relative: 1 %
Eosinophils Absolute: 0.3 10*3/uL (ref 0.0–0.5)
Eosinophils Relative: 4 %
HCT: 35.4 % — ABNORMAL LOW (ref 39.0–52.0)
Hemoglobin: 11.3 g/dL — ABNORMAL LOW (ref 13.0–17.0)
Immature Granulocytes: 1 %
Lymphocytes Relative: 22 %
Lymphs Abs: 1.4 10*3/uL (ref 0.7–4.0)
MCH: 30.4 pg (ref 26.0–34.0)
MCHC: 31.9 g/dL (ref 30.0–36.0)
MCV: 95.2 fL (ref 80.0–100.0)
Monocytes Absolute: 0.5 10*3/uL (ref 0.1–1.0)
Monocytes Relative: 8 %
Neutro Abs: 3.9 10*3/uL (ref 1.7–7.7)
Neutrophils Relative %: 64 %
Platelets: 129 10*3/uL — ABNORMAL LOW (ref 150–400)
RBC: 3.72 MIL/uL — ABNORMAL LOW (ref 4.22–5.81)
RDW: 19.3 % — ABNORMAL HIGH (ref 11.5–15.5)
WBC: 6.2 10*3/uL (ref 4.0–10.5)
nRBC: 0 % (ref 0.0–0.2)

## 2020-08-25 LAB — GLUCOSE, CAPILLARY
Glucose-Capillary: 92 mg/dL (ref 70–99)
Glucose-Capillary: 55 mg/dL — ABNORMAL LOW (ref 70–99)
Glucose-Capillary: 155 mg/dL — ABNORMAL HIGH (ref 70–99)
Glucose-Capillary: 75 mg/dL (ref 70–99)
Glucose-Capillary: 68 mg/dL — ABNORMAL LOW (ref 70–99)
Glucose-Capillary: 159 mg/dL — ABNORMAL HIGH (ref 70–99)

## 2020-08-25 LAB — RENAL FUNCTION PANEL
Albumin: 2.7 g/dL — ABNORMAL LOW (ref 3.5–5.0)
Anion gap: 13 (ref 5–15)
BUN: 37 mg/dL — ABNORMAL HIGH (ref 6–20)
CO2: 27 mmol/L (ref 22–32)
Calcium: 9.2 mg/dL (ref 8.9–10.3)
Chloride: 94 mmol/L — ABNORMAL LOW (ref 98–111)
Creatinine, Ser: 5.58 mg/dL — ABNORMAL HIGH (ref 0.61–1.24)
GFR, Estimated: 12 mL/min — ABNORMAL LOW (ref >60)
Glucose, Bld: 117 mg/dL — ABNORMAL HIGH (ref 70–99)
Phosphorus: 4.1 mg/dL (ref 2.5–4.6)
Potassium: 5 mmol/L (ref 3.5–5.1)
Sodium: 134 mmol/L — ABNORMAL LOW (ref 135–145)

## 2020-08-25 LAB — POCT I-STAT, CHEM 8
BUN: 39 mg/dL — ABNORMAL HIGH (ref 6–20)
Calcium, Ion: 1.12 mmol/L — ABNORMAL LOW (ref 1.15–1.40)
Chloride: 94 mmol/L — ABNORMAL LOW (ref 98–111)
Creatinine, Ser: 5.7 mg/dL — ABNORMAL HIGH (ref 0.61–1.24)
Glucose, Bld: 129 mg/dL — ABNORMAL HIGH (ref 70–99)
HCT: 38 % — ABNORMAL LOW (ref 39.0–52.0)
Hemoglobin: 12.9 g/dL — ABNORMAL LOW (ref 13.0–17.0)
Potassium: 5.3 mmol/L — ABNORMAL HIGH (ref 3.5–5.1)
Sodium: 131 mmol/L — ABNORMAL LOW (ref 135–145)
TCO2: 27 mmol/L (ref 22–32)

## 2020-08-25 LAB — HEMOGLOBIN A1C
Hgb A1c MFr Bld: 6.7 % — ABNORMAL HIGH (ref 4.8–5.6)
Mean Plasma Glucose: 145.59 mg/dL

## 2020-08-25 LAB — SURGICAL PCR SCREEN
MRSA, PCR: NEGATIVE
Staphylococcus aureus: NEGATIVE

## 2020-08-25 SURGERY — IRRIGATION AND DEBRIDEMENT ABSCESS
Anesthesia: General | Laterality: Right

## 2020-08-25 SURGERY — IRRIGATION AND DEBRIDEMENT FOOT
Anesthesia: General | Laterality: Right

## 2020-08-25 SURGERY — IRRIGATION AND DEBRIDEMENT EXTREMITY
Anesthesia: Monitor Anesthesia Care | Site: Foot | Laterality: Right

## 2020-08-25 MED ORDER — MIDAZOLAM HCL 2 MG/2ML IJ SOLN
INTRAMUSCULAR | Status: AC
  Filled 2020-08-25: qty 2

## 2020-08-25 MED ORDER — SODIUM CHLORIDE 0.9 % IV SOLN: INTRAVENOUS | Status: DC

## 2020-08-25 MED ORDER — CHLORHEXIDINE GLUCONATE 0.12 % MT SOLN
15.0000 mL | OROMUCOSAL | Status: AC
  Filled 2020-08-25: qty 15

## 2020-08-25 MED ORDER — CHLORHEXIDINE GLUCONATE 0.12 % MT SOLN
OROMUCOSAL | Status: AC
  Administered 2020-08-25: 15 mL via OROMUCOSAL
  Filled 2020-08-25: qty 15

## 2020-08-25 MED ORDER — EPHEDRINE SULFATE-NACL 50-0.9 MG/10ML-% IV SOSY
PREFILLED_SYRINGE | INTRAVENOUS | Status: DC | PRN
  Administered 2020-08-25: 10 mg via INTRAVENOUS

## 2020-08-25 MED ORDER — CHLORHEXIDINE GLUCONATE CLOTH 2 % EX PADS
6.0000 | MEDICATED_PAD | Freq: Every day | CUTANEOUS | Status: DC
  Administered 2020-08-26: 6 via TOPICAL

## 2020-08-25 MED ORDER — BUPIVACAINE HCL 0.25 % IJ SOLN
INTRAMUSCULAR | Status: DC | PRN
  Administered 2020-08-25: 20 mL

## 2020-08-25 MED ORDER — BUPIVACAINE HCL (PF) 0.25 % IJ SOLN
INTRAMUSCULAR | Status: AC
  Filled 2020-08-25: qty 30

## 2020-08-25 MED ORDER — PROPOFOL 10 MG/ML IV BOLUS
INTRAVENOUS | Status: AC
  Filled 2020-08-25: qty 20

## 2020-08-25 MED ORDER — MUPIROCIN 2 % EX OINT
1.0000 "application " | TOPICAL_OINTMENT | Freq: Two times a day (BID) | CUTANEOUS | Status: DC
  Administered 2020-08-25 – 2020-08-29 (×9): 1 via NASAL
  Filled 2020-08-25 (×2): qty 22

## 2020-08-25 MED ORDER — DEXTROSE 50 % IV SOLN
12.5000 g | Freq: Once | INTRAVENOUS | Status: AC
  Administered 2020-08-25: 12.5 g via INTRAVENOUS

## 2020-08-25 MED ORDER — MIDAZOLAM HCL 2 MG/2ML IJ SOLN
INTRAMUSCULAR | Status: DC | PRN
  Administered 2020-08-25: 2 mg via INTRAVENOUS

## 2020-08-25 MED ORDER — FENTANYL CITRATE (PF) 250 MCG/5ML IJ SOLN
INTRAMUSCULAR | Status: DC | PRN
  Administered 2020-08-25 (×2): 50 ug via INTRAVENOUS

## 2020-08-25 MED ORDER — LIDOCAINE HCL 1 % IJ SOLN
INTRAMUSCULAR | Status: AC
  Filled 2020-08-25: qty 20

## 2020-08-25 MED ORDER — DEXTROSE 50 % IV SOLN
25.0000 g | Freq: Once | INTRAVENOUS | Status: AC
  Administered 2020-08-25: 25 g via INTRAVENOUS
  Filled 2020-08-25: qty 50

## 2020-08-25 MED ORDER — BUPIVACAINE HCL (PF) 0.25 % IJ SOLN
INTRAMUSCULAR | Status: AC
  Filled 2020-08-25: qty 20

## 2020-08-25 MED ORDER — LIDOCAINE 1 % OPTIME INJ - NO CHARGE
INTRAMUSCULAR | Status: DC | PRN
  Administered 2020-08-25: 5 mL

## 2020-08-25 MED ORDER — FENTANYL CITRATE (PF) 250 MCG/5ML IJ SOLN
INTRAMUSCULAR | Status: AC
  Filled 2020-08-25: qty 5

## 2020-08-25 MED ORDER — PROPOFOL 10 MG/ML IV BOLUS
INTRAVENOUS | Status: DC | PRN
  Administered 2020-08-25: 20 mg via INTRAVENOUS

## 2020-08-25 SURGICAL SUPPLY — 43 items
BANDAGE ESMARK 6X9 LF (GAUZE/BANDAGES/DRESSINGS) IMPLANT
BLADE AVERAGE 25X9 (BLADE) IMPLANT
BNDG ELASTIC 3X5.8 VLCR STR LF (GAUZE/BANDAGES/DRESSINGS) IMPLANT
BNDG ELASTIC 4X5.8 VLCR STR LF (GAUZE/BANDAGES/DRESSINGS) ×2 IMPLANT
BNDG ESMARK 6X9 LF (GAUZE/BANDAGES/DRESSINGS)
BNDG GAUZE ELAST 4 BULKY (GAUZE/BANDAGES/DRESSINGS) ×2 IMPLANT
CHLORAPREP W/TINT 26 (MISCELLANEOUS) ×2 IMPLANT
CUFF TOURN SGL QUICK 34 (TOURNIQUET CUFF)
CUFF TRNQT CYL 34X4.125X (TOURNIQUET CUFF) IMPLANT
DRAPE OEC MINIVIEW 54X84 (DRAPES) IMPLANT
DRAPE SURG 17X23 STRL (DRAPES) IMPLANT
DRSG EMULSION OIL 3X3 NADH (GAUZE/BANDAGES/DRESSINGS) IMPLANT
ELECT REM PT RETURN 9FT ADLT (ELECTROSURGICAL) ×2
ELECTRODE REM PT RTRN 9FT ADLT (ELECTROSURGICAL) ×1 IMPLANT
GAUZE SPONGE 4X4 12PLY STRL (GAUZE/BANDAGES/DRESSINGS) ×2 IMPLANT
GLOVE BIO SURGEON STRL SZ7 (GLOVE) ×2 IMPLANT
GLOVE BIOGEL PI IND STRL 7.5 (GLOVE) ×1 IMPLANT
GLOVE BIOGEL PI INDICATOR 7.5 (GLOVE) ×1
GOWN STRL REUS W/ TWL LRG LVL3 (GOWN DISPOSABLE) ×1 IMPLANT
GOWN STRL REUS W/ TWL XL LVL3 (GOWN DISPOSABLE) ×1 IMPLANT
GOWN STRL REUS W/TWL LRG LVL3 (GOWN DISPOSABLE) ×1
GOWN STRL REUS W/TWL XL LVL3 (GOWN DISPOSABLE) ×1
HANDPIECE INTERPULSE COAX TIP (DISPOSABLE) ×1
KIT BASIN OR (CUSTOM PROCEDURE TRAY) ×2 IMPLANT
NDL SAFETY ECLIPSE 18X1.5 (NEEDLE) IMPLANT
NEEDLE HYPO 18GX1.5 SHARP (NEEDLE)
NEEDLE HYPO 25X1 1.5 SAFETY (NEEDLE) ×2 IMPLANT
NS IRRIG 1000ML POUR BTL (IV SOLUTION) IMPLANT
PACK ORTHO EXTREMITY (CUSTOM PROCEDURE TRAY) ×2 IMPLANT
PADDING CAST ABS 4INX4YD NS (CAST SUPPLIES)
PADDING CAST ABS COTTON 4X4 ST (CAST SUPPLIES) IMPLANT
SET HNDPC FAN SPRY TIP SCT (DISPOSABLE) ×1 IMPLANT
STAPLER VISISTAT 35W (STAPLE) ×2 IMPLANT
STOCKINETTE 6  STRL (DRAPES)
STOCKINETTE 6 STRL (DRAPES) IMPLANT
SUT MNCRL AB 3-0 PS2 18 (SUTURE) IMPLANT
SUT MNCRL AB 4-0 PS2 18 (SUTURE) IMPLANT
SUT MON AB 5-0 PS2 18 (SUTURE) IMPLANT
SUT PROLENE 3 0 PS 2 (SUTURE) ×2 IMPLANT
SUT PROLENE 4 0 PS 2 18 (SUTURE) IMPLANT
SYR CONTROL 10ML LL (SYRINGE) IMPLANT
UNDERPAD 30X36 HEAVY ABSORB (UNDERPADS AND DIAPERS) ×2 IMPLANT
YANKAUER SUCT BULB TIP NO VENT (SUCTIONS) ×2 IMPLANT

## 2020-08-25 NOTE — Progress Notes (Signed)
OT Cancellation Note  Patient Details Name: Ralph Peterson MRN: [id number] DOB: [birth year]   Cancelled Treatment:    Reason Eval/Treat Not Completed: Patient at procedure or test/ unavailable (Pt in surgery. Will follow.)  [name] 08/25/2020, 12:11 PM  Nestor Lewandowsky, OTR/L Acute Rehabilitation Services Pager: [phone] Office: [phone]

## 2020-08-25 NOTE — Interval H&P Note (Signed)
History and Physical Interval Note:  08/25/2020 12:16 PM  Ralph Peterson  has presented today for surgery, with the diagnosis of infection right foot.  The various methods of treatment have been discussed with the patient and family. After consideration of risks, benefits and other options for treatment, the patient has consented to  Procedure(s): IRRIGATION AND DEBRIDEMENT OF RIGHT FOOT WITH BONE BIOPSY (Right) as a surgical intervention.  The patient's history has been reviewed, patient examined, no change in status, stable for surgery.  I have reviewed the patient's chart and labs.  Questions were answered to the patient's satisfaction.     Felipa Furnace

## 2020-08-25 NOTE — Anesthesia Postprocedure Evaluation (Signed)
Anesthesia Post Note  Patient: Ralph Peterson  Procedure(s) Performed: IRRIGATION AND DEBRIDEMENT OF RIGHT FOOT (Right Foot)     Patient location during evaluation: PACU Anesthesia Type: MAC Level of consciousness: awake and alert Pain management: pain level controlled Vital Signs Assessment: post-procedure vital signs reviewed and stable Respiratory status: spontaneous breathing, nonlabored ventilation and respiratory function stable Cardiovascular status: stable and blood pressure returned to baseline Anesthetic complications: no   No complications documented.  Last Vitals:  Vitals:   08/25/20 1403 08/25/20 1500  BP: 105/71 92/71  Pulse: (!) 58 70  Resp: (!) 22 20  Temp: 37.2 C 37 C  SpO2: 97% 98%    Last Pain:  Vitals:   08/25/20 1500  TempSrc: Oral  PainSc:                  Audry Pili

## 2020-08-25 NOTE — Progress Notes (Signed)
Rodell Perna, RN called and made aware the Pt's HD tx has been moved to 08/26/2020.

## 2020-08-25 NOTE — Progress Notes (Signed)
Received a call from HD unit that  Pt  HD tx would be tomorrow 07/26/20

## 2020-08-25 NOTE — Progress Notes (Signed)
Pt is NPO for scheduled surgery this am.OR transporter ready to get pt for surgery. CBG checked and found 55, D50 given as ordered. Pt remains alert/oriented in no acute distress. Denies any dizziness or discomfort at this time. No complaints voiced.Report given to short stay,and per short stay staff, it's ok to  Bring pt downstairs and they will be responsible to recheck pt's blood sugar.

## 2020-08-25 NOTE — Anesthesia Preprocedure Evaluation (Addendum)
Anesthesia Evaluation  Patient identified by MRN, date of birth, ID band Patient awake    Reviewed: Allergy & Precautions, NPO status , Patient's Chart, lab work & pertinent test results  History of Anesthesia Complications (+) MALIGNANT HYPERTHERMIANegative for: history of anesthetic complications  Airway Mallampati: II  TM Distance: >3 FB Neck ROM: Full    Dental  (+) Dental Advisory Given, Chipped   Pulmonary sleep apnea , former smoker,    Pulmonary exam normal        Cardiovascular hypertension, Pt. on home beta blockers and Pt. on medications + CAD, + Past MI, + Peripheral Vascular Disease and +CHF  + dysrhythmias Atrial Fibrillation + Valvular Problems/Murmurs MR  Rhythm:Regular Rate:Normal + Systolic murmurs  '21 TTE - EF <20%. The left ventricle has severely decreased function. The left ventricle demonstrates  global hypokinesis. The left ventricular internal cavity size was moderately to severely dilated. Grade II diastolic  dysfunction (pseudonormalization). Right ventricular systolic function is severely reduced. The right  ventricular size is moderately enlarged. Moderately increased right ventricular wall thickness. There is severely elevated pulmonary artery systolic pressure. LA and RA were moderately dilated. Moderate mitral valve regurgitation. Tricuspid valve regurgitation is moderate to severe. Mild aortic valve stenosis.     Neuro/Psych PSYCHIATRIC DISORDERS Anxiety negative neurological ROS     GI/Hepatic negative GI ROS, Neg liver ROS,   Endo/Other  diabetes, Type 2, Oral Hypoglycemic Agents Obesity   Renal/GU ESRF and DialysisRenal disease     Musculoskeletal negative musculoskeletal ROS (+)   Abdominal   Peds  Hematology  (+) anemia ,  Thrombocytopenia, Plt 129k    Anesthesia Other Findings Covid test negative Covid+ 07/2019   Reproductive/Obstetrics                             Anesthesia Physical Anesthesia Plan  ASA: IV  Anesthesia Plan: MAC   Post-op Pain Management:    Induction: Intravenous  PONV Risk Score and Plan: 1 and Propofol infusion, Treatment may vary due to age or medical condition and Midazolam  Airway Management Planned: Natural Airway and Simple Face Mask  Additional Equipment: None  Intra-op Plan:   Post-operative Plan:   Informed Consent: I have reviewed the patients History and Physical, chart, labs and discussed the procedure including the risks, benefits and alternatives for the proposed anesthesia with the patient or authorized representative who has indicated his/her understanding and acceptance.       Plan Discussed with: CRNA and Anesthesiologist  Anesthesia Plan Comments:        Anesthesia Quick Evaluation

## 2020-08-25 NOTE — Progress Notes (Addendum)
PROGRESS NOTE    Ralph Peterson  [id number] DOB: 05-31-1966 DOA: 08/24/2020 PCP: Vidal Schwalbe, MD   Brief Narrative: Ralph Peterson is a 55 y.o. 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 Patient presented secondary to foot pain with evidence of post-op wound infection.   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 Overall patient is stable. Possible osteomyelitis of 2nd digit amputation site. -Podiatry recommendations: surgery -Infectious disease consult pending surgical management  ESRD on HD MWF schedule. Nephrology consulted on admission.  Diabetes mellitus, type  2 -Continue Levemir and SSI  Paroxysmal atrial fibrillation On metoprolol and Eliquis -Continue Toprol XL -Hold Eliquis secondary to planned surgical management  CAD Hyperlipidemia Patient is s/p PCI. -Continue Lipitor  Severe PAD Patient follows with vascular surgery.   Primary hypertension -Continue metoprolol   DVT prophylaxis: SCDs Code Status:   Code Status: Full Code Family Communication: None at bedside Disposition Plan: Discharge home in 1-2 days   Consultants:   Podiatry  Procedures:   None  Antimicrobials:  Vancomycin  Ciprofloxacin    Subjective: No concerns today.  Objective: Vitals:   08/24/20 2134 08/25/20 0110 08/25/20 0500 08/25/20 0617  BP:  97/72  120/74  Pulse:  69  65  Resp:      Temp:  98 F (36.7 C)  98.4 F (36.9 C)  TempSrc:  Oral  Oral  SpO2:  97%  98%  Weight: 104.2 kg  104.3 kg   Height: 6' (1.829 m)       Intake/Output Summary (Last 24 hours) at 08/25/2020 0727 Last data filed at 08/24/2020 1900 Gross per 24 hour  Intake 250 ml  Output -  Net 250 ml   Filed Weights   08/24/20 2134 08/25/20 0500  Weight: 104.2 kg 104.3 kg    Examination:  General exam: Appears calm and comfortable Respiratory system: Clear to auscultation. Respiratory effort normal. Cardiovascular system: S1 & S2 heard, RRR. No murmurs, rubs, gallops or clicks. 1+ DP pulse on right Gastrointestinal system: Abdomen is nondistended, soft and nontender. No organomegaly or masses felt. Normal bowel sounds heard. Central nervous system: Alert and oriented. No focal neurological deficits. Musculoskeletal: No edema. No calf tenderness. Left transmetatarsal amputation. Right foot wrapped in gauze. Skin: No cyanosis. No rashes Psychiatry: Judgement and insight appear normal.  Mood & affect appropriate.     Data Reviewed: I have personally reviewed following labs and imaging studies  CBC Lab Results  Component Value Date   WBC 6.2 08/25/2020   RBC  3.72 (L) 08/25/2020   HGB 11.3 (L) 08/25/2020   HCT 35.4 (L) 08/25/2020   MCV 95.2 08/25/2020   MCH 30.4 08/25/2020   PLT 129 (L) 08/25/2020   MCHC 31.9 08/25/2020   RDW 19.3 (H) 08/25/2020   LYMPHSABS 1.4 08/25/2020   MONOABS 0.5 08/25/2020   EOSABS 0.3 08/25/2020   BASOSABS 0.1 [id number]     Last metabolic panel Lab Results  Component Value Date   NA 134 (L) 08/25/2020   K 5.0 08/25/2020   CL 94 (L) 08/25/2020   CO2 27 08/25/2020   BUN 37 (H) 08/25/2020   CREATININE 5.58 (H) 08/25/2020   GLUCOSE 117 (H) 08/25/2020   GFRNONAA 12 (L) 08/25/2020   GFRAA 10 (L) 03/10/2020   CALCIUM 9.2 08/25/2020   PHOS 4.1 08/25/2020   PROT 6.9 08/24/2020   ALBUMIN 2.7 (L) 08/25/2020   LABGLOB 2.7 03/19/2016   AGRATIO 1.5 03/19/2016   BILITOT 1.0 08/24/2020   ALKPHOS 109 08/24/2020   AST 20 08/24/2020   ALT 13 08/24/2020   ANIONGAP 13 08/25/2020    CBG (last 3)  Recent Labs    08/24/20 2310 08/25/20 0614  GLUCAP 108* 92     GFR: Estimated Creatinine Clearance: 19.3 mL/min (A) (by C-G formula based on SCr of 5.58 mg/dL (H)).  Coagulation Profile: No results for input(s): INR, PROTIME in the last 168 hours.  Recent Results (from the past 240 hour(s))  SARS Coronavirus 2 by RT PCR (hospital order, performed in [facility] hospital lab) Nasopharyngeal Nasopharyngeal Swab     Status: None   Collection Time: 08/24/20  3:38 PM   Specimen: Nasopharyngeal Swab  Result Value Ref Range Status   SARS Coronavirus 2 NEGATIVE NEGATIVE Final    Comment: (NOTE) SARS-CoV-2 target nucleic acids are NOT DETECTED.  The SARS-CoV-2 RNA is generally detectable in upper and lower respiratory specimens during the acute phase of infection. The lowest concentration of SARS-CoV-2 viral copies this assay can detect is 250 copies / mL. A negative result does not preclude SARS-CoV-2 infection and should not be used as the sole basis for treatment or other patient management decisions.  A negative  result may occur with improper specimen collection / handling, submission of specimen other than nasopharyngeal swab, presence of viral mutation(s) within the areas targeted by this assay, and inadequate number of viral copies (<250 copies / mL). A negative result must be combined with clinical observations, patient history, and epidemiological information.  Fact Sheet for Patients:   StrictlyIdeas.no  Fact Sheet for Healthcare Providers: BankingDealers.co.za  This test is not yet approved or  cleared by the [place] FDA and has been authorized for detection and/or diagnosis of SARS-CoV-2 by FDA under an Emergency Use Authorization (EUA).  This EUA will remain in effect (meaning this test can be used) for the duration of the COVID-19 declaration under Section 564(b)(1) of the Act, 21 U.S.C. section 360bbb-3(b)(1), unless the authorization is terminated or revoked sooner.  Performed at [facility], [place] [address] [place], [place] [address]         Radiology Studies: No results found.      Scheduled Meds: . atorvastatin  40 mg Oral QHS  . cinacalcet  30 mg Oral Q supper  . escitalopram  10  mg Oral Daily  . fluticasone  2 spray Each Nare BID  . heparin  5,000 Units Subcutaneous Q8H  . insulin aspart  0-6 Units Subcutaneous Q6H  . insulin detemir  5 Units Subcutaneous QHS  . metoprolol succinate  50 mg Oral BID  . mirtazapine  7.5 mg Oral QHS  . montelukast  10 mg Oral QHS  . mupirocin ointment  1 application Nasal BID  . pantoprazole  40 mg Oral Daily  . sevelamer carbonate  3,200 mg Oral TID WC  . sodium chloride flush  3 mL Intravenous Q12H  . sodium chloride flush  3 mL Intravenous Q12H  . vancomycin variable dose per unstable renal function (pharmacist dosing)   Does not apply See admin instructions   Continuous Infusions: . sodium chloride    . ciprofloxacin       LOS: 1 day      Cordelia Poche, MD [facility] 08/25/2020, 7:27 AM  If 7PM-7AM, please contact night-coverage [email or web address]

## 2020-08-25 NOTE — Transfer of Care (Signed)
Immediate Anesthesia Transfer of Care Note  Patient: BOLESLAW BORGHI  Procedure(s) Performed: IRRIGATION AND DEBRIDEMENT OF RIGHT FOOT (Right Foot)  Patient Location: PACU  Anesthesia Type:MAC  Level of Consciousness: drowsy and patient cooperative  Airway & Oxygen Therapy: Patient Spontanous Breathing and Patient connected to face mask oxygen  Post-op Assessment: Report given to RN and Post -op Vital signs reviewed and stable  Post vital signs: Reviewed and stable  Last Vitals:  Vitals Value Taken Time  BP 101/71 08/25/20 1302  Temp    Pulse 68 08/25/20 1303  Resp 17 08/25/20 1303  SpO2 99 % 08/25/20 1303  Vitals shown include unvalidated device data.  Last Pain:  Vitals:   08/25/20 0755  TempSrc:   PainSc: 0-No pain         Complications: No complications documented.

## 2020-08-25 NOTE — Consult Note (Signed)
Nephrology Consult   Requesting provider: Gardenia Phlegm, MD  Service requesting consult: Internal medicine Reason for consult: ESRD requiring dialysis   Assessment/Recommendations: Ralph Peterson is a/an 55 y.o. male with a past medical history ESRD on HD (MWF), A. 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  ESRD Patient is hemodialysis patient on Monday Wednesday Friday in [place].  Patient reports that his dry weight at dialysis used to be 95 but recently they have been dialyzing him to 98 kg.  On arrival he was 104.5 kg.  He last received dialysis 2/2.  Fistula in right forearm.  Reports that he rarely has edema in his lower extremities and that he typically holds all of his fluid in his abdomen.  Denies any shortness of breath.  He has been admitted for osteomyelitis and will require inpatient dialysis.  -We will place orders for inpatient dialysis, may occur tonight but may have to wait until tomorrow depending on length of his operation  Right foot plantar abscess at the second metatarsal with concern for osteomyelitis -Management per primary team  T2DM Paroxysmal A. fib CAD s/p PCI with severe PAD HTN -Continue home Toprol-XL HLD   [facility] 08/25/2020 10:52 AM   _____________________________________________________________________________________ CC: Right foot pain  History of Present Illness: Ralph Peterson is a/an 55 y.o. male with a past medical history of ESRD on HD (MWF), A. fib on Eliquis, CAD s/p PCI, T2DM, PAD with multiple interventions, Hx left toe amputation, TMA and right second and third toe amputation, prior tobacco usewho presents with right foot pain.  MRI consistent with osteomyelitis so patient was admitted.  Patient receives  dialysis in [place] on Monday Wednesday and Friday and will be hospitalized so will require inpatient dialysis.  Nephrology was consulted to assist with setting this up.   Medications:  Current Facility-Administered Medications  Medication Dose Route Frequency Provider Last Rate Last Admin  . 0.9 %  sodium chloride infusion  250 mL Intravenous PRN Eugenie Filler, MD      . albuterol (VENTOLIN HFA) 108 (90 Base) MCG/ACT inhaler 2 puff  2 puff Inhalation Q6H PRN Eugenie Filler, MD      . atorvastatin (LIPITOR) tablet 40 mg  40 mg Oral QHS Eugenie Filler, MD   40 mg at 08/24/20 2312  . cinacalcet (SENSIPAR) tablet 30 mg  30 mg Oral Q supper Eugenie Filler, MD      . ciprofloxacin (CIPRO) IVPB 400 mg  400 mg Intravenous Q24H Eugenie Filler, MD      . escitalopram (LEXAPRO) tablet 10 mg  10 mg Oral Daily Eugenie Filler, MD   10 mg at 08/25/20 0959  . fluticasone (FLONASE) 50 MCG/ACT nasal spray 2 spray  2 spray Each Nare BID Eugenie Filler, MD   2 spray at 08/25/20 1000  . heparin injection 5,000 Units  5,000 Units Subcutaneous Q8H Eugenie Filler, MD      . hydrALAZINE (APRESOLINE) injection 10 mg  10 mg Intravenous Q6H PRN Eugenie Filler, MD      . HYDROmorphone (DILAUDID) injection 0.5-1 mg  0.5-1 mg Intravenous Q2H PRN Eugenie Filler, MD      . insulin aspart (novoLOG) injection 0-6 Units  0-6 Units Subcutaneous Q6H Eugenie Filler, MD      .  insulin detemir (LEVEMIR) injection 5 Units  5 Units Subcutaneous QHS Eugenie Filler, MD   5 Units at 08/24/20 2322  . levalbuterol (XOPENEX) nebulizer solution 0.63 mg  0.63 mg Nebulization Q6H PRN Eugenie Filler, MD      . loperamide (IMODIUM) capsule 2 mg  2 mg Oral PRN Eugenie Filler, MD      . metoprolol succinate (TOPROL-XL) 24 hr tablet 50 mg  50 mg Oral BID Eugenie Filler, MD   50 mg at 08/25/20 0959  . mirtazapine (REMERON) tablet 7.5 mg  7.5 mg Oral QHS Eugenie Filler, MD   7.5  mg at 08/24/20 2313  . montelukast (SINGULAIR) tablet 10 mg  10 mg Oral QHS Eugenie Filler, MD   10 mg at 08/24/20 2313  . mupirocin ointment (BACTROBAN) 2 % 1 application  1 application Nasal BID Eugenie Filler, MD   1 application at [id number] 1000  . ondansetron (ZOFRAN) tablet 4 mg  4 mg Oral Q6H PRN Eugenie Filler, MD       Or  . ondansetron [facility]) injection 4 mg  4 mg Intravenous Q6H PRN Eugenie Filler, MD      . pantoprazole (PROTONIX) EC tablet 40 mg  40 mg Oral Daily Eugenie Filler, MD   40 mg at 08/25/20 0959  . senna-docusate (Senokot-S) tablet 1 tablet  1 tablet Oral QHS PRN Eugenie Filler, MD      . sevelamer carbonate (RENVELA) tablet 3,200 mg  3,200 mg Oral TID WC Eugenie Filler, MD   3,200 mg at 08/24/20 2324  . sevelamer carbonate (RENVELA) tablet 800 mg  800 mg Oral Daily PRN Eugenie Filler, MD      . sodium chloride flush (NS) 0.9 % injection 3 mL  3 mL Intravenous Q12H Eugenie Filler, MD      . sodium chloride flush (NS) 0.9 % injection 3 mL  3 mL Intravenous Q12H Eugenie Filler, MD   3 mL at 08/25/20 1001  . sodium chloride flush (NS) 0.9 % injection 3 mL  3 mL Intravenous PRN Eugenie Filler, MD      . sorbitol 70 % solution 30 mL  30 mL Oral Daily PRN Eugenie Filler, MD      . traMADol Veatrice Bourbon) tablet 50 mg  50 mg Oral Q12H PRN Eugenie Filler, MD      . vancomycin variable dose per unstable renal function (pharmacist dosing)   Does not apply See admin instructions Emiliano Dyer, [facility]         ALLERGIES Oxycodone-acetaminophen, Gabapentin, Tramadol, and Penicillins  MEDICAL HISTORY Past Medical History:  Diagnosis Date  . Anxiety   . Arrhythmia    atrial fibrillation  . Atrial fibrillation ([place])   . CAD (coronary artery disease)    a. 09/2010 Cath/PCI ([facility]): LM nl, LAD 74m D1 80 (small), LCX 35mOM1 30, RI 70 (small), RCA 70 (DES).  . CHF (congestive heart failure) ([place]  . Coronary artery disease   .  COVID-19 virus infection 07/2019  . Diabetes mellitus without complication ([place]  . ESRD (end stage renal disease) ([place]  . Hyperlipidemia   . Hypertension   . Ischemic cardiomyopathy    a.  12/2011 Echo ([facility]): NL EF, mod LVH. Mild AS/MS, triv PR/TR. 07/2019 Echo: EF 25-30%, GR1 DD, inf/post HK, low nl RV fxn, mildly dil RA, triv MR, mild Ao sclerosis w/o stenosis.  .Marland Kitchen  NSTEMI (non-ST elevated myocardial infarction) ([place]) 07/2019  . PAD (peripheral artery disease) ([place])    a. 03/2016 s/p PTA/DEB R SFA/popliteal/peroneal; b. 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 08/2017 Atherectomy/DEB dist L SFA/popliteal. PTA of L AT; d. 06/2018 PTA/DEB L SFA/popliteal/PT/AT; e. 01/2019 Stable ABIs.  . Sleep apnea      SOCIAL HISTORY Social History   Socioeconomic History  . Marital status: Single    Spouse name: Not on file  . Number of children: Not on file  . Years of education: Not on file  . Highest education level: Not on file  Occupational History  . Not on file  Tobacco Use  . Smoking status: Former Smoker    Types: Cigarettes    Quit date: 2010    Years since quitting: 12.1  . Smokeless tobacco: Never Used  Vaping Use  . Vaping Use: Never used  Substance and Sexual Activity  . Alcohol use: Not Currently  . Drug use: No  . Sexual activity: Not on file  Other Topics Concern  . Not on file  Social History Narrative  . Not on file   Social Determinants of Health   Financial Resource Strain: Not on file  Food Insecurity: Not on file  Transportation Needs: Not on file  Physical Activity: Not on file  Stress: Not on file  Social Connections: Not on file  Intimate Partner Violence: Not on file     FAMILY HISTORY Family History  Problem Relation Age of Onset  . Hypertension Mother       Review of Systems: 62 systems reviewed Otherwise as per HPI, all other systems reviewed and negative  Physical Exam: Vitals:   08/25/20 0110 08/25/20 0617  BP: 97/72 120/74  Pulse: 69 65   Resp:    Temp: 98 F (36.7 C) 98.4 F (36.9 C)  SpO2: 97% 98%   No intake/output data recorded.  Intake/Output Summary (Last 24 hours) at 08/25/2020 1052 Last data filed at 08/24/2020 1900 Gross per 24 hour  Intake 250 ml  Output --  Net 250 ml   General: well-appearing, no acute distress HEENT: anicteric sclera, oropharynx clear without lesions CV: regular rate, normal rhythm, no murmurs, no gallops, no rubs, no peripheral edema Lungs: clear to auscultation bilaterally, normal work of breathing Abd: soft, non-tender, non-distended Skin:erythema of right lower extremity Psych: alert, engaged, appropriate mood and affect Musculoskeletal: Transmetatarsal amputation of the left foot, no edema noted in lower extremities but erythema noted in right lower extremity Neuro: normal speech, no gross focal deficits   Test Results Reviewed Lab Results  Component Value Date   NA 134 (L) 08/25/2020   K 5.0 08/25/2020   CL 94 (L) 08/25/2020   CO2 27 08/25/2020   BUN 37 (H) 08/25/2020   CREATININE 5.58 (H) 08/25/2020   CALCIUM 9.2 08/25/2020   ALBUMIN 2.7 (L) 08/25/2020   PHOS 4.1 08/25/2020     I have reviewed all relevant outside healthcare records related to the patient's current hospitalization

## 2020-08-25 NOTE — Consult Note (Signed)
  Subjective:  Patient ID: Ralph Peterson, male    DOB: 02-Jun-1966,  MRN: [id number]  A 55 y.o. male presents with past medical history of A. fib end-stage renal disease with on dialysis and diabetes with concern for right foot abscess with possible osteomyelitis.  Patient states he is doing well.  He was doing local wound care.  I received a phone call from infectious disease that the MRI was positive for an abscess formation.  He did not have any systemic signs of infection.  He denies any other acute complaints.  He would like to get this taken care of.  Objective:   Vitals:   08/25/20 0110 08/25/20 0617  BP: 97/72 120/74  Pulse: 69 65  Resp:    Temp: 98 F (36.7 C) 98.4 F (36.9 C)  SpO2: 97% 98%   General AA&O x3. Normal mood and affect.  Vascular Dorsalis pedis and posterior tibial pulses 2/4 bilat. Brisk capillary refill to all digits. Pedal hair present.  Neurologic Epicritic sensation grossly intact.  Dermatologic  area of fluctuance noted with plantar wound at the level of the previous second ray amputation.  No crepitus noted no redness noted.  No purulent drainage is able to express.  Orthopedic: MMT 5/5 in dorsiflexion, plantarflexion, inversion, and eversion. Normal joint ROM without pain or crepitus.        Assessment & Plan:  Patient was evaluated and treated and all questions answered.  Right foot abscess with concern for underlying osteomyelitis -I explained to the patient the etiology of abscess formation various treatment options were discussed.  Patient had had a previous amputation done by me which mostly healed however he was being seen at the wound care for superficial dehiscence and management.  However it appears that patient may have developed an abscess in the interim that led to plantar wound formation and therefore concern for deeper infection.  I discussed with the patient that he would benefit from IV antibiotics as well as aggressive debridement  of the foot with possible incision and drainage washout debridement and a bone biopsy if needed.  At this time it does not appear that the abscess is correlating with the previous transmetatarsal amputation of the second however if there is any concern intraoperatively I discussed with the patient that we will obtain some bone biopsy.  He states understanding would like to proceed with the surgery. -I discussed my surgical plan in extensive detail which includes incision and drainage with washout and debridement of the abscess as well as a possible bone biopsy. -He will be nonweightbearing to the right lower extremity in a [place] wedge shoe after the surgery. -If clinically if I am able to clear out all the infection I will plan on primarily closing it.  I discussed this with the patient in extensive detail. -He will need physical therapy for nonweightbearing status to the right lower extremity -If I am able to primarily close he would not need any dressing changes until follow-up in clinic.  He will follow up in clinic 1 week after discharge.  Felipa Furnace, DPM  Accessible via secure chat for questions or concerns.

## 2020-08-25 NOTE — Evaluation (Signed)
Physical Therapy Evaluation Patient Details Name: Ralph Peterson MRN: [id number] DOB: 05/23/1966 Today's Date: 08/25/2020   History of Present Illness  Pt is a 55 y.o. male admitted 08/24/20 with worsening R foot pain. 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 Awaiting ID and vascular sx input. PMH includes CAD, CHF, DM, ESRD, HTN, PAD (s/p 2nd toe amputation), OSA, afib, anxiety.    Clinical Impression  Pt presents with an overall decrease in functional mobility secondary to above. PTA, pt mod indep with RW for household and w/c for community distances; lives with supportive children and family. Today, pt mod indep ambulating with RW to simulate decreased WB through R foot as well as R foot NWB in preparation for surgical intervention this afternoon. Will follow-up post-up to assess functional mobility with post-op pain and weakness, as well as potential WB restrictions; suspect pt will not require follow-up PT services.    Follow Up Recommendations No PT follow up (pending progress post-op)    Equipment Recommendations  None recommended by PT    Recommendations for Other Services       Precautions / Restrictions Precautions Precautions: Fall Restrictions Other Position/Activity Restrictions: Has darco shoe from home to attempt to keep weight off R foot wound - practiced with decreased WB through R foot as well as hopping to simulate RLE NWB (since we are unsure what WB precautions will be post-op)      Mobility  Bed Mobility Overal bed mobility: Modified Independent             General bed mobility comments: HOB elevated    Transfers Overall transfer level: Modified independent Equipment used: Rolling walker (2 wheeled)                Ambulation/Gait Ambulation/Gait assistance: Modified independent (Device/Increase time) Gait Distance (Feet): 40  Feet Assistive device: Rolling walker (2 wheeled)   Gait velocity: Decreased   General Gait Details: Ambulated in room mod indep with RW with decreased WB through R foot; additional gait trial with RW while maintaining R foot NWB, pt able to hop short distance well on LLE (to simulate potential for NWB precautions post-op)  Stairs            Wheelchair Mobility    Modified Rankin (Stroke Patients Only)       Balance Overall balance assessment: Needs assistance   Sitting balance-Leahy Scale: Good       Standing balance-Leahy Scale: Fair Standing balance comment: Can stand and take steps without UE support; stability improved with RW and improved ability to decrease WB through R foot                             Pertinent Vitals/Pain Pain Assessment: Faces Faces Pain Scale: Hurts a little bit Pain Location: R foot Pain Descriptors / Indicators: Discomfort;Guarding Pain Intervention(s): Monitored during session;Limited activity within patient's tolerance    Home Living Family/patient expects to be discharged to:: Private residence Living Arrangements: Children Available Help at Discharge: Family Type of Home: House Home Access: Stairs to enter Entrance Stairs-Rails: None (hoping rail put up soon) Entrance Stairs-Number of Steps: 3-4 Home Layout: One level Home Equipment: Walker - 2 wheels;Wheelchair - manual Additional Comments: Lives with children who are supportive    Prior Function Level of Independence: Needs assistance   Gait / Transfers Assistance Needed:  Mod indep household ambulation with RW, tries to stay off R foot as much as possible; uses w/c for community distances. Holds onto son on steps out of house, family sets walker at top/bottom. Son drives and assists with dressing changes  ADL's / Homemaking Assistance Needed: Pt not supposed to get R foot wound wet, so he sponge bathes sitting in front of sink; he can stand to use microwave, but  states, "I typically don't stay on my feet long enough to cook a meal, so the kids help with cooking"        Hand Dominance        Extremity/Trunk Assessment   Upper Extremity Assessment Upper Extremity Assessment: Overall WFL for tasks assessed    Lower Extremity Assessment Lower Extremity Assessment: RLE deficits/detail;LLE deficits/detail RLE Deficits / Details: R 2nd metatarsal amputation w/ wound; lower leg/ankle redness; hip and knee at least 3/5 strength RLE Sensation: history of peripheral neuropathy LLE Deficits / Details: h/o transmet amputation; lower leg/ankle redness; hip and knee at least 3/5 strength LLE Sensation: history of peripheral neuropathy       Communication   Communication: No difficulties  Cognition Arousal/Alertness: Awake/alert Behavior During Therapy: WFL for tasks assessed/performed Overall Cognitive Status: Within Functional Limits for tasks assessed                                        General Comments      Exercises Other Exercises Other Exercises: Encouraged frequent bilateral hip/knee ROM, including heel slides, SLR, LAQ, seated marching   Assessment/Plan    PT Assessment Patient needs continued PT services  PT Problem List Decreased activity tolerance;Decreased balance;Decreased mobility;Decreased knowledge of precautions;Pain       PT Treatment Interventions DME instruction;Gait training;Stair training;Functional mobility training;Therapeutic activities;Therapeutic exercise;Balance training;Patient/family education    PT Goals (Current goals can be found in the Care Plan section)  Acute Rehab PT Goals Patient Stated Goal: "Get this procedure over with" PT Goal Formulation: With patient Time For Goal Achievement: 09/08/20 Potential to Achieve Goals: Good    Frequency Min 3X/week   Barriers to discharge        Co-evaluation               AM-PAC PT "6 Clicks" Mobility  Outcome Measure Help  needed turning from your back to your side while in a flat bed without using bedrails?: None Help needed moving from lying on your back to sitting on the side of a flat bed without using bedrails?: None Help needed moving to and from a bed to a chair (including a wheelchair)?: None Help needed standing up from a chair using your arms (e.g., wheelchair or bedside chair)?: None Help needed to walk in hospital room?: None Help needed climbing 3-5 steps with a railing? : A Little 6 Click Score: 23    End of Session   Activity Tolerance: Patient tolerated treatment well Patient left: in chair;with call bell/phone within reach Nurse Communication: Mobility status PT Visit Diagnosis: Other abnormalities of gait and mobility (R26.89);Pain Pain - Right/Left: Right Pain - part of body: Ankle and joints of foot    Time: [id number] PT Time Calculation (min) (ACUTE ONLY): 20 min   Charges:   PT Evaluation $PT Eval Moderate Complexity: [place], PT, DPT Acute Rehabilitation Services  Pager [phone] Office [place] 08/25/2020, 10:22  AM

## 2020-08-25 NOTE — Op Note (Signed)
Surgeon: Surgeon(s): Felipa Furnace, DPM  Assistants: None Pre-operative diagnosis: infection right foot  Post-operative diagnosis: same Procedure: Procedure(s) (LRB): IRRIGATION AND DEBRIDEMENT OF RIGHT FOOT (Right)  Pathology:  ID Type Source Tests Collected by Time Destination  A : Right foot wound Abscess Wound AEROBIC/ANAEROBIC CULTURE (SURGICAL/DEEP WOUND) Felipa Furnace, DPM 08/25/2020 1248     Pertinent Intra-op findings: Localized abscess in the second interspace did not track down to the bone. Anesthesia: Choice  Hemostasis: None EBL: 10 mL  Materials: 3-0 Prolene Injectables: None Complications: None  Indications for surgery: A 55 y.o. male presents with right forefoot second interspace abscess. Patient has failed all conservative therapy including but not limited to local wound care, IV antibiotics. He wishes to have surgical correction of the foot/deformity. It was determined that patient would benefit from right foot incision and drainage, washout, debridement. Informed surgical risk consent was reviewed and read aloud to the patient.  I reviewed the films.  I have discussed my findings with the patient in great detail.  I have discussed all risks including but not limited to infection, stiffness, scarring, limp, disability, deformity, damage to blood vessels and nerves, numbness, poor healing, need for braces, arthritis, chronic pain, amputation, death.  All benefits and realistic expectations discussed in great detail.  I have made no promises as to the outcome.  I have provided realistic expectations.  I have offered the patient a 2nd opinion, which they have declined and assured me they preferred to proceed despite the risks   Procedure in detail: The patient was both verbally and visually identified by myself, the nursing staff, and anesthesia staff in the preoperative holding area. They were then transferred to the operating room and placed on the operative table in supine  position.  Attention was directed to the right foot, plantar ulceration was noted.  The ulcer appears to be tracking down to deep tissue.  At this time a longitudinal incision was made to includes and excised the ulcer out, the incision was carried down to deep tissue.  It appears a localized abscess was to the level of the deep fascia.  The abscess was completely drained.  No signs of infection noted.  The culture was taken in standard sterile technique.  The wound was thoroughly irrigated with normal saline solution utilizing pulse lavage.  At this time no signs of infection noted.  The wound was primarily closed given that there were no further abscess noted.  At this time is important that prior transmetatarsal amputation of the second and third knee bone was not violated with the abscess.  The wound was primarily closed with 3-0 Prolene.  The incision was dressed with Betadine wet-to-dry dressing, Kerlix, Ace bandage.  At the conclusion of the procedure the patient was awoken from anesthesia and found to have tolerated the procedure well any complications. There were transferred to PACU with vital signs stable and vascular status intact.  Boneta Lucks, DPM

## 2020-08-26 ENCOUNTER — Encounter (HOSPITAL_COMMUNITY): Payer: Self-pay | Admitting: Podiatry

## 2020-08-26 DIAGNOSIS — L02619 Cutaneous abscess of unspecified foot: Secondary | ICD-10-CM | POA: Diagnosis not present

## 2020-08-26 LAB — GLUCOSE, CAPILLARY
Glucose-Capillary: 118 mg/dL — ABNORMAL HIGH (ref 70–99)
Glucose-Capillary: 45 mg/dL — ABNORMAL LOW (ref 70–99)
Glucose-Capillary: 73 mg/dL (ref 70–99)
Glucose-Capillary: 83 mg/dL (ref 70–99)
Glucose-Capillary: 85 mg/dL (ref 70–99)
Glucose-Capillary: 93 mg/dL (ref 70–99)

## 2020-08-26 LAB — CBC
HCT: 33.8 % — ABNORMAL LOW (ref 39.0–52.0)
Hemoglobin: 11.2 g/dL — ABNORMAL LOW (ref 13.0–17.0)
MCH: 31.1 pg (ref 26.0–34.0)
MCHC: 33.1 g/dL (ref 30.0–36.0)
MCV: 93.9 fL (ref 80.0–100.0)
Platelets: 121 10*3/uL — ABNORMAL LOW (ref 150–400)
RBC: 3.6 MIL/uL — ABNORMAL LOW (ref 4.22–5.81)
RDW: 19.1 % — ABNORMAL HIGH (ref 11.5–15.5)
WBC: 6.2 10*3/uL (ref 4.0–10.5)
nRBC: 0 % (ref 0.0–0.2)

## 2020-08-26 LAB — RENAL FUNCTION PANEL
Albumin: 2.8 g/dL — ABNORMAL LOW (ref 3.5–5.0)
Anion gap: 16 — ABNORMAL HIGH (ref 5–15)
BUN: 56 mg/dL — ABNORMAL HIGH (ref 6–20)
CO2: 22 mmol/L (ref 22–32)
Calcium: 9.1 mg/dL (ref 8.9–10.3)
Chloride: 91 mmol/L — ABNORMAL LOW (ref 98–111)
Creatinine, Ser: 7.36 mg/dL — ABNORMAL HIGH (ref 0.61–1.24)
GFR, Estimated: 8 mL/min — ABNORMAL LOW (ref >60)
Glucose, Bld: 79 mg/dL (ref 70–99)
Phosphorus: 4.3 mg/dL (ref 2.5–4.6)
Potassium: 6.3 mmol/L (ref 3.5–5.1)
Sodium: 129 mmol/L — ABNORMAL LOW (ref 135–145)

## 2020-08-26 MED ORDER — LIDOCAINE HCL (PF) 1 % IJ SOLN: 5.0000 mL | INTRAMUSCULAR | Status: DC | PRN

## 2020-08-26 MED ORDER — VANCOMYCIN HCL IN DEXTROSE 1-5 GM/200ML-% IV SOLN
1000.0000 mg | INTRAVENOUS | Status: AC
  Administered 2020-08-26: 1000 mg via INTRAVENOUS

## 2020-08-26 MED ORDER — LIDOCAINE-PRILOCAINE 2.5-2.5 % EX CREA: 1.0000 "application " | TOPICAL_CREAM | CUTANEOUS | Status: DC | PRN

## 2020-08-26 MED ORDER — SODIUM CHLORIDE 0.9 % IV SOLN: 100.0000 mL | INTRAVENOUS | Status: DC | PRN

## 2020-08-26 MED ORDER — ALTEPLASE 2 MG IJ SOLR: 2.0000 mg | Freq: Once | INTRAMUSCULAR | Status: DC | PRN

## 2020-08-26 MED ORDER — PENTAFLUOROPROP-TETRAFLUOROETH EX AERO: 1.0000 "application " | INHALATION_SPRAY | CUTANEOUS | Status: DC | PRN

## 2020-08-26 MED ORDER — GUAIFENESIN 100 MG/5ML PO SOLN
15.0000 mL | ORAL | Status: DC | PRN
  Administered 2020-08-27 (×3): 300 mg via ORAL
  Filled 2020-08-26: qty 5
  Filled 2020-08-26: qty 10
  Filled 2020-08-26: qty 15
  Filled 2020-08-26: qty 5

## 2020-08-26 MED ORDER — INSULIN ASPART 100 UNIT/ML ~~LOC~~ SOLN: 0.0000 [IU] | Freq: Three times a day (TID) | SUBCUTANEOUS | Status: DC

## 2020-08-26 MED ORDER — ALBUTEROL SULFATE HFA 108 (90 BASE) MCG/ACT IN AERS
2.0000 | INHALATION_SPRAY | Freq: Four times a day (QID) | RESPIRATORY_TRACT | Status: DC | PRN
  Filled 2020-08-26: qty 6.7

## 2020-08-26 MED ORDER — ALBUMIN HUMAN 25 % IV SOLN
INTRAVENOUS | Status: AC
  Administered 2020-08-26: 12.5 g
  Filled 2020-08-26: qty 50

## 2020-08-26 MED ORDER — HEPARIN SODIUM (PORCINE) 1000 UNIT/ML DIALYSIS: 1000.0000 [IU] | INTRAMUSCULAR | Status: DC | PRN

## 2020-08-26 MED ORDER — VANCOMYCIN HCL IN DEXTROSE 1-5 GM/200ML-% IV SOLN
INTRAVENOUS | Status: AC
  Filled 2020-08-26: qty 200

## 2020-08-26 NOTE — Procedures (Signed)
Patient seen on Hemodialysis. He reports an uneventful night after surgery yesterday.  BP (!) 90/54 (BP Location: Left Arm)   Pulse 60   Temp 98.5 F (36.9 C) (Oral)   Resp 16   Ht 6' (1.829 m)   Wt 104.3 kg   SpO2 93%   BMI 31.19 kg/m   QB 400, UF goal 2L (down from 3L due to low blood pressure) Tolerating treatment without complaints at this time.   Elmarie Shiley MD [facility]. Office # [phone] Pager # [phone] 9:58 AM

## 2020-08-26 NOTE — Progress Notes (Addendum)
PROGRESS NOTE    Ralph Peterson  [id number] DOB: 04-Apr-1966 DOA: 08/24/2020 PCP: Vidal Schwalbe, MD   Brief Narrative: Ralph Peterson is a 55 y.o. 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 Patient presented secondary to foot pain with evidence of post-op wound infection.   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 Overall patient is stable. Possible osteomyelitis of 2nd digit amputation site. -Podiatry recommendations: surgery -Infectious disease consult pending surgical management 2/5 Patient with washed out of the wound on 2/4, will continue Abx  for now. Surgical culture in lab, will follow up on final result.   ESRD on HD MWF schedule. Nephrology consulted on admission. 2/5 Had HD today with low BP, received albumin, BP now stable. Had elevated potassium, will continue monitoring. Continue HD per Nephrology.   Diabetes mellitus, type 2 -Continue Levemir and SSI 2/5 Had hypoglycemia today, will dc levemir and monitor.   Paroxysmal atrial fibrillation On metoprolol and Eliquis -Continue Toprol XL -Hold Eliquis secondary to planned surgical management.  2/5 Continue hold eliquis until no further surgical intervention indicated.   CAD Hyperlipidemia Patient is s/p PCI. -Continue Lipitor  Severe PAD Patient follows with vascular surgery.   Primary hypertension -Continue metoprolol   DVT prophylaxis: SCDs Code Status:   Code Status: Full Code Family Communication: None at bedside Disposition Plan: Discharge home in 2-3 days pending wound culture result and ABx recommendations.    Consultants:   Podiatry  Procedures:   2/4 Right foot wound wash out  Antimicrobials:  Vancomycin  Ciprofloxacin    Subjective: Low blood sugar today, reports eating his meals but lunch was late due to HD.   Objective: Vitals:   08/26/20 1030 08/26/20 1100 08/26/20 1130 08/26/20 1200  BP: (!) 103/59 (!) 100/58 (!) 100/55 110/90  Pulse: 69 69  74  Resp:    18  Temp:    98.7 F (37.1 C)  TempSrc:    Oral  SpO2:    95%  Weight:    101 kg  Height:        Intake/Output Summary (Last 24 hours) at 08/26/2020 1653 Last data filed at 08/26/2020 1200 Gross per 24 hour  Intake 320 ml  Output 2000 ml  Net -1680 ml   [employer]   08/25/20  1146 08/26/20 0920 08/26/20 1200  Weight: 104.3 kg 103.1 kg 101 kg    Examination:  General exam: Appears calm and comfortable Respiratory system: No cough. Respiratory effort normal. Cardiovascular system: S1 & S2 heard, RRR.  Gastrointestinal system: Abdomen is nondistended, soft  and nontender.  Central nervous system: Alert and oriented. No focal neurological deficits. Musculoskeletal: No edema. No calf tenderness. Left transmetatarsal amputation. Right foot wrapped in gauze and ace bandage that are c/d/i.  Skin: No cyanosis. No rashes Psychiatry: Judgement and insight appear normal. Mood & affect appropriate.     Data Reviewed: I have personally reviewed following labs and imaging studies  CBC Lab Results  Component Value Date   WBC 6.2 08/26/2020   RBC 3.60 (L) 08/26/2020   HGB 11.2 (L) 08/26/2020   HCT 33.8 (L) 08/26/2020   MCV 93.9 08/26/2020   MCH 31.1 08/26/2020   PLT 121 (L) 08/26/2020   MCHC 33.1 08/26/2020   RDW 19.1 (H) 08/26/2020   LYMPHSABS 1.4 08/25/2020   MONOABS 0.5 08/25/2020   EOSABS 0.3 08/25/2020   BASOSABS 0.1 [id number]     Last metabolic panel Lab Results  Component Value Date   NA 129 (L) 08/26/2020   K 6.3 (HH) 08/26/2020   CL 91 (L) 08/26/2020   CO2 22 08/26/2020   BUN 56 (H) 08/26/2020   CREATININE 7.36 (H) 08/26/2020   GLUCOSE 79 08/26/2020   GFRNONAA 8 (L) 08/26/2020   GFRAA 10 (L) 03/10/2020   CALCIUM 9.1 08/26/2020   PHOS 4.3 08/26/2020   PROT 6.9 08/24/2020   ALBUMIN 2.8 (L) 08/26/2020   LABGLOB 2.7 03/19/2016   AGRATIO 1.5 03/19/2016   BILITOT 1.0 08/24/2020   ALKPHOS 109 08/24/2020   AST 20 08/24/2020   ALT 13 08/24/2020   ANIONGAP 16 (H) 08/26/2020    CBG (last 3)  Recent Labs    08/26/20 1328 08/26/20 1354 08/26/20 1605  GLUCAP 45* 73 118*     GFR: Estimated Creatinine Clearance: 14.4 mL/min (A) (by C-G formula based on SCr of 7.36 mg/dL (H)).  Coagulation Profile: No results for input(s): INR, PROTIME in the last 168 hours.  Recent Results (from the past 240 hour(s))  SARS Coronavirus 2 by RT PCR (hospital order, performed in [facility] hospital lab) Nasopharyngeal Nasopharyngeal Swab     Status: None   Collection Time: 08/24/20  3:38 PM   Specimen: Nasopharyngeal Swab  Result Value  Ref Range Status   SARS Coronavirus 2 NEGATIVE NEGATIVE Final    Comment: (NOTE) SARS-CoV-2 target nucleic acids are NOT DETECTED.  The SARS-CoV-2 RNA is generally detectable in upper and lower respiratory specimens during the acute phase of infection. The lowest concentration of SARS-CoV-2 viral copies this assay can detect is 250 copies / mL. A negative result does not preclude SARS-CoV-2 infection and should not be used as the sole basis for treatment or other patient management decisions.  A negative result may occur with improper specimen collection / handling, submission of specimen other than nasopharyngeal swab, presence of viral mutation(s) within the areas targeted by this assay, and inadequate number of viral copies (<250 copies / mL). A negative result must be combined with clinical observations, patient history, and epidemiological information.  Fact Sheet for Patients:   StrictlyIdeas.no  Fact Sheet for Healthcare Providers: BankingDealers.co.za  This test is not yet approved or  cleared by the [place] FDA and has been authorized for detection and/or diagnosis of SARS-CoV-2 by FDA under an Emergency Use Authorization (  EUA).  This EUA will remain in effect (meaning this test can be used) for the duration of the COVID-19 declaration under Section 564(b)(1) of the Act, 21 U.S.C. section 360bbb-3(b)(1), unless the authorization is terminated or revoked sooner.  Performed at [facility], [place] [address] [place], [place] [address]   Surgical PCR screen     Status: None   Collection Time: 08/25/20  6:20 AM   Specimen: Nasal Mucosa; Nasal Swab  Result Value Ref Range Status   MRSA, PCR NEGATIVE NEGATIVE Final   Staphylococcus aureus NEGATIVE NEGATIVE Final    Comment: (NOTE) The Xpert SA Assay (FDA approved for NASAL specimens in patients 75 years of age and older), is one component of a  comprehensive surveillance program. It is not intended to diagnose infection nor to guide or monitor treatment. Performed at [facility], [place] [address], [place], [place] [address]   Aerobic/Anaerobic Culture (surgical/deep wound)     Status: None (Preliminary result)   Collection Time: 08/25/20 12:48 PM   Specimen: Wound; Abscess  Result Value Ref Range Status   Specimen Description WOUND  Final   Special Requests RIGHT FOOT SPEC A  Final   Gram Stain   Final    FEW WBC PRESENT,BOTH PMN AND MONONUCLEAR RARE GRAM POSITIVE COCCI Performed at [facility], [place] [address] [place], [place] [address]    Culture FEW ENTEROCOCCUS FAECALIS  Final   Report Status PENDING  Incomplete        Radiology Studies: No results found.      Scheduled Meds: . atorvastatin  40 mg Oral QHS  . Chlorhexidine Gluconate Cloth  6 each Topical Q0600  . cinacalcet  30 mg Oral Q supper  . escitalopram  10 mg Oral Daily  . fluticasone  2 spray Each Nare BID  . heparin  5,000 Units Subcutaneous Q8H  . insulin aspart  0-6 Units Subcutaneous Q6H  . metoprolol succinate  50 mg Oral BID  . mirtazapine  7.5 mg Oral QHS  . montelukast  10 mg Oral QHS  . mupirocin ointment  1 application Nasal BID  . pantoprazole  40 mg Oral Daily  . sevelamer carbonate  3,200 mg Oral TID WC  . sodium chloride flush  3 mL Intravenous Q12H  . sodium chloride flush  3 mL Intravenous Q12H  . vancomycin variable dose per unstable renal function (pharmacist dosing)   Does not apply See admin instructions   Continuous Infusions: . sodium chloride    . sodium chloride Stopped (08/25/20 1258)  . ciprofloxacin 400 mg (08/25/20 1701)     LOS: 2 days     Blain Pais, MD [facility] 08/26/2020, 4:53 PM  If 7PM-7AM, please contact night-coverage [email or web address]

## 2020-08-26 NOTE — Progress Notes (Signed)
Hypoglycemic Event  CBG: 45  Treatment: 4 oz juice/soda and 8 oz juice/soda  Symptoms: None  Follow-up CBG: Time: 1354 CBG Result:73  Possible Reasons for Event: PO intake  Comments/MD notified Hayes Ludwig, MD    Pieter Partridge

## 2020-08-26 NOTE — Progress Notes (Signed)
Physical Therapy Treatment Patient Details Name: Ralph Peterson MRN: [id number] DOB: 20-Oct-1965 Today's Date: 08/26/2020    History of Present Illness Pt is a 55 y.o. male admitted 08/24/20 with worsening R foot pain. 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 Awaiting ID and vascular sx input. PMH includes CAD, CHF, DM, ESRD, HTN, PAD (s/p 2nd toe amputation), OSA, afib, anxiety.    PT Comments    Patient is doing well. He had no change in his mobility following his debridement. He is pain was well controlled and her was able to walk while following percautions. We will continue to follow him for stair training but he is near baseline mobility.    Follow Up Recommendations  No PT follow up     Equipment Recommendations  None recommended by PT    Recommendations for Other Services       Precautions / Restrictions Precautions Precautions: Fall Restrictions Other Position/Activity Restrictions: Has darco shoe from home to attempt to keep weight off R foot wound - practiced with decreased WB through R foot as well as hopping to simulate RLE NWB (since we are unsure what WB precautions will be post-op)    Mobility  Bed Mobility Overal bed mobility: Modified Independent             General bed mobility comments: at without assit  Transfers Overall transfer level: Modified independent Equipment used: Rolling walker (2 wheeled)             General transfer comment: stood and walked acrossed the room  Ambulation/Gait Ambulation/Gait assistance: Modified independent (Device/Increase time) Gait Distance (Feet): 390 Feet Assistive device: Rolling walker (2 wheeled) Gait Pattern/deviations: Step-to pattern Gait velocity: Decreased   General Gait Details: Ambulated in room mod indep with RW with decreased WB through R foot; additional gait trial with RW  while maintaining R foot NWB, pt able to hop short distance well on LLE (to simulate potential for NWB precautions post-op)   Stairs             Wheelchair Mobility    Modified Rankin (Stroke Patients Only)       Balance Overall balance assessment: Needs assistance   Sitting balance-Leahy Scale: Good       Standing balance-Leahy Scale: Fair Standing balance comment: Can stand and take steps without UE support; stability improved with RW and improved ability to decrease WB through R foot                            Cognition Arousal/Alertness: Awake/alert Behavior During Therapy: WFL for tasks assessed/performed Overall Cognitive Status: Within Functional Limits for tasks assessed                                        Exercises      General Comments        Pertinent Vitals/Pain Pain Assessment: Faces Faces Pain Scale: Hurts a little bit Pain Location: R foot Pain Descriptors / Indicators: Discomfort;Guarding Pain Intervention(s): Monitored during session;Premedicated before session    Home Living                      Prior Function            PT Goals (  current goals can now be found in the care plan section) Acute Rehab PT Goals PT Goal Formulation: With patient Time For Goal Achievement: 09/08/20 Potential to Achieve Goals: Good    Frequency    Min 3X/week      PT Plan Current plan remains appropriate    Co-evaluation              AM-PAC PT "6 Clicks" Mobility   Outcome Measure  Help needed turning from your back to your side while in a flat bed without using bedrails?: None Help needed moving from lying on your back to sitting on the side of a flat bed without using bedrails?: None Help needed moving to and from a bed to a chair (including a wheelchair)?: None Help needed standing up from a chair using your arms (e.g., wheelchair or bedside chair)?: None Help needed to walk in hospital room?:  None Help needed climbing 3-5 steps with a railing? : A Little 6 Click Score: 23    End of Session   Activity Tolerance: Patient tolerated treatment well Patient left: in chair;with call bell/phone within reach Nurse Communication: Mobility status PT Visit Diagnosis: Other abnormalities of gait and mobility (R26.89);Pain Pain - Right/Left: Right Pain - part of body: Ankle and joints of foot     Time: 1400-1419 PT Time Calculation (min) (ACUTE ONLY): 19 min  Charges:  $Gait Training: 8-22 mins                        Carney Living PT DPT  08/26/2020, 2:43 PM

## 2020-08-26 NOTE — Progress Notes (Signed)
OT Cancellation Note  Patient Details Name: ARMAND WILLEMS MRN: [id number] DOB: 02/11/1966   Cancelled Treatment:    Reason Eval/Treat Not Completed: Patient declined, had dialysis and PT reassess today, asking for OT next date.  OT to continue efforts in the acute setting.    Richard D Popella 08/26/2020, 4:53 PM

## 2020-08-27 DIAGNOSIS — L02619 Cutaneous abscess of unspecified foot: Secondary | ICD-10-CM | POA: Diagnosis not present

## 2020-08-27 LAB — CBC WITH DIFFERENTIAL/PLATELET
Abs Immature Granulocytes: 0.01 10*3/uL (ref 0.00–0.07)
Basophils Absolute: 0.1 10*3/uL (ref 0.0–0.1)
Basophils Relative: 1 %
Eosinophils Absolute: 0.1 10*3/uL (ref 0.0–0.5)
Eosinophils Relative: 3 %
HCT: 33.8 % — ABNORMAL LOW (ref 39.0–52.0)
Hemoglobin: 11.2 g/dL — ABNORMAL LOW (ref 13.0–17.0)
Immature Granulocytes: 0 %
Lymphocytes Relative: 21 %
Lymphs Abs: 1 10*3/uL (ref 0.7–4.0)
MCH: 31.4 pg (ref 26.0–34.0)
MCHC: 33.1 g/dL (ref 30.0–36.0)
MCV: 94.7 fL (ref 80.0–100.0)
Monocytes Absolute: 0.5 10*3/uL (ref 0.1–1.0)
Monocytes Relative: 12 %
Neutro Abs: 2.9 10*3/uL (ref 1.7–7.7)
Neutrophils Relative %: 63 %
Platelets: 110 10*3/uL — ABNORMAL LOW (ref 150–400)
RBC: 3.57 MIL/uL — ABNORMAL LOW (ref 4.22–5.81)
RDW: 19.5 % — ABNORMAL HIGH (ref 11.5–15.5)
WBC: 4.6 10*3/uL (ref 4.0–10.5)
nRBC: 0 % (ref 0.0–0.2)

## 2020-08-27 LAB — BASIC METABOLIC PANEL
Anion gap: 11 (ref 5–15)
BUN: 36 mg/dL — ABNORMAL HIGH (ref 6–20)
CO2: 24 mmol/L (ref 22–32)
Calcium: 8.8 mg/dL — ABNORMAL LOW (ref 8.9–10.3)
Chloride: 96 mmol/L — ABNORMAL LOW (ref 98–111)
Creatinine, Ser: 6.07 mg/dL — ABNORMAL HIGH (ref 0.61–1.24)
GFR, Estimated: 10 mL/min — ABNORMAL LOW (ref >60)
Glucose, Bld: 81 mg/dL (ref 70–99)
Potassium: 5.5 mmol/L — ABNORMAL HIGH (ref 3.5–5.1)
Sodium: 131 mmol/L — ABNORMAL LOW (ref 135–145)

## 2020-08-27 LAB — GLUCOSE, CAPILLARY
Glucose-Capillary: 62 mg/dL — ABNORMAL LOW (ref 70–99)
Glucose-Capillary: 112 mg/dL — ABNORMAL HIGH (ref 70–99)
Glucose-Capillary: 109 mg/dL — ABNORMAL HIGH (ref 70–99)
Glucose-Capillary: 162 mg/dL — ABNORMAL HIGH (ref 70–99)

## 2020-08-27 MED ORDER — SODIUM ZIRCONIUM CYCLOSILICATE 10 G PO PACK
10.0000 g | PACK | Freq: Two times a day (BID) | ORAL | Status: AC
  Administered 2020-08-27 – 2020-08-28 (×2): 10 g via ORAL
  Filled 2020-08-27 (×2): qty 1

## 2020-08-27 MED ORDER — GLUCOSE 4 G PO CHEW
CHEWABLE_TABLET | ORAL | Status: AC
  Filled 2020-08-27: qty 1

## 2020-08-27 MED ORDER — GLUCOSE 40 % PO GEL: 1.0000 | ORAL | Status: AC

## 2020-08-27 MED ORDER — APIXABAN 5 MG PO TABS
5.0000 mg | ORAL_TABLET | Freq: Two times a day (BID) | ORAL | Status: DC
  Administered 2020-08-27 – 2020-08-29 (×4): 5 mg via ORAL
  Filled 2020-08-27 (×4): qty 1

## 2020-08-27 MED ORDER — GLUCOSE 40 % PO GEL
ORAL | Status: AC
  Administered 2020-08-27: 37.5 g via ORAL
  Filled 2020-08-27: qty 1

## 2020-08-27 MED ORDER — VANCOMYCIN VARIABLE DOSE PER UNSTABLE RENAL FUNCTION (PHARMACIST DOSING): Status: DC

## 2020-08-27 MED ORDER — DEXTROSE 50 % IV SOLN
INTRAVENOUS | Status: AC
  Filled 2020-08-27: qty 50

## 2020-08-27 MED ORDER — SODIUM CHLORIDE 0.9 % IV SOLN
2.0000 g | INTRAVENOUS | Status: DC
  Filled 2020-08-27: qty 2000

## 2020-08-27 NOTE — Progress Notes (Signed)
  Subjective:  Patient ID: Ralph Peterson, male    DOB: 22-Aug-1965,  MRN: [id number]  A 55 y.o. male presents with right foot abscess status post I&D, washout, debridement.  Patient states is doing well at bedside.  No nausea fever chills vomiting.  He states that he is ready to be discharged tomorrow.  He denies any other acute complaints.  The dressings have been clean dry and intact. Objective:   Vitals:   08/27/20 0428 08/27/20 1007  BP: 105/70 112/76  Pulse: 66   Resp: 17   Temp: 98.1 F (36.7 C)   SpO2: 97%    General AA&O x3. Normal mood and affect.  Vascular Dorsalis pedis and posterior tibial pulses 2/4 bilat. Brisk capillary refill to all digits. Pedal hair present.  Neurologic Epicritic sensation grossly intact.  Dermatologic No open lesions. Interspaces clear of maceration. Nails well groomed and normal in appearance.  Dressing is clean dry and intact.  Orthopedic: MMT 5/5 in dorsiflexion, plantarflexion, inversion, and eversion. Normal joint ROM without pain or crepitus.    Assessment & Plan:  Patient was evaluated and treated and all questions answered.  Right foot abscess status post incision and drainage washout debridement -All questions and concerns were extensively discussed with the patient. -He can be weightbearing as tolerated to the heel with the Darco wedge shoe.  However instructed him to be nonweightbearing as much as possible for the first 3 weeks.  He states understanding. -He is okay to be discharged from podiatric standpoint on antibiotics based on culture.  -No dressing changes until follow-up. -He will follow up 1 week from discharge.  I will reach out to our scheduler to make the appointment.  Felipa Furnace, DPM  Accessible via secure chat for questions or concerns.

## 2020-08-27 NOTE — Progress Notes (Signed)
Pharmacy Antibiotic Note  Ralph Peterson is a 55 y.o. male with ESRD on HD-MWF admitted on 08/24/2020 with Right Foot Abscess Osteomyelitis.  Pharmacy has been consulted for Vancomycin dosing.  Patient now s/p I&D. Last HD session off schedule on Saturday 2/5. Patient received a loading dos of vancomycin 2000 mg 2/3 and 1000 mg before HD on 2/5.  Wound cultures from January 20 revealed E. facaelis susceptible to ampicillin and vancomycin. Patient has documented PCN allergy with no re-challenging. Team would like to avoid ampicillin and continue vancomycin dosing around HD schedule.  WBC WNL today, afebrile, ANC WNL.  Plan: - No antibiotics today - Follow-up HD schedule to dose next vancomycin dose - Follow-up repeat wound cultures - Monitor clinical progress  Height: 6' (182.9 cm) Weight: 101 kg (222 lb 10.6 oz) IBW/kg (Calculated) : 77.6  Temp (24hrs), Avg:98.8 F (37.1 C), Min:98.1 F (36.7 C), Max:99.6 F (37.6 C)  Recent Labs  Lab 08/22/20 1727 08/24/20 1500 08/25/20 0412 08/25/20 1155 08/26/20 0942 08/27/20 0234  WBC 6.0 6.0 6.2  --  6.2 4.6  CREATININE 5.44* 4.41* 5.58* 5.70* 7.36* 6.07*  LATICACIDVEN 1.2 1.9  --   --   --   --     Estimated Creatinine Clearance: 17.5 mL/min (A) (by C-G formula based on SCr of 6.07 mg/dL (H)).    Allergies  Allergen Reactions  . Oxycodone-Acetaminophen Itching  . Gabapentin Other (See Comments)    unknown  . Tramadol Other (See Comments)    unknown  . Penicillins Swelling and Rash    Has patient had a PCN reaction causing immediate rash, facial/tongue/throat swelling, SOB or lightheadedness with hypotension: Yes Has patient had a PCN reaction causing severe rash involving mucus membranes or skin necrosis: Yes Has patient had a PCN reaction that required hospitalization: No Has patient had a PCN reaction occurring within the last 10 years: No If all of the above answers are "NO", then may proceed with Cephalosporin use.      Antimicrobials this admission: 1/21 PTA Bactrim >>2/3 2/3 Vanc >>        - 2/3: Vanc 2g        - 2/5: Vanc 1g before HD 2/3 Cipro >>  Microbiology results: 1/20 RLE Wound cx: + pseudomonas (PanS), E faecalis (Amp-S) 2/4 MRSA: neg  2/4 Wound Cx: rare GPC, pending     Priscillia Fouch L. Devin Going, PharmD, [place] PGY2 Pharmacy Resident Weekends 7:00 am - 3:00 pm, please call [phone] 08/27/20      11:00 AM  Please check AMION for all [place] phone numbers After 10:00 PM, call the [place] [phone]

## 2020-08-27 NOTE — Progress Notes (Signed)
PROGRESS NOTE    Ralph Peterson  [id number] DOB: 07-15-1966 DOA: 08/24/2020 PCP: Vidal Schwalbe, MD   Brief Narrative: Ralph Peterson is a 55 y.o. 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 Patient presented secondary to foot pain with evidence of post-op wound infection.   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 Overall patient is stable. Possible osteomyelitis of 2nd digit amputation site. -Podiatry recommendations: surgery -Infectious disease consult pending surgical management 2/5 Patient with washed out of the wound on 2/4, will continue Abx  for now. Surgical culture in lab, will follow up on final result.  2/6 Wound culture growing E. Faecalis, remains on Vancomycin and cipro. Consider ID consultation in am for antibiotic recommendation at discharge. Will need 1 week follow up with Podiatry, no dressing change until seen by them. Heel WB as tolerated with Darco wedge shoe.   ESRD on HD MWF schedule. Nephrology consulted on admission. 2/5 Had HD today with low BP, received albumin, BP now stable. Had elevated potassium, will continue monitoring. Continue HD per Nephrology.  2/6 Potassium level elevated again today, started on lokelma for today, monitor.   Diabetes mellitus, type 2 -Continue Levemir and SSI 2/5 Had hypoglycemia today, will dc levemir and monitor.  2/6 With hypoglycemia again today despite levemir being discontinued. Encouraged oral intake.   Paroxysmal atrial fibrillation On metoprolol and Eliquis -Continue Toprol XL -Hold Eliquis secondary to planned surgical management.  2/5 Continue hold eliquis until no further surgical intervention indicated.  2/6 Resume Eliquis today, no further surgical intervention planned per Podiatry.    CAD Hyperlipidemia Patient is s/p PCI. -Continue Lipitor  Severe PAD Patient follows with vascular surgery.   Primary hypertension -Continue metoprolol   DVT prophylaxis: SCDs Code Status:   Code Status: Full Code Family Communication: None at bedside Disposition Plan: Discharge home in 1-2 days pending improvement of his hypoglycemia, final result of wound culture, and Abx recommendations.    Consultants:   Podiatry  Procedures:   2/4 Right foot wound wash out  Antimicrobials:  Vancomycin  Ciprofloxacin    Subjective: He had low blood sugar again this morning, reports that he did not skin breakfast. Has had low blood sugars at home as well. His foot pain is well controlled.   Objective: Vitals:  08/27/20 0428 08/27/20 0500 08/27/20 1007 08/27/20 1436   BP: 105/70  112/76 116/86  Pulse: 66   72  Resp: 17   18  Temp: 98.1 F (36.7 C)   99.5 F (37.5 C)  TempSrc: Oral   Oral  SpO2: 97%   98%  Weight:  101 kg    Height:       No intake or output data in the 24 hours ending 08/27/20 1819 Filed Weights   08/26/20 0920 08/26/20 1200 08/27/20 0500  Weight: 103.1 kg 101 kg 101 kg    Examination:  General exam: Appears calm and comfortable Respiratory system: No cough. Respiratory effort normal. Cardiovascular system: S1 & S2 heard, RRR.  Gastrointestinal system: Abdomen is nondistended, soft and nontender.  Central nervous system: Alert and oriented. No focal neurological deficits. Musculoskeletal: No edema. No calf tenderness. Left transmetatarsal amputation. Right foot wrapped in gauze and ace bandage that are c/d/i.  Skin: No cyanosis. No rashes Psychiatry: Judgement and insight appear normal. Mood & affect appropriate.     Data Reviewed: I have personally reviewed following labs and imaging studies  CBC Lab Results  Component Value Date   WBC 4.6 08/27/2020   RBC 3.57 (L) 08/27/2020   HGB 11.2 (L) 08/27/2020   HCT 33.8 (L) 08/27/2020   MCV 94.7 08/27/2020   MCH 31.4 08/27/2020   PLT 110 (L) 08/27/2020   MCHC 33.1 08/27/2020   RDW 19.5 (H) 08/27/2020   LYMPHSABS 1.0 08/27/2020   MONOABS 0.5 08/27/2020   EOSABS 0.1 08/27/2020   BASOSABS 0.1 0000000     Last metabolic panel Lab Results  Component Value Date   NA 131 (L) 08/27/2020   K 5.5 (H) 08/27/2020   CL 96 (L) 08/27/2020   CO2 24 08/27/2020   BUN 36 (H) 08/27/2020   CREATININE 6.07 (H) 08/27/2020   GLUCOSE 81 08/27/2020   GFRNONAA 10 (L) 08/27/2020   GFRAA 10 (L) 03/10/2020   CALCIUM 8.8 (L) 08/27/2020   PHOS 4.3 08/26/2020   PROT 6.9 08/24/2020   ALBUMIN 2.8 (L) 08/26/2020   LABGLOB 2.7 03/19/2016   AGRATIO 1.5 03/19/2016   BILITOT 1.0 08/24/2020   ALKPHOS 109 08/24/2020   AST 20 08/24/2020   ALT 13 08/24/2020   ANIONGAP 11 08/27/2020     CBG (last 3)  Recent Labs    08/27/20 0652 08/27/20 1154 08/27/20 1654  GLUCAP 62* 112* 109*     GFR: Estimated Creatinine Clearance: 17.5 mL/min (A) (by C-G formula based on SCr of 6.07 mg/dL (H)).  Coagulation Profile: No results for input(s): INR, PROTIME in the last 168 hours.  Recent Results (from the past 240 hour(s))  SARS Coronavirus 2 by RT PCR (hospital order, performed in [facility] hospital lab) Nasopharyngeal Nasopharyngeal Swab     Status: None   Collection Time: 08/24/20  3:38 PM   Specimen: Nasopharyngeal Swab  Result Value Ref Range Status   SARS Coronavirus 2 NEGATIVE NEGATIVE Final    Comment: (NOTE) SARS-CoV-2 target nucleic acids are NOT DETECTED.  The SARS-CoV-2 RNA is generally detectable in upper and lower respiratory specimens during the acute phase of infection. The lowest concentration of SARS-CoV-2 viral copies this assay can detect is 250 copies / mL. A negative result does not preclude SARS-CoV-2 infection and should not be used as the sole basis for treatment or other patient management decisions.  A negative result may occur with improper specimen collection / handling, submission of specimen other than nasopharyngeal swab,  presence of viral mutation(s) within the areas targeted by this assay, and inadequate number of viral copies (<250 copies / mL). A negative result must be combined with clinical observations, patient history, and epidemiological information.  Fact Sheet for Patients:   StrictlyIdeas.no  Fact Sheet for Healthcare Providers: BankingDealers.co.za  This test is not yet approved or  cleared by the [place] FDA and has been authorized for detection and/or diagnosis of SARS-CoV-2 by FDA under an Emergency Use Authorization (EUA).  This EUA will remain in effect (meaning this test can be used) for the duration of the COVID-19 declaration under Section 564(b)(1) of the  Act, 21 U.S.C. section 360bbb-3(b)(1), unless the authorization is terminated or revoked sooner.  Performed at [facility], [place] [address] [place], [place] [address]   Surgical PCR screen     Status: None   Collection Time: 08/25/20  6:20 AM   Specimen: Nasal Mucosa; Nasal Swab  Result Value Ref Range Status   MRSA, PCR NEGATIVE NEGATIVE Final   Staphylococcus aureus NEGATIVE NEGATIVE Final    Comment: (NOTE) The Xpert SA Assay (FDA approved for NASAL specimens in patients 2 years of age and older), is one component of a comprehensive surveillance program. It is not intended to diagnose infection nor to guide or monitor treatment. Performed at [facility], [place] [address], [place], [place] [address]   Aerobic/Anaerobic Culture (surgical/deep wound)     Status: None (Preliminary result)   Collection Time: 08/25/20 12:48 PM   Specimen: Wound; Abscess  Result Value Ref Range Status   Specimen Description WOUND  Final   Special Requests RIGHT FOOT SPEC A  Final   Gram Stain   Final    FEW WBC PRESENT,BOTH PMN AND MONONUCLEAR RARE GRAM POSITIVE COCCI    Culture   Final    FEW ENTEROCOCCUS FAECALIS RARE PSEUDOMONAS AERUGINOSA NO ANAEROBES ISOLATED; CULTURE IN PROGRESS FOR 5 DAYS CULTURE REINCUBATED FOR BETTER GROWTH Performed at [facility], [place] [address], [place], [place] [address]    Report Status PENDING  Incomplete   Organism ID, Bacteria ENTEROCOCCUS FAECALIS  Final      Susceptibility   Enterococcus faecalis - MIC*    AMPICILLIN <=2 SENSITIVE Sensitive     VANCOMYCIN 1 SENSITIVE Sensitive     GENTAMICIN SYNERGY RESISTANT Resistant     * FEW ENTEROCOCCUS FAECALIS        Radiology Studies: No results found.      Scheduled Meds: . atorvastatin  40 mg Oral QHS  . Chlorhexidine Gluconate Cloth  6 each Topical Q0600  . cinacalcet  30 mg Oral Q supper  . escitalopram  10 mg Oral Daily  . fluticasone  2 spray Each Nare BID  .  glucose      . heparin  5,000 Units Subcutaneous Q8H  . insulin aspart  0-6 Units Subcutaneous TID AC & HS  . metoprolol succinate  50 mg Oral BID  . mirtazapine  7.5 mg Oral QHS  . montelukast  10 mg Oral QHS  . mupirocin ointment  1 application Nasal BID  . pantoprazole  40 mg Oral Daily  . sevelamer carbonate  3,200 mg Oral TID WC  . sodium chloride flush  3 mL Intravenous Q12H  . sodium chloride flush  3 mL Intravenous Q12H  . sodium zirconium cyclosilicate  10 g Oral BID  . vancomycin variable dose per unstable renal function (pharmacist dosing)   Does not apply See admin  instructions   Continuous Infusions: . sodium chloride    . sodium chloride Stopped (08/25/20 1258)  . ciprofloxacin 400 mg (08/27/20 1740)     LOS: 3 days     Blain Pais, MD [facility] 08/27/2020, 6:19 PM  If 7PM-7AM, please contact night-coverage [email or web address]

## 2020-08-27 NOTE — Evaluation (Signed)
Occupational Therapy Evaluation Patient Details Name: Ralph Peterson MRN: [id number] DOB: [birth year] Today's Date: 08/27/2020    History of Present Illness Pt is a 55 y.o. male admitted 08/24/20 with worsening R foot pain. 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 Awaiting ID and vascular sx input. PMH includes CAD, CHF, DM, ESRD, HTN, PAD (s/p 2nd toe amputation), OSA, afib, anxiety.   Clinical Impression   Pt was functioning modified independently prior to admission. He was using a darco shoe on his R foot and reports being up on his feet minimally. Son dressed pt's R foot every other day and children helped with IADL. No specific weight bearing order found s/p I&D, adhered to NWB this visit. Educated pt in ADL adhering to NWB, he is modified independent when when full weight bearing which is what he reports he has been doing since his procedure. Will follow acutely. Message sent to Dr. Posey Pronto to clarify WB status.     Follow Up Recommendations  No OT follow up    Equipment Recommendations  None recommended by OT    Recommendations for Other Services       Precautions / Restrictions Precautions Precautions: Fall Restrictions Other Position/Activity Restrictions: no WB orders, adhered to NWB until specified, pt also with darco shoe in room      Mobility Bed Mobility                    Transfers Overall transfer level: Modified independent Equipment used: Rolling walker (2 wheeled)                  Balance Overall balance assessment: Needs assistance   Sitting balance-Leahy Scale: Good       Standing balance-Leahy Scale: Poor Standing balance comment: reliant on walker to maintain NWB on R foot                           ADL either performed or assessed with clinical judgement   ADL Overall ADL's : Modified independent                                        General ADL Comments: Educated pt in compensatory strategies in the event Dr. Posey Pronto orders pt to be NWB on R foot.     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 w/ wound; lower leg/ankle redness; hip and knee at least 3/5 strength RLE Sensation: history of peripheral neuropathy LLE Deficits / Details: h/o transmet amputation; lower leg/ankle redness; hip and knee at least 3/5 strength LLE Sensation: history of peripheral neuropathy   Cervical / Trunk Assessment Cervical / Trunk Assessment: Normal   Communication Communication Communication: No difficulties   Cognition Arousal/Alertness: Awake/alert Behavior During Therapy: WFL for tasks assessed/performed Overall Cognitive Status: Within Functional Limits for tasks assessed  General Comments       Exercises     Shoulder Instructions      Home Living Family/patient expects to be discharged to:: Private residence Living Arrangements: Children (son and daughter) Available Help at Discharge: Family;Available 24 hours/day (kids work opposite shifts) Type of Home: House Home Access: Stairs to enter [employer] of Steps: 3-4 Entrance Stairs-Rails: None Home Layout: One level     Bathroom Shower/Tub: Teacher, early years/pre: Standard     Home Equipment: Environmental consultant - 2 wheels;Wheelchair - manual   Additional Comments: Lives with children who are supportive      Prior Functioning/Environment Level of Independence: Needs assistance   Gait / Transfers Assistance Needed: Mod indep household ambulation with RW, tries to stay off R foot as much as possible; uses w/c for community distances. Holds onto son on steps out of house, family sets walker at top/bottom. Son drives and assists with dressing changes ADL's / Homemaking Assistance Needed: modified independent in ADL, sponge bathes, son dresses L foot, dependent in housekeeping and meal prep            OT Problem List: Impaired balance (sitting and/or standing);Decreased knowledge of use of DME or AE;Decreased strength      OT Treatment/Interventions: Self-care/ADL training;DME and/or AE instruction;Patient/family education    OT Goals(Current goals can be found in the care plan section) Acute Rehab OT Goals Patient Stated Goal: return home OT Goal Formulation: With patient Time For Goal Achievement: 09/10/20 Potential to Achieve Goals: Good  OT Frequency: Min 2X/week   Barriers to D/C:            Co-evaluation              AM-PAC OT "6 Clicks" Daily Activity     Outcome Measure Help from another person eating meals?: None Help from another person taking care of personal grooming?: None Help from another person toileting, which includes using toliet, bedpan, or urinal?: None Help from another person bathing (including washing, rinsing, drying)?: None Help from another person to put on and taking off regular upper body clothing?: None Help from another person to put on and taking off regular lower body clothing?: None 6 Click Score: 24   End of Session Equipment Utilized During Treatment: Gait belt;Rolling walker  Activity Tolerance: Patient tolerated treatment well Patient left: in chair;with call bell/phone within reach  OT Visit Diagnosis: Other abnormalities of gait and mobility (R26.89);Muscle weakness (generalized) (M62.81)                Time: 1310-1340 OT Time Calculation (min): 30 min Charges:  OT General Charges $OT Visit: 1 Visit OT  Evaluation $OT Eval Low Complexity: 1 Low OT Treatments $Self Care/Home Management : 8-22 mins  Nestor Lewandowsky, OTR/L Acute Rehabilitation Services Pager: [phone] Office: [phone]  [name] 08/27/2020, 1:59 PM

## 2020-08-28 DIAGNOSIS — L02619 Cutaneous abscess of unspecified foot: Secondary | ICD-10-CM | POA: Diagnosis not present

## 2020-08-28 DIAGNOSIS — I7025 Atherosclerosis of native arteries of other extremities with ulceration: Secondary | ICD-10-CM

## 2020-08-28 DIAGNOSIS — L02611 Cutaneous abscess of right foot: Secondary | ICD-10-CM | POA: Diagnosis not present

## 2020-08-28 DIAGNOSIS — I70211 Atherosclerosis of native arteries of extremities with intermittent claudication, right leg: Secondary | ICD-10-CM

## 2020-08-28 DIAGNOSIS — M86171 Other acute osteomyelitis, right ankle and foot: Secondary | ICD-10-CM | POA: Diagnosis not present

## 2020-08-28 LAB — GLUCOSE, CAPILLARY
Glucose-Capillary: 89 mg/dL (ref 70–99)
Glucose-Capillary: 48 mg/dL — ABNORMAL LOW (ref 70–99)
Glucose-Capillary: 67 mg/dL — ABNORMAL LOW (ref 70–99)
Glucose-Capillary: 139 mg/dL — ABNORMAL HIGH (ref 70–99)
Glucose-Capillary: 96 mg/dL (ref 70–99)
Glucose-Capillary: 142 mg/dL — ABNORMAL HIGH (ref 70–99)

## 2020-08-28 LAB — RENAL FUNCTION PANEL
Albumin: 2.7 g/dL — ABNORMAL LOW (ref 3.5–5.0)
Anion gap: 14 (ref 5–15)
BUN: 51 mg/dL — ABNORMAL HIGH (ref 6–20)
CO2: 22 mmol/L (ref 22–32)
Calcium: 8.6 mg/dL — ABNORMAL LOW (ref 8.9–10.3)
Chloride: 92 mmol/L — ABNORMAL LOW (ref 98–111)
Creatinine, Ser: 7.91 mg/dL — ABNORMAL HIGH (ref 0.61–1.24)
GFR, Estimated: 8 mL/min — ABNORMAL LOW (ref >60)
Glucose, Bld: 103 mg/dL — ABNORMAL HIGH (ref 70–99)
Phosphorus: 4.5 mg/dL (ref 2.5–4.6)
Potassium: 6.1 mmol/L — ABNORMAL HIGH (ref 3.5–5.1)
Sodium: 128 mmol/L — ABNORMAL LOW (ref 135–145)

## 2020-08-28 LAB — CBC
HCT: 34.9 % — ABNORMAL LOW (ref 39.0–52.0)
Hemoglobin: 11.3 g/dL — ABNORMAL LOW (ref 13.0–17.0)
MCH: 30.8 pg (ref 26.0–34.0)
MCHC: 32.4 g/dL (ref 30.0–36.0)
MCV: 95.1 fL (ref 80.0–100.0)
Platelets: 114 10*3/uL — ABNORMAL LOW (ref 150–400)
RBC: 3.67 MIL/uL — ABNORMAL LOW (ref 4.22–5.81)
RDW: 18.8 % — ABNORMAL HIGH (ref 11.5–15.5)
WBC: 5.7 10*3/uL (ref 4.0–10.5)
nRBC: 0 % (ref 0.0–0.2)

## 2020-08-28 MED ORDER — VANCOMYCIN HCL IN DEXTROSE 1-5 GM/200ML-% IV SOLN
1000.0000 mg | INTRAVENOUS | Status: DC
  Filled 2020-08-28: qty 200

## 2020-08-28 MED ORDER — HEPARIN SODIUM (PORCINE) 1000 UNIT/ML DIALYSIS
40.0000 [IU]/kg | INTRAMUSCULAR | Status: DC | PRN
  Filled 2020-08-28: qty 4

## 2020-08-28 MED ORDER — VANCOMYCIN HCL IN DEXTROSE 1-5 GM/200ML-% IV SOLN
1000.0000 mg | Freq: Once | INTRAVENOUS | Status: AC
  Administered 2020-08-28: 1000 mg via INTRAVENOUS
  Filled 2020-08-28: qty 200

## 2020-08-28 MED ORDER — JUVEN PO PACK
1.0000 | PACK | Freq: Two times a day (BID) | ORAL | Status: DC
  Administered 2020-08-29: 1 via ORAL
  Filled 2020-08-28: qty 1

## 2020-08-28 MED ORDER — VANCOMYCIN HCL IN DEXTROSE 1-5 GM/200ML-% IV SOLN: 1000.0000 mg | INTRAVENOUS | Status: DC

## 2020-08-28 MED ORDER — RENA-VITE PO TABS
1.0000 | ORAL_TABLET | Freq: Every day | ORAL | Status: DC
  Administered 2020-08-28: 1 via ORAL
  Filled 2020-08-28: qty 1

## 2020-08-28 MED ORDER — LEVOFLOXACIN 500 MG PO TABS: 250.0000 mg | ORAL_TABLET | Freq: Every day | ORAL | Status: DC

## 2020-08-28 MED ORDER — PROSOURCE PLUS PO LIQD
30.0000 mL | Freq: Three times a day (TID) | ORAL | Status: DC
  Administered 2020-08-28 – 2020-08-29 (×2): 30 mL via ORAL
  Filled 2020-08-28 (×2): qty 30

## 2020-08-28 MED ORDER — LEVOFLOXACIN 500 MG PO TABS
250.0000 mg | ORAL_TABLET | Freq: Every day | ORAL | Status: DC
  Administered 2020-08-28: 250 mg via ORAL
  Filled 2020-08-28: qty 1

## 2020-08-28 NOTE — Progress Notes (Addendum)
Pharmacy Antibiotic Note  Ralph Peterson is a 55 y.o. male admitted on 08/24/2020 with wound infection of the right foot s/p I&D. Wound cultures positive for pseudomonas aeruginosa and enterococcus faecalis.  Pharmacy has been consulted for levofloxacin and vancomycin dosing dosing. Patient is HD MWF. Spoke with patient regarding PCN allergy and confirmed patient has experienced tongue swelling in the past on both penicillin and amoxicillin. Will treat for 1 more week per ID, last day of therapy will be 2/11.  Plan: Vancomycin '1000mg'$  IV x1 today after HD Vancomycin '1000mg'$  IV QHD MWF starting 2/9 Levofloxacin '250mg'$  daily  Monitor clinical status and HD scheduled compliance  Height: 6' (182.9 cm) Weight: 102.9 kg (226 lb 13.7 oz) IBW/kg (Calculated) : 77.6  Temp (24hrs), Avg:99.1 F (37.3 C), Min:98.6 F (37 C), Max:99.8 F (37.7 C)  Recent Labs  Lab 08/22/20 1727 08/24/20 1500 08/25/20 0412 08/25/20 1155 08/26/20 0942 08/27/20 0234 08/28/20 0345 08/28/20 0656  WBC 6.0 6.0 6.2  --  6.2 4.6  --  5.7  CREATININE 5.44* 4.41* 5.58* 5.70* 7.36* 6.07* 7.91*  --   LATICACIDVEN 1.2 1.9  --   --   --   --   --   --     Estimated Creatinine Clearance: 13.6 mL/min (A) (by C-G formula based on SCr of 7.91 mg/dL (H)).    Allergies  Allergen Reactions  . Oxycodone-Acetaminophen Itching  . Gabapentin Other (See Comments)    unknown  . Tramadol Other (See Comments)    unknown  . Penicillins Swelling and Rash    Has patient had a PCN reaction causing immediate rash, facial/tongue/throat swelling, SOB or lightheadedness with hypotension: Yes Has patient had a PCN reaction causing severe rash involving mucus membranes or skin necrosis: Yes Has patient had a PCN reaction that required hospitalization: No Has patient had a PCN reaction occurring within the last 10 years: No If all of the above answers are "NO", then may proceed with Cephalosporin use.     Antimicrobials this  admission: 1/21 PTA Bactrim >>2/3 2/3 Vanc >>        - 2/3: Vanc 2g        - 2/5: Vanc 1g before HD 2/3 Cipro >>2/6  Dose adjustments this admission: NA   Microbiology results: 1/20 RLE Wound cx: + pseudomonas (PanS), E faecalis (Amp-S) 2/4 MRSA: neg  2/4 Wound Cx: rare GPC, pending  Thank you for allowing pharmacy to be a part of this patient's care.  Cephus Slater, PharmD, [facility] Resident [phone] 08/28/2020 11:29 AM

## 2020-08-28 NOTE — Progress Notes (Signed)
[facility]                                                                              Patient Demographics  Ralph Peterson, is a 55 y.o. male, DOB - 07/20/1966, [id number]  Admit date - 08/24/2020   Admitting Physician Eugenie Filler, MD  Outpatient Primary MD for the patient is Vidal Schwalbe, MD  Outpatient specialists:   LOS - 4  days   Medical records reviewed and are as summarized below:    Chief Complaint  Patient presents with  . Foot Pain       Brief summary   Ralph Peterson is a 55 y.o. male with A. 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 Posey Pronto and possible debridement. Patient underwent I&D of the right foot with bone biopsy on 2/4.  Cultures however showed Enterococcus and Pseudomonas.   ID consulted on 2/7    Assessment & Plan    Principal Problem: Right foot plantar abscess, diabetic foot ulcer -Patient was sent by podiatry for IV antibiotics and surgery. -  Underwent irrigation and debridement on 2/4, by Dr. Boneta Lucks -Wound cultures from 1/20 showed E faecalis.  Wound cultures from 08/25/2020 showed Enterococcus faecalis, Pseudomonas aeruginosa -ID consulted, appreciate ID recommendations, recommended vancomycin with dialysis and levofloxacin for coverage for 7 more days   Active Problems:  ESRD on hemodialysis MWF -Seen during dialysis today, tolerating dialysis well. -Will need to arrange vancomycin with dialysis at his outpatient dialysis center, nephrology following  Diabetes mellitus type 2, uncontrolled with diabetic nephropathy, foot ulcer -Patient was on Levemir and SSI, had hypoglycemia and Levemir insulin was  discontinued -Hemoglobin A1c 6.7 on 2/4  Anemia of chronic disease -H&H currently stable  Essential hypertension -BP currently stable  Pressure injury, mid coccyx stage I, POA -Continue wound care per nursing  Atrial fibrillation, chronic -Rate controlled, continue beta-blocker -Continue eliquis  Hyperlipidemia -Continue statin   Obesity Estimated body mass index is 30.77 kg/m as calculated from the following:   Height as of this encounter: 6' (1.829 m).   Weight as of this encounter: 102.9 kg.  Code Status: Full CODE STATUS DVT Prophylaxis:   apixaban (ELIQUIS) tablet 5 mg   Level of Care: Level of care: Med-Surg Family Communication: Discussed all imaging results, lab results, explained to the patient    Disposition Plan:     Status is: Inpatient  Remains inpatient appropriate because:Inpatient level of care appropriate due to severity of illness   Dispo: The patient is from: Home              Anticipated d/c is to: Home              Anticipated d/c date is: 1 day              Patient currently is not medically stable to d/c.  Difficult to place patient No      Time Spent in minutes 35 minutes  Procedures:  I&D  Consultants:   Podiatry Infectious disease Nephrology  Antimicrobials:   Anti-infectives (From admission, onward)   Start     Dose/Rate Route Frequency Ordered Stop   09/01/20 1700  levofloxacin (LEVAQUIN) tablet 250 mg        250 mg Oral Daily 08/28/20 1127     08/30/20 1200  vancomycin (VANCOCIN) IVPB 1000 mg/200 mL premix        1,000 mg 200 mL/hr over 60 Minutes Intravenous Every M-W-F (Hemodialysis) 08/28/20 1132 09/06/20 1159   08/28/20 1700  levofloxacin (LEVAQUIN) tablet 250 mg  Status:  Discontinued        250 mg Oral Daily 08/28/20 1122 08/28/20 1127   08/28/20 1230  vancomycin (VANCOCIN) IVPB 1000 mg/200 mL premix        1,000 mg 200 mL/hr over 60 Minutes Intravenous  Once 08/28/20 1132 08/28/20 1433   08/28/20 1200   vancomycin (VANCOCIN) IVPB 1000 mg/200 mL premix  Status:  Discontinued        1,000 mg 200 mL/hr over 60 Minutes Intravenous Every M-W-F (Hemodialysis) 08/28/20 1122 08/28/20 1132   08/27/20 1200  ampicillin (OMNIPEN) 2 g in sodium chloride 0.9 % 100 mL IVPB  Status:  Discontinued        2 g 300 mL/hr over 20 Minutes Intravenous Every 24 hours 08/27/20 1020 08/27/20 1033   08/27/20 1054  vancomycin variable dose per unstable renal function (pharmacist dosing)  Status:  Discontinued         Does not apply See admin instructions 08/27/20 1054 08/28/20 1122   08/26/20 1200  vancomycin (VANCOCIN) IVPB 1000 mg/200 mL premix        1,000 mg 200 mL/hr over 60 Minutes Intravenous Every T-Th-Sa (Hemodialysis) 08/26/20 0947 08/26/20 1152   08/26/20 0952  vancomycin (VANCOCIN) 1-5 GM/200ML-% IVPB       Note to Pharmacy: Judieth Keens  : cabinet override      08/26/20 0952 08/26/20 1117   08/25/20 1700  ciprofloxacin (CIPRO) IVPB 400 mg  Status:  Discontinued        400 mg 200 mL/hr over 60 Minutes Intravenous Every 24 hours 08/24/20 1908 08/28/20 1122   08/24/20 1915  vancomycin (VANCOREADY) IVPB 2000 mg/400 mL        2,000 mg 200 mL/hr over 120 Minutes Intravenous  Once 08/24/20 1911 08/24/20 2138   08/24/20 1913  vancomycin variable dose per unstable renal function (pharmacist dosing)  Status:  Discontinued         Does not apply See admin instructions 08/24/20 1913 08/27/20 1020   08/24/20 1645  clindamycin (CLEOCIN) IVPB 600 mg        600 mg 100 mL/hr over 30 Minutes Intravenous  Once 08/24/20 1637 08/24/20 1725   08/24/20 1645  ciprofloxacin (CIPRO) IVPB 400 mg        400 mg 200 mL/hr over 60 Minutes Intravenous  Once 08/24/20 1637 08/24/20 1900          Medications  Scheduled Meds: . apixaban  5 mg Oral BID  . atorvastatin  40 mg Oral QHS  . Chlorhexidine Gluconate Cloth  6 each Topical Q0600  . cinacalcet  30 mg Oral Q supper  . escitalopram  10 mg Oral Daily  .  fluticasone  2 spray Each Nare BID  . insulin aspart  0-6 Units Subcutaneous TID AC & HS  . [  START ON 09/01/2020] levofloxacin  250 mg Oral Daily  . metoprolol succinate  50 mg Oral BID  . mirtazapine  7.5 mg Oral QHS  . montelukast  10 mg Oral QHS  . mupirocin ointment  1 application Nasal BID  . pantoprazole  40 mg Oral Daily  . sevelamer carbonate  3,200 mg Oral TID WC  . sodium chloride flush  3 mL Intravenous Q12H  . sodium chloride flush  3 mL Intravenous Q12H   Continuous Infusions: . sodium chloride 250 mL (08/28/20 1331)  . sodium chloride Stopped (08/25/20 1258)  . [START ON 08/30/2020] vancomycin     PRN Meds:.sodium chloride, albuterol, guaiFENesin, hydrALAZINE, HYDROmorphone (DILAUDID) injection, levalbuterol, loperamide, ondansetron **OR** ondansetron (ZOFRAN) IV, senna-docusate, sevelamer carbonate, sodium chloride flush, sorbitol, traMADol      Subjective:   Ralph Peterson was seen and examined today.  Seen during hemodialysis, no fevers or chills.  No acute issues overnight.  Pain controlled.  Patient denies dizziness, chest pain, shortness of breath, abdominal pain, N/V/D/C, new weakness, numbess, tingling.     Objective:   Vitals:   08/28/20 1055 08/28/20 1131 08/28/20 1320 08/28/20 1335  BP: 126/80 121/77 118/78   Pulse: 69 74 75 77  Resp: '18 20 16   '$ Temp: 98.9 F (37.2 C) 98.3 F (36.8 C) 99.2 F (37.3 C)   TempSrc: Oral Oral    SpO2:  100% 100% 100%  Weight:      Height:        Intake/Output Summary (Last 24 hours) at 08/28/2020 1503 Last data filed at 08/28/2020 1134 Gross per 24 hour  Intake 483 ml  Output 2500 ml  Net -2017 ml     Wt Readings from Last 3 Encounters:  08/28/20 102.9 kg  08/15/20 99.8 kg  08/10/20 97.5 kg     Exam  General: Alert and oriented x 3, NAD  Cardiovascular: S1 S2 auscultated, no murmurs, RRR  Respiratory: Clear to auscultation bilaterally  Gastrointestinal: Soft, nontender, nondistended, + bowel  sounds  Ext: right foot dressing intact, left TMA  Neuro: no new deficits  Musculoskeletal: No digital cyanosis, clubbing  Skin: No rashes  Psych: Normal affect and demeanor, alert and oriented x3    Data Reviewed:  I have personally reviewed following labs and imaging studies  Micro Results Recent Results (from the past 240 hour(s))  SARS Coronavirus 2 by RT PCR (hospital order, performed in [place] hospital lab) Nasopharyngeal Nasopharyngeal Swab     Status: None   Collection Time: 08/24/20  3:38 PM   Specimen: Nasopharyngeal Swab  Result Value Ref Range Status   SARS Coronavirus 2 NEGATIVE NEGATIVE Final    Comment: (NOTE) SARS-CoV-2 target nucleic acids are NOT DETECTED.  The SARS-CoV-2 RNA is generally detectable in upper and lower respiratory specimens during the acute phase of infection. The lowest concentration of SARS-CoV-2 viral copies this assay can detect is 250 copies / mL. A negative result does not preclude SARS-CoV-2 infection and should not be used as the sole basis for treatment or other patient management decisions.  A negative result may occur with improper specimen collection / handling, submission of specimen other than nasopharyngeal swab, presence of viral mutation(s) within the areas targeted by this assay, and inadequate number of viral copies (<250 copies / mL). A negative result must be combined with clinical observations, patient history, and epidemiological information.  Fact Sheet for Patients:   StrictlyIdeas.no  Fact Sheet for Healthcare Providers: BankingDealers.co.za  This test is not  yet approved or  cleared by the [place] and has been authorized for detection and/or diagnosis of SARS-CoV-2 by FDA under an Emergency Use Authorization (EUA).  This EUA will remain in effect (meaning this test can be used) for the duration of the COVID-19 declaration under Section 564(b)(1) of  the Act, 21 U.S.C. section 360bbb-3(b)(1), unless the authorization is terminated or revoked sooner.  Performed at [facility], [place] [address] [place], [place] [address]   Surgical PCR screen     Status: None   Collection Time: 08/25/20  6:20 AM   Specimen: Nasal Mucosa; Nasal Swab  Result Value Ref Range Status   MRSA, PCR NEGATIVE NEGATIVE Final   Staphylococcus aureus NEGATIVE NEGATIVE Final    Comment: (NOTE) The Xpert SA Assay (FDA approved for NASAL specimens in patients 30 years of age and older), is one component of a comprehensive surveillance program. It is not intended to diagnose infection nor to guide or monitor treatment. Performed at [facility], [place] [address] [place], [place] [address]   Aerobic/Anaerobic Culture (surgical/deep wound)     Status: None (Preliminary result)   Collection Time: 08/25/20 12:48 PM   Specimen: Wound; Abscess  Result Value Ref Range Status   Specimen Description WOUND  Final   Special Requests RIGHT FOOT SPEC A  Final   Gram Stain   Final    FEW WBC PRESENT,BOTH PMN AND MONONUCLEAR RARE GRAM POSITIVE COCCI Performed at [facility], [place] [address], [place], [place] [address]    Culture   Final    FEW ENTEROCOCCUS FAECALIS RARE PSEUDOMONAS AERUGINOSA SUSCEPTIBILITIES TO FOLLOW NO ANAEROBES ISOLATED; CULTURE IN PROGRESS FOR 5 DAYS    Report Status PENDING  Incomplete   Organism ID, Bacteria ENTEROCOCCUS FAECALIS  Final      Susceptibility   Enterococcus faecalis - MIC*    AMPICILLIN <=2 SENSITIVE Sensitive     VANCOMYCIN 1 SENSITIVE Sensitive     GENTAMICIN SYNERGY RESISTANT Resistant     * FEW ENTEROCOCCUS FAECALIS    Radiology Reports MR FOOT RIGHT W WO CONTRAST  Result Date: 08/22/2020 CLINICAL DATA:  History of amputations with a nonhealing wound. EXAM: MRI OF THE RIGHT FOREFOOT WITHOUT AND WITH CONTRAST TECHNIQUE: Multiplanar, multisequence MR imaging of the right foot was performed  before and after the administration of intravenous contrast. CONTRAST:  23m GADAVIST GADOBUTROL 1 MMOL/ML IV SOLN COMPARISON:  Radiographs 04/18/2020 FINDINGS: Evidence of prior amputations of the second and third toes. The second metatarsal is amputated down to the proximal third and the fourth metatarsal is amputated at the neck region. There is mild T1 and T2 signal abnormality at the amputation site of the second metatarsal. Is also slight enhancement. Could not exclude early/mild changes of osteomyelitis. There is an open wound on the plantar aspect of the forefoot located in the region of the second MTP joint. This communicates with a rim enhancing abscess which measures approximately 17 x 17 x 17 mm and contains gas. Surrounding moderate enhancing inflammatory changes. I do not see any findings suspicious for septic arthritis and no other definite findings for osteomyelitis. Mild changes of cellulitis and myositis. IMPRESSION: 1. Open wound on the plantar aspect of the forefoot located in the region of the second MTP joint. This communicates with a rim enhancing abscess which contains gas. 2. Mild T1 and T2 signal abnormality and enhancement at the amputation site of the second metatarsal. Could not exclude early/mild changes of  osteomyelitis. 3. Evidence of prior amputations of the second and third toes and metatarsals. 4. Mild changes of cellulitis and myositis. Electronically Signed   By: Marijo Sanes M.D.   On: 08/22/2020 11:27   DG Foot Complete Right  Result Date: 08/22/2020 CLINICAL DATA:  Wound EXAM: RIGHT FOOT COMPLETE - 3+ VIEW COMPARISON:  04/18/2020 FINDINGS: Post amputation of second and third digits through the metatarsals. There are no erosive changes identified. There is lucency overlying the expected location of the head of the second metatarsal. There is additional lucency along the lateral aspect of the foot at the level of the distal fifth metatarsal. IMPRESSION: Lucency overlying the  expected location of the head of the second metatarsal. This likely reflects soft tissue gas within the abscess noted on MRI. Additional lucency along the lateral aspect of the foot at the level of the distal fifth metatarsal may reflect additional soft tissue gas. No erosive changes to suggest osteomyelitis. Electronically Signed   By: Macy Mis M.D.   On: 08/22/2020 18:21   VAS [place] ABI WITH/WO TBI  Result Date: 08/15/2020 LOWER EXTREMITY DOPPLER STUDY Indications: Ulceration, and peripheral artery disease.  Vascular Interventions: 02/01/2020: PTA and Stent placement Right SFA and                         Popliyeal Artery. Mechanical Thrombectomy Right SFA. Comparison Study: 05/04/2020 Performing Technologist: Almira Coaster RVS  Examination Guidelines: A complete evaluation includes at minimum, Doppler waveform signals and systolic blood pressure reading at the level of bilateral brachial, anterior tibial, and posterior tibial arteries, when vessel segments are accessible. Bilateral testing is considered an integral part of a complete examination. Photoelectric Plethysmograph (PPG) waveforms and toe systolic pressure readings are included as required and additional duplex testing as needed. Limited examinations for reoccurring indications may be performed as noted.  ABI Findings: +-----+------------------+-----+----------+--------+ RightRt Pressure (mmHg)IndexWaveform  Comment  +-----+------------------+-----+----------+--------+ ATA  250                    monophasicNC       +-----+------------------+-----+----------+--------+ PTA  250                    monophasicNC       +-----+------------------+-----+----------+--------+ +----+------------------+-----+----------+-------+ LeftLt Pressure (mmHg)IndexWaveform  Comment +----+------------------+-----+----------+-------+ ATA 243                    monophasicNC      +----+------------------+-----+----------+-------+ PTA 240                     monophasicNC      +----+------------------+-----+----------+-------+ +-------+-----------+-----------+------------+------------+ ABI/TBIToday's ABIToday's TBIPrevious ABIPrevious TBI +-------+-----------+-----------+------------+------------+ Right  >1.0 [place]               >1.0 [place]                  +-------+-----------+-----------+------------+------------+ Left   >1.0 [place]               >1.0 [place]                  +-------+-----------+-----------+------------+------------+ Bilateral ABIs appear essentially unchanged compared to prior study on 05/04/2020.  Summary: Right: Resting right ankle-brachial index indicates noncompressible right lower extremity arteries. Left: Resting left ankle-brachial index indicates noncompressible left lower extremity arteries.  *See table(s) above for measurements and observations.  Electronically signed by Hortencia Pilar MD on 08/15/2020 at 9:44:33 AM.   Final  VAS US DUPLEX DIALYSIS ACCESS (AVF,AVG)  Result Date: 08/15/2020 DIALYSIS ACCESS Access Site: Right Upper Extremity. Access Type: Radial-cephalic AVF. Comparison Study: 01/21/2019 Performing Technologist: Almira Coaster RVS  Examination Guidelines: A complete evaluation includes B-mode imaging, spectral Doppler, color Doppler, and power Doppler as needed of all accessible portions of each vessel. Unilateral testing is considered an integral part of a complete examination. Limited examinations for reoccurring indications may be performed as noted.  Findings: +--------------------+----------+-----------------+--------+ AVF                 PSV (cm/s)Flow Vol (mL/min)Comments +--------------------+----------+-----------------+--------+ Native artery inflow    60           962                +--------------------+----------+-----------------+--------+ AVF Anastomosis        147                              +--------------------+----------+-----------------+--------+   +------------+----------+-------------+----------+------------------+ OUTFLOW VEINPSV (cm/s)Diameter (cm)Depth (cm)     Describe      +------------+----------+-------------+----------+------------------+ AC Fossa       134                                              +------------+----------+-------------+----------+------------------+ Prox Forearm    56                           change in Diameter +------------+----------+-------------+----------+------------------+ Mid Forearm     27                           change in Diameter +------------+----------+-------------+----------+------------------+ Dist Forearm   631                                              +------------+----------+-------------+----------+------------------+  +--------------+-------------+---------+---------+----------+------------------+               Diameter (cm)  Depth  BranchingPSV (cm/s)   Flow Volume                                  (cm)                           (ml/min)      +--------------+-------------+---------+---------+----------+------------------+ Rt Rad Art                                       51                       Dist                                                                      +--------------+-------------+---------+---------+----------+------------------+  Summary: The Right Radial Cephalic AVF appears to be patent; Flow Volume appears to be normal. Elevated Velocities seen in the Distal Forearm area of the AVF just beyond the Anastomosis.  *See table(s) above for measurements and observations.  Diagnosing physician: Hortencia Pilar MD Electronically signed by Hortencia Pilar MD on 08/15/2020 at 9:44:40 AM.   --------------------------------------------------------------------------------   Final    VAS [place] LOWER EXTREMITY ARTERIAL DUPLEX  Result Date: 08/15/2020 LOWER EXTREMITY ARTERIAL DUPLEX STUDY Indications: Ulceration, and peripheral artery  disease.  Vascular Interventions: 02/01/2020: PTA and Stent placement Right SFA and                         Popliyeal Artery. Mechanical Thrombectomy Right SFA. Current ABI:            Rt >1.0 [place], Lt >1.0 [place] Comparison Study: 01/27/2020 Performing Technologist: Almira Coaster RVS  Examination Guidelines: A complete evaluation includes B-mode imaging, spectral Doppler, color Doppler, and power Doppler as needed of all accessible portions of each vessel. Bilateral testing is considered an integral part of a complete examination. Limited examinations for reoccurring indications may be performed as noted.  +-----------+--------+-----+--------+----------+--------+ RIGHT      PSV cm/sRatioStenosisWaveform  Comments +-----------+--------+-----+--------+----------+--------+ CFA Distal 63                   monophasic         +-----------+--------+-----+--------+----------+--------+ DFA        73                   biphasic           +-----------+--------+-----+--------+----------+--------+ SFA Prox   97                   monophasic         +-----------+--------+-----+--------+----------+--------+ SFA Mid    96                   monophasic         +-----------+--------+-----+--------+----------+--------+ SFA Distal 63                   monophasic         +-----------+--------+-----+--------+----------+--------+ POP Distal 39                   monophasic         +-----------+--------+-----+--------+----------+--------+ ATA Distal 57                   monophasic         +-----------+--------+-----+--------+----------+--------+ PTA Distal 53                   monophasic         +-----------+--------+-----+--------+----------+--------+ PERO Distal87                   monophasic         +-----------+--------+-----+--------+----------+--------+  +-----------+--------+-----+--------+----------+--------+ LEFT       PSV cm/sRatioStenosisWaveform  Comments  +-----------+--------+-----+--------+----------+--------+ CFA Distal 71                   monophasic         +-----------+--------+-----+--------+----------+--------+ DFA        53                   triphasic          +-----------+--------+-----+--------+----------+--------+ SFA Prox   68  monophasic         +-----------+--------+-----+--------+----------+--------+ SFA Mid    68                   monophasic         +-----------+--------+-----+--------+----------+--------+ SFA Distal 113                  monophasic         +-----------+--------+-----+--------+----------+--------+ POP Distal 153                  monophasic         +-----------+--------+-----+--------+----------+--------+ ATA Distal 29                   monophasic         +-----------+--------+-----+--------+----------+--------+ PTA Distal 48                   monophasic         +-----------+--------+-----+--------+----------+--------+ PERO Distal14                   monophasic         +-----------+--------+-----+--------+----------+--------+  Summary: Right: Imaging and Waveforms obtained throughout in the Right Lower Extremity. Monophasic Waveforms seen predominantly in the Right Lower Extremity.  Left: Imaging and Waveforms obtained throughout in the Left Lower Extremity. Monophasic Waveforms seen predominantly in the Left Lower Extremity.  See table(s) above for measurements and observations. Electronically signed by Hortencia Pilar MD on 08/15/2020 at 9:41:32 AM.    Final     Lab Data:  CBC: Recent Labs  Lab 08/22/20 1727 08/24/20 1500 08/25/20 0412 08/25/20 1155 08/26/20 0942 08/27/20 0234 08/28/20 0656  WBC 6.0 6.0 6.2  --  6.2 4.6 5.7  NEUTROABS 3.7 3.9 3.9  --   --  2.9  --   HGB 11.9* 12.5* 11.3* 12.9* 11.2* 11.2* 11.3*  HCT 38.0* 39.2 35.4* 38.0* 33.8* 33.8* 34.9*  MCV 96.4 96.3 95.2  --  93.9 94.7 95.1  PLT 135* 142* 129*  --  121* 110* 114*    Basic Metabolic Panel: Recent Labs  Lab 08/24/20 1500 08/25/20 0412 08/25/20 1155 08/26/20 0942 08/27/20 0234 08/28/20 0345  NA 136 134* 131* 129* 131* 128*  K 5.0 5.0 5.3* 6.3* 5.5* 6.1*  CL 93* 94* 94* 91* 96* 92*  CO2 29 27  --  '22 24 22  '$ GLUCOSE 140* 117* 129* 79 81 103*  BUN 33* 37* 39* 56* 36* 51*  CREATININE 4.41* 5.58* 5.70* 7.36* 6.07* 7.91*  CALCIUM 9.3 9.2  --  9.1 8.8* 8.6*  PHOS  --  4.1  --  4.3  --  4.5   GFR: Estimated Creatinine Clearance: 13.6 mL/min (A) (by C-G formula based on SCr of 7.91 mg/dL (H)). Liver Function Tests: Recent Labs  Lab 08/22/20 1727 08/24/20 1500 08/25/20 0412 08/26/20 0942 08/28/20 0345  AST 18 20  --   --   --   ALT 13 13  --   --   --   ALKPHOS 107 109  --   --   --   BILITOT 1.0 1.0  --   --   --   PROT 6.5 6.9  --   --   --   ALBUMIN 2.9* 3.2* 2.7* 2.8* 2.7*   No results for input(s): LIPASE, AMYLASE in the last 168 hours. No results for input(s): AMMONIA in the last 168 hours. Coagulation Profile: No results for input(s): INR, PROTIME in the last  168 hours. Cardiac Enzymes: No results for input(s): CKTOTAL, CKMB, CKMBINDEX, TROPONINI in the last 168 hours. BNP (last 3 results) No results for input(s): PROBNP in the last 8760 hours. HbA1C: No results for input(s): HGBA1C in the last 72 hours. CBG: Recent Labs  Lab 08/27/20 2008 08/28/20 0631 08/28/20 1134 08/28/20 1212 08/28/20 1338  GLUCAP 162* 89 48* 67* 139*   Lipid Profile: No results for input(s): CHOL, HDL, LDLCALC, TRIG, CHOLHDL, LDLDIRECT in the last 72 hours. Thyroid Function Tests: No results for input(s): TSH, T4TOTAL, FREET4, T3FREE, THYROIDAB in the last 72 hours. Anemia Panel: No results for input(s): VITAMINB12, FOLATE, FERRITIN, TIBC, IRON, RETICCTPCT in the last 72 hours. Urine analysis: No results found for: COLORURINE, APPEARANCEUR, LABSPEC, PHURINE, GLUCOSEU, HGBUR, BILIRUBINUR, KETONESUR, PROTEINUR, UROBILINOGEN, NITRITE,  LEUKOCYTESUR   Tata Timmins M.D. [facility] 08/28/2020, 3:03 PM   Call night coverage person covering after 7pm

## 2020-08-28 NOTE — Plan of Care (Signed)
  Problem: Education: Goal: Knowledge of General Education information will improve Description: Including pain rating scale, medication(s)/side effects and non-pharmacologic comfort measures Outcome: Progressing   Problem: Clinical Measurements: Goal: Respiratory complications will improve Outcome: Progressing Note: On room air   Problem: Activity: Goal: Risk for activity intolerance will decrease Outcome: Progressing Note: Walked in hallway with PT with walker and 1 assist   Problem: Nutrition: Goal: Adequate nutrition will be maintained Outcome: Progressing   Problem: Coping: Goal: Level of anxiety will decrease Outcome: Progressing   Problem: Pain Managment: Goal: General experience of comfort will improve Outcome: Progressing Note: No complaints of apin   Problem: Safety: Goal: Ability to remain free from injury will improve Outcome: Progressing

## 2020-08-28 NOTE — Progress Notes (Signed)
Physical Therapy Treatment Patient Details Name: Ralph Peterson MRN: [id number] DOB: 1966-06-21 Today's Date: 08/28/2020    History of Present Illness Pt is a 55 y.o. male admitted 08/24/20 with worsening R foot pain. 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 Awaiting ID and vascular sx input. PMH includes CAD, CHF, DM, ESRD, HTN, PAD (s/p 2nd toe amputation), OSA, afib, anxiety.    PT Comments    Pt progressing well towards all goals. Pt able to safely amb and complete stair negotiation in darco shoe on R LE. PT to continue to monitor pt while in hospital.    Follow Up Recommendations  No PT follow up     Equipment Recommendations  None recommended by PT    Recommendations for Other Services       Precautions / Restrictions Precautions Precautions: Fall Required Braces or Orthoses: Other Brace Other Brace: R darco shoe Restrictions Weight Bearing Restrictions: Yes RLE Weight Bearing: Weight bearing as tolerated (through heel only in darco shoe) Other Position/Activity Restrictions: per MD still maintain NWB majority of time but can WBAT through heel in darco if needed    Mobility  Bed Mobility Overal bed mobility: Modified Independent             General bed mobility comments: HOB elevated  Transfers Overall transfer level: Modified independent Equipment used: Rolling walker (2 wheeled)             General transfer comment: increased time and effort due to lower surface height but steady  Ambulation/Gait Ambulation/Gait assistance: Modified independent (Device/Increase time) Gait Distance (Feet): 120 Feet Assistive device: Rolling walker (2 wheeled) Gait Pattern/deviations: Step-to pattern Gait velocity: dec   General Gait Details: ambulated to stairs and back, discussed wearing shoe on L foot to even out leg length   Stairs Stairs:  Yes Stairs assistance: Min guard Stair Management: One rail Right;Step to pattern;Forwards Number of Stairs: 3 (to mimic home set up) General stair comments: verbal cues for sequencing "up with the good, down with the bad"   Wheelchair Mobility    Modified Rankin (Stroke Patients Only)       Balance Overall balance assessment: Needs assistance   Sitting balance-Leahy Scale: Good       Standing balance-Leahy Scale: Poor Standing balance comment: reliant on walker to maintain NWB on R foot                            Cognition Arousal/Alertness: Awake/alert Behavior During Therapy: WFL for tasks assessed/performed Overall Cognitive Status: Within Functional Limits for tasks assessed                                        Exercises      General Comments General comments (skin integrity, edema, etc.): pt with ace wrap and dressing on R foot      Pertinent Vitals/Pain Pain Assessment: No/denies pain    Home Living                      Prior Function            PT Goals (current goals can now be found in the care plan section) Progress towards PT goals: Progressing toward goals    Frequency  Min 3X/week      PT Plan Current plan remains appropriate    Co-evaluation              AM-PAC PT "6 Clicks" Mobility   Outcome Measure  Help needed turning from your back to your side while in a flat bed without using bedrails?: None Help needed moving from lying on your back to sitting on the side of a flat bed without using bedrails?: None Help needed moving to and from a bed to a chair (including a wheelchair)?: None Help needed standing up from a chair using your arms (e.g., wheelchair or bedside chair)?: None Help needed to walk in hospital room?: None Help needed climbing 3-5 steps with a railing? : A Little 6 Click Score: 23    End of Session Equipment Utilized During Treatment: Other (comment) (Darco shoe on  R) Activity Tolerance: Patient tolerated treatment well Patient left: in bed;with call bell/phone within reach Nurse Communication: Mobility status PT Visit Diagnosis: Other abnormalities of gait and mobility (R26.89);Pain     Time: [id number] PT Time Calculation (min) (ACUTE ONLY): 13 min  Charges:  $Gait Training: 8-22 mins                     Kittie Plater, PT, DPT Acute Rehabilitation Services Pager #: [phone] Office #: [phone]    Berline Lopes 08/28/2020, 2:22 PM

## 2020-08-28 NOTE — Progress Notes (Signed)
Occupational Therapy Treatment Patient Details Name: Ralph Peterson MRN: [id number] DOB: 20-Dec-1965 Today's Date: 08/28/2020    History of present illness Pt is a 55 y.o. male admitted 08/24/20 with worsening R foot pain. 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 Awaiting ID and vascular sx input. PMH includes CAD, CHF, DM, ESRD, HTN, PAD (s/p 2nd toe amputation), OSA, afib, anxiety.   OT comments  Pt progressing well s/p surgery. OT will continue to follow acutely to maximize level of function and safety  Follow Up Recommendations  No OT follow up    Equipment Recommendations  None recommended by OT    Recommendations for Other Services      Precautions / Restrictions Precautions Precautions: Fall Required Braces or Orthoses: Other Brace Other Brace: R darco shoe Restrictions Weight Bearing Restrictions: Yes RLE Weight Bearing: Weight bearing as tolerated Other Position/Activity Restrictions: per MD still maintain NWB majority of time but can WBAT through heel in darco if needed       Mobility Bed Mobility Overal bed mobility: Modified Independent             General bed mobility comments: HOB elevated  Transfers Overall transfer level: Modified independent Equipment used: Rolling walker (2 wheeled)             General transfer comment: increased time and effort due to lower surface height but steady    Balance Overall balance assessment: Needs assistance Sitting-balance support: No upper extremity supported Sitting balance-Leahy Scale: Good     Standing balance support: During functional activity;Bilateral upper extremity supported Standing balance-Leahy Scale: Poor Standing balance comment: reliant on walker to maintain NWB on R foot                           ADL either performed or assessed with clinical judgement   ADL  Overall ADL's : Needs assistance/impaired Eating/Feeding: Independent   Grooming: Wash/dry hands;Wash/dry face;Supervision/safety;Standing   Upper Body Bathing: Set up       Upper Body Dressing : Set up   Lower Body Dressing: Supervision/safety;Sitting/lateral leans;Sit to/from stand   Toilet Transfer: Modified Independent;Ambulation;RW;Comfort height toilet;Grab bars   Toileting- Clothing Manipulation and Hygiene: Supervision/safety;Sit to/from stand       Functional mobility during ADLs: Modified independent General ADL Comments: reviewed compensatory LB ADL strategies     Vision Patient Visual Report: No change from baseline     Perception     Praxis      Cognition Arousal/Alertness: Awake/alert Behavior During Therapy: WFL for tasks assessed/performed Overall Cognitive Status: Within Functional Limits for tasks assessed                                          Exercises     Shoulder Instructions       General Comments pt with ace wrap and dressing on R foot    Pertinent Vitals/ Pain       Pain Assessment: No/denies pain Faces Pain Scale: No hurt Pain Intervention(s): Monitored during session  Home Living  Prior Functioning/Environment              Frequency  Min 2X/week        Progress Toward Goals  OT Goals(current goals can now be found in the care plan section)  Progress towards OT goals: Progressing toward goals  Acute Rehab OT Goals Patient Stated Goal: return home  Plan Discharge plan remains appropriate    Co-evaluation                 AM-PAC OT "6 Clicks" Daily Activity     Outcome Measure   Help from another person eating meals?: None Help from another person taking care of personal grooming?: None Help from another person toileting, which includes using toliet, bedpan, or urinal?: None Help from another person bathing (including washing,  rinsing, drying)?: A Little Help from another person to put on and taking off regular upper body clothing?: None Help from another person to put on and taking off regular lower body clothing?: A Little 6 Click Score: 22    End of Session Equipment Utilized During Treatment: Gait belt;Rolling walker  OT Visit Diagnosis: Other abnormalities of gait and mobility (R26.89);Muscle weakness (generalized) (M62.81)   Activity Tolerance Patient tolerated treatment well   Patient Left with call bell/phone within reach;in bed   Nurse Communication          Time: [id number] OT Time Calculation (min): 16 min  Charges: OT General Charges $OT Visit: 1 Visit OT Treatments $Self Care/Home Management : 8-22 mins    Britt Bottom 08/28/2020, 3:36 PM

## 2020-08-28 NOTE — Progress Notes (Signed)
Initial Nutrition Assessment  DOCUMENTATION CODES:   Obesity unspecified  INTERVENTION:   -1 packet Juven BID, each packet provides 95 calories, 2.5 grams of protein (collagen), and 9.8 grams of carbohydrate (3 grams sugar); also contains 7 grams of L-arginine and L-glutamine, 300 mg vitamin C, 15 mg vitamin E, 1.2 mcg vitamin B-12, 9.5 mg zinc, 200 mg calcium, and 1.5 g  Calcium Beta-hydroxy-Beta-methylbutyrate to support wound healing -30 ml Prosource Plus TID, each supplement provides 100 kcals and 15 grams protein -Renal MVI daily  NUTRITION DIAGNOSIS:   Increased nutrient needs related to wound healing as evidenced by estimated needs.  GOAL:   Patient will meet greater than or equal to 90% of their needs  MONITOR:   PO intake,Supplement acceptance,Labs,Weight trends,Skin,I & O's  REASON FOR ASSESSMENT:   Consult Assessment of nutrition requirement/status  ASSESSMENT:   Ralph Peterson is a 55 y.o. 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  It is noted that patient had an MRI of his foot done on 08/22/2020 which was concerning for right foot plantar abscess at the second metatarsal amputation site/possible osteomyelitis.  Patient was seen by ID on 08/22/2020 when he was sent there from the wound care center and ID, Dr. West Bali assessed the patient spoke with podiatrist, Dr. Boneta Lucks who had  recommended patient be sent to the ED for IV antibiotics and surgical intervention.  Pt admitted with rt foot plantar abscess at the second metatarsal amputation site/ possible osteomyelitis.    2/5- s/p Procedure(s) (LRB): IRRIGATION AND DEBRIDEMENT OF RIGHT FOOT (Right)   Attempted to speak with pt x 3, however, at times of visits.   Pt with good appetite. Noted meal completion 100%. Pt would benefit from addition of oral nutrition supplements secondary to increased nutritional needs for wound healing.  Reviewed wt hx; wt has been stable over the past year.  Medications reviewed and include remeron and renvela.  Lab Results  Component Value Date   HGBA1C 6.7 (H) 08/25/2020   PTA DM medications are 5 mg glipizide daily.   Labs reviewed: Na: 128, K: 6.1, CBGS: 89-162 (inpatient orders for glycemic control are 0-6 units insulin aspart TID before meals and at bedtime).   Diet Order:   Diet Order            Diet Carb Modified Fluid consistency: Thin; Room service appropriate? Yes  Diet effective now                 EDUCATION NEEDS:   No education needs have been identified at this time  Skin:  Skin Assessment: Skin Integrity Issues: Skin Integrity Issues:: Incisions Incisions: closed rt leg, opne incision to rt foot  Last BM:  08/26/20  Height:   Ht Readings from Last 1 Encounters:  08/25/20 6' (1.829 m)    Weight:   Wt Readings from Last 1 Encounters:  08/28/20 102.9 kg    Ideal Body Weight:  80.9 kg  BMI:  Body mass index is 30.77  kg/m.  Estimated Nutritional Needs:   Kcal:  2200-2400  Protein:  125-140 grams  Fluid:  1000 ml + UOP    Loistine Chance, RD, LDN, [place] Registered Dietitian II Certified Diabetes Care and Education Specialist Please refer to [facility] for RD and/or RD on-call/weekend/after hours pager

## 2020-08-28 NOTE — Progress Notes (Signed)
PT Cancellation Note  Patient Details Name: Ralph Peterson MRN: [id number] DOB: 22-Oct-1965   Cancelled Treatment:    Reason Eval/Treat Not Completed: Patient at procedure or test/unavailable. Pt off the floor at [facility]. PT to return as able to progress mobility/complete stair negotiation.  Kittie Plater, PT, DPT Acute Rehabilitation Services Pager #: [phone] Office #: [phone]    Berline Lopes 08/28/2020, 8:54 AM

## 2020-08-28 NOTE — Progress Notes (Signed)
[facility] NEPHROLOGY PROGRESS NOTE  Assessment/ Plan: Pt is a 55 y.o. yo male  with a past medical history ESRD on HD (MWF), A. 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  #Right foot plantar abscess, diabetic foot ulcer: Status post I&D and now on broad-spectrum antibiotics, ciprofloxacin and vancomycin.  Antibiotics plan per primary team.  # ESRD MWF at [facility]: Tolerating dialysis well.  UF during HD.  #Hyperkalemia: Low potassium bath, discussed about low potassium diet.  Monitor lab.  # Anemia: Hemoglobin at goal.  Continue to monitor  # Secondary hyperparathyroidism: Calcium phosphorus level acceptable.  Continue Sensipar, sevelamer.  # HTN/volume: Blood pressure acceptable.  Continue current antihypertensive medications.  UF during HD.  Volume okay.  Subjective: Seen and examined in dialysis unit.  Patient reports that he might be discharged tomorrow.  Denies nausea vomiting chest pain shortness of breath. Objective Vital signs in last 24 hours: Vitals:   08/28/20 0900 08/28/20 0930 08/28/20 1000 08/28/20 1030  BP: 106/65 113/68 (!) 124/50 105/66  Pulse: 65 65 68 72  Resp:      Temp:      TempSrc:      SpO2:      Weight:      Height:       Weight change:  No intake or output data in the 24 hours ending 08/28/20 1057     Labs: Basic Metabolic Panel: Recent Labs  Lab 08/25/20 0412 08/25/20 1155 08/26/20 0942 08/27/20 0234 08/28/20 0345  NA 134*   < > 129* 131* 128*  K 5.0   < > 6.3* 5.5* 6.1*  CL 94*   < > 91* 96* 92*  CO2 27  --  '22 24 22  '$ GLUCOSE 117*   < > 79 81 103*  BUN 37*   < > 56* 36* 51*  CREATININE 5.58*   < > 7.36* 6.07* 7.91*  CALCIUM 9.2  --  9.1 8.8* 8.6*  PHOS 4.1  --  4.3  --  4.5   < > = values in this interval not displayed.   Liver  Function Tests: Recent Labs  Lab 08/22/20 1727 08/24/20 1500 08/25/20 0412 08/26/20 0942 08/28/20 0345  AST 18 20  --   --   --   ALT 13 13  --   --   --   ALKPHOS 107 109  --   --   --   BILITOT 1.0 1.0  --   --   --   PROT 6.5 6.9  --   --   --   ALBUMIN 2.9* 3.2* 2.7* 2.8* 2.7*   No results for input(s): LIPASE, AMYLASE in the last 168 hours. No results for input(s): AMMONIA in the last 168 hours. CBC: Recent Labs  Lab 08/24/20 1500 08/25/20 0412 08/25/20 1155 08/26/20 0942 08/27/20 0234 08/28/20 0656  WBC 6.0 6.2  --  6.2 4.6 5.7  NEUTROABS 3.9 3.9  --   --  2.9  --   HGB 12.5* 11.3*   < > 11.2* 11.2* 11.3*  HCT 39.2 35.4*   < > 33.8* 33.8* 34.9*  MCV 96.3 95.2  --  93.9 94.7 95.1  PLT 142* 129*  --  121* 110* 114*   < > = values in this interval not displayed.   Cardiac Enzymes: No  results for input(s): CKTOTAL, CKMB, CKMBINDEX, TROPONINI in the last 168 hours. CBG: Recent Labs  Lab 08/27/20 0652 08/27/20 1154 08/27/20 1654 08/27/20 2008 08/28/20 0631  GLUCAP 62* 112* 109* 162* 89    Iron Studies: No results for input(s): IRON, TIBC, TRANSFERRIN, FERRITIN in the last 72 hours. Studies/Results: No results found.  Medications: Infusions: . sodium chloride    . sodium chloride Stopped (08/25/20 1258)  . ciprofloxacin 400 mg (08/27/20 1740)    Scheduled Medications: . apixaban  5 mg Oral BID  . atorvastatin  40 mg Oral QHS  . Chlorhexidine Gluconate Cloth  6 each Topical Q0600  . cinacalcet  30 mg Oral Q supper  . escitalopram  10 mg Oral Daily  . fluticasone  2 spray Each Nare BID  . insulin aspart  0-6 Units Subcutaneous TID AC & HS  . metoprolol succinate  50 mg Oral BID  . mirtazapine  7.5 mg Oral QHS  . montelukast  10 mg Oral QHS  . mupirocin ointment  1 application Nasal BID  . pantoprazole  40 mg Oral Daily  . sevelamer carbonate  3,200 mg Oral TID WC  . sodium chloride flush  3 mL Intravenous Q12H  . sodium chloride flush  3 mL  Intravenous Q12H  . vancomycin variable dose per unstable renal function (pharmacist dosing)   Does not apply See admin instructions    have reviewed scheduled and prn medications.  Physical Exam: General:NAD, comfortable Heart:RRR, s1s2 nl Lungs:clear b/l, no crackle Abdomen:soft, Non-tender, non-distended Extremities: Trace LE edema Dialysis Access: AV fistula.  Levin Dagostino Prasad Valecia Beske 08/28/2020,10:57 AM  LOS: 4 days

## 2020-08-28 NOTE — Consult Note (Addendum)
[facility]    Date of Admission:  08/24/2020   Total days of antibiotics 5        Day 5 ciprofloxacin        Day 3 vancomycin               Reason for Consult: Right foot abscess     Referring Provider: Dr. Adele Barthel Primary Care Provider: Dr. Kathlyn [place]   Assessment: Right foot abscess status post I&D, washout and debridement Specimen cultures grew pseudomonas aeruginosa and enterococcus faecalis  Reported penicillin and amoxicillin allergy  Plan: 1. Continue abx for 1 week for coverage of pseudomonas aeruginosa and enterococcus faecalis 2. Enterococcus resistant to gentamicin 3. Will switch ciprofloxacin to levofloxacin due to cipro shortage; continue PO levofloxacin 250 mg daily for 7 days 4. 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 apixaban  5 mg Oral BID  . atorvastatin  40 mg Oral QHS  . Chlorhexidine Gluconate Cloth  6 each Topical Q0600  . cinacalcet  30 mg Oral Q supper  . escitalopram  10 mg Oral Daily  . fluticasone  2 spray Each Nare BID  . insulin aspart  0-6 Units Subcutaneous TID AC & HS   . metoprolol succinate  50 mg Oral BID  . mirtazapine  7.5 mg Oral QHS  . montelukast  10 mg Oral QHS  . mupirocin ointment  1 application Nasal BID  . pantoprazole  40 mg Oral Daily  . sevelamer carbonate  3,200 mg Oral TID WC  . sodium chloride flush  3 mL Intravenous Q12H  . sodium chloride flush  3 mL Intravenous Q12H  . vancomycin variable dose per unstable renal function (pharmacist dosing)   Does not apply See admin instructions   Continuous Infusions: . sodium chloride    . sodium chloride Stopped (08/25/20 1258)  . ciprofloxacin 400 mg (08/27/20 1740)   PRN Meds:.sodium chloride, albuterol, guaiFENesin, heparin, hydrALAZINE, HYDROmorphone (DILAUDID) injection, levalbuterol, loperamide, ondansetron **OR** ondansetron (ZOFRAN) IV, senna-docusate, sevelamer carbonate, sodium chloride flush, sorbitol, traMADol  HPI: Ralph Peterson is a 55 y.o. 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 He presented with right foot pain over the past month with evidence of post-operative infection.   He had a right foot MRI on 2/1 concerning for right foot plantar abscess at the second metatarsal amputation site and possible osteomyelitis. He was seen by ID later on 2/1 and alongside podiatry it was recommended he  go to the ED for IV antibiotics and surgical intervention. He was admitted 2/3 and had irrigation and debridement of his right foot with bone biopsy on 2/4. He was given 1 IV dose of ciprofloxacin and clindamycin in the ED.   Patient reports a penicillin and amoxicillin allergy with reported history of rash, hives and tongue swelling.   Review of Systems: Review of Systems  Constitutional: Negative for chills and fever.  Respiratory: Negative for shortness of breath.   Gastrointestinal: Negative  for abdominal pain, diarrhea, nausea and vomiting.  Skin: Negative for itching and rash.  Neurological: Negative for weakness and headaches.    Past Medical History:  Diagnosis Date  . Anxiety   . Arrhythmia    atrial fibrillation  . Atrial fibrillation ([place])   . CAD (coronary artery disease)    a. 09/2010 Cath/PCI ([facility]): LM nl, LAD 10m D1 80 (small), LCX 34mOM1 30, RI 70 (small), RCA 70 (DES).  . CHF (congestive heart failure) ([place]  . Coronary artery disease   . COVID-19 virus infection 07/2019  . Diabetes mellitus without complication ([facility]  . ESRD (end stage renal disease) ([place]  . Hyperlipidemia   . Hypertension   . Ischemic cardiomyopathy    a.  12/2011 Echo ([facility]): NL EF, mod LVH. Mild AS/MS, triv PR/TR. 07/2019 Echo: EF 25-30%, GR1 DD, inf/post HK, low nl RV fxn, mildly dil RA, triv MR, mild Ao sclerosis w/o stenosis.  . NSTEMI (non-ST elevated myocardial infarction) ([place]  . PAD (peripheral artery disease) ([place]   a. 03/2016 s/p PTA/DEB R SFA/popliteal/peroneal; b. 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 08/2017 Atherectomy/DEB dist L SFA/popliteal. PTA of L AT; d. 06/2018 PTA/DEB L SFA/popliteal/PT/AT; e. 01/2019 Stable ABIs.  . Sleep apnea     Social History   Tobacco Use  . Smoking status: Former Smoker    Types: Cigarettes    Quit date: 2010    Years since quitting: 12.1  . Smokeless tobacco: Never Used  Vaping Use  . Vaping Use: Never used  Substance Use Topics  . Alcohol use: Not Currently  . Drug use: No    Family History  Problem Relation Age of Onset  . Hypertension Mother    Allergies  Allergen Reactions  . Oxycodone-Acetaminophen Itching  . Gabapentin Other (See Comments)    unknown  . Tramadol Other (See Comments)    unknown  . Penicillins Swelling and Rash    Has patient had a PCN reaction causing immediate rash, facial/tongue/throat swelling, SOB or lightheadedness with hypotension: Yes Has patient had a PCN reaction causing severe  rash involving mucus membranes or skin necrosis: Yes Has patient had a PCN reaction that required hospitalization: No Has patient had a PCN reaction occurring within the last 10 years: No If all of the above answers are "NO", then may proceed with Cephalosporin use.     OBJECTIVE: Blood pressure 113/68, pulse 65, temperature 98.6 F (37 C), temperature source Oral, resp. rate 18, height 6' (1.829 m), weight 105.4 kg, SpO2 100 %.  Physical Exam Vitals reviewed.  Constitutional:      General: He is not in acute distress.    Appearance: Normal appearance. He is normal weight. He is not ill-appearing.  HENT:     Head: Normocephalic and atraumatic.  Eyes:     Extraocular Movements: Extraocular movements intact.     Conjunctiva/sclera: Conjunctivae normal.  Cardiovascular:     Rate and Rhythm: Normal rate  and regular rhythm.     Heart sounds: Normal heart sounds. No murmur heard. No friction rub. No gallop.   Pulmonary:     Effort: Pulmonary effort is normal. No respiratory distress.     Breath sounds: Normal breath sounds. No wheezing or rales.  Abdominal:     General: Abdomen is flat. Bowel sounds are normal. There is no distension.     Palpations: Abdomen is soft.     Tenderness: There is no abdominal tenderness.  Skin:    General: Skin is warm and dry.  Neurological:     Mental Status: He is alert and oriented to person, place, and time.  Psychiatric:        Mood and Affect: Mood normal.        Behavior: Behavior normal.        Thought Content: Thought content normal.        Judgment: Judgment normal.     Lab Results Lab Results  Component Value Date   WBC 5.7 08/28/2020   HGB 11.3 (L) 08/28/2020   HCT 34.9 (L) 08/28/2020   MCV 95.1 08/28/2020   PLT 114 (L) 08/28/2020    Lab Results  Component Value Date   CREATININE 7.91 (H) 08/28/2020   BUN 51 (H) 08/28/2020   NA 128 (L) 08/28/2020   K 6.1 (H) 08/28/2020   CL 92 (L) 08/28/2020   CO2 22 08/28/2020    Lab  Results  Component Value Date   ALT 13 08/24/2020   AST 20 08/24/2020   ALKPHOS 109 08/24/2020   BILITOT 1.0 08/24/2020     Microbiology: Recent Results (from the past 240 hour(s))  SARS Coronavirus 2 by RT PCR (hospital order, performed in [address] hospital lab) Nasopharyngeal Nasopharyngeal Swab     Status: None   Collection Time: 08/24/20  3:38 PM   Specimen: Nasopharyngeal Swab  Result Value Ref Range Status   SARS Coronavirus 2 NEGATIVE NEGATIVE Final    Comment: (NOTE) SARS-CoV-2 target nucleic acids are NOT DETECTED.  The SARS-CoV-2 RNA is generally detectable in upper and lower respiratory specimens during the acute phase of infection. The lowest concentration of SARS-CoV-2 viral copies this assay can detect is 250 copies / mL. A negative result does not preclude SARS-CoV-2 infection and should not be used as the sole basis for treatment or other patient management decisions.  A negative result may occur with improper specimen collection / handling, submission of specimen other than nasopharyngeal swab, presence of viral mutation(s) within the areas targeted by this assay, and inadequate number of viral copies (<250 copies / mL). A negative result must be combined with clinical observations, patient history, and epidemiological information.  Fact Sheet for Patients:   StrictlyIdeas.no  Fact Sheet for Healthcare Providers: BankingDealers.co.za  This test is not yet approved or  cleared by the [place] FDA and has been authorized for detection and/or diagnosis of SARS-CoV-2 by FDA under an Emergency Use Authorization (EUA).  This EUA will remain in effect (meaning this test can be used) for the duration of the COVID-19 declaration under Section 564(b)(1) of the Act, 21 U.S.C. section 360bbb-3(b)(1), unless the authorization is terminated or revoked sooner.  Performed at [facility], [place]  [address], [place], [place] [address]   Surgical PCR screen     Status: None   Collection Time: 08/25/20  6:20 AM   Specimen: Nasal Mucosa; Nasal Swab  Result Value Ref Range Status   MRSA, PCR NEGATIVE NEGATIVE  Final   Staphylococcus aureus NEGATIVE NEGATIVE Final    Comment: (NOTE) The Xpert SA Assay (FDA approved for NASAL specimens in patients 19 years of age and older), is one component of a comprehensive surveillance program. It is not intended to diagnose infection nor to guide or monitor treatment. Performed at [facility], [place] [address], [place], [place] [address]   Aerobic/Anaerobic Culture (surgical/deep wound)     Status: None (Preliminary result)   Collection Time: 08/25/20 12:48 PM   Specimen: Wound; Abscess  Result Value Ref Range Status   Specimen Description WOUND  Final   Special Requests RIGHT FOOT SPEC A  Final   Gram Stain   Final    FEW WBC PRESENT,BOTH PMN AND MONONUCLEAR RARE GRAM POSITIVE COCCI    Culture   Final    FEW ENTEROCOCCUS FAECALIS RARE PSEUDOMONAS AERUGINOSA NO ANAEROBES ISOLATED; CULTURE IN PROGRESS FOR 5 DAYS CULTURE REINCUBATED FOR BETTER GROWTH Performed at [facility], [place] [address] [place], [place] [address]    Report Status PENDING  Incomplete   Organism ID, Bacteria ENTEROCOCCUS FAECALIS  Final      Susceptibility   Enterococcus faecalis - MIC*    AMPICILLIN <=2 SENSITIVE Sensitive     VANCOMYCIN 1 SENSITIVE Sensitive     GENTAMICIN SYNERGY RESISTANT Resistant     * FEW ENTEROCOCCUS FAECALIS    Deion Swift Dutch Quint, DO PGY-2 IM  [facility] [place] [id number] pager    08/28/2020, 9:35 AM

## 2020-08-28 NOTE — Progress Notes (Signed)
Patient states that coccyx is tender, foam dressing applied , no redness or skin breakdown noticed.

## 2020-08-29 ENCOUNTER — Encounter: Payer: Self-pay | Admitting: Vascular Surgery

## 2020-08-29 DIAGNOSIS — I7025 Atherosclerosis of native arteries of other extremities with ulceration: Secondary | ICD-10-CM | POA: Diagnosis not present

## 2020-08-29 DIAGNOSIS — I70211 Atherosclerosis of native arteries of extremities with intermittent claudication, right leg: Secondary | ICD-10-CM | POA: Diagnosis not present

## 2020-08-29 DIAGNOSIS — L02619 Cutaneous abscess of unspecified foot: Secondary | ICD-10-CM | POA: Diagnosis not present

## 2020-08-29 DIAGNOSIS — M86171 Other acute osteomyelitis, right ankle and foot: Secondary | ICD-10-CM | POA: Diagnosis not present

## 2020-08-29 LAB — GLUCOSE, CAPILLARY
Glucose-Capillary: 90 mg/dL (ref 70–99)
Glucose-Capillary: 72 mg/dL (ref 70–99)
Glucose-Capillary: 115 mg/dL — ABNORMAL HIGH (ref 70–99)

## 2020-08-29 MED ORDER — VANCOMYCIN HCL IN DEXTROSE 1-5 GM/200ML-% IV SOLN: 1000.0000 mg | INTRAVENOUS | Status: AC

## 2020-08-29 MED ORDER — INSULIN DETEMIR 100 UNIT/ML ~~LOC~~ SOLN: 5.0000 [IU] | Freq: Every day | SUBCUTANEOUS | Status: AC

## 2020-08-29 MED ORDER — LEVOFLOXACIN 250 MG PO TABS: 250.0000 mg | ORAL_TABLET | Freq: Every day | ORAL | 0 refills | Status: AC

## 2020-08-29 NOTE — Progress Notes (Signed)
Brief nephrology note: Patient is already discharged from the hospital. Our team has informed his outpatient dialysis unit about IV vancomycin 1 g on next Wednesday and Friday for 2 doses. He will continue his MWF schedule dialysis as outpatient.

## 2020-08-29 NOTE — Care Management Important Message (Signed)
Important Message  Patient Details  Name: GOKU KELEMAN MRN: [id number] Date of Birth: [birth year]   Medicare Important Message Given:  Yes - Important Message mailed due to current National Emergency  Verbal consent obtained due to current National Emergency  Relationship to patient: Self Contact Name: Taevin Hlavac Call Date: 08/29/20  Time: 1358 Phone: [phone] Outcome: No Answer/Busy Important Message mailed to: Patient address on file    Delorse Lek 08/29/2020, 1:58 PM

## 2020-08-29 NOTE — Op Note (Signed)
Ralph Peterson Percutaneous Study/Intervention Procedural Note   Date of Surgery: 08/29/2020  Surgeon:  Katha Cabal, MD.  Pre-operative Diagnosis: Atherosclerotic occlusive disease bilateral lower extremities with ulceration and gangrene of the right lower extremity   Post-operative diagnosis: Same  Procedure(s) Performed: 1. Introduction catheter into right lower extremity 3rd order catheter placement  2. Contrast injection right lower extremity for distal runoff   3. Attempted crosser atherectomy of the right anterior tibial artery 4. Star close closure left common femoral arteriotomy                Anesthesia: Conscious sedation was administered under my direct supervision by the interventional radiology RN. IV Versed plus fentanyl were utilized. Continuous ECG, pulse oximetry and blood pressure was monitored throughout the entire procedure. Conscious sedation was for a total of 71 minutes.  Sheath: 6 French Raby left common femoral retrograde  Contrast: 70 cc  Fluoroscopy Time: 11 minutes  Indications: EDIBERTO Peterson presents with atherosclerotic occlusive disease bilateral lower extremities. The patient has developed ulceration and gangrene of the soft tissues of the right lower extremity. This places the patient at high risk for limb loss and amputation. The risks and benefits are reviewed all questions answered patient agrees to proceed.  Procedure: QUIENTIN BACALLAO is a 55 y.o. y.o. male who was identified and appropriate procedural time out was performed. The patient was then placed supine on the table and prepped and draped in the usual sterile fashion.   Ultrasound was placed in the sterile sleeve and the left groin was evaluated the left common femoral artery was echolucent and pulsatile indicating patency.  Image was recorded for the permanent record and under real-time  visualization a microneedle was inserted into the common femoral artery microwire followed by a micro-sheath.  A J-wire was then advanced through the micro-sheath and a  5 [place] sheath was then inserted over a J-wire. J-wire was then advanced and a 5 French pigtail catheter was positioned at the level of T12. AP projection of the aorta was then obtained. Pigtail catheter was repositioned to above the bifurcation and a LAO view of the pelvis was obtained.  Subsequently a pigtail catheter with the stiff angle Glidewire was used to cross the aortic bifurcation the catheter wire were advanced down into the right distal external iliac artery. Oblique view of the femoral bifurcation was then obtained and subsequently the wire was reintroduced and the pigtail catheter negotiated into the SFA representing third order catheter placement. Distal runoff was then performed.  Diagnostic interpretation: Abdominal aorta is opacified with bolus injection contrast.  The aorta and bilateral common external and internal iliac arteries are patent.  No hemodynamically significant stenosis.  The right common femoral profunda femoris superficial femoral-popliteal arteries are diseased but there are no hemodynamically significant focal stenoses.  Previously placed right SFA stents are widely patent.  The trifurcation his moderately diseased with single-vessel runoff via the peroneal.  Anterior tibial and posterior tibial are occluded at their origins.  There is reconstitution of the anterior tibial just above the ankle filling the dorsalis pedis and the pedal arch.  5000 units of heparin was then given and allowed to circulate and a 6 [place] Raby sheath was advanced up and over the bifurcation and positioned in the common femoral artery.  Wire and catheter were then used to negotiate the SFA stenosis and the catheter was then positioned in the distal popliteal where magnified imaging of the trifurcation was performed.  The 33 S  Crosser catheter was then prepped on the field and a straight Usher catheter was advanced into the cul-de-sac of the anterior tibial under magnified imaging. Using the Crosser catheter the occlusion of the anterior tibial was not successfully negotiated.   After review of these images the sheath is pulled into the left external iliac oblique of the common femoral is obtained and a Star close device deployed. There no immediate Complications.  Findings:  Abdominal aorta is opacified with bolus injection contrast.  The aorta and bilateral common external and internal iliac arteries are patent.  No hemodynamically significant stenosis.  The right common femoral profunda femoris superficial femoral-popliteal arteries are diseased but there are no hemodynamically significant focal stenoses.  Previously placed right SFA stents are widely patent.  The trifurcation his moderately diseased with single-vessel runoff via the peroneal.  Anterior tibial and posterior tibial are occluded at their origins.  There is reconstitution of the anterior tibial just above the ankle filling the dorsalis pedis and the pedal arch.  Crosser is unsuccessful.  There is reconstitution of the dorsalis pedis that would allow for retrograde pedal access.  This will be considered and discussed when the patient returns to the office.  Disposition: Patient was taken to the recovery room in stable condition having tolerated the procedure well.  [name] 08/29/2020,11:00 AM

## 2020-08-29 NOTE — Progress Notes (Signed)
Pt is ambulating in the room, with left surgical shoe on. Discharge instructions given to pt. Discharged to home accompanied by his son.

## 2020-08-29 NOTE — Discharge Summary (Signed)
Physician Discharge Summary   Patient ID: HUDSON ASCOLESE MRN: [id number] DOB/AGE: [birth year] 55 y.o.  Admit date: 08/24/2020 Discharge date: 08/29/2020  Primary Care Physician:  Vidal Schwalbe, MD   Recommendations for Outpatient Follow-up:  1. Follow up with PCP in 1-2 weeks 2. Continue vancomycin with hemodialysis for 2 more doses on Wednesday and Friday. 3. Continue levofloxacin for 7 days  Home Health: None, at baseline Equipment/Devices:   Discharge Condition: stable CODE STATUS: FULL  Diet recommendation: Carb modified diet   Discharge Diagnoses:    . Right foot plantar abscess, diabetic foot ulcer . Anemia of chronic disease . ESRD (end stage renal disease) ([place]) on hemodialysis MWF . Hyperlipidemia, unspecified . Hypertension . Obesity . Atrial fibrillation with RVR ([place]) . Pressure injury, mid coccyx, stage I, POA . Iron deficiency anemia, unspecified . Secondary hyperparathyroidism of renal origin ([place]) . Thrombocytopenia, unspecified ([place])  Diabetes mellitus type 2, IDDM  Consults: Podiatry Nephrology  ID     Allergies:   Allergies  Allergen Reactions  . Oxycodone-Acetaminophen Itching  . Gabapentin Other (See Comments)    unknown  . Tramadol Other (See Comments)    unknown  . Penicillins Swelling and Rash    Patient endorses itching/hives and tongue swelling with penicillin and amoxicillin at some point in the past.      DISCHARGE MEDICATIONS: Allergies as of 08/29/2020      Reactions   Oxycodone-acetaminophen Itching   Gabapentin Other (See Comments)   unknown   Tramadol Other (See Comments)   unknown   Penicillins Swelling, Rash   Patient endorses itching/hives and tongue swelling with penicillin and amoxicillin at some point in the past.       Medication List    STOP taking these medications   glipiZIDE 5 MG tablet Commonly known as: GLUCOTROL   sulfamethoxazole-trimethoprim 800-160 MG tablet Commonly known as: BACTRIM DS      TAKE these medications   albuterol 108 (90 Base) MCG/ACT inhaler Commonly known as: VENTOLIN HFA Inhale 2 puffs into the lungs every 6 (six) hours as needed for wheezing or shortness of breath.   apixaban 5 MG Tabs tablet Commonly known as: ELIQUIS Take 1 tablet (5 mg total) by mouth 2 (two) times daily.   atorvastatin 40 MG tablet Commonly known as: LIPITOR Take 40 mg by mouth at bedtime.   cinacalcet 30 MG tablet Commonly known as: SENSIPAR Take 30 mg by mouth at bedtime.   escitalopram 10 MG tablet Commonly known as: LEXAPRO Take 10 mg by mouth daily.   fluticasone 50 MCG/ACT nasal spray Commonly known as: FLONASE Place 2 sprays into both nostrils 2 (two) times daily.   ibuprofen 200 MG tablet Commonly known as: ADVIL Take 400 mg by mouth every 6 (six) hours as needed for mild pain.   insulin detemir 100 UNIT/ML injection Commonly known as: LEVEMIR Inject 0.05 mLs (5 Units total) into the skin daily.   levofloxacin 250 MG tablet Commonly known as: LEVAQUIN Take 1 tablet (250 mg total) by mouth daily for 7 days.   loperamide 2 MG capsule Commonly known as: IMODIUM Take 2 mg by mouth daily as needed for diarrhea or loose stools.   metoprolol succinate 50 MG 24 hr tablet Commonly known as: TOPROL-XL Take 50 mg by mouth 2 (two) times daily.   mirtazapine 7.5 MG tablet Commonly known as: REMERON Take 7.5 mg by mouth at bedtime.   montelukast 10 MG tablet Commonly known as: SINGULAIR Take 10 mg by mouth  at bedtime.   omeprazole 20 MG capsule Commonly known as: PRILOSEC Take 20 mg by mouth at bedtime.   sevelamer carbonate 800 MG tablet Commonly known as: RENVELA Take 1,600-3,200 mg by mouth See admin instructions. Take 4 tablets ('3200mg'$ ) three times daily with meals and take 2 tablet (1600 mg) by mouth with snacks   vancomycin 1-5 GM/200ML-% Soln Commonly known as: VANCOCIN Inject 200 mLs (1,000 mg total) into the vein every Monday, Wednesday, and  Friday with hemodialysis for 2 doses. Start taking on: August 30, 2020            Discharge Care Instructions  (From admission, onward)         Start     Ordered   08/29/20 0000  Leave dressing on - Keep it clean, dry, and intact until clinic visit        08/29/20 0946           Brief H and P: For complete details please refer to admission H and P, but in brief GIOVANN SALAHUDDIN a 55 y.o.male with A. 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 Posey Pronto and possible debridement. Patient underwent I&D of the right foot with bone biopsy on 2/4.  Cultures however showed Enterococcus and Pseudomonas.   Hospital Course:  Right foot plantar abscess, diabetic foot ulcer - Patient was sent by podiatry for IV antibiotics and surgery. -  Underwent irrigation and debridement on 2/4, by Dr. Boneta Lucks -Wound cultures from 1/20 showed E faecalis.  Wound cultures from 08/25/2020 showed Enterococcus faecalis, Pseudomonas aeruginosa -ID was consulted, appreciate ID recommendations, recommended vancomycin with dialysis and levofloxacin for coverage for 7 more days -Cleared by podiatry for discharge home, no dressing changes until follow-up, patient will follow up with podiatry in 1 week.  Weightbearing as tolerated to the heel with Darco wedge shoe.  Patient was also recommended by podiatry to be nonweightbearing as much as possible for the first 3 weeks.   ESRD on hemodialysis MWF -Patient was continued on hemodialysis per his schedule, last received on Monday, 2/7 -Will need to arrange vancomycin with dialysis at his outpatient dialysis center,  nephrology arranging it  Diabetes mellitus type 2, uncontrolled  with diabetic nephropathy, foot ulcer -Patient was on Levemir and SSI, had hypoglycemia and Levemir insulin was discontinued while inpatient -Hemoglobin A1c 6.7 on 2/4 -For outpatient regimen, glipizide discontinued, Levemir decreased to 5 units daily (was previously on 5 units twice daily)  Anemia of chronic disease -H&H currently stable  Essential hypertension -BP currently stable  Pressure injury, mid coccyx stage I, POA -Continue wound care  Atrial fibrillation, chronic -Rate controlled, continue beta-blocker -Continue eliquis  Hyperlipidemia -Continue statin   Obesity Estimated body mass index is 30.77 kg/m as calculated from the following:   Height as of this encounter: 6' (1.829 m).   Weight as of this encounter: 102.9 kg.  Day of Discharge S: Sitting up in the chair, looking forward to discharge home  BP 119/76 (BP Location: Left Arm)   Pulse 69   Temp 98 F (36.7 C)   Resp 17   Ht 6' (1.829 m)   Wt 102.9 kg   SpO2 94%   BMI 30.77 kg/m  Physical Exam: General: Alert and awake oriented x3 not in any acute distress. HEENT: anicteric sclera, pupils reactive to light and accommodation CVS: S1-S2 clear no murmur rubs or gallops Chest: clear to auscultation bilaterally, no wheezing rales or rhonchi Abdomen: soft nontender, nondistended, normal bowel sounds Extremities: Right foot dressing intact Neuro: no new deficits    Get Medicines reviewed and adjusted: Please take all your medications with you for your next visit with your Primary MD  Please request your Primary MD to go over all hospital tests and procedure/radiological results at the follow up. Please ask your Primary MD to get all Hospital records sent to his/her office.  If you experience worsening of your admission symptoms, develop shortness of breath, life threatening emergency, suicidal or homicidal thoughts you must seek medical attention immediately by calling 911 or calling your MD  immediately  if symptoms less severe.  You must read complete instructions/literature along with all the possible adverse reactions/side effects for all the Medicines you take and that have been prescribed to you. Take any new Medicines after you have completely understood and accept all the possible adverse reactions/side effects.   Do not drive when taking pain medications.   Do not take more than prescribed Pain, Sleep and Anxiety Medications  Special Instructions: If you have smoked or chewed Tobacco  in the last 2 yrs please stop smoking, stop any regular Alcohol  and or any Recreational drug use.  Wear Seat belts while driving.  Please note  You were cared for by a hospitalist during your hospital stay. Once you are discharged, your primary care physician will handle any further medical issues. Please note that NO REFILLS for any discharge medications will be authorized once you are discharged, as it is imperative that you return to your primary care physician (or establish a relationship with a primary care physician if you do not have one) for your aftercare needs so that they can reassess your need for medications and monitor your lab values.   The results of significant diagnostics from this hospitalization (including imaging, microbiology, ancillary and laboratory) are listed below for reference.      Procedures/Studies:  MR FOOT RIGHT W WO CONTRAST  Result Date: 08/22/2020 CLINICAL DATA:  History of amputations with a nonhealing wound. EXAM: MRI OF THE RIGHT FOREFOOT WITHOUT AND WITH CONTRAST TECHNIQUE: Multiplanar, multisequence MR imaging of the right foot was performed before and after the administration of intravenous contrast. CONTRAST:  30m GADAVIST GADOBUTROL 1 MMOL/ML IV SOLN COMPARISON:  Radiographs 04/18/2020 FINDINGS: Evidence of prior amputations of the second and third toes. The second metatarsal is amputated down to the proximal third and the fourth metatarsal is  amputated at the neck region. There is mild T1 and T2 signal abnormality at the amputation site of the second metatarsal. Is also slight enhancement. Could not exclude early/mild changes of osteomyelitis. There is an open wound on the plantar aspect of the forefoot located in the region of the second MTP joint. This communicates with a rim enhancing abscess which measures approximately 17 x 17 x 17 mm and contains gas. Surrounding moderate enhancing inflammatory changes. I do not see any findings suspicious for septic arthritis and no other definite findings for osteomyelitis. Mild changes of cellulitis and myositis. IMPRESSION: 1. Open wound on the plantar aspect of the forefoot located in the region of the second MTP joint. This communicates with a rim enhancing abscess which contains gas. 2. Mild T1 and T2 signal abnormality and enhancement  at the amputation site of the second metatarsal. Could not exclude early/mild changes of osteomyelitis. 3. Evidence of prior amputations of the second and third toes and metatarsals. 4. Mild changes of cellulitis and myositis. Electronically Signed   By: Marijo Sanes M.D.   On: 08/22/2020 11:27   PERIPHERAL VASCULAR CATHETERIZATION  Result Date: 08/29/2020 See op note  DG Foot Complete Right  Result Date: 08/22/2020 CLINICAL DATA:  Wound EXAM: RIGHT FOOT COMPLETE - 3+ VIEW COMPARISON:  04/18/2020 FINDINGS: Post amputation of second and third digits through the metatarsals. There are no erosive changes identified. There is lucency overlying the expected location of the head of the second metatarsal. There is additional lucency along the lateral aspect of the foot at the level of the distal fifth metatarsal. IMPRESSION: Lucency overlying the expected location of the head of the second metatarsal. This likely reflects soft tissue gas within the abscess noted on MRI. Additional lucency along the lateral aspect of the foot at the level of the distal fifth metatarsal may  reflect additional soft tissue gas. No erosive changes to suggest osteomyelitis. Electronically Signed   By: Macy Mis M.D.   On: 08/22/2020 18:21   VAS [place] ABI WITH/WO TBI  Result Date: 08/15/2020 LOWER EXTREMITY DOPPLER STUDY Indications: Ulceration, and peripheral artery disease.  Vascular Interventions: 02/01/2020: PTA and Stent placement Right SFA and                         Popliyeal Artery. Mechanical Thrombectomy Right SFA. Comparison Study: 05/04/2020 Performing Technologist: Almira Coaster RVS  Examination Guidelines: A complete evaluation includes at minimum, Doppler waveform signals and systolic blood pressure reading at the level of bilateral brachial, anterior tibial, and posterior tibial arteries, when vessel segments are accessible. Bilateral testing is considered an integral part of a complete examination. Photoelectric Plethysmograph (PPG) waveforms and toe systolic pressure readings are included as required and additional duplex testing as needed. Limited examinations for reoccurring indications may be performed as noted.  ABI Findings: +-----+------------------+-----+----------+--------+ RightRt Pressure (mmHg)IndexWaveform  Comment  +-----+------------------+-----+----------+--------+ ATA  250                    monophasicNC       +-----+------------------+-----+----------+--------+ PTA  250                    monophasicNC       +-----+------------------+-----+----------+--------+ +----+------------------+-----+----------+-------+ LeftLt Pressure (mmHg)IndexWaveform  Comment +----+------------------+-----+----------+-------+ ATA 243                    monophasicNC      +----+------------------+-----+----------+-------+ PTA 240                    monophasicNC      +----+------------------+-----+----------+-------+ +-------+-----------+-----------+------------+------------+ ABI/TBIToday's ABIToday's TBIPrevious ABIPrevious TBI  +-------+-----------+-----------+------------+------------+ Right  >1.0 [place]               >1.0 [place]                  +-------+-----------+-----------+------------+------------+ Left   >1.0 [place]               >1.0                   +-------+-----------+-----------+------------+------------+ Bilateral ABIs appear essentially unchanged compared to prior study on 05/04/2020.  Summary: Right: Resting right ankle-brachial index indicates noncompressible right lower extremity arteries. Left: Resting left ankle-brachial index indicates noncompressible left lower extremity  arteries.  *See table(s) above for measurements and observations.  Electronically signed by Hortencia Pilar MD on 08/15/2020 at 9:44:33 AM.   Final    VAS [place] (AVF,AVG)  Result Date: 08/15/2020 DIALYSIS ACCESS Access Site: Right Upper Extremity. Access Type: Radial-cephalic AVF. Comparison Study: 01/21/2019 Performing Technologist: Almira Coaster RVS  Examination Guidelines: A complete evaluation includes B-mode imaging, spectral Doppler, color Doppler, and power Doppler as needed of all accessible portions of each vessel. Unilateral testing is considered an integral part of a complete examination. Limited examinations for reoccurring indications may be performed as noted.  Findings: +--------------------+----------+-----------------+--------+ AVF                 PSV (cm/s)Flow Vol (mL/min)Comments +--------------------+----------+-----------------+--------+ Native artery inflow    60           962                +--------------------+----------+-----------------+--------+ AVF Anastomosis        147                              +--------------------+----------+-----------------+--------+  +------------+----------+-------------+----------+------------------+ OUTFLOW VEINPSV (cm/s)Diameter (cm)Depth (cm)     Describe      +------------+----------+-------------+----------+------------------+ AC Fossa        134                                              +------------+----------+-------------+----------+------------------+ Prox Forearm    56                           change in Diameter +------------+----------+-------------+----------+------------------+ Mid Forearm     27                           change in Diameter +------------+----------+-------------+----------+------------------+ Dist Forearm   631                                              +------------+----------+-------------+----------+------------------+  +--------------+-------------+---------+---------+----------+------------------+               Diameter (cm)  Depth  BranchingPSV (cm/s)   Flow Volume                                  (cm)                           (ml/min)      +--------------+-------------+---------+---------+----------+------------------+ Rt Rad Art                                       51                       Dist                                                                      +--------------+-------------+---------+---------+----------+------------------+  Summary: The Right Radial Cephalic AVF appears to be patent; Flow Volume appears to be normal. Elevated Velocities seen in the Distal Forearm area of the AVF just beyond the Anastomosis.  *See table(s) above for measurements and observations.  Diagnosing physician: Hortencia Pilar MD Electronically signed by Hortencia Pilar MD on 08/15/2020 at 9:44:40 AM.   --------------------------------------------------------------------------------   Final    VAS [place] LOWER EXTREMITY ARTERIAL DUPLEX  Result Date: 08/15/2020 LOWER EXTREMITY ARTERIAL DUPLEX STUDY Indications: Ulceration, and peripheral artery disease.  Vascular Interventions: 02/01/2020: PTA and Stent placement Right SFA and                         Popliyeal Artery. Mechanical Thrombectomy Right SFA. Current ABI:            Rt >1.0 [place], Lt >1.0 [place] Comparison Study:  01/27/2020 Performing Technologist: Almira Coaster RVS  Examination Guidelines: A complete evaluation includes B-mode imaging, spectral Doppler, color Doppler, and power Doppler as needed of all accessible portions of each vessel. Bilateral testing is considered an integral part of a complete examination. Limited examinations for reoccurring indications may be performed as noted.  +-----------+--------+-----+--------+----------+--------+ RIGHT      PSV cm/sRatioStenosisWaveform  Comments +-----------+--------+-----+--------+----------+--------+ CFA Distal 63                   monophasic         +-----------+--------+-----+--------+----------+--------+ DFA        73                   biphasic           +-----------+--------+-----+--------+----------+--------+ SFA Prox   97                   monophasic         +-----------+--------+-----+--------+----------+--------+ SFA Mid    96                   monophasic         +-----------+--------+-----+--------+----------+--------+ SFA Distal 63                   monophasic         +-----------+--------+-----+--------+----------+--------+ POP Distal 39                   monophasic         +-----------+--------+-----+--------+----------+--------+ ATA Distal 57                   monophasic         +-----------+--------+-----+--------+----------+--------+ PTA Distal 53                   monophasic         +-----------+--------+-----+--------+----------+--------+ PERO Distal87                   monophasic         +-----------+--------+-----+--------+----------+--------+  +-----------+--------+-----+--------+----------+--------+ LEFT       PSV cm/sRatioStenosisWaveform  Comments +-----------+--------+-----+--------+----------+--------+ CFA Distal 71                   monophasic         +-----------+--------+-----+--------+----------+--------+ DFA        53                   triphasic           +-----------+--------+-----+--------+----------+--------+ SFA Prox   68  monophasic         +-----------+--------+-----+--------+----------+--------+ SFA Mid    68                   monophasic         +-----------+--------+-----+--------+----------+--------+ SFA Distal 113                  monophasic         +-----------+--------+-----+--------+----------+--------+ POP Distal 153                  monophasic         +-----------+--------+-----+--------+----------+--------+ ATA Distal 29                   monophasic         +-----------+--------+-----+--------+----------+--------+ PTA Distal 48                   monophasic         +-----------+--------+-----+--------+----------+--------+ PERO Distal14                   monophasic         +-----------+--------+-----+--------+----------+--------+  Summary: Right: Imaging and Waveforms obtained throughout in the Right Lower Extremity. Monophasic Waveforms seen predominantly in the Right Lower Extremity.  Left: Imaging and Waveforms obtained throughout in the Left Lower Extremity. Monophasic Waveforms seen predominantly in the Left Lower Extremity.  See table(s) above for measurements and observations. Electronically signed by Hortencia Pilar MD on 08/15/2020 at 9:41:32 AM.    Final        LAB RESULTS: Basic Metabolic Panel: Recent Labs  Lab 08/27/20 0234 08/28/20 0345  NA 131* 128*  K 5.5* 6.1*  CL 96* 92*  CO2 24 22  GLUCOSE 81 103*  BUN 36* 51*  CREATININE 6.07* 7.91*  CALCIUM 8.8* 8.6*  PHOS  --  4.5   Liver Function Tests: Recent Labs  Lab 08/22/20 1727 08/24/20 1500 08/25/20 0412 08/26/20 0942 08/28/20 0345  AST 18 20  --   --   --   ALT 13 13  --   --   --   ALKPHOS 107 109  --   --   --   BILITOT 1.0 1.0  --   --   --   PROT 6.5 6.9  --   --   --   ALBUMIN 2.9* 3.2*   < > 2.8* 2.7*   < > = values in this interval not displayed.   No results for input(s):  LIPASE, AMYLASE in the last 168 hours. No results for input(s): AMMONIA in the last 168 hours. CBC: Recent Labs  Lab 08/27/20 0234 08/28/20 0656  WBC 4.6 5.7  NEUTROABS 2.9  --   HGB 11.2* 11.3*  HCT 33.8* 34.9*  MCV 94.7 95.1  PLT 110* 114*   Cardiac Enzymes: No results for input(s): CKTOTAL, CKMB, CKMBINDEX, TROPONINI in the last 168 hours. BNP: Invalid input(s): POCBNP CBG: Recent Labs  Lab 08/29/20 0409 08/29/20 0822  GLUCAP 90 72       Disposition and Follow-up: Discharge Instructions    Diet Carb Modified   Complete by: As directed    Increase activity slowly   Complete by: As directed    Leave dressing on - Keep it clean, dry, and intact until clinic visit   Complete by: As directed        DISPOSITION: Home   DISCHARGE FOLLOW-UP  Follow-up Information    Felipa Furnace, DPM. Schedule an  appointment as soon as possible for a visit in 1 week(s).   Specialty: Podiatry Contact information: [address] [place] [address] [phone]                Time coordinating discharge:  35 minutes  Signed:   Estill Cotta M.D. [facility] 08/29/2020, 11:05 AM

## 2020-08-30 LAB — AEROBIC/ANAEROBIC CULTURE W GRAM STAIN (SURGICAL/DEEP WOUND)

## 2020-08-31 ENCOUNTER — Ambulatory Visit: Payer: Medicare PPO | Admitting: Physician Assistant

## 2020-09-06 ENCOUNTER — Encounter: Payer: Medicare PPO | Admitting: Podiatry

## 2020-09-07 ENCOUNTER — Ambulatory Visit (INDEPENDENT_AMBULATORY_CARE_PROVIDER_SITE_OTHER): Payer: Medicare PPO | Admitting: Podiatry

## 2020-09-07 ENCOUNTER — Encounter: Payer: Self-pay | Admitting: Podiatry

## 2020-09-07 ENCOUNTER — Other Ambulatory Visit: Payer: Self-pay

## 2020-09-07 ENCOUNTER — Ambulatory Visit (INDEPENDENT_AMBULATORY_CARE_PROVIDER_SITE_OTHER): Payer: Medicare PPO | Admitting: Vascular Surgery

## 2020-09-07 ENCOUNTER — Encounter: Payer: Medicare PPO | Attending: Physician Assistant | Admitting: Physician Assistant

## 2020-09-07 VITALS — BP 141/85 | HR 83 | Temp 97.6°F

## 2020-09-07 DIAGNOSIS — E1151 Type 2 diabetes mellitus with diabetic peripheral angiopathy without gangrene: Secondary | ICD-10-CM | POA: Insufficient documentation

## 2020-09-07 DIAGNOSIS — N186 End stage renal disease: Secondary | ICD-10-CM | POA: Insufficient documentation

## 2020-09-07 DIAGNOSIS — E1140 Type 2 diabetes mellitus with diabetic neuropathy, unspecified: Secondary | ICD-10-CM | POA: Insufficient documentation

## 2020-09-07 DIAGNOSIS — L89893 Pressure ulcer of other site, stage 3: Secondary | ICD-10-CM | POA: Diagnosis not present

## 2020-09-07 DIAGNOSIS — Z9889 Other specified postprocedural states: Secondary | ICD-10-CM

## 2020-09-07 DIAGNOSIS — L97812 Non-pressure chronic ulcer of other part of right lower leg with fat layer exposed: Secondary | ICD-10-CM | POA: Insufficient documentation

## 2020-09-07 DIAGNOSIS — I132 Hypertensive heart and chronic kidney disease with heart failure and with stage 5 chronic kidney disease, or end stage renal disease: Secondary | ICD-10-CM | POA: Insufficient documentation

## 2020-09-07 DIAGNOSIS — E11621 Type 2 diabetes mellitus with foot ulcer: Secondary | ICD-10-CM | POA: Insufficient documentation

## 2020-09-07 DIAGNOSIS — E1122 Type 2 diabetes mellitus with diabetic chronic kidney disease: Secondary | ICD-10-CM | POA: Diagnosis not present

## 2020-09-07 DIAGNOSIS — I5042 Chronic combined systolic (congestive) and diastolic (congestive) heart failure: Secondary | ICD-10-CM | POA: Insufficient documentation

## 2020-09-07 DIAGNOSIS — Z992 Dependence on renal dialysis: Secondary | ICD-10-CM | POA: Diagnosis not present

## 2020-09-07 DIAGNOSIS — L97512 Non-pressure chronic ulcer of other part of right foot with fat layer exposed: Secondary | ICD-10-CM | POA: Insufficient documentation

## 2020-09-07 MED ORDER — CIPROFLOXACIN HCL 500 MG PO TABS: 500.0000 mg | ORAL_TABLET | Freq: Two times a day (BID) | ORAL | 0 refills | Status: AC

## 2020-09-07 NOTE — Progress Notes (Addendum)
ZAKHAR, WILINSKI ([id number]) Visit Report for 09/07/2020 Chief Complaint Document Details Patient Name: Ralph Peterson, Ralph Peterson. Date of Service: 09/07/2020 2:30 PM Medical Record Number: [id number] Patient Account Number: [id number] Date of Birth/Sex: May 27, 1966 (55 y.o. 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 M) Treating RN: Dolan Amen Primary Care Provider: Vidal Schwalbe Other Clinician: Referring Provider: Vidal Schwalbe Treating Provider/Extender: Skipper Cliche in Treatment: 19 History of Present Illness HPI Description: 08/17/2020 upon evaluation today patient actually appears to be doing decently well today which is good news in regard to the dorsal wound which is almost healed and the lateral wound also doing much better. With regard to the plantar wound this is still draining purulent drainage unfortunately the culture did reveal that he had Pseudomonas as well as Enterococcus. With that being said he is allergic to penicillin which took away the only oral medication for the Enterococcus I did look at linezolid as a possibility but unfortunately there were medication interactions. Also the same was true the Cipro where there were medication interactions.  Nonetheless I want to see if we can get him into infectious disease to see if they can work with him to try to find possibly add at home IV antibiotic regimen that can help out at this point. No sharp debridement is can be necessary today. 09/07/2020 upon evaluation today patient appears to be doing unfortunately not too well in regard to his wound. He has been tolerating the dressing changes without complication. Fortunately there is no sign of active infection at this time. No fevers, chills, nausea, vomiting, or diarrhea. The patient did have a repeat surgery going to clean out the abscess and try to work on getting things moving in a better direction about a week and a half ago. He still has sutures in place. With that being said unfortunately he does not appear to be showing signs of a lot of improvement in my opinion. He does have sutures on the plantar aspect still in place and on the dorsal aspect he has a small opening which actually probes all the way down through and actually came out of the plantar aspect. I feel like that he really is having a lot of issues here and I am afraid that if we do not get this taken care of this is can end up with him having a much larger and more extensive amputation than what he was hoping to have to deal with with this. It has already been since June 2021 but has been dealing with this including all the time since the amputation. Electronic Signature(s) Signed: 09/07/2020 4:49:39 PM By: Worthy Keeler PA-C Entered By: Worthy Keeler on 09/07/2020 16:49:39 Ralph Peterson ([id number]) -------------------------------------------------------------------------------- Physical Exam Details Patient Name: Ralph Peterson Date of Service: 09/07/2020 2:30 PM Medical Record Number: [id number] Patient Account Number: [id number] Date of Birth/Sex: [birth year] (55 y.o. M) Treating RN: Dolan Amen Primary Care Provider: Vidal Schwalbe Other  Clinician: Referring Provider: Vidal Schwalbe Treating Provider/Extender:  Jeri Cos Weeks in Treatment: 12 Constitutional Well-nourished and well-hydrated in no acute distress. Respiratory normal breathing without difficulty. Psychiatric this patient is able to make decisions and demonstrates good insight into disease process. Alert and Oriented x 3. pleasant and cooperative. Notes Upon inspection I do believe that the patient is unfortunately still not doing as well as I would like to see. For that reason I am to go ahead and make a referral for him to be seen at [facility] for a podiatry second opinion from a surgical standpoint. I am wondering if he may require a transmetatarsal amputation to try to get this under control. I am concerned that if we do not do something that he is going end up with a much more extensive possible even below-knee amputation. That is definitely not what I would like to see obviously. Electronic Signature(s) Signed: 09/07/2020 4:50:28 PM By: Worthy Keeler PA-C Entered By: Worthy Keeler on 09/07/2020 16:50:28 Ralph Peterson ([id number]) -------------------------------------------------------------------------------- Physician Orders Details Patient Name: Ralph Peterson Date of Service: 09/07/2020 2:30 PM Medical Record Number: [id number] Patient Account Number: [id number] Date of Birth/Sex: 06-17-1966 (55 y.o. 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 Wound #2 Right,Dorsal Foot o Return Appointment in 1 week. Wound #5 Right,Plantar Foot o Return Appointment in 1 week. Wound #7 Right,Dorsal Foot o Return Appointment in 1 week. Bathing/ Shower/ Hygiene Wound #1 Right,Lateral Foot o Clean wound with Normal Saline or wound cleanser. Wound #2 Right,Dorsal Foot o Clean wound with Normal Saline or wound cleanser. Wound #5 Right,Plantar Foot o Clean wound with Normal Saline or wound cleanser. Wound #7 Right,Dorsal Foot o Clean wound with Normal Saline or wound cleanser. Off-Loading Wound #1 Right,Lateral Foot o Open toe surgical shoe with peg assist. Wound #2 Right,Dorsal Foot o Open toe surgical shoe with peg assist. Wound Treatment Wound #1 - Foot Wound Laterality: Right, Lateral Cleanser: Normal Saline (Generic) 3 x Per Week/30 Days Discharge Instructions: Wash your hands with soap and water. Remove old dressing, discard into plastic bag and place into trash. Cleanse the wound with Normal Saline prior to applying a clean dressing using gauze sponges, not tissues or cotton balls. Do not scrub or use excessive force. Pat dry using gauze sponges, not tissue or cotton balls. Primary Dressing: Silvercel Small 2x2 (in/in) 3 x Per Week/30 Days Discharge Instructions: Apply Silvercel Small 2x2 (in/in) as instructed Ralph Peterson, Ralph Peterson. ([id number]) Secondary Dressing: ABD Pad 5x9 (in/in) (Generic) 3 x Per Week/30 Days Discharge Instructions: Cover with ABD pad Secured With: Conforming Stretch Gauze Bandage 4x75 (in/in) (Generic) 3 x Per Week/30 Days Discharge Instructions: Apply as  directed Wound #2 - Foot Wound Laterality: Dorsal, Right Cleanser: Normal Saline 3 x Per Week/30 Days Discharge Instructions: Wash your hands with soap and water. Remove old dressing, discard into  plastic bag and place into trash. Cleanse the wound with Normal Saline prior to applying a clean dressing using gauze sponges, not tissues or cotton balls. Do not scrub or use excessive force. Pat dry using gauze sponges, not tissue or cotton balls. Primary Dressing: Silvercel 4 1/4x 4 1/4 (in/in) 3 x Per Week/30 Days Discharge Instructions: Apply Silvercel 4 1/4x 4 1/4 (in/in) as instructed Secondary Dressing: ABD Pad 5x9 (in/in) (Generic) 3 x Per Week/30 Days Discharge Instructions: Cover with ABD pad Secured With: Conforming Stretch Gauze Bandage 4x75 (in/in) (Generic) 3 x Per Week/30 Days Discharge Instructions: Apply as directed Wound #7 - Foot Wound Laterality: Dorsal, Right Cleanser: Normal Saline 3 x Per Week/30 Days Discharge Instructions: Wash your hands with soap and water. Remove old dressing, discard into plastic bag and place into trash. Cleanse the wound with Normal Saline prior to applying a clean dressing using gauze sponges, not tissues or cotton balls. Do not scrub or use excessive force. Pat dry using gauze sponges, not tissue or cotton balls. Primary Dressing: Silvercel 4 1/4x 4 1/4 (in/in) 3 x Per Week/30 Days Discharge Instructions: Apply Silvercel 4 1/4x 4 1/4 (in/in) as instructed Secondary Dressing: ABD Pad 5x9 (in/in) (Generic) 3 x Per Week/30 Days Discharge Instructions: Cover with ABD pad Secured With: Conforming Stretch Gauze Bandage 4x75 (in/in) (Generic) 3 x Per Week/30 Days Discharge Instructions: Apply as directed Consults o Estate manager/land agent) Signed: 09/07/2020 4:58:35 PM By: Worthy Keeler PA-C Signed: 09/07/2020 5:25:46 PM By: Georges Mouse, Minus Breeding RN Entered By: Georges Mouse, Kenia on 09/07/2020 15:49:27 Ralph Peterson  ([id number]) -------------------------------------------------------------------------------- Problem List Details Patient Name: Ralph Peterson. Date of Service: 09/07/2020 2:30 PM Medical Record Number: [id number] Patient Account Number: [id number] Date of Birth/Sex: [birth year] (55 y.o. M) Treating RN: Dolan Amen Primary Care Provider: Vidal Schwalbe Other Clinician: Referring Provider: Vidal Schwalbe Treating Provider/Extender: Skipper Cliche in Treatment: 19 Active Problems ICD-10 Encounter Code Description Active Date MDM Diagnosis L89.893 Pressure ulcer of other site, stage 3 04/27/2020 No Yes T81.31XA Disruption of external operation (surgical) wound, not elsewhere 04/27/2020 No Yes classified, initial encounter L97.512 Non-pressure chronic ulcer of other part of right foot with fat layer 04/27/2020 No Yes exposed L97.812 Non-pressure chronic ulcer of other part of right lower leg with fat layer 04/27/2020 No Yes exposed E11.621 Type 2 diabetes mellitus with foot ulcer 04/27/2020 No Yes I50.42 Chronic combined systolic (congestive) and diastolic (congestive) heart 04/27/2020 No Yes failure N18.6 End stage renal disease 04/27/2020 No Yes Z99.2 Dependence on renal dialysis 04/27/2020 No Yes Inactive Problems Resolved Problems Electronic Signature(s) Signed: 09/07/2020 2:47:00 PM By: Worthy Keeler PA-C Entered By: Worthy Keeler on 09/07/2020 14:47:00 Ralph Peterson ([id number]) -------------------------------------------------------------------------------- Progress Note Details Patient Name: Ralph Peterson Date of Service: 09/07/2020 2:30 PM Medical Record Number: [id number] Patient Account Number: [id number] Date of Birth/Sex: [birth year] (55 y.o. 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 With regard to the plantar wound this is still draining purulent drainage unfortunately the culture did reveal that he  had Pseudomonas as well as Enterococcus. With that being said he is allergic to penicillin which took away the only oral medication for the Enterococcus I did look at linezolid as a possibility but unfortunately there were medication interactions. Also the same was true the Cipro where there were medication interactions. Nonetheless I want to see if we can get him into infectious disease to see if they can work with him to try to find possibly add at home IV antibiotic regimen that can help out at this point. No sharp debridement is can be necessary today. 09/07/2020 upon evaluation today patient appears to be doing unfortunately not too well in regard to his wound. He has been tolerating the dressing changes without complication. Fortunately there is no sign of active infection at this time. No fevers, chills, nausea, vomiting, or diarrhea. The patient did have a repeat surgery going to clean out the abscess and try to work on getting things moving in a better direction about a week and a half ago. He still has sutures in place. With that being said unfortunately he does not appear to be showing signs of a lot of improvement in my opinion. He does have sutures on the plantar aspect still in place and on the dorsal aspect he has a small opening which actually probes all the way down through and actually came out of the plantar aspect. I feel like that he really is having a lot of issues here and I am afraid that if we  do not get this taken care of this is can end up with him having a much larger and more extensive amputation than what he was hoping to have to deal with with this. It has already been since June 2021 but has been dealing with this including all the time since the amputation. Objective Constitutional Well-nourished and well-hydrated in no acute distress. Vitals Time Taken: 2:35 PM, Height: 72 in, Weight: 225 lbs, BMI: 30.5, Temperature: 98.1 F, Pulse: 80 bpm, Respiratory Rate: 18 breaths/min, Blood Pressure: 117/77 mmHg. Respiratory normal breathing without difficulty. Psychiatric this patient is able to make decisions and demonstrates good insight into disease process. Alert and Oriented x 3. pleasant and cooperative. General Notes: Upon inspection I do believe that the patient is unfortunately still not doing as well as I would like to see. For that reason I am to go ahead and make a referral for him to be seen at [facility] for a podiatry second opinion from a surgical standpoint. I am wondering if he may require a transmetatarsal amputation to try to get this under control. I am concerned that if we do not do something that he is going end up with a much more extensive possible even below-knee amputation. That is definitely not what I would like to see obviously. Integumentary (Hair, Skin) Wound #1 status is Open. Original cause of wound was Pressure Injury. The wound is located on the Right,Lateral Foot. The wound measures 2cm length x 0.8cm width x 0.2cm depth; 1.257cm^2 area and 0.251cm^3 volume. There is joint, tendon, and Fat Layer (Subcutaneous Tissue) exposed. There is no tunneling or undermining noted. There is a medium amount of serosanguineous drainage noted. The wound margin is distinct with the outline attached to the wound base. There is large (67-100%) red granulation within the wound bed. There is a small (1-33%) amount of necrotic tissue within the wound bed including Adherent  Slough. Wound #2 status is Open. Original cause of wound was Surgical Injury. The  wound is located on the Right,Dorsal Foot. The wound measures 0.2cm length x 0.2cm width x 3cm depth; 0.031cm^2 area and 0.094cm^3 volume. There is Fat Layer (Subcutaneous Tissue) exposed. There is no tunneling or undermining noted. There is a large amount of serosanguineous drainage noted. The wound margin is distinct with the outline attached to the wound base. There is large (67-100%) red granulation within the wound bed. There is no necrotic tissue within the wound bed. Ralph Peterson, Ralph Peterson ([id number]) Wound #7 status is Open. Original cause of wound was Blister. The wound is located on the Right,Dorsal Foot. The wound measures 4cm length x 5.5cm width x 0.1cm depth; 17.279cm^2 area and 1.728cm^3 volume. There is Fat Layer (Subcutaneous Tissue) exposed. There is no tunneling or undermining noted. There is a medium amount of serosanguineous drainage noted. There is large (67-100%) red granulation within the wound bed. There is no necrotic tissue within the wound bed. 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 Wound #2 Right,Dorsal Foot: Return Appointment in 1 week. Wound #5 Right,Plantar Foot: Return Appointment in 1 week. Wound #7 Right,Dorsal Foot: Return Appointment in 1 week. Bathing/ Shower/ Hygiene: Wound #1 Right,Lateral Foot: Clean wound with Normal  Saline or wound cleanser. Wound #2 Right,Dorsal Foot: Clean wound with Normal Saline or wound cleanser. Wound #5 Right,Plantar Foot: Clean wound with Normal Saline or wound cleanser. Wound #7 Right,Dorsal Foot: Clean wound with Normal Saline or wound cleanser. Off-Loading: Wound #1 Right,Lateral Foot: Open toe surgical shoe with peg assist. Wound #2 Right,Dorsal Foot: Open toe surgical shoe with peg assist. Consults ordered were: Podiatry - [facility] WOUND #1: - Foot Wound Laterality: Right, Lateral Cleanser: Normal Saline (Generic) 3 x Per Week/30 Days Discharge Instructions: Wash your hands with soap and water. Remove old dressing, discard into plastic bag and place into trash. Cleanse the wound with Normal Saline prior to applying a clean dressing using gauze sponges, not tissues or cotton balls. Do not scrub or use excessive force. Pat dry using gauze sponges, not tissue or cotton balls. Primary Dressing: Silvercel Small 2x2 (in/in) 3 x Per Week/30 Days Discharge Instructions: Apply Silvercel Small 2x2 (in/in) as instructed Secondary Dressing: ABD Pad 5x9 (in/in) (Generic) 3 x Per Week/30 Days Discharge Instructions: Cover with ABD pad Secured With: Conforming Stretch Gauze Bandage 4x75 (in/in) (Generic) 3 x Per Week/30 Days Discharge Instructions: Apply as directed WOUND #2: - Foot Wound Laterality: Dorsal, Right Cleanser: Normal Saline 3 x Per Week/30 Days Discharge Instructions: Wash your hands with soap and water. Remove old dressing, discard into plastic bag and place into trash. Cleanse the wound with Normal Saline prior to applying a clean dressing using gauze sponges, not tissues or cotton balls. Do not scrub or use excessive force. Pat dry using gauze sponges, not tissue or cotton balls. Primary Dressing: Silvercel 4 1/4x 4 1/4 (in/in) 3 x Per Week/30 Days Discharge Instructions: Apply Silvercel 4 1/4x 4 1/4 (in/in) as instructed Secondary Dressing: ABD Pad 5x9 (in/in)  (Generic) 3 x Per Week/30 Days Discharge Instructions: Cover with ABD pad Secured  With: Conforming Stretch Gauze Bandage 4x75 (in/in) (Generic) 3 x Per Week/30 Days Discharge Instructions: Apply as directed WOUND #7: - Foot Wound Laterality: Dorsal, Right Ralph Peterson, Ralph S. ([id number]) Cleanser: Normal Saline 3 x Per Week/30 Days Discharge Instructions: Wash your hands with soap and water. Remove old dressing, discard into plastic bag and place into trash. Cleanse the wound with Normal Saline prior to applying a clean dressing using gauze sponges, not tissues or cotton balls. Do not scrub or use excessive force. Pat dry using gauze sponges, not tissue or cotton balls. Primary Dressing: Silvercel 4 1/4x 4 1/4 (in/in) 3 x Per Week/30 Days Discharge Instructions: Apply Silvercel 4 1/4x 4 1/4 (in/in) as instructed Secondary Dressing: ABD Pad 5x9 (in/in) (Generic) 3 x Per Week/30 Days Discharge Instructions: Cover with ABD pad Secured With: Conforming Stretch Gauze Bandage 4x75 (in/in) (Generic) 3 x Per Week/30 Days Discharge Instructions: Apply as directed 1. The patient is in agreement with [place] going ahead and make an referral for him to be seen at [facility] for a second opinion in order to see if there is anything that can be done to either correct the current issue or else possibly transmetatarsal amputation in order to get this to heal and hopefully close for good. 2. In the meantime we can continue with silver alginate to all wound locations. 3. I am also can recommend the patient should continue to avoid walking at all on his foot I do not want anything worsen in that regard. We will see patient back for reevaluation in 1 week here in the clinic. If anything worsens or changes patient will contact our office for additional recommendations. Electronic Signature(s) Signed: 09/07/2020 4:51:08 PM By: Worthy Keeler PA-C Entered By: Worthy Keeler on 09/07/2020 16:51:08 Ralph Peterson ([id number]) -------------------------------------------------------------------------------- SuperBill Details Patient Name: Ralph Peterson Date of Service: 09/07/2020 Medical Record Number: [id number] Patient Account Number: [id number] Date of Birth/Sex: [birth year] (54 y.o. 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 End stage renal disease Z99.2 Dependence on renal dialysis Facility Procedures CPT4 Code: [id number] Description: 99213 - WOUND CARE VISIT-LEV 3 EST PT Modifier: Quantity: 1 Physician Procedures CPT4 Code: [id number] Description: 99214 - WC PHYS LEVEL 4 - EST PT Modifier: Quantity: 1 CPT4 Code: Description: ICD-10 Diagnosis Description L89.893 Pressure ulcer of other site, stage 3 T81.31XA Disruption of external operation (surgical) wound, not elsewhere classifi L97.512 Non-pressure chronic ulcer of other part of right foot with fat layer exp  L97.812 Non-pressure chronic ulcer of other part of right lower leg with fat laye Modifier: ed, initial encounter osed r exposed Quantity: Electronic Signature(s) Signed: 09/07/2020 4:51:26 PM By: Worthy Keeler PA-C Entered By: Worthy Keeler on 09/07/2020 16:51:26

## 2020-09-07 NOTE — Progress Notes (Signed)
Subjective:  Patient ID: Ralph Peterson, male    DOB: 1965-12-15,  MRN: [id number]  Chief Complaint  Patient presents with  . Routine Post Op    POV #1 DOS 08/26/2020 IRRIGATION AND DEBRIDEMENT OF RIGHT FOOT     55 y.o. male returns for post-op check.  Patient states is doing well.  He states that he does not have any pain.  Mild tenderness to the area.  He has been staying off as much as he can.  He has a wound care appointment scheduled for this afternoon  Review of Systems: Negative except as noted in the HPI. Denies N/V/F/Ch.  Past Medical History:  Diagnosis Date  . Anxiety   . Arrhythmia    atrial fibrillation  . Atrial fibrillation ([place])   . CAD (coronary artery disease)    a. 09/2010 Cath/PCI ([facility]): LM nl, LAD 24m D1 80 (small), LCX 363mOM1 30, RI 70 (small), RCA 70 (DES).  . CHF (congestive heart failure) ([facility]  . Coronary artery disease   . COVID-19 virus infection 07/2019  . Diabetes mellitus without complication ([facility]  . ESRD (end stage renal disease) ([facility]  . Hyperlipidemia   . Hypertension   . Ischemic cardiomyopathy    a.  12/2011 Echo ([facility]): NL EF, mod LVH. Mild AS/MS, triv PR/TR. 07/2019 Echo: EF 25-30%, GR1 DD, inf/post HK, low nl RV fxn, mildly dil RA, triv MR, mild Ao sclerosis w/o stenosis.  . NSTEMI (non-ST elevated myocardial infarction) ([facility] [place]  . PAD (peripheral artery disease) ([facility]   a. 03/2016 s/p PTA/DEB R SFA/popliteal/peroneal; b. 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 08/2017 Atherectomy/DEB dist L SFA/popliteal. PTA of L AT; d. 06/2018 PTA/DEB L SFA/popliteal/PT/AT; e. 01/2019 Stable ABIs.  . Sleep apnea     Current Outpatient Medications:  .  ciprofloxacin (CIPRO) 500 MG tablet, Take 1 tablet (500 mg total) by mouth 2 (two) times daily., Disp: 20 tablet, Rfl: 0 .  albuterol (VENTOLIN HFA) 108 (90 Base) MCG/ACT inhaler, Inhale 2 puffs into the lungs every 6 (six) hours as needed for wheezing or shortness of breath., Disp: , Rfl:  .   apixaban (ELIQUIS) 5 MG TABS tablet, Take 1 tablet (5 mg total) by mouth 2 (two) times daily., Disp: 60 tablet, Rfl: 3 .  atorvastatin (LIPITOR) 40 MG tablet, Take 40 mg by mouth at bedtime., Disp: , Rfl: 1 .  cinacalcet (SENSIPAR) 30 MG tablet, Take 30 mg by mouth at bedtime., Disp: , Rfl:  .  escitalopram (LEXAPRO) 10 MG tablet, Take 10 mg by mouth daily., Disp: , Rfl:  .  fluticasone (FLONASE) 50 MCG/ACT nasal spray, Place 2 sprays into both nostrils 2 (two) times daily., Disp: , Rfl:  .  ibuprofen (ADVIL) 200 MG tablet, Take 400 mg by mouth every 6 (six) hours as needed for mild pain., Disp: , Rfl:  .  insulin detemir (LEVEMIR) 100 UNIT/ML injection, Inject 0.05 mLs (5 Units total) into the skin daily., Disp: , Rfl:  .  loperamide (IMODIUM) 2 MG capsule, Take 2 mg by mouth daily as needed for diarrhea or loose stools., Disp: , Rfl:  .  metoprolol succinate (TOPROL-XL) 50 MG 24 hr tablet, Take 50 mg by mouth 2 (two) times daily., Disp: , Rfl:  .  mirtazapine (REMERON) 7.5 MG tablet, Take 7.5 mg by mouth at bedtime., Disp: , Rfl:  .  montelukast (SINGULAIR) 10 MG tablet, Take 10 mg by mouth at bedtime., Disp: ,  Rfl:  .  omeprazole (PRILOSEC) 20 MG capsule, Take 20 mg by mouth at bedtime., Disp: , Rfl:  .  sevelamer carbonate (RENVELA) 800 MG tablet, Take 1,600-3,200 mg by mouth See admin instructions. Take 4 tablets ('3200mg'$ ) three times daily with meals and take 2 tablet (1600 mg) by mouth with snacks, Disp: , Rfl:   Social History   Tobacco Use  Smoking Status Former Smoker  . Types: Cigarettes  . Quit date: 2010  . Years since quitting: 12.1  Smokeless Tobacco Never Used    Allergies  Allergen Reactions  . Oxycodone-Acetaminophen Itching  . Gabapentin Other (See Comments)    unknown  . Tramadol Other (See Comments)    unknown  . Penicillins Swelling and Rash    Patient endorses itching/hives and tongue swelling with penicillin and amoxicillin at some point in the past.     Objective:   Vitals:   09/07/20 1036  BP: (!) 141/85  Pulse: 83  Temp: 97.6 F (36.4 C)   There is no height or weight on file to calculate BMI. Constitutional Well developed. Well nourished.  Vascular Foot warm and well perfused. Capillary refill normal to all digits.   Neurologic Normal speech. Oriented to person, place, and time. Epicritic sensation to light touch grossly present bilaterally.  Dermatologic Skin healing well without signs of infection. Skin edges well coapted without signs of infection.  Orthopedic: Tenderness to palpation noted about the surgical site.   Radiographs: None Assessment:   1. Right foot ulcer, with fat layer exposed ([place])   2. S/P foot surgery, right    Plan:  Patient was evaluated and treated and all questions answered.  S/p foot surgery right -Progressing as expected post-operatively. -XR: None -WB Status: Partial weightbearing to the heel in Darco wedge shoe -Sutures: Intact.  No signs of dehiscence noted.  No clinical signs of infection noted. -Medications: I will change medication to ciprofloxacin as patient has Pseudomonas and Enterobacter that seems to be sensitive to ciprofloxacin. -Foot redressed.  No follow-ups on file.

## 2020-09-11 NOTE — Progress Notes (Signed)
ZACHARYAH, POHLER ([id number]) Visit Report for 09/07/2020 Arrival Information Details Patient Name: Ralph Peterson, MO. Date of Service: 09/07/2020 2:30 PM Medical Record Number: [id number] Patient Account Number: [id number] Date of Birth/Sex: 05-Nov-1965 (55 y.o. M) Treating RN: Dolan Amen Primary Care Aleshka Corney: Vidal Schwalbe Other Clinician: Referring Ayaansh Smail: Vidal Schwalbe Treating Rian Busche/Extender: Skipper Cliche in Treatment: 19 Visit Information History Since Last Visit Added or deleted any medications: No Patient Arrived: Wheel Chair Any new allergies or adverse reactions: No Arrival Time: 14:38 Had a fall or experienced change in No Accompanied By: son activities of daily living that may affect Transfer Assistance: None risk of falls: Patient Requires Transmission-Based Precautions: No Signs or symptoms of abuse/neglect since last visito No Patient Has Alerts: No Hospitalized since last visit: Yes Implantable device outside of the clinic excluding No cellular tissue based products placed in the center since last visit: Pain Present Now: No Electronic Signature(s) Signed: 09/07/2020 4:36:17 PM By: Lorine Bears RCP, RRT, CHT Entered By: Lorine Bears on 09/07/2020 14:39:24 Ralph Peterson ([id number]) -------------------------------------------------------------------------------- Clinic Level of Care Assessment Details Patient Name: Ralph Peterson. Date of Service: 09/07/2020 2:30 PM Medical Record Number: [id number] Patient Account Number: [id number] Date of Birth/Sex: [birth year] (55 y.o. M) Treating RN: Dolan Amen Primary Care Silverio Hagan: Vidal Schwalbe Other Clinician: Referring Nicolo Tomko: Vidal Schwalbe Treating Xochilt Conant/Extender: Skipper Cliche in Treatment: 19 Clinic Level of Care Assessment Items TOOL 4 Quantity Score X - Use when only an EandM is performed on FOLLOW-UP visit 1 0 ASSESSMENTS -  Nursing Assessment / Reassessment X - Reassessment of Co-morbidities (includes updates in patient status) 1 10 X- 1 5 Reassessment of Adherence to Treatment Plan ASSESSMENTS - Wound and Skin Assessment / Reassessment '[]'$  - Simple Wound Assessment / Reassessment - one wound 0 X- 3 5 Complex Wound Assessment / Reassessment - multiple wounds '[]'$  - 0 Dermatologic / Skin Assessment (not related to wound area) ASSESSMENTS - Focused Assessment '[]'$  - Circumferential Edema Measurements - multi extremities 0 '[]'$  - 0 Nutritional Assessment / Counseling / Intervention '[]'$  - 0 Lower Extremity Assessment (monofilament, tuning fork, pulses) '[]'$  - 0 Peripheral Arterial Disease Assessment (using hand held doppler) ASSESSMENTS - Ostomy and/or Continence Assessment and Care '[]'$  - Incontinence Assessment and Management 0 '[]'$  - 0 Ostomy Care Assessment and Management (repouching, etc.) PROCESS - Coordination of Care X - Simple Patient / Family Education for ongoing care 1 15 '[]'$  - 0 Complex (extensive) Patient / Family Education for ongoing care '[]'$  - 0 Staff obtains Programmer, systems, Records, Test Results / Process Orders '[]'$  - 0 Staff telephones HHA, Nursing Homes / Clarify orders / etc '[]'$  - 0 Routine Transfer to another Facility (non-emergent condition) '[]'$  - 0 Routine Hospital Admission (non-emergent condition) '[]'$  - 0 New Admissions / Biomedical engineer / Ordering NPWT, Apligraf, etc. '[]'$  - 0 Emergency Hospital Admission (emergent condition) X- 1 10 Simple Discharge Coordination '[]'$  - 0 Complex (extensive) Discharge Coordination PROCESS - Special Needs '[]'$  - Pediatric / Minor Patient Management 0 '[]'$  - 0 Isolation Patient Management '[]'$  - 0 Hearing / Language / Visual special needs '[]'$  - 0 Assessment of Community assistance (transportation, D/C planning, etc.) '[]'$  - 0 Additional assistance / Altered mentation '[]'$  - 0 Support Surface(s) Assessment (bed, cushion, seat, etc.) INTERVENTIONS - Wound  Cleansing / Measurement Bettcher, Kule S. ([id number]) '[]'$  - 0 Simple Wound Cleansing - one wound X- 3 5 Complex Wound Cleansing - multiple wounds X- 1 5 Wound Imaging (photographs - any number  of wounds) '[]'$  - 0 Wound Tracing (instead of photographs) '[]'$  - 0 Simple Wound Measurement - one wound X- 3 5 Complex Wound Measurement - multiple wounds INTERVENTIONS - Wound Dressings '[]'$  - Small Wound Dressing one or multiple wounds 0 X- 1 15 Medium Wound Dressing one or multiple wounds '[]'$  - 0 Large Wound Dressing one or multiple wounds '[]'$  - 0 Application of Medications - topical '[]'$  - 0 Application of Medications - injection INTERVENTIONS - Miscellaneous '[]'$  - External ear exam 0 '[]'$  - 0 Specimen Collection (cultures, biopsies, blood, body fluids, etc.) '[]'$  - 0 Specimen(s) / Culture(s) sent or taken to Lab for analysis '[]'$  - 0 Patient Transfer (multiple staff / Civil Service fast streamer / Similar devices) '[]'$  - 0 Simple Staple / Suture removal (25 or less) '[]'$  - 0 Complex Staple / Suture removal (26 or more) '[]'$  - 0 Hypo / Hyperglycemic Management (close monitor of Blood Glucose) '[]'$  - 0 Ankle / Brachial Index (ABI) - do not check if billed separately X- 1 5 Vital Signs Has the patient been seen at the hospital within the last three years: Yes Total Score: 110 Level Of Care: New/Established - Level 3 Electronic Signature(s) Signed: 09/07/2020 5:25:46 PM By: Georges Mouse, Minus Breeding RN Entered By: Georges Mouse, [place] on 09/07/2020 15:50:30 Ralph Peterson ([id number]) -------------------------------------------------------------------------------- Lower Extremity Assessment Details Patient Name: Ralph Peterson. Date of Service: 09/07/2020 2:30 PM Medical Record Number: [id number] Patient Account Number: [id number] Date of Birth/Sex: 1966/06/04 (55 y.o. M) Treating RN: Carlene Coria Primary Care Uriah Philipson: Vidal Schwalbe Other Clinician: Referring Latash Nouri: Vidal Schwalbe Treating  Taylynn Easton/Extender: Skipper Cliche in Treatment: 19 Edema Assessment Assessed: [Left: No] [Right: No] Edema: [Left: Ye] [Right: s] Calf Left: Right: Point of Measurement: 40 cm From Medial Instep 31 cm Ankle Left: Right: Point of Measurement: 10 cm From Medial Instep 21 cm Vascular Assessment Pulses: Dorsalis Pedis Palpable: [Right:Yes] Electronic Signature(s) Signed: 09/11/2020 4:57:18 PM By: Carlene Coria RN Entered By: Carlene Coria on 09/07/2020 15:03:32 Ralph Peterson ([id number]) -------------------------------------------------------------------------------- Multi Wound Chart Details Patient Name: Ralph Peterson. Date of Service: 09/07/2020 2:30 PM Medical Record Number: [id number] Patient Account Number: [id number] Date of Birth/Sex: 05-Jul-1966 (55 y.o. M) Treating RN: Dolan Amen Primary Care Fabiha Rougeau: Vidal Schwalbe Other Clinician: Referring Nuri Larmer: Vidal Schwalbe Treating Shamya Macfadden/Extender: Skipper Cliche in Treatment: 19 Vital Signs Height(in): 72 Pulse(bpm): 80 Weight(lbs): 225 Blood Pressure(mmHg): 117/77 Body Mass Index(BMI): 31 Temperature(F): 98.1 Respiratory Rate(breaths/min): 18 Photos: Wound Location: Right, Lateral Foot Right, Dorsal Foot Right, Dorsal Foot Wounding Event: Pressure Injury Surgical Injury Blister Primary Etiology: Pressure Ulcer Open Surgical Wound Diabetic Wound/Ulcer of the Lower Extremity Comorbid History: Arrhythmia, Congestive Heart Arrhythmia, Congestive Heart Arrhythmia, Congestive Heart Failure, Coronary Artery Disease, Failure, Coronary Artery Disease, Failure, Coronary Artery Disease, Hypertension, Myocardial Infarction, Hypertension, Myocardial Infarction, Hypertension, Myocardial Infarction, Peripheral Venous Disease, Type II Peripheral Venous Disease, Type II Peripheral Venous Disease, Type II Diabetes, Neuropathy Diabetes, Neuropathy Diabetes, Neuropathy Date Acquired: 02/20/2020 04/20/2020  09/05/2020 Weeks of Treatment: 19 19 0 Wound Status: Open Open Open Measurements L x W x D (cm) 2x0.8x0.2 0.2x0.2x3 4x5.5x0.1 Area (cm) : 1.257 0.031 17.279 Volume (cm) : 0.251 0.094 1.728 % Reduction in Area: 75.10% 99.30% N/A % Reduction in Volume: 90.10% 93.40% N/A Classification: Category/Stage IV Full Thickness Without Exposed Grade 2 Support Structures Exudate Amount: Medium Large Medium Exudate Type: Serosanguineous Serosanguineous Serosanguineous Exudate Color: red, brown red, brown red, brown Wound Margin: Distinct, outline attached Distinct, outline attached N/A Granulation Amount: Large (67-100%)  Large (67-100%) Large (67-100%) Granulation Quality: Red Red Red Necrotic Amount: Small (1-33%) None Present (0%) None Present (0%) Exposed Structures: Fat Layer (Subcutaneous Tissue): Fat Layer (Subcutaneous Tissue): Fat Layer (Subcutaneous Tissue): Yes Yes Yes Tendon: Yes Fascia: No Fascia: No Joint: Yes Tendon: No Tendon: No Fascia: No Muscle: No Muscle: No Muscle: No Joint: No Joint: No Bone: No Bone: No Bone: No Epithelialization: None Large (67-100%) None Treatment Notes Electronic Signature(s) Signed: 09/07/2020 5:25:46 PM By: Georges Mouse, Minus Breeding RN Entered By: Georges Mouse, [place] on 09/07/2020 15:41:45 Ralph Peterson ([name] ([id number]) -------------------------------------------------------------------------------- Multi-Disciplinary Care Plan Details Patient Name: Ralph Peterson Date of Service: 09/07/2020 2:30 PM Medical Record Number: [id number] Patient Account Number: [id number] Date of Birth/Sex: 04-08-1966 (55 y.o. M) Treating RN: Dolan Amen Primary Care Tejay Hubert: Vidal Schwalbe Other Clinician: Referring Alnita Aybar: Vidal Schwalbe Treating Chasitty Hehl/Extender: Skipper Cliche in Treatment: 19 Active Inactive Necrotic Tissue Nursing Diagnoses: Impaired tissue integrity related to  necrotic/devitalized tissue Knowledge deficit related to management of necrotic/devitalized tissue Goals: Necrotic/devitalized tissue will be minimized in the wound bed Date Initiated: 05/09/2020 Target Resolution Date: 05/09/2020 Goal Status: Active Interventions: Assess patient pain level pre-, during and post procedure and prior to discharge Provide education on necrotic tissue and debridement process Treatment Activities: Enzymatic debridement : 05/09/2020 Notes: Wound/Skin Impairment Nursing Diagnoses: Impaired tissue integrity Knowledge deficit related to ulceration/compromised skin integrity Goals: Patient/caregiver will verbalize understanding of skin care regimen Date Initiated: 04/27/2020 Target Resolution Date: 05/25/2020 Goal Status: Active Interventions: Assess patient/caregiver ability to obtain necessary supplies Assess patient/caregiver ability to perform ulcer/skin care regimen upon admission and as needed Assess ulceration(s) every visit Provide education on ulcer and skin care Treatment Activities: Skin care regimen initiated : 04/27/2020 Topical wound management initiated : 04/27/2020 Notes: Electronic Signature(s) Signed: 09/07/2020 5:25:46 PM By: Georges Mouse, Minus Breeding RN Entered By: Georges Mouse, [place] on 09/07/2020 15:41:39 Ralph Peterson ([id number]) -------------------------------------------------------------------------------- Pain Assessment Details Patient Name: Ralph Peterson. Date of Service: 09/07/2020 2:30 PM Medical Record Number: [id number] Patient Account Number: [id number] Date of Birth/Sex: 12/05/1965 (55 y.o. M) Treating RN: Carlene Coria Primary Care Caiden Arteaga: Vidal Schwalbe Other Clinician: Referring Tarynn Garling: Vidal Schwalbe Treating Alixandra Alfieri/Extender: Skipper Cliche in Treatment: 19 Active Problems Location of Pain Severity and Description of Pain Patient Has Paino No Site Locations Pain Management and  Medication Current Pain Management: Electronic Signature(s) Signed: 09/11/2020 4:57:18 PM By: Carlene Coria RN Entered By: Carlene Coria on 09/07/2020 14:56:59 Ralph Peterson ([id number]) -------------------------------------------------------------------------------- Wound Assessment Details Patient Name: Ralph Peterson. Date of Service: 09/07/2020 2:30 PM Medical Record Number: [id number] Patient Account Number: [id number] Date of Birth/Sex: 02-07-1966 (55 y.o. M) Treating RN: Carlene Coria Primary Care Daisean Brodhead: Vidal Schwalbe Other Clinician: Referring Josephmichael Lisenbee: Vidal Schwalbe Treating Josiah Wojtaszek/Extender: Skipper Cliche in Treatment: 19 Wound Status Wound Number: 1 Primary Pressure Ulcer Etiology: Wound Location: Right, Lateral Foot Wound Open Wounding Event: Pressure Injury Status: Date Acquired: 02/20/2020 Comorbid Arrhythmia, Congestive Heart Failure, Coronary Artery Weeks Of Treatment: 19 History: Disease, Hypertension, Myocardial Infarction, Peripheral Clustered Wound: No Venous Disease, Type II Diabetes, Neuropathy Photos Wound Measurements Length: (cm) 2 Width: (cm) 0.8 Depth: (cm) 0.2 Area: (cm) 1.257 Volume: (cm) 0.251 % Reduction in Area: 75.1% % Reduction in Volume: 90.1% Epithelialization: None Tunneling: No Undermining: No Wound Description Classification: Category/Stage IV Wound Margin: Distinct, outline attached Exudate Amount: Medium Exudate Type: Serosanguineous Exudate Color: red, brown Foul Odor After Cleansing: No Slough/Fibrino Yes Wound Bed Granulation Amount: Large (67-100%) Exposed Structure Granulation Quality:  Red Fascia Exposed: No Necrotic Amount: Small (1-33%) Fat Layer (Subcutaneous Tissue) Exposed: Yes Necrotic Quality: Adherent Slough Tendon Exposed: Yes Muscle Exposed: No Joint Exposed: Yes Bone Exposed: No Electronic Signature(s) Signed: 09/11/2020 4:57:18 PM By: Carlene Coria RN Entered By: Carlene Coria on  09/07/2020 14:58:39 Ralph Peterson ([id number]) -------------------------------------------------------------------------------- Wound Assessment Details Patient Name: Ralph Peterson. Date of Service: 09/07/2020 2:30 PM Medical Record Number: [id number] Patient Account Number: [id number] Date of Birth/Sex: 10/23/1965 (55 y.o. M) Treating RN: Carlene Coria Primary Care Syrah Daughtrey: Vidal Schwalbe Other Clinician: Referring Halford Goetzke: Vidal Schwalbe Treating Obdulio Mash/Extender: Skipper Cliche in Treatment: 19 Wound Status Wound Number: 2 Primary Open Surgical Wound Etiology: Wound Location: Right, Dorsal Foot Wound Open Wounding Event: Surgical Injury Status: Date Acquired: 04/20/2020 Comorbid Arrhythmia, Congestive Heart Failure, Coronary Artery Weeks Of Treatment: 19 History: Disease, Hypertension, Myocardial Infarction, Peripheral Clustered Wound: No Venous Disease, Type II Diabetes, Neuropathy Photos Wound Measurements Length: (cm) 0.2 Width: (cm) 0.2 Depth: (cm) 3 Area: (cm) 0.031 Volume: (cm) 0.094 % Reduction in Area: 99.3% % Reduction in Volume: 93.4% Epithelialization: Large (67-100%) Tunneling: No Undermining: No Wound Description Classification: Full Thickness Without Exposed Support Structu Wound Margin: Distinct, outline attached Exudate Amount: Large Exudate Type: Serosanguineous Exudate Color: red, brown res Foul Odor After Cleansing: No Slough/Fibrino Yes Wound Bed Granulation Amount: Large (67-100%) Exposed Structure Granulation Quality: Red Fascia Exposed: No Necrotic Amount: None Present (0%) Fat Layer (Subcutaneous Tissue) Exposed: Yes Tendon Exposed: No Muscle Exposed: No Joint Exposed: No Bone Exposed: No Electronic Signature(s) Signed: 09/11/2020 4:57:18 PM By: Carlene Coria RN Entered By: Carlene Coria on 09/07/2020 15:00:09 Ralph Peterson  ([id number]) -------------------------------------------------------------------------------- Wound Assessment Details Patient Name: Ralph Peterson. Date of Service: 09/07/2020 2:30 PM Medical Record Number: [id number] Patient Account Number: [id number] Date of Birth/Sex: 05-10-1966 (55 y.o. M) Treating RN: Carlene Coria Primary Care Ethelmae Ringel: Vidal Schwalbe Other Clinician: Referring Bernis Stecher: Vidal Schwalbe Treating Carnetta Losada/Extender: Skipper Cliche in Treatment: 19 Wound Status Wound Number: 7 Primary Diabetic Wound/Ulcer of the Lower Extremity Etiology: Wound Location: Right, Dorsal Foot Wound Open Wounding Event: Blister Status: Date Acquired: 09/05/2020 Comorbid Arrhythmia, Congestive Heart Failure, Coronary Artery Weeks Of Treatment: 0 History: Disease, Hypertension, Myocardial Infarction, Peripheral Clustered Wound: No Venous Disease, Type II Diabetes, Neuropathy Photos Wound Measurements Length: (cm) 4 Width: (cm) 5.5 Depth: (cm) 0.1 Area: (cm) 17.279 Volume: (cm) 1.728 % Reduction in Area: % Reduction in Volume: Epithelialization: None Tunneling: No Undermining: No Wound Description Classification: Grade 2 Exudate Amount: Medium Exudate Type: Serosanguineous Exudate Color: red, brown Foul Odor After Cleansing: No Slough/Fibrino Yes Wound Bed Granulation Amount: Large (67-100%) Exposed Structure Granulation Quality: Red Fascia Exposed: No Necrotic Amount: None Present (0%) Fat Layer (Subcutaneous Tissue) Exposed: Yes Tendon Exposed: No Muscle Exposed: No Joint Exposed: No Bone Exposed: No Electronic Signature(s) Signed: 09/11/2020 4:57:18 PM By: Carlene Coria RN Entered By: Carlene Coria on 09/07/2020 15:02:16 Ralph Peterson ([id number]) -------------------------------------------------------------------------------- Vitals Details Patient Name: Ralph Peterson Date of Service: 09/07/2020 2:30 PM Medical Record Number:  [id number] Patient Account Number: [id number] Date of Birth/Sex: October 16, 1965 (55 y.o. M) Treating RN: Dolan Amen Primary Care Nila Winker: Vidal Schwalbe Other Clinician: Referring Aqueelah Cotrell: Vidal Schwalbe Treating Khari Lett/Extender: Skipper Cliche in Treatment: 19 Vital Signs Time Taken: 14:35 Temperature (F): 98.1 Height (in): 72 Pulse (bpm): 80 Weight (lbs): 225 Respiratory Rate (breaths/min): 18 Body Mass Index (BMI): 30.5 Blood Pressure (mmHg): 117/77 Reference Range: 80 - 120 mg / dl Electronic Signature(s) Signed: 09/07/2020 4:36:17 PM By: Becky Sax,  Sallie RCP, RRT, CHT Entered By: Lorine Bears on 09/07/2020 14:39:57

## 2020-09-14 ENCOUNTER — Other Ambulatory Visit
Admission: RE | Admit: 2020-09-14 | Discharge: 2020-09-14 | Disposition: A | Discharge status: Home / Self Care | Payer: Medicare PPO | Source: Ambulatory Visit | Attending: Physician Assistant | Admitting: Physician Assistant

## 2020-09-14 ENCOUNTER — Ambulatory Visit (INDEPENDENT_AMBULATORY_CARE_PROVIDER_SITE_OTHER): Payer: Medicare PPO | Admitting: Nurse Practitioner

## 2020-09-14 ENCOUNTER — Encounter: Payer: Medicare PPO | Admitting: Physician Assistant

## 2020-09-14 ENCOUNTER — Ambulatory Visit (INDEPENDENT_AMBULATORY_CARE_PROVIDER_SITE_OTHER): Payer: Medicare PPO | Admitting: Vascular Surgery

## 2020-09-14 ENCOUNTER — Other Ambulatory Visit: Payer: Self-pay

## 2020-09-14 ENCOUNTER — Encounter (INDEPENDENT_AMBULATORY_CARE_PROVIDER_SITE_OTHER): Payer: Self-pay | Admitting: Nurse Practitioner

## 2020-09-14 VITALS — BP 111/71 | HR 75 | Resp 16

## 2020-09-14 DIAGNOSIS — G4734 Idiopathic sleep related nonobstructive alveolar hypoventilation: Secondary | ICD-10-CM | POA: Insufficient documentation

## 2020-09-14 DIAGNOSIS — N186 End stage renal disease: Secondary | ICD-10-CM

## 2020-09-14 DIAGNOSIS — B999 Unspecified infectious disease: Secondary | ICD-10-CM | POA: Insufficient documentation

## 2020-09-14 DIAGNOSIS — I10 Essential (primary) hypertension: Secondary | ICD-10-CM | POA: Diagnosis not present

## 2020-09-14 DIAGNOSIS — I7025 Atherosclerosis of native arteries of other extremities with ulceration: Secondary | ICD-10-CM

## 2020-09-14 DIAGNOSIS — Z6829 Body mass index (BMI) 29.0-29.9, adult: Secondary | ICD-10-CM | POA: Insufficient documentation

## 2020-09-14 DIAGNOSIS — F418 Other specified anxiety disorders: Secondary | ICD-10-CM | POA: Insufficient documentation

## 2020-09-14 DIAGNOSIS — E11621 Type 2 diabetes mellitus with foot ulcer: Secondary | ICD-10-CM | POA: Diagnosis not present

## 2020-09-14 NOTE — Progress Notes (Addendum)
KORBIN, LEISCHNER ([id number]) Visit Report for 09/14/2020 Chief Complaint Document Details Patient Name: Ralph Peterson, Ralph Peterson. Date of Service: 09/14/2020 10:15 AM Medical Record Number: [id number] Patient Account Number: [id number] Date of Birth/Sex: 10-Mar-1966 (55 y.o. 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 M) Treating RN: Dolan Amen Primary Care Provider: Vidal Schwalbe Other Clinician: Referring Provider: Vidal Schwalbe Treating Provider/Extender: Skipper Cliche in Treatment: 20 History of Present Illness HPI Description: 08/17/2020 upon evaluation today patient actually appears to be doing decently well today which is good news in regard to the dorsal wound which is almost healed and the lateral wound also doing much better. With regard to the plantar wound this is still draining purulent drainage unfortunately the culture did reveal that he had Pseudomonas as well as Enterococcus. With that being said he is allergic to penicillin which took away the only oral medication for the Enterococcus I did look at linezolid as a possibility but unfortunately there were medication interactions. Also the same was true the Cipro where there were medication interactions.  Nonetheless I want to see if we can get him into infectious disease to see if they can work with him to try to find possibly add at home IV antibiotic regimen that can help out at this point. No sharp debridement is can be necessary today. 09/07/2020 upon evaluation today patient appears to be doing unfortunately not too well in regard to his wound. He has been tolerating the dressing changes without complication. Fortunately there is no sign of active infection at this time. No fevers, chills, nausea, vomiting, or diarrhea. The patient did have a repeat surgery going to clean out the abscess and try to work on getting things moving in a better direction about a week and a half ago. He still has sutures in place. With that being said unfortunately he does not appear to be showing signs of a lot of improvement in my opinion. He does have sutures on the plantar aspect still in place and on the dorsal aspect he has a small opening which actually probes all the way down through and actually came out of the plantar aspect. I feel like that he really is having a lot of issues here and I am afraid that if we do not get this taken care of this is can end up with him having a much larger and more extensive amputation than what he was hoping to have to deal with with this. It has already been since June 2021 but has been dealing with this including all the time since the amputation. 09/14/2020 upon evaluation today patient appears to be doing really about the same in regard to the wound on the dorsal surface of his foot. The plantar foot unfortunately still has sutures in place I really cannot see how things are doing in that regard. With the dorsal foot however this appears to show signs of still having a small opening in the distal aspect which does actually probe down to  bone there is also significant purulent drainage we did actually obtain a wound culture today to further evaluate the issue here. Electronic  Signature(s) Signed: 09/14/2020 5:35:59 PM By: Worthy Keeler PA-C Entered By: Worthy Keeler on 09/14/2020 17:35:59 Ralph Peterson ([id number]) -------------------------------------------------------------------------------- Physical Exam Details Patient Name: Ralph Peterson Date of Service: 09/14/2020 10:15 AM Medical Record Number: [id number] Patient Account Number: [id number] Date of Birth/Sex: 11/02/1965 (55 y.o. M) Treating RN: Dolan Amen Primary Care Provider: Vidal Schwalbe Other Clinician: Referring Provider: Vidal Schwalbe Treating Provider/Extender: Skipper Cliche in Treatment: 78 Constitutional Well-nourished and well-hydrated in no acute distress. Respiratory normal breathing without difficulty. Psychiatric this patient is able to make decisions and demonstrates good insight into disease process. Alert and Oriented x 3. pleasant and cooperative. Notes Upon inspection patient's wound bed actually showed signs of good granulation at this time. There does not appear to be any evidence of active infection which is great news. In general I feel like that he seems to be moving in the correct direction with regard to the lateral foot but not as pleased with the dorsal foot wound. I am afraid that he may still have an issue here with fluid and purulent drainage collecting. Again we will not know what is going on in regard to the surgical site until we get this to the point of suture removal and then see how things appear at that point. Electronic Signature(s) Signed: 09/14/2020 5:37:02 PM By: Worthy Keeler PA-C Entered By: Worthy Keeler on 09/14/2020 17:37:01 Ralph Peterson ([id number]) -------------------------------------------------------------------------------- Physician Orders Details Patient Name: Ralph Peterson Date of Service: 09/14/2020 10:15 AM Medical Record Number: [id number] Patient Account Number: [id number] Date of Birth/Sex:  14-Sep-1965 (55 y.o. 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 Wound #2 Right,Distal,Dorsal Foot o Return Appointment in 1 week. Wound #5 Right,Plantar Foot o Return Appointment in 1 week. Wound #7 Right,Dorsal Foot o Return Appointment in 1 week. Bathing/ Shower/ Hygiene Wound #1 Right,Lateral Foot o Clean wound with Normal Saline or wound cleanser. Wound #2 Right,Distal,Dorsal Foot o Clean wound with Normal Saline or wound cleanser. Wound #5 Right,Plantar Foot o Clean wound with Normal Saline or wound cleanser. Wound #7 Right,Dorsal Foot o Clean wound with Normal Saline or wound cleanser. Off-Loading Wound #1 Right,Lateral Foot o Open toe surgical shoe with peg assist. Wound #2 Right,Distal,Dorsal Foot o Open toe surgical shoe with peg assist. Wound Treatment Wound #1 - Foot Wound Laterality: Right, Lateral Cleanser: Normal Saline (Generic) 3 x Per Week/30 Days Discharge Instructions: Wash your hands with soap and water. Remove old dressing, discard into plastic bag and place into trash. Cleanse the wound with Normal Saline prior to applying  a clean dressing using gauze sponges, not tissues or cotton balls. Do not scrub or use excessive force. [place]  dry using gauze sponges, not tissue or cotton balls. Primary Dressing: Silvercel Small 2x2 (in/in) 3 x Per Week/30 Days Discharge Instructions: Apply Silvercel Small 2x2 (in/in) as instructed Ralph Peterson, Ralph Peterson. ([id number]) Secondary Dressing: ABD Pad 5x9 (in/in) (Generic) 3 x Per Week/30 Days Discharge Instructions: Cover with ABD pad Secured With: Conforming Stretch Gauze Bandage 4x75 (in/in) (Generic) 3 x Per Week/30 Days Discharge Instructions: Apply as directed Wound #2 - Foot Wound Laterality: Dorsal, Right, Distal Cleanser: Normal Saline 3 x Per Week/30 Days Discharge Instructions: Wash your hands with soap and water. Remove old dressing, discard into plastic bag and place into trash. Cleanse the wound with Normal Saline prior to applying a clean dressing using gauze sponges, not tissues or cotton balls. Do not scrub or use excessive force. Pat dry using gauze sponges, not tissue or cotton balls. Primary Dressing: Silvercel 4 1/4x 4 1/4 (in/in) 3 x Per Week/30 Days Discharge Instructions: Apply Silvercel 4 1/4x 4 1/4 (in/in) as instructed Secondary Dressing: ABD Pad 5x9 (in/in) (Generic) 3 x Per Week/30 Days Discharge Instructions: Cover with ABD pad Secured With: Conforming Stretch Gauze Bandage 4x75 (in/in) (Generic) 3 x Per Week/30 Days Discharge Instructions: Apply as directed Wound #7 - Foot Wound Laterality: Dorsal, Right Cleanser: Normal Saline 3 x Per Week/30 Days Discharge Instructions: Wash your hands with soap and water. Remove old dressing, discard into plastic bag and place into trash. Cleanse the wound with Normal Saline prior to applying a clean dressing using gauze sponges, not tissues or cotton balls. Do not scrub or use excessive force. Pat dry using gauze sponges, not tissue or cotton balls. Primary Dressing: Silvercel 4 1/4x 4 1/4 (in/in) 3 x Per Week/30  Days Discharge Instructions: Apply Silvercel 4 1/4x 4 1/4 (in/in) as instructed Secondary Dressing: ABD Pad 5x9 (in/in) (Generic) 3 x Per Week/30 Days Discharge Instructions: Cover with ABD pad Secured With: Conforming Stretch Gauze Bandage 4x75 (in/in) (Generic) 3 x Per Week/30 Days Discharge Instructions: Apply as directed Laboratory o Bacteria identified in Wound by Culture (MICRO) - right foot dorsal oooo LOINC Code: [id number] oooo Convenience Name: Wound culture routine Electronic Signature(s) Signed: 09/14/2020 4:52:24 PM By: Charlett Nose RN Signed: 09/14/2020 5:47:45 PM By: Worthy Keeler PA-C Entered By: Georges Mouse, Minus Breeding on 09/14/2020 11:12:00 Ralph Peterson ([id number]) -------------------------------------------------------------------------------- Problem List Details Patient Name: Ralph Peterson. Date of Service: 09/14/2020 10:15 AM Medical Record Number: [id number] Patient Account Number: [id number] Date of Birth/Sex: 11-Dec-1965 (55 y.o. M) Treating RN: Dolan Amen Primary Care Provider: Vidal Schwalbe Other Clinician: Referring Provider: Vidal Schwalbe Treating Provider/Extender: Skipper Cliche in Treatment: 20 Active Problems ICD-10 Encounter Code Description Active Date MDM Diagnosis L89.893 Pressure ulcer of other site, stage 3 04/27/2020 No Yes T81.31XA Disruption of external operation (surgical) wound, not elsewhere 04/27/2020 No Yes classified, initial encounter L97.512 Non-pressure chronic ulcer of other part of right foot with fat layer 04/27/2020 No Yes exposed L97.812 Non-pressure chronic ulcer of other part of right lower leg with fat layer 04/27/2020 No Yes exposed E11.621 Type 2 diabetes mellitus with foot ulcer 04/27/2020 No Yes I50.42 Chronic combined systolic (congestive) and diastolic (congestive) heart 04/27/2020 No Yes failure N18.6 End stage renal disease 04/27/2020 No Yes Z99.2 Dependence on renal dialysis 04/27/2020  No Yes Inactive Problems Resolved Problems Electronic Signature(s) Signed: 09/14/2020 11:03:29 AM By: Worthy Keeler PA-C Entered By: Worthy Keeler on 09/14/2020 11:03:28 Ralph Peterson ([id number]) -------------------------------------------------------------------------------- Progress Note Details Patient Name: Ralph Peterson Date of Service: 09/14/2020  10:15 AM Medical Record Number: [id number] Patient Account Number: [id number] Date of Birth/Sex: 03-25-1966 (56 y.o. 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 With regard to the plantar wound this is still draining purulent drainage unfortunately the culture did reveal that he had Pseudomonas as well as Enterococcus. With that being said he is allergic to penicillin which took away the only oral medication for the Enterococcus I did look at linezolid as a possibility but unfortunately there were medication interactions. Also the same was true the Cipro where there were medication interactions. Nonetheless I want to see if we can get him into infectious disease to see if they can work with him to try to find possibly add at home IV antibiotic regimen that can help out at this point. No sharp debridement is can be necessary today. 09/07/2020 upon evaluation today patient appears to be doing unfortunately not too well in regard to his wound. He has been tolerating the dressing changes without complication.  Fortunately there is no sign of active infection at this time. No fevers, chills, nausea, vomiting, or diarrhea. The patient did have a repeat surgery going to clean out the abscess and try to work on getting things moving in a better direction about a week and a half ago. He still has sutures in place. With that being said unfortunately he does not appear to be showing signs of a lot of improvement in my opinion. He does have sutures on the plantar aspect still in place and on the dorsal aspect he has a small opening which actually probes all the way down through and actually came out of the plantar aspect. I feel like that he really is having a lot of issues here and I am afraid that if we do not get this taken care of this is can end up with him having a much larger and more extensive amputation than what he was hoping to have to deal with with this. It has already been since June 2021 but has been dealing with this including all the time since the amputation. 09/14/2020 upon evaluation today patient appears to be doing really about the same in regard to the wound on the dorsal surface of his foot. The plantar foot unfortunately still has sutures in place I really cannot see how things are doing in that regard. With the dorsal foot however this appears to show signs of still having a small opening in the distal aspect which does actually probe down to bone there is also significant purulent drainage we did actually obtain a wound culture today to further evaluate the issue here. Objective Constitutional Well-nourished and well-hydrated in no acute distress. Vitals Time Taken: 10:27 AM, Height: 72 in, Weight: 225 lbs, BMI: 30.5, Temperature: 98.2 F, Pulse: 74 bpm, Respiratory Rate: 16 breaths/min, Blood Pressure: 127/85 mmHg. Respiratory normal breathing without difficulty. Psychiatric this patient is able to make decisions and demonstrates good insight into disease process. Alert and Oriented  x 3. pleasant and cooperative. General Notes: Upon inspection patient's wound bed actually showed signs  of good granulation at this time. There does not appear to be any evidence of active infection which is great news. In general I feel like that he seems to be moving in the correct direction with regard to the lateral foot but not as pleased with the dorsal foot wound. I am afraid that he may still have an issue here with fluid and purulent drainage collecting. Again we will not know what is going on in regard to the surgical site until we get this to the point of suture removal and then see how things appear at that point. Integumentary (Hair, Skin) Wound #1 status is Open. Original cause of wound was Pressure Injury. The date acquired was: 02/20/2020. The wound has been in treatment 20 weeks. The wound is located on the Right,Lateral Foot. The wound measures 2cm length x 0.9cm width x 0.1cm depth; 1.414cm^2 area and 0.141cm^3 volume. There is Fat Layer (Subcutaneous Tissue) exposed. There is no tunneling or undermining noted. There is a medium amount of serosanguineous drainage noted. The wound margin is distinct with the outline attached to the wound base. There is large (67-100%) red, pink, pale granulation within the wound bed. There is a small (1-33%) amount of necrotic tissue within the wound bed including Adherent Slough. Ralph Peterson, Ralph Peterson ([id number]) Wound #2 status is Open. Original cause of wound was Surgical Injury. The date acquired was: 04/20/2020. The wound has been in treatment 20 weeks. The wound is located on the Right,Distal,Dorsal Foot. The wound measures 0.3cm length x 0.2cm width x 2.5cm depth; 0.047cm^2 area and 0.118cm^3 volume. There is Fat Layer (Subcutaneous Tissue) exposed. There is no tunneling or undermining noted. There is a medium amount of purulent drainage noted. The wound margin is distinct with the outline attached to the wound base. There is medium (34-66%)  pink granulation within the wound bed. There is a medium (34-66%) amount of necrotic tissue within the wound bed including Adherent Slough. Wound #5 status is Healed - Surgical Closure. Original cause of wound was Surgical Injury. The date acquired was: 03/24/2020. The wound has been in treatment 17 weeks. The wound is located on the [place]. The wound measures 0cm length x 0cm width x 0cm depth; 0cm^2 area and 0cm^3 volume. Wound #7 status is Open. Original cause of wound was Blister. The date acquired was: 09/05/2020. The wound has been in treatment 1 weeks. The wound is located on the Right,Dorsal Foot. The wound measures 3cm length x 0.5cm width x 0.1cm depth; 1.178cm^2 area and 0.118cm^3 volume. There is Fat Layer (Subcutaneous Tissue) exposed. There is no tunneling or undermining noted. There is a medium amount of serosanguineous drainage noted. There is medium (34-66%) granulation within the wound bed. There is a medium (34-66%) amount of necrotic tissue within the wound bed including Eschar and Adherent Slough. 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 Wound #2 Right,Distal,Dorsal Foot: Return Appointment in 1 week. Wound #5 Right,Plantar Foot: Return Appointment in 1 week. Wound #7 Right,Dorsal Foot: Return Appointment in 1 week. Bathing/ Shower/ Hygiene: Wound #  1 Right,Lateral Foot: Clean wound with Normal Saline or wound  cleanser. Wound #2 Right,Distal,Dorsal Foot: Clean wound with Normal Saline or wound cleanser. Wound #5 Right,Plantar Foot: Clean wound with Normal Saline or wound cleanser. Wound #7 Right,Dorsal Foot: Clean wound with Normal Saline or wound cleanser. Off-Loading: Wound #1 Right,Lateral Foot: Open toe surgical shoe with peg assist. Wound #2 Right,Distal,Dorsal Foot: Open toe surgical shoe with peg assist. Laboratory ordered were: Wound culture routine - right foot dorsal WOUND #1: - Foot Wound Laterality: Right, Lateral Cleanser: Normal Saline (Generic) 3 x Per Week/30 Days Discharge Instructions: Wash your hands with soap and water. Remove old dressing, discard into plastic bag and place into trash. Cleanse the wound with Normal Saline prior to applying a clean dressing using gauze sponges, not tissues or cotton balls. Do not scrub or use excessive force. Pat dry using gauze sponges, not tissue or cotton balls. Primary Dressing: Silvercel Small 2x2 (in/in) 3 x Per Week/30 Days Discharge Instructions: Apply Silvercel Small 2x2 (in/in) as instructed Secondary Dressing: ABD Pad 5x9 (in/in) (Generic) 3 x Per Week/30 Days Discharge Instructions: Cover with ABD pad Secured With: Conforming Stretch Gauze Bandage 4x75 (in/in) (Generic) 3 x Per Week/30 Days Discharge Instructions: Apply as directed Ralph Peterson, Ralph Peterson ([id number]) WOUND #2: - Foot Wound Laterality: Dorsal, Right, Distal Cleanser: Normal Saline 3 x Per Week/30 Days Discharge Instructions: Wash your hands with soap and water. Remove old dressing, discard into plastic bag and place into trash. Cleanse the wound with Normal Saline prior to applying a clean dressing using gauze sponges, not tissues or cotton balls. Do not scrub or use excessive force. Pat dry using gauze sponges, not tissue or cotton balls. Primary Dressing: Silvercel 4 1/4x 4 1/4 (in/in) 3 x Per Week/30 Days Discharge Instructions: Apply Silvercel 4 1/4x 4  1/4 (in/in) as instructed Secondary Dressing: ABD Pad 5x9 (in/in) (Generic) 3 x Per Week/30 Days Discharge Instructions: Cover with ABD pad Secured With: Conforming Stretch Gauze Bandage 4x75 (in/in) (Generic) 3 x Per Week/30 Days Discharge Instructions: Apply as directed WOUND #7: - Foot Wound Laterality: Dorsal, Right Cleanser: Normal Saline 3 x Per Week/30 Days Discharge Instructions: Wash your hands with soap and water. Remove old dressing, discard into plastic bag and place into trash. Cleanse the wound with Normal Saline prior to applying a clean dressing using gauze sponges, not tissues or cotton balls. Do not scrub or use excessive force. Pat dry using gauze sponges, not tissue or cotton balls. Primary Dressing: Silvercel 4 1/4x 4 1/4 (in/in) 3 x Per Week/30 Days Discharge Instructions: Apply Silvercel 4 1/4x 4 1/4 (in/in) as instructed Secondary Dressing: ABD Pad 5x9 (in/in) (Generic) 3 x Per Week/30 Days Discharge Instructions: Cover with ABD pad Secured With: Conforming Stretch Gauze Bandage 4x75 (in/in) (Generic) 3 x Per Week/30 Days Discharge Instructions: Apply as directed 1. I would recommend at this time that we have the patient go ahead and continue with the wound care measures as before utilizing the alginate to all wound locations. Again for the time being I think that is keeping it clean and dry. 2. With regard to the surgical site again we cannot really tell much about what is going on here until the sutures come out I think is good to be another 2 weeks before that occurrence. 3. The patient also will be having a second opinion at [facility] although the schedule for about a month out from current. 4. I am going to have a this does and what it  shows and if we need to make any adjustments as necessary the treatment plan we will do so when we get the results back. We will see patient back for reevaluation in 1 week here in the clinic. If anything worsens or changes patient will  contact our office for additional recommendations. Electronic Signature(s) Signed: 09/14/2020 5:38:02 PM By: Worthy Keeler PA-C Entered By: Worthy Keeler on 09/14/2020 17:38:02 Ralph Peterson ([id number]) -------------------------------------------------------------------------------- SuperBill Details Patient Name: Ralph Peterson Date of Service: 09/14/2020 Medical Record Number: [id number] Patient Account Number: [id number] Date of Birth/Sex: 01/25/1966 (55 y.o. 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 N18.6 End stage renal disease Z99.2 Dependence on renal dialysis Facility Procedures CPT4 Code: [id number] Description: 99214 - WOUND CARE VISIT-LEV 4 EST PT Modifier: Quantity: 1 Physician Procedures CPT4 Code: [id number] Description: 99213 - WC PHYS LEVEL 3 - EST PT Modifier: Quantity: 1 CPT4 Code: Description: ICD-10 Diagnosis Description L89.893 Pressure ulcer of other site, stage 3 T81.31XA Disruption of external operation (surgical) wound, not elsewhere classifi 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

## 2020-09-14 NOTE — Progress Notes (Addendum)
Ralph Peterson ([id number]) Visit Report for 09/14/2020 Arrival Information Details Patient Name: Ralph Peterson. Date of Service: 09/14/2020 10:15 AM Medical Record Number: [id number] Patient Account Number: [id number] Date of Birth/Sex: 01-12-1966 (55 y.o. M) Treating RN: Carlene Coria Primary Care : Vidal Schwalbe Other Clinician: Referring : Vidal Schwalbe Treating /Extender: Skipper Cliche in Treatment: 20 Visit Information History Since Last Visit All ordered tests and consults were completed: No Patient Arrived: Wheel Chair Added or deleted any medications: No Arrival Time: 10:26 Any new allergies or adverse reactions: No Accompanied By: son Had a fall or experienced change in No Transfer Assistance: None activities of daily living that may affect Patient Identification Verified: Yes risk of falls: Secondary Verification Process Completed: Yes Signs or symptoms of abuse/neglect since last visito No Patient Requires Transmission-Based Precautions: No Hospitalized since last visit: No Patient Has Alerts: No Implantable device outside of the clinic excluding No cellular tissue based products placed in the center since last visit: Has Dressing in Place as Prescribed: Yes Has Compression in Place as Prescribed: Yes Pain Present Now: No Electronic Signature(s) Signed: 09/14/2020 11:27:08 AM By: Carlene Coria RN Entered By: Carlene Coria on 09/14/2020 10:27:47 Ralph Peterson ([id number]) -------------------------------------------------------------------------------- Clinic Level of Care Assessment Details Patient Name: Ralph Peterson Date of Service: 09/14/2020 10:15 AM Medical Record Number: [id number] Patient Account Number: [id number] Date of Birth/Sex: [birth year] (55 y.o. M) Treating RN: Dolan Amen Primary Care : Vidal Schwalbe Other Clinician: Referring : Vidal Schwalbe Treating /Extender:  Skipper Cliche in Treatment: 20 Clinic Level of Care Assessment Items TOOL 4 Quantity Score X - Use when only an EandM is performed on FOLLOW-UP visit 1 0 ASSESSMENTS - Nursing Assessment / Reassessment X - Reassessment of Co-morbidities (includes updates in patient status) 1 10 X- 1 5 Reassessment of Adherence to Treatment Plan ASSESSMENTS - Wound and Skin Assessment / Reassessment '[]'$  - Simple Wound Assessment / Reassessment - one wound 0 X- 4 5 Complex Wound Assessment / Reassessment - multiple wounds '[]'$  - 0 Dermatologic / Skin Assessment (not related to wound area) ASSESSMENTS - Focused Assessment '[]'$  - Circumferential Edema Measurements - multi extremities 0 '[]'$  - 0 Nutritional Assessment / Counseling / Intervention '[]'$  - 0 Lower Extremity Assessment (monofilament, tuning fork, pulses) '[]'$  - 0 Peripheral Arterial Disease Assessment (using hand held doppler) ASSESSMENTS - Ostomy and/or Continence Assessment and Care '[]'$  - Incontinence Assessment and Management 0 '[]'$  - 0 Ostomy Care Assessment and Management (repouching, etc.) PROCESS - Coordination of Care X - Simple Patient / Family Education for ongoing care 1 15 '[]'$  - 0 Complex (extensive) Patient / Family Education for ongoing care '[]'$  - 0 Staff obtains Programmer, systems, Records, Test Results / Process Orders '[]'$  - 0 Staff telephones HHA, Nursing Homes / Clarify orders / etc '[]'$  - 0 Routine Transfer to another Facility (non-emergent condition) '[]'$  - 0 Routine Hospital Admission (non-emergent condition) '[]'$  - 0 New Admissions / Biomedical engineer / Ordering NPWT, Apligraf, etc. '[]'$  - 0 Emergency Hospital Admission (emergent condition) X- 1 10 Simple Discharge Coordination '[]'$  - 0 Complex (extensive) Discharge Coordination PROCESS - Special Needs '[]'$  - Pediatric / Minor Patient Management 0 '[]'$  - 0 Isolation Patient Management '[]'$  - 0 Hearing / Language / Visual special needs '[]'$  - 0 Assessment of Community assistance  (transportation, D/C planning, etc.) '[]'$  - 0 Additional assistance / Altered mentation '[]'$  - 0 Support Surface(s) Assessment (bed, cushion, seat, etc.) INTERVENTIONS - Wound Cleansing / Measurement [name], [name] ([id number]) '[]'$  -  0 Simple Wound Cleansing - one wound X- 4 5 Complex Wound Cleansing - multiple wounds X- 1 5 Wound Imaging (photographs - any number of wounds) '[]'$  - 0 Wound Tracing (instead of photographs) '[]'$  - 0 Simple Wound Measurement - one wound X- 4 5 Complex Wound Measurement - multiple wounds INTERVENTIONS - Wound Dressings '[]'$  - Small Wound Dressing one or multiple wounds 0 X- 1 15 Medium Wound Dressing one or multiple wounds '[]'$  - 0 Large Wound Dressing one or multiple wounds '[]'$  - 0 Application of Medications - topical '[]'$  - 0 Application of Medications - injection INTERVENTIONS - Miscellaneous '[]'$  - External ear exam 0 X- 1 5 Specimen Collection (cultures, biopsies, blood, body fluids, etc.) X- 1 5 Specimen(s) / Culture(s) sent or taken to Lab for analysis '[]'$  - 0 Patient Transfer (multiple staff / Civil Service fast streamer / Similar devices) '[]'$  - 0 Simple Staple / Suture removal (25 or less) '[]'$  - 0 Complex Staple / Suture removal (26 or more) '[]'$  - 0 Hypo / Hyperglycemic Management (close monitor of Blood Glucose) '[]'$  - 0 Ankle / Brachial Index (ABI) - do not check if billed separately X- 1 5 Vital Signs Has the patient been seen at the hospital within the last three years: Yes Total Score: 135 Level Of Care: New/Established - Level 4 Electronic Signature(s) Signed: 09/14/2020 4:52:24 PM By: Georges Mouse, Minus Breeding RN Entered By: Georges Mouse, Kenia on 09/14/2020 11:13:37 Ralph Peterson ([id number]) -------------------------------------------------------------------------------- Encounter Discharge Information Details Patient Name: Ralph Peterson Date of Service: 09/14/2020 10:15 AM Medical Record Number: [id number] Patient Account  Number: [id number] Date of Birth/Sex: [birth year] (55 y.o. M) Treating RN: Dolan Amen Primary Care : Vidal Schwalbe Other Clinician: Referring : Vidal Schwalbe Treating /Extender: Skipper Cliche in Treatment: 20 Encounter Discharge Information Items Discharge Condition: Stable Ambulatory Status: Wheelchair Discharge Destination: Home Transportation: Private Auto Accompanied By: son Schedule Follow-up Appointment: Yes Clinical Summary of Care: Electronic Signature(s) Signed: 09/14/2020 4:52:24 PM By: Georges Mouse, Minus Breeding RN Entered By: Georges Mouse, Minus Breeding on 09/14/2020 11:14:36 Ralph Peterson ([id number]) -------------------------------------------------------------------------------- Lower Extremity Assessment Details Patient Name: Ralph Peterson Date of Service: 09/14/2020 10:15 AM Medical Record Number: [id number] Patient Account Number: [id number] Date of Birth/Sex: 1965-09-22 (55 y.o. M) Treating RN: Carlene Coria Primary Care : Vidal Schwalbe Other Clinician: Referring : Vidal Schwalbe Treating /Extender: Skipper Cliche in Treatment: 20 Edema Assessment Assessed: [Left: No] [Right: No] [Left: Edema] [Right: :] Calf Left: Right: Point of Measurement: 40 cm From Medial Instep 30 cm Ankle Left: Right: Point of Measurement: 10 cm From Medial Instep 20.4 cm Electronic Signature(s) Signed: 09/14/2020 11:27:08 AM By: Carlene Coria RN Entered By: Carlene Coria on 09/14/2020 10:45:21 Ralph Peterson ([id number]) -------------------------------------------------------------------------------- Multi Wound Chart Details Patient Name: Ralph Peterson. Date of Service: 09/14/2020 10:15 AM Medical Record Number: [id number] Patient Account Number: [id number] Date of Birth/Sex: September 22, 1965 (55 y.o. 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 Comorbid History: Arrhythmia, Congestive Heart Arrhythmia, Congestive Heart Arrhythmia, Congestive Heart Failure, Coronary Artery Disease, Failure, Coronary Artery Disease, Failure, Coronary Artery Disease, Hypertension, Myocardial  Infarction, Hypertension, Myocardial Infarction, Hypertension, Myocardial Infarction, Peripheral Venous Disease, Type II Peripheral Venous Disease, Type II Peripheral Venous Disease, Type II Diabetes, Neuropathy Diabetes, Neuropathy Diabetes, Neuropathy Date Acquired: 02/20/2020 04/20/2020 09/05/2020 Weeks of Treatment: '20 20 1 '$ Wound Status: Open Open Open Measurements L x W x D (cm) 2x0.9x0.1 0.3x0.2x2.5 3x0.5x0.1 Area (cm) : 1.414 0.047 1.178 Volume (cm) : 0.141 0.118 0.118 % Reduction in Area: 72.00% 99.00% 93.20% % Reduction in Volume: 94.40% 91.70% 93.20% Classification: Category/Stage IV Full Thickness Without Exposed Grade 2 Support Structures Exudate Amount: Medium Medium Medium Exudate Type: Serosanguineous Purulent Serosanguineous Exudate Color: red, brown yellow, brown, green red, brown Wound Margin: Distinct, outline attached Distinct, outline attached N/A Granulation Amount: Large (67-100%) Medium (34-66%) Medium (34-66%) Granulation Quality: Red, Pink, Pale Pink N/A Necrotic Amount: Small (1-33%) Medium (34-66%) Medium (34-66%) Necrotic Tissue: Adherent [place] Exposed Structures: Fat Layer (Subcutaneous Tissue): Fat  Layer (Subcutaneous Tissue): Fat Layer (Subcutaneous Tissue): Yes Yes Yes Fascia: No Fascia: No Fascia: No Tendon: No Tendon: No Tendon: No Muscle: No Muscle: No Muscle: No Joint: No Joint: No Joint: No Bone: No Bone: No Bone: No Epithelialization: None Large (67-100%) None Treatment Notes Electronic Signature(s) Signed: 09/14/2020 4:52:24 PM By: Georges Mouse, Minus Breeding RN Ralph Peterson ([id number]) Entered By: Georges Mouse, Minus Breeding on 09/14/2020 11:10:44 Ralph Peterson ([id number]) -------------------------------------------------------------------------------- Multi-Disciplinary Care Plan Details Patient Name: Ralph Peterson Date of Service: 09/14/2020 10:15 AM Medical Record Number: [id number] Patient Account Number: [id number] Date of Birth/Sex: 05/26/1966 (54 y.o. M) Treating RN: Dolan Amen Primary Care : Vidal Schwalbe Other Clinician: Referring : Vidal Schwalbe Treating /Extender: Skipper Cliche in Treatment: 20 Active Inactive Necrotic Tissue Nursing Diagnoses: Impaired tissue integrity related to necrotic/devitalized tissue Knowledge deficit related to management of necrotic/devitalized tissue Goals: Necrotic/devitalized tissue will be minimized in the wound bed Date Initiated: 05/09/2020 Target Resolution Date: 05/09/2020 Goal Status: Active Interventions: Assess patient pain level pre-, during and post procedure and prior to discharge Provide education on necrotic tissue and debridement process Treatment Activities: Enzymatic debridement : 05/09/2020 Notes: Wound/Skin Impairment Nursing Diagnoses: Impaired tissue integrity Knowledge deficit related to ulceration/compromised skin integrity Goals: Patient/caregiver will verbalize understanding of skin care regimen Date Initiated: 04/27/2020 Target Resolution Date: 05/25/2020 Goal Status: Active Interventions: Assess patient/caregiver ability to  obtain necessary supplies Assess patient/caregiver ability to perform ulcer/skin care regimen upon admission and as needed Assess ulceration(s) every visit Provide education on ulcer and skin care Treatment Activities: Skin care regimen initiated : 04/27/2020 Topical wound management initiated : 04/27/2020 Notes: Electronic Signature(s) Signed: 09/14/2020 4:52:24 PM By: Georges Mouse, Minus Breeding RN Entered By: Georges Mouse, Minus Breeding on 09/14/2020 11:05:01 Ralph Peterson ([id number]) -------------------------------------------------------------------------------- Pain Assessment Details Patient Name: Ralph Peterson Date of Service: 09/14/2020 10:15 AM Medical Record Number: [id number] Patient Account Number: [id number] Date of Birth/Sex: [birth year] (55 y.o. M) Treating RN: Carlene Coria Primary Care : Vidal Schwalbe Other Clinician: Referring : Vidal Schwalbe Treating /Extender: Skipper Cliche in Treatment: 20 Active Problems Location of Pain Severity and Description of Pain Patient Has Paino No Site Locations Pain Management and Medication Current Pain Management: Electronic Signature(s) Signed: 09/14/2020 11:27:08 AM By: Carlene Coria RN Entered By: Carlene Coria on 09/14/2020 10:29:58 Ralph Peterson ([id number]) -------------------------------------------------------------------------------- Patient/Caregiver Education Details Patient Name: Ralph Peterson Date of Service: 09/14/2020 10:15 AM Medical Record Number: [id number] Patient Account Number: [id number] Date of Birth/Gender: March 04, 1966 (55 y.o. M) Treating RN: Dolan Amen Primary Care Physician: Vidal Schwalbe Other Clinician: Referring  Physician: Vidal Schwalbe Treating Physician/Extender: Skipper Cliche in Treatment: 20 Education Assessment Education Provided To: Patient Education Topics Provided Wound/Skin Impairment: Methods: Explain/Verbal Responses: State  content correctly Electronic Signature(s) Signed: 09/14/2020 4:52:24 PM By: Georges Mouse, Minus Breeding RN Entered By: Georges Mouse, Minus Breeding on 09/14/2020 11:13:56 Ralph Peterson ([id number]) -------------------------------------------------------------------------------- Wound Assessment Details Patient Name: Ralph Peterson Date of Service: 09/14/2020 10:15 AM Medical Record Number: [id number] Patient Account Number: [id number] Date of Birth/Sex: 08/18/1965 (55 y.o. M) Treating RN: Carlene Coria Primary Care : Vidal Schwalbe Other Clinician: Referring : Vidal Schwalbe Treating /Extender: Skipper Cliche in Treatment: 20 Wound Status Wound Number: 1 Primary Pressure Ulcer Etiology: Wound Location: Right, Lateral Foot Wound Open Wounding Event: Pressure Injury Status: Date Acquired: 02/20/2020 Comorbid Arrhythmia, Congestive Heart Failure, Coronary Artery Weeks Of Treatment: 20 History: Disease, Hypertension, Myocardial Infarction, Peripheral Clustered Wound: No Venous Disease, Type II Diabetes, Neuropathy Photos Wound Measurements Length: (cm) 2 Width: (cm) 0.9 Depth: (cm) 0.1 Area: (cm) 1.414 Volume: (cm) 0.141 % Reduction in Area: 72% % Reduction in Volume: 94.4% Epithelialization: None Tunneling: No Undermining: No Wound Description Classification: Category/Stage IV Wound Margin: Distinct, outline attached Exudate Amount: Medium Exudate Type: Serosanguineous Exudate Color: red, brown Foul Odor After Cleansing: No Slough/Fibrino Yes Wound Bed Granulation Amount: Large (67-100%) Exposed Structure Granulation Quality: Red, Pink, Pale Fascia Exposed: No Necrotic Amount: Small (1-33%) Fat Layer (Subcutaneous Tissue) Exposed: Yes Necrotic Quality: Adherent Slough Tendon Exposed: No Muscle Exposed: No Joint Exposed: No Bone Exposed: No Treatment Notes Wound #1 (Foot) Wound Laterality: Right, Lateral Cleanser Normal  Saline Discharge Instruction: Wash your hands with soap and water. Remove old dressing, discard into plastic bag and place into trash. Cleanse the wound with Normal Saline prior to applying a clean dressing using gauze sponges, not tissues or cotton balls. Do not Wilsey, Ariyan S. ([id number]) scrub or use excessive force. Pat dry using gauze sponges, not tissue or cotton balls. Peri-Wound Care Topical Primary Dressing Silvercel Small 2x2 (in/in) Discharge Instruction: Apply Silvercel Small 2x2 (in/in) as instructed Secondary Dressing ABD Pad 5x9 (in/in) Discharge Instruction: Cover with ABD pad Secured With Conforming Stretch Gauze Bandage 4x75 (in/in) Discharge Instruction: Apply as directed Compression Wrap Compression Stockings Add-Ons Electronic Signature(s) Signed: 09/14/2020 11:27:08 AM By: Carlene Coria RN Entered By: Carlene Coria on 09/14/2020 10:43:49 Ralph Peterson ([id number]) -------------------------------------------------------------------------------- Wound Assessment Details Patient Name: Ralph Peterson. Date of Service: 09/14/2020 10:15 AM Medical Record Number: [id number] Patient Account Number: [id number] Date of Birth/Sex: [birth year] (55 y.o. M) Treating RN: Carlene Coria Primary Care : Vidal Schwalbe Other Clinician: Referring : Vidal Schwalbe Treating /Extender: Skipper Cliche in Treatment: 20 Wound Status Wound Number: 2 Primary Open Surgical Wound Etiology: Wound Location: Right, Distal, Dorsal Foot Wound Open Wounding Event: Surgical Injury Status: Date Acquired: 04/20/2020 Comorbid Arrhythmia, Congestive Heart Failure, Coronary Artery Weeks Of Treatment: 20 History: Disease, Hypertension, Myocardial Infarction, Peripheral Clustered Wound: No Venous Disease, Type II Diabetes, Neuropathy Photos Wound Measurements Length: (cm) 0.3 Width: (cm) 0.2 Depth: (cm) 2.5 Area: (cm) 0.047 Volume: (cm) 0.118 %  Reduction in Area: 99% % Reduction in Volume: 91.7% Epithelialization: Large (67-100%) Tunneling: No Undermining: No Wound Description Classification: Full Thickness Without Exposed Support Structures Wound Margin: Distinct, outline attached Exudate Amount: Medium Exudate Type: Purulent Exudate Color: yellow, brown, green Foul Odor After Cleansing: No Slough/Fibrino Yes Wound Bed Granulation Amount: Medium (34-66%) Exposed Structure Granulation Quality: Pink Fascia Exposed: No Necrotic Amount: Medium (34-66%) Fat Layer (Subcutaneous Tissue) Exposed: Yes Necrotic Quality:  Adherent Slough Tendon Exposed: No Muscle Exposed: No Joint Exposed: No Bone Exposed: No Treatment Notes Wound #2 (Foot) Wound Laterality: Dorsal, Right, Distal Cleanser Normal Saline Discharge Instruction: Wash your hands with soap and water. Remove old dressing, discard into plastic bag and place into trash. Cleanse the wound with Normal Saline prior to applying a clean dressing using gauze sponges, not tissues or cotton balls. Do not Bessler, Rosbel S. ([id number]) scrub or use excessive force. Pat dry using gauze sponges, not tissue or cotton balls. Peri-Wound Care Topical Primary Dressing Silvercel 4 1/4x 4 1/4 (in/in) Discharge Instruction: Apply Silvercel 4 1/4x 4 1/4 (in/in) as instructed Secondary Dressing ABD Pad 5x9 (in/in) Discharge Instruction: Cover with ABD pad Secured With Conforming Stretch Gauze Bandage 4x75 (in/in) Discharge Instruction: Apply as directed Compression Wrap Compression Stockings Add-Ons Electronic Signature(s) Signed: 09/14/2020 11:27:08 AM By: Carlene Coria RN Entered By: Carlene Coria on 09/14/2020 10:44:18 Ralph Peterson ([id number]) -------------------------------------------------------------------------------- Wound Assessment Details Patient Name: Ralph Peterson. Date of Service: 09/14/2020 10:15 AM Medical Record Number: [id number] Patient  Account Number: [id number] Date of Birth/Sex: August 12, 1965 (55 y.o. M) Treating RN: Dolan Amen Primary Care : Vidal Schwalbe Other Clinician: Referring : Vidal Schwalbe Treating /Extender: Skipper Cliche in Treatment: 20 Wound Status Wound Number: 5 Primary Etiology: Diabetic Wound/Ulcer of the Lower Extremity Wound Location: Right, Plantar Foot Wound Status: Healed - Surgical Closure Wounding Event: Surgical Injury Date Acquired: 03/24/2020 Weeks Of Treatment: 17 Clustered Wound: No Wound Measurements Length: (cm) 0 Width: (cm) 0 Depth: (cm) 0 Area: (cm) 0 Volume: (cm) 0 % Reduction in Area: 100% % Reduction in Volume: 100% Wound Description Classification: Grade 2 Treatment Notes Wound #5 (Foot) Wound Laterality: Plantar, Right Cleanser Peri-Wound Care Topical Primary Dressing Secondary Dressing Secured With Compression Wrap Compression Stockings Add-Ons Electronic Signature(s) Signed: 09/14/2020 5:47:45 PM By: Worthy Keeler PA-C Signed: 09/18/2020 3:31:49 PM By: Georges Mouse, Minus Breeding RN Entered By: Worthy Keeler on 09/14/2020 17:36:30 Ralph Peterson ([id number]) -------------------------------------------------------------------------------- Wound Assessment Details Patient Name: Ralph Peterson. Date of Service: 09/14/2020 10:15 AM Medical Record Number: [id number] Patient Account Number: [id number] Date of Birth/Sex: August 06, 1965 (55 y.o. M) Treating RN: Carlene Coria Primary Care : Vidal Schwalbe Other Clinician: Referring : Vidal Schwalbe Treating /Extender: Skipper Cliche in Treatment: 20 Wound Status Wound Number: 7 Primary Diabetic Wound/Ulcer of the Lower Extremity Etiology: Wound Location: Right, Dorsal Foot Wound Open Wounding Event: Blister Status: Date Acquired: 09/05/2020 Comorbid Arrhythmia, Congestive Heart Failure, Coronary Artery Weeks Of Treatment: 1 History: Disease,  Hypertension, Myocardial Infarction, Peripheral Clustered Wound: No Venous Disease, Type II Diabetes, Neuropathy Photos Wound Measurements Length: (cm) 3 Width: (cm) 0.5 Depth: (cm) 0.1 Area: (cm) 1.178 Volume: (cm) 0.118 % Reduction in Area: 93.2% % Reduction in Volume: 93.2% Epithelialization: None Tunneling: No Undermining: No Wound Description Classification: Grade 2 Exudate Amount: Medium Exudate Type: Serosanguineous Exudate Color: red, brown Foul Odor After Cleansing: No Slough/Fibrino Yes Wound Bed Granulation Amount: Medium (34-66%) Exposed Structure Necrotic Amount: Medium (34-66%) Fascia Exposed: No Necrotic Quality: Eschar, Adherent Slough Fat Layer (Subcutaneous Tissue) Exposed: Yes Tendon Exposed: No Muscle Exposed: No Joint Exposed: No Bone Exposed: No Treatment Notes Wound #7 (Foot) Wound Laterality: Dorsal, Right Cleanser Normal Saline Discharge Instruction: Wash your hands with soap and water. Remove old dressing, discard into plastic bag and place into trash. Cleanse the wound with Normal Saline prior to applying a clean dressing using gauze sponges, not tissues or cotton balls. Do not scrub or use excessive  force. Pat dry using gauze sponges, not tissue or cotton balls. TIMOTH, MACHIDA ([id number]) Peri-Wound Care Topical Primary Dressing Silvercel 4 1/4x 4 1/4 (in/in) Discharge Instruction: Apply Silvercel 4 1/4x 4 1/4 (in/in) as instructed Secondary Dressing ABD Pad 5x9 (in/in) Discharge Instruction: Cover with ABD pad Secured With Conforming Stretch Gauze Bandage 4x75 (in/in) Discharge Instruction: Apply as directed Compression Wrap Compression Stockings Add-Ons Electronic Signature(s) Signed: 09/14/2020 11:27:08 AM By: Carlene Coria RN Entered By: Carlene Coria on 09/14/2020 10:44:47 Ralph Peterson ([id number]) -------------------------------------------------------------------------------- [place] Details Patient Name:  Ralph Peterson Date of Service: 09/14/2020 10:15 AM Medical Record Number: [id number] Patient Account Number: [id number] Date of Birth/Sex: [birth year] (55 y.o. M) Treating RN: Carlene Coria Primary Care : Vidal Schwalbe Other Clinician: Referring : Vidal Schwalbe Treating /Extender: Skipper Cliche in Treatment: 20 Vital Signs Time Taken: 10:27 Temperature (F): 98.2 Height (in): 72 Pulse (bpm): 74 Weight (lbs): 225 Respiratory Rate (breaths/min): 16 Body Mass Index (BMI): 30.5 Blood Pressure (mmHg): 127/85 Reference Range: 80 - 120 mg / dl Electronic Signature(s) Signed: 09/14/2020 11:27:08 AM By: Carlene Coria RN Entered By: Carlene Coria on 09/14/2020 10:29:26

## 2020-09-15 ENCOUNTER — Telehealth (INDEPENDENT_AMBULATORY_CARE_PROVIDER_SITE_OTHER): Payer: Self-pay

## 2020-09-15 NOTE — Telephone Encounter (Signed)
I attempted to contact the patient to schedule his RLE angio with Dr. Delana Meyer and a message was left for a return call.

## 2020-09-17 ENCOUNTER — Encounter (INDEPENDENT_AMBULATORY_CARE_PROVIDER_SITE_OTHER): Payer: Self-pay | Admitting: Nurse Practitioner

## 2020-09-17 NOTE — Progress Notes (Signed)
Subjective:    Patient ID: Ralph Peterson, male    DOB: 06-29-1966, 55 y.o.   MRN: [id number] Chief Complaint  Patient presents with  . Follow-up    [facility] 9 days post le angio    The patient presents today to follow-up after angiogram on 08/15/2020.  The patient underwent:  Procedure(s) Performed: 1. Introduction catheter into right lower extremity 3rd order catheter placement  2. Contrast injection right lower extremity for distal runoff   3. Attempted crosser atherectomy of the right anterior tibial artery 4. Star close closure left common femoral arteriotomy   During the intervention the right anterior tibial artery was not able to be crossed with atherectomy.  The patient currently has a wound on his right foot that has been very slow to heal.  There has been some recent improvements in the patient's wound but not significant at all.  The patient has an upcoming evaluation by [facility] podiatry and believes that more surgery may be upcoming, including possible metatarsal amputation.  The patient is also an end-stage dialysis patient and has been on dialysis for approximately 9 years.  He denies any fever or chills currently.     Review of Systems     Objective:   Physical Exam  BP 111/71 (BP Location: Right Arm)   Pulse 75   Resp 16   Past Medical History:  Diagnosis Date  . Anxiety   . Arrhythmia    atrial fibrillation  . Atrial fibrillation ([place])   . CAD (coronary artery disease)    a. 09/2010 Cath/PCI ([facility]): LM nl, LAD 34m D1 80 (small), LCX 361mOM1 30, RI 70 (small), RCA 70 (DES).  . CHF (congestive heart failure) ([facility]  . Coronary artery disease   . COVID-19 virus infection 07/2019  . Diabetes mellitus without complication ([place]  . ESRD (end stage renal disease) ([facility]  . Hyperlipidemia   . Hypertension   . Ischemic cardiomyopathy    a.  12/2011 Echo ([facility]): NL EF, mod LVH. Mild AS/MS, triv PR/TR. 07/2019  Echo: EF 25-30%, GR1 DD, inf/post HK, low nl RV fxn, mildly dil RA, triv MR, mild Ao sclerosis w/o stenosis.  . NSTEMI (non-ST elevated myocardial infarction) ([facility]  . PAD (peripheral artery disease) ([place]   a. 03/2016 s/p PTA/DEB R SFA/popliteal/peroneal; b. 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 08/2017 Atherectomy/DEB dist L SFA/popliteal. PTA of L AT; d. 06/2018 PTA/DEB L SFA/popliteal/PT/AT; e. 01/2019 Stable ABIs.  . Sleep apnea     Social History   Socioeconomic History  . Marital status: Single    Spouse name: Not on file  . Number of children: Not on file  . Years of education: Not on file  . Highest education level: Not on file  Occupational History  . Not on file  Tobacco Use  . Smoking status: Former Smoker    Types: Cigarettes    Quit date: 2010    Years since quitting: 12.1  . Smokeless tobacco: Never Used  Vaping Use  . Vaping Use: Never used  Substance and Sexual Activity  . Alcohol use: Not Currently  . Drug use: No  . Sexual activity: Not on file  Other Topics Concern  . Not on file  Social History Narrative  . Not on file   Social Determinants of Health   Financial Resource Strain: Not on file  Food Insecurity: Not on file  Transportation Needs: Not on file  Physical Activity: Not  on file  Stress: Not on file  Social Connections: Not on file  Intimate Partner Violence: Not on file    Past Surgical History:  Procedure Laterality Date  . ABDOMINAL AORTOGRAM N/A 09/10/2017   Procedure: ABDOMINAL AORTOGRAM;  Surgeon: Wellington Hampshire, MD;  Location: [place] [facility];  Service: Cardiovascular;  Laterality: N/A;  . AMPUTATION TOE Bilateral    one toe on right, all five toes on the left  . CARDIAC CATHETERIZATION  2012   [facility]; X1 stent 2.5 x 33 mm xience stent Distal TCA  . I & D EXTREMITY Right 08/25/2020   Procedure: IRRIGATION AND DEBRIDEMENT OF RIGHT FOOT;  Surgeon: Felipa Furnace, DPM;  Location: [place];  Service: Podiatry;   Laterality: Right;  . LOWER EXTREMITY ANGIOGRAPHY Bilateral 09/10/2017   Procedure: Lower Extremity Angiography;  Surgeon: Wellington Hampshire, MD;  Location: [place] [facility];  Service: Cardiovascular;  Laterality: Bilateral;  . LOWER EXTREMITY ANGIOGRAPHY Left 06/23/2018   Procedure: LOWER EXTREMITY ANGIOGRAPHY;  Surgeon: Katha Cabal, MD;  Location: [place] [facility];  Service: Cardiovascular;  Laterality: Left;  . LOWER EXTREMITY ANGIOGRAPHY Right 02/01/2020   Procedure: LOWER EXTREMITY ANGIOGRAPHY;  Surgeon: Katha Cabal, MD;  Location: [place] [facility];  Service: Cardiovascular;  Laterality: Right;  . LOWER EXTREMITY ANGIOGRAPHY Right 08/15/2020   Procedure: LOWER EXTREMITY ANGIOGRAPHY;  Surgeon: Katha Cabal, MD;  Location: [place] [facility];  Service: Cardiovascular;  Laterality: Right;  . PERIPHERAL VASCULAR ATHERECTOMY Left 09/10/2017   Procedure: PERIPHERAL VASCULAR ATHERECTOMY;  Surgeon: Wellington Hampshire, MD;  Location: [place] [facility];  Service: Cardiovascular;  Laterality: Left;  SFA/POPLITEAL  . PERIPHERAL VASCULAR BALLOON ANGIOPLASTY Left 09/10/2017   Procedure: PERIPHERAL VASCULAR BALLOON ANGIOPLASTY;  Surgeon: Wellington Hampshire, MD;  Location: [place] [facility];  Service: Cardiovascular;  Laterality: Left;  ANTERIAL TIBIAL  . PERIPHERAL VASCULAR CATHETERIZATION Right 03/26/2016   Procedure: Lower Extremity Angiography;  Surgeon: Katha Cabal, MD;  Location: [place] [facility];  Service: Cardiovascular;  Laterality: Right;  . PERIPHERAL VASCULAR INTERVENTION Left 09/10/2017   Procedure: PERIPHERAL VASCULAR INTERVENTION;  Surgeon: Wellington Hampshire, MD;  Location: [place] [facility];  Service: Cardiovascular;  Laterality: Left;  SFA/POPLITEAL    Family History  Problem Relation Age of Onset  . Hypertension Mother     Allergies  Allergen Reactions  . Oxycodone-Acetaminophen Itching  . Gabapentin Other (See Comments)    unknown  . Tramadol  Other (See Comments)    unknown  . Penicillins Swelling and Rash    Patient endorses itching/hives and tongue swelling with penicillin and amoxicillin at some point in the past.     CBC Latest Ref Rng & Units 08/28/2020 08/27/2020 08/26/2020  WBC 4.0 - 10.5 K/uL 5.7 4.6 6.2  Hemoglobin 13.0 - 17.0 g/dL 11.3(L) 11.2(L) 11.2(L)  Hematocrit 39.0 - 52.0 % 34.9(L) 33.8(L) 33.8(L)  Platelets 150 - 400 K/uL 114(L) 110(L) 121(L)      CMP     Component Value Date/Time   NA 128 (L) 08/28/2020 0345   NA 139 03/19/2016 1423   K 6.1 (H) 08/28/2020 0345   CL 92 (L) 08/28/2020 0345   CO2 22 08/28/2020 0345   GLUCOSE 103 (H) 08/28/2020 0345   BUN 51 (H) 08/28/2020 0345   BUN 33 (H) 03/19/2016 1423   CREATININE 7.91 (H) 08/28/2020 0345   CALCIUM 8.6 (L) 08/28/2020 0345   PROT 6.9 08/24/2020 1500  PROT 6.8 03/19/2016 1423   ALBUMIN 2.7 (L) 08/28/2020 0345   ALBUMIN 4.1 03/19/2016 1423   AST 20 08/24/2020 1500   ALT 13 08/24/2020 1500   ALKPHOS 109 08/24/2020 1500   BILITOT 1.0 08/24/2020 1500   BILITOT 0.4 03/19/2016 1423   GFRNONAA 8 (L) 08/28/2020 0345   GFRAA 10 (L) 03/10/2020 0440     VAS [place] ABI WITH/WO TBI  Result Date: 08/15/2020 LOWER EXTREMITY DOPPLER STUDY Indications: Ulceration, and peripheral artery disease.  Vascular Interventions: 02/01/2020: PTA and Stent placement Right SFA and                         Popliyeal Artery. Mechanical Thrombectomy Right SFA. Comparison Study: 05/04/2020 Performing Technologist: Almira Coaster RVS  Examination Guidelines: A complete evaluation includes at minimum, Doppler waveform signals and systolic blood pressure reading at the level of bilateral brachial, anterior tibial, and posterior tibial arteries, when vessel segments are accessible. Bilateral testing is considered an integral part of a complete examination. Photoelectric Plethysmograph (PPG) waveforms and toe systolic pressure readings are included as required and additional duplex testing  as needed. Limited examinations for reoccurring indications may be performed as noted.  ABI Findings: +-----+------------------+-----+----------+--------+ RightRt Pressure (mmHg)IndexWaveform  Comment  +-----+------------------+-----+----------+--------+ ATA  250                    monophasicNC       +-----+------------------+-----+----------+--------+ PTA  250                    monophasicNC       +-----+------------------+-----+----------+--------+ +----+------------------+-----+----------+-------+ LeftLt Pressure (mmHg)IndexWaveform  Comment +----+------------------+-----+----------+-------+ ATA 243                    monophasicNC      +----+------------------+-----+----------+-------+ PTA 240                    monophasicNC      +----+------------------+-----+----------+-------+ +-------+-----------+-----------+------------+------------+ ABI/TBIToday's ABIToday's TBIPrevious ABIPrevious TBI +-------+-----------+-----------+------------+------------+ Right  >1.0 [place]               >1.0 [place]                  +-------+-----------+-----------+------------+------------+ Left   >1.0 [place]               >1.0 [place]                  +-------+-----------+-----------+------------+------------+ Bilateral ABIs appear essentially unchanged compared to prior study on 05/04/2020.  Summary: Right: Resting right ankle-brachial index indicates noncompressible right lower extremity arteries. Left: Resting left ankle-brachial index indicates noncompressible left lower extremity arteries.  *See table(s) above for measurements and observations.  Electronically signed by Hortencia Pilar MD on 08/15/2020 at 9:44:33 AM.   Final        Assessment & Plan:   1. Atherosclerosis of native arteries of the extremities with ulceration ([place])  Recommend:  Discussed angiogram with the patient with pedal approach.  This is to ensure the patient has adequate ability to heal wounds in the case of any  possible additional surgery.  The patient has evidence of severe atherosclerotic changes of both lower extremities associated with ulceration and tissue loss of the foot.  This represents a limb threatening ischemia and places the patient at the risk for limb loss.  Patient should undergo angiography of the right lower extremities with the hope for intervention for limb salvage.  The risks  and benefits as well as the alternative therapies was discussed in detail with the patient.  All questions were answered.  Patient agrees to proceed with angiography with pedal approach.  The patient will follow up with me in the office after the procedure.     2. ESRD (end stage renal disease) ([place]) Makes wound healing more difficult, currently no issues with dialysis  3. Primary hypertension Continue antihypertensive medications as already ordered, these medications have been reviewed and there are no changes at this time.    Current Outpatient Medications on File Prior to Visit  Medication Sig Dispense Refill  . albuterol (VENTOLIN HFA) 108 (90 Base) MCG/ACT inhaler Inhale 2 puffs into the lungs every 6 (six) hours as needed for wheezing or shortness of breath.    Marland Kitchen apixaban (ELIQUIS) 5 MG TABS tablet Take 1 tablet (5 mg total) by mouth 2 (two) times daily. 60 tablet 3  . atorvastatin (LIPITOR) 40 MG tablet Take 40 mg by mouth at bedtime.  1  . cinacalcet (SENSIPAR) 30 MG tablet Take 30 mg by mouth at bedtime.    . ciprofloxacin (CIPRO) 500 MG tablet Take 1 tablet (500 mg total) by mouth 2 (two) times daily. 20 tablet 0  . escitalopram (LEXAPRO) 10 MG tablet Take 10 mg by mouth daily.    . fluticasone (FLONASE) 50 MCG/ACT nasal spray Place 2 sprays into both nostrils 2 (two) times daily.    Marland Kitchen ibuprofen (ADVIL) 200 MG tablet Take 400 mg by mouth every 6 (six) hours as needed for mild pain.    Marland Kitchen insulin detemir (LEVEMIR) 100 UNIT/ML injection Inject 0.05 mLs (5 Units total) into the skin daily.    Marland Kitchen  loperamide (IMODIUM) 2 MG capsule Take 2 mg by mouth daily as needed for diarrhea or loose stools.    . metoprolol succinate (TOPROL-XL) 50 MG 24 hr tablet Take 50 mg by mouth 2 (two) times daily.    . mirtazapine (REMERON) 7.5 MG tablet Take 7.5 mg by mouth at bedtime.    . montelukast (SINGULAIR) 10 MG tablet Take 10 mg by mouth at bedtime.    Marland Kitchen omeprazole (PRILOSEC) 20 MG capsule Take 20 mg by mouth at bedtime.    . sevelamer carbonate (RENVELA) 800 MG tablet Take 1,600-3,200 mg by mouth See admin instructions. Take 4 tablets ('3200mg'$ ) three times daily with meals and take 2 tablet (1600 mg) by mouth with snacks     No current facility-administered medications on file prior to visit.    There are no Patient Instructions on file for this visit. No follow-ups on file.   Kris Hartmann, NP

## 2020-09-18 LAB — AEROBIC CULTURE W GRAM STAIN (SUPERFICIAL SPECIMEN)

## 2020-09-20 ENCOUNTER — Telehealth (INDEPENDENT_AMBULATORY_CARE_PROVIDER_SITE_OTHER): Payer: Self-pay

## 2020-09-20 NOTE — Telephone Encounter (Signed)
Contacted the patient and he is scheduled with Dr. Delana Meyer for a RLE angio on 10/10/20 with a 11:00 am arrival time to the [facility]. Covid testing on 10/06/20 between 8-1 pm at the [facility]. Pre-procedure instructions were discussed and will be mailed. Patient was offered 10/04/20.

## 2020-09-21 ENCOUNTER — Encounter: Payer: Medicare PPO | Attending: Physician Assistant | Admitting: Physician Assistant

## 2020-09-21 ENCOUNTER — Other Ambulatory Visit: Payer: Self-pay

## 2020-09-21 DIAGNOSIS — E11621 Type 2 diabetes mellitus with foot ulcer: Secondary | ICD-10-CM | POA: Diagnosis not present

## 2020-09-21 DIAGNOSIS — T8781 Dehiscence of amputation stump: Secondary | ICD-10-CM | POA: Diagnosis present

## 2020-09-21 DIAGNOSIS — I5042 Chronic combined systolic (congestive) and diastolic (congestive) heart failure: Secondary | ICD-10-CM | POA: Diagnosis not present

## 2020-09-21 DIAGNOSIS — L97512 Non-pressure chronic ulcer of other part of right foot with fat layer exposed: Secondary | ICD-10-CM | POA: Diagnosis not present

## 2020-09-21 DIAGNOSIS — L89893 Pressure ulcer of other site, stage 3: Secondary | ICD-10-CM | POA: Diagnosis not present

## 2020-09-21 DIAGNOSIS — L97812 Non-pressure chronic ulcer of other part of right lower leg with fat layer exposed: Secondary | ICD-10-CM | POA: Diagnosis not present

## 2020-09-21 DIAGNOSIS — E1122 Type 2 diabetes mellitus with diabetic chronic kidney disease: Secondary | ICD-10-CM | POA: Diagnosis not present

## 2020-09-21 DIAGNOSIS — Z992 Dependence on renal dialysis: Secondary | ICD-10-CM | POA: Insufficient documentation

## 2020-09-21 DIAGNOSIS — N186 End stage renal disease: Secondary | ICD-10-CM | POA: Insufficient documentation

## 2020-09-21 DIAGNOSIS — Y835 Amputation of limb(s) as the cause of abnormal reaction of the patient, or of later complication, without mention of misadventure at the time of the procedure: Secondary | ICD-10-CM | POA: Insufficient documentation

## 2020-09-21 NOTE — Progress Notes (Addendum)
SIMCHA, HUDELSON ([id number]) Visit Report for 09/21/2020 Arrival Information Details Patient Name: Ralph Peterson, Ralph Peterson. Date of Service: 09/21/2020 11:00 AM Medical Record Number: [id number] Patient Account Number: [id number] Date of Birth/Sex: [birth year] (55 y.o. M) Treating RN: Dolan Amen Primary Care Alicea Wente: Vidal Schwalbe Other Clinician: Jeanine Luz Referring Shritha Bresee: Vidal Schwalbe Treating Amzie Sillas/Extender: Skipper Cliche in Treatment: 21 Visit Information History Since Last Visit Added or deleted any medications: No Patient Arrived: Wheel Chair Had a fall or experienced change in No Arrival Time: 11:07 activities of daily living that may affect Accompanied By: son risk of falls: Transfer Assistance: None Hospitalized since last visit: No Patient Identification Verified: Yes Pain Present Now: No Secondary Verification Process Completed: Yes Patient Requires Transmission-Based Precautions: No Patient Has Alerts: No Electronic Signature(s) Signed: 09/21/2020 1:24:27 PM By: Jeanine Luz Entered By: Jeanine Luz on 09/21/2020 11:08:00 Ralph Peterson ([id number]) -------------------------------------------------------------------------------- Clinic Level of Care Assessment Details Patient Name: Ralph Peterson Date of Service: 09/21/2020 11:00 AM Medical Record Number: [id number] Patient Account Number: [id number] Date of Birth/Sex: 04-02-1966 (55 y.o. M) Treating RN: Dolan Amen Primary Care Symiah Nowotny: Vidal Schwalbe Other Clinician: Jeanine Luz Referring Kaydence Menard: Vidal Schwalbe Treating Kiriana Worthington/Extender: Skipper Cliche in Treatment: 21 Clinic Level of Care Assessment Items TOOL 1 Quantity Score '[]'$  - Use when EandM and Procedure is performed on INITIAL visit 0 ASSESSMENTS - Nursing Assessment / Reassessment '[]'$  - General Physical Exam (combine w/ comprehensive assessment (listed just below) when performed on new 0 pt.  evals) '[]'$  - 0 Comprehensive Assessment (HX, ROS, Risk Assessments, Wounds Hx, etc.) ASSESSMENTS - Wound and Skin Assessment / Reassessment '[]'$  - Dermatologic / Skin Assessment (not related to wound area) 0 ASSESSMENTS - Ostomy and/or Continence Assessment and Care '[]'$  - Incontinence Assessment and Management 0 '[]'$  - 0 Ostomy Care Assessment and Management (repouching, etc.) PROCESS - Coordination of Care '[]'$  - Simple Patient / Family Education for ongoing care 0 '[]'$  - 0 Complex (extensive) Patient / Family Education for ongoing care '[]'$  - 0 Staff obtains Programmer, systems, Records, Test Results / Process Orders '[]'$  - 0 Staff telephones HHA, Nursing Homes / Clarify orders / etc '[]'$  - 0 Routine Transfer to another Facility (non-emergent condition) '[]'$  - 0 Routine Hospital Admission (non-emergent condition) '[]'$  - 0 New Admissions / Biomedical engineer / Ordering NPWT, Apligraf, etc. '[]'$  - 0 Emergency Hospital Admission (emergent condition) PROCESS - Special Needs '[]'$  - Pediatric / Minor Patient Management 0 '[]'$  - 0 Isolation Patient Management '[]'$  - 0 Hearing / Language / Visual special needs '[]'$  - 0 Assessment of Community assistance (transportation, D/C planning, etc.) '[]'$  - 0 Additional assistance / Altered mentation '[]'$  - 0 Support Surface(s) Assessment (bed, cushion, seat, etc.) INTERVENTIONS - Miscellaneous '[]'$  - External ear exam 0 '[]'$  - 0 Patient Transfer (multiple staff / Civil Service fast streamer / Similar devices) '[]'$  - 0 Simple Staple / Suture removal (25 or less) '[]'$  - 0 Complex Staple / Suture removal (26 or more) '[]'$  - 0 Hypo/Hyperglycemic Management (do not check if billed separately) '[]'$  - 0 Ankle / Brachial Index (ABI) - do not check if billed separately Has the patient been seen at the hospital within the last three years: Yes Total Score: 0 Level Of Care: ____ Ralph Peterson ([id number]) Electronic Signature(s) Signed: 09/21/2020 2:54:27 PM By: Georges Mouse, Minus Breeding RN Entered By:  Georges Mouse, Minus Breeding on 09/21/2020 12:01:56 Ralph Peterson ([id number]) -------------------------------------------------------------------------------- Encounter Discharge Information Details Patient Name: Ralph Peterson Date of Service: 09/21/2020 11:00 AM  Medical Record Number: [id number] Patient Account Number: [id number] Date of Birth/Sex: May 22, 1966 (55 y.o. M) Treating RN: Dolan Amen Primary Care Yarithza Mink: Vidal Schwalbe Other Clinician: Jeanine Luz Referring Somnang Mahan: Vidal Schwalbe Treating Daegen Berrocal/Extender: Skipper Cliche in Treatment: 21 Encounter Discharge Information Items Post Procedure Vitals Discharge Condition: Stable Temperature (F): 98.0 Ambulatory Status: Wheelchair Pulse (bpm): 74 Discharge Destination: Home Respiratory Rate (breaths/min): 18 Transportation: Private Auto Blood Pressure (mmHg): 117/79 Accompanied By: son Schedule Follow-up Appointment: Yes Clinical Summary of Care: Electronic Signature(s) Signed: 09/21/2020 2:54:27 PM By: Georges Mouse, Minus Breeding RN Entered By: Georges Mouse, Minus Breeding on 09/21/2020 12:03:07 Ralph Peterson ([id number]) -------------------------------------------------------------------------------- Lower Extremity Assessment Details Patient Name: Ralph Peterson. Date of Service: 09/21/2020 11:00 AM Medical Record Number: [id number] Patient Account Number: [id number] Date of Birth/Sex: Sep 03, 1965 (55 y.o. M) Treating RN: Dolan Amen Primary Care Erminia Mcnew: Vidal Schwalbe Other Clinician: Jeanine Luz Referring Burley Kopka: Vidal Schwalbe Treating Zia Najera/Extender: Skipper Cliche in Treatment: 21 Edema Assessment Assessed: [Left: No] [Right: No] [Left: Edema] [Right: :] Calf Left: Right: Point of Measurement: 40 cm From Medial Instep 30.8 cm Ankle Left: Right: Point of Measurement: 10 cm From Medial Instep 22.5 cm Electronic Signature(s) Signed: 09/21/2020 1:24:27 PM By: Jeanine Luz Signed: 09/21/2020 2:54:27 PM By: Georges Mouse, Minus Breeding RN Entered By: Jeanine Luz on 09/21/2020 11:25:49 Ralph Peterson ([id number]) -------------------------------------------------------------------------------- Multi Wound Chart Details Patient Name: Ralph Peterson. Date of Service: 09/21/2020 11:00 AM Medical Record Number: [id number] Patient Account Number: [id number] Date of Birth/Sex: [birth year] (55 y.o. M) Treating RN: Dolan Amen Primary Care Gissele Narducci: Vidal Schwalbe Other Clinician: Jeanine Luz Referring Johntae Broxterman: Vidal Schwalbe Treating Shanell Aden/Extender: Skipper Cliche in Treatment: 21 Vital Signs Height(in): 26 Pulse(bpm): 102 Weight(lbs): 225 Blood Pressure(mmHg): 117/79 Body Mass Index(BMI): 31 Temperature(F): 98.0 Respiratory Rate(breaths/min): 16 Photos: Wound Location: Right, Lateral Foot Right, Distal, Dorsal Foot Right, Dorsal Foot Wounding Event: Pressure Injury Surgical Injury Blister Primary Etiology: Pressure Ulcer Open Surgical Wound Diabetic Wound/Ulcer of the Lower Extremity Comorbid History: Arrhythmia, Congestive Heart Arrhythmia, Congestive Heart Arrhythmia, Congestive Heart Failure, Coronary Artery Disease, Failure, Coronary Artery Disease, Failure, Coronary Artery Disease, Hypertension, Myocardial Infarction, Hypertension, Myocardial Infarction, Hypertension, Myocardial Infarction, Peripheral Venous Disease, Type II Peripheral Venous Disease, Type II Peripheral Venous Disease, Type II Diabetes, Neuropathy Diabetes, Neuropathy Diabetes, Neuropathy Date Acquired: 02/20/2020 04/20/2020 09/05/2020 Weeks of Treatment: '21 21 2 '$ Wound Status: Open Open Open Measurements L x W x D (cm) 1.6x0.5x0.2 0.1x0.1x0.2 3x0.3x0.1 Area (cm) : 0.628 0.008 0.707 Volume (cm) : 0.126 0.002 0.071 % Reduction in Area: 87.60% 99.80% 95.90% % Reduction in Volume: 95.00% 99.90% 95.90% Classification: Category/Stage IV Full Thickness Without  Exposed Grade 2 Support Structures Exudate Amount: Medium Medium None Present Exudate Type: Serosanguineous Purulent N/A Exudate Color: red, brown yellow, brown, green N/A Wound Margin: Distinct, outline attached Distinct, outline attached N/A Granulation Amount: Medium (34-66%) Medium (34-66%) Medium (34-66%) Granulation Quality: Pink, Pale Pink Pink Necrotic Amount: Small (1-33%) Medium (34-66%) Medium (34-66%) Necrotic Tissue: Adherent [address] Eschar Exposed Structures: Fat Layer (Subcutaneous Tissue): Fat Layer (Subcutaneous Tissue): Fat Layer (Subcutaneous Tissue): Yes Yes Yes Fascia: No Fascia: No Fascia: No Tendon: No Tendon: No Tendon: No Muscle: No Muscle: No Muscle: No Joint: No Joint: No Joint: No Bone: No Bone: No Bone: No Epithelialization: None Large (67-100%) None Treatment Notes Electronic Signature(s) Signed: 09/21/2020 2:54:27 PM By: Georges Mouse, Minus Breeding RN Ralph Peterson, Ralph Peterson ([id number]) Entered By: Georges Mouse, Minus Breeding on 09/21/2020 11:54:15 Ralph Peterson ([id number]) -------------------------------------------------------------------------------- Multi-Disciplinary Care Plan Details Patient  Name: Ralph Peterson, Ralph Peterson. Date of Service: 09/21/2020 11:00 AM Medical Record Number: [id number] Patient Account Number: [id number] Date of Birth/Sex: [birth year] (55 y.o. M) Treating RN: Dolan Amen Primary Care Bradyn Soward: Vidal Schwalbe Other Clinician: Jeanine Luz Referring Livy Ross: Vidal Schwalbe Treating Ilia Engelbert/Extender: Skipper Cliche in Treatment: 21 Active Inactive Necrotic Tissue Nursing Diagnoses: Impaired tissue integrity related to necrotic/devitalized tissue Knowledge deficit related to management of necrotic/devitalized tissue Goals: Necrotic/devitalized tissue will be minimized in the wound bed Date Initiated: 05/09/2020 Target Resolution Date: 05/09/2020 Goal Status: Active Interventions: Assess  patient pain level pre-, during and post procedure and prior to discharge Provide education on necrotic tissue and debridement process Treatment Activities: Enzymatic debridement : 05/09/2020 Notes: Wound/Skin Impairment Nursing Diagnoses: Impaired tissue integrity Knowledge deficit related to ulceration/compromised skin integrity Goals: Patient/caregiver will verbalize understanding of skin care regimen Date Initiated: 04/27/2020 Target Resolution Date: 05/25/2020 Goal Status: Active Interventions: Assess patient/caregiver ability to obtain necessary supplies Assess patient/caregiver ability to perform ulcer/skin care regimen upon admission and as needed Assess ulceration(s) every visit Provide education on ulcer and skin care Treatment Activities: Skin care regimen initiated : 04/27/2020 Topical wound management initiated : 04/27/2020 Notes: Electronic Signature(s) Signed: 09/21/2020 2:54:27 PM By: Georges Mouse, Minus Breeding RN Entered By: Georges Mouse, Minus Breeding on 09/21/2020 11:53:26 Ralph Peterson ([id number]) -------------------------------------------------------------------------------- Pain Assessment Details Patient Name: Ralph Peterson Date of Service: 09/21/2020 11:00 AM Medical Record Number: [id number] Patient Account Number: [id number] Date of Birth/Sex: 1965-10-25 (55 y.o. M) Treating RN: Dolan Amen Primary Care Charlene Cowdrey: Vidal Schwalbe Other Clinician: Jeanine Luz Referring Rayvon Brandvold: Vidal Schwalbe Treating Romayne Ticas/Extender: Skipper Cliche in Treatment: 21 Active Problems Location of Pain Severity and Description of Pain Patient Has Paino No Site Locations Pain Management and Medication Current Pain Management: Electronic Signature(s) Signed: 09/21/2020 1:24:27 PM By: Jeanine Luz Signed: 09/21/2020 2:54:27 PM By: Georges Mouse, Minus Breeding RN Entered By: Jeanine Luz on 09/21/2020 11:10:06 Ralph Peterson  ([id number]) -------------------------------------------------------------------------------- Patient/Caregiver Education Details Patient Name: Ralph Peterson Date of Service: 09/21/2020 11:00 AM Medical Record Number: [id number] Patient Account Number: [id number] Date of Birth/Gender: 1965/08/05 (55 y.o. M) Treating RN: Dolan Amen Primary Care Physician: Vidal Schwalbe Other Clinician: Jeanine Luz Referring Physician: Vidal Schwalbe Treating Physician/Extender: Skipper Cliche in Treatment: 21 Education Assessment Education Provided To: Patient Education Topics Provided Wound/Skin Impairment: Methods: Explain/Verbal Responses: State content correctly Electronic Signature(s) Signed: 09/21/2020 2:54:27 PM By: Georges Mouse, Minus Breeding RN Entered By: Georges Mouse, Minus Breeding on 09/21/2020 12:02:18 Ralph Peterson ([id number]) -------------------------------------------------------------------------------- Wound Assessment Details Patient Name: Ralph Peterson Date of Service: 09/21/2020 11:00 AM Medical Record Number: [id number] Patient Account Number: [id number] Date of Birth/Sex: [birth year] (55 y.o. M) Treating RN: Dolan Amen Primary Care Rasha Ibe: Vidal Schwalbe Other Clinician: Jeanine Luz Referring Marisa Hage: Vidal Schwalbe Treating Jashad Depaula/Extender: Skipper Cliche in Treatment: 21 Wound Status Wound Number: 1 Primary Pressure Ulcer Etiology: Wound Location: Right, Lateral Foot Wound Open Wounding Event: Pressure Injury Status: Date Acquired: 02/20/2020 Comorbid Arrhythmia, Congestive Heart Failure, Coronary Artery Weeks Of Treatment: 21 History: Disease, Hypertension, Myocardial Infarction, Peripheral Clustered Wound: No Venous Disease, Type II Diabetes, Neuropathy Photos Wound Measurements Length: (cm) 1.6 Width: (cm) 0.5 Depth: (cm) 0.2 Area: (cm) 0.628 Volume: (cm) 0.126 % Reduction in Area: 87.6% % Reduction in Volume:  95% Epithelialization: None Tunneling: No Undermining: No Wound Description Classification: Category/Stage IV Wound Margin: Distinct, outline attached Exudate Amount: Medium Exudate Type: Serosanguineous Exudate Color: red, brown Foul Odor After Cleansing: No Slough/Fibrino Yes Wound Bed Granulation Amount: Medium (34-66%)  Exposed Structure Granulation Quality: Pink, Pale Fascia Exposed: No Necrotic Amount: Small (1-33%) Fat Layer (Subcutaneous Tissue) Exposed: Yes Necrotic Quality: Adherent Slough Tendon Exposed: No Muscle Exposed: No Joint Exposed: No Bone Exposed: No Treatment Notes Wound #1 (Foot) Wound Laterality: Right, Lateral Cleanser Normal Saline Discharge Instruction: Wash your hands with soap and water. Remove old dressing, discard into plastic bag and place into trash. Cleanse the wound with Normal Saline prior to applying a clean dressing using gauze sponges, not tissues or cotton balls. Do not Schedler, Ralph S. ([id number]) scrub or use excessive force. Pat dry using gauze sponges, not tissue or cotton balls. Peri-Wound Care Topical Primary Dressing Silvercel Small 2x2 (in/in) Discharge Instruction: Apply Silvercel Small 2x2 (in/in) as instructed Secondary Dressing ABD Pad 5x9 (in/in) Discharge Instruction: Cover with ABD pad Secured With Conforming Stretch Gauze Bandage 4x75 (in/in) Discharge Instruction: Apply as directed Compression Wrap Compression Stockings Add-Ons Electronic Signature(s) Signed: 09/21/2020 1:24:27 PM By: Jeanine Luz Signed: 09/21/2020 2:54:27 PM By: Georges Mouse, Minus Breeding RN Entered By: Jeanine Luz on 09/21/2020 11:24:01 Ralph Peterson ([id number]) -------------------------------------------------------------------------------- Wound Assessment Details Patient Name: Ralph Peterson. Date of Service: 09/21/2020 11:00 AM Medical Record Number: [id number] Patient Account Number: [id number] Date of Birth/Sex:  1965-12-20 (55 y.o. M) Treating RN: Dolan Amen Primary Care Meoshia Billing: Vidal Schwalbe Other Clinician: Jeanine Luz Referring Callahan Wild: Vidal Schwalbe Treating Venkat Ankney/Extender: Skipper Cliche in Treatment: 21 Wound Status Wound Number: 2 Primary Open Surgical Wound Etiology: Wound Location: Right, Distal, Dorsal Foot Wound Open Wounding Event: Surgical Injury Status: Date Acquired: 04/20/2020 Comorbid Arrhythmia, Congestive Heart Failure, Coronary Artery Weeks Of Treatment: 21 History: Disease, Hypertension, Myocardial Infarction, Peripheral Clustered Wound: No Venous Disease, Type II Diabetes, Neuropathy Photos Wound Measurements Length: (cm) 0.1 Width: (cm) 0.1 Depth: (cm) 0.2 Area: (cm) 0.008 Volume: (cm) 0.002 % Reduction in Area: 99.8% % Reduction in Volume: 99.9% Epithelialization: Large (67-100%) Tunneling: No Undermining: No Wound Description Classification: Full Thickness Without Exposed Support Structures Wound Margin: Distinct, outline attached Exudate Amount: Medium Exudate Type: Purulent Exudate Color: yellow, brown, green Foul Odor After Cleansing: No Slough/Fibrino Yes Wound Bed Granulation Amount: Medium (34-66%) Exposed Structure Granulation Quality: Pink Fascia Exposed: No Necrotic Amount: Medium (34-66%) Fat Layer (Subcutaneous Tissue) Exposed: Yes Necrotic Quality: Adherent Slough Tendon Exposed: No Muscle Exposed: No Joint Exposed: No Bone Exposed: No Treatment Notes Wound #2 (Foot) Wound Laterality: Dorsal, Right, Distal Cleanser Normal Saline Discharge Instruction: Wash your hands with soap and water. Remove old dressing, discard into plastic bag and place into trash. Cleanse the wound with Normal Saline prior to applying a clean dressing using gauze sponges, not tissues or cotton balls. Do not Ralph Peterson, Ralph S. ([id number]) scrub or use excessive force. Pat dry using gauze sponges, not tissue or cotton balls. Peri-Wound  Care Topical Primary Dressing Silvercel 4 1/4x 4 1/4 (in/in) Discharge Instruction: Apply Silvercel 4 1/4x 4 1/4 (in/in) as instructed Secondary Dressing ABD Pad 5x9 (in/in) Discharge Instruction: Cover with ABD pad Secured With Conforming Stretch Gauze Bandage 4x75 (in/in) Discharge Instruction: Apply as directed Compression Wrap Compression Stockings Add-Ons Electronic Signature(s) Signed: 09/21/2020 1:24:27 PM By: Jeanine Luz Signed: 09/21/2020 2:54:27 PM By: Georges Mouse, Minus Breeding RN Entered By: Jeanine Luz on 09/21/2020 11:24:37 Ralph Peterson ([id number]) -------------------------------------------------------------------------------- Wound Assessment Details Patient Name: Ralph Peterson. Date of Service: 09/21/2020 11:00 AM Medical Record Number: [id number] Patient Account Number: [id number] Date of Birth/Sex: [birth year] (55 y.o. M) Treating RN: Dolan Amen Primary Care Anaiah Mcmannis: Vidal Schwalbe Other Clinician: Jeanine Luz Referring  Cherre Kothari: Vidal Schwalbe Treating Quina Wilbourne/Extender: Skipper Cliche in Treatment: 21 Wound Status Wound Number: 7 Primary Diabetic Wound/Ulcer of the Lower Extremity Etiology: Wound Location: Right, Dorsal Foot Wound Open Wounding Event: Blister Status: Date Acquired: 09/05/2020 Comorbid Arrhythmia, Congestive Heart Failure, Coronary Artery Weeks Of Treatment: 2 History: Disease, Hypertension, Myocardial Infarction, Peripheral Clustered Wound: No Venous Disease, Type II Diabetes, Neuropathy Photos Wound Measurements Length: (cm) 3 Width: (cm) 0.3 Depth: (cm) 0.1 Area: (cm) 0.707 Volume: (cm) 0.071 % Reduction in Area: 95.9% % Reduction in Volume: 95.9% Epithelialization: None Tunneling: No Undermining: No Wound Description Classification: Grade 2 Exudate Amount: None Present Foul Odor After Cleansing: No Slough/Fibrino No Wound Bed Granulation Amount: Medium (34-66%) Exposed  Structure Granulation Quality: Pink Fascia Exposed: No Necrotic Amount: Medium (34-66%) Fat Layer (Subcutaneous Tissue) Exposed: Yes Necrotic Quality: Eschar Tendon Exposed: No Muscle Exposed: No Joint Exposed: No Bone Exposed: No Treatment Notes Wound #7 (Foot) Wound Laterality: Dorsal, Right Cleanser Normal Saline Discharge Instruction: Wash your hands with soap and water. Remove old dressing, discard into plastic bag and place into trash. Cleanse the wound with Normal Saline prior to applying a clean dressing using gauze sponges, not tissues or cotton balls. Do not scrub or use excessive force. Pat dry using gauze sponges, not tissue or cotton balls. Peri-Wound Care Ralph Peterson, Ralph Peterson ([id number]) Topical Primary Dressing Silvercel 4 1/4x 4 1/4 (in/in) Discharge Instruction: Apply Silvercel 4 1/4x 4 1/4 (in/in) as instructed Secondary Dressing ABD Pad 5x9 (in/in) Discharge Instruction: Cover with ABD pad Secured With Conforming Stretch Gauze Bandage 4x75 (in/in) Discharge Instruction: Apply as directed Compression Wrap Compression Stockings Add-Ons Electronic Signature(s) Signed: 09/21/2020 1:24:27 PM By: Jeanine Luz Signed: 09/21/2020 2:54:27 PM By: Georges Mouse, Minus Breeding RN Entered By: Jeanine Luz on 09/21/2020 11:25:20 Ralph Peterson ([id number]) -------------------------------------------------------------------------------- Vitals Details Patient Name: Ralph Peterson Date of Service: 09/21/2020 11:00 AM Medical Record Number: [id number] Patient Account Number: [id number] Date of Birth/Sex: 22-Feb-1966 (55 y.o. M) Treating RN: Dolan Amen Primary Care Demetrio Leighty: Vidal Schwalbe Other Clinician: Jeanine Luz Referring Jessikah Dicker: Vidal Schwalbe Treating Mallika Sanmiguel/Extender: Skipper Cliche in Treatment: 21 Vital Signs Time Taken: 11:08 Temperature (F): 98.0 Height (in): 72 Pulse (bpm): 74 Weight (lbs): 225 Respiratory Rate  (breaths/min): 16 Body Mass Index (BMI): 30.5 Blood Pressure (mmHg): 117/79 Reference Range: 80 - 120 mg / dl Electronic Signature(s) Signed: 09/21/2020 1:24:27 PM By: Jeanine Luz Entered By: Jeanine Luz on 09/21/2020 11:10:00

## 2020-09-21 NOTE — Progress Notes (Addendum)
THADDEUS, LINN ([id number]) Visit Report for 09/21/2020 Chief Complaint Document Details Patient Name: Ralph Peterson, Ralph Peterson. Date of Service: 09/21/2020 11:00 AM Medical Record Number: [id number] Patient Account Number: [id number] Date of Birth/Sex: 17-Jul-1966 (55 y.o. 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 M) Treating RN: Dolan Amen Primary Care Provider: Vidal Schwalbe Other Clinician: Jeanine Luz Referring Provider: Vidal Schwalbe Treating Provider/Extender: Skipper Cliche in Treatment: 21 Debridement Performed for Wound #7 Right,Dorsal Foot Assessment: Performed By: Physician Tommie Sams., PA-C Debridement Type: Debridement Severity of Tissue Pre Debridement: Fat layer exposed Level of Consciousness (Pre- Awake and Alert procedure): Pre-procedure Verification/Time Out Yes - 11:58 Taken: Start Time: 11:58 Total Area Debrided (L x W): 3 (cm) x 0.3 (cm) = 0.9 (cm) Tissue and other material Eschar, Slough, Subcutaneous, Slough debrided: Level: Skin/Subcutaneous Tissue Debridement Description: Excisional Instrument: Curette Bleeding: Minimum Hemostasis Achieved: Pressure Response to Treatment: Procedure was tolerated  well Level of Consciousness (Post- Awake and Alert procedure): Post Debridement Measurements of Total Wound Length: (cm) 3 Width: (cm) 0.3 Depth: (cm) 0.2 Volume: (cm) 0.141 Character of Wound/Ulcer Post Debridement: Stable Severity of Tissue Post Debridement: Fat layer exposed Post Procedure Diagnosis Same as Pre-procedure Electronic Signature(s) Signed: 09/21/2020 2:54:27 PM By: Georges Mouse, Minus Breeding RN Signed: 09/22/2020 11:30:15 AM By: Worthy Keeler PA-C Entered By: Georges Mouse, Minus Breeding on 09/21/2020 11:59:25 Ralph Peterson ([id number]) -------------------------------------------------------------------------------- Debridement Details Patient Name: Ralph Peterson. Date of Service: 09/21/2020 11:00 AM Medical Record Number: [id number] Patient Account Number: [id number] Date of Birth/Sex: 08-Apr-1966 (55 y.o. M) Treating RN: Dolan Amen Primary Care Provider: Vidal Schwalbe Other Clinician: Jeanine Luz Referring Provider: Vidal Schwalbe Treating Provider/Extender: Skipper Cliche in Treatment: 21 Debridement Performed for Wound #1 Right,Lateral Foot Assessment: Performed By: Physician Tommie Sams., PA-C Debridement Type: Debridement Level of Consciousness (Pre- Awake and Alert procedure): Pre-procedure Verification/Time Out Yes - 11:58 Taken: Start Time: 11:58 Total Area Debrided (L x W): 1.6 (cm) x 0.5 (cm) = 0.8 (cm) Tissue and other material Non-Viable, Callus, Slough, Subcutaneous, Slough debrided: Level: Skin/Subcutaneous Tissue Debridement Description: Excisional Instrument: Curette Bleeding: Minimum Hemostasis Achieved: Pressure Response to Treatment: Procedure was tolerated well Level of Consciousness (Post- Awake and Alert procedure): Post Debridement Measurements of Total Wound Length: (cm) 1.6 Stage: Category/Stage IV Width: (cm) 0.5 Depth: (cm) 0.3 Volume: (cm) 0.188 Character of Wound/Ulcer Post Debridement:  Stable Post Procedure Diagnosis Same as Pre-procedure Electronic Signature(s) Signed: 09/21/2020 2:54:27 PM By: Georges Mouse, Minus Breeding RN Signed: 09/22/2020 11:30:15 AM By: Worthy Keeler PA-C Entered By: Georges Mouse, Minus Breeding on 09/21/2020 12:00:03 Ralph Peterson ([id number]) -------------------------------------------------------------------------------- HPI Details Patient Name: Ralph Peterson. Date of Service: 09/21/2020 11:00 AM Medical Record Number: [id number] Patient Account Number: [id number] Date of Birth/Sex: [birth year] (55 y.o. M) Treating RN: Dolan Amen Primary Care Provider: Vidal Schwalbe Other Clinician: Jeanine Luz Referring Provider: Vidal Schwalbe Treating Provider/Extender: Skipper Cliche in Treatment: 21 History of Present Illness HPI Description: 08/17/2020 upon evaluation today patient actually appears  to be doing decently well today which is good news in regard to the dorsal wound which is almost healed and the lateral wound also doing much better. With regard to the plantar wound this is still draining purulent drainage unfortunately the culture did reveal that he had Pseudomonas as well as Enterococcus. With that being said he is allergic to penicillin which took away the only oral medication for the Enterococcus I did look at linezolid as a possibility but unfortunately there were medication interactions. Also the same was true the Cipro where there were medication interactions. Nonetheless I want to see if we can get him into infectious disease to see if they can work with him to try to find possibly add at home IV antibiotic regimen that can help out at this point. No sharp debridement is can be necessary today. 09/07/2020 upon evaluation today patient appears to be doing unfortunately not too well in regard to his wound. He has been tolerating the dressing changes without complication. Fortunately there is no sign of active infection at this  time. No fevers, chills, nausea, vomiting, or diarrhea. The patient did have a repeat surgery going to clean out the abscess and try to work on getting things moving in a better direction about a week and a half ago. He still has sutures in place. With that being said unfortunately he does not appear to be showing signs of a lot of improvement in my opinion. He does have sutures on the plantar aspect still in place and on the dorsal aspect he has a small opening which actually probes all the way down through and actually came out of the plantar aspect. I feel like that he really is having a lot of issues here and I am afraid that if we do not get this taken care of this is can end up with him having a much larger and more extensive amputation than what he was hoping to have to deal with with this. It has already been since June 2021 but has been dealing with this including all the time since the amputation. 09/14/2020 upon evaluation today patient appears to be doing really about the same in regard to the wound on the dorsal surface of his foot. The plantar foot unfortunately still has sutures in place I really cannot see how things are doing in that regard. With the dorsal foot however this appears to show signs of still having a small opening in the distal aspect which does actually probe down to bone there is also significant purulent drainage we did actually obtain a wound culture today to further evaluate the issue here. 09/21/2020 upon evaluation today patient appears to be doing about the same in regard to his wound on the dorsal surface of his foot and the plantar foot he still does not have the sutures out at this point. That should hopefully happen in the next week. With that being said the main issue is he continues to have drainage here. It was noted that he had positive findings on culture for Enterobacter. With that being said he cannot take oral penicillin he could do vancomycin and I think  that is good to be the ideal thing this to see if we can get him started on IV vancomycin at dialysis. We will get a get in touch with them to try to see if we can coordinate that being done there at the dialysis center. I think that is the ideal situation at this point  I would probably start with just 2 weeks of therapy. Electronic Signature(s) Signed: 09/22/2020 8:05:49 AM By: Worthy Keeler PA-C Entered By: Worthy Keeler on 09/22/2020 08:05:49 Ralph Peterson ([id number]) -------------------------------------------------------------------------------- Physical Exam Details Patient Name: Ralph Peterson Date of Service: 09/21/2020 11:00 AM Medical Record Number: [id number] Patient Account Number: [id number] Date of Birth/Sex: Jul 27, 1965 (55 y.o. M) Treating RN: Dolan Amen Primary Care Provider: Vidal Schwalbe Other Clinician: Jeanine Luz Referring Provider: Vidal Schwalbe Treating Provider/Extender: Skipper Cliche in Treatment: 21 Constitutional Well-nourished and well-hydrated in no acute distress. Respiratory normal breathing without difficulty. Psychiatric this patient is able to make decisions and demonstrates good insight into disease process. Alert and Oriented x 3. pleasant and cooperative. Notes Upon inspection patient's wound bed actually showed signs of fairly good granulation on the lateral part of his foot as well as the dorsal part of his foot more proximal which actually did require little bit of debridement but nonetheless seems to be making progress. In regard to the distal dorsal foot where he has basically a opening that extends down into the foot and even exits plantar where the sutures are still in place that this is not doing quite as well unfortunately. I am still concerned about what may be going on in this region. The patient does have a second opinion coming up shortly this month with [facility]. Electronic Signature(s) Signed: 09/22/2020 8:06:33  AM By: Worthy Keeler PA-C Entered By: Worthy Keeler on 09/22/2020 08:06:32 Ralph Peterson ([id number]) -------------------------------------------------------------------------------- Physician Orders Details Patient Name: Ralph Peterson Date of Service: 09/21/2020 11:00 AM Medical Record Number: [id number] Patient Account Number: [id number] Date of Birth/Sex: 01/25/1966 (55 y.o. 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 Wound #2 Right,Distal,Dorsal Foot o Return Appointment in 1 week. Wound #7 Right,Dorsal Foot o Return Appointment in 1 week. Bathing/ Shower/ Hygiene Wound #1 Right,Lateral Foot o Clean wound with Normal Saline or wound cleanser. Wound #2 Right,Distal,Dorsal Foot o Clean wound with Normal Saline or wound cleanser. Wound #7 Right,Dorsal Foot o Clean wound with Normal Saline or wound cleanser. Off-Loading Wound #1 Right,Lateral Foot o Open toe surgical shoe with peg assist. Wound #2 Right,Distal,Dorsal Foot o Open toe surgical shoe with peg assist. Medications-Please add to medication list. Wound  #1 Right,Lateral Foot o Other: - Vancomycin through dialysis for 14 days Wound #2 Right,Distal,Dorsal Foot o Other: - Vancomycin through dialysis for 14 days Wound #7 Right,Dorsal Foot o Other: - Vancomycin through dialysis for 14 days Wound Treatment Wound #1 - Foot Wound Laterality: Right, Lateral Cleanser: Normal Saline (Generic) 3 x Per Week/30 Days Ralph Peterson, Ralph Peterson ([id number]) Discharge Instructions: Wash your hands with soap and water. Remove old dressing, discard into plastic bag and place into trash. Cleanse the wound with Normal Saline prior to applying a  clean dressing using gauze sponges, not tissues or cotton balls. Do not scrub or use excessive force. Pat dry using gauze sponges, not tissue or cotton balls. Primary Dressing: Silvercel Small 2x2 (in/in) 3 x Per Week/30 Days Discharge Instructions: Apply Silvercel Small 2x2 (in/in) as instructed Secondary Dressing: ABD Pad 5x9 (in/in) (Generic) 3 x Per Week/30 Days Discharge Instructions: Cover with ABD pad Secured With: 34M Medipore H Soft Cloth Surgical Tape, 2x2 (in/yd) (DME) (Generic) 3 x Per Week/30 Days Secured With: Conforming Stretch Gauze Bandage 4x75 (in/in) (DME) (Generic) 3 x Per Week/30 Days Discharge Instructions: Apply as directed Wound #2 - Foot Wound Laterality: Dorsal, Right, Distal Cleanser: Normal Saline 3 x Per Week/30 Days Discharge Instructions: Wash your hands with soap and water. Remove old dressing, discard into plastic bag and place into trash. Cleanse the wound with Normal Saline prior to applying a clean dressing using gauze sponges, not tissues or cotton balls. Do not scrub or use excessive force. Pat dry using gauze sponges, not tissue or cotton balls. Primary Dressing: Silvercel 4 1/4x 4 1/4 (in/in) 3 x Per Week/30 Days Discharge Instructions: Apply Silvercel 4 1/4x 4 1/4 (in/in) as instructed Secondary Dressing: ABD Pad 5x9 (in/in) (Generic) 3 x Per Week/30 Days Discharge  Instructions: Cover with ABD pad Secured With: 34M Medipore H Soft Cloth Surgical Tape, 2x2 (in/yd) (DME) (Generic) 3 x Per Week/30 Days Secured With: Conforming Stretch Gauze Bandage 4x75 (in/in) (DME) (Generic) 3 x Per Week/30 Days Discharge Instructions: Apply as directed Wound #7 - Foot Wound Laterality: Dorsal, Right Cleanser: Normal Saline 3 x Per Week/30 Days Discharge Instructions: Wash your hands with soap and water. Remove old dressing, discard into plastic bag and place into trash. Cleanse the wound with Normal Saline prior to applying a clean dressing using gauze sponges, not tissues or cotton balls. Do not scrub or use excessive force. Pat dry using gauze sponges, not tissue or cotton balls. Primary Dressing: Silvercel 4 1/4x 4 1/4 (in/in) 3 x Per Week/30 Days Discharge Instructions: Apply Silvercel 4 1/4x 4 1/4 (in/in) as instructed Secondary Dressing: ABD Pad 5x9 (in/in) (Generic) 3 x Per Week/30 Days Discharge Instructions: Cover with ABD pad Secured With: 34M Medipore H Soft Cloth Surgical Tape, 2x2 (in/yd) 3 x Per Week/30 Days Secured With: Conforming Stretch Gauze Bandage 4x75 (in/in) (Generic) 3 x Per Week/30 Days Discharge Instructions: Apply as directed Electronic Signature(s) Signed: 09/21/2020 2:54:27 PM By: Georges Mouse, Minus Breeding RN Signed: 09/22/2020 11:30:15 AM By: Worthy Keeler PA-C Entered By: Georges Mouse, Minus Breeding on 09/21/2020 14:02:50 Ralph Peterson ([id number]) -------------------------------------------------------------------------------- Problem List Details Patient Name: Ralph Peterson. Date of Service: 09/21/2020 11:00 AM Medical Record Number: [id number] Patient Account Number: [id number] Date of Birth/Sex: [birth year] (55 y.o. M) Treating RN: Dolan Amen Primary Care Provider: Vidal Schwalbe Other Clinician: Jeanine Luz Referring Provider: Vidal Schwalbe Treating Provider/Extender: Skipper Cliche in Treatment: 21 Active  Problems ICD-10 Encounter Code Description Active Date MDM Diagnosis L89.893 Pressure ulcer of other site, stage 3 04/27/2020 No Yes T81.31XA Disruption of external operation (surgical) wound, not elsewhere 04/27/2020 No Yes classified, initial encounter L97.512 Non-pressure chronic ulcer of other part of right foot with fat layer 04/27/2020 No Yes exposed L97.812 Non-pressure chronic ulcer of other part of right lower leg with fat layer 04/27/2020 No Yes exposed E11.621 Type 2 diabetes mellitus with foot ulcer 04/27/2020 No Yes I50.42 Chronic combined systolic (congestive) and diastolic (congestive) heart 04/27/2020 No Yes failure N18.6 End stage renal disease 04/27/2020 No Yes Z99.2  Dependence on renal dialysis 04/27/2020 No Yes Inactive Problems Resolved Problems Electronic Signature(s) Signed: 09/21/2020 11:05:56 AM By: Worthy Keeler PA-C Entered By: Worthy Keeler on 09/21/2020 11:05:55 Ralph Peterson ([id number]) -------------------------------------------------------------------------------- Progress Note Details Patient Name: Ralph Peterson Date of Service: 09/21/2020 11:00 AM Medical Record Number: [id number] Patient Account Number: [id number] Date of Birth/Sex: Oct 11, 1965 (55 y.o. 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 With regard to the plantar wound this is still draining purulent drainage unfortunately the culture did  reveal that he had Pseudomonas as well as Enterococcus. With that being said he is allergic to penicillin which took away the only oral medication for the Enterococcus I did look at linezolid as a possibility but unfortunately there were medication interactions. Also the same was true the Cipro where there were medication interactions. Nonetheless I want to see if we can get him into infectious disease to see if they can work with him to try to find possibly add at home IV antibiotic regimen that can help out at this point. No sharp debridement is can be necessary today. 09/07/2020 upon evaluation today patient appears to be doing unfortunately not too well in regard to his wound. He has been tolerating the dressing changes without complication. Fortunately there is no sign of active infection at this time. No fevers, chills, nausea, vomiting, or diarrhea. The patient did have a repeat surgery going to clean out the abscess and try to work on getting things moving in a better direction about a week and a half ago. He still has sutures in place. With that being said unfortunately he does not appear to be showing signs of a lot of improvement in my opinion. He does have sutures on the plantar aspect still in place and on the dorsal aspect he has a small opening which actually probes all the way down through and actually came out of the plantar aspect. I feel like that he really is having a lot of issues here and I am afraid that if we do not get this taken care of this is can end up with him having a much larger and more extensive amputation than what he was hoping to have to deal with with this. It has already been since June 2021 but has been dealing with this including all the time since the amputation. 09/14/2020 upon evaluation today patient appears to be doing really about the same in regard to the wound on the dorsal surface of his foot. The plantar foot unfortunately still has sutures in place I  really cannot see how things are doing in that regard. With the dorsal foot however this appears to show signs of still having a small opening in the distal aspect which does actually probe down to bone there is also significant purulent drainage we did actually obtain a wound culture today to further evaluate the issue here. 09/21/2020 upon evaluation today patient appears to be doing about the same in regard to his wound on the dorsal surface of his foot and  the plantar foot he still does not have the sutures out at this point. That should hopefully happen in the next week. With that being said the main issue is he continues to have drainage here. It was noted that he had positive findings on culture for Enterobacter. With that being said he cannot take oral penicillin he could do vancomycin and I think that is good to be the ideal thing this to see if we can get him started on IV vancomycin at dialysis. We will get a get in touch with them to try to see if we can coordinate that being done there at the dialysis center. I think that is the ideal situation at this point I would probably start with just 2 weeks of therapy. Objective Constitutional Well-nourished and well-hydrated in no acute distress. Vitals Time Taken: 11:08 AM, Height: 72 in, Weight: 225 lbs, BMI: 30.5, Temperature: 98.0 F, Pulse: 74 bpm, Respiratory Rate: 16 breaths/min, Blood Pressure: 117/79 mmHg. Respiratory normal breathing without difficulty. Psychiatric this patient is able to make decisions and demonstrates good insight into disease process. Alert and Oriented x 3. pleasant and cooperative. General Notes: Upon inspection patient's wound bed actually showed signs of fairly good granulation on the lateral part of his foot as well as the dorsal part of his foot more proximal which actually did require little bit of debridement but nonetheless seems to be making progress. In regard to the distal dorsal foot where he has  basically a opening that extends down into the foot and even exits plantar where the sutures are still in place that this is not doing quite as well unfortunately. I am still concerned about what may be going on in this region. The patient does have a second opinion coming up shortly this month with [facility]. Ralph Peterson, Ralph Peterson ([id number]) Integumentary (Hair, Skin) Wound #1 status is Open. Original cause of wound was Pressure Injury. The date acquired was: 02/20/2020. The wound has been in treatment 21 weeks. The wound is located on the Right,Lateral Foot. The wound measures 1.6cm length x 0.5cm width x 0.2cm depth; 0.628cm^2 area and 0.126cm^3 volume. There is Fat Layer (Subcutaneous Tissue) exposed. There is no tunneling or undermining noted. There is a medium amount of serosanguineous drainage noted. The wound margin is distinct with the outline attached to the wound base. There is medium (34-66%) pink, pale granulation within the wound bed. There is a small (1-33%) amount of necrotic tissue within the wound bed including Adherent Slough. Wound #2 status is Open. Original cause of wound was Surgical Injury. The date acquired was: 04/20/2020. The wound has been in treatment 21 weeks. The wound is located on the Right,Distal,Dorsal Foot. The wound measures 0.1cm length x 0.1cm width x 0.2cm depth; 0.008cm^2 area and 0.002cm^3 volume. There is Fat Layer (Subcutaneous Tissue) exposed. There is no tunneling or undermining noted. There is a medium amount of purulent drainage noted. The wound margin is distinct with the outline attached to the wound base. There is medium (34-66%) pink granulation within the wound bed. There is a medium (34-66%) amount of necrotic tissue within the wound bed including Adherent Slough. Wound #7 status is Open. Original cause of wound was Blister. The date acquired was: 09/05/2020. The wound has been in treatment 2 weeks. The wound is located on the Right,Dorsal Foot. The wound  measures 3cm length x 0.3cm width x 0.1cm depth; 0.707cm^2 area and 0.071cm^3 volume. There is Fat Layer (Subcutaneous Tissue) exposed. There is no tunneling or  undermining noted. There is a none present amount of drainage noted. There is medium (34-66%) pink granulation within the wound bed. There is a medium (34-66%) amount of necrotic tissue within the wound bed including Eschar. 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 There was a Excisional Skin/Subcutaneous Tissue Debridement with a total area of 0.8 sq cm performed by Tommie Sams., PA-C. With the following instrument(s): Curette to remove Non-Viable tissue/material. Material removed includes Callus, Subcutaneous Tissue, and Slough. A time out was conducted at 11:58, prior to the start of the procedure. A Minimum amount of bleeding was controlled with Pressure. The procedure was tolerated well. Post Debridement Measurements: 1.6cm length x 0.5cm width x 0.3cm depth; 0.188cm^3 volume. Post debridement Stage noted as Category/Stage IV. Character of Wound/Ulcer Post Debridement is stable. Post procedure Diagnosis Wound #1: Same as Pre-Procedure Wound #7 Pre-procedure diagnosis of Wound #7 is a Diabetic Wound/Ulcer of the Lower Extremity located on the Right,Dorsal Foot .Severity of Tissue Pre Debridement is: Fat layer exposed. There was a Excisional Skin/Subcutaneous Tissue  Debridement with a total area of 0.9 sq cm performed by Tommie Sams., PA-C. With the following instrument(s): Curette Material removed includes Eschar, Subcutaneous Tissue, and Slough. A time out was conducted at 11:58, prior to the start of the procedure. A Minimum amount of bleeding was controlled with Pressure. The procedure was tolerated well. Post Debridement Measurements: 3cm length x 0.3cm width x 0.2cm depth; 0.141cm^3 volume. Character of Wound/Ulcer Post Debridement is stable. Severity of Tissue Post Debridement is: Fat layer exposed. Post procedure Diagnosis Wound #7: Same as Pre-Procedure Plan Follow-up Appointments: Wound #1 Right,Lateral Foot: Return Appointment in 1 week. Wound #2 Right,Distal,Dorsal Foot: Return Appointment in 1 week. Ralph Peterson, Ralph Peterson ([id number]) Wound #7 Right,Dorsal Foot: Return Appointment in 1 week. Bathing/ Shower/ Hygiene: Wound #1 Right,Lateral Foot: Clean wound with Normal Saline or wound cleanser. Wound #2 Right,Distal,Dorsal Foot: Clean wound with Normal Saline or wound cleanser. Wound #7 Right,Dorsal Foot: Clean wound with Normal Saline or wound cleanser. Off-Loading: Wound #1 Right,Lateral Foot: Open toe surgical shoe with peg assist. Wound #2 Right,Distal,Dorsal Foot: Open toe surgical shoe with peg assist. Medications-Please add to medication list.: Wound #1 Right,Lateral Foot: Other: - Vancomycin through dialysis for 14 days Wound #2 Right,Distal,Dorsal Foot: Other: - Vancomycin through dialysis for 14 days Wound #7 Right,Dorsal Foot: Other: - Vancomycin through dialysis for 14 days WOUND #1: - Foot Wound Laterality: Right, Lateral Cleanser: Normal Saline (Generic) 3 x Per Week/30 Days Discharge Instructions: Wash your hands with soap and water. Remove old dressing, discard into plastic bag and place into trash. Cleanse the wound with Normal Saline prior to applying a clean dressing using gauze sponges, not tissues or  cotton balls. Do not scrub or use excessive force. Pat dry using gauze sponges, not tissue or cotton balls. Primary Dressing: Silvercel Small 2x2 (in/in) 3 x Per Week/30 Days Discharge Instructions: Apply Silvercel Small 2x2 (in/in) as instructed Secondary Dressing: ABD Pad 5x9 (in/in) (Generic) 3 x Per Week/30  Days Discharge Instructions: Cover with ABD pad Secured With: 74M Medipore H Soft Cloth Surgical Tape, 2x2 (in/yd) (DME) (Generic) 3 x Per Week/30 Days Secured With: Conforming Stretch Gauze Bandage 4x75 (in/in) (DME) (Generic) 3 x Per Week/30 Days Discharge Instructions: Apply as directed WOUND #2: - Foot Wound Laterality: Dorsal, Right, Distal Cleanser: Normal Saline 3 x Per Week/30 Days Discharge Instructions: Wash your hands with soap and water. Remove old dressing, discard into plastic bag and place into trash. Cleanse the wound with Normal Saline prior to applying a clean dressing using gauze sponges, not tissues or cotton balls. Do not scrub or use excessive force. Pat dry using gauze sponges, not tissue or cotton balls. Primary Dressing: Silvercel 4 1/4x 4 1/4 (in/in) 3 x Per Week/30 Days Discharge Instructions: Apply Silvercel 4 1/4x 4 1/4 (in/in) as instructed Secondary Dressing: ABD Pad 5x9 (in/in) (Generic) 3 x Per Week/30 Days Discharge Instructions: Cover with ABD pad Secured With: 74M Medipore H Soft Cloth Surgical Tape, 2x2 (in/yd) (DME) (Generic) 3 x Per Week/30 Days Secured With: Conforming Stretch Gauze Bandage 4x75 (in/in) (DME) (Generic) 3 x Per Week/30 Days Discharge Instructions: Apply as directed WOUND #7: - Foot Wound Laterality: Dorsal, Right Cleanser: Normal Saline 3 x Per Week/30 Days Discharge Instructions: Wash your hands with soap and water. Remove old dressing, discard into plastic bag and place into trash. Cleanse the wound with Normal Saline prior to applying a clean dressing using gauze sponges, not tissues or cotton balls. Do not scrub or  use excessive force. Pat dry using gauze sponges, not tissue or cotton balls. Primary Dressing: Silvercel 4 1/4x 4 1/4 (in/in) 3 x Per Week/30 Days Discharge Instructions: Apply Silvercel 4 1/4x 4 1/4 (in/in) as instructed Secondary Dressing: ABD Pad 5x9 (in/in) (Generic) 3 x Per Week/30 Days Discharge Instructions: Cover with ABD pad Secured With: 74M Medipore H Soft Cloth Surgical Tape, 2x2 (in/yd) 3 x Per Week/30 Days Secured With: Conforming Stretch Gauze Bandage 4x75 (in/in) (Generic) 3 x Per Week/30 Days Discharge Instructions: Apply as directed 1. Would recommend currently that we going continue with the wound care measures as before with regard to the foot area on the right lateral side. The patient does not appear to be showing any signs of infection here and overall I think this is doing quite well. 2. With regard to the dorsal foot we will use an alginate over this area. 3. In regard to the plantar foot again we have been basically it is putting something to catch any drainage as he still has sutures/staples in place. We will see patient back for reevaluation in 1 week here in the clinic. If anything worsens or changes patient will contact our office for additional recommendations. Electronic Signature(s) Signed: 09/22/2020 8:08:03 AM By: Worthy Keeler PA-C Entered By: Worthy Keeler on 09/22/2020 08:08:02 Ralph Peterson ([id number]) -------------------------------------------------------------------------------- SuperBill Details Patient Name: Ralph Peterson Date of Service: 09/21/2020 Medical Record Number: [id number] Patient Account Number: [id number] Date of Birth/Sex: 22-Feb-1966 (55 y.o. 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 N18.6 End stage renal disease Z99.2 Dependence on renal dialysis Facility Procedures CPT4 Code: [id number] Description: [id number] - DEB SUBQ TISSUE 20 SQ CM/< Modifier: Quantity: 1 CPT4 Code: Description: ICD-10 Diagnosis Description E11.621 Type 2 diabetes mellitus with foot ulcer Modifier: Quantity: Physician Procedures CPT4 Code: [id number] Description: 11042 - WC PHYS SUBQ TISS 20 SQ CM Modifier: Quantity: 1 CPT4 Code: Description: ICD-10 Diagnosis Description E11.621 Type 2 diabetes mellitus with foot ulcer Modifier: Quantity: Electronic Signature(s) Signed: 09/22/2020 8:08:13 AM By: Worthy Keeler PA-C Previous Signature: 09/21/2020 2:54:27 PM Version By: Georges Mouse, Minus Breeding RN Entered By: Worthy Keeler on 09/22/2020 08:08:13

## 2020-09-26 NOTE — Telephone Encounter (Signed)
Patient called today to reschedule his RLE angio with Dr. Delana Meyer scheduled for 10/10/20. Patient has been rescheduled to 10/17/20 with a 9:00 am arrival to the [facility]. Covid testing on 10/13/20 between 8-1 pm at the [facility]. Pre-procedure instructions will be mailed.

## 2020-09-28 ENCOUNTER — Encounter: Payer: Medicare PPO | Admitting: Physician Assistant

## 2020-09-28 ENCOUNTER — Other Ambulatory Visit: Payer: Self-pay

## 2020-09-28 ENCOUNTER — Encounter: Payer: Self-pay | Admitting: Podiatry

## 2020-09-28 ENCOUNTER — Ambulatory Visit (INDEPENDENT_AMBULATORY_CARE_PROVIDER_SITE_OTHER): Payer: Medicare PPO | Admitting: Podiatry

## 2020-09-28 DIAGNOSIS — T8781 Dehiscence of amputation stump: Secondary | ICD-10-CM | POA: Diagnosis not present

## 2020-09-28 DIAGNOSIS — Z9889 Other specified postprocedural states: Secondary | ICD-10-CM

## 2020-09-28 DIAGNOSIS — L97512 Non-pressure chronic ulcer of other part of right foot with fat layer exposed: Secondary | ICD-10-CM

## 2020-09-28 NOTE — Progress Notes (Signed)
Subjective:  Patient ID: Ralph Peterson, male    DOB: 1966-01-20,  MRN: [id number]  Chief Complaint  Patient presents with  . Routine Post Op    DOS 08/26/2020 IRRIGATION AND DEBRIDEMENT OF RIGHT FOOT     55 y.o. male returns for post-op check.  Patient states is doing well.  He states that he does not have any pain.  Mild tenderness to the area.  He has been staying off as much as he can.  He has been going to wound care center for further wound management.  He states is going really well.  He denies any other acute complaints.  Review of Systems: Negative except as noted in the HPI. Denies N/V/F/Ch.  Past Medical History:  Diagnosis Date  . Anxiety   . Arrhythmia    atrial fibrillation  . Atrial fibrillation ([place])   . CAD (coronary artery disease)    a. 09/2010 Cath/PCI ([facility]): LM nl, LAD 51m D1 80 (small), LCX 358mOM1 30, RI 70 (small), RCA 70 (DES).  . CHF (congestive heart failure) ([facility]  . Coronary artery disease   . COVID-19 virus infection 07/2019  . Diabetes mellitus without complication ([facility]  . ESRD (end stage renal disease) ([facility]  . Hyperlipidemia   . Hypertension   . Ischemic cardiomyopathy    a.  12/2011 Echo ([facility]): NL EF, mod LVH. Mild AS/MS, triv PR/TR. 07/2019 Echo: EF 25-30%, GR1 DD, inf/post HK, low nl RV fxn, mildly dil RA, triv MR, mild Ao sclerosis w/o stenosis.  . NSTEMI (non-ST elevated myocardial infarction) ([facility]  . PAD (peripheral artery disease) ([facility]   a. 03/2016 s/p PTA/DEB R SFA/popliteal/peroneal; b. 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 08/2017 Atherectomy/DEB dist L SFA/popliteal. PTA of L AT; d. 06/2018 PTA/DEB L SFA/popliteal/PT/AT; e. 01/2019 Stable ABIs.  . Sleep apnea     Current Outpatient Medications:  .  albuterol (VENTOLIN HFA) 108 (90 Base) MCG/ACT inhaler, Inhale 2 puffs into the lungs every 6 (six) hours as needed for wheezing or shortness of breath., Disp: , Rfl:  .  apixaban (ELIQUIS) 5 MG TABS tablet, Take 1 tablet  (5 mg total) by mouth 2 (two) times daily., Disp: 60 tablet, Rfl: 3 .  atorvastatin (LIPITOR) 40 MG tablet, Take 40 mg by mouth at bedtime., Disp: , Rfl: 1 .  cinacalcet (SENSIPAR) 30 MG tablet, Take 30 mg by mouth at bedtime., Disp: , Rfl:  .  ciprofloxacin (CIPRO) 500 MG tablet, Take 1 tablet (500 mg total) by mouth 2 (two) times daily., Disp: 20 tablet, Rfl: 0 .  escitalopram (LEXAPRO) 10 MG tablet, Take 10 mg by mouth daily., Disp: , Rfl:  .  fluticasone (FLONASE) 50 MCG/ACT nasal spray, Place 2 sprays into both nostrils 2 (two) times daily., Disp: , Rfl:  .  ibuprofen (ADVIL) 200 MG tablet, Take 400 mg by mouth every 6 (six) hours as needed for mild pain., Disp: , Rfl:  .  insulin detemir (LEVEMIR) 100 UNIT/ML injection, Inject 0.05 mLs (5 Units total) into the skin daily., Disp: , Rfl:  .  loperamide (IMODIUM) 2 MG capsule, Take 2 mg by mouth daily as needed for diarrhea or loose stools., Disp: , Rfl:  .  metoprolol succinate (TOPROL-XL) 50 MG 24 hr tablet, Take 50 mg by mouth 2 (two) times daily., Disp: , Rfl:  .  mirtazapine (REMERON) 7.5 MG tablet, Take 7.5 mg by mouth at bedtime., Disp: , Rfl:  .  montelukast (SINGULAIR) 10 MG tablet, Take 10 mg by mouth at bedtime., Disp: , Rfl:  .  omeprazole (PRILOSEC) 20 MG capsule, Take 20 mg by mouth at bedtime., Disp: , Rfl:  .  sevelamer carbonate (RENVELA) 800 MG tablet, Take 1,600-3,200 mg by mouth See admin instructions. Take 4 tablets ('3200mg'$ ) three times daily with meals and take 2 tablet (1600 mg) by mouth with snacks, Disp: , Rfl:   Social History   Tobacco Use  Smoking Status Former Smoker  . Types: Cigarettes  . Quit date: 2010  . Years since quitting: 12.1  Smokeless Tobacco Never Used    Allergies  Allergen Reactions  . Oxycodone-Acetaminophen Itching  . Gabapentin Other (See Comments)    unknown  . Tramadol Other (See Comments)    unknown  . Penicillins Swelling and Rash    Patient endorses itching/hives and tongue  swelling with penicillin and amoxicillin at some point in the past.    Objective:   There were no vitals filed for this visit. There is no height or weight on file to calculate BMI. Constitutional Well developed. Well nourished.  Vascular Foot warm and well perfused. Capillary refill normal to all digits.   Neurologic Normal speech. Oriented to person, place, and time. Epicritic sensation to light touch grossly present bilaterally.  Dermatologic  skin completely reepithelialized.  No clinical signs of infection noted.  No wound noted.  No malodor present no probing notable noted.  Orthopedic: Tenderness to palpation noted about the surgical site.   Radiographs: None Assessment:   1. Right foot ulcer, with fat layer exposed ([place])   2. S/P foot surgery, right    Plan:  Patient was evaluated and treated and all questions answered.  S/p foot surgery right -Progressing as expected post-operatively. -XR: None -WB Status: Partial weightbearing to the heel in Darco wedge shoe -Sutures: Removed no signs of dehiscence noted.  No clinical signs of infection noted. -Medications: None -Patient is currently on vancomycin IV through dialysis.  I will defer further management to infectious disease as well as the wound care center.  Clinically his wound has improved considerably I took the stitches out today.  No deep ulceration noted.  However patient is a high risk option of losing the digit versus the foot.  Patient states understanding and will continue to monitor it.  He will be scheduled to see the wound care center again next week for further evaluation and management.  Given that the wound care center is managing this patient I will see him back in 6 weeks.  He states understanding  No follow-ups on file.

## 2020-09-28 NOTE — Progress Notes (Addendum)
ERMA, FRIERSON ([id number]) Visit Report for 09/28/2020 Arrival Information Details Patient Name: Ralph Peterson, Ralph Peterson. Date of Service: 09/28/2020 8:00 AM Medical Record Number: [id number] Patient Account Number: [id number] Date of Birth/Sex: 1966-04-20 (55 y.o. M) Treating RN: Carlene Coria Primary Care Zissel Biederman: Vidal Schwalbe Other Clinician: Jeanine Luz Referring Kemani Heidel: Vidal Schwalbe Treating Sherrill Mckamie/Extender: Skipper Cliche in Treatment: 63 Visit Information History Since Last Visit All ordered tests and consults were completed: No Patient Arrived: Wheel Chair Added or deleted any medications: No Arrival Time: 08:22 Any new allergies or adverse reactions: No Accompanied By: self Had a fall or experienced change in No Transfer Assistance: None activities of daily living that may affect Patient Identification Verified: Yes risk of falls: Secondary Verification Process Completed: Yes Signs or symptoms of abuse/neglect since last visito No Patient Requires Transmission-Based Precautions: No Hospitalized since last visit: No Patient Has Alerts: No Implantable device outside of the clinic excluding No cellular tissue based products placed in the center since last visit: Has Dressing in Place as Prescribed: Yes Pain Present Now: No Electronic Signature(s) Signed: 09/28/2020 1:29:26 PM By: Carlene Coria RN Entered By: Carlene Coria on 09/28/2020 08:22:30 Ralph Peterson ([id number]) -------------------------------------------------------------------------------- Clinic Level of Care Assessment Details Patient Name: Ralph Peterson Date of Service: 09/28/2020 8:00 AM Medical Record Number: [id number] Patient Account Number: [id number] Date of Birth/Sex: [birth year] (55 y.o. M) Treating RN: Dolan Amen Primary Care Nnaemeka Samson: Vidal Schwalbe Other Clinician: Jeanine Luz Referring Shaquera Ansley: Vidal Schwalbe Treating Nayana Lenig/Extender: Skipper Cliche in Treatment: 22 Clinic Level of Care Assessment Items TOOL 1 Quantity Score '[]'$  - Use when EandM and Procedure is performed on INITIAL visit 0 ASSESSMENTS - Nursing Assessment / Reassessment '[]'$  - General Physical Exam (combine w/ comprehensive assessment (listed just below) when performed on new 0 pt. evals) '[]'$  - 0 Comprehensive Assessment (HX, ROS, Risk Assessments, Wounds Hx, etc.) ASSESSMENTS - Wound and Skin Assessment / Reassessment '[]'$  - Dermatologic / Skin Assessment (not related to wound area) 0 ASSESSMENTS - Ostomy and/or Continence Assessment and Care '[]'$  - Incontinence Assessment and Management 0 '[]'$  - 0 Ostomy Care Assessment and Management (repouching, etc.) PROCESS - Coordination of Care '[]'$  - Simple Patient / Family Education for ongoing care 0 '[]'$  - 0 Complex (extensive) Patient / Family Education for ongoing care '[]'$  - 0 Staff obtains Programmer, systems, Records, Test Results / Process Orders '[]'$  - 0 Staff telephones HHA, Nursing Homes / Clarify orders / etc '[]'$  - 0 Routine Transfer to another Facility (non-emergent condition) '[]'$  - 0 Routine Hospital Admission (non-emergent condition) '[]'$  - 0 New Admissions / Biomedical engineer / Ordering NPWT, Apligraf, etc. '[]'$  - 0 Emergency Hospital Admission (emergent condition) PROCESS - Special Needs '[]'$  - Pediatric / Minor Patient Management 0 '[]'$  - 0 Isolation Patient Management '[]'$  - 0 Hearing / Language / Visual special needs '[]'$  - 0 Assessment of Community assistance (transportation, D/C planning, etc.) '[]'$  - 0 Additional assistance / Altered mentation '[]'$  - 0 Support Surface(s) Assessment (bed, cushion, seat, etc.) INTERVENTIONS - Miscellaneous '[]'$  - External ear exam 0 '[]'$  - 0 Patient Transfer (multiple staff / Civil Service fast streamer / Similar devices) '[]'$  - 0 Simple Staple / Suture removal (25 or less) '[]'$  - 0 Complex Staple / Suture removal (26 or more) '[]'$  - 0 Hypo/Hyperglycemic Management (do not check if billed  separately) '[]'$  - 0 Ankle / Brachial Index (ABI) - do not check if billed separately Has the patient been seen at the hospital within the last three years:  Yes Total Score: 0 Level Of Care: ____ Ralph Peterson ([id number]) Electronic Signature(s) Signed: 09/28/2020 4:13:11 PM By: Georges Mouse, Minus Breeding RN Entered By: Georges Mouse, Minus Breeding on 09/28/2020 08:41:10 Ralph Peterson ([id number]) -------------------------------------------------------------------------------- Encounter Discharge Information Details Patient Name: Ralph Peterson Date of Service: 09/28/2020 8:00 AM Medical Record Number: [id number] Patient Account Number: [id number] Date of Birth/Sex: 13-Aug-1965 (55 y.o. M) Treating RN: Dolan Amen Primary Care Pricila Bridge: Vidal Schwalbe Other Clinician: Jeanine Luz Referring Idora Brosious: Vidal Schwalbe Treating Shaniya Tashiro/Extender: Skipper Cliche in Treatment: 22 Encounter Discharge Information Items Post Procedure Vitals Discharge Condition: Stable Temperature (F): 98.2 Ambulatory Status: Wheelchair Pulse (bpm): 78 Discharge Destination: Home Respiratory Rate (breaths/min): 18 Transportation: Private Auto Blood Pressure (mmHg): 127/81 Accompanied By: son Schedule Follow-up Appointment: Yes Clinical Summary of Care: Electronic Signature(s) Signed: 09/28/2020 4:13:11 PM By: Georges Mouse, Minus Breeding RN Entered By: Georges Mouse, Kenia on 09/28/2020 08:43:22 Ralph Peterson ([id number]) -------------------------------------------------------------------------------- Lower Extremity Assessment Details Patient Name: Ralph Peterson. Date of Service: 09/28/2020 8:00 AM Medical Record Number: [id number] Patient Account Number: [id number] Date of Birth/Sex: 09-20-1965 (55 y.o. M) Treating RN: Carlene Coria Primary Care Allen Basista: Vidal Schwalbe Other Clinician: Jeanine Luz Referring Adlai Nieblas: Vidal Schwalbe Treating Corlene Sabia/Extender:  Skipper Cliche in Treatment: 22 Edema Assessment Assessed: [Left: No] [Right: No] Edema: [Left: Ye] [Right: s] Calf Left: Right: Point of Measurement: 40 cm From Medial Instep 32.3 cm Ankle Left: Right: Point of Measurement: 10 cm From Medial Instep 22 cm Electronic Signature(s) Signed: 09/28/2020 1:29:26 PM By: Carlene Coria RN Entered By: Carlene Coria on 09/28/2020 08:22:00 Ralph Peterson ([id number]) -------------------------------------------------------------------------------- Multi Wound Chart Details Patient Name: Ralph Peterson. Date of Service: 09/28/2020 8:00 AM Medical Record Number: [id number] Patient Account Number: [id number] Date of Birth/Sex: 1965/11/07 (55 y.o. M) Treating RN: Dolan Amen Primary Care Weylin Plagge: Vidal Schwalbe Other Clinician: Jeanine Luz Referring Chelsy Parrales: Vidal Schwalbe Treating Christy Friede/Extender: Skipper Cliche in Treatment: 22 Vital Signs Height(in): 72 Pulse(bpm): 67 Weight(lbs): 225 Blood Pressure(mmHg): 127/81 Body Mass Index(BMI): 31 Temperature(F): 98.2 Respiratory Rate(breaths/min): 18 Photos: [1:No Photos] [2:No Photos] [7:No Photos] Wound Location: [1:Right, Lateral Foot] [2:Right, Distal, Dorsal Foot] [7:Right, Dorsal Foot] Wounding Event: [1:Pressure Injury] [2:Surgical Injury] [7:Blister] Primary Etiology: [1:Pressure Ulcer] [2:Open Surgical Wound] [7:Diabetic Wound/Ulcer of the Lower Extremity] Comorbid History: [1:Arrhythmia, Congestive Heart Failure, Coronary Artery Disease, Hypertension, Myocardial Infarction, Hypertension, Myocardial Infarction, Hypertension, Myocardial Infarction, Peripheral Venous Disease, Type II Peripheral Venous  Disease, Type II Peripheral Venous Disease, Type II Diabetes, Neuropathy] [2:Arrhythmia, Congestive Heart Failure, Coronary Artery Disease, Diabetes, Neuropathy] [7:Arrhythmia, Congestive Heart Failure, Coronary Artery Disease, Diabetes, Neuropathy] Date Acquired:  [1:02/20/2020] [2:04/20/2020] [7:09/05/2020] Weeks of Treatment: [1:22] [2:22] [7:3] Wound Status: [1:Open] [2:Open] [7:Open] Measurements L x W x D (cm) [1:2x1.7x0.1] [2:0.1x0.1x0.1] [7:1.1x0.5x0.1] Area (cm) : [1:2.67] [2:0.008] [7:0.432] Volume (cm) : [1:0.267] [2:0.001] [7:0.043] % Reduction in Area: [1:47.20%] [2:99.80%] [7:97.50%] % Reduction in Volume: [1:89.40%] [2:99.90%] [7:97.50%] Classification: [1:Category/Stage IV] [2:Full Thickness Without Exposed Support Structures] [7:Grade 2] Exudate Amount: [1:Medium] [2:Medium] [7:None Present] Exudate Type: [1:Serosanguineous] [2:Purulent] [7:N/A] Exudate Color: [1:red, brown] [2:yellow, brown, green] [7:N/A] Wound Margin: [1:Distinct, outline attached] [2:Distinct, outline attached] [7:N/A] Granulation Amount: [1:Medium (34-66%)] [2:Medium (34-66%)] [7:Large (67-100%)] Granulation Quality: [1:Pink, Pale] [2:Pink] [7:Pink] Necrotic Amount: [1:Small (1-33%)] [2:Medium (34-66%)] [7:None Present (0%)] Exposed Structures: [1:Fat Layer (Subcutaneous Tissue): Fat Layer (Subcutaneous Tissue): Fat Layer (Subcutaneous Tissue): Yes Fascia: No Tendon: No Muscle: No Joint: No Bone: No None] [2:Yes Fascia: No Tendon: No Muscle: No Joint: No Bone: No Large (67-100%)] [7:Yes Fascia:  No Tendon: No Muscle: No Joint: No Bone: No None] Treatment Notes Electronic Signature(s) Signed: 09/28/2020 4:13:11 PM By: Georges Mouse, Minus Breeding RN Entered By: Georges Mouse, Minus Breeding on 09/28/2020 08:28:20 Ralph Peterson ([id number]) -------------------------------------------------------------------------------- Ralph Peterson Details Patient Name: Ralph Peterson Date of Service: 09/28/2020 8:00 AM Medical Record Number: [id number] Patient Account Number: [id number] Date of Birth/Sex: 02-14-1966 (55 y.o. M) Treating RN: Dolan Amen Primary Care Sahory Nordling: Vidal Schwalbe Other Clinician: Jeanine Luz Referring Kelvin Sennett: Vidal Schwalbe Treating Zeek Rostron/Extender: Skipper Cliche in Treatment: 22 Active Inactive Necrotic Tissue Nursing Diagnoses: Impaired tissue integrity related to necrotic/devitalized tissue Knowledge deficit related to management of necrotic/devitalized tissue Goals: Necrotic/devitalized tissue will be minimized in the wound bed Date Initiated: 05/09/2020 Target Resolution Date: 05/09/2020 Goal Status: Active Interventions: Assess patient pain level pre-, during and post procedure and prior to discharge Provide education on necrotic tissue and debridement process Treatment Activities: Enzymatic debridement : 05/09/2020 Notes: Electronic Signature(s) Signed: 09/28/2020 4:13:11 PM By: Georges Mouse, Minus Breeding RN Entered By: Georges Mouse, Minus Breeding on 09/28/2020 08:28:13 Ralph Peterson ([id number]) -------------------------------------------------------------------------------- Pain Assessment Details Patient Name: Ralph Peterson. Date of Service: 09/28/2020 8:00 AM Medical Record Number: [id number] Patient Account Number: [id number] Date of Birth/Sex: 02-09-1966 (55 y.o. M) Treating RN: Carlene Coria Primary Care Tache Bobst: Vidal Schwalbe Other Clinician: Jeanine Luz Referring Alecea Trego: Vidal Schwalbe Treating Talani Brazee/Extender: Skipper Cliche in Treatment: 22 Active Problems Location of Pain Severity and Description of Pain Patient Has Paino No Site Locations Pain Management and Medication Current Pain Management: Electronic Signature(s) Signed: 09/28/2020 1:29:26 PM By: Carlene Coria RN Entered By: Carlene Coria on 09/28/2020 08:23:06 Ralph Peterson ([id number]) -------------------------------------------------------------------------------- Patient/Caregiver Education Details Patient Name: Ralph Peterson Date of Service: 09/28/2020 8:00 AM Medical Record Number: [id number] Patient Account Number: [id number] Date of Birth/Gender: 1965/09/27 (55 y.o.  M) Treating RN: Dolan Amen Primary Care Physician: Vidal Schwalbe Other Clinician: Jeanine Luz Referring Physician: Vidal Schwalbe Treating Physician/Extender: Skipper Cliche in Treatment: 22 Education Assessment Education Provided To: Patient Education Topics Provided Wound Debridement: Methods: Explain/Verbal Responses: State content correctly Electronic Signature(s) Signed: 09/28/2020 4:13:11 PM By: Georges Mouse, Minus Breeding RN Entered By: Georges Mouse, Minus Breeding on 09/28/2020 08:42:19 Ralph Peterson ([id number]) -------------------------------------------------------------------------------- Wound Assessment Details Patient Name: Ralph Peterson Date of Service: 09/28/2020 8:00 AM Medical Record Number: [id number] Patient Account Number: [id number] Date of Birth/Sex: 06-06-1966 (55 y.o. M) Treating RN: Carlene Coria Primary Care Brenan Modesto: Vidal Schwalbe Other Clinician: Jeanine Luz Referring Rexanne Inocencio: Vidal Schwalbe Treating Arun Herrod/Extender: Skipper Cliche in Treatment: 22 Wound Status Wound Number: 1 Primary Pressure Ulcer Etiology: Wound Location: Right, Lateral Foot Wound Open Wounding Event: Pressure Injury Status: Date Acquired: 02/20/2020 Comorbid Arrhythmia, Congestive Heart Failure, Coronary Artery Weeks Of Treatment: 22 History: Disease, Hypertension, Myocardial Infarction, Peripheral Clustered Wound: No Venous Disease, Type II Diabetes, Neuropathy Photos Wound Measurements Length: (cm) 2 Width: (cm) 1.7 Depth: (cm) 0.1 Area: (cm) 2.67 Volume: (cm) 0.267 % Reduction in Area: 47.2% % Reduction in Volume: 89.4% Epithelialization: None Tunneling: No Undermining: No Wound Description Classification: Category/Stage IV Wound Margin: Distinct, outline attached Exudate Amount: Medium Exudate Type: Serosanguineous Exudate Color: red, brown Foul Odor After Cleansing: No Slough/Fibrino Yes Wound Bed Granulation Amount: Medium  (34-66%) Exposed Structure Granulation Quality: Pink, Pale Fascia Exposed: No Necrotic Amount: Small (1-33%) Fat Layer (Subcutaneous Tissue) Exposed: Yes Necrotic Quality: Adherent Slough Tendon Exposed: No Muscle Exposed: No Joint Exposed: No Bone Exposed: No Electronic Signature(s) Signed: 09/28/2020 1:29:26 PM By: Carlene Coria RN Entered By:  Carlene Coria on 09/28/2020 08:29:59 Ralph Peterson, Ralph Peterson ([id number]) -------------------------------------------------------------------------------- Wound Assessment Details Patient Name: Ralph Peterson, Ralph Peterson. Date of Service: 09/28/2020 8:00 AM Medical Record Number: [id number] Patient Account Number: [id number] Date of Birth/Sex: [birth year] (55 y.o. M) Treating RN: Carlene Coria Primary Care Aizlynn Digilio: Vidal Schwalbe Other Clinician: Jeanine Luz Referring Kolbi Altadonna: Vidal Schwalbe Treating Sapna Padron/Extender: Skipper Cliche in Treatment: 22 Wound Status Wound Number: 2 Primary Open Surgical Wound Etiology: Wound Location: Right, Distal, Dorsal Foot Wound Open Wounding Event: Surgical Injury Status: Date Acquired: 04/20/2020 Comorbid Arrhythmia, Congestive Heart Failure, Coronary Artery Weeks Of Treatment: 22 History: Disease, Hypertension, Myocardial Infarction, Peripheral Clustered Wound: No Venous Disease, Type II Diabetes, Neuropathy Photos Wound Measurements Length: (cm) 0.1 Width: (cm) 0.1 Depth: (cm) 0.1 Area: (cm) 0.008 Volume: (cm) 0.001 % Reduction in Area: 99.8% % Reduction in Volume: 99.9% Epithelialization: Large (67-100%) Tunneling: No Undermining: No Wound Description Classification: Full Thickness Without Exposed Support Structures Wound Margin: Distinct, outline attached Exudate Amount: Medium Exudate Type: Purulent Exudate Color: yellow, brown, green Foul Odor After Cleansing: No Slough/Fibrino Yes Wound Bed Granulation Amount: Medium (34-66%) Exposed Structure Granulation Quality:  Pink Fascia Exposed: No Necrotic Amount: Medium (34-66%) Fat Layer (Subcutaneous Tissue) Exposed: Yes Necrotic Quality: Adherent Slough Tendon Exposed: No Muscle Exposed: No Joint Exposed: No Bone Exposed: No Treatment Notes Wound #2 (Foot) Wound Laterality: Dorsal, Right, Distal Cleanser Normal Saline Discharge Instruction: Wash your hands with soap and water. Remove old dressing, discard into plastic bag and place into trash. Cleanse the wound with Normal Saline prior to applying a clean dressing using gauze sponges, not tissues or cotton balls. Do not Nofsinger, Deric S. ([id number]) scrub or use excessive force. Pat dry using gauze sponges, not tissue or cotton balls. Peri-Wound Care Topical Primary Dressing Silvercel 4 1/4x 4 1/4 (in/in) Discharge Instruction: Apply Silvercel 4 1/4x 4 1/4 (in/in) as instructed Secondary Dressing ABD Pad 5x9 (in/in) Discharge Instruction: Cover with ABD pad Secured With 10M Medipore H Soft Cloth Surgical Tape, 2x2 (in/yd) Conforming Stretch Gauze Bandage 4x75 (in/in) Discharge Instruction: Apply as directed Compression Wrap Compression Stockings Add-Ons Electronic Signature(s) Signed: 09/28/2020 1:29:26 PM By: Carlene Coria RN Entered By: Carlene Coria on 09/28/2020 08:29:04 Ralph Peterson ([id number]) -------------------------------------------------------------------------------- Wound Assessment Details Patient Name: Ralph Peterson. Date of Service: 09/28/2020 8:00 AM Medical Record Number: [id number] Patient Account Number: [id number] Date of Birth/Sex: 05-Apr-1966 (55 y.o. M) Treating RN: Carlene Coria Primary Care Rafel Garde: Vidal Schwalbe Other Clinician: Jeanine Luz Referring Wright Gravely: Vidal Schwalbe Treating Ladanian Kelter/Extender: Skipper Cliche in Treatment: 22 Wound Status Wound Number: 7 Primary Diabetic Wound/Ulcer of the Lower Extremity Etiology: Wound Location: Right, Dorsal Foot Wound Open Wounding  Event: Blister Status: Date Acquired: 09/05/2020 Comorbid Arrhythmia, Congestive Heart Failure, Coronary Artery Weeks Of Treatment: 3 History: Disease, Hypertension, Myocardial Infarction, Peripheral Clustered Wound: No Venous Disease, Type II Diabetes, Neuropathy Photos Wound Measurements Length: (cm) 1.1 Width: (cm) 0.5 Depth: (cm) 0.1 Area: (cm) 0.432 Volume: (cm) 0.043 % Reduction in Area: 97.5% % Reduction in Volume: 97.5% Epithelialization: None Tunneling: No Undermining: No Wound Description Classification: Grade 2 Exudate Amount: None Present Foul Odor After Cleansing: No Slough/Fibrino No Wound Bed Granulation Amount: Large (67-100%) Exposed Structure Granulation Quality: Pink Fascia Exposed: No Necrotic Amount: None Present (0%) Fat Layer (Subcutaneous Tissue) Exposed: Yes Tendon Exposed: No Muscle Exposed: No Joint Exposed: No Bone Exposed: No Treatment Notes Wound #7 (Foot) Wound Laterality: Dorsal, Right Cleanser Normal Saline Discharge Instruction: Wash your hands with soap and water. Remove old dressing,  discard into plastic bag and place into trash. Cleanse the wound with Normal Saline prior to applying a clean dressing using gauze sponges, not tissues or cotton balls. Do not scrub or use excessive force. Pat dry using gauze sponges, not tissue or cotton balls. Peri-Wound Care Ralph Peterson, Ralph Peterson ([id number]) Topical Primary Dressing Silvercel 4 1/4x 4 1/4 (in/in) Discharge Instruction: Apply Silvercel 4 1/4x 4 1/4 (in/in) as instructed Secondary Dressing ABD Pad 5x9 (in/in) Discharge Instruction: Cover with ABD pad Secured With 56M [place] Surgical Tape, 2x2 (in/yd) Conforming Stretch Gauze Bandage 4x75 (in/in) Discharge Instruction: Apply as directed Compression Wrap Compression Stockings Add-Ons Electronic Signature(s) Signed: 09/28/2020 1:29:26 PM By: Carlene Coria RN Entered By: Carlene Coria on 09/28/2020 08:29:32 Ralph Peterson ([id number]) -------------------------------------------------------------------------------- Wound Assessment Details Patient Name: Ralph Peterson. Date of Service: 09/28/2020 8:00 AM Medical Record Number: [id number] Patient Account Number: [id number] Date of Birth/Sex: February 29, 1966 (55 y.o. M) Treating RN: Dolan Amen Primary Care Ludmila Ebarb: Vidal Schwalbe Other Clinician: Jeanine Luz Referring Braxton Vantrease: Vidal Schwalbe Treating Maddux First/Extender: Skipper Cliche in Treatment: 22 Wound Status Wound Number: 8 Primary Etiology: Pressure Ulcer Wound Location: Right Foot Wound Status: Open Wounding Event: Gradually Appeared Date Acquired: 09/07/2020 Weeks Of Treatment: 0 Clustered Wound: No Wound Measurements Length: (cm) Width: (cm) Depth: (cm) Area: (cm) Volume: (cm) 0.4 % Reduction in Area: 0.4 % Reduction in Volume: 0.1 0.126 0.013 Electronic Signature(s) Signed: 09/28/2020 4:13:11 PM By: Georges Mouse, Minus Breeding RN Entered By: Georges Mouse, Minus Breeding on 09/28/2020 08:38:22 Ralph Peterson ([id number]) -------------------------------------------------------------------------------- Vitals Details Patient Name: Ralph Peterson Date of Service: 09/28/2020 8:00 AM Medical Record Number: [id number] Patient Account Number: [id number] Date of Birth/Sex: [birth year] (55 y.o. M) Treating RN: Carlene Coria Primary Care Zaila Crew: Vidal Schwalbe Other Clinician: Jeanine Luz Referring Aerica Rincon: Vidal Schwalbe Treating Cyntia Staley/Extender: Skipper Cliche in Treatment: 22 Vital Signs Time Taken: 08:22 Temperature (F): 98.2 Height (in): 72 Pulse (bpm): 78 Weight (lbs): 225 Respiratory Rate (breaths/min): 18 Body Mass Index (BMI): 30.5 Blood Pressure (mmHg): 127/81 Reference Range: 80 - 120 mg / dl Electronic Signature(s) Signed: 09/28/2020 1:29:26 PM By: Carlene Coria RN Entered By: Carlene Coria on 09/28/2020 08:23:00

## 2020-09-28 NOTE — Progress Notes (Signed)
ZARION, HINSDALE ([id number]) Visit Report for 09/28/2020 Chief Complaint Document Details Patient Name: [name], [name]. Date of Service: 09/28/2020 8:00 AM Medical Record Number: [id number] Patient Account Number: [id number] Date of Birth/Sex: 1965/08/18 (55 y.o. 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, stage 3 04/27/2020 No Yes T81.31XA Disruption of external operation (surgical) wound, not elsewhere 04/27/2020 No Yes classified, initial encounter L97.512 Non-pressure chronic ulcer of other part of right foot with fat layer 04/27/2020 No Yes exposed L97.812 Non-pressure chronic ulcer of other part of right lower leg with fat layer 04/27/2020 No Yes exposed E11.621 Type 2 diabetes mellitus with foot ulcer 04/27/2020 No Yes 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

## 2020-10-05 ENCOUNTER — Encounter: Payer: Medicare PPO | Admitting: Physician Assistant

## 2020-10-05 ENCOUNTER — Other Ambulatory Visit: Payer: Self-pay

## 2020-10-05 DIAGNOSIS — T8781 Dehiscence of amputation stump: Secondary | ICD-10-CM | POA: Diagnosis not present

## 2020-10-05 NOTE — Progress Notes (Addendum)
Ralph Peterson, Ralph Peterson ([id number]) Visit Report for 10/05/2020 Chief Complaint Document Details Patient Name: Ralph Peterson, OR. Date of Service: 10/05/2020 12:45 PM Medical Record Number: [id number] Patient Account Number: [id number] Date of Birth/Sex: October 26, 1965 (55 y.o. 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 M) Treating RN: Dolan Amen Primary Care Provider: Vidal Schwalbe Other Clinician: Jeanine Luz Referring Provider: Vidal Schwalbe Treating Provider/Extender: Skipper Cliche in Treatment: 23 Debridement Performed for Wound #1 Right,Distal,Lateral Foot Assessment: Performed By: Physician Tommie Sams., PA-C Debridement Type: Debridement Level of Consciousness (Pre- Awake and Alert procedure): Pre-procedure Verification/Time Out Yes - 13:23 Taken: Start Time: 13:23 Pain Control: Lidocaine 4% Topical Solution Total Area Debrided (L x W): 0.4 (cm) x 0.3 (cm) = 0.12 (cm) Tissue and other material Viable, Non-Viable, Slough, Subcutaneous, Slough debrided: Level: Skin/Subcutaneous Tissue Debridement Description: Excisional Instrument: Curette Bleeding: Minimum Hemostasis Achieved: Pressure Response to Treatment: Procedure was  tolerated well Level of Consciousness (Post- Awake and Alert procedure): Post Debridement Measurements of Total Wound Length: (cm) 0.4 Stage: Category/Stage IV Width: (cm) 0.3 Depth: (cm) 0.2 Volume: (cm) 0.019 Character of Wound/Ulcer Post Debridement: Stable Post Procedure Diagnosis Same as Pre-procedure Electronic Signature(s) Signed: 10/05/2020 3:42:51 PM By: Worthy Keeler PA-C Signed: 10/05/2020 4:04:06 PM By: Georges Mouse, Minus Breeding RN Entered By: Georges Mouse, Minus Breeding on 10/05/2020 13:24:12 Ralph Peterson ([id number]) -------------------------------------------------------------------------------- Debridement Details Patient Name: Ralph Peterson. Date of Service: 10/05/2020 12:45 PM Medical Record Number: [id number] Patient Account Number: [id number] Date of Birth/Sex: 07-Dec-1965 (55 y.o. M) Treating RN: Dolan Amen Primary Care Provider: Vidal Schwalbe Other Clinician: Jeanine Luz Referring Provider: Vidal Schwalbe Treating Provider/Extender: Skipper Cliche in Treatment: 23 Debridement Performed for Wound #8 Right,Proximal,Lateral Foot Assessment: Performed By: Physician Tommie Sams., PA-C Debridement Type: Debridement Level of Consciousness (Pre- Awake and Alert procedure): Pre-procedure Verification/Time Out Yes - 13:23 Taken: Start Time: 13:23 Pain Control: Lidocaine 4% Topical Solution Total Area Debrided (L x W): 0.3 (cm) x 0.4 (cm) = 0.12 (cm) Tissue and other material Viable, Non-Viable, Slough, Subcutaneous, Slough debrided: Level: Skin/Subcutaneous Tissue Debridement Description: Excisional Instrument: Curette Bleeding: Minimum Hemostasis Achieved: Pressure Response to Treatment: Procedure was tolerated well Level of Consciousness (Post- Awake and Alert procedure): Post Debridement Measurements of Total Wound Length: (cm) 0.3 Stage: Unstageable/Unclassified Width: (cm) 0.4 Depth: (cm) 0.2 Volume: (cm)  0.019 Character of Wound/Ulcer Post Debridement: Stable Post Procedure Diagnosis Same as Pre-procedure Electronic Signature(s) Signed: 10/05/2020 3:42:51 PM By: Worthy Keeler PA-C Signed: 10/05/2020 4:04:06 PM By: Georges Mouse, Minus Breeding RN Entered By: Georges Mouse, Minus Breeding on 10/05/2020 13:31:41 Ralph Peterson ([id number]) -------------------------------------------------------------------------------- HPI Details Patient Name: Ralph Peterson. Date of Service: 10/05/2020 12:45 PM Medical Record Number: [id number] Patient Account Number: [id number] Date of Birth/Sex: 11/23/1965 (55 y.o. M) Treating RN: Dolan Amen Primary Care Provider: Vidal Schwalbe Other Clinician: Jeanine Luz Referring Provider: Vidal Schwalbe Treating Provider/Extender: Skipper Cliche in Treatment: 23 History of Present Illness HPI Description: 08/17/2020 upon evaluation today patient actually appears to  be doing decently well today which is good news in regard to the dorsal wound which is almost healed and the lateral wound also doing much better. With regard to the plantar wound this is still draining purulent drainage unfortunately the culture did reveal that he had Pseudomonas as well as Enterococcus. With that being said he is allergic to penicillin which took away the only oral medication for the Enterococcus I did look at linezolid as a possibility but unfortunately there were medication interactions. Also the same was true the Cipro where there were medication interactions. Nonetheless I want to see if we can get him into infectious disease to see if they can work with him to try to find possibly add at home IV antibiotic regimen that can help out at this point. No sharp debridement is can be necessary today. 09/07/2020 upon evaluation today patient appears to be doing unfortunately not too well in regard to his wound. He has been tolerating the dressing changes without complication.  Fortunately there is no sign of active infection at this time. No fevers, chills, nausea, vomiting, or diarrhea. The patient did have a repeat surgery going to clean out the abscess and try to work on getting things moving in a better direction about a week and a half ago. He still has sutures in place. With that being said unfortunately he does not appear to be showing signs of a lot of improvement in my opinion. He does have sutures on the plantar aspect still in place and on the dorsal aspect he has a small opening which actually probes all the way down through and actually came out of the plantar aspect. I feel like that he really is having a lot of issues here and I am afraid that if we do not get this taken care of this is can end up with him having a much larger and more extensive amputation than what he was hoping to have to deal with with this. It has already been since June 2021 but has been dealing with this including all the time since the amputation. 09/14/2020 upon evaluation today patient appears to be doing really about the same in regard to the wound on the dorsal surface of his foot. The plantar foot unfortunately still has sutures in place I really cannot see how things are doing in that regard. With the dorsal foot however this appears to show signs of still having a small opening in the distal aspect which does actually probe down to bone there is also significant purulent drainage we did actually obtain a wound culture today to further evaluate the issue here. 09/21/2020 upon evaluation today patient appears to be doing about the same in regard to his wound on the dorsal surface of his foot and the plantar foot he still does not have the sutures out at this point. That should hopefully happen in the next week. With that being said the main issue is he continues to have drainage here. It was noted that he had positive findings on culture for Enterobacter. With that being said he  cannot take oral penicillin he could do vancomycin and I think that is good to be the ideal thing this to see if we can get him started on IV vancomycin at dialysis. We will get a get in touch with them to try to see if we can coordinate that being done there at the dialysis center. I think that is the ideal situation at this point I  would probably start with just 2 weeks of therapy. 09/28/2020 upon evaluation today patient appears to be doing well with regard to his wounds in general. He also has back on the vancomycin after the culture results and this seems to be doing excellent for him. All the wounds are showing signs of good improvement. No fevers, chills, nausea, vomiting, or diarrhea. 10/05/2020 upon evaluation today patient appears to actually be doing excellent in regard to his foot. Fortunately there is a lot of signs of new granulation epithelization at this point. There does not appear to be any evidence of active infection which is great news and overall very pleased with where things stand. In fact the open wound along the plantar aspect of his foot as well as the dorsal part of his foot has dramatically improved since we went forward with the vancomycin and requested that the dialysis center provide this to him by way of IV antibiotics with his dialysis. Overall I think that has made a dramatic improvement and overall his wound seems to be doing significantly better at this point. No fevers, chills, nausea, vomiting, or diarrhea. The patient was set to have his second opinion appointment with podiatry [facility] next week but to be perfectly honest I am not even certain that that is necessary at this point I do not think there is much that they would be able to do for him nor that he really would want them to do considering how well things look at this time. Electronic Signature(s) Signed: 10/05/2020 1:43:42 PM By: Worthy Keeler PA-C Entered By: Worthy Keeler on 10/05/2020  13:43:42 Ralph Peterson ([id number]) -------------------------------------------------------------------------------- Physical Exam Details Patient Name: Ralph Peterson Date of Service: 10/05/2020 12:45 PM Medical Record Number: [id number] Patient Account Number: [id number] Date of Birth/Sex: Apr 07, 1966 (55 y.o. M) Treating RN: Dolan Amen Primary Care Provider: Vidal Schwalbe Other Clinician: Jeanine Luz Referring Provider: Vidal Schwalbe Treating Provider/Extender: Skipper Cliche in Treatment: 69 Constitutional Well-nourished and well-hydrated in no acute distress. Respiratory normal breathing without difficulty. Psychiatric this patient is able to make decisions and demonstrates good insight into disease process. Alert and Oriented x 3. pleasant and cooperative. Notes Upon inspection patient's wound bed actually showed signs of good granulation epithelization at this point. There does not appear to be any evidence of infection and again the dorsal/distal foot as well as the plantar foot are both completely healed. As far as the lateral foot proximal and distal they both are still open I did require some sharp debridement. The dorsal surface of the foot proximally also did require debridement. Everything else appears to be doing quite well. Electronic Signature(s) Signed: 10/05/2020 1:44:31 PM By: Worthy Keeler PA-C Previous Signature: 10/05/2020 1:44:10 PM Version By: Worthy Keeler PA-C Entered By: Worthy Keeler on 10/05/2020 13:44:31 Ralph Peterson ([id number]) -------------------------------------------------------------------------------- Physician Orders Details Patient Name: Ralph Peterson Date of Service: 10/05/2020 12:45 PM Medical Record Number: [id number] Patient Account Number: [id number] Date of Birth/Sex: [birth year] (55 y.o. 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 Bathing/ Shower/ Hygiene o Clean wound with Normal Saline or wound cleanser. Off-Loading o Open toe surgical shoe with peg assist. - Right foot Additional Orders / Instructions o Other: - Cancel [facility] POD. appt Wound Treatment Wound #1 - Foot Wound Laterality: Right, Lateral, Distal Cleanser: Normal Saline (Generic) 3 x Per Week/30 Days Discharge Instructions: Wash your hands with soap and water. Remove old dressing, discard into plastic bag and place into trash. Cleanse the wound with Normal Saline prior to applying a clean dressing using gauze sponges, not tissues or cotton balls. Do not scrub or use excessive force. Pat dry using gauze sponges, not tissue or cotton balls. Primary Dressing: Prisma 4.34 (in) 3 x Per Week/30 Days Discharge Instructions: Moisten w/normal saline or sterile water; Cover wound as directed. Do not remove from wound bed. Secondary Dressing: ABD Pad 5x9 (in/in) (Generic) 3 x Per Week/30 Days Discharge Instructions: Cover with ABD pad Secured With: 64M Medipore H Soft Cloth Surgical Tape, 2x2 (in/yd) (Generic) 3 x Per Week/30 Days Secured With: Conforming Stretch Gauze Bandage 4x75 (in/in) (Generic) 3 x  Per Week/30 Days Discharge Instructions: Apply as directed Wound #7 - Foot Wound Laterality: Dorsal, Right Cleanser: Normal Saline (Generic) 3 x Per Week/30 Days Discharge Instructions: Wash your hands with soap and water. Remove old dressing, discard into plastic bag and place into trash. Cleanse the wound with Normal Saline prior to applying a clean dressing using gauze sponges, not tissues or cotton balls. Do not scrub or use excessive force. Pat dry using gauze sponges, not tissue or cotton balls. Primary Dressing: Prisma 4.34 (in) 3 x Per Week/30 Days Discharge Instructions: Moisten w/normal saline or sterile water; Cover wound as directed. Do not remove from wound bed. Primary Dressing: Curad Oil Emulsion Dressing 3x3 (in/in) 3 x Per Week/30 Days COBE, SAMANIEGO ([id number]) Secondary Dressing: ABD Pad 5x9 (in/in) (Generic) 3 x Per Week/30 Days Discharge Instructions: Cover with ABD pad Secured With: 64M Medipore H Soft Cloth Surgical Tape, 2x2 (in/yd) (Generic) 3 x Per Week/30 Days Secured With: Conforming Stretch Gauze Bandage 4x75 (in/in) (Generic) 3 x Per Week/30 Days Discharge Instructions: Apply as directed Wound #8 - Foot Wound Laterality: Right, Lateral, Proximal Cleanser: Normal Saline (Generic) 3 x Per Week/30 Days Discharge Instructions: Wash your hands with soap and water. Remove old dressing, discard into plastic bag and place into trash. Cleanse the wound with Normal Saline prior to applying a clean dressing using gauze sponges, not tissues or cotton balls. Do not scrub or use excessive force. Pat dry using gauze sponges, not tissue or cotton balls. Primary Dressing: Prisma 4.34 (in) 3 x Per Week/30 Days Discharge Instructions: Moisten w/normal saline or sterile water; Cover wound as directed. Do not remove from wound bed. Primary Dressing: Curad Oil Emulsion Dressing 3x3 (in/in) 3 x Per Week/30 Days Secondary Dressing: ABD Pad 5x9 (in/in) (Generic) 3 x Per Week/30  Days Discharge Instructions: Cover with ABD pad Secured With: 64M Medipore H Soft Cloth Surgical Tape, 2x2 (in/yd) (Generic) 3 x Per Week/30 Days Secured With: Conforming Stretch Gauze Bandage 4x75 (in/in) (Generic) 3 x Per Week/30 Days Discharge Instructions: Apply as directed Electronic Signature(s) Signed: 10/05/2020 3:42:51 PM By: Worthy Keeler PA-C Signed: 10/05/2020 4:04:06 PM By: Georges Mouse, Minus Breeding RN Entered By:  Georges Mouse, Kenia on 10/05/2020 13:44:17 IZZIAH, CAPPER ([id number]) -------------------------------------------------------------------------------- Problem List Details Patient Name: RHYDIAN, ZEPP. Date of Service: 10/05/2020 12:45 PM Medical Record Number: [id number] Patient Account Number: [id number] Date of Birth/Sex: [birth year] (55 y.o. M) Treating RN: Dolan Amen Primary Care Provider: Vidal Schwalbe Other Clinician: Jeanine Luz Referring Provider: Vidal Schwalbe Treating Provider/Extender: Skipper Cliche in Treatment: 23 Active Problems ICD-10 Encounter Code Description Active Date MDM Diagnosis L89.893 Pressure ulcer of other site, stage 3 04/27/2020 No Yes T81.31XA Disruption of external operation (surgical) wound, not elsewhere 04/27/2020 No Yes classified, initial encounter L97.512 Non-pressure chronic ulcer of other part of right foot with fat layer 04/27/2020 No Yes exposed L97.812 Non-pressure chronic ulcer of other part of right lower leg with fat layer 04/27/2020 No Yes exposed E11.621 Type 2 diabetes mellitus with foot ulcer 04/27/2020 No Yes I50.42 Chronic combined systolic (congestive) and diastolic (congestive) heart 04/27/2020 No Yes failure N18.6 End stage renal disease 04/27/2020 No Yes Z99.2 Dependence on renal dialysis 04/27/2020 No Yes Inactive Problems Resolved Problems Electronic Signature(s) Signed: 10/05/2020 1:06:26 PM By: Worthy Keeler PA-C Entered By: Worthy Keeler on 10/05/2020 13:06:26 Ralph Peterson ([id number]) -------------------------------------------------------------------------------- Progress Note Details Patient Name: Ralph Peterson Date of Service: 10/05/2020 12:45 PM Medical Record Number: [id number] Patient Account Number: [id number] Date of Birth/Sex: 03/02/1966 (55 y.o. 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 With regard to the plantar wound this is still draining purulent drainage unfortunately the culture did reveal that he had Pseudomonas as well as Enterococcus. With that being said he is allergic to penicillin which took away the only oral medication for the Enterococcus I did look at linezolid as a possibility but unfortunately there were medication interactions. Also the same was true the Cipro where there were medication interactions. Nonetheless I want to see if we can get him into infectious disease to see if they can work with him to try to find possibly add at home IV antibiotic regimen that can help out at this point. No sharp debridement is can be necessary today. 09/07/2020 upon evaluation today patient appears to be doing unfortunately not too well in regard to his wound. He has been tolerating the dressing changes without complication. Fortunately there is no sign of active infection at this time. No fevers, chills, nausea, vomiting, or diarrhea. The patient did have a repeat surgery going to clean  out the abscess and try to work on getting things moving in a better direction about a week and a half ago. He still has sutures in place. With that being said unfortunately he does not appear to be showing signs of a lot of improvement in my opinion. He does have sutures on the plantar aspect still in place and on the dorsal aspect he has a small opening which actually probes all the way down through and actually came out of the plantar aspect. I feel like that he really is having a lot of issues here and I am afraid that if we do not  get this taken care of this is can end up with him having a much larger and more extensive amputation than what he was hoping to have to deal with with this. It has already been since June 2021 but has been dealing with this including all the time since the amputation. 09/14/2020 upon evaluation today patient appears to be doing really about the same in regard to the wound on the dorsal surface of his foot. The plantar foot unfortunately still has sutures in place I really cannot see how things are doing in that regard. With the dorsal foot however this appears to show signs of still having a small opening in the distal aspect which does actually probe down to bone there is also significant purulent drainage we did actually obtain a wound culture today to further evaluate the issue here. 09/21/2020 upon evaluation today patient appears to be doing about the same in regard to his wound on the dorsal surface of his foot and the plantar foot he still does not have the sutures out at this point. That should hopefully happen in the next week. With that being said the main issue is he continues to have drainage here. It was noted that he had positive findings on culture for Enterobacter. With that being said he cannot take oral penicillin he could do vancomycin and I think that is good to be the ideal thing this to see if we can get him started on IV vancomycin at dialysis. We  will get a get in touch with them to try to see if we can coordinate that being done there at the dialysis center. I think that is the ideal situation at this point I would probably start with just 2 weeks of therapy. 09/28/2020 upon evaluation today patient appears to be doing well with regard to his wounds in general. He also has back on the vancomycin after the culture results and this seems to be doing excellent for him. All the wounds are showing signs of good improvement. No fevers, chills, nausea, vomiting, or diarrhea. 10/05/2020 upon evaluation today patient appears to actually be doing excellent in regard to his foot. Fortunately there is a lot of signs of new granulation epithelization at this point. There does not appear to be any evidence of active infection which is great news and overall very pleased with where things stand. In fact the open wound along the plantar aspect of his foot as well as the dorsal part of his foot has dramatically improved since we went forward with the vancomycin and requested that the dialysis center provide this to him by way of IV antibiotics with his dialysis. Overall I think that has made a dramatic improvement and overall his wound seems to be doing significantly better at this point. No fevers, chills, nausea, vomiting, or diarrhea. The patient was set to have his second opinion appointment with podiatry [facility] next week but to be perfectly honest I am not even certain that that is necessary at this point I do not think there is much that they would be able to do for him nor that he really would want them to do considering how well things look at this time. Objective Constitutional Well-nourished and well-hydrated in no acute distress. Vitals Time Taken: 12:44 PM, Height: 72 in, Weight: 225 lbs, BMI: 30.5, Temperature: 98.4 F, Pulse: 80 bpm, Respiratory Rate: 16 breaths/min, Blood Pressure: 122/83 mmHg. BRENNAN, FRATTINI S.  ([id number]) Respiratory normal breathing without difficulty. Psychiatric this patient is  able to make decisions and demonstrates good insight into disease process. Alert and Oriented x 3. pleasant and cooperative. General Notes: Upon inspection patient's wound bed actually showed signs of good granulation epithelization at this point. There does not appear to be any evidence of infection and again the dorsal/distal foot as well as the plantar foot are both completely healed. As far as the lateral foot proximal and distal they both are still open I did require some sharp debridement. The dorsal surface of the foot proximally also did require debridement. Everything else appears to be doing quite well. Integumentary (Hair, Skin) Wound #1 status is Open. Original cause of wound was Pressure Injury. The date acquired was: 02/20/2020. The wound has been in treatment 23 weeks. The wound is located on the Right,Distal,Lateral Foot. The wound measures 0.4cm length x 0.3cm width x 0.1cm depth; 0.094cm^2 area and 0.009cm^3 volume. There is Fat Layer (Subcutaneous Tissue) exposed. There is no tunneling or undermining noted. There is a medium amount of serosanguineous drainage noted. The wound margin is distinct with the outline attached to the wound base. There is large (67-100%) pink, pale granulation within the wound bed. There is no necrotic tissue within the wound bed. Wound #2 status is Healed - Epithelialized. Original cause of wound was Surgical Injury. The date acquired was: 04/20/2020. The wound has been in treatment 23 weeks. The wound is located on the Right,Distal,Dorsal Foot. The wound measures 0cm length x 0cm width x 0cm depth; 0cm^2 area and 0cm^3 volume. There is no tunneling or undermining noted. There is a none present amount of drainage noted. The wound margin is distinct with the outline attached to the wound base. There is no granulation within the wound bed. There is no necrotic tissue  within the wound bed. Wound #7 status is Open. Original cause of wound was Blister. The date acquired was: 09/05/2020. The wound has been in treatment 4 weeks. The wound is located on the Right,Dorsal Foot. The wound measures 0.9cm length x 0.4cm width x 0.1cm depth; 0.283cm^2 area and 0.028cm^3 volume. There is Fat Layer (Subcutaneous Tissue) exposed. There is no tunneling or undermining noted. There is a medium amount of sanguinous drainage noted. There is large (67-100%) red granulation within the wound bed. There is no necrotic tissue within the wound bed. Wound #8 status is Open. Original cause of wound was Gradually Appeared. The date acquired was: 09/07/2020. The wound has been in treatment 1 weeks. The wound is located on the Right,Proximal,Lateral Foot. The wound measures 0.3cm length x 0.4cm width x 0.2cm depth; 0.094cm^2 area and 0.019cm^3 volume. There is no tunneling or undermining noted. There is a none present amount of drainage noted. The wound margin is flat and intact. There is no granulation within the wound bed. There is a large (67-100%) amount of necrotic tissue within the wound bed including Eschar. 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 There was a Excisional Skin/Subcutaneous Tissue Debridement with a total area of 0.12 sq cm performed by Joaquim Lai,  Shirlee Latch., PA-C. With the following instrument(s):  Curette to remove Viable and Non-Viable tissue/material. Material removed includes Subcutaneous Tissue and Slough and after achieving pain control using Lidocaine 4% Topical Solution. A time out was conducted at 13:23, prior to the start of the procedure. A Minimum amount of bleeding was controlled with Pressure. The procedure was tolerated well. Post Debridement Measurements: 0.4cm length x 0.3cm width x 0.2cm depth; 0.019cm^3 volume. Post debridement Stage noted as Category/Stage IV. Character of Wound/Ulcer Post Debridement is stable. Post procedure Diagnosis Wound #1: Same as Pre-Procedure Wound #8 Pre-procedure diagnosis of Wound #8 is a Pressure Ulcer located on the Right,Proximal,Lateral Foot . There was a Excisional Skin/Subcutaneous Tissue Debridement with a total area of 0.12 sq cm performed by Tommie Sams., PA-C. With the following instrument(s): Curette to remove Viable and Non-Viable tissue/material. Material removed includes Subcutaneous Tissue and Slough and after achieving pain control using Lidocaine 4% Topical Solution. A time out was conducted at 13:23, prior to the start of the procedure. A Minimum amount of bleeding was VONTAE, KAPLOWITZ. ([id number]) controlled with Pressure. The procedure was tolerated well. Post Debridement Measurements: 0.3cm length x 0.4cm width x 0.2cm depth; 0.019cm^3 volume. Post debridement Stage noted as Unstageable/Unclassified. Character of Wound/Ulcer Post Debridement is stable. Post procedure Diagnosis Wound #8: Same as Pre-Procedure Plan Follow-up Appointments: Return Appointment in 1 week. Bathing/ Shower/ Hygiene: Clean wound with Normal Saline or wound cleanser. Off-Loading: Open toe surgical shoe with peg assist. - Right foot Additional Orders / Instructions: Other: - Cancel [facility] POD. appt WOUND #1: - Foot Wound Laterality: Right, Lateral, Distal Cleanser: Normal Saline (Generic) 3 x Per Week/30 Days Discharge Instructions:  Wash your hands with soap and water. Remove old dressing, discard into plastic bag and place into trash. Cleanse the wound with Normal Saline prior to applying a clean dressing using gauze sponges, not tissues or cotton balls. Do not scrub or use excessive force. Pat dry using gauze sponges, not tissue or cotton balls. Primary Dressing: Prisma 4.34 (in) 3 x Per Week/30 Days Discharge Instructions: Moisten w/normal saline or sterile water; Cover wound as directed. Do not remove from wound bed. Secondary Dressing: ABD Pad 5x9 (in/in) (Generic) 3 x Per Week/30 Days Discharge Instructions: Cover with ABD pad Secured With: 35M Medipore H Soft Cloth Surgical Tape, 2x2 (in/yd) (Generic) 3 x Per Week/30 Days Secured With: Conforming Stretch Gauze Bandage 4x75 (in/in) (Generic) 3 x Per Week/30 Days Discharge Instructions: Apply as directed WOUND #7: - Foot Wound Laterality: Dorsal, Right Cleanser: Normal Saline (Generic) 3 x Per Week/30 Days Discharge Instructions: Wash your hands with soap and water. Remove old dressing, discard into plastic bag and place into trash. Cleanse the wound with Normal Saline prior to applying a clean dressing using gauze sponges, not tissues or cotton balls. Do not scrub or use excessive force. Pat dry using gauze sponges, not tissue or cotton balls. Primary Dressing: Prisma 4.34 (in) 3 x Per Week/30 Days Discharge Instructions: Moisten w/normal saline or sterile water; Cover wound as directed. Do not remove from wound bed. Primary Dressing: Curad Oil Emulsion Dressing 3x3 (in/in) 3 x Per Week/30 Days Secondary Dressing: ABD Pad 5x9 (in/in) (Generic) 3 x Per Week/30 Days Discharge Instructions: Cover with ABD pad Secured With: 35M Medipore H Soft Cloth Surgical Tape, 2x2 (in/yd) (Generic) 3 x Per Week/30 Days Secured With: Conforming Stretch Gauze Bandage 4x75 (in/in) (Generic) 3 x Per Week/30 Days Discharge Instructions: Apply as directed WOUND #8: - Foot Wound  Laterality: Right, Lateral, Proximal Cleanser: Normal Saline (Generic) 3 x Per Week/30 Days Discharge Instructions: Wash your hands with soap and water. Remove old dressing, discard into plastic bag and place into trash. Cleanse the wound with Normal Saline prior to applying a clean dressing using gauze sponges, not tissues or cotton balls. Do not scrub or use excessive force. Pat dry using gauze sponges, not tissue or cotton balls. Primary Dressing: Prisma 4.34 (in) 3 x Per Week/30 Days Discharge Instructions: Moisten w/normal saline or sterile water; Cover wound as directed. Do not remove from wound bed. Primary Dressing: Curad Oil Emulsion Dressing 3x3 (in/in) 3 x Per Week/30 Days Secondary Dressing: ABD Pad 5x9 (in/in) (Generic) 3 x Per Week/30 Days Discharge Instructions: Cover with ABD pad Secured With: 56M Medipore H Soft Cloth Surgical Tape, 2x2 (in/yd) (Generic) 3 x Per Week/30 Days Secured With: Conforming Stretch Gauze Bandage 4x75 (in/in) (Generic) 3 x Per Week/30 Days Discharge Instructions: Apply as directed 1. I would recommend currently that we go ahead and switch back to the collagen as everything is draining significantly less now compared to previous. We will use just collagen over the distal foot laterally. On the proximal lateral foot as well as the proximal dorsal foot with any use the collagen followed by putting a small piece of oil emulsion dressing over top to help prevent this from drying out. 2. I am also can recommend the patient continue to monitor for any signs of worsening or infection or drainage especially in regard to the plantar aspect of his foot the right now it seems to be doing excellent. 3. I am also can recommend that he continue to take it easy use of the wheelchair mainly to get around and not using any type of significant pressure causing shoe that would possibly set things backtracking. He is in agreement with that plan is going to continue to be very  careful with this. We will see patient back for reevaluation in 1 week here in the clinic. If anything worsens or changes patient will contact our office for additional recommendations. Electronic Signature(s) Signed: 10/05/2020 1:45:40 PM By: Ferd Hibbs ([id number]) Entered By: Worthy Keeler on 10/05/2020 13:45:39 Ralph Peterson ([id number]) -------------------------------------------------------------------------------- SuperBill Details Patient Name: Ralph Peterson Date of Service: 10/05/2020 Medical Record Number: [id number] Patient Account Number: [id number] Date of Birth/Sex: [birth year] (55 y.o. M) Treating RN: Dolan Amen Primary Care Provider: Vidal Schwalbe Other Clinician: Jeanine Luz Referring Provider: Vidal Schwalbe Treating Provider/Extender: Skipper Cliche in Treatment: 23 Diagnosis Coding ICD-10 Codes Code Description [phone] Pressure ulcer of other site, stage 3 T81.31XA Disruption of external operation (surgical) wound, not elsewhere classified, initial encounter 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d L97.812 Non-pressure chronic ulcer of other part of right lower leg with fat layer exposed E11.621 Type 2 diabetes mellitus with foot ulcer I50.42 Chronic combined systolic (congestive) and diastolic (congestive) heart failure N18.6 End stage renal disease Z99.2 Dependence on renal dialysis Facility Procedures CPT4 Code: [id number] Description: 11042 - DEB SUBQ TISSUE 20 SQ CM/< Modifier: Quantity: 1 CPT4 Code: Description: ICD-10 Diagnosis Description L97.512 Non-pressure chronic ulcer of other part of right foot with fat layer ex L89.893 Pressure ulcer of other site, stage 3 Modifier: posed Quantity: Physician Procedures CPT4 [id number] Description: 11042 - WC PHYS SUBQ TISS 20 SQ CM Modifier: Quantity: 1 CPT4 Code: Description: ICD-10 Diagnosis  Description L97.512 Non-pressure chronic ulcer of other part of  right foot with fat layer ex L89.893 Pressure ulcer of other site, stage 3 Modifier: posed Quantity: Electronic Signature(s) Signed: 10/05/2020 1:45:51 PM By: Worthy Keeler PA-C Entered By: Worthy Keeler on 10/05/2020 13:45:50

## 2020-10-06 ENCOUNTER — Other Ambulatory Visit: Admission: RE | Admit: 2020-10-06 | Payer: Medicare PPO | Source: Ambulatory Visit

## 2020-10-06 NOTE — Progress Notes (Signed)
NARON, COBURN ([id number]) Visit Report for 10/05/2020 Arrival Information Details Patient Name: Ralph Peterson, Ralph Peterson. Date of Service: 10/05/2020 12:45 PM Medical Record Number: [id number] Patient Account Number: [id number] Date of Birth/Sex: 05/30/1966 (55 y.o. M) Treating RN: Cornell Barman Primary Care Zimere Dunlevy: Vidal Schwalbe Other Clinician: Jeanine Luz Referring Donold Marotto: Vidal Schwalbe Treating Kaedan Richert/Extender: Skipper Cliche in Treatment: 23 Visit Information History Since Last Visit Added or deleted any medications: No Patient Arrived: Wheel Chair Had a fall or experienced change in No Arrival Time: 12:50 activities of daily living that may affect Accompanied By: son risk of falls: Transfer Assistance: None Hospitalized since last visit: No Patient Requires Transmission-Based Precautions: No Has Dressing in Place as Prescribed: Yes Patient Has Alerts: No Pain Present Now: Yes Electronic Signature(s) Signed: 10/06/2020 5:20:30 PM By: Gretta Cool, BSN, RN, CWS, Kim RN, BSN Entered By: Gretta Cool, BSN, RN, CWS, Kim on 10/05/2020 12:50:57 Ralph Peterson ([id number]) -------------------------------------------------------------------------------- Clinic Level of Care Assessment Details Patient Name: Ralph Peterson. Date of Service: 10/05/2020 12:45 PM Medical Record Number: [id number] Patient Account Number: [id number] Date of Birth/Sex: 02/23/1966 (55 y.o. M) Treating RN: Dolan Amen Primary Care Subhan Hoopes: Vidal Schwalbe Other Clinician: Jeanine Luz Referring Vivia Rosenburg: Vidal Schwalbe Treating Roney Youtz/Extender: Skipper Cliche in Treatment: 23 Clinic Level of Care Assessment Items TOOL 1 Quantity Score '[]'$  - Use when EandM and Procedure is performed on INITIAL visit 0 ASSESSMENTS - Nursing Assessment / Reassessment '[]'$  - General Physical Exam (combine w/ comprehensive assessment (listed just below) when performed on new 0 pt. evals) '[]'$  -  0 Comprehensive Assessment (HX, ROS, Risk Assessments, Wounds Hx, etc.) ASSESSMENTS - Wound and Skin Assessment / Reassessment '[]'$  - Dermatologic / Skin Assessment (not related to wound area) 0 ASSESSMENTS - Ostomy and/or Continence Assessment and Care '[]'$  - Incontinence Assessment and Management 0 '[]'$  - 0 Ostomy Care Assessment and Management (repouching, etc.) PROCESS - Coordination of Care '[]'$  - Simple Patient / Family Education for ongoing care 0 '[]'$  - 0 Complex (extensive) Patient / Family Education for ongoing care '[]'$  - 0 Staff obtains Consents, Records, Test Results / Process Orders '[]'$  - 0 Staff telephones HHA, Nursing Homes / Clarify orders / etc '[]'$  - 0 Routine Transfer to another Facility (non-emergent condition) '[]'$  - 0 Routine Hospital Admission (non-emergent condition) '[]'$  - 0 New Admissions / Biomedical engineer / Ordering NPWT, Apligraf, etc. '[]'$  - 0 Emergency Hospital Admission (emergent condition) PROCESS - Special Needs '[]'$  - Pediatric / Minor Patient Management 0 '[]'$  - 0 Isolation Patient Management '[]'$  - 0 Hearing / Language / Visual special needs '[]'$  - 0 Assessment of Community assistance (transportation, D/C planning, etc.) '[]'$  - 0 Additional assistance / Altered mentation '[]'$  - 0 Support Surface(s) Assessment (bed, cushion, seat, etc.) INTERVENTIONS - Miscellaneous '[]'$  - External ear exam 0 '[]'$  - 0 Patient Transfer (multiple staff / Civil Service fast streamer / Similar devices) '[]'$  - 0 Simple Staple / Suture removal (25 or less) '[]'$  - 0 Complex Staple / Suture removal (26 or more) '[]'$  - 0 Hypo/Hyperglycemic Management (do not check if billed separately) '[]'$  - 0 Ankle / Brachial Index (ABI) - do not check if billed separately Has the patient been seen at the hospital within the last three years: Yes Total Score: 0 Level Of Care: ____ Ralph Peterson ([id number]) Electronic Signature(s) Signed: 10/05/2020 4:04:06 PM By: Georges Mouse, Minus Breeding RN Entered By: Georges Mouse, Kenia on 10/05/2020 13:44:35 Ralph Peterson ([id number]) -------------------------------------------------------------------------------- Encounter Discharge Information Details Patient Name: Ralph Peterson.  Date of Service: 10/05/2020 12:45 PM Medical Record Number: [id number] Patient Account Number: [id number] Date of Birth/Sex: 1965-11-18 (55 y.o. M) Treating RN: Dolan Amen Primary Care Bulah Lurie: Vidal Schwalbe Other Clinician: Jeanine Luz Referring Quintasia Theroux: Vidal Schwalbe Treating Esaw Knippel/Extender: Skipper Cliche in Treatment: 23 Encounter Discharge Information Items Post Procedure Vitals Discharge Condition: Stable Temperature (F): 98.4 Ambulatory Status: Wheelchair Pulse (bpm): 80 Discharge Destination: Home Respiratory Rate (breaths/min): 16 Transportation: Private Auto Blood Pressure (mmHg): 122/83 Accompanied By: son Schedule Follow-up Appointment: Yes Clinical Summary of Care: Electronic Signature(s) Signed: 10/05/2020 4:04:06 PM By: Georges Mouse, Minus Breeding RN Entered By: Georges Mouse, Kenia on 10/05/2020 13:46:05 Ralph Peterson ([id number]) -------------------------------------------------------------------------------- Lower Extremity Assessment Details Patient Name: Ralph Peterson Date of Service: 10/05/2020 12:45 PM Medical Record Number: [id number] Patient Account Number: [id number] Date of Birth/Sex: August 12, 1965 (55 y.o. M) Treating RN: Cornell Barman Primary Care Connor Foxworthy: Vidal Schwalbe Other Clinician: Jeanine Luz Referring Roberts Bon: Vidal Schwalbe Treating Jacinda Kanady/Extender: Skipper Cliche in Treatment: 23 Edema Assessment Assessed: [Left: No] [Right: No] Edema: [Left: N] [Right: o] Vascular Assessment Pulses: Dorsalis Pedis Palpable: [Right:Yes] Electronic Signature(s) Signed: 10/06/2020 5:20:30 PM By: Gretta Cool, BSN, RN, CWS, Kim RN, BSN Entered By: Gretta Cool, BSN, RN, CWS, Kim on 10/05/2020  13:01:53 Ralph Peterson ([id number]) -------------------------------------------------------------------------------- Multi Wound Chart Details Patient Name: Ralph Peterson Date of Service: 10/05/2020 12:45 PM Medical Record Number: [id number] Patient Account Number: [id number] Date of Birth/Sex: [birth year] (55 y.o. M) Treating RN: Dolan Amen Primary Care Gerren Hoffmeier: Vidal Schwalbe Other Clinician: Jeanine Luz Referring Travonta Gill: Vidal Schwalbe Treating Allaya Abbasi/Extender: Skipper Cliche in Treatment: 23 Vital Signs Height(in): 72 Pulse(bpm): 80 Weight(lbs): 225 Blood Pressure(mmHg): 122/83 Body Mass Index(BMI): 31 Temperature(F): 98.4 Respiratory Rate(breaths/min): 16 Photos: [1:No Photos] [2:No Photos] [7:No Photos] Wound Location: [1:Right, Distal, Lateral Foot] [2:Right, Distal, Dorsal Foot] [7:Right, Dorsal Foot] Wounding Event: [1:Pressure Injury] [2:Surgical Injury] [7:Blister] Primary Etiology: [1:Pressure Ulcer] [2:Open Surgical Wound] [7:Diabetic Wound/Ulcer of the Lower Extremity] Comorbid History: [1:Arrhythmia, Congestive Heart Failure, Coronary Artery Disease, Hypertension, Myocardial Infarction, Hypertension, Myocardial Infarction, Hypertension, Myocardial Infarction, Peripheral Venous Disease, Type II Peripheral Venous  Disease, Type II Peripheral Venous Disease, Type II Diabetes, Neuropathy] [2:Arrhythmia, Congestive Heart Failure, Coronary Artery Disease, Diabetes, Neuropathy] [7:Arrhythmia, Congestive Heart Failure, Coronary Artery Disease, Diabetes, Neuropathy] Date Acquired: [1:02/20/2020] [2:04/20/2020] [7:09/05/2020] Weeks of Treatment: [1:23] [2:23] [7:4] Wound Status: [1:Open] [2:Healed - Epithelialized] [7:Open] Measurements L x W x D (cm) [1:0.4x0.3x0.1] [2:0x0x0] [7:0.9x0.4x0.1] Area (cm) : [1:0.094] [2:0] [7:0.283] Volume (cm) : [1:0.009] [2:0] [7:0.028] % Reduction in Area: [1:98.10%] [2:100.00%] [7:98.40%] % Reduction in Volume:  [1:99.60%] [2:100.00%] [7:98.40%] Classification: [1:Category/Stage IV] [2:Full Thickness Without Exposed Support Structures] [7:Grade 2] Exudate Amount: [1:Medium] [2:None Present] [7:None Present] Exudate Type: [1:Serosanguineous] [2:N/A] [7:N/A] Exudate Color: [1:red, brown] [2:N/A] [7:N/A] Wound Margin: [1:Distinct, outline attached] [2:Distinct, outline attached] [7:N/A] Granulation Amount: [1:Large (67-100%)] [2:None Present (0%)] [7:None Present (0%)] Granulation Quality: [1:Pink, Pale] [2:N/A] [7:N/A] Necrotic Amount: [1:None Present (0%)] [2:None Present (0%)] [7:Large (67-100%)] Necrotic Tissue: [1:N/A] [2:N/A] [7:Eschar] Exposed Structures: [1:Fat Layer (Subcutaneous Tissue): Fascia: No Yes Fascia: No Tendon: No Muscle: No Joint: No Bone: No None] [2:Fat Layer (Subcutaneous Tissue): Yes No Tendon: No Muscle: No Joint: No Bone: No Large (67-100%)] [7:Fat Layer (Subcutaneous Tissue): Fascia:  No Tendon: No Muscle: No Joint: No Bone: No None] Wound Number: 8 N/A N/A Photos: No Photos N/A N/A Wound Location: Right, Proximal, Lateral Foot N/A N/A Wounding Event: Gradually Appeared N/A N/A Primary Etiology: Pressure Ulcer N/A N/A Comorbid History: Arrhythmia, Congestive Heart N/A N/A  Failure, Coronary Artery Disease, Hypertension, Myocardial Infarction, Peripheral Venous Disease, Type II Diabetes, Neuropathy Date Acquired: 09/07/2020 N/A N/A Weeks of Treatment: 1 N/A N/A Wound Status: Open N/A N/A Measurements L x W x D (cm) 0.3x0.4x0.2 N/A N/A Area (cm) : 0.094 N/A N/A Volume (cm) : 0.019 N/A N/A Beecher, Bernabe S. ([id number]) % Reduction in Area: 25.40% N/A N/A % Reduction in Volume: -46.20% N/A N/A Classification: Unstageable/Unclassified N/A N/A Exudate Amount: None Present N/A N/A Exudate Type: N/A N/A N/A Exudate Color: N/A N/A N/A Wound Margin: Flat and Intact N/A N/A Granulation Amount: None Present (0%) N/A N/A Granulation Quality: N/A N/A N/A Necrotic  Amount: Large (67-100%) N/A N/A Necrotic Tissue: Eschar N/A N/A Exposed Structures: Fascia: No N/A N/A Fat Layer (Subcutaneous Tissue): No Tendon: No Muscle: No Joint: No Bone: No Epithelialization: N/A N/A N/A Treatment Notes Electronic Signature(s) Signed: 10/05/2020 4:04:06 PM By: Georges Mouse, Minus Breeding RN Entered By: Georges Mouse, Minus Breeding on 10/05/2020 13:23:19 Ralph Peterson ([id number]) -------------------------------------------------------------------------------- Multi-Disciplinary Care Plan Details Patient Name: Ralph Peterson Date of Service: 10/05/2020 12:45 PM Medical Record Number: [id number] Patient Account Number: [id number] Date of Birth/Sex: [birth year] (55 y.o. M) Treating RN: Dolan Amen Primary Care Graysen Woodyard: Vidal Schwalbe Other Clinician: Jeanine Luz Referring Jailen Lung: Vidal Schwalbe Treating Athanasia Stanwood/Extender: Skipper Cliche in Treatment: 23 Active Inactive Necrotic Tissue Nursing Diagnoses: Impaired tissue integrity related to necrotic/devitalized tissue Knowledge deficit related to management of necrotic/devitalized tissue Goals: Necrotic/devitalized tissue will be minimized in the wound bed Date Initiated: 05/09/2020 Target Resolution Date: 05/09/2020 Goal Status: Active Interventions: Assess patient pain level pre-, during and post procedure and prior to discharge Provide education on necrotic tissue and debridement process Treatment Activities: Enzymatic debridement : 05/09/2020 Notes: Electronic Signature(s) Signed: 10/05/2020 4:04:06 PM By: Georges Mouse, Minus Breeding RN Entered By: Georges Mouse, Minus Breeding on 10/05/2020 13:23:12 Ralph Peterson ([id number]) -------------------------------------------------------------------------------- Pain Assessment Details Patient Name: Ralph Peterson. Date of Service: 10/05/2020 12:45 PM Medical Record Number: [id number] Patient Account Number: [id number] Date of  Birth/Sex: [birth year] (55 y.o. M) Treating RN: Cornell Barman Primary Care Mearl Olver: Vidal Schwalbe Other Clinician: Jeanine Luz Referring Catera Hankins: Vidal Schwalbe Treating Lyanna Blystone/Extender: Skipper Cliche in Treatment: 23 Active Problems Location of Pain Severity and Description of Pain Patient Has Paino Yes Site Locations Pain Location: Pain in Ulcers With Dressing Change: No Duration of the Pain. Constant / Intermittento Intermittent How Long Does it Lasto Hours: Minutes: 5 Rate the pain. Current Pain Level: 1 Character of Pain Describe the Pain: Dull Pain Management and Medication Current Pain Management: Electronic Signature(s) Signed: 10/06/2020 5:20:30 PM By: Gretta Cool, BSN, RN, CWS, Kim RN, BSN Entered By: Gretta Cool, BSN, RN, CWS, Kim on 10/05/2020 12:52:04 Ralph Peterson ([id number]) -------------------------------------------------------------------------------- Patient/Caregiver Education Details Patient Name: Ralph Peterson Date of Service: 10/05/2020 12:45 PM Medical Record Number: [id number] Patient Account Number: [id number] Date of Birth/Gender: 1966-05-29 (55 y.o. M) Treating RN: Dolan Amen Primary Care Physician: Vidal Schwalbe Other Clinician: Jeanine Luz Referring Physician: Vidal Schwalbe Treating Physician/Extender: Skipper Cliche in Treatment: 49 Education Assessment Education Provided To: Patient Education Topics Provided Wound/Skin Impairment: Methods: Explain/Verbal Responses: State content correctly Electronic Signature(s) Signed: 10/05/2020 4:04:06 PM By: Georges Mouse, Minus Breeding RN Entered By: Georges Mouse, Minus Breeding on 10/05/2020 13:45:16 Ralph Peterson ([id number]) -------------------------------------------------------------------------------- Wound Assessment Details Patient Name: Ralph Peterson Date of Service: 10/05/2020 12:45 PM Medical Record Number: [id number] Patient Account Number:  [id number] Date of Birth/Sex: 1965/08/22 (55 y.o. M) Treating RN: Cornell Barman Primary Care Treshaun Carrico: Vidal Schwalbe Other  Clinician: Jeanine Luz Referring Riki Gehring: Vidal Schwalbe Treating Codylee Patil/Extender: Skipper Cliche in Treatment: 23 Wound Status Wound Number: 1 Primary Pressure Ulcer Etiology: Wound Location: Right, Distal, Lateral Foot Wound Open Wounding Event: Pressure Injury Status: Date Acquired: 02/20/2020 Comorbid Arrhythmia, Congestive Heart Failure, Coronary Artery Weeks Of Treatment: 23 History: Disease, Hypertension, Myocardial Infarction, Peripheral Clustered Wound: No Venous Disease, Type II Diabetes, Neuropathy Photos Photo Uploaded By: Gretta Cool, BSN, RN, CWS, Kim on 10/05/2020 13:59:16 Wound Measurements Length: (cm) 0.4 Width: (cm) 0.3 Depth: (cm) 0.1 Area: (cm) 0.094 Volume: (cm) 0.009 % Reduction in Area: 98.1% % Reduction in Volume: 99.6% Epithelialization: None Tunneling: No Undermining: No Wound Description Classification: Category/Stage IV Wound Margin: Distinct, outline attached Exudate Amount: Medium Exudate Type: Serosanguineous Exudate Color: red, brown Foul Odor After Cleansing: No Slough/Fibrino Yes Wound Bed Granulation Amount: Large (67-100%) Exposed Structure Granulation Quality: Pink, Pale Fascia Exposed: No Necrotic Amount: None Present (0%) Fat Layer (Subcutaneous Tissue) Exposed: Yes Tendon Exposed: No Muscle Exposed: No Joint Exposed: No Bone Exposed: No Treatment Notes Wound #1 (Foot) Wound Laterality: Right, Lateral, Distal Cleanser Normal Saline Discharge Instruction: Wash your hands with soap and water. Remove old dressing, discard into plastic bag and place into trash. Cleanse the wound with Normal Saline prior to applying a clean dressing using gauze sponges, not tissues or cotton balls. Do not Schnee, Mahlik S. ([id number]) scrub or use excessive force. Pat dry using gauze sponges, not tissue or cotton  balls. Peri-Wound Care Topical Primary Dressing Prisma 4.34 (in) Discharge Instruction: Moisten w/normal saline or sterile water; Cover wound as directed. Do not remove from wound bed. Secondary Dressing ABD Pad 5x9 (in/in) Discharge Instruction: Cover with ABD pad Secured With 36M Medipore H Soft Cloth Surgical Tape, 2x2 (in/yd) Conforming Stretch Gauze Bandage 4x75 (in/in) Discharge Instruction: Apply as directed Compression Wrap Compression Stockings Add-Ons Electronic Signature(s) Signed: 10/06/2020 5:20:30 PM By: Gretta Cool, BSN, RN, CWS, Kim RN, BSN Entered By: Gretta Cool, BSN, RN, CWS, Kim on 10/05/2020 12:58:16 Ralph Peterson ([id number]) -------------------------------------------------------------------------------- Wound Assessment Details Patient Name: Ralph Peterson. Date of Service: 10/05/2020 12:45 PM Medical Record Number: [id number] Patient Account Number: [id number] Date of Birth/Sex: Nov 30, 1965 (55 y.o. M) Treating RN: Cornell Barman Primary Care Gladyse Corvin: Vidal Schwalbe Other Clinician: Jeanine Luz Referring Amali Uhls: Vidal Schwalbe Treating Aryonna Gunnerson/Extender: Skipper Cliche in Treatment: 23 Wound Status Wound Number: 2 Primary Open Surgical Wound Etiology: Wound Location: Right, Distal, Dorsal Foot Wound Healed - Epithelialized Wounding Event: Surgical Injury Status: Date Acquired: 04/20/2020 Comorbid Arrhythmia, Congestive Heart Failure, Coronary Artery Weeks Of Treatment: 23 History: Disease, Hypertension, Myocardial Infarction, Peripheral Clustered Wound: No Venous Disease, Type II Diabetes, Neuropathy Photos Photo Uploaded By: Gretta Cool, BSN, RN, CWS, Kim on 10/05/2020 13:59:16 Wound Measurements Length: (cm) 0 Width: (cm) 0 Depth: (cm) 0 Area: (cm) 0 Volume: (cm) 0 % Reduction in Area: 100% % Reduction in Volume: 100% Epithelialization: Large (67-100%) Tunneling: No Undermining: No Wound Description Classification: Full Thickness  Without Exposed Support Structures Wound Margin: Distinct, outline attached Exudate Amount: None Present Foul Odor After Cleansing: No Slough/Fibrino No Wound Bed Granulation Amount: None Present (0%) Exposed Structure Necrotic Amount: None Present (0%) Fascia Exposed: No Fat Layer (Subcutaneous Tissue) Exposed: No Tendon Exposed: No Muscle Exposed: No Joint Exposed: No Bone Exposed: No Electronic Signature(s) Signed: 10/05/2020 4:04:06 PM By: Charlett Nose RN Signed: 10/06/2020 5:20:30 PM By: Gretta Cool, BSN, RN, CWS, Kim RN, BSN Entered By: Georges Mouse, Kenia on 10/05/2020 13:22:24 Ralph Peterson ([id number]) -------------------------------------------------------------------------------- Wound Assessment Details Patient  Name: JAD, MAMONE. Date of Service: 10/05/2020 12:45 PM Medical Record Number: [id number] Patient Account Number: [id number] Date of Birth/Sex: [birth year] (55 y.o. M) Treating RN: Cornell Barman Primary Care Anaiya Wisinski: Vidal Schwalbe Other Clinician: Jeanine Luz Referring Horace Wishon: Vidal Schwalbe Treating Kaija Kovacevic/Extender: Skipper Cliche in Treatment: 23 Wound Status Wound Number: 7 Primary Diabetic Wound/Ulcer of the Lower Extremity Etiology: Wound Location: Right, Dorsal Foot Wound Open Wounding Event: Blister Status: Date Acquired: 09/05/2020 Comorbid Arrhythmia, Congestive Heart Failure, Coronary Artery Weeks Of Treatment: 4 History: Disease, Hypertension, Myocardial Infarction, Peripheral Clustered Wound: No Venous Disease, Type II Diabetes, Neuropathy Photos Wound Measurements Length: (cm) 0.9 Width: (cm) 0.4 Depth: (cm) 0.1 Area: (cm) 0.283 Volume: (cm) 0.028 % Reduction in Area: 98.4% % Reduction in Volume: 98.4% Epithelialization: Small (1-33%) Tunneling: No Undermining: No Wound Description Classification: Grade 2 Exudate Amount: Medium Exudate Type: Sanguinous Exudate Color: red Foul Odor After  Cleansing: No Slough/Fibrino No Wound Bed Granulation Amount: Large (67-100%) Exposed Structure Granulation Quality: Red Fascia Exposed: No Necrotic Amount: None Present (0%) Fat Layer (Subcutaneous Tissue) Exposed: Yes Tendon Exposed: No Muscle Exposed: No Joint Exposed: No Bone Exposed: No Treatment Notes Wound #7 (Foot) Wound Laterality: Dorsal, Right Cleanser Normal Saline Discharge Instruction: Wash your hands with soap and water. Remove old dressing, discard into plastic bag and place into trash. Cleanse the wound with Normal Saline prior to applying a clean dressing using gauze sponges, not tissues or cotton balls. Do not scrub or use excessive force. Pat dry using gauze sponges, not tissue or cotton balls. COLBURN, [name] ([id number]) Peri-Wound Care Topical Primary Dressing Prisma 4.34 (in) Discharge Instruction: Moisten w/normal saline or sterile water; Cover wound as directed. Do not remove from wound bed. Curad Oil Emulsion Dressing 3x3 (in/in) Secondary Dressing ABD Pad 5x9 (in/in) Discharge Instruction: Cover with ABD pad Secured With 12M Medipore H Soft Cloth Surgical Tape, 2x2 (in/yd) Conforming Stretch Gauze Bandage 4x75 (in/in) Discharge Instruction: Apply as directed Compression Wrap Compression Stockings Add-Ons Electronic Signature(s) Signed: 10/05/2020 4:04:06 PM By: Charlett Nose RN Signed: 10/06/2020 5:20:30 PM By: Gretta Cool, BSN, RN, CWS, Kim RN, BSN Entered By: Georges Mouse, Kenia on 10/05/2020 13:26:29 Ralph Peterson ([id number]) -------------------------------------------------------------------------------- Wound Assessment Details Patient Name: Ralph Peterson. Date of Service: 10/05/2020 12:45 PM Medical Record Number: [id number] Patient Account Number: [id number] Date of Birth/Sex: 1966-06-13 (55 y.o. M) Treating RN: Cornell Barman Primary Care Abdon Petrosky: Vidal Schwalbe Other Clinician: Jeanine Luz Referring  Olanrewaju Osborn: Vidal Schwalbe Treating Taiten Brawn/Extender: Skipper Cliche in Treatment: 23 Wound Status Wound Number: 8 Primary Pressure Ulcer Etiology: Wound Location: Right, Proximal, Lateral Foot Wound Open Wounding Event: Gradually Appeared Status: Date Acquired: 09/07/2020 Comorbid Arrhythmia, Congestive Heart Failure, Coronary Artery Weeks Of Treatment: 1 History: Disease, Hypertension, Myocardial Infarction, Peripheral Clustered Wound: No Venous Disease, Type II Diabetes, Neuropathy Photos Photo Uploaded By: Gretta Cool, BSN, RN, CWS, Kim on 10/05/2020 14:00:01 Wound Measurements Length: (cm) 0.3 Width: (cm) 0.4 Depth: (cm) 0.2 Area: (cm) 0.094 Volume: (cm) 0.019 % Reduction in Area: 25.4% % Reduction in Volume: -46.2% Tunneling: No Undermining: No Wound Description Classification: Unstageable/Unclassified Wound Margin: Flat and Intact Exudate Amount: None Present Foul Odor After Cleansing: No Slough/Fibrino No Wound Bed Granulation Amount: None Present (0%) Exposed Structure Necrotic Amount: Large (67-100%) Fascia Exposed: No Necrotic Quality: Eschar Fat Layer (Subcutaneous Tissue) Exposed: No Tendon Exposed: No Muscle Exposed: No Joint Exposed: No Bone Exposed: No Treatment Notes Wound #8 (Foot) Wound Laterality: Right, Lateral, Proximal Cleanser Normal Saline Discharge Instruction: Wash your  hands with soap and water. Remove old dressing, discard into plastic bag and place into trash. Cleanse the wound with Normal Saline prior to applying a clean dressing using gauze sponges, not tissues or cotton balls. Do not scrub or use excessive force. Pat dry using gauze sponges, not tissue or cotton balls. DEONDRA, CARLON ([id number]) Peri-Wound Care Topical Primary Dressing Prisma 4.34 (in) Discharge Instruction: Moisten w/normal saline or sterile water; Cover wound as directed. Do not remove from wound bed. Curad Oil Emulsion Dressing 3x3 (in/in) Secondary  Dressing ABD Pad 5x9 (in/in) Discharge Instruction: Cover with ABD pad Secured With 93M Medipore H Soft Cloth Surgical Tape, 2x2 (in/yd) Conforming Stretch Gauze Bandage 4x75 (in/in) Discharge Instruction: Apply as directed Compression Wrap Compression Stockings Add-Ons Electronic Signature(s) Signed: 10/06/2020 5:20:30 PM By: Gretta Cool, BSN, RN, CWS, Kim RN, BSN Entered By: Gretta Cool, BSN, RN, CWS, Kim on 10/05/2020 13:01:30 Ralph Peterson ([id number]) -------------------------------------------------------------------------------- [place] Details Patient Name: Ralph Peterson. Date of Service: 10/05/2020 12:45 PM Medical Record Number: [id number] Patient Account Number: [id number] Date of Birth/Sex: 21-Nov-1965 (55 y.o. M) Treating RN: Cornell Barman Primary Care Miski Feldpausch: Vidal Schwalbe Other Clinician: Jeanine Luz Referring Hamdi Kley: Vidal Schwalbe Treating Oluwaseyi Raffel/Extender: Skipper Cliche in Treatment: 23 Vital Signs Time Taken: 12:44 Temperature (F): 98.4 Height (in): 72 Pulse (bpm): 80 Weight (lbs): 225 Respiratory Rate (breaths/min): 16 Body Mass Index (BMI): 30.5 Blood Pressure (mmHg): 122/83 Reference Range: 80 - 120 mg / dl Electronic Signature(s) Signed: 10/06/2020 5:20:30 PM By: Gretta Cool, BSN, RN, CWS, Kim RN, BSN Entered By: Gretta Cool, BSN, RN, CWS, Kim on 10/05/2020 12:51:22

## 2020-10-10 ENCOUNTER — Other Ambulatory Visit (INDEPENDENT_AMBULATORY_CARE_PROVIDER_SITE_OTHER): Payer: Self-pay | Admitting: Nurse Practitioner

## 2020-10-10 DIAGNOSIS — L97909 Non-pressure chronic ulcer of unspecified part of unspecified lower leg with unspecified severity: Secondary | ICD-10-CM

## 2020-10-12 ENCOUNTER — Encounter: Payer: Medicare PPO | Admitting: Physician Assistant

## 2020-10-12 ENCOUNTER — Other Ambulatory Visit: Payer: Self-pay

## 2020-10-12 DIAGNOSIS — T8781 Dehiscence of amputation stump: Secondary | ICD-10-CM | POA: Diagnosis not present

## 2020-10-12 NOTE — Progress Notes (Addendum)
[name], [name] Visit Report for 10/12/2020 Chief Complaint Document Details Patient Name: Ralph Peterson, PENDERS. Date of Service: 10/12/2020 2:45 PM Medical Record Number: [id number] Patient Account Number: [id number] Date of Birth/Sex: 1966-06-19 (55 y.o. 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 M) Treating RN: Dolan Amen Primary Care Provider: Vidal Schwalbe Other Clinician: Referring Provider: Vidal Schwalbe Treating Provider/Extender: Skipper Cliche in Treatment: 24 Debridement Performed for Wound #1 Right,Distal,Lateral Foot Assessment: Performed By: Physician Tommie Sams., PA-C Debridement Type: Debridement Level of Consciousness (Pre- Awake and Alert procedure): Pre-procedure Verification/Time Out Yes - 15:40 Taken: Start Time: 15:40 Total Area Debrided (L x W): 0.7 (cm) x 1 (cm) = 0.7 (cm) Tissue and other material Viable, Non-Viable, Slough, Subcutaneous, Slough debrided: Level: Skin/Subcutaneous Tissue Debridement Description: Excisional Instrument: Curette Bleeding: Minimum Hemostasis Achieved: Pressure Response to Treatment: Procedure was tolerated well Level of Consciousness (Post- Awake and Alert procedure): Post  Debridement Measurements of Total Wound Length: (cm) 0.7 Stage: Category/Stage IV Width: (cm) 1 Depth: (cm) 0.2 Volume: (cm) 0.11 Character of Wound/Ulcer Post Debridement: Stable Post Procedure Diagnosis Same as Pre-procedure Electronic Signature(s) Signed: 10/12/2020 4:40:53 PM By: Charlett Nose RN Signed: 10/12/2020 6:00:52 PM By: Worthy Keeler PA-C Entered By: Georges Mouse, Minus Breeding on 10/12/2020 15:44:23 Ralph Peterson ([id number]) -------------------------------------------------------------------------------- Debridement Details Patient Name: Ralph Peterson. Date of Service: 10/12/2020 2:45 PM Medical Record Number: [id number] Patient Account Number: [id number] Date of Birth/Sex: 29-Feb-1966 (55 y.o. M) Treating RN: Dolan Amen Primary Care Provider: Vidal Schwalbe Other Clinician: Referring Provider: Vidal Schwalbe Treating Provider/Extender: Skipper Cliche in Treatment: 24 Debridement Performed for Wound #8 Right,Proximal,Lateral Foot Assessment: Performed By: Physician Tommie Sams., PA-C Debridement Type: Debridement Level of Consciousness (Pre- Awake and Alert procedure): Pre-procedure Verification/Time Out Yes - 15:40 Taken: Start Time: 15:40 Total Area Debrided (L x W): 0.5 (cm) x 0.5 (cm) = 0.25 (cm) Tissue and other material Viable, Non-Viable, Slough, Subcutaneous, Slough debrided: Level: Skin/Subcutaneous Tissue Debridement Description: Excisional Instrument: Curette Bleeding: Minimum Hemostasis Achieved: Pressure Response to Treatment: Procedure was tolerated well Level of Consciousness (Post- Awake and Alert procedure): Post Debridement Measurements of Total Wound Length: (cm) 0.5 Stage: Unstageable/Unclassified Width: (cm) 0.5 Depth: (cm) 0.2 Volume: (cm) 0.039 Character of Wound/Ulcer Post Debridement: Stable Post Procedure Diagnosis Same as Pre-procedure Electronic Signature(s) Signed: 10/12/2020 4:40:53 PM  By: Charlett Nose RN Signed: 10/12/2020 6:00:52 PM By: Worthy Keeler PA-C Entered By: Georges Mouse, Minus Breeding on 10/12/2020 15:44:52 Ralph Peterson ([id number]) -------------------------------------------------------------------------------- HPI Details Patient Name: Ralph Peterson. Date of Service: 10/12/2020 2:45 PM Medical Record Number: [id number] Patient Account Number: [id number] Date of Birth/Sex: October 31, 1965 (55 y.o. M) Treating RN: Dolan Amen Primary Care Provider: Vidal Schwalbe Other Clinician: Referring Provider: Vidal Schwalbe Treating Provider/Extender: Skipper Cliche in Treatment: 24 History of Present Illness HPI Description: 08/17/2020 upon evaluation today patient actually appears to be doing decently well today which is good news in regard to the dorsal wound which is almost healed and  the lateral wound also doing much better. With regard to the plantar wound this is still draining purulent drainage unfortunately the culture did reveal that he had Pseudomonas as well as Enterococcus. With that being said he is allergic to penicillin which took away the only oral medication for the Enterococcus I did look at linezolid as a possibility but unfortunately there were medication interactions. Also the same was true the Cipro where there were medication interactions. Nonetheless I want to see if we can get him into infectious disease to see if they can work with him to try to find possibly add at home IV antibiotic regimen that can help out at this point. No sharp debridement is can be necessary today. 09/07/2020 upon evaluation today patient appears to be doing unfortunately not too well in regard to his wound. He has been tolerating the dressing changes without complication. Fortunately there is no sign of active infection at this time. No fevers, chills, nausea, vomiting, or diarrhea. The patient did have a repeat surgery going to clean out the abscess  and try to work on getting things moving in a better direction about a week and a half ago. He still has sutures in place. With that being said unfortunately he does not appear to be showing signs of a lot of improvement in my opinion. He does have sutures on the plantar aspect still in place and on the dorsal aspect he has a small opening which actually probes all the way down through and actually came out of the plantar aspect. I feel like that he really is having a lot of issues here and I am afraid that if we do not get this taken care of this is can end up with him having a much larger and more extensive amputation than what he was hoping to have to deal with with this. It has already been since June 2021 but has been dealing with this including all the time since the amputation. 09/14/2020 upon evaluation today patient appears to be doing really about the same in regard to the wound on the dorsal surface of his foot. The plantar foot unfortunately still has sutures in place I really cannot see how things are doing in that regard. With the dorsal foot however this appears to show signs of still having a small opening in the distal aspect which does actually probe down to bone there is also significant purulent drainage we did actually obtain a wound culture today to further evaluate the issue here. 09/21/2020 upon evaluation today patient appears to be doing about the same in regard to his wound on the dorsal surface of his foot and the plantar foot he still does not have the sutures out at this point. That should hopefully happen in the next week. With that being said the main issue is he continues to have drainage here. It was noted that he had positive findings on culture for Enterobacter. With that being said he cannot take oral penicillin he could do vancomycin and I think that is good to be the ideal thing this to see if we can get him started on IV vancomycin at dialysis. We will get a get in  touch with them to try to see if we can coordinate that being done there at the dialysis center. I think that is the ideal situation at this point I would probably start with just 2 weeks of therapy. 09/28/2020 upon evaluation today patient appears to be doing well with  regard to his wounds in general. He also has back on the vancomycin after the culture results and this seems to be doing excellent for him. All the wounds are showing signs of good improvement. No fevers, chills, nausea, vomiting, or diarrhea. 10/05/2020 upon evaluation today patient appears to actually be doing excellent in regard to his foot. Fortunately there is a lot of signs of new granulation epithelization at this point. There does not appear to be any evidence of active infection which is great news and overall very pleased with where things stand. In fact the open wound along the plantar aspect of his foot as well as the dorsal part of his foot has dramatically improved since we went forward with the vancomycin and requested that the dialysis center provide this to him by way of IV antibiotics with his dialysis. Overall I think that has made a dramatic improvement and overall his wound seems to be doing significantly better at this point. No fevers, chills, nausea, vomiting, or diarrhea. The patient was set to have his second opinion appointment with podiatry [facility] next week but to be perfectly honest I am not even certain that that is necessary at this point I do not think there is much that they would be able to do for him nor that he really would want them to do considering how well things look at this time. 10/12/2020 on evaluation today patient appears to be doing excellent in regard to his wounds. In fact the only issue I see is that everything is dried out as far as the collagen is concerned. I do think we can try to keep this moist he will continue to make excellent progress as he has been. Overall I am extremely pleased  with what I see today. Electronic Signature(s) Signed: 10/12/2020 3:51:34 PM By: Worthy Keeler PA-C Entered By: Worthy Keeler on 10/12/2020 15:51:34 Ralph Peterson ([id number]) -------------------------------------------------------------------------------- Physical Exam Details Patient Name: Ralph Peterson Date of Service: 10/12/2020 2:45 PM Medical Record Number: [id number] Patient Account Number: [id number] Date of Birth/Sex: [birth year] (55 y.o. M) Treating RN: Dolan Amen Primary Care Provider: Vidal Schwalbe Other Clinician: Referring Provider: Vidal Schwalbe Treating Provider/Extender: Skipper Cliche in Treatment: 86 Constitutional Well-nourished and well-hydrated in no acute distress. Respiratory normal breathing without difficulty. Psychiatric this patient is able to make decisions and demonstrates good insight into disease process. Alert and Oriented x 3. pleasant and cooperative. Notes Patient's wound bed actually showed signs of good granulation epithelization at this point. There does not appear to be any signs of infection I do not perform sharp debridement to relieve the slough from the surface of the wound and the patient tolerated that today without complication. Post debridement patient's wound bed showed signs of good granulation epithelization which is excellent. Electronic Signature(s) Signed: 10/12/2020 3:52:05 PM By: Worthy Keeler PA-C Entered By: Worthy Keeler on 10/12/2020 15:52:05 Ralph Peterson ([id number]) -------------------------------------------------------------------------------- Physician Orders Details Patient Name: Ralph Peterson Date of Service: 10/12/2020 2:45 PM Medical Record Number: [id number] Patient Account Number: [id number] Date of Birth/Sex: 09/14/1965 (55 y.o. 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 Bathing/ Shower/ Hygiene o Clean wound with Normal Saline or wound cleanser. Off-Loading o Open toe surgical shoe with peg assist. - Right foot Wound Treatment Wound #1 - Foot Wound Laterality: Right, Lateral, Distal Cleanser: Normal Saline (Generic) 3 x Per Week/30 Days Discharge Instructions: Wash your hands with soap and water. Remove old dressing, discard into plastic bag and place into trash. Cleanse the wound with Normal Saline prior to applying a clean dressing using gauze sponges, not tissues or cotton balls. Do not scrub or use excessive force. Pat dry using gauze sponges, not tissue or cotton balls. Primary Dressing: Prisma 4.34 (in) 3 x Per Week/30 Days Discharge Instructions: Moisten with hydrogel Secondary Dressing: ABD Pad 5x9 (in/in) (Generic) 3 x Per Week/30 Days Discharge Instructions: Cover with ABD pad Secured With: 70M Medipore H Soft Cloth Surgical Tape, 2x2 (in/yd) (Generic) 3 x Per Week/30 Days Discharge Instructions: Secure gauze bandage Secured With: Conforming Stretch Gauze Bandage 4x75 (in/in) (Generic) 3 x Per Week/30 Days Discharge Instructions: Apply as directed, wrap around ankle to anchor Wound #7 - Foot Wound Laterality: Dorsal, Right Cleanser:  Normal Saline (Generic) 3 x Per Week/30 Days Discharge Instructions: Wash your hands with soap and water. Remove old dressing, discard into plastic bag and place into trash. Cleanse the wound with Normal Saline prior to applying a clean dressing using gauze sponges, not tissues or cotton balls. Do not scrub or use excessive force. Pat dry using gauze sponges, not tissue or cotton balls. Primary Dressing: Prisma 4.34 (in) 3 x Per Week/30 Days Discharge Instructions: Moisten with hydrogel Secondary Dressing: ABD Pad 5x9 (in/in) (Generic) 3 x Per Week/30 Days Discharge Instructions: Cover with ABD pad Secured With: 70M Medipore H Soft Cloth Surgical Tape, 2x2 (in/yd) (Generic) 3 x Per Week/30 Days Ralph Peterson, Ralph Peterson ([id number]) Discharge Instructions: Secure gauze bandage Secured With: Conforming Stretch Gauze Bandage 4x75 (in/in) (Generic) 3 x Per Week/30 Days Discharge Instructions: Apply as directed, wrap around ankle to anchor Wound #8 - Foot Wound Laterality: Right, Lateral, Proximal Cleanser: Normal Saline (Generic) 3 x Per Week/30 Days Discharge Instructions: Wash your hands with soap and water. Remove old dressing, discard into plastic bag and place into trash. Cleanse the wound with Normal Saline prior to applying a clean dressing using gauze sponges, not tissues or cotton balls. Do not scrub or use excessive force. Pat dry using gauze sponges, not tissue or cotton balls. Primary Dressing: Prisma 4.34 (in) 3 x Per Week/30 Days Discharge Instructions: Moisten with hydrogel Secondary Dressing: ABD Pad 5x9 (in/in) (Generic) 3 x Per Week/30 Days Discharge Instructions: Cover with ABD pad Secured With: 70M Medipore H Soft Cloth Surgical Tape, 2x2 (in/yd) (Generic) 3 x Per Week/30 Days Discharge Instructions: Secure gauze bandage Secured With: Conforming Stretch Gauze Bandage 4x75 (in/in) (Generic) 3 x Per Week/30 Days Discharge Instructions: Apply as directed, wrap around ankle to  anchor Electronic Signature(s) Signed: 10/12/2020 4:40:53 PM By: Georges Mouse, Minus Breeding RN Signed: 10/12/2020 6:00:52 PM By: Worthy Keeler PA-C Entered By: Georges Mouse, Minus Breeding on 10/12/2020 15:47:16 Ralph Peterson ([id number]) -------------------------------------------------------------------------------- Problem List Details Patient Name: Ralph Peterson. Date of Service: 10/12/2020 2:45 PM Medical Record Number: [id number] Patient Account Number: [id number]  Date of Birth/Sex: 05/05/1966 (55 y.o. M) Treating RN: Dolan Amen Primary Care Provider: Vidal Schwalbe Other Clinician: Referring Provider: Vidal Schwalbe Treating Provider/Extender: Skipper Cliche in Treatment: 24 Active Problems ICD-10 Encounter Code Description Active Date MDM Diagnosis L89.893 Pressure ulcer of other site, stage 3 04/27/2020 No Yes T81.31XA Disruption of external operation (surgical) wound, not elsewhere 04/27/2020 No Yes classified, initial encounter L97.512 Non-pressure chronic ulcer of other part of right foot with fat layer 04/27/2020 No Yes exposed L97.812 Non-pressure chronic ulcer of other part of right lower leg with fat layer 04/27/2020 No Yes exposed E11.621 Type 2 diabetes mellitus with foot ulcer 04/27/2020 No Yes I50.42 Chronic combined systolic (congestive) and diastolic (congestive) heart 04/27/2020 No Yes failure N18.6 End stage renal disease 04/27/2020 No Yes Z99.2 Dependence on renal dialysis 04/27/2020 No Yes Inactive Problems Resolved Problems Electronic Signature(s) Signed: 10/12/2020 3:37:44 PM By: Worthy Keeler PA-C Entered By: Worthy Keeler on 10/12/2020 15:37:43 Ralph Peterson ([id number]) -------------------------------------------------------------------------------- Progress Note Details Patient Name: Ralph Peterson Date of Service: 10/12/2020 2:45 PM Medical Record Number: [id number] Patient Account Number: [id number] Date of Birth/Sex:  Dec 28, 1965 (55 y.o. 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 With regard to the plantar wound this is still draining purulent drainage unfortunately the culture did reveal that he had Pseudomonas as well as Enterococcus. With that being said he is allergic to penicillin which took away the only oral medication for the Enterococcus I did look at linezolid as a possibility but unfortunately there were medication interactions. Also the same was true the Cipro where there were medication interactions. Nonetheless I want to see if we can get him into infectious disease to see if they can work with him to try to find possibly add at home IV antibiotic regimen that can help out at this point. No sharp debridement is can be necessary today. 09/07/2020 upon evaluation today patient appears to be doing unfortunately not too well in regard to his wound. He has been tolerating the dressing changes without complication. Fortunately there is no sign of active infection at this time. No fevers, chills, nausea, vomiting, or diarrhea. The patient did have a repeat surgery going to clean out the abscess and try to work on getting things moving in a better direction about a week and a half ago. He still has sutures in place. With that being said unfortunately he does not appear to be showing signs of a lot of improvement in my opinion. He does have sutures on the plantar aspect still in place and  on the dorsal aspect he has a small opening which actually probes all the way down through and actually came out of the plantar aspect. I feel like that he really is having a lot of issues here and I am afraid that if we do not get this taken care of this is can end up with him having a much larger and more extensive amputation than what he was hoping to have to deal with with this. It has already been since  June 2021 but has been dealing with this including all the time since the amputation. 09/14/2020 upon evaluation today patient appears to be doing really about the same in regard to the wound on the dorsal surface of his foot. The plantar foot unfortunately still has sutures in place I really cannot see how things are doing in that regard. With the dorsal foot however this appears to show signs of still having a small opening in the distal aspect which does actually probe down to bone there is also significant purulent drainage we did actually obtain a wound culture today to further evaluate the issue here. 09/21/2020 upon evaluation today patient appears to be doing about the same in regard to his wound on the dorsal surface of his foot and the plantar foot he still does not have the sutures out at this point. That should hopefully happen in the next week. With that being said the main issue is he continues to have drainage here. It was noted that he had positive findings on culture for Enterobacter. With that being said he cannot take oral penicillin he could do vancomycin and I think that is good to be the ideal thing this to see if we can get him started on IV vancomycin at dialysis. We will get a get in touch with them to try to see if we can coordinate that being done there at the dialysis center. I think that is the ideal situation at this point I would probably start with just 2 weeks of therapy. 09/28/2020 upon evaluation today patient appears to be doing well with regard to his wounds in  general. He also has back on the vancomycin after the culture results and this seems to be doing excellent for him. All the wounds are showing signs of good improvement. No fevers, chills, nausea, vomiting, or diarrhea. 10/05/2020 upon evaluation today patient appears to actually be doing excellent in regard to his foot. Fortunately there is a lot of signs of new granulation epithelization at this point. There does not appear to be any evidence of active infection which is great news and overall very pleased with where things stand. In fact the open wound along the plantar aspect of his foot as well as the dorsal part of his foot has dramatically improved since we went forward with the vancomycin and requested that the dialysis center provide this to him by way of IV antibiotics with his dialysis. Overall I think that has made a dramatic improvement and overall his wound seems to be doing significantly better at this point. No fevers, chills, nausea, vomiting, or diarrhea. The patient was set to have his second opinion appointment with podiatry [facility] next week but to be perfectly honest I am not even certain that that is necessary at this point I do not think there is much that they would be able to do for him nor that he really would want them to do considering how well things look at this time. 10/12/2020 on evaluation today patient appears to be doing excellent in regard to his wounds. In fact the only issue I see is that everything is dried out as far as the collagen is concerned. I do think we can try to keep this moist he will continue to make excellent progress as he has been. Overall I am extremely pleased with what I see today. Objective Constitutional Well-nourished and well-hydrated in no acute distress. Ralph Peterson, Ralph Peterson ([id number]) Vitals Time Taken: 3:08 PM, Height: 72 in,  Weight: 225 lbs, BMI: 30.5, Temperature: 98.7 F, Pulse: 83 bpm, Respiratory Rate: 18 breaths/min, Blood  Pressure: 122/80 mmHg. Respiratory normal breathing without difficulty. Psychiatric this patient is able to make decisions and demonstrates good insight into disease process. Alert and Oriented x 3. pleasant and cooperative. General Notes: Patient's wound bed actually showed signs of good granulation epithelization at this point. There does not appear to be any signs of infection I do not perform sharp debridement to relieve the slough from the surface of the wound and the patient tolerated that today without complication. Post debridement patient's wound bed showed signs of good granulation epithelization which is excellent. Integumentary (Hair, Skin) Wound #1 status is Open. Original cause of wound was Pressure Injury. The date acquired was: 02/20/2020. The wound has been in treatment 24 weeks. The wound is located on the Right,Distal,Lateral Foot. The wound measures 0.7cm length x 1cm width x 0.2cm depth; 0.55cm^2 area and 0.11cm^3 volume. There is Fat Layer (Subcutaneous Tissue) exposed. There is no tunneling or undermining noted. There is a medium amount of serosanguineous drainage noted. The wound margin is distinct with the outline attached to the wound base. There is large (67-100%) pink, pale granulation within the wound bed. There is no necrotic tissue within the wound bed. Wound #7 status is Open. Original cause of wound was Blister. The date acquired was: 09/05/2020. The wound has been in treatment 5 weeks. The wound is located on the Right,Dorsal Foot. The wound measures 0.8cm length x 0.4cm width x 0.1cm depth; 0.251cm^2 area and 0.025cm^3 volume. There is Fat Layer (Subcutaneous Tissue) exposed. There is no tunneling or undermining noted. There is a medium amount of sanguinous drainage noted. The wound margin is flat and intact. There is no granulation within the wound bed. There is a large (67-100%) amount of necrotic tissue within the wound bed including Eschar. Wound #8 status is  Open. Original cause of wound was Gradually Appeared. The date acquired was: 09/07/2020. The wound has been in treatment 2 weeks. The wound is located on the Right,Proximal,Lateral Foot. The wound measures 0.5cm length x 0.5cm width x 0.2cm depth; 0.196cm^2 area and 0.039cm^3 volume. There is a none present amount of drainage noted. The wound margin is flat and intact. There is no granulation within the wound bed. There is a large (67-100%) amount of necrotic tissue within the wound bed including Eschar. 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 There was a Excisional Skin/Subcutaneous Tissue Debridement with a total area of 0.7 sq cm performed by Tommie Sams., PA-C. With the following instrument(s): Curette to remove Viable and Non-Viable tissue/material. Material removed includes Subcutaneous Tissue and Slough and. A time out was conducted at 15:40, prior to the start of the procedure. A Minimum amount of bleeding was controlled with Pressure. The procedure was tolerated well. Post Debridement Measurements: 0.7cm length x 1cm width x 0.2cm depth; 0.11cm^3 volume. Post debridement Stage noted as Category/Stage IV. Character of Wound/Ulcer Post Debridement is stable. Post procedure Diagnosis Wound #1:  Same as Pre-Procedure Wound #8 Pre-procedure diagnosis of Wound #8 is a Pressure Ulcer located on the  Right,Proximal,Lateral Foot . There was a Excisional Skin/Subcutaneous Tissue Debridement with a total area of 0.25 sq cm performed by Tommie Sams., PA-C. With the following instrument(s): Curette to remove Viable and Non-Viable tissue/material. Material removed includes Subcutaneous Tissue and Slough and. A time out was conducted at 15:40, prior to the start of the procedure. A Minimum amount of bleeding was controlled with Pressure. The procedure was tolerated well. Post Debridement Measurements: 0.5cm length x 0.5cm width x 0.2cm depth; 0.039cm^3 volume. Post debridement Stage noted as Unstageable/Unclassified. Character of Wound/Ulcer Post Debridement is stable. Post procedure Diagnosis Wound #8: Same as Pre-Procedure Ralph Peterson, Ralph Peterson ([id number]) Plan Follow-up Appointments: Return Appointment in 1 week. Bathing/ Shower/ Hygiene: Clean wound with Normal Saline or wound cleanser. Off-Loading: Open toe surgical shoe with peg assist. - Right foot WOUND #1: - Foot Wound Laterality: Right, Lateral, Distal Cleanser: Normal Saline (Generic) 3 x Per Week/30 Days Discharge Instructions: Wash your hands with soap and water. Remove old dressing, discard into plastic bag and place into trash. Cleanse the wound with Normal Saline prior to applying a clean dressing using gauze sponges, not tissues or cotton balls. Do not scrub or use excessive force. Pat dry using gauze sponges, not tissue or cotton balls. Primary Dressing: Prisma 4.34 (in) 3 x Per Week/30 Days Discharge Instructions: Moisten with hydrogel Secondary Dressing: ABD Pad 5x9 (in/in) (Generic) 3 x Per Week/30 Days Discharge Instructions: Cover with ABD pad Secured With: 70M Medipore H Soft Cloth Surgical Tape, 2x2 (in/yd) (Generic) 3 x Per Week/30 Days Discharge Instructions: Secure gauze bandage Secured With: Conforming Stretch Gauze Bandage 4x75 (in/in) (Generic) 3 x Per Week/30 Days Discharge Instructions: Apply as directed,  wrap around ankle to anchor WOUND #7: - Foot Wound Laterality: Dorsal, Right Cleanser: Normal Saline (Generic) 3 x Per Week/30 Days Discharge Instructions: Wash your hands with soap and water. Remove old dressing, discard into plastic bag and place into trash. Cleanse the wound with Normal Saline prior to applying a clean dressing using gauze sponges, not tissues or cotton balls. Do not scrub or use excessive force. Pat dry using gauze sponges, not tissue or cotton balls. Primary Dressing: Prisma 4.34 (in) 3 x Per Week/30 Days Discharge Instructions: Moisten with hydrogel Secondary Dressing: ABD Pad 5x9 (in/in) (Generic) 3 x Per Week/30 Days Discharge Instructions: Cover with ABD pad Secured With: 70M Medipore H Soft Cloth Surgical Tape, 2x2 (in/yd) (Generic) 3 x Per Week/30 Days Discharge Instructions: Secure gauze bandage Secured With: Conforming Stretch Gauze Bandage 4x75 (in/in) (Generic) 3 x Per Week/30 Days Discharge Instructions: Apply as directed, wrap around ankle to anchor WOUND #8: - Foot Wound Laterality: Right, Lateral, Proximal Cleanser: Normal Saline (Generic) 3 x Per Week/30 Days Discharge Instructions: Wash your hands with soap and water. Remove old dressing, discard into plastic bag and place into trash. Cleanse the wound with Normal Saline prior to applying a clean dressing using gauze sponges, not tissues or cotton balls. Do not scrub or use excessive force. Pat dry using gauze sponges, not tissue or cotton balls. Primary Dressing: Prisma 4.34 (in) 3 x Per Week/30 Days Discharge Instructions: Moisten with hydrogel Secondary Dressing: ABD Pad 5x9 (in/in) (Generic) 3 x Per Week/30 Days Discharge Instructions: Cover with ABD pad Secured With: 70M Medipore H Soft Cloth Surgical Tape, 2x2 (in/yd) (Generic) 3 x Per Week/30 Days Discharge Instructions: Secure gauze bandage Secured With: Conforming Stretch Gauze  Bandage 4x75 (in/in) (Generic) 3 x Per Week/30 Days Discharge  Instructions: Apply as directed, wrap around ankle to anchor 1. Would recommend currently that we going continue with wound care measures as before and patient is in agreement with the plan that includes the use of the silver collagen dressing that we can moisten with hydrogel. 2. I am also can recommend that we have the patient continue to use the oil emulsion dressing over top of this which I think is also can help With His Moisture and Hopefully Prevent It from Drying out. 3. I Am Also Can Recommend the Patient Continue to Be Monitored for Any Signs of Infection Right Now His Foot Seems to Be Doing Excellent and Very Pleased with That I Am Hopeful We Get Some These Areas Closed Shortly. We will see patient back for reevaluation in 1 week here in the clinic. If anything worsens or changes patient will contact our office for additional recommendations. Electronic Signature(s) Signed: 10/12/2020 3:52:55 PM By: Worthy Keeler PA-C Entered By: Worthy Keeler on 10/12/2020 15:52:55 Ralph Peterson ([id number]) -------------------------------------------------------------------------------- SuperBill Details Patient Name: Ralph Peterson Date of Service: 10/12/2020 Medical Record Number: [id number] Patient Account Number: [id number] Date of Birth/Sex: 04/03/1966 (55 y.o. M) Treating RN: Dolan Amen Primary Care Provider: Vidal Schwalbe Other Clinician: Referring Provider: Vidal Schwalbe Treating Provider/Extender: Skipper Cliche in Treatment: 24 Diagnosis Coding ICD-10 Codes Code Description [phone] Pressure ulcer of other site, stage 3 T81.31XA Disruption of external operation (surgical) wound, not elsewhere classified, initial encounter 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d L97.812 Non-pressure chronic ulcer of other part of right lower leg with fat layer exposed E11.621 Type 2 diabetes mellitus with foot ulcer I50.42  Chronic combined systolic (congestive) and diastolic (congestive) heart failure N18.6 End stage renal disease Z99.2 Dependence on renal dialysis Facility Procedures CPT4 Code: [id number] Description: [id number] - DEB SUBQ TISSUE 20 SQ CM/< Modifier: Quantity: 1 CPT4 Code: Description: ICD-10 Diagnosis Description L89.893 Pressure ulcer of other site, stage 3 L97.512 Non-pressure chronic ulcer of other part of right foot with fat layer ex Modifier: posed Quantity: Physician Procedures CPT4 [id number] Description: 11042 - WC PHYS SUBQ TISS 20 SQ CM Modifier: Quantity: 1 CPT4 Code: Description: ICD-10 Diagnosis Description L89.893 Pressure ulcer of other site, stage 3 L97.512 Non-pressure chronic ulcer of other part of right foot with fat layer ex Modifier: posed Quantity: Electronic Signature(s) Signed: 10/12/2020 3:53:25 PM By: Worthy Keeler PA-C Entered By: Worthy Keeler on 10/12/2020 15:53:24

## 2020-10-13 ENCOUNTER — Telehealth (INDEPENDENT_AMBULATORY_CARE_PROVIDER_SITE_OTHER): Payer: Self-pay | Admitting: Nurse Practitioner

## 2020-10-13 ENCOUNTER — Telehealth (INDEPENDENT_AMBULATORY_CARE_PROVIDER_SITE_OTHER): Payer: Self-pay

## 2020-10-13 ENCOUNTER — Other Ambulatory Visit: Admission: RE | Admit: 2020-10-13 | Payer: Medicare PPO | Source: Ambulatory Visit

## 2020-10-13 NOTE — Telephone Encounter (Signed)
Patient called in stating that his wound center thought he should delay his RLE angio to give his ulcer a chance to heal. After speaking with the NP Eulogio Ditch the patient has been rescheduled from 10/17/20 to 11/14/20. Pre-procedure instructions will be mailed.

## 2020-10-13 NOTE — Telephone Encounter (Signed)
I contacted the patient after receiving a voicemail concerning his upcoming angiogram procedure on Tuesday, 10/17/2020.  The patient had recently visited his wound care provider and they are concerned that the swelling may deteriorate the progression of the wound has made.  The patient states that the wound care provider feels that the patient has adequate perfusion for wound healing.  The patient is wondering if postponing the procedure would be a better alternative.  I discussed with the patient that the primary motivation for his procedure was due to his delayed wound healing, so if he is showing great improvement it is reasonable to delay his angiogram.  We will move his angiogram back by 1 month.  If the patient is continuing to heal we can cancel the angiogram however if the wound begins to deteriorate we can move forward with procedure.  The patient is in agreements with that approach.  Patient is advised to contact her office if the wound does begin to deteriorate or have worsening symptoms.

## 2020-10-16 NOTE — Progress Notes (Signed)
Ralph Peterson, Ralph Peterson ([id number]) Visit Report for 10/12/2020 Arrival Information Details Patient Name: Ralph Peterson, Ralph Peterson. Date of Service: 10/12/2020 2:45 PM Medical Record Number: [id number] Patient Account Number: [id number] Date of Birth/Sex: 10/17/1965 (55 y.o. M) Treating RN: Carlene Coria Primary Care Jamell Opfer: Vidal Schwalbe Other Clinician: Referring Chidera Dearcos: Vidal Schwalbe Treating Zuzu Befort/Extender: Skipper Cliche in Treatment: 24 Visit Information History Since Last Visit Added or deleted any medications: No Patient Arrived: Wheel Chair Had a fall or experienced change in No Arrival Time: 15:05 activities of daily living that may affect Accompanied By: son risk of falls: Transfer Assistance: Easy[employer] Patient Hospitalized since last visit: No Lift Has Dressing in Place as Prescribed: Yes Patient Identification Verified: Yes Has Footwear/Offloading in Place as Prescribed: Yes Secondary Verification Process Completed: Yes Left: Other: Patient Requires Transmission-Based No Pain Present Now: Yes Precautions: Patient Has Alerts: No Electronic Signature(s) Signed: 10/16/2020 5:08:01 PM By: Carlene Coria RN Entered By: Carlene Coria on 10/12/2020 15:08:38 Ralph Peterson ([id number]) -------------------------------------------------------------------------------- Clinic Level of Care Assessment Details Patient Name: Ralph Peterson Date of Service: 10/12/2020 2:45 PM Medical Record Number: [id number] Patient Account Number: [id number] Date of Birth/Sex: [birth year] (55 y.o. M) Treating RN: Dolan Amen Primary Care Shamica Moree: Vidal Schwalbe Other Clinician: Referring Ildefonso Keaney: Vidal Schwalbe Treating Odell Fasching/Extender: Skipper Cliche in Treatment: 24 Clinic Level of Care Assessment Items TOOL 1 Quantity Score '[]'$  - Use when EandM and Procedure is performed on INITIAL visit 0 ASSESSMENTS - Nursing Assessment / Reassessment '[]'$  - General Physical  Exam (combine w/ comprehensive assessment (listed just below) when performed on new 0 pt. evals) '[]'$  - 0 Comprehensive Assessment (HX, ROS, Risk Assessments, Wounds Hx, etc.) ASSESSMENTS - Wound and Skin Assessment / Reassessment '[]'$  - Dermatologic / Skin Assessment (not related to wound area) 0 ASSESSMENTS - Ostomy and/or Continence Assessment and Care '[]'$  - Incontinence Assessment and Management 0 '[]'$  - 0 Ostomy Care Assessment and Management (repouching, etc.) PROCESS - Coordination of Care '[]'$  - Simple Patient / Family Education for ongoing care 0 '[]'$  - 0 Complex (extensive) Patient / Family Education for ongoing care '[]'$  - 0 Staff obtains Programmer, systems, Records, Test Results / Process Orders '[]'$  - 0 Staff telephones HHA, Nursing Homes / Clarify orders / etc '[]'$  - 0 Routine Transfer to another Facility (non-emergent condition) '[]'$  - 0 Routine Hospital Admission (non-emergent condition) '[]'$  - 0 New Admissions / Biomedical engineer / Ordering NPWT, Apligraf, etc. '[]'$  - 0 Emergency Hospital Admission (emergent condition) PROCESS - Special Needs '[]'$  - Pediatric / Minor Patient Management 0 '[]'$  - 0 Isolation Patient Management '[]'$  - 0 Hearing / Language / Visual special needs '[]'$  - 0 Assessment of Community assistance (transportation, D/C planning, etc.) '[]'$  - 0 Additional assistance / Altered mentation '[]'$  - 0 Support Surface(s) Assessment (bed, cushion, seat, etc.) INTERVENTIONS - Miscellaneous '[]'$  - External ear exam 0 '[]'$  - 0 Patient Transfer (multiple staff / Civil Service fast streamer / Similar devices) '[]'$  - 0 Simple Staple / Suture removal (25 or less) '[]'$  - 0 Complex Staple / Suture removal (26 or more) '[]'$  - 0 Hypo/Hyperglycemic Management (do not check if billed separately) '[]'$  - 0 Ankle / Brachial Index (ABI) - do not check if billed separately Has the patient been seen at the hospital within the last three years: Yes Total Score: 0 Level Of Care: ____ Ralph Peterson  ([id number]) Electronic Signature(s) Signed: 10/12/2020 4:40:53 PM By: Georges Mouse, Minus Breeding RN Entered By: Georges Mouse, Kenia on 10/12/2020 15:56:59 Ralph Peterson ([id number]) --------------------------------------------------------------------------------  Lower Extremity Assessment Details Patient Name: Ralph Peterson, Ralph Peterson. Date of Service: 10/12/2020 2:45 PM Medical Record Number: [id number] Patient Account Number: [id number] Date of Birth/Sex: [birth year] (55 y.o. M) Treating RN: Cornell Barman Primary Care Passion Lavin: Vidal Schwalbe Other Clinician: Referring Joren Rehm: Vidal Schwalbe Treating Shavonna Corella/Extender: Skipper Cliche in Treatment: 24 Edema Assessment Assessed: [Left: No] [Right: No] Edema: [Left: Ye] [Right: s] Vascular Assessment Pulses: Dorsalis Pedis Palpable: [Right:Yes] Electronic Signature(s) Signed: 10/12/2020 5:33:01 PM By: Gretta Cool, BSN, RN, CWS, Kim RN, BSN Entered By: Gretta Cool, BSN, RN, CWS, Kim on 10/12/2020 15:35:15 Ralph Peterson ([id number]) -------------------------------------------------------------------------------- Multi Wound Chart Details Patient Name: Ralph Peterson Date of Service: 10/12/2020 2:45 PM Medical Record Number: [id number] Patient Account Number: [id number] Date of Birth/Sex: 12-Sep-1965 (55 y.o. 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 Comorbid  History: Arrhythmia, Congestive Heart Arrhythmia, Congestive Heart Arrhythmia, Congestive Heart Failure, Coronary Artery Disease, Failure, Coronary Artery Disease, Failure, Coronary Artery Disease, Hypertension, Myocardial Infarction, Hypertension, Myocardial Infarction, Hypertension, Myocardial Infarction, Peripheral Venous Disease, Type II Peripheral Venous Disease, Type II Peripheral Venous Disease, Type II Diabetes, Neuropathy Diabetes, Neuropathy Diabetes, Neuropathy Date Acquired: 02/20/2020 09/05/2020 09/07/2020 Weeks of Treatment: '24 5 2 '$ Wound Status: Open Open Open Measurements L x W x D (cm) 0.7x1x0.2 0.8x0.4x0.1 0.5x0.5x0.2 Area (cm) : 0.55 0.251 0.196 Volume (cm) : 0.11 0.025 0.039 % Reduction in Area: 89.10% 98.50% -55.60% % Reduction in Volume: 95.70% 98.60% -200.00% Classification: Category/Stage IV Grade 2 Unstageable/Unclassified Exudate Amount: Medium Medium None Present Exudate Type: Serosanguineous Sanguinous N/A Exudate Color: red, brown red N/A Wound Margin: Distinct, outline attached Flat and Intact Flat and Intact Granulation Amount: Large (67-100%) None Present (0%) None Present (0%) Granulation Quality: Pink, Pale N/A N/A Necrotic Amount: None Present (0%) Large (67-100%) Large (67-100%) Necrotic Tissue: N/A Eschar Eschar Exposed Structures: Fat Layer (Subcutaneous Tissue): Fat Layer (Subcutaneous Tissue): Fascia: No Yes Yes Fat Layer (Subcutaneous Tissue): Fascia: No Fascia: No No Tendon: No Tendon: No Tendon: No Muscle: No Muscle: No Muscle: No Joint: No Joint: No Joint: No Bone: No Bone: No Bone: No Epithelialization: None Small (1-33%) N/A Treatment Notes Electronic Signature(s) Signed: 10/12/2020 4:40:53 PM By: Georges Mouse, Minus Breeding RN Entered By: Georges Mouse, Minus Breeding on 10/12/2020 15:40:00 Ralph Peterson ([name]  ([id number]) -------------------------------------------------------------------------------- Multi-Disciplinary Care Plan Details Patient Name: Ralph Peterson Date of Service: 10/12/2020 2:45 PM Medical Record Number: [id number] Patient Account Number: [id number] Date of Birth/Sex: 10-15-1965 (55 y.o. M) Treating RN: Dolan Amen Primary Care Rodolfo Gaster: Vidal Schwalbe Other Clinician: Referring Amabel Stmarie: Vidal Schwalbe Treating Lameshia Hypolite/Extender: Skipper Cliche in Treatment: 24 Active Inactive Necrotic Tissue Nursing Diagnoses: Impaired tissue integrity related to necrotic/devitalized tissue Knowledge deficit related to management of necrotic/devitalized tissue Goals: Necrotic/devitalized tissue will be minimized in the wound bed Date Initiated: 05/09/2020 Target Resolution Date: 05/09/2020 Goal Status: Active Interventions: Assess patient pain level pre-, during and post procedure and prior to discharge Provide education on necrotic tissue and debridement process Treatment Activities: Enzymatic debridement : 05/09/2020 Notes: Electronic Signature(s) Signed: 10/12/2020 4:40:53 PM By: Georges Mouse, Minus Breeding RN Entered By: Georges Mouse, Minus Breeding on  10/12/2020 15:39:18 Ralph Peterson, Ralph Peterson ([id number]) -------------------------------------------------------------------------------- Pain Assessment Details Patient Name: Ralph Peterson, Ralph Peterson. Date of Service: 10/12/2020 2:45 PM Medical Record Number: [id number] Patient Account Number: [id number] Date of Birth/Sex: 10-31-1965 (55 y.o. M) Treating RN: Carlene Coria Primary Care Doroteo Nickolson: Vidal Schwalbe Other Clinician: Referring Tierany Appleby: Vidal Schwalbe Treating Shayann Garbutt/Extender: Skipper Cliche in Treatment: 24 Active Problems Location of Pain Severity and Description of Pain Patient Has Paino Yes Site Locations Duration of the Pain. Constant / Intermittento Intermittent How Long Does it Lasto Hours: Minutes:  15 Rate the pain. Current Pain Level: 1 Character of Pain Describe the Pain: Dull Pain Management and Medication Current Pain Management: Electronic Signature(s) Signed: 10/16/2020 5:08:01 PM By: Carlene Coria RN Entered By: Carlene Coria on 10/12/2020 15:18:30 Ralph Peterson ([id number]) -------------------------------------------------------------------------------- Wound Assessment Details Patient Name: Ralph Peterson. Date of Service: 10/12/2020 2:45 PM Medical Record Number: [id number] Patient Account Number: [id number] Date of Birth/Sex: 1965-08-01 (55 y.o. M) Treating RN: Carlene Coria Primary Care Gretchen Weinfeld: Vidal Schwalbe Other Clinician: Referring Magen Suriano: Vidal Schwalbe Treating Xzaiver Vayda/Extender: Skipper Cliche in Treatment: 24 Wound Status Wound Number: 1 Primary Pressure Ulcer Etiology: Wound Location: Right, Distal, Lateral Foot Wound Open Wounding Event: Pressure Injury Status: Date Acquired: 02/20/2020 Comorbid Arrhythmia, Congestive Heart Failure, Coronary Artery Weeks Of Treatment: 24 History: Disease, Hypertension, Myocardial Infarction, Peripheral Clustered Wound: No Venous Disease, Type II Diabetes, Neuropathy Photos Wound Measurements Length: (cm) 0.7 Width: (cm) 1 Depth: (cm) 0.2 Area: (cm) 0.55 Volume: (cm) 0.11 % Reduction in Area: 89.1% % Reduction in Volume: 95.7% Epithelialization: None Tunneling: No Undermining: No Wound Description Classification: Category/Stage IV Wound Margin: Distinct, outline attached Exudate Amount: Medium Exudate Type: Serosanguineous Exudate Color: red, brown Foul Odor After Cleansing: No Slough/Fibrino Yes Wound Bed Granulation Amount: Large (67-100%) Exposed Structure Granulation Quality: Pink, Pale Fascia Exposed: No Necrotic Amount: None Present (0%) Fat Layer (Subcutaneous Tissue) Exposed: Yes Tendon Exposed: No Muscle Exposed: No Joint Exposed: No Bone Exposed: No Electronic  Signature(s) Signed: 10/12/2020 5:33:01 PM By: Gretta Cool, BSN, RN, CWS, Kim RN, BSN Signed: 10/16/2020 5:08:01 PM By: Carlene Coria RN Entered By: Gretta Cool, BSN, RN, CWS, Kim on 10/12/2020 15:33:21 Ralph Peterson ([id number]) -------------------------------------------------------------------------------- Wound Assessment Details Patient Name: Ralph Peterson. Date of Service: 10/12/2020 2:45 PM Medical Record Number: [id number] Patient Account Number: [id number] Date of Birth/Sex: [birth year] (55 y.o. M) Treating RN: Carlene Coria Primary Care Terese Heier: Vidal Schwalbe Other Clinician: Referring Beyza Bellino: Vidal Schwalbe Treating Jaymir Struble/Extender: Skipper Cliche in Treatment: 24 Wound Status Wound Number: 7 Primary Diabetic Wound/Ulcer of the Lower Extremity Etiology: Wound Location: Right, Dorsal Foot Wound Open Wounding Event: Blister Status: Date Acquired: 09/05/2020 Comorbid Arrhythmia, Congestive Heart Failure, Coronary Artery Weeks Of Treatment: 5 History: Disease, Hypertension, Myocardial Infarction, Peripheral Clustered Wound: No Venous Disease, Type II Diabetes, Neuropathy Photos Wound Measurements Length: (cm) 0.8 Width: (cm) 0.4 Depth: (cm) 0.1 Area: (cm) 0.251 Volume: (cm) 0.025 % Reduction in Area: 98.5% % Reduction in Volume: 98.6% Epithelialization: Small (1-33%) Tunneling: No Undermining: No Wound Description Classification: Grade 2 Wound Margin: Flat and Intact Exudate Amount: Medium Exudate Type: Sanguinous Exudate Color: red Foul Odor After Cleansing: No Slough/Fibrino No Wound Bed Granulation Amount: None Present (0%) Exposed Structure Necrotic Amount: Large (67-100%) Fascia Exposed: No Necrotic Quality: Eschar Fat Layer (Subcutaneous Tissue) Exposed: Yes Tendon Exposed: No Muscle Exposed: No Joint Exposed: No Bone Exposed: No Electronic Signature(s) Signed: 10/12/2020 5:33:01 PM By: Gretta Cool, BSN, RN, CWS, Kim RN, BSN Signed: 10/16/2020  5:08:01 PM By: Carlene Coria  RN Entered By: Gretta Cool, BSN, RN, CWS, Kim on 10/12/2020 15:34:02 Ralph Peterson ([id number]) -------------------------------------------------------------------------------- Wound Assessment Details Patient Name: Ralph Peterson Date of Service: 10/12/2020 2:45 PM Medical Record Number: [id number] Patient Account Number: [id number] Date of Birth/Sex: Jul 20, 1966 (55 y.o. M) Treating RN: Carlene Coria Primary Care Kian Gamarra: Vidal Schwalbe Other Clinician: Referring Coumba Kellison: Vidal Schwalbe Treating Lizbeth Feijoo/Extender: Skipper Cliche in Treatment: 24 Wound Status Wound Number: 8 Primary Pressure Ulcer Etiology: Wound Location: Right, Proximal, Lateral Foot Wound Open Wounding Event: Gradually Appeared Status: Date Acquired: 09/07/2020 Comorbid Arrhythmia, Congestive Heart Failure, Coronary Artery Weeks Of Treatment: 2 History: Disease, Hypertension, Myocardial Infarction, Peripheral Clustered Wound: No Venous Disease, Type II Diabetes, Neuropathy Photos Wound Measurements Length: (cm) 0.5 Width: (cm) 0.5 Depth: (cm) 0.2 Area: (cm) 0.196 Volume: (cm) 0.039 % Reduction in Area: -55.6% % Reduction in Volume: -200% Wound Description Classification: Unstageable/Unclassified Wound Margin: Flat and Intact Exudate Amount: None Present Foul Odor After Cleansing: No Slough/Fibrino No Wound Bed Granulation Amount: None Present (0%) Exposed Structure Necrotic Amount: Large (67-100%) Fascia Exposed: No Necrotic Quality: Eschar Fat Layer (Subcutaneous Tissue) Exposed: No Tendon Exposed: No Muscle Exposed: No Joint Exposed: No Bone Exposed: No Electronic Signature(s) Signed: 10/12/2020 5:33:01 PM By: Gretta Cool, BSN, RN, CWS, Kim RN, BSN Signed: 10/16/2020 5:08:01 PM By: Carlene Coria RN Entered By: Gretta Cool, BSN, RN, CWS, Kim on 10/12/2020 15:34:33 Ralph Peterson  ([id number]) -------------------------------------------------------------------------------- Vitals Details Patient Name: Ralph Peterson. Date of Service: 10/12/2020 2:45 PM Medical Record Number: [id number] Patient Account Number: [id number] Date of Birth/Sex: August 31, 1965 (55 y.o. M) Treating RN: Carlene Coria Primary Care [place] Ndiaye: Vidal Schwalbe Other Clinician: Referring Mabrey Howland: Vidal Schwalbe Treating Keisuke Hollabaugh/Extender: Skipper Cliche in Treatment: 24 Vital Signs Time Taken: 15:08 Temperature (F): 98.7 Height (in): 72 Pulse (bpm): 83 Weight (lbs): 225 Respiratory Rate (breaths/min): 18 Body Mass Index (BMI): 30.5 Blood Pressure (mmHg): 122/80 Reference Range: 80 - 120 mg / dl Electronic Signature(s) Signed: 10/16/2020 5:08:01 PM By: Carlene Coria RN Entered By: Carlene Coria on 10/12/2020 15:11:00

## 2020-10-17 DIAGNOSIS — I70299 Other atherosclerosis of native arteries of extremities, unspecified extremity: Secondary | ICD-10-CM

## 2020-10-19 ENCOUNTER — Other Ambulatory Visit: Payer: Self-pay

## 2020-10-19 ENCOUNTER — Encounter: Payer: Medicare PPO | Admitting: Physician Assistant

## 2020-10-19 DIAGNOSIS — T8781 Dehiscence of amputation stump: Secondary | ICD-10-CM | POA: Diagnosis not present

## 2020-10-19 NOTE — Progress Notes (Addendum)
AVIV, FLANNELLY ([id number]) Visit Report for 10/19/2020 Chief Complaint Document Details Patient Name: Ralph Peterson. Date of Service: 10/19/2020 8:00 AM Medical Record Number: [id number] Patient Account Number: [id number] Date of Birth/Sex: [birth year] (55 y.o. 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 M) Treating RN: Dolan Amen Primary Care Provider: Vidal Schwalbe Other Clinician: Jeanine Luz Referring Provider: Vidal Schwalbe Treating Provider/Extender: Skipper Cliche in Treatment: 25 Debridement Performed for Wound #7 Right,Dorsal Foot Assessment: Performed By: Physician Tommie Sams., PA-C Debridement Type: Debridement Severity of Tissue Pre Debridement: Fat layer exposed Level of Consciousness (Pre- Awake and Alert procedure): Pre-procedure Verification/Time Out Yes - 08:55 Taken: Start Time: 08:55 Pain Control: Lidocaine 4% Topical Solution Total Area Debrided (L x W): 0.6 (cm) x 0.5 (cm) = 0.3 (cm) Tissue and other material Non-Viable, Eschar debrided: Level: Non-Viable Tissue Debridement Description: Selective/Open Wound Instrument: Curette Bleeding: None End Time: 08:58 Response to Treatment: Procedure was  tolerated well Level of Consciousness (Post- Awake and Alert procedure): Post Debridement Measurements of Total Wound Length: (cm) 0.6 Width: (cm) 0.5 Depth: (cm) 0.1 Volume: (cm) 0.024 Character of Wound/Ulcer Post Debridement: Improved Severity of Tissue Post Debridement: Fat layer exposed Post Procedure Diagnosis Same as Pre-procedure Electronic Signature(s) Signed: 10/19/2020 5:09:32 PM By: Charlett Nose RN Signed: 10/19/2020 5:44:32 PM By: Worthy Keeler PA-C Entered By: Worthy Keeler on 10/19/2020 08:59:11 Ralph Peterson ([id number]) -------------------------------------------------------------------------------- Debridement Details Patient Name: Ralph Peterson. Date of Service: 10/19/2020 8:00 AM Medical Record Number: [id number] Patient Account Number: [id number] Date of Birth/Sex: 1966-04-14 (55 y.o. M) Treating RN: Dolan Amen Primary Care Provider: Vidal Schwalbe Other Clinician: Jeanine Luz Referring Provider: Vidal Schwalbe Treating Provider/Extender: Skipper Cliche in Treatment: 25 Debridement Performed for Wound #8 Right,Proximal,Lateral Foot Assessment: Performed By: Physician Tommie Sams., PA-C Debridement Type: Debridement Level of Consciousness (Pre- Awake and Alert procedure): Pre-procedure Verification/Time Out Yes - 09:02 Taken: Start Time: 09:02 Pain Control: Lidocaine 4% Topical Solution Total Area Debrided (L x W): 0.5 (cm) x 0.5 (cm) = 0.25 (cm) Tissue and other material Non-Viable, Eschar, Subcutaneous, Skin: Epidermis debrided: Level: Skin/Subcutaneous Tissue Debridement Description: Excisional Instrument: Curette Bleeding: Minimum Hemostasis Achieved: Pressure End Time: 09:05 Response to Treatment: Procedure was tolerated well Level of Consciousness (Post- Awake and Alert procedure): Post Debridement Measurements of Total Wound Length: (cm) 0.5 Stage: Unstageable/Unclassified Width: (cm)  0.5 Depth: (cm) 0.2 Volume: (cm) 0.039 Character of Wound/Ulcer Post Debridement: Improved Post Procedure Diagnosis Same as Pre-procedure Electronic Signature(s) Signed: 10/19/2020 5:09:32 PM By: Charlett Nose RN Signed: 10/19/2020 5:44:32 PM By: Worthy Keeler PA-C Entered By: Worthy Keeler on 10/19/2020 09:05:25 Ralph Peterson ([id number]) -------------------------------------------------------------------------------- Debridement Details Patient Name: Ralph Peterson. Date of Service: 10/19/2020 8:00 AM Medical Record Number: [id number] Patient Account Number: [id number] Date of Birth/Sex: Dec 11, 1965 (55 y.o. M) Treating RN: Dolan Amen Primary Care Provider: Vidal Schwalbe Other Clinician: Jeanine Luz Referring Provider: Vidal Schwalbe Treating Provider/Extender: Skipper Cliche in Treatment: 25  Debridement Performed for Wound #1 Right,Distal,Lateral Foot Assessment: Performed By: Physician Tommie Sams., PA-C Debridement Type: Debridement Level of Consciousness (Pre- Awake and Alert procedure): Pre-procedure Verification/Time Out Yes - 08:58 Taken: Start Time: 08:58 Pain Control: Lidocaine 4% Topical Solution Total Area Debrided (L x W): 0.5 (cm) x 1 (cm) = 0.5 (cm) Tissue and other material Non-Viable, Eschar, Subcutaneous, Skin: Epidermis debrided: Level: Skin/Subcutaneous Tissue Debridement Description: Excisional Instrument: Curette Bleeding: Minimum Hemostasis Achieved: Pressure End Time: 09:02 Response to Treatment: Procedure was tolerated well Level of Consciousness (Post- Awake and Alert procedure): Post Debridement Measurements of Total Wound Length: (cm) 0.5 Stage: Category/Stage IV Width: (cm) 1 Depth: (cm) 0.2 Volume: (cm) 0.079 Character of Wound/Ulcer Post Debridement: Improved Post Procedure Diagnosis Same as Pre-procedure Electronic Signature(s) Signed: 10/19/2020 5:09:32 PM By: Charlett Nose  RN Signed: 10/19/2020 5:44:32 PM By: Worthy Keeler PA-C Entered By: Georges Mouse, Minus Breeding on 10/19/2020 09:09:27 Ralph Peterson ([id number]) -------------------------------------------------------------------------------- Debridement Details Patient Name: Ralph Peterson. Date of Service: 10/19/2020 8:00 AM Medical Record Number: [id number] Patient Account Number: [id number] Date of Birth/Sex: 24-Nov-1965 (55 y.o. M) Treating RN: Dolan Amen Primary Care Provider: Vidal Schwalbe Other Clinician: Jeanine Luz Referring Provider: Vidal Schwalbe Treating Provider/Extender: Skipper Cliche in Treatment: 25 Debridement Performed for Wound #9 Right,Medial Lower Leg Assessment: Performed By: Physician Tommie Sams., PA-C Debridement Type: Chemical/Enzymatic/Mechanical Agent Used: saline and gauze Severity of Tissue Pre Debridement: Fat layer exposed Level of Consciousness (Pre- Awake and Alert procedure): Pre-procedure Verification/Time Out Yes - 09:10 Taken: Start Time: 09:10 Instrument: Other : gauze and saline Bleeding: None Response to Treatment: Procedure was tolerated well Level of Consciousness (Post- Awake and Alert procedure): Post Debridement Measurements of Total Wound Length: (cm) 9.9 Width: (cm) 5.6 Depth: (cm) 0.1 Volume: (cm) 4.354 Character of Wound/Ulcer Post Debridement: Stable Severity of Tissue Post Debridement: Fat layer exposed Post Procedure Diagnosis Same as Pre-procedure Electronic Signature(s) Signed: 10/19/2020 5:09:32 PM By: Charlett Nose RN Signed: 10/19/2020 5:44:32 PM By: Worthy Keeler PA-C Entered By: Georges Mouse, Minus Breeding on 10/19/2020 09:10:37 Ralph Peterson ([id number]) -------------------------------------------------------------------------------- HPI Details Patient Name: Ralph Peterson. Date of Service: 10/19/2020 8:00 AM Medical Record Number: [id number] Patient Account Number:  [id number] Date of Birth/Sex: Apr 01, 1966 (55 y.o. M) Treating RN: Dolan Amen Primary Care Provider: Vidal Schwalbe Other Clinician: Jeanine Luz Referring Provider: Vidal Schwalbe Treating Provider/Extender: Skipper Cliche in Treatment: 25 History of Present Illness HPI Description: 08/17/2020 upon evaluation today patient actually appears to be doing decently well today which is good news in regard to the dorsal wound which is almost healed and the lateral wound also doing much better. With regard to the plantar wound this is still draining purulent drainage unfortunately the culture did reveal that he had Pseudomonas as well as Enterococcus. With that being said he is allergic to penicillin which took away the only oral medication for the Enterococcus I did look at linezolid as a possibility but unfortunately there were medication interactions. Also the same was true the Cipro where there were medication interactions. Nonetheless I want to see if we can get him into infectious disease to see if they can work with him to try to find possibly add at home IV antibiotic regimen that can help out at this point. No sharp debridement is can be necessary today. 09/07/2020 upon evaluation today patient appears to be doing unfortunately not too well in regard to his wound. He has been tolerating the dressing changes without complication.  Fortunately there is no sign of active infection at this time. No fevers, chills, nausea, vomiting, or diarrhea. The patient did have a repeat surgery going to clean out the abscess and try to work on getting things moving in a better direction about a week and a half ago. He still has sutures in place. With that being said unfortunately he does not appear to be showing signs of a lot of improvement in my opinion. He does have sutures on the plantar aspect still in place and on the dorsal aspect he has a small opening which actually probes all the way down through  and actually came out of the plantar aspect. I feel like that he really is having a lot of issues here and I am afraid that if we do not get this taken care of this is can end up with him having a much larger and more extensive amputation than what he was hoping to have to deal with with this. It has already been since June 2021 but has been dealing with this including all the time since the amputation. 09/14/2020 upon evaluation today patient appears to be doing really about the same in regard to the wound on the dorsal surface of his foot. The plantar foot unfortunately still has sutures in place I really cannot see how things are doing in that regard. With the dorsal foot however this appears to show signs of still having a small opening in the distal aspect which does actually probe down to bone there is also significant purulent drainage we did actually obtain a wound culture today to further evaluate the issue here. 09/21/2020 upon evaluation today patient appears to be doing about the same in regard to his wound on the dorsal surface of his foot and the plantar foot he still does not have the sutures out at this point. That should hopefully happen in the next week. With that being said the main issue is he continues to have drainage here. It was noted that he had positive findings on culture for Enterobacter. With that being said he cannot take oral penicillin he could do vancomycin and I think that is good to be the ideal thing this to see if we can get him started on IV vancomycin at dialysis. We will get a get in touch with them to try to see if we can coordinate that being done there at the dialysis center. I think that is the ideal situation at this point I would probably start with just 2 weeks of therapy. 09/28/2020 upon evaluation today patient appears to be doing well with regard to his wounds in general. He also has back on the vancomycin after the culture results and this seems to be  doing excellent for him. All the wounds are showing signs of good improvement. No fevers, chills, nausea, vomiting, or diarrhea. 10/05/2020 upon evaluation today patient appears to actually be doing excellent in regard to his foot. Fortunately there is a lot of signs of new granulation epithelization at this point. There does not appear to be any evidence of active infection which is great news and overall very pleased with where things stand. In fact the open wound along the plantar aspect of his foot as well as the dorsal part of his foot has dramatically improved since we went forward with the vancomycin and requested that the dialysis center provide this to him by way of IV antibiotics with his dialysis. Overall I think that has made a  dramatic improvement and overall his wound seems to be doing significantly better at this point. No fevers, chills, nausea, vomiting, or diarrhea. The patient was set to have his second opinion appointment with podiatry [facility] next week but to be perfectly honest I am not even certain that that is necessary at this point I do not think there is much that they would be able to do for him nor that he really would want them to do considering how well things look at this time. 10/12/2020 on evaluation today patient appears to be doing excellent in regard to his wounds. In fact the only issue I see is that everything is dried out as far as the collagen is concerned. I do think we can try to keep this moist he will continue to make excellent progress as he has been. Overall I am extremely pleased with what I see today. 10/19/2020 upon evaluation today patient appears to be doing decently well in regard to the wounds on his foot he is can require some debridement in this location. Unfortunately is having some issues with blistering over the leg which is of greater concern at this point. Fortunately I do not see any signs of active infection which is great news. Nonetheless the  blistering may just be related to venous insufficiency or subsequently could potentially be an issue with overall worsening with regard to an infection but again it is not really hot to touch. Electronic Signature(s) Signed: 10/19/2020 9:19:55 AM By: Worthy Keeler PA-C Entered By: Worthy Keeler on 10/19/2020 09:19:54 Ralph Peterson ([id number]) -------------------------------------------------------------------------------- Physical Exam Details Patient Name: Ralph Peterson Date of Service: 10/19/2020 8:00 AM Medical Record Number: [id number] Patient Account Number: [id number] Date of Birth/Sex: 16-Jul-1966 (55 y.o. M) Treating RN: Dolan Amen Primary Care Provider: Vidal Schwalbe Other Clinician: Jeanine Luz Referring Provider: Vidal Schwalbe Treating Provider/Extender: Skipper Cliche in Treatment: 13 Constitutional Well-nourished and well-hydrated in no acute distress. Respiratory normal breathing without difficulty. Psychiatric this patient is able to make decisions and demonstrates good insight into disease process. Alert and Oriented x 3. pleasant and cooperative. Notes Patient's wounds all had some of the collagen stuck to them despite the use of the hydrogel. I think would be 1 using oil emulsion dressing over top of this in order to try to help with management of these wounds and preventing them from drying out too much. With that being said I did perform debridement clear away all the necrotic debris including the leftover dressing material as well as eschar and slough from the surface of the wounds all the wounds appear to be doing significantly better which is great news. Electronic Signature(s) Signed: 10/19/2020 9:20:30 AM By: Worthy Keeler PA-C Entered By: Worthy Keeler on 10/19/2020 09:20:30 Ralph Peterson ([id number]) -------------------------------------------------------------------------------- Physician Orders Details Patient  Name: Ralph Peterson Date of Service: 10/19/2020 8:00 AM Medical Record Number: [id number] Patient Account Number: [id number] Date of Birth/Sex: March 27, 1966 (55 y.o. M) Treating RN: Dolan Amen Primary Care Provider: Vidal Schwalbe Other Clinician: Jeanine Luz Referring Provider: Vidal Schwalbe Treating Provider/Extender: Skipper Cliche in Treatment: 25 Verbal / Phone Orders: No Diagnosis Coding ICD-10 Coding Code Description [phone] Pressure ulcer of other site, stage 3 T81.31XA Disruption of external operation (surgical) wound, not elsewhere classified, initial encounter 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d L97.812 Non-pressure chronic ulcer of other part of right lower leg with fat layer exposed E11.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 Bathing/ Shower/ Hygiene o Clean wound with Normal Saline or wound cleanser. Off-Loading o Open toe surgical shoe with peg assist. - Right foot Wound Treatment Wound #1 - Foot Wound Laterality: Right, Lateral, Distal Cleanser: Normal Saline (Generic) 3 x Per Week/30 Days Discharge Instructions: Wash your hands with soap and water. Remove old dressing, discard into plastic bag and place into trash. Cleanse the wound with Normal Saline prior to applying a clean dressing using gauze sponges, not tissues or cotton balls. Do not scrub or use excessive force. Pat dry using gauze sponges, not tissue or cotton balls. Primary Dressing: Prisma 4.34 (in) (DME) (Generic) 3 x Per Week/30 Days Discharge Instructions: Moisten with hydrogel Primary Dressing: Curad Oil Emulsion Dressing 3x3 (in/in) 3 x Per Week/30 Days Discharge Instructions: Apply on top of prisma Secondary  Dressing: ABD Pad 5x9 (in/in) (Generic) 3 x Per Week/30 Days Discharge Instructions: Cover with ABD pad Secured With: 76M Medipore H Soft Cloth Surgical Tape, 2x2 (in/yd) (Generic) 3 x Per Week/30 Days Discharge Instructions: Secure gauze bandage Secured With: Kerlix Roll Sterile or Non-Sterile 6-ply 4.5x4 (yd/yd) 3 x Per Week/30 Days Discharge Instructions: Apply Kerlix as directed Wound #7 - Foot Wound Laterality: Dorsal, Right Cleanser: Normal Saline (Generic) 3 x Per Week/30 Days Discharge Instructions: Wash your hands with soap and water. Remove old dressing, discard into plastic bag and place into trash. Cleanse the wound with Normal Saline prior to applying a clean dressing using gauze sponges, not tissues or cotton balls. Do not scrub or use excessive force. Pat dry using gauze sponges, not tissue or cotton balls. Primary Dressing: Prisma 4.34 (in) 3 x Per Week/30 Days Discharge Instructions: Moisten with hydrogel Primary Dressing: Curad Oil Emulsion Dressing 3x3 (in/in) 3 x Per Week/30 Days DELVECCHIO, WOLBERS ([id number]) Discharge Instructions: Apply on top of prisma Secondary Dressing: ABD Pad 5x9 (in/in) (Generic) 3 x Per Week/30 Days Discharge Instructions: Cover with ABD pad Secured With: 76M Medipore H Soft Cloth Surgical Tape, 2x2 (in/yd) (Generic) 3 x Per Week/30 Days Discharge Instructions: Secure gauze bandage Secured With: Kerlix Roll Sterile or Non-Sterile 6-ply 4.5x4 (yd/yd) 3 x Per Week/30 Days Discharge Instructions: Apply Kerlix as directed Wound #8 - Foot Wound Laterality: Right, Lateral, Proximal Cleanser: Normal Saline (Generic) 3 x Per Week/30 Days Discharge Instructions: Wash your hands with soap and water. Remove old dressing, discard into plastic bag and place into trash. Cleanse the wound with Normal Saline prior to applying a clean dressing using gauze sponges, not tissues or cotton balls. Do not scrub or use excessive force. Pat dry using gauze sponges,  not tissue or cotton balls. Primary Dressing: Prisma 4.34 (in) 3 x Per Week/30 Days Discharge Instructions: Moisten with hydrogel Primary Dressing: Curad Oil Emulsion Dressing 3x3 (in/in) 3 x Per Week/30 Days Discharge Instructions: Apply on top of prisma Secondary Dressing: ABD Pad 5x9 (in/in) (Generic) 3 x Per Week/30 Days Discharge Instructions: Cover with ABD pad Secured With: 76M Medipore H Soft Cloth Surgical Tape, 2x2 (in/yd) (Generic) 3 x Per Week/30 Days Discharge Instructions: Secure gauze bandage Secured With: Kerlix Roll Sterile or Non-Sterile 6-ply 4.5x4 (yd/yd) (DME) (Generic) 3 x Per Week/30 Days Discharge Instructions: Apply Kerlix as directed Wound #9 - Lower Leg Wound Laterality: Right, Medial Cleanser: Normal Saline 3 x Per Week/30 Days Discharge Instructions: Wash your  hands with soap and water. Remove old dressing, discard into plastic bag and place into trash. Cleanse the wound with Normal Saline prior to applying a clean dressing using gauze sponges, not tissues or cotton balls. Do not scrub or use excessive force. Pat dry using gauze sponges, not tissue or cotton balls. Primary Dressing: Curad Oil Emulsion Dressing 3x3 (in/in) 3 x Per Week/30 Days Discharge Instructions: Apply directly on wound bed Secondary Dressing: ABD Pad 5x9 (in/in) (DME) (Generic) 3 x Per Week/30 Days Discharge Instructions: Cover with ABD pad Secured With: Kerlix Roll Sterile or Non-Sterile 6-ply 4.5x4 (yd/yd) 3 x Per Week/30 Days Discharge Instructions: Apply Kerlix as directed Secured With: Tubigrip Size E, 3.5x10 (in/yds) 3 x Per Week/30 Days Discharge Instructions: Apply 3 Tubigrip E 3-finger-widths below knee to base of toes to secure dressing and/or for swelling. Patient Medications Allergies: PCN Notifications Medication Indication Start End Bactrim DS 10/19/2020 DOSE 1 - oral 800 mg-160 mg tablet - 1 tablet oral taken 2 times per day for 14 days Electronic Signature(s) Signed:  10/19/2020 9:28:47 AM By: Worthy Keeler PA-C Entered By: Worthy Keeler on 10/19/2020 09:28:44 Ralph Peterson ([name] ([id number]) -------------------------------------------------------------------------------- Problem List Details Patient Name: Ralph Peterson. Date of Service: 10/19/2020 8:00 AM Medical Record Number: [id number] Patient Account Number: [id number] Date of Birth/Sex: February 04, 1966 (55 y.o. M) Treating RN: Dolan Amen Primary Care Provider: Vidal Schwalbe Other Clinician: Jeanine Luz Referring Provider: Vidal Schwalbe Treating Provider/Extender: Skipper Cliche in Treatment: 25 Active Problems ICD-10 Encounter Code Description Active Date MDM Diagnosis L89.893 Pressure ulcer of other site, stage 3 04/27/2020 No Yes T81.31XA Disruption of external operation (surgical) wound, not elsewhere 04/27/2020 No Yes classified, initial encounter L97.512 Non-pressure chronic ulcer of other part of right foot with fat layer 04/27/2020 No Yes exposed L97.812 Non-pressure chronic ulcer of other part of right lower leg with fat layer 04/27/2020 No Yes exposed E11.621 Type 2 diabetes mellitus with foot ulcer 04/27/2020 No Yes I50.42 Chronic combined systolic (congestive) and diastolic (congestive) heart 04/27/2020 No Yes failure N18.6 End stage renal disease 04/27/2020 No Yes Z99.2 Dependence on renal dialysis 04/27/2020 No Yes Inactive Problems Resolved Problems Electronic Signature(s) Signed: 10/19/2020 8:28:04 AM By: Worthy Keeler PA-C Entered By: Worthy Keeler on 10/19/2020 08:28:04 Ralph Peterson ([id number]) -------------------------------------------------------------------------------- Progress Note Details Patient Name: Ralph Peterson. Date of Service: 10/19/2020 8:00 AM Medical Record Number: [id number] Patient Account Number: [id number] Date of Birth/Sex: 08-May-1966 (55 y.o. 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 With regard to the plantar wound this is still draining purulent drainage unfortunately the culture did reveal that he had Pseudomonas as well as Enterococcus. With that being said he is allergic to penicillin which took away the only oral medication for the Enterococcus I did look at linezolid as a possibility but unfortunately there were medication interactions. Also the same was true the Cipro where there were medication interactions. Nonetheless I want to see if we can get him into infectious disease to see if they  can work with him to try to find possibly add at home IV antibiotic regimen that can help out at this point. No sharp debridement is can be necessary today. 09/07/2020 upon evaluation today patient appears to be doing unfortunately not too well in regard to his wound. He has been tolerating the dressing changes without complication. Fortunately there is no sign of active infection at this time. No fevers, chills, nausea, vomiting, or diarrhea. The patient did have a repeat surgery going to clean out the abscess and try to work on getting things moving in a better direction about a week and a half ago. He still has sutures in place. With that being said unfortunately he does not appear to be showing signs of a lot of improvement in my opinion. He does have sutures on the plantar aspect still in place and on the dorsal aspect he has  a small opening which actually probes all the way down through and actually came out of the plantar aspect. I feel like that he really is having a lot of issues here and I am afraid that if we do not get this taken care of this is can end up with him having a much larger and more extensive amputation than what he was hoping to have to deal with with this. It has already been since June 2021 but has been dealing with this including all the time since the amputation. 09/14/2020 upon evaluation today patient appears to be doing really about the same in regard to the wound on the dorsal surface of his foot. The plantar foot unfortunately still has sutures in place I really cannot see how things are doing in that regard. With the dorsal foot however this appears to show signs of still having a small opening in the distal aspect which does actually probe down to bone there is also significant purulent drainage we did actually obtain a wound culture today to further evaluate the issue here. 09/21/2020 upon evaluation today patient appears to be doing about the same in regard to his wound on the dorsal surface of his foot and the plantar foot he still does not have the sutures out at this point. That should hopefully happen in the next week. With that being said the main issue is he continues to have drainage here. It was noted that he had positive findings on culture for Enterobacter. With that being said he cannot take oral penicillin he could do vancomycin and I think that is good to be the ideal thing this to see if we can get him started on IV vancomycin at dialysis. We will get a get in touch with them to try to see if we can coordinate that being done there at the dialysis center. I think that is the ideal situation at this point I would probably start with just 2 weeks of therapy. 09/28/2020 upon evaluation today patient appears to be doing well with regard to his wounds in general. He also has back on the  vancomycin after the culture results and this seems to be doing excellent for him. All the wounds are showing signs of good improvement. No fevers, chills, nausea, vomiting, or diarrhea. 10/05/2020 upon evaluation today patient appears to actually be doing excellent in regard to his foot. Fortunately there is a lot of signs of new granulation epithelization at this point. There does not appear to be any evidence of active infection which is great news and overall very pleased with where things  stand. In fact the open wound along the plantar aspect of his foot as well as the dorsal part of his foot has dramatically improved since we went forward with the vancomycin and requested that the dialysis center provide this to him by way of IV antibiotics with his dialysis. Overall I think that has made a dramatic improvement and overall his wound seems to be doing significantly better at this point. No fevers, chills, nausea, vomiting, or diarrhea. The patient was set to have his second opinion appointment with podiatry [facility] next week but to be perfectly honest I am not even certain that that is necessary at this point I do not think there is much that they would be able to do for him nor that he really would want them to do considering how well things look at this time. 10/12/2020 on evaluation today patient appears to be doing excellent in regard to his wounds. In fact the only issue I see is that everything is dried out as far as the collagen is concerned. I do think we can try to keep this moist he will continue to make excellent progress as he has been. Overall I am extremely pleased with what I see today. 10/19/2020 upon evaluation today patient appears to be doing decently well in regard to the wounds on his foot he is can require some debridement in this location. Unfortunately is having some issues with blistering over the leg which is of greater concern at this point. Fortunately I do not see any signs  of active infection which is great news. Nonetheless the blistering may just be related to venous insufficiency or subsequently could potentially be an issue with overall worsening with regard to an infection but again it is not really hot to touch. Ralph Peterson, Ralph Peterson ([id number]) Objective Constitutional Well-nourished and well-hydrated in no acute distress. Vitals Time Taken: 8:15 AM, Height: 72 in, Weight: 225 lbs, BMI: 30.5, Temperature: 98.3 F, Pulse: 74 bpm, Respiratory Rate: 18 breaths/min, Blood Pressure: 131/79 mmHg. Respiratory normal breathing without difficulty. Psychiatric this patient is able to make decisions and demonstrates good insight into disease process. Alert and Oriented x 3. pleasant and cooperative. General Notes: Patient's wounds all had some of the collagen stuck to them despite the use of the hydrogel. I think would be 1 using oil emulsion dressing over top of this in order to try to help with management of these wounds and preventing them from drying out too much. With that being said I did perform debridement clear away all the necrotic debris including the leftover dressing material as well as eschar and slough from the surface of the wounds all the wounds appear to be doing significantly better which is great news. Integumentary (Hair, Skin) Wound #1 status is Open. Original cause of wound was Pressure Injury. The date acquired was: 02/20/2020. The wound has been in treatment 25 weeks. The wound is located on the Right,Distal,Lateral Foot. The wound measures 0.5cm length x 1cm width x 0.2cm depth; 0.393cm^2 area and 0.079cm^3 volume. There is Fat Layer (Subcutaneous Tissue) exposed. There is no tunneling or undermining noted. There is a small amount of serous drainage noted. The wound margin is distinct with the outline attached to the wound base. There is no granulation within the wound bed. There is a large (67-100%) amount of necrotic tissue within the wound  bed including Eschar. Wound #7 status is Open. Original cause of wound was Blister. The date acquired was: 09/05/2020. The wound has been  in treatment 6 weeks. The wound is located on the Right,Dorsal Foot. The wound measures 0.6cm length x 0.5cm width x 0.1cm depth; 0.236cm^2 area and 0.024cm^3 volume. There is Fat Layer (Subcutaneous Tissue) exposed. There is no tunneling or undermining noted. There is a none present amount of drainage noted. The wound margin is flat and intact. There is no granulation within the wound bed. There is a large (67-100%) amount of necrotic tissue within the wound bed including Eschar. Wound #8 status is Open. Original cause of wound was Gradually Appeared. The date acquired was: 09/07/2020. The wound has been in treatment 3 weeks. The wound is located on the Right,Proximal,Lateral Foot. The wound measures 0.5cm length x 0.5cm width x 0.2cm depth; 0.196cm^2 area and 0.039cm^3 volume. There is Fat Layer (Subcutaneous Tissue) exposed. There is no tunneling or undermining noted. There is a small amount of serous drainage noted. The wound margin is flat and intact. There is no granulation within the wound bed. There is a large (67-100%) amount of necrotic tissue within the wound bed including Eschar. Wound #9 status is Open. Original cause of wound was Blister. The date acquired was: 10/16/2020. The wound is located on the Right,Medial Lower Leg. The wound measures 9.9cm length x 5.6cm width x 0.1cm depth; 43.542cm^2 area and 4.354cm^3 volume. There is Fat Layer (Subcutaneous Tissue) exposed. There is no tunneling or undermining noted. There is a medium amount of sanguinous drainage noted. There is large (67-100%) red, pink granulation within the wound bed. There is no necrotic tissue within the wound bed. 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 There was a Excisional Skin/Subcutaneous Tissue Debridement with a total area of 0.5 sq cm performed by Tommie Sams., PA-C. With the following instrument(s): Curette to remove Non- Viable tissue/material. Material removed includes Eschar, Subcutaneous Tissue, and Skin: Epidermis after achieving pain control using Lidocaine 4% Topical Solution. A time out was conducted at 08:58, prior to the start of the procedure. A Minimum amount of bleeding was controlled with Cabal, Abhishek S. ([id number]) Pressure. The procedure was tolerated well. Post Debridement Measurements: 0.5cm length x 1cm width x 0.2cm depth; 0.079cm^3 volume. Post debridement Stage noted as Category/Stage IV. Character of Wound/Ulcer Post Debridement is improved. Post procedure Diagnosis Wound #1: Same as Pre-Procedure Wound #7 Pre-procedure diagnosis of Wound #7 is a Diabetic Wound/Ulcer of the Lower Extremity located on the Right,Dorsal Foot .Severity of Tissue Pre Debridement is: Fat layer exposed. There was a Selective/Open Wound Non-Viable Tissue Debridement with a total area of 0.3 sq cm performed by Tommie Sams., PA-C. With the following instrument(s): Curette to remove Non-Viable tissue/material. Material removed includes Eschar after achieving pain control using Lidocaine 4% Topical Solution. A time out was conducted at 08:55, prior to the start of the procedure. There was no bleeding. The procedure was tolerated well. Post Debridement Measurements: 0.6cm  length x 0.5cm width x 0.1cm depth; 0.024cm^3 volume. Character of Wound/Ulcer Post Debridement is improved. Severity of Tissue Post Debridement is: Fat layer  exposed. Post procedure Diagnosis Wound #7: Same as Pre-Procedure Wound #8 Pre-procedure diagnosis of Wound #8 is a Pressure Ulcer located on the Right,Proximal,Lateral Foot . There was a Excisional Skin/Subcutaneous Tissue Debridement with a total area of 0.25 sq cm performed by Tommie Sams., PA-C. With the following instrument(s): Curette to remove Non- Viable tissue/material. Material removed includes Eschar, Subcutaneous Tissue, and Skin: Epidermis after achieving pain control using Lidocaine 4% Topical Solution. A time out was conducted at 09:02, prior to the start of the procedure. A Minimum amount of bleeding was controlled with Pressure. The procedure was tolerated well. Post Debridement Measurements: 0.5cm length x 0.5cm width x 0.2cm depth; 0.039cm^3 volume. Post debridement Stage noted as Unstageable/Unclassified. Character of Wound/Ulcer Post Debridement is improved. Post procedure Diagnosis Wound #8: Same as Pre-Procedure Wound #9 Pre-procedure diagnosis of Wound #9 is a Diabetic Wound/Ulcer of the Lower Extremity located on the Right,Medial Lower Leg .Severity of Tissue Pre Debridement is: Fat layer exposed. There was a Chemical/Enzymatic/Mechanical debridement performed by Tommie Sams., PA-C. With the following instrument(s): gauze and saline. Other agent used was saline and gauze. A time out was conducted at 09:10, prior to the start of the procedure. There was no bleeding. The procedure was tolerated well. Post Debridement Measurements: 9.9cm length x 5.6cm width x 0.1cm depth; 4.354cm^3 volume. Character of Wound/Ulcer Post Debridement is stable. Severity of Tissue Post Debridement is: Fat layer exposed. Post procedure Diagnosis Wound #9: Same as Pre-Procedure Plan Follow-up Appointments: Return Appointment in 1  week. Bathing/ Shower/ Hygiene: Clean wound with Normal Saline or wound cleanser. Off-Loading: Open toe surgical shoe with peg assist. - Right foot The following medication(s) was prescribed: Bactrim DS oral 800 mg-160 mg tablet 1 1 tablet oral taken 2 times per day for 14 days starting 10/19/2020 WOUND #1: - Foot Wound Laterality: Right, Lateral, Distal Cleanser: Normal Saline (Generic) 3 x Per Week/30 Days Discharge Instructions: Wash your hands with soap and water. Remove old dressing, discard into plastic bag and place into trash. Cleanse the wound with Normal Saline prior to applying a clean dressing using gauze sponges, not tissues or cotton balls. Do not scrub or use excessive force. Pat dry using gauze sponges, not tissue or cotton balls. Primary Dressing: Prisma 4.34 (in) (DME) (Generic) 3 x Per Week/30 Days Discharge Instructions: Moisten with hydrogel Primary Dressing: Curad Oil Emulsion Dressing 3x3 (in/in) 3 x Per Week/30 Days Discharge Instructions: Apply on top of prisma Secondary Dressing: ABD Pad 5x9 (in/in) (Generic) 3 x Per Week/30 Days Discharge Instructions: Cover with ABD pad Secured With: 24M Medipore H Soft Cloth Surgical Tape, 2x2 (in/yd) (Generic) 3 x Per Week/30 Days Discharge Instructions: Secure gauze bandage Secured With: Kerlix Roll Sterile or Non-Sterile 6-ply 4.5x4 (yd/yd) 3 x Per Week/30 Days Discharge Instructions: Apply Kerlix as directed WOUND #7: - Foot Wound Laterality: Dorsal, Right Cleanser: Normal Saline (Generic) 3 x Per Week/30 Days Discharge Instructions: Wash your hands with soap and water. Remove old dressing, discard into plastic bag and place into trash. Cleanse the wound with Normal Saline prior to applying a clean dressing using gauze sponges, not tissues or cotton balls. Do not scrub or use excessive force. Pat dry using gauze sponges, not tissue or cotton balls. Primary Dressing: Prisma 4.34 (in) 3 x Per Week/30 Days Discharge  Instructions: Moisten with hydrogel Primary Dressing: Curad Oil Emulsion Dressing 3x3 (in/in) 3 x Per Week/30 Days Discharge Instructions: Apply on top of prisma Secondary Dressing: ABD Pad 5x9 (in/in) (Generic) 3  x Per Week/30 Days Discharge Instructions: Cover with ABD pad Ralph Peterson, Ralph Peterson ([id number]) Secured With: 67M Medipore H Soft Cloth Surgical Tape, 2x2 (in/yd) (Generic) 3 x Per Week/30 Days Discharge Instructions: Secure gauze bandage Secured With: Kerlix Roll Sterile or Non-Sterile 6-ply 4.5x4 (yd/yd) 3 x Per Week/30 Days Discharge Instructions: Apply Kerlix as directed WOUND #8: - Foot Wound Laterality: Right, Lateral, Proximal Cleanser: Normal Saline (Generic) 3 x Per Week/30 Days Discharge Instructions: Wash your hands with soap and water. Remove old dressing, discard into plastic bag and place into trash. Cleanse the wound with Normal Saline prior to applying a clean dressing using gauze sponges, not tissues or cotton balls. Do not scrub or use excessive force. Pat dry using gauze sponges, not tissue or cotton balls. Primary Dressing: Prisma 4.34 (in) 3 x Per Week/30 Days Discharge Instructions: Moisten with hydrogel Primary Dressing: Curad Oil Emulsion Dressing 3x3 (in/in) 3 x Per Week/30 Days Discharge Instructions: Apply on top of prisma Secondary Dressing: ABD Pad 5x9 (in/in) (Generic) 3 x Per Week/30 Days Discharge Instructions: Cover with ABD pad Secured With: 67M Medipore H Soft Cloth Surgical Tape, 2x2 (in/yd) (Generic) 3 x Per Week/30 Days Discharge Instructions: Secure gauze bandage Secured With: Kerlix Roll Sterile or Non-Sterile 6-ply 4.5x4 (yd/yd) (DME) (Generic) 3 x Per Week/30 Days Discharge Instructions: Apply Kerlix as directed WOUND #9: - Lower Leg Wound Laterality: Right, Medial Cleanser: Normal Saline 3 x Per Week/30 Days Discharge Instructions: Wash your hands with soap and water. Remove old dressing, discard into plastic bag and place into  trash. Cleanse the wound with Normal Saline prior to applying a clean dressing using gauze sponges, not tissues or cotton balls. Do not scrub or use excessive force. Pat dry using gauze sponges, not tissue or cotton balls. Primary Dressing: Curad Oil Emulsion Dressing 3x3 (in/in) 3 x Per Week/30 Days Discharge Instructions: Apply directly on wound bed Secondary Dressing: ABD Pad 5x9 (in/in) (DME) (Generic) 3 x Per Week/30 Days Discharge Instructions: Cover with ABD pad Secured With: Kerlix Roll Sterile or Non-Sterile 6-ply 4.5x4 (yd/yd) 3 x Per Week/30 Days Discharge Instructions: Apply Kerlix as directed Secured With: Tubigrip Size E, 3.5x10 (in/yds) 3 x Per Week/30 Days Discharge Instructions: Apply 3 Tubigrip E 3-finger-widths below knee to base of toes to secure dressing and/or for swelling. 1. Would recommend currently that we continue with the wound care measures as before and the patient is in agreement with the plan that includes the use of the silver collagen followed by hydrogel and then covered with oil emulsion dressings over the foot wounds. 2. With regard to the leg ulcerations I would recommend just the oil emulsion dressing followed by ABD pad and roll gauze secured with Tubigrip to help with edema control. 3. I am also can recommend the patient continue to monitor for any signs of worsening infection. And to that end I am to give him a prescription for Bactrim DS as well to help prevent infection. The patient is in agreement with the plan. We will see patient back for reevaluation in 1 week here in the clinic. If anything worsens or changes patient will contact our office for additional recommendations. Electronic Signature(s) Signed: 10/19/2020 9:29:06 AM By: Worthy Keeler PA-C Previous Signature: 10/19/2020 9:21:36 AM Version By: Worthy Keeler PA-C Entered By: Worthy Keeler on 10/19/2020 09:29:06 Ralph Peterson  ([id number]) -------------------------------------------------------------------------------- SuperBill Details Patient Name: Ralph Peterson Date of Service: 10/19/2020 Medical Record Number: [id number] Patient Account Number: [id number] Date of  Birth/Sex: 1965-09-06 (55 y.o. M) Treating RN: Dolan Amen Primary Care Provider: Vidal Schwalbe Other Clinician: Jeanine Luz Referring Provider: Vidal Schwalbe Treating Provider/Extender: Skipper Cliche in Treatment: 25 Diagnosis Coding ICD-10 Codes Code Description [phone] Pressure ulcer of other site, stage 3 T81.31XA Disruption of external operation (surgical) wound, not elsewhere classified, initial encounter 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d L97.812 Non-pressure chronic ulcer of other part of right lower leg with fat layer exposed E11.621 Type 2 diabetes mellitus with foot ulcer I50.42 Chronic combined systolic (congestive) and diastolic (congestive) heart failure N18.6 End stage renal disease Z99.2 Dependence on renal dialysis Facility Procedures CPT4 Code: [id number] Description: 11042 - DEB SUBQ TISSUE 20 SQ CM/< Modifier: Quantity: 1 CPT4 Code: Description: ICD-10 Diagnosis Description L89.893 Pressure ulcer of other site, stage 3 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 Modifier: sed Quantity: CPT4 Code: [id number] Description: [id number] - DEBRIDE WOUND 1ST 20 SQ CM OR < Modifier: Quantity: 1 CPT4 Code: Description: ICD-10 Diagnosis Description 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 Modifier: sed Quantity: Physician Procedures CPT4 [id number] Description: 99214 - WC PHYS LEVEL 4 - EST PT Modifier: 25 Quantity: 1 CPT4 Code: Description: ICD-10 Diagnosis Description L89.893 Pressure ulcer of other site, stage 3 T81.31XA Disruption of external operation (surgical) wound, not  elsewhere classified, L97.512 Non-pressure chronic ulcer of other part of right foot with fat layer  expose L97.812 Non-pressure chronic ulcer of other part of right lower leg with fat layer e Modifier: initial encounter d xposed Quantity: CPT4 Code: [id number] Description: 11042 - WC PHYS SUBQ TISS 20 SQ CM Modifier: Quantity: 1 CPT4 Code: Description: ICD-10 Diagnosis Description L89.893 Pressure ulcer of other site, stage 3 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 Modifier: d Quantity: CPT4 Code: [id number] Description: [id number] - WC PHYS DEBR WO ANESTH 20 SQ CM Modifier: Quantity: 1 CPT4 Code: Description: ICD-10 Diagnosis Description [id number] Non-pressure chronic ulcer of other part of right foot with fat layer expose Modifier: d Quantity: Electronic Signature(s) Signed: 10/19/2020 9:26:11 AM By: Worthy Keeler PA-C Entered By: Worthy Keeler on 10/19/2020 09:26:11

## 2020-10-26 ENCOUNTER — Other Ambulatory Visit: Payer: Self-pay

## 2020-10-26 ENCOUNTER — Encounter: Payer: Medicare PPO | Attending: Physician Assistant | Admitting: Physician Assistant

## 2020-10-26 DIAGNOSIS — E1151 Type 2 diabetes mellitus with diabetic peripheral angiopathy without gangrene: Secondary | ICD-10-CM | POA: Insufficient documentation

## 2020-10-26 DIAGNOSIS — I5042 Chronic combined systolic (congestive) and diastolic (congestive) heart failure: Secondary | ICD-10-CM | POA: Insufficient documentation

## 2020-10-26 DIAGNOSIS — L97812 Non-pressure chronic ulcer of other part of right lower leg with fat layer exposed: Secondary | ICD-10-CM | POA: Insufficient documentation

## 2020-10-26 DIAGNOSIS — I2510 Atherosclerotic heart disease of native coronary artery without angina pectoris: Secondary | ICD-10-CM | POA: Insufficient documentation

## 2020-10-26 DIAGNOSIS — Z992 Dependence on renal dialysis: Secondary | ICD-10-CM | POA: Diagnosis not present

## 2020-10-26 DIAGNOSIS — E1140 Type 2 diabetes mellitus with diabetic neuropathy, unspecified: Secondary | ICD-10-CM | POA: Diagnosis not present

## 2020-10-26 DIAGNOSIS — L97512 Non-pressure chronic ulcer of other part of right foot with fat layer exposed: Secondary | ICD-10-CM | POA: Diagnosis not present

## 2020-10-26 DIAGNOSIS — L89893 Pressure ulcer of other site, stage 3: Secondary | ICD-10-CM | POA: Diagnosis not present

## 2020-10-26 DIAGNOSIS — E1122 Type 2 diabetes mellitus with diabetic chronic kidney disease: Secondary | ICD-10-CM | POA: Insufficient documentation

## 2020-10-26 DIAGNOSIS — E11621 Type 2 diabetes mellitus with foot ulcer: Secondary | ICD-10-CM | POA: Diagnosis not present

## 2020-10-26 DIAGNOSIS — N186 End stage renal disease: Secondary | ICD-10-CM | POA: Diagnosis not present

## 2020-10-26 DIAGNOSIS — I132 Hypertensive heart and chronic kidney disease with heart failure and with stage 5 chronic kidney disease, or end stage renal disease: Secondary | ICD-10-CM | POA: Diagnosis not present

## 2020-10-26 NOTE — Progress Notes (Addendum)
ELIU, BOEGEL ([id number]) Visit Report for 10/26/2020 Chief Complaint Document Details Patient Name: Ralph Peterson, Ralph Peterson. Date of Service: 10/26/2020 1:30 PM Medical Record Number: [id number] Patient Account Number: [id number] Date of Birth/Sex: September 16, 1965 (55 y.o. 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 M) Treating RN: Dolan Amen Primary Care Provider: Vidal Schwalbe Other Clinician: Jeanine Luz Referring Provider: Vidal Schwalbe Treating Provider/Extender: Skipper Cliche in Treatment: 26 Debridement Performed for Wound #1 Right,Distal,Lateral Foot Assessment: Performed By: Physician Tommie Sams., PA-C Debridement Type: Debridement Level of Consciousness (Pre- Awake and Alert procedure): Pre-procedure Verification/Time Out Yes - 14:55 Taken: Start Time: 14:55 Total Area Debrided (L x W): 0.5 (cm) x 1 (cm) = 0.5 (cm) Tissue and other material Viable, Non-Viable, Callus, Slough, Subcutaneous, Slough debrided: Level: Skin/Subcutaneous Tissue Debridement Description: Excisional Instrument: Curette Bleeding: Minimum Hemostasis Achieved: Pressure Response to Treatment: Procedure was tolerated well Level of Consciousness  (Post- Awake and Alert procedure): Post Debridement Measurements of Total Wound Length: (cm) 0.5 Stage: Category/Stage IV Width: (cm) 1 Depth: (cm) 0.3 Volume: (cm) 0.118 Character of Wound/Ulcer Post Debridement: Stable Post Procedure Diagnosis Same as Pre-procedure Electronic Signature(s) Signed: 10/26/2020 5:35:33 PM By: Charlett Nose RN Signed: 10/26/2020 6:06:53 PM By: Worthy Keeler PA-C Entered By: Georges Mouse, Minus Breeding on 10/26/2020 14:56:24 Ralph Peterson ([id number]) -------------------------------------------------------------------------------- Debridement Details Patient Name: Ralph Peterson. Date of Service: 10/26/2020 1:30 PM Medical Record Number: [id number] Patient Account Number: [id number] Date of Birth/Sex: 1965-11-30 (55 y.o. M) Treating RN: Dolan Amen Primary Care Provider: Vidal Schwalbe Other Clinician: Jeanine Luz Referring Provider: Vidal Schwalbe Treating Provider/Extender: Skipper Cliche in Treatment: 26 Debridement Performed for Wound #8 Right,Proximal,Lateral Foot Assessment: Performed By: Physician Tommie Sams., PA-C Debridement Type: Debridement Level of Consciousness (Pre- Awake and Alert procedure): Pre-procedure Verification/Time Out Yes - 14:55 Taken: Start Time: 14:55 Total Area Debrided (L x W): 0.4 (cm) x 0.3 (cm) = 0.12 (cm) Tissue and other material Viable, Non-Viable, Callus, Slough, Subcutaneous, Slough debrided: Level: Skin/Subcutaneous Tissue Debridement Description: Excisional Instrument: Curette Bleeding: Minimum Hemostasis Achieved: Pressure Response to Treatment: Procedure was tolerated well Level of Consciousness (Post- Awake and Alert procedure): Post Debridement Measurements of Total Wound Length: (cm) 0.4 Stage: Unstageable/Unclassified Width: (cm) 0.3 Depth: (cm) 0.3 Volume: (cm) 0.028 Character of Wound/Ulcer Post Debridement: Stable Post Procedure Diagnosis Same as  Pre-procedure Electronic Signature(s) Signed: 10/26/2020 5:35:33 PM By: Charlett Nose RN Signed: 10/26/2020 6:06:53 PM By: Worthy Keeler PA-C Entered By: Georges Mouse, Minus Breeding on 10/26/2020 14:56:41 Ralph Peterson ([id number]) -------------------------------------------------------------------------------- HPI Details Patient Name: Ralph Peterson. Date of Service: 10/26/2020 1:30 PM Medical Record Number: [id number] Patient Account Number: [id number] Date of Birth/Sex: [birth year] (55 y.o. M) Treating RN: Dolan Amen Primary Care Provider: Vidal Schwalbe Other Clinician: Jeanine Luz Referring Provider: Vidal Schwalbe Treating Provider/Extender: Skipper Cliche in Treatment: 26 History of Present Illness HPI Description: 08/17/2020 upon evaluation today patient actually appears to be doing decently well today which is good news in  regard to the dorsal wound which is almost healed and the lateral wound also doing much better. With regard to the plantar wound this is still draining purulent drainage unfortunately the culture did reveal that he had Pseudomonas as well as Enterococcus. With that being said he is allergic to penicillin which took away the only oral medication for the Enterococcus I did look at linezolid as a possibility but unfortunately there were medication interactions. Also the same was true the Cipro where there were medication interactions. Nonetheless I want to see if we can get him into infectious disease to see if they can work with him to try to find possibly add at home IV antibiotic regimen that can help out at this point. No sharp debridement is can be necessary today. 09/07/2020 upon evaluation today patient appears to be doing unfortunately not too well in regard to his wound. He has been tolerating the dressing changes without complication. Fortunately there is no sign of active infection at this time. No fevers, chills, nausea, vomiting,  or diarrhea. The patient did have a repeat surgery going to clean out the abscess and try to work on getting things moving in a better direction about a week and a half ago. He still has sutures in place. With that being said unfortunately he does not appear to be showing signs of a lot of improvement in my opinion. He does have sutures on the plantar aspect still in place and on the dorsal aspect he has a small opening which actually probes all the way down through and actually came out of the plantar aspect. I feel like that he really is having a lot of issues here and I am afraid that if we do not get this taken care of this is can end up with him having a much larger and more extensive amputation than what he was hoping to have to deal with with this. It has already been since June 2021 but has been dealing with this including all the time since the amputation. 09/14/2020 upon evaluation today patient appears to be doing really about the same in regard to the wound on the dorsal surface of his foot. The plantar foot unfortunately still has sutures in place I really cannot see how things are doing in that regard. With the dorsal foot however this appears to show signs of still having a small opening in the distal aspect which does actually probe down to bone there is also significant purulent drainage we did actually obtain a wound culture today to further evaluate the issue here. 09/21/2020 upon evaluation today patient appears to be doing about the same in regard to his wound on the dorsal surface of his foot and the plantar foot he still does not have the sutures out at this point. That should hopefully happen in the next week. With that being said the main issue is he continues to have drainage here. It was noted that he had positive findings on culture for Enterobacter. With that being said he cannot take oral penicillin he could do vancomycin and I think that is good to be the ideal thing this to  see if we can get him started on IV vancomycin at dialysis. We will get a get in touch with them to try to see if we can coordinate that being done there at the dialysis center. I think that is the ideal situation at this point I would probably start with just 2 weeks of therapy. 09/28/2020  upon evaluation today patient appears to be doing well with regard to his wounds in general. He also has back on the vancomycin after the culture results and this seems to be doing excellent for him. All the wounds are showing signs of good improvement. No fevers, chills, nausea, vomiting, or diarrhea. 10/05/2020 upon evaluation today patient appears to actually be doing excellent in regard to his foot. Fortunately there is a lot of signs of new granulation epithelization at this point. There does not appear to be any evidence of active infection which is great news and overall very pleased with where things stand. In fact the open wound along the plantar aspect of his foot as well as the dorsal part of his foot has dramatically improved since we went forward with the vancomycin and requested that the dialysis center provide this to him by way of IV antibiotics with his dialysis. Overall I think that has made a dramatic improvement and overall his wound seems to be doing significantly better at this point. No fevers, chills, nausea, vomiting, or diarrhea. The patient was set to have his second opinion appointment with podiatry [facility] next week but to be perfectly honest I am not even certain that that is necessary at this point I do not think there is much that they would be able to do for him nor that he really would want them to do considering how well things look at this time. 10/12/2020 on evaluation today patient appears to be doing excellent in regard to his wounds. In fact the only issue I see is that everything is dried out as far as the collagen is concerned. I do think we can try to keep this moist he will  continue to make excellent progress as he has been. Overall I am extremely pleased with what I see today. 10/19/2020 upon evaluation today patient appears to be doing decently well in regard to the wounds on his foot he is can require some debridement in this location. Unfortunately is having some issues with blistering over the leg which is of greater concern at this point. Fortunately I do not see any signs of active infection which is great news. Nonetheless the blistering may just be related to venous insufficiency or subsequently could potentially be an issue with overall worsening with regard to an infection but again it is not really hot to touch. 10/26/2020 upon evaluation today patient appears to be doing well with regard to the majority of his wounds. Unfortunately the dorsal surface of his foot does not appear to be doing quite as well today compared to what it has been doing. I do think that we will need to address that currently. Everything else is showing signs of improvement. He did not get the prescription that I gave him previous as unfortunately the transmission failed. I did not know that until today when the patient mention to me that he never got the antibiotic. Electronic Signature(s) Signed: 10/26/2020 4:24:04 PM By: Worthy Keeler PA-C Entered By: Worthy Keeler on 10/26/2020 16:24:04 Ralph Peterson ([id number]) -------------------------------------------------------------------------------- Physical Exam Details Patient Name: Ralph Peterson Date of Service: 10/26/2020 1:30 PM Medical Record Number: [id number] Patient Account Number: [id number] Date of Birth/Sex: December 26, 1965 (54 y.o. M) Treating RN: Dolan Amen Primary Care Provider: Vidal Schwalbe Other Clinician: Jeanine Luz Referring Provider: Vidal Schwalbe Treating Provider/Extender: Skipper Cliche in Treatment: 65 Constitutional Well-nourished and well-hydrated in no acute  distress. Respiratory normal breathing without difficulty. Cardiovascular no clubbing, cyanosis,  significant edema, <3 sec cap refill. Psychiatric this patient is able to make decisions and demonstrates good insight into disease process. Alert and Oriented x 3. pleasant and cooperative. Notes Upon inspection patient's wound bed actually showed signs of good granulation epithelization at this point over the majority of the wounds. Especially the lateral foot wounds both are doing excellent. His leg ulcerations are also doing quite well he has a deep tissue injury/blood blister on the heel which I think needs to be protected but otherwise is not actually open at this point which is good news. The worst issue currently is actually the dorsal surface of his foot distally which has reopened this is the surgical site. I did not perform debridement here to clear away some of the surface necrotic tissue where it was leaking through and then subsequently was able to clean away the cap that was hiding the opening. Subsequently there is a area of depth that is around 1.5 cm total by the end of all this. This is quite unfortunate we were hoping that that would be doing well at this point. Electronic Signature(s) Signed: 10/26/2020 4:25:12 PM By: Worthy Keeler PA-C Entered By: Worthy Keeler on 10/26/2020 16:25:12 Ralph Peterson ([id number]) -------------------------------------------------------------------------------- Physician Orders Details Patient Name: Ralph Peterson Date of Service: 10/26/2020 1:30 PM Medical Record Number: [id number] Patient Account Number: [id number] Date of Birth/Sex: 30-May-1966 (55 y.o. 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 Bathing/ Shower/ Hygiene o Clean wound with Normal Saline or wound cleanser. Off-Loading o Open toe surgical shoe with peg assist. - Right foot o Other: - Betadine on heel and gauze, wear socks for protection Wound Treatment Wound #1 - Foot Wound Laterality: Right, Lateral, Distal Cleanser: Normal Saline (Generic) 3 x Per Week/30 Days Discharge Instructions: Wash your hands with soap and water. Remove old dressing, discard into plastic bag and place into trash. Cleanse the wound with Normal Saline prior to applying a clean dressing using gauze sponges, not tissues or cotton balls. Do not scrub or use excessive force. Pat dry using gauze sponges, not tissue or cotton balls. Primary Dressing: Prisma 4.34 (in) (Generic) 3 x Per Week/30 Days Discharge Instructions: Moisten with hydrogel Primary Dressing: Curad Oil Emulsion Dressing 3x3 (in/in) 3 x Per Week/30 Days Discharge Instructions: Apply on top of prisma Secondary Dressing: ABD Pad 5x9 (in/in) (Generic) 3 x Per Week/30 Days Discharge Instructions: Cover with ABD pad Secured With: 62M Medipore H Soft Cloth Surgical Tape, 2x2 (in/yd) (Generic) 3 x Per Week/30 Days Discharge Instructions: Secure gauze bandage Secured With: Kerlix Roll Sterile or Non-Sterile 6-ply 4.5x4 (yd/yd) 3 x Per Week/30 Days Discharge Instructions: Apply Kerlix as  directed Wound #2R - Foot Wound Laterality: Dorsal, Right, Distal Cleanser: Normal Saline (Generic) 3 x Per Week/30 Days Discharge Instructions: Wash your hands with soap  and water. Remove old dressing, discard into plastic bag and place into trash. Cleanse the wound with Normal Saline prior to applying a clean dressing using gauze sponges, not tissues or cotton balls. Do not scrub or use excessive force. Pat dry using gauze sponges, not tissue or cotton balls. Primary Dressing: Hydrofera Blue Classic Foam Rope Dressing, 9x6 (mm/in) 3 x Per Week/30 Days Discharge Instructions: Cut rope in half, 2 cm in length-pack in wound, moisten with a couple drops of saline after inserted (extras sent with patient) Ralph Peterson, Ralph ([id number]) Secondary Dressing: ABD Pad 5x9 (in/in) (Generic) 3 x Per Week/30 Days Discharge Instructions: Cover with ABD pad Secured With: 16M Medipore H Soft Cloth Surgical Tape, 2x2 (in/yd) (Generic) 3 x Per Week/30 Days Discharge Instructions: Secure gauze bandage Secured With: Kerlix Roll Sterile or Non-Sterile 6-ply 4.5x4 (yd/yd) 3 x Per Week/30 Days Discharge Instructions: Apply Kerlix as directed Wound #8 - Foot Wound Laterality: Right, Lateral, Proximal Cleanser: Normal Saline (Generic) 3 x Per Week/30 Days Discharge Instructions: Wash your hands with soap and water. Remove old dressing, discard into plastic bag and place into trash. Cleanse the wound with Normal Saline prior to applying a clean dressing using gauze sponges, not tissues or cotton balls. Do not scrub or use excessive force. Pat dry using gauze sponges, not tissue or cotton balls. Primary Dressing: Prisma 4.34 (in) 3 x Per Week/30 Days Discharge Instructions: Moisten with hydrogel Primary Dressing: Curad Oil Emulsion Dressing 3x3 (in/in) 3 x Per Week/30 Days Discharge Instructions: Apply on top of prisma Secondary Dressing: ABD Pad 5x9 (in/in) (Generic) 3 x Per Week/30 Days Discharge Instructions:  Cover with ABD pad Secured With: 16M Medipore H Soft Cloth Surgical Tape, 2x2 (in/yd) (Generic) 3 x Per Week/30 Days Discharge Instructions: Secure gauze bandage Secured With: Kerlix Roll Sterile or Non-Sterile 6-ply 4.5x4 (yd/yd) (Generic) 3 x Per Week/30 Days Discharge Instructions: Apply Kerlix as directed Wound #9 - Lower Leg Wound Laterality: Right, Medial Cleanser: Normal Saline 3 x Per Week/30 Days Discharge Instructions: Wash your hands with soap and water. Remove old dressing, discard into plastic bag and place into trash. Cleanse the wound with Normal Saline prior to applying a clean dressing using gauze sponges, not tissues or cotton balls. Do not scrub or use excessive force. Pat dry using gauze sponges, not tissue or cotton balls. Primary Dressing: Curad Oil Emulsion Dressing 3x3 (in/in) 3 x Per Week/30 Days Discharge Instructions: Apply directly on wound bed Secondary Dressing: ABD Pad 5x9 (in/in) (Generic) 3 x Per Week/30 Days Discharge Instructions: Cover with ABD pad Patient Medications Allergies: PCN Notifications Medication Indication Start End Bactrim DS 10/26/2020 DOSE 1 - oral 800 mg-160 mg tablet - 1 tablet oral taken 2 times per day for 14 days Electronic Signature(s) Signed: 10/26/2020 4:27:08 PM By: Worthy Keeler PA-C Entered By: Worthy Keeler on 10/26/2020 16:27:07 Ralph Peterson ([id number]) -------------------------------------------------------------------------------- Problem List Details Patient Name: Ralph Peterson Date of Service: 10/26/2020 1:30 PM Medical Record Number: [id number] Patient Account Number: [id number] Date of Birth/Sex: Dec 18, 1965 (55 y.o. M) Treating RN: Dolan Amen Primary Care Provider: Vidal Schwalbe Other Clinician: Jeanine Luz Referring Provider: Vidal Schwalbe Treating Provider/Extender: Skipper Cliche in Treatment: 26 Active Problems ICD-10 Encounter Code Description Active Date MDM Diagnosis L89.893  Pressure ulcer of other site, stage 3 04/27/2020 No Yes T81.31XA Disruption of external operation (surgical) wound, not elsewhere 04/27/2020 No Yes classified, initial encounter L97.512 Non-pressure chronic ulcer of other part of right foot with fat layer  04/27/2020 No Yes exposed L97.812 Non-pressure chronic ulcer of other part of right lower leg with fat layer 04/27/2020 No Yes exposed E11.621 Type 2 diabetes mellitus with foot ulcer 04/27/2020 No Yes I50.42 Chronic combined systolic (congestive) and diastolic (congestive) heart 04/27/2020 No Yes failure N18.6 End stage renal disease 04/27/2020 No Yes Z99.2 Dependence on renal dialysis 04/27/2020 No Yes Inactive Problems Resolved Problems Electronic Signature(s) Signed: 10/26/2020 2:47:39 PM By: Worthy Keeler PA-C Entered By: Worthy Keeler on 10/26/2020 14:47:38 Ralph Peterson ([id number]) -------------------------------------------------------------------------------- Progress Note Details Patient Name: Ralph Peterson. Date of Service: 10/26/2020 1:30 PM Medical Record Number: [id number] Patient Account Number: [id number] Date of Birth/Sex: 1966/05/10 (55 y.o. 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 With regard to the plantar wound this is still draining purulent drainage unfortunately the culture did reveal that he had Pseudomonas as well as Enterococcus. With that being said he is  allergic to penicillin which took away the only oral medication for the Enterococcus I did look at linezolid as a possibility but unfortunately there were medication interactions. Also the same was true the Cipro where there were medication interactions. Nonetheless I want to see if we can get him into infectious disease to see if they can work with him to try to find possibly add at home IV antibiotic regimen that can help out at this point. No sharp debridement is can be necessary today. 09/07/2020 upon evaluation today patient appears to be doing unfortunately not too well in regard to his wound. He has been tolerating the dressing changes without complication. Fortunately there is no sign of active infection at this time. No fevers, chills, nausea, vomiting, or diarrhea. The patient did have a repeat surgery going to clean out the abscess and try to work on getting things moving in a better direction about a week and a half ago. He still has sutures in place. With that being said unfortunately he does not appear to be showing signs of a lot of improvement in my opinion. He does have sutures on the plantar aspect still in place and on the dorsal aspect he has a small opening which actually probes all the way down through and actually came out of the plantar aspect. I feel like that he really is having a lot of issues here and I am afraid that if we do not get this taken care of this is can end up with him having a much larger and more extensive amputation than what he was hoping to have to deal with with this. It has already been since June 2021 but has been dealing with this including all the time since the amputation. 09/14/2020 upon evaluation today patient appears to be doing really about the same in regard to the wound on the dorsal surface of his foot. The plantar foot unfortunately still has sutures in place I really cannot see how things are doing in that regard. With the dorsal foot however  this appears to show signs of still having a small opening in  the distal aspect which does actually probe down to bone there is also significant purulent drainage we did actually obtain a wound culture today to further evaluate the issue here. 09/21/2020 upon evaluation today patient appears to be doing about the same in regard to his wound on the dorsal surface of his foot and the plantar foot he still does not have the sutures out at this point. That should hopefully happen in the next week. With that being said the main issue is he continues to have drainage here. It was noted that he had positive findings on culture for Enterobacter. With that being said he cannot take oral penicillin he could do vancomycin and I think that is good to be the ideal thing this to see if we can get him started on IV vancomycin at dialysis. We will get a get in touch with them to try to see if we can coordinate that being done there at the dialysis center. I think that is the ideal situation at this point I would probably start with just 2 weeks of therapy. 09/28/2020 upon evaluation today patient appears to be doing well with regard to his wounds in general. He also has back on the vancomycin after the culture results and this seems to be doing excellent for him. All the wounds are showing signs of good improvement. No fevers, chills, nausea, vomiting, or diarrhea. 10/05/2020 upon evaluation today patient appears to actually be doing excellent in regard to his foot. Fortunately there is a lot of signs of new granulation epithelization at this point. There does not appear to be any evidence of active infection which is great news and overall very pleased with where things stand. In fact the open wound along the plantar aspect of his foot as well as the dorsal part of his foot has dramatically improved since we went forward with the vancomycin and requested that the dialysis center provide this to him by way of IV  antibiotics with his dialysis. Overall I think that has made a dramatic improvement and overall his wound seems to be doing significantly better at this point. No fevers, chills, nausea, vomiting, or diarrhea. The patient was set to have his second opinion appointment with podiatry [facility] next week but to be perfectly honest I am not even certain that that is necessary at this point I do not think there is much that they would be able to do for him nor that he really would want them to do considering how well things look at this time. 10/12/2020 on evaluation today patient appears to be doing excellent in regard to his wounds. In fact the only issue I see is that everything is dried out as far as the collagen is concerned. I do think we can try to keep this moist he will continue to make excellent progress as he has been. Overall I am extremely pleased with what I see today. 10/19/2020 upon evaluation today patient appears to be doing decently well in regard to the wounds on his foot he is can require some debridement in this location. Unfortunately is having some issues with blistering over the leg which is of greater concern at this point. Fortunately I do not see any signs of active infection which is great news. Nonetheless the blistering may just be related to venous insufficiency or subsequently could potentially be an issue with overall worsening with regard to an infection but again it is not really hot to touch. 10/26/2020 upon evaluation today  patient appears to be doing well with regard to the majority of his wounds. Unfortunately the dorsal surface of his foot does not appear to be doing quite as well today compared to what it has been doing. I do think that we will need to address that currently. Everything else is showing signs of improvement. He did not get the prescription that I gave him previous as unfortunately the transmission failed. I did not know that until today when the patient  mention to me that he never got the antibiotic. Ralph Peterson, Ralph ([id number]) Objective Constitutional Well-nourished and well-hydrated in no acute distress. Vitals Time Taken: 1:32 PM, Height: 72 in, Weight: 225 lbs, BMI: 30.5, Temperature: 98.7 F, Pulse: 75 bpm, Respiratory Rate: 18 breaths/min, Blood Pressure: 140/83 mmHg. Respiratory normal breathing without difficulty. Cardiovascular no clubbing, cyanosis, significant edema, Psychiatric this patient is able to make decisions and demonstrates good insight into disease process. Alert and Oriented x 3. pleasant and cooperative. General Notes: Upon inspection patient's wound bed actually showed signs of good granulation epithelization at this point over the majority of the wounds. Especially the lateral foot wounds both are doing excellent. His leg ulcerations are also doing quite well he has a deep tissue injury/blood blister on the heel which I think needs to be protected but otherwise is not actually open at this point which is good news. The worst issue currently is actually the dorsal surface of his foot distally which has reopened this is the surgical site. I did not perform debridement here to clear away some of the surface necrotic tissue where it was leaking through and then subsequently was able to clean away the cap that was hiding the opening. Subsequently there is a area of depth that is around 1.5 cm total by the end of all this. This is quite unfortunate we were hoping that that would be doing well at this point. Integumentary (Hair, Skin) Wound #1 status is Open. Original cause of wound was Pressure Injury. The date acquired was: 02/20/2020. The wound has been in treatment 26 weeks. The wound is located on the Right,Distal,Lateral Foot. The wound measures 0.5cm length x 1cm width x 0.2cm depth; 0.393cm^2 area and 0.079cm^3 volume. There is Fat Layer (Subcutaneous Tissue) exposed. There is no tunneling or undermining  noted. There is a small amount of serous drainage noted. The wound margin is distinct with the outline attached to the wound base. There is small (1-33%) pink granulation within the wound bed. There is a large (67-100%) amount of necrotic tissue within the wound bed including Adherent Slough. Wound #2R status is Open. Original cause of wound was Surgical Injury. The date acquired was: 04/20/2020. The wound has been in treatment 26 weeks. The wound is located on the Right,Distal,Dorsal Foot. The wound measures 0.5cm length x 0.3cm width x 0.5cm depth; 0.118cm^2 area and 0.059cm^3 volume. There is no tunneling or undermining noted. There is a medium amount of sanguinous drainage noted. The wound margin is distinct with the outline attached to the wound base. There is large (67-100%) red granulation within the wound bed. There is no necrotic tissue within the wound bed. Wound #7 status is Healed - Epithelialized. Original cause of wound was Blister. The date acquired was: 09/05/2020. The wound has been in treatment 7 weeks. The wound is located on the Right,Dorsal Foot. The wound measures 0cm length x 0cm width x 0cm depth; 0cm^2 area and 0cm^3 volume. There is no tunneling or undermining noted. There is a none  present amount of drainage noted. The wound margin is flat and intact. There is no granulation within the wound bed. There is no necrotic tissue within the wound bed. Wound #8 status is Open. Original cause of wound was Gradually Appeared. The date acquired was: 09/07/2020. The wound has been in treatment 4 weeks. The wound is located on the Right,Proximal,Lateral Foot. The wound measures 0.4cm length x 0.3cm width x 0.2cm depth; 0.094cm^2 area and 0.019cm^3 volume. There is Fat Layer (Subcutaneous Tissue) exposed. There is no tunneling or undermining noted. There is a small amount of serous drainage noted. The wound margin is flat and intact. There is medium (34-66%) pink granulation within the  wound bed. There is a medium (34-66%) amount of necrotic tissue within the wound bed including Eschar and Adherent Slough. Wound #9 status is Open. Original cause of wound was Blister. The date acquired was: 10/16/2020. The wound has been in treatment 1 weeks. The wound is located on the Right,Medial Lower Leg. The wound measures 9.7cm length x 6cm width x 0.1cm depth; 45.71cm^2 area and 4.571cm^3 volume. There is Fat Layer (Subcutaneous Tissue) exposed. There is no tunneling or undermining noted. There is a medium amount of serous drainage noted. There is large (67-100%) red, pink granulation within the wound bed. There is a small (1-33%) amount of necrotic tissue within the wound bed including Eschar and Adherent Slough. 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 ([id number]) Procedures Wound #1 Pre-procedure diagnosis of Wound #1 is a Pressure Ulcer located on the Right,Distal,Lateral Foot . There was a Excisional Skin/Subcutaneous Tissue Debridement with a total area of 0.5 sq cm performed by Tommie Sams., PA-C. With the following instrument(s): Curette to remove Viable and Non-Viable tissue/material. Material removed includes Callus, Subcutaneous Tissue, and Slough. A time out was conducted at 14:55, prior to the start of the procedure. A Minimum amount of bleeding was controlled with Pressure. The procedure was tolerated well. Post Debridement Measurements: 0.5cm length x 1cm width x 0.3cm depth; 0.118cm^3  volume. Post debridement Stage noted as Category/Stage IV. Character of Wound/Ulcer Post Debridement is stable. Post procedure Diagnosis Wound #1: Same as Pre-Procedure Wound #8 Pre-procedure diagnosis of Wound #8 is a Pressure Ulcer located on the Right,Proximal,Lateral Foot . There was a Excisional Skin/Subcutaneous Tissue Debridement with a total area of 0.12 sq cm performed by Tommie Sams., PA-C. With the following instrument(s): Curette to remove Viable and Non-Viable tissue/material. Material removed includes Callus, Subcutaneous Tissue, and Slough. A time out was conducted at 14:55, prior to the start of the procedure. A Minimum amount of bleeding was controlled with Pressure. The procedure was tolerated well. Post Debridement Measurements: 0.4cm length x 0.3cm width x 0.3cm depth; 0.028cm^3 volume. Post debridement Stage noted as Unstageable/Unclassified. Character of Wound/Ulcer Post Debridement is stable. Post procedure Diagnosis Wound #8: Same as Pre-Procedure Plan Follow-up Appointments: Return Appointment in 1 week. Bathing/ Shower/ Hygiene: Clean wound with Normal Saline or wound cleanser. Off-Loading: Open toe surgical shoe with peg assist. - Right foot Other: - Betadine on heel and gauze, wear socks for protection The following medication(s)  was prescribed: Bactrim DS oral 800 mg-160 mg tablet 1 1 tablet oral taken 2 times per day for 14 days starting 10/26/2020 WOUND #1: - Foot Wound Laterality: Right, Lateral, Distal Cleanser: Normal Saline (Generic) 3 x Per Week/30 Days Discharge Instructions: Wash your hands with soap and water. Remove old dressing, discard into plastic bag and place into trash. Cleanse the wound with Normal Saline prior to applying a clean dressing using gauze sponges, not tissues or cotton balls. Do not scrub or use excessive force. Pat dry using gauze sponges, not tissue or cotton balls. Primary Dressing: Prisma 4.34 (in) (Generic) 3 x Per Week/30  Days Discharge Instructions: Moisten with hydrogel Primary Dressing: Curad Oil Emulsion Dressing 3x3 (in/in) 3 x Per Week/30 Days Discharge Instructions: Apply on top of prisma Secondary Dressing: ABD Pad 5x9 (in/in) (Generic) 3 x Per Week/30 Days Discharge Instructions: Cover with ABD pad Secured With: 82M Medipore H Soft Cloth Surgical Tape, 2x2 (in/yd) (Generic) 3 x Per Week/30 Days Discharge Instructions: Secure gauze bandage Secured With: Kerlix Roll Sterile or Non-Sterile 6-ply 4.5x4 (yd/yd) 3 x Per Week/30 Days Discharge Instructions: Apply Kerlix as directed WOUND #2R: - Foot Wound Laterality: Dorsal, Right, Distal Cleanser: Normal Saline (Generic) 3 x Per Week/30 Days Discharge Instructions: Wash your hands with soap and water. Remove old dressing, discard into plastic bag and place into trash. Cleanse the wound with Normal Saline prior to applying a clean dressing using gauze sponges, not tissues or cotton balls. Do not scrub or use excessive force. Pat dry using gauze sponges, not tissue or cotton balls. Primary Dressing: Hydrofera Blue Classic Foam Rope Dressing, 9x6 (mm/in) 3 x Per Week/30 Days Discharge Instructions: Cut rope in half, 2 cm in length-pack in wound, moisten with a couple drops of saline after inserted (extras sent with patient) Secondary Dressing: ABD Pad 5x9 (in/in) (Generic) 3 x Per Week/30 Days Discharge Instructions: Cover with ABD pad Secured With: 82M Medipore H Soft Cloth Surgical Tape, 2x2 (in/yd) (Generic) 3 x Per Week/30 Days Discharge Instructions: Secure gauze bandage Secured With: Kerlix Roll Sterile or Non-Sterile 6-ply 4.5x4 (yd/yd) 3 x Per Week/30 Days Discharge Instructions: Apply Kerlix as directed WOUND #8: - Foot Wound Laterality: Right, Lateral, Proximal Cleanser: Normal Saline (Generic) 3 x Per Week/30 Days Discharge Instructions: Wash your hands with soap and water. Remove old dressing, discard into plastic bag and place into  trash. Cleanse the wound with Normal Saline prior to applying a clean dressing using gauze sponges, not tissues or cotton balls. Do not scrub or use excessive force. Pat dry using gauze sponges, not tissue or cotton balls. Primary Dressing: Prisma 4.34 (in) 3 x Per Week/30 Days Ralph Peterson, Ralph ([id number]) Discharge Instructions: Moisten with hydrogel Primary Dressing: Curad Oil Emulsion Dressing 3x3 (in/in) 3 x Per Week/30 Days Discharge Instructions: Apply on top of prisma Secondary Dressing: ABD Pad 5x9 (in/in) (Generic) 3 x Per Week/30 Days Discharge Instructions: Cover with ABD pad Secured With: 82M Medipore H Soft Cloth Surgical Tape, 2x2 (in/yd) (Generic) 3 x Per Week/30 Days Discharge Instructions: Secure gauze bandage Secured With: Kerlix Roll Sterile or Non-Sterile 6-ply 4.5x4 (yd/yd) (Generic) 3 x Per Week/30 Days Discharge Instructions: Apply Kerlix as directed WOUND #9: - Lower Leg Wound Laterality: Right, Medial Cleanser: Normal Saline 3 x Per Week/30 Days Discharge Instructions: Wash your hands with soap and water. Remove old dressing, discard into plastic bag and place into trash. Cleanse the wound with Normal Saline prior to applying a clean dressing using gauze  sponges, not tissues or cotton balls. Do not scrub or use excessive force. Pat dry using gauze sponges, not tissue or cotton balls. Primary Dressing: Curad Oil Emulsion Dressing 3x3 (in/in) 3 x Per Week/30 Days Discharge Instructions: Apply directly on wound bed Secondary Dressing: ABD Pad 5x9 (in/in) (Generic) 3 x Per Week/30 Days Discharge Instructions: Cover with ABD pad 1. Would recommend currently that we actually go ahead and initiate treatment with the wound care measures as before utilizing the silver collagen for the lateral foot wounds which seems to be doing quite well we will use an oil emulsion over top of this and the hydrogel to keep it moist. 2. With regard to the dorsal foot proximally this is  healed when she can use a protective dressing. 3. With regard to the leg wound is using the oil emulsion dressings. 4. With regard to the dorsal foot distally this unfortunately has reopened at this point he does get any need to be packed with Hydrofera Blue at this time. 5. I am going to send in the Bactrim for him yet again today since it did not go through last week he is in agreement with that plan. We will see patient back for reevaluation in 1 week here in the clinic. If anything worsens or changes patient will contact our office for additional recommendations. Electronic Signature(s) Signed: 10/26/2020 4:27:18 PM By: Worthy Keeler PA-C Entered By: Worthy Keeler on 10/26/2020 16:27:18 Ralph Peterson ([id number]) -------------------------------------------------------------------------------- SuperBill Details Patient Name: Ralph Peterson Date of Service: 10/26/2020 Medical Record Number: [id number] Patient Account Number: [id number] Date of Birth/Sex: [birth year] (55 y.o. M) Treating RN: Dolan Amen Primary Care Provider: Vidal Schwalbe Other Clinician: Jeanine Luz Referring Provider: Vidal Schwalbe Treating Provider/Extender: Skipper Cliche in Treatment: 26 Diagnosis Coding ICD-10 Codes Code Description [phone] Pressure ulcer of other site, stage 3 T81.31XA Disruption of external operation (surgical) wound, not elsewhere classified, initial encounter 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d L97.812 Non-pressure chronic ulcer of other part of right lower leg with fat layer exposed E11.621 Type 2 diabetes mellitus with foot ulcer I50.42 Chronic combined systolic (congestive) and diastolic (congestive) heart failure N18.6 End stage renal disease Z99.2 Dependence on renal dialysis Facility Procedures CPT4 Code: [id number] Description: 11042 - DEB SUBQ TISSUE 20 SQ CM/< Modifier: Quantity: 1 CPT4  Code: Description: ICD-10 Diagnosis Description L97.512 Non-pressure chronic ulcer of other part of right foot with fat layer ex Modifier: posed Quantity: Physician Procedures CPT4 [id number] Description: 99214 - WC PHYS LEVEL 4 - EST PT Modifier: 25 Quantity: 1 CPT4 Code: Description: ICD-10 Diagnosis Description L89.893 Pressure ulcer of other site, stage 3 T81.31XA Disruption of external operation (surgical) wound, not elsewhere classifie 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  L97.812 Non-pressure chronic ulcer of other part of right lower leg with fat layer Modifier: d, initial encounter sed exposed Quantity: CPT4 Code: [id number] Description: [id number] - WC PHYS SUBQ TISS 20 SQ CM Modifier: Quantity: 1 CPT4 Code: Description: ICD-10 Diagnosis Description 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 Modifier: sed Quantity: Electronic Signature(s) Signed: 10/26/2020 4:27:50 PM By: Worthy Keeler PA-C Entered By: Worthy Keeler on 10/26/2020 16:27:49

## 2020-10-27 NOTE — Progress Notes (Signed)
Ralph Peterson, Ralph Peterson ([id number]) Visit Report for 10/19/2020 Arrival Information Details Patient Name: Ralph Peterson, Ralph Peterson. Date of Service: 10/19/2020 8:00 AM Medical Record Number: [id number] Patient Account Number: [id number] Date of Birth/Sex: 11-12-1965 (55 y.o. M) Treating RN: Dolan Amen Primary Care Mandell Pangborn: Vidal Schwalbe Other Clinician: Jeanine Luz Referring Zetha Kuhar: Vidal Schwalbe Treating Trayden Brandy/Extender: Skipper Cliche in Treatment: 25 Visit Information History Since Last Visit Added or deleted any medications: No Patient Arrived: Wheel Chair Had a fall or experienced change in No Arrival Time: 08:13 activities of daily living that may affect Accompanied By: son risk of falls: Transfer Assistance: None Hospitalized since last visit: No Patient Identification Verified: Yes Pain Present Now: Yes Secondary Verification Process Completed: Yes Patient Requires Transmission-Based Precautions: No Patient Has Alerts: No Electronic Signature(s) Signed: 10/19/2020 4:38:23 PM By: Jeanine Luz Entered By: Jeanine Luz on 10/19/2020 08:14:37 Ralph Peterson ([id number]) -------------------------------------------------------------------------------- Clinic Level of Care Assessment Details Patient Name: Ralph Peterson Date of Service: 10/19/2020 8:00 AM Medical Record Number: [id number] Patient Account Number: [id number] Date of Birth/Sex: 08-29-1965 (55 y.o. M) Treating RN: Dolan Amen Primary Care Khallid Pasillas: Vidal Schwalbe Other Clinician: Jeanine Luz Referring Kailee Essman: Vidal Schwalbe Treating Yeni Jiggetts/Extender: Skipper Cliche in Treatment: 25 Clinic Level of Care Assessment Items TOOL 1 Quantity Score '[]'$  - Use when EandM and Procedure is performed on INITIAL visit 0 ASSESSMENTS - Nursing Assessment / Reassessment '[]'$  - General Physical Exam (combine w/ comprehensive assessment (listed just below) when performed on new 0 pt.  evals) '[]'$  - 0 Comprehensive Assessment (HX, ROS, Risk Assessments, Wounds Hx, etc.) ASSESSMENTS - Wound and Skin Assessment / Reassessment '[]'$  - Dermatologic / Skin Assessment (not related to wound area) 0 ASSESSMENTS - Ostomy and/or Continence Assessment and Care '[]'$  - Incontinence Assessment and Management 0 '[]'$  - 0 Ostomy Care Assessment and Management (repouching, etc.) PROCESS - Coordination of Care '[]'$  - Simple Patient / Family Education for ongoing care 0 '[]'$  - 0 Complex (extensive) Patient / Family Education for ongoing care '[]'$  - 0 Staff obtains Programmer, systems, Records, Test Results / Process Orders '[]'$  - 0 Staff telephones HHA, Nursing Homes / Clarify orders / etc '[]'$  - 0 Routine Transfer to another Facility (non-emergent condition) '[]'$  - 0 Routine Hospital Admission (non-emergent condition) '[]'$  - 0 New Admissions / Biomedical engineer / Ordering NPWT, Apligraf, etc. '[]'$  - 0 Emergency Hospital Admission (emergent condition) PROCESS - Special Needs '[]'$  - Pediatric / Minor Patient Management 0 '[]'$  - 0 Isolation Patient Management '[]'$  - 0 Hearing / Language / Visual special needs '[]'$  - 0 Assessment of Community assistance (transportation, D/C planning, etc.) '[]'$  - 0 Additional assistance / Altered mentation '[]'$  - 0 Support Surface(s) Assessment (bed, cushion, seat, etc.) INTERVENTIONS - Miscellaneous '[]'$  - External ear exam 0 '[]'$  - 0 Patient Transfer (multiple staff / Civil Service fast streamer / Similar devices) '[]'$  - 0 Simple Staple / Suture removal (25 or less) '[]'$  - 0 Complex Staple / Suture removal (26 or more) '[]'$  - 0 Hypo/Hyperglycemic Management (do not check if billed separately) '[]'$  - 0 Ankle / Brachial Index (ABI) - do not check if billed separately Has the patient been seen at the hospital within the last three years: Yes Total Score: 0 Level Of Care: ____ Ralph Peterson ([id number]) Electronic Signature(s) Signed: 10/19/2020 5:09:32 PM By: Georges Mouse, Minus Breeding RN Entered  By: Georges Mouse, Minus Breeding on 10/19/2020 09:18:18 Ralph Peterson ([id number]) -------------------------------------------------------------------------------- Encounter Discharge Information Details Patient Name: Ralph Peterson. Date of Service: 10/19/2020 8:00 AM  Medical Record Number: [id number] Patient Account Number: [id number] Date of Birth/Sex: 11-24-1965 (55 y.o. M) Treating RN: Carlene Coria Primary Care Farron Lafond: Vidal Schwalbe Other Clinician: Jeanine Luz Referring Neveen Daponte: Vidal Schwalbe Treating Brittiney Dicostanzo/Extender: Skipper Cliche in Treatment: 25 Encounter Discharge Information Items Post Procedure Vitals Discharge Condition: Stable Temperature (F): 98.3 Ambulatory Status: Wheelchair Pulse (bpm): 74 Discharge Destination: Home Respiratory Rate (breaths/min): 18 Transportation: Private Auto Blood Pressure (mmHg): 131/79 Accompanied By: self Schedule Follow-up Appointment: Yes Clinical Summary of Care: Patient Declined Electronic Signature(s) Signed: 10/27/2020 8:11:40 AM By: Carlene Coria RN Entered By: Carlene Coria on 10/19/2020 09:38:55 Ralph Peterson ([id number]) -------------------------------------------------------------------------------- Lower Extremity Assessment Details Patient Name: Ralph Peterson. Date of Service: 10/19/2020 8:00 AM Medical Record Number: [id number] Patient Account Number: [id number] Date of Birth/Sex: 1965/09/13 (55 y.o. M) Treating RN: Dolan Amen Primary Care Khayden Herzberg: Vidal Schwalbe Other Clinician: Jeanine Luz Referring Phuoc Huy: Vidal Schwalbe Treating Karolina Zamor/Extender: Skipper Cliche in Treatment: 25 Edema Assessment Assessed: [Left: No] [Right: Yes] Edema: [Left: Ye] [Right: s] Calf Left: Right: Point of Measurement: 38 cm From Medial Instep 33.1 cm Ankle Left: Right: Point of Measurement: 10 cm From Medial Instep 22.1 cm Vascular Assessment Pulses: Dorsalis Pedis Palpable:  [Right:Yes] Electronic Signature(s) Signed: 10/19/2020 4:38:23 PM By: Jeanine Luz Signed: 10/19/2020 5:09:32 PM By: Georges Mouse, Minus Breeding RN Entered By: Jeanine Luz on 10/19/2020 08:38:41 Ralph Peterson ([id number]) -------------------------------------------------------------------------------- Multi Wound Chart Details Patient Name: Ralph Peterson. Date of Service: 10/19/2020 8:00 AM Medical Record Number: [id number] Patient Account Number: [id number] Date of Birth/Sex: May 28, 1966 (55 y.o. 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 Comorbid History: Arrhythmia, Congestive Heart Arrhythmia, Congestive Heart Arrhythmia, Congestive Heart Failure, Coronary Artery Disease, Failure, Coronary Artery Disease, Failure, Coronary Artery Disease, Hypertension, Myocardial Infarction, Hypertension, Myocardial Infarction, Hypertension, Myocardial Infarction, Peripheral Venous Disease, Type II Peripheral Venous Disease, Type II Peripheral Venous Disease, Type II Diabetes, Neuropathy Diabetes, Neuropathy Diabetes, Neuropathy Date Acquired: 02/20/2020 09/05/2020 09/07/2020 Weeks of Treatment: '25 6 3 '$ Wound Status: Open Open Open Measurements L x W x D (cm) 0.5x1x0.2 0.6x0.5x0.1 0.5x0.5x0.2 Area (cm) : 0.393 0.236 0.196 Volume (cm) : 0.079 0.024 0.039 % Reduction in Area: 92.20% 98.60% -55.60% % Reduction in  Volume: 96.90% 98.60% -200.00% Classification: Category/Stage IV Grade 2 Unstageable/Unclassified Exudate Amount: Small None Present Small Exudate Type: Serous N/A Serous Exudate Color: amber N/A amber Wound Margin: Distinct, outline attached Flat and Intact Flat and Intact Granulation Amount: None Present (0%) None Present (0%) None Present (0%) Granulation Quality: N/A N/A N/A Necrotic Amount: Large (67-100%) Large (67-100%) Large (67-100%) Necrotic Tissue: Eschar Eschar Eschar Exposed Structures: Fat Layer (Subcutaneous Tissue): Fat Layer (Subcutaneous Tissue): Fat Layer (Subcutaneous Tissue): Yes Yes Yes Fascia: No Fascia: No Fascia: No Tendon: No Tendon: No Tendon: No Muscle: No Muscle: No Muscle: No Joint: No Joint: No Joint: No Bone: No Bone: No Bone: No Epithelialization: None Small (1-33%) N/A Debridement: Debridement - Excisional Debridement - Selective/Open Debridement - Excisional Wound Pre-procedure Verification/Time 08:58 08:55 09:02 Out Taken: Pain Control: Lidocaine 4% Topical Solution Lidocaine 4% Topical Solution Lidocaine 4% Topical Solution Tissue  Debrided: Necrotic/Eschar, Subcutaneous Necrotic/Eschar Necrotic/Eschar, Subcutaneous Level: Skin/Subcutaneous Tissue Non-Viable Tissue Skin/Subcutaneous Tissue Debridement Area (sq cm): 0.5 0.3 0.25 Instrument: Curette Curette Curette Ralph Peterson, NUNZIATA. ([id number]) Bleeding: None None Minimum Hemostasis Achieved: N/A N/A Pressure Debridement Treatment Procedure was tolerated well Procedure was tolerated well Procedure was tolerated well Response: Post Debridement 0.5x1x0.2 0.6x0.5x0.1 0.5x0.5x0.2 Measurements L x W x D (cm) Post Debridement Volume: 0.079 0.024 0.039 (cm) Post Debridement Stage: Category/Stage IV N/A Unstageable/Unclassified Procedures Performed: Debridement Debridement Debridement Wound Number: 9 N/A N/A Photos: N/A N/A Wound Location: Right, Medial Lower Leg N/A N/A Wounding  Event: Blister N/A N/A Primary Etiology: Diabetic Wound/Ulcer of the Lower N/A N/A Extremity Comorbid History: Arrhythmia, Congestive Heart N/A N/A Failure, Coronary Artery Disease, Hypertension, Myocardial Infarction, Peripheral Venous Disease, Type II Diabetes, Neuropathy Date Acquired: 10/16/2020 N/A N/A Weeks of Treatment: 0 N/A N/A Wound Status: Open N/A N/A Measurements L x W x D (cm) 9.9x5.6x0.1 N/A N/A Area (cm) : 43.542 N/A N/A Volume (cm) : 4.354 N/A N/A % Reduction in Area: 0.00% N/A N/A % Reduction in Volume: 0.00% N/A N/A Classification: Grade 1 N/A N/A Exudate Amount: Medium N/A N/A Exudate Type: Sanguinous N/A N/A Exudate Color: red N/A N/A Wound Margin: N/A N/A N/A Granulation Amount: Large (67-100%) N/A N/A Granulation Quality: Red, Pink N/A N/A Necrotic Amount: None Present (0%) N/A N/A Necrotic Tissue: N/A N/A N/A Exposed Structures: Fat Layer (Subcutaneous Tissue): N/A N/A Yes Fascia: No Tendon: No Muscle: No Joint: No Bone: No Epithelialization: N/A N/A N/A Debridement: N/A N/A N/A Pain Control: N/A N/A N/A Tissue Debrided: N/A N/A N/A Level: N/A N/A N/A Debridement Area (sq cm): N/A N/A N/A Instrument: N/A N/A N/A Bleeding: N/A N/A N/A Hemostasis Achieved: N/A N/A N/A Debridement Treatment N/A N/A N/A Response: Post Debridement N/A N/A N/A Measurements L x W x D (cm) Post Debridement Volume: N/A N/A N/A (cm) Post Debridement Stage: N/A N/A N/A Procedures Performed: N/A N/A N/A Ralph Peterson, Ralph Peterson ([id number]) Treatment Notes Electronic Signature(s) Signed: 10/19/2020 5:09:32 PM By: Georges Mouse, Minus Breeding RN Entered By: Georges Mouse, Minus Breeding on 10/19/2020 09:09:04 Ralph Peterson ([id number]) -------------------------------------------------------------------------------- Multi-Disciplinary Care Plan Details Patient Name: Ralph Peterson Date of Service: 10/19/2020 8:00 AM Medical Record Number: [id number] Patient  Account Number: [id number] Date of Birth/Sex: [birth year] (55 y.o. M) Treating RN: Dolan Amen Primary Care Ramesses Crampton: Vidal Schwalbe Other Clinician: Jeanine Luz Referring Aeriana Speece: Vidal Schwalbe Treating Rayven Rettig/Extender: Skipper Cliche in Treatment: 25 Active Inactive Necrotic Tissue Nursing Diagnoses: Impaired tissue integrity related to necrotic/devitalized tissue Knowledge deficit related to management of necrotic/devitalized tissue Goals: Necrotic/devitalized tissue will be minimized in the wound bed Date Initiated: 05/09/2020 Target Resolution Date: 05/09/2020 Goal Status: Active Interventions: Assess patient pain level pre-, during and post procedure and prior to discharge Provide education on necrotic tissue and debridement process Treatment Activities: Enzymatic debridement : 05/09/2020 Notes: Electronic Signature(s) Signed: 10/19/2020 5:09:32 PM By: Georges Mouse, Minus Breeding RN Entered By: Georges Mouse, Minus Breeding on 10/19/2020 09:08:57 Ralph Peterson ([id number]) -------------------------------------------------------------------------------- Pain Assessment Details Patient Name: Ralph Peterson. Date of Service: 10/19/2020 8:00 AM Medical Record Number: [id number] Patient Account Number: [id number] Date of Birth/Sex: May 13, 1966 (55 y.o. M) Treating RN: Dolan Amen Primary Care Alem Fahl: Vidal Schwalbe Other Clinician: Jeanine Luz Referring Omaira Mellen: Vidal Schwalbe Treating Saleemah Mollenhauer/Extender: Skipper Cliche in Treatment: 25 Active Problems Location of Pain Severity and Description of Pain Patient Has Paino Yes Site Locations Rate the pain. Current Pain Level: 3 Pain Management and Medication Current Pain Management: Electronic Signature(s)  Signed: 10/19/2020 4:38:23 PM By: Jeanine Luz Signed: 10/19/2020 5:09:32 PM By: Georges Mouse, Minus Breeding RN Entered By: Jeanine Luz on 10/19/2020 08:18:05 Ralph Peterson  ([id number]) -------------------------------------------------------------------------------- Patient/Caregiver Education Details Patient Name: Ralph Peterson Date of Service: 10/19/2020 8:00 AM Medical Record Number: [id number] Patient Account Number: [id number] Date of Birth/Gender: 07-02-1966 (55 y.o. M) Treating RN: Dolan Amen Primary Care Physician: Vidal Schwalbe Other Clinician: Jeanine Luz Referring Physician: Vidal Schwalbe Treating Physician/Extender: Skipper Cliche in Treatment: 25 Education Assessment Education Provided To: Patient Education Topics Provided Wound/Skin Impairment: Methods: Explain/Verbal Responses: State content correctly Electronic Signature(s) Signed: 10/19/2020 5:09:32 PM By: Georges Mouse, Minus Breeding RN Entered By: Georges Mouse, Minus Breeding on 10/19/2020 09:18:50 Ralph Peterson ([id number]) -------------------------------------------------------------------------------- Wound Assessment Details Patient Name: Ralph Peterson Date of Service: 10/19/2020 8:00 AM Medical Record Number: [id number] Patient Account Number: [id number] Date of Birth/Sex: 11-18-1965 (55 y.o. M) Treating RN: Dolan Amen Primary Care Jae Skeet: Vidal Schwalbe Other Clinician: Jeanine Luz Referring Barton Want: Vidal Schwalbe Treating Shaketa Serafin/Extender: Skipper Cliche in Treatment: 25 Wound Status Wound Number: 1 Primary Pressure Ulcer Etiology: Wound Location: Right, Distal, Lateral Foot Wound Open Wounding Event: Pressure Injury Status: Date Acquired: 02/20/2020 Comorbid Arrhythmia, Congestive Heart Failure, Coronary Artery Weeks Of Treatment: 25 History: Disease, Hypertension, Myocardial Infarction, Peripheral Clustered Wound: No Venous Disease, Type II Diabetes, Neuropathy Photos Wound Measurements Length: (cm) 0.5 Width: (cm) 1 Depth: (cm) 0.2 Area: (cm) 0.393 Volume: (cm) 0.079 % Reduction in Area: 92.2% % Reduction in Volume:  96.9% Epithelialization: None Tunneling: No Undermining: No Wound Description Classification: Category/Stage IV Wound Margin: Distinct, outline attached Exudate Amount: Small Exudate Type: Serous Exudate Color: amber Foul Odor After Cleansing: No Slough/Fibrino No Wound Bed Granulation Amount: None Present (0%) Exposed Structure Necrotic Amount: Large (67-100%) Fascia Exposed: No Necrotic Quality: Eschar Fat Layer (Subcutaneous Tissue) Exposed: Yes Tendon Exposed: No Muscle Exposed: No Joint Exposed: No Bone Exposed: No Electronic Signature(s) Signed: 10/19/2020 4:38:23 PM By: Jeanine Luz Signed: 10/19/2020 5:09:32 PM By: Georges Mouse, Minus Breeding RN Entered By: Jeanine Luz on 10/19/2020 08:34:22 Ralph Peterson ([id number]) -------------------------------------------------------------------------------- Wound Assessment Details Patient Name: Ralph Peterson. Date of Service: 10/19/2020 8:00 AM Medical Record Number: [id number] Patient Account Number: [id number] Date of Birth/Sex: Feb 11, 1966 (55 y.o. M) Treating RN: Dolan Amen Primary Care Lis Savitt: Vidal Schwalbe Other Clinician: Jeanine Luz Referring Brinn Westby: Vidal Schwalbe Treating Kaislyn Gulas/Extender: Skipper Cliche in Treatment: 25 Wound Status Wound Number: 7 Primary Diabetic Wound/Ulcer of the Lower Extremity Etiology: Wound Location: Right, Dorsal Foot Wound Open Wounding Event: Blister Status: Date Acquired: 09/05/2020 Comorbid Arrhythmia, Congestive Heart Failure, Coronary Artery Weeks Of Treatment: 6 History: Disease, Hypertension, Myocardial Infarction, Peripheral Clustered Wound: No Venous Disease, Type II Diabetes, Neuropathy Photos Wound Measurements Length: (cm) 0.6 Width: (cm) 0.5 Depth: (cm) 0.1 Area: (cm) 0.236 Volume: (cm) 0.024 % Reduction in Area: 98.6% % Reduction in Volume: 98.6% Epithelialization: Small (1-33%) Tunneling: No Undermining: No Wound  Description Classification: Grade 2 Wound Margin: Flat and Intact Exudate Amount: None Present Foul Odor After Cleansing: No Slough/Fibrino No Wound Bed Granulation Amount: None Present (0%) Exposed Structure Necrotic Amount: Large (67-100%) Fascia Exposed: No Necrotic Quality: Eschar Fat Layer (Subcutaneous Tissue) Exposed: Yes Tendon Exposed: No Muscle Exposed: No Joint Exposed: No Bone Exposed: No Electronic Signature(s) Signed: 10/19/2020 4:38:23 PM By: Jeanine Luz Signed: 10/19/2020 5:09:32 PM By: Georges Mouse, Minus Breeding RN Entered By: Jeanine Luz on 10/19/2020 08:34:57 Ralph Peterson ([id number]) -------------------------------------------------------------------------------- Wound Assessment Details Patient Name: Ralph Peterson. Date of Service: 10/19/2020  8:00 AM Medical Record Number: [id number] Patient Account Number: [id number] Date of Birth/Sex: 1965-10-02 (55 y.o. M) Treating RN: Dolan Amen Primary Care Nury Nebergall: Vidal Schwalbe Other Clinician: Jeanine Luz Referring Ila Landowski: Vidal Schwalbe Treating Tyrae Alcoser/Extender: Skipper Cliche in Treatment: 25 Wound Status Wound Number: 8 Primary Pressure Ulcer Etiology: Wound Location: Right, Proximal, Lateral Foot Wound Open Wounding Event: Gradually Appeared Status: Date Acquired: 09/07/2020 Comorbid Arrhythmia, Congestive Heart Failure, Coronary Artery Weeks Of Treatment: 3 History: Disease, Hypertension, Myocardial Infarction, Peripheral Clustered Wound: No Venous Disease, Type II Diabetes, Neuropathy Photos Wound Measurements Length: (cm) 0.5 Width: (cm) 0.5 Depth: (cm) 0.2 Area: (cm) 0.196 Volume: (cm) 0.039 % Reduction in Area: -55.6% % Reduction in Volume: -200% Tunneling: No Undermining: No Wound Description Classification: Unstageable/Unclassified Wound Margin: Flat and Intact Exudate Amount: Small Exudate Type: Serous Exudate Color: amber Foul Odor After  Cleansing: No Slough/Fibrino No Wound Bed Granulation Amount: None Present (0%) Exposed Structure Necrotic Amount: Large (67-100%) Fascia Exposed: No Necrotic Quality: Eschar Fat Layer (Subcutaneous Tissue) Exposed: Yes Tendon Exposed: No Muscle Exposed: No Joint Exposed: No Bone Exposed: No Electronic Signature(s) Signed: 10/19/2020 4:38:23 PM By: Jeanine Luz Signed: 10/19/2020 5:09:32 PM By: Georges Mouse, Minus Breeding RN Entered By: Jeanine Luz on 10/19/2020 08:35:32 Ralph Peterson ([id number]) -------------------------------------------------------------------------------- Wound Assessment Details Patient Name: Ralph Peterson. Date of Service: 10/19/2020 8:00 AM Medical Record Number: [id number] Patient Account Number: [id number] Date of Birth/Sex: 01-16-1966 (55 y.o. M) Treating RN: Dolan Amen Primary Care Terryann Verbeek: Vidal Schwalbe Other Clinician: Jeanine Luz Referring Preslynn Bier: Vidal Schwalbe Treating Jamyrah Saur/Extender: Skipper Cliche in Treatment: 25 Wound Status Wound Number: 9 Primary Diabetic Wound/Ulcer of the Lower Extremity Etiology: Wound Location: Right, Medial Lower Leg Wound Open Wounding Event: Blister Status: Date Acquired: 10/16/2020 Comorbid Arrhythmia, Congestive Heart Failure, Coronary Artery Weeks Of Treatment: 0 History: Disease, Hypertension, Myocardial Infarction, Peripheral Clustered Wound: No Venous Disease, Type II Diabetes, Neuropathy Photos Wound Measurements Length: (cm) 9.9 Width: (cm) 5.6 Depth: (cm) 0.1 Area: (cm) 43.542 Volume: (cm) 4.354 % Reduction in Area: 0% % Reduction in Volume: 0% Tunneling: No Undermining: No Wound Description Classification: Grade 1 Exudate Amount: Medium Exudate Type: Sanguinous Exudate Color: red Foul Odor After Cleansing: No Slough/Fibrino No Wound Bed Granulation Amount: Large (67-100%) Exposed Structure Granulation Quality: Red, Pink Fascia Exposed: No Necrotic  Amount: None Present (0%) Fat Layer (Subcutaneous Tissue) Exposed: Yes Tendon Exposed: No Muscle Exposed: No Joint Exposed: No Bone Exposed: No Electronic Signature(s) Signed: 10/19/2020 4:38:23 PM By: Jeanine Luz Signed: 10/19/2020 5:09:32 PM By: Georges Mouse, Minus Breeding RN Entered By: Jeanine Luz on 10/19/2020 08:36:28 Ralph Peterson ([id number]) -------------------------------------------------------------------------------- Vitals Details Patient Name: Ralph Peterson. Date of Service: 10/19/2020 8:00 AM Medical Record Number: [id number] Patient Account Number: [id number] Date of Birth/Sex: 1965/08/09 (55 y.o. M) Treating RN: Dolan Amen Primary Care Arieonna Medine: Vidal Schwalbe Other Clinician: Jeanine Luz Referring Kashawn Manzano: Vidal Schwalbe Treating Baani Bober/Extender: Skipper Cliche in Treatment: 25 Vital Signs Time Taken: 08:15 Temperature (F): 98.3 Height (in): 72 Pulse (bpm): 74 Weight (lbs): 225 Respiratory Rate (breaths/min): 18 Body Mass Index (BMI): 30.5 Blood Pressure (mmHg): 131/79 Reference Range: 80 - 120 mg / dl Electronic Signature(s) Signed: 10/19/2020 4:38:23 PM By: Jeanine Luz Entered By: Jeanine Luz on 10/19/2020 08:17:05

## 2020-10-27 NOTE — Progress Notes (Signed)
TAHJEE, MONDAY ([id number]) Visit Report for 10/26/2020 Arrival Information Details Patient Name: Ralph Peterson, Ralph Peterson. Date of Service: 10/26/2020 1:30 PM Medical Record Number: [id number] Patient Account Number: [id number] Date of Birth/Sex: [birth year] (55 y.o. M) Treating RN: Dolan Amen Primary Care Chriss Mannan: Vidal Schwalbe Other Clinician: Jeanine Luz Referring Lillia Lengel: Vidal Schwalbe Treating Salem Mastrogiovanni/Extender: Skipper Cliche in Treatment: 26 Visit Information History Since Last Visit Added or deleted any medications: No Patient Arrived: Wheel Chair Had a fall or experienced change in No Arrival Time: 13:33 activities of daily living that may affect Accompanied By: son risk of falls: Transfer Assistance: None Hospitalized since last visit: No Patient Identification Verified: Yes Pain Present Now: No Secondary Verification Process Completed: Yes Patient Requires Transmission-Based Precautions: No Patient Has Alerts: No Electronic Signature(s) Signed: 10/27/2020 8:09:31 AM By: Jeanine Luz Entered By: Jeanine Luz on 10/26/2020 13:34:43 Ralph Peterson ([id number]) -------------------------------------------------------------------------------- Clinic Level of Care Assessment Details Patient Name: Ralph Peterson Date of Service: 10/26/2020 1:30 PM Medical Record Number: [id number] Patient Account Number: [id number] Date of Birth/Sex: [birth year] (55 y.o. M) Treating RN: Dolan Amen Primary Care Sem Mccaughey: Vidal Schwalbe Other Clinician: Jeanine Luz Referring Jarelis Ehlert: Vidal Schwalbe Treating Debera Sterba/Extender: Skipper Cliche in Treatment: 26 Clinic Level of Care Assessment Items TOOL 1 Quantity Score '[]'$  - Use when EandM and Procedure is performed on INITIAL visit 0 ASSESSMENTS - Nursing Assessment / Reassessment '[]'$  - General Physical Exam (combine w/ comprehensive assessment (listed just below) when performed on new 0 pt.  evals) '[]'$  - 0 Comprehensive Assessment (HX, ROS, Risk Assessments, Wounds Hx, etc.) ASSESSMENTS - Wound and Skin Assessment / Reassessment '[]'$  - Dermatologic / Skin Assessment (not related to wound area) 0 ASSESSMENTS - Ostomy and/or Continence Assessment and Care '[]'$  - Incontinence Assessment and Management 0 '[]'$  - 0 Ostomy Care Assessment and Management (repouching, etc.) PROCESS - Coordination of Care '[]'$  - Simple Patient / Family Education for ongoing care 0 '[]'$  - 0 Complex (extensive) Patient / Family Education for ongoing care '[]'$  - 0 Staff obtains Programmer, systems, Records, Test Results / Process Orders '[]'$  - 0 Staff telephones HHA, Nursing Homes / Clarify orders / etc '[]'$  - 0 Routine Transfer to another Facility (non-emergent condition) '[]'$  - 0 Routine Hospital Admission (non-emergent condition) '[]'$  - 0 New Admissions / Biomedical engineer / Ordering NPWT, Apligraf, etc. '[]'$  - 0 Emergency Hospital Admission (emergent condition) PROCESS - Special Needs '[]'$  - Pediatric / Minor Patient Management 0 '[]'$  - 0 Isolation Patient Management '[]'$  - 0 Hearing / Language / Visual special needs '[]'$  - 0 Assessment of Community assistance (transportation, D/C planning, etc.) '[]'$  - 0 Additional assistance / Altered mentation '[]'$  - 0 Support Surface(s) Assessment (bed, cushion, seat, etc.) INTERVENTIONS - Miscellaneous '[]'$  - External ear exam 0 '[]'$  - 0 Patient Transfer (multiple staff / Civil Service fast streamer / Similar devices) '[]'$  - 0 Simple Staple / Suture removal (25 or less) '[]'$  - 0 Complex Staple / Suture removal (26 or more) '[]'$  - 0 Hypo/Hyperglycemic Management (do not check if billed separately) '[]'$  - 0 Ankle / Brachial Index (ABI) - do not check if billed separately Has the patient been seen at the hospital within the last three years: Yes Total Score: 0 Level Of Care: ____ Ralph Peterson ([id number]) Electronic Signature(s) Signed: 10/26/2020 5:35:33 PM By: Georges Mouse, Minus Breeding RN Entered By:  Georges Mouse, Minus Breeding on 10/26/2020 15:09:11 Ralph Peterson ([id number]) -------------------------------------------------------------------------------- Encounter Discharge Information Details Patient Name: Ralph Peterson Date of Service: 10/26/2020 1:30 PM  Medical Record Number: [id number] Patient Account Number: [id number] Date of Birth/Sex: [birth year] (55 y.o. M) Treating RN: Donnamarie Poag Primary Care Kyndahl Jablon: Vidal Schwalbe Other Clinician: Jeanine Luz Referring Julion Gatt: Vidal Schwalbe Treating Peng Thorstenson/Extender: Skipper Cliche in Treatment: 26 Encounter Discharge Information Items Post Procedure Vitals Discharge Condition: Stable Temperature (F): 98.7 Ambulatory Status: Wheelchair Pulse (bpm): 75 Discharge Destination: Home Respiratory Rate (breaths/min): 18 Transportation: Private Auto Blood Pressure (mmHg): 140/83 Accompanied By: son Schedule Follow-up Appointment: Yes Clinical Summary of Care: Electronic Signature(s) Signed: 10/26/2020 5:09:19 PM By: Donnamarie Poag Entered By: Donnamarie Poag on 10/26/2020 15:28:00 Ralph Peterson ([id number]) -------------------------------------------------------------------------------- Lower Extremity Assessment Details Patient Name: Ralph Peterson. Date of Service: 10/26/2020 1:30 PM Medical Record Number: [id number] Patient Account Number: [id number] Date of Birth/Sex: 1965-07-31 (55 y.o. M) Treating RN: Dolan Amen Primary Care Adren Dollins: Vidal Schwalbe Other Clinician: Jeanine Luz Referring Rayhana Slider: Vidal Schwalbe Treating Jeanise Durfey/Extender: Skipper Cliche in Treatment: 26 Edema Assessment Assessed: [Left: No] [Right: Yes] Edema: [Left: Ye] [Right: s] Calf Left: Right: Point of Measurement: 38 cm From Medial Instep 32.3 cm Ankle Left: Right: Point of Measurement: 10 cm From Medial Instep 21 cm Vascular Assessment Pulses: Dorsalis Pedis Palpable: [Right:Yes] Electronic  Signature(s) Signed: 10/26/2020 5:35:33 PM By: Georges Mouse, Minus Breeding RN Signed: 10/27/2020 8:09:31 AM By: Jeanine Luz Entered By: Jeanine Luz on 10/26/2020 13:57:17 Ralph Peterson ([id number]) -------------------------------------------------------------------------------- Multi Wound Chart Details Patient Name: Ralph Peterson. Date of Service: 10/26/2020 1:30 PM Medical Record Number: [id number] Patient Account Number: [id number] Date of Birth/Sex: 07-17-1966 (55 y.o. 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 Comorbid History: Arrhythmia, Congestive Heart Arrhythmia, Congestive Heart Arrhythmia, Congestive Heart Failure, Coronary Artery Disease, Failure, Coronary Artery Disease, Failure, Coronary Artery Disease, Hypertension, Myocardial Infarction, Hypertension, Myocardial Infarction, Hypertension, Myocardial Infarction, Peripheral Venous Disease, Type II Peripheral Venous Disease, Type II Peripheral Venous Disease, Type II Diabetes, Neuropathy Diabetes, Neuropathy Diabetes, Neuropathy Date Acquired: 02/20/2020 09/05/2020 09/07/2020 Weeks of Treatment: '26 7 4 '$ Wound Status: Open Healed - Epithelialized Open Measurements L x W x D (cm) 0.5x1x0.2 0x0x0 0.4x0.3x0.2 Area (cm) : 0.393 0 0.094 Volume (cm) : 0.079 0 0.019 % Reduction in Area: 92.20% 100.00% 25.40% % Reduction in Volume: 96.90%  100.00% -46.20% Classification: Category/Stage IV Grade 2 Unstageable/Unclassified Exudate Amount: Small None Present Small Exudate Type: Serous N/A Serous Exudate Color: amber N/A amber Wound Margin: Distinct, outline attached Flat and Intact Flat and Intact Granulation Amount: Small (1-33%) None Present (0%) Medium (34-66%) Granulation Quality: Pink N/A Pink Necrotic Amount: Large (67-100%) None Present (0%) Medium (34-66%) Necrotic Tissue: Adherent Independence Exposed Structures: Fat Layer (Subcutaneous Tissue): Fascia: No Fat Layer (Subcutaneous Tissue): Yes Fat Layer (Subcutaneous Tissue): Yes Fascia: No No Fascia: No Tendon: No Tendon: No Tendon: No Muscle: No Muscle: No Muscle: No Joint: No Joint: No Joint: No Bone: No Bone: No Bone: No Epithelialization: None Large (67-100%) N/A Wound Number: 9 N/A N/A Photos: N/A N/A Ralph Peterson ([id number]) Wound Location: Right, Medial Lower Leg N/A N/A Wounding Event: Blister N/A N/A Primary Etiology: Diabetic Wound/Ulcer of the Lower  N/A N/A Extremity Comorbid History: Arrhythmia, Congestive Heart N/A N/A Failure, Coronary Artery Disease, Hypertension, Myocardial Infarction, Peripheral Venous Disease, Type II Diabetes, Neuropathy Date Acquired: 10/16/2020 N/A N/A Weeks of Treatment: 1 N/A N/A Wound Status: Open N/A N/A Measurements L x W x D (cm) 9.7x6x0.1 N/A N/A Area (cm) : 45.71 N/A N/A Volume (cm) : 4.571 N/A N/A % Reduction in Area: -5.00% N/A N/A % Reduction in Volume: -5.00% N/A N/A Classification: Grade 1 N/A N/A Exudate Amount: Medium N/A N/A Exudate Type: Serous N/A N/A Exudate Color: amber N/A N/A Wound Margin: N/A N/A N/A Granulation Amount: Large (67-100%) N/A N/A Granulation Quality: Red, Pink N/A N/A Necrotic Amount: Small (1-33%) N/A N/A Necrotic Tissue: Eschar, Adherent Slough N/A N/A Exposed Structures: Fat Layer (Subcutaneous Tissue): N/A N/A Yes Fascia:  No Tendon: No Muscle: No Joint: No Bone: No Epithelialization: N/A N/A N/A Treatment Notes Electronic Signature(s) Signed: 10/26/2020 5:35:33 PM By: Georges Mouse, Minus Breeding RN Entered By: Georges Mouse, Kenia on 10/26/2020 14:54:53 Ralph Peterson ([id number]) -------------------------------------------------------------------------------- Multi-Disciplinary Care Plan Details Patient Name: Ralph Peterson Date of Service: 10/26/2020 1:30 PM Medical Record Number: [id number] Patient Account Number: [id number] Date of Birth/Sex: 11/15/1965 (55 y.o. M) Treating RN: Dolan Amen Primary Care Crue Otero: Vidal Schwalbe Other Clinician: Jeanine Luz Referring Eathen Budreau: Vidal Schwalbe Treating Sophiana Milanese/Extender: Skipper Cliche in Treatment: 26 Active Inactive Necrotic Tissue Nursing Diagnoses: Impaired tissue integrity related to necrotic/devitalized tissue Knowledge deficit related to management of necrotic/devitalized tissue Goals: Necrotic/devitalized tissue will be minimized in the wound bed Date Initiated: 05/09/2020 Target Resolution Date: 05/09/2020 Goal Status: Active Interventions: Assess patient pain level pre-, during and post procedure and prior to discharge Provide education on necrotic tissue and debridement process Treatment Activities: Enzymatic debridement : 05/09/2020 Notes: Electronic Signature(s) Signed: 10/26/2020 5:35:33 PM By: Georges Mouse, Minus Breeding RN Entered By: Georges Mouse, Minus Breeding on 10/26/2020 14:54:44 Ralph Peterson ([id number]) -------------------------------------------------------------------------------- Pain Assessment Details Patient Name: Ralph Peterson. Date of Service: 10/26/2020 1:30 PM Medical Record Number: [id number] Patient Account Number: [id number] Date of Birth/Sex: [birth year] (55 y.o. M) Treating RN: Dolan Amen Primary Care Chieko Neises: Vidal Schwalbe Other Clinician: Jeanine Luz Referring  Dquan Cortopassi: Vidal Schwalbe Treating Isacc Turney/Extender: Skipper Cliche in Treatment: 26 Active Problems Location of Pain Severity and Description of Pain Patient Has Paino No Site Locations Rate the pain. Current Pain Level: 0 Pain Management and Medication Current Pain Management: Electronic Signature(s) Signed: 10/26/2020 5:35:33 PM By: Georges Mouse, Minus Breeding RN Signed: 10/27/2020 8:09:31 AM By: Jeanine Luz Entered By: Jeanine Luz on 10/26/2020 13:35:13 Ralph Peterson ([id number]) -------------------------------------------------------------------------------- Patient/Caregiver Education Details Patient Name: Ralph Peterson Date of Service: 10/26/2020 1:30 PM Medical Record Number: [id number] Patient Account Number: [id number] Date of Birth/Gender: 10/23/1965 (55 y.o. M) Treating RN: Dolan Amen Primary Care Physician: Vidal Schwalbe Other Clinician: Jeanine Luz Referring Physician: Vidal Schwalbe Treating Physician/Extender: Skipper Cliche in Treatment: 62 Education Assessment Education Provided To: Patient Education Topics Provided Wound/Skin Impairment: Methods: Explain/Verbal Responses: State content correctly Electronic Signature(s) Signed: 10/26/2020 5:35:33 PM By: Georges Mouse, Minus Breeding RN Entered By: Georges Mouse, Minus Breeding on 10/26/2020 15:09:51 Ralph Peterson ([id number]) -------------------------------------------------------------------------------- Wound Assessment Details Patient Name: Ralph Peterson Date of Service: 10/26/2020 1:30 PM Medical Record Number: [id number] Patient Account Number: [id number] Date of Birth/Sex: 24-Sep-1965 (55 y.o. M) Treating RN: Dolan Amen Primary Care Argusta Mcgann: Vidal Schwalbe Other Clinician: Jeanine Luz Referring Avriana Joo: Vidal Schwalbe Treating Laterica Matarazzo/Extender: Skipper Cliche in Treatment: 26 Wound Status Wound Number: 1 Primary Pressure Ulcer Etiology: Wound Location:  Right, Distal, Lateral Foot Wound Open Wounding Event: Pressure Injury Status: Date Acquired: 02/20/2020 Comorbid Arrhythmia, Congestive Heart Failure, Coronary Artery Weeks Of Treatment: 26 History: Disease, Hypertension, Myocardial Infarction, Peripheral Clustered Wound: No Venous Disease, Type II Diabetes, Neuropathy Photos Wound Measurements Length: (cm) 0.5 Width: (cm) 1 Depth: (cm) 0.2 Area: (cm) 0.393 Volume: (cm) 0.079 % Reduction in Area: 92.2% % Reduction in Volume: 96.9% Epithelialization: None Tunneling: No Undermining: No Wound Description Classification: Category/Stage IV Wound Margin: Distinct, outline attached Exudate Amount: Small Exudate Type: Serous Exudate Color: amber Foul Odor After Cleansing: No Slough/Fibrino Yes Wound Bed Granulation Amount: Small (1-33%) Exposed Structure Granulation Quality: Pink Fascia Exposed: No Necrotic Amount: Large (67-100%) Fat Layer (Subcutaneous Tissue) Exposed: Yes Necrotic Quality: Adherent Slough Tendon Exposed: No Muscle Exposed: No Joint Exposed: No Bone Exposed: No Treatment Notes Wound #1 (Foot) Wound Laterality: Right, Lateral, Distal Cleanser Normal Saline Discharge Instruction: Wash your hands with soap and water. Remove old dressing, discard into plastic bag and place into trash. Cleanse the wound with Normal Saline prior to applying a clean dressing using gauze sponges, not tissues or cotton balls. Do not Speltz, Dalvin S. ([id number]) scrub or use excessive force. Pat dry using gauze sponges, not tissue or cotton balls. Peri-Wound Care Topical Primary Dressing Prisma 4.34 (in) Discharge Instruction: Moisten with hydrogel Curad Oil Emulsion Dressing 3x3 (in/in) Discharge Instruction: Apply on top of prisma Secondary Dressing ABD Pad 5x9 (in/in) Discharge Instruction: Cover with ABD pad Secured With 69M [place] Surgical Tape, 2x2 (in/yd) Discharge Instruction: Secure gauze  bandage Kerlix Roll Sterile or Non-Sterile 6-ply 4.5x4 (yd/yd) Discharge Instruction: Apply Kerlix as directed Compression Wrap Compression Stockings Add-Ons Electronic Signature(s) Signed: 10/26/2020 5:35:33 PM By: Georges Mouse, Minus Breeding RN Signed: 10/27/2020 8:09:31 AM By: Jeanine Luz Entered By: Jeanine Luz on 10/26/2020 13:53:40 Ralph Peterson ([id number]) -------------------------------------------------------------------------------- Wound Assessment Details Patient Name: Ralph Peterson. Date of Service: 10/26/2020 1:30 PM Medical Record Number: [id number] Patient Account Number: [id number] Date of Birth/Sex: May 18, 1966 (55 y.o. M) Treating RN: Dolan Amen Primary Care Mckennah Kretchmer: Vidal Schwalbe Other Clinician: Jeanine Luz Referring Vaneta Hammontree: Vidal Schwalbe Treating Deanna Boehlke/Extender: Skipper Cliche in Treatment: 26 Wound Status Wound Number: 2R Primary Open Surgical Wound Etiology: Wound Location: Right, Distal, Dorsal Foot Wound Open Wounding Event: Surgical Injury Status: Date Acquired: 04/20/2020 Comorbid Arrhythmia, Congestive Heart Failure, Coronary Artery Weeks Of Treatment: 26 History: Disease, Hypertension, Myocardial Infarction, Peripheral Clustered Wound: No Venous Disease, Type II Diabetes, Neuropathy Wound Measurements Length: (cm) 0.5 Width: (cm) 0.3 Depth: (cm) 0.5 Area: (cm) 0.118 Volume: (cm) 0.059 % Reduction in Area: 97.5% % Reduction in Volume: 95.8% Epithelialization: Medium (34-66%) Tunneling: No Undermining: No Wound Description Classification: Full Thickness Without Exposed Support Structures Wound Margin: Distinct, outline attached Exudate Amount: Medium Exudate Type: Sanguinous Exudate Color: red Foul Odor After Cleansing: No Slough/Fibrino No Wound Bed Granulation Amount: Large (67-100%) Exposed Structure Granulation Quality: Red Fascia Exposed: No Necrotic Amount: None Present (0%) Fat Layer  (Subcutaneous Tissue) Exposed: No Tendon Exposed: No Muscle Exposed: No Joint Exposed: No Bone Exposed: No Treatment Notes Wound #2R (Foot) Wound Laterality: Dorsal, Right, Distal Cleanser Normal Saline Discharge Instruction: Wash your hands with soap and water. Remove old dressing, discard into plastic bag and place into trash. Cleanse the wound with Normal Saline prior to applying a clean dressing using gauze sponges, not tissues or cotton balls. Do not scrub or use excessive force. Pat dry using gauze sponges, not tissue or cotton balls. Peri-Wound Care Topical Primary  Dressing Hydrofera Blue Classic Foam Rope Dressing, 9x6 (mm/in) Discharge Instruction: Cut rope in half, 2 cm in length-pack in wound, moisten with a couple drops of saline after inserted (extras sent with patient) Secondary Dressing ABD Pad 5x9 (in/in) Discharge Instruction: Cover with ABD pad Secured With ORHAN, FINGAR ([id number]) 6M [place] Surgical Tape, 2x2 (in/yd) Discharge Instruction: Secure gauze bandage Kerlix Roll Sterile or Non-Sterile 6-ply 4.5x4 (yd/yd) Discharge Instruction: Apply Kerlix as directed Compression Wrap Compression Stockings Add-Ons Electronic Signature(s) Signed: 10/26/2020 5:35:33 PM By: Georges Mouse, Minus Breeding RN Entered By: Georges Mouse, Kenia on 10/26/2020 15:05:03 Ralph Peterson ([id number]) -------------------------------------------------------------------------------- Wound Assessment Details Patient Name: Ralph Peterson. Date of Service: 10/26/2020 1:30 PM Medical Record Number: [id number] Patient Account Number: [id number] Date of Birth/Sex: 1966/06/14 (55 y.o. M) Treating RN: Dolan Amen Primary Care Larence Thone: Vidal Schwalbe Other Clinician: Jeanine Luz Referring Beva Remund: Vidal Schwalbe Treating Natasha Burda/Extender: Skipper Cliche in Treatment: 26 Wound Status Wound Number: 7 Primary Diabetic Wound/Ulcer of the Lower  Extremity Etiology: Wound Location: Right, Dorsal Foot Wound Healed - Epithelialized Wounding Event: Blister Status: Date Acquired: 09/05/2020 Comorbid Arrhythmia, Congestive Heart Failure, Coronary Artery Weeks Of Treatment: 7 History: Disease, Hypertension, Myocardial Infarction, Peripheral Clustered Wound: No Venous Disease, Type II Diabetes, Neuropathy Photos Wound Measurements Length: (cm) 0 Width: (cm) 0 Depth: (cm) 0 Area: (cm) 0 Volume: (cm) 0 % Reduction in Area: 100% % Reduction in Volume: 100% Epithelialization: Large (67-100%) Tunneling: No Undermining: No Wound Description Classification: Grade 2 Wound Margin: Flat and Intact Exudate Amount: None Present Foul Odor After Cleansing: No Slough/Fibrino No Wound Bed Granulation Amount: None Present (0%) Exposed Structure Necrotic Amount: None Present (0%) Fascia Exposed: No Fat Layer (Subcutaneous Tissue) Exposed: No Tendon Exposed: No Muscle Exposed: No Joint Exposed: No Bone Exposed: No Electronic Signature(s) Signed: 10/26/2020 5:35:33 PM By: Georges Mouse, Minus Breeding RN Entered By: Georges Mouse, Kenia on 10/26/2020 14:54:00 Ralph Peterson ([id number]) -------------------------------------------------------------------------------- Wound Assessment Details Patient Name: Ralph Peterson. Date of Service: 10/26/2020 1:30 PM Medical Record Number: [id number] Patient Account Number: [id number] Date of Birth/Sex: 04-24-1966 (55 y.o. M) Treating RN: Dolan Amen Primary Care Opie Fanton: Vidal Schwalbe Other Clinician: Jeanine Luz Referring Amea Mcphail: Vidal Schwalbe Treating Kennice Finnie/Extender: Skipper Cliche in Treatment: 26 Wound Status Wound Number: 8 Primary Pressure Ulcer Etiology: Wound Location: Right, Proximal, Lateral Foot Wound Open Wounding Event: Gradually Appeared Status: Date Acquired: 09/07/2020 Comorbid Arrhythmia, Congestive Heart Failure, Coronary Artery Weeks Of  Treatment: 4 History: Disease, Hypertension, Myocardial Infarction, Peripheral Clustered Wound: No Venous Disease, Type II Diabetes, Neuropathy Photos Wound Measurements Length: (cm) 0.4 Width: (cm) 0.3 Depth: (cm) 0.2 Area: (cm) 0.094 Volume: (cm) 0.019 % Reduction in Area: 25.4% % Reduction in Volume: -46.2% Tunneling: No Undermining: No Wound Description Classification: Unstageable/Unclassified Wound Margin: Flat and Intact Exudate Amount: Small Exudate Type: Serous Exudate Color: amber Foul Odor After Cleansing: No Slough/Fibrino Yes Wound Bed Granulation Amount: Medium (34-66%) Exposed Structure Granulation Quality: Pink Fascia Exposed: No Necrotic Amount: Medium (34-66%) Fat Layer (Subcutaneous Tissue) Exposed: Yes Necrotic Quality: Eschar, Adherent Slough Tendon Exposed: No Muscle Exposed: No Joint Exposed: No Bone Exposed: No Treatment Notes Wound #8 (Foot) Wound Laterality: Right, Lateral, Proximal Cleanser Normal Saline Discharge Instruction: Wash your hands with soap and water. Remove old dressing, discard into plastic bag and place into trash. Cleanse the wound with Normal Saline prior to applying a clean dressing using gauze sponges, not tissues or cotton balls. Do not Benkert, Tarrin S. ([id number]) scrub or use  excessive force. Pat dry using gauze sponges, not tissue or cotton balls. Peri-Wound Care Topical Primary Dressing Prisma 4.34 (in) Discharge Instruction: Moisten with hydrogel Curad Oil Emulsion Dressing 3x3 (in/in) Discharge Instruction: Apply on top of prisma Secondary Dressing ABD Pad 5x9 (in/in) Discharge Instruction: Cover with ABD pad Secured With 57M [place] Surgical Tape, 2x2 (in/yd) Discharge Instruction: Secure gauze bandage Kerlix Roll Sterile or Non-Sterile 6-ply 4.5x4 (yd/yd) Discharge Instruction: Apply Kerlix as directed Compression Wrap Compression Stockings Add-Ons Electronic Signature(s) Signed:  10/26/2020 5:35:33 PM By: Georges Mouse, Minus Breeding RN Signed: 10/27/2020 8:09:31 AM By: Jeanine Luz Entered By: Jeanine Luz on 10/26/2020 13:55:14 Ralph Peterson ([id number]) -------------------------------------------------------------------------------- Wound Assessment Details Patient Name: Ralph Peterson. Date of Service: 10/26/2020 1:30 PM Medical Record Number: [id number] Patient Account Number: [id number] Date of Birth/Sex: Jan 03, 1966 (55 y.o. M) Treating RN: Dolan Amen Primary Care Augusta Hilbert: Vidal Schwalbe Other Clinician: Jeanine Luz Referring Jilian West: Vidal Schwalbe Treating Charlott Calvario/Extender: Skipper Cliche in Treatment: 26 Wound Status Wound Number: 9 Primary Diabetic Wound/Ulcer of the Lower Extremity Etiology: Wound Location: Right, Medial Lower Leg Wound Open Wounding Event: Blister Status: Date Acquired: 10/16/2020 Comorbid Arrhythmia, Congestive Heart Failure, Coronary Artery Weeks Of Treatment: 1 History: Disease, Hypertension, Myocardial Infarction, Peripheral Clustered Wound: No Venous Disease, Type II Diabetes, Neuropathy Photos Wound Measurements Length: (cm) 9.7 Width: (cm) 6 Depth: (cm) 0.1 Area: (cm) 45.71 Volume: (cm) 4.571 % Reduction in Area: -5% % Reduction in Volume: -5% Tunneling: No Undermining: No Wound Description Classification: Grade 1 Exudate Amount: Medium Exudate Type: Serous Exudate Color: amber Foul Odor After Cleansing: No Slough/Fibrino Yes Wound Bed Granulation Amount: Large (67-100%) Exposed Structure Granulation Quality: Red, Pink Fascia Exposed: No Necrotic Amount: Small (1-33%) Fat Layer (Subcutaneous Tissue) Exposed: Yes Necrotic Quality: Eschar, Adherent Slough Tendon Exposed: No Muscle Exposed: No Joint Exposed: No Bone Exposed: No Treatment Notes Wound #9 (Lower Leg) Wound Laterality: Right, Medial Cleanser Normal Saline Discharge Instruction: Wash your hands with soap and water.  Remove old dressing, discard into plastic bag and place into trash. Cleanse the wound with Normal Saline prior to applying a clean dressing using gauze sponges, not tissues or cotton balls. Do not scrub or use excessive force. Pat dry using gauze sponges, not tissue or cotton balls. EBENEZER, BRUINSMA ([id number]) Peri-Wound Care Topical Primary Dressing Curad Oil Emulsion Dressing 3x3 (in/in) Discharge Instruction: Apply directly on wound bed Secondary Dressing ABD Pad 5x9 (in/in) Discharge Instruction: Cover with ABD pad Secured With Compression Wrap Compression Stockings Add-Ons Electronic Signature(s) Signed: 10/26/2020 5:35:33 PM By: Georges Mouse, Minus Breeding RN Signed: 10/27/2020 8:09:31 AM By: Jeanine Luz Entered By: Jeanine Luz on 10/26/2020 13:55:53 Ralph Peterson ([id number]) -------------------------------------------------------------------------------- Vitals Details Patient Name: Ralph Peterson. Date of Service: 10/26/2020 1:30 PM Medical Record Number: [id number] Patient Account Number: [id number] Date of Birth/Sex: 13-Sep-1965 (55 y.o. M) Treating RN: Dolan Amen Primary Care Owin Vignola: Vidal Schwalbe Other Clinician: Jeanine Luz Referring Freemon Binford: Vidal Schwalbe Treating Junia Nygren/Extender: Skipper Cliche in Treatment: 26 Vital Signs Time Taken: 13:32 Temperature (F): 98.7 Height (in): 72 Pulse (bpm): 75 Weight (lbs): 225 Respiratory Rate (breaths/min): 18 Body Mass Index (BMI): 30.5 Blood Pressure (mmHg): 140/83 Reference Range: 80 - 120 mg / dl Electronic Signature(s) Signed: 10/27/2020 8:09:31 AM By: Jeanine Luz Entered By: Jeanine Luz on 10/26/2020 13:35:03

## 2020-11-02 ENCOUNTER — Encounter: Payer: Medicare PPO | Admitting: Physician Assistant

## 2020-11-02 ENCOUNTER — Other Ambulatory Visit: Payer: Self-pay

## 2020-11-02 DIAGNOSIS — E11621 Type 2 diabetes mellitus with foot ulcer: Secondary | ICD-10-CM | POA: Diagnosis not present

## 2020-11-02 NOTE — Progress Notes (Addendum)
PAUBLO, ZIMMERLE ([id number]) Visit Report for 11/02/2020 Chief Complaint Document Details Patient Name: Ralph Peterson, Ralph Peterson. Date of Service: 11/02/2020 9:00 AM Medical Record Number: [id number] Patient Account Number: [id number] Date of Birth/Sex: 1966-05-12 (55 y.o. 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 M) Treating RN: Dolan Amen Primary Care Provider: Vidal Schwalbe Other Clinician: Referring Provider: Vidal Schwalbe Treating Provider/Extender: Skipper Cliche in Treatment: 27 Debridement Performed for Wound #8 Right,Proximal,Lateral Foot Assessment: Performed By: Physician Tommie Sams., PA-C Debridement Type: Debridement Level of Consciousness (Pre- Awake and Alert procedure): Pre-procedure Verification/Time Out Yes - 09:57 Taken: Start Time: 09:57 Pain Control: Lidocaine 4% Topical Solution Total Area Debrided (L x W): 0.4 (cm) x 0.2 (cm) = 0.08 (cm) Tissue and other material Viable, Non-Viable, Slough, Subcutaneous, Skin: Epidermis, Slough debrided: Level: Skin/Subcutaneous Tissue Debridement Description: Excisional Instrument: Curette Bleeding: Minimum Hemostasis Achieved: Pressure Response to Treatment: Procedure was tolerated  well Level of Consciousness (Post- Awake and Alert procedure): Post Debridement Measurements of Total Wound Length: (cm) 0.4 Stage: Category/Stage III Width: (cm) 0.3 Depth: (cm) 0.2 Volume: (cm) 0.019 Character of Wound/Ulcer Post Debridement: Stable Post Procedure Diagnosis Same as Pre-procedure Electronic Signature(s) Signed: 11/02/2020 12:59:40 PM By: Worthy Keeler PA-C Signed: 11/02/2020 5:28:26 PM By: Georges Mouse, Minus Breeding RN Entered By: Georges Mouse, Kenia on 11/02/2020 09:59:21 Ralph Peterson ([id number]) -------------------------------------------------------------------------------- Debridement Details Patient Name: Ralph Peterson. Date of Service: 11/02/2020 9:00 AM Medical Record Number: [id number] Patient Account Number: [id number] Date of Birth/Sex: 09/09/1965 (55 y.o. M) Treating RN: Dolan Amen Primary Care Provider: Vidal Schwalbe Other Clinician: Referring Provider: Vidal Schwalbe Treating Provider/Extender: Skipper Cliche in Treatment: 27 Debridement Performed for Wound #1 Right,Distal,Lateral Foot Assessment: Performed By: Physician Tommie Sams., PA-C Debridement Type: Debridement Level of Consciousness (Pre- Awake and Alert procedure): Pre-procedure Verification/Time Out Yes - 09:57 Taken: Start Time: 09:57 Pain Control: Lidocaine 4% Topical Solution Total Area Debrided (L x W): 1.1 (cm) x 0.4 (cm) = 0.44 (cm) Tissue and other material Viable, Non-Viable, Callus, Slough, Skin: Epidermis, Slough debrided: Level: Skin/Epidermis Debridement Description: Selective/Open Wound Instrument: Curette Bleeding: Minimum Hemostasis Achieved: Pressure Response to Treatment: Procedure was tolerated well Level of Consciousness (Post- Awake and Alert procedure): Post Debridement Measurements of Total Wound Length: (cm) 1.1 Stage: Category/Stage IV Width: (cm) 0.4 Depth: (cm) 0.1 Volume: (cm) 0.035 Character of Wound/Ulcer Post  Debridement: Stable Post Procedure Diagnosis Same as Pre-procedure Electronic Signature(s) Signed: 11/02/2020 12:59:40 PM By: Worthy Keeler PA-C Signed: 11/02/2020 5:28:26 PM By: Georges Mouse, Minus Breeding RN Entered By: Georges Mouse, Minus Breeding on 11/02/2020 10:02:50 Ralph Peterson ([id number]) -------------------------------------------------------------------------------- HPI Details Patient Name: Ralph Peterson Date of Service: 11/02/2020 9:00 AM Medical Record Number: [id number] Patient Account Number: [id number] Date of Birth/Sex: 03/19/1966 (55 y.o. M) Treating RN: Dolan Amen Primary Care Provider: Vidal Schwalbe Other Clinician: Referring Provider: Vidal Schwalbe Treating Provider/Extender: Skipper Cliche in Treatment: 27 History of Present Illness HPI Description: 08/17/2020 upon evaluation today patient actually appears to be doing decently  well today which is good news in regard to the dorsal wound which is almost healed and the lateral wound also doing much better. With regard to the plantar wound this is still draining purulent drainage unfortunately the culture did reveal that he had Pseudomonas as well as Enterococcus. With that being said he is allergic to penicillin which took away the only oral medication for the Enterococcus I did look at linezolid as a possibility but unfortunately there were medication interactions. Also the same was true the Cipro where there were medication interactions. Nonetheless I want to see if we can get him into infectious disease to see if they can work with him to try to find possibly add at home IV antibiotic regimen that can help out at this point. No sharp debridement is can be necessary today. 09/07/2020 upon evaluation today patient appears to be doing unfortunately not too well in regard to his wound. He has been tolerating the dressing changes without complication. Fortunately there is no sign of active infection at this  time. No fevers, chills, nausea, vomiting, or diarrhea. The patient did have a repeat surgery going to clean out the abscess and try to work on getting things moving in a better direction about a week and a half ago. He still has sutures in place. With that being said unfortunately he does not appear to be showing signs of a lot of improvement in my opinion. He does have sutures on the plantar aspect still in place and on the dorsal aspect he has a small opening which actually probes all the way down through and actually came out of the plantar aspect. I feel like that he really is having a lot of issues here and I am afraid that if we do not get this taken care of this is can end up with him having a much larger and more extensive amputation than what he was hoping to have to deal with with this. It has already been since June 2021 but has been dealing with this including all the time since the amputation. 09/14/2020 upon evaluation today patient appears to be doing really about the same in regard to the wound on the dorsal surface of his foot. The plantar foot unfortunately still has sutures in place I really cannot see how things are doing in that regard. With the dorsal foot however this appears to show signs of still having a small opening in the distal aspect which does actually probe down to bone there is also significant purulent drainage we did actually obtain a wound culture today to further evaluate the issue here. 09/21/2020 upon evaluation today patient appears to be doing about the same in regard to his wound on the dorsal surface of his foot and the plantar foot he still does not have the sutures out at this point. That should hopefully happen in the next week. With that being said the main issue is he continues to have drainage here. It was noted that he had positive findings on culture for Enterobacter. With that being said he cannot take oral penicillin he could do vancomycin and I think  that is good to be the ideal thing this to see if we can get him started on IV vancomycin at dialysis. We will get a get in touch with them to try to see if we can coordinate that being done there at the dialysis center. I think that is the ideal situation at this point I would probably start  with just 2 weeks of therapy. 09/28/2020 upon evaluation today patient appears to be doing well with regard to his wounds in general. He also has back on the vancomycin after the culture results and this seems to be doing excellent for him. All the wounds are showing signs of good improvement. No fevers, chills, nausea, vomiting, or diarrhea. 10/05/2020 upon evaluation today patient appears to actually be doing excellent in regard to his foot. Fortunately there is a lot of signs of new granulation epithelization at this point. There does not appear to be any evidence of active infection which is great news and overall very pleased with where things stand. In fact the open wound along the plantar aspect of his foot as well as the dorsal part of his foot has dramatically improved since we went forward with the vancomycin and requested that the dialysis center provide this to him by way of IV antibiotics with his dialysis. Overall I think that has made a dramatic improvement and overall his wound seems to be doing significantly better at this point. No fevers, chills, nausea, vomiting, or diarrhea. The patient was set to have his second opinion appointment with podiatry [facility] next week but to be perfectly honest I am not even certain that that is necessary at this point I do not think there is much that they would be able to do for him nor that he really would want them to do considering how well things look at this time. 10/12/2020 on evaluation today patient appears to be doing excellent in regard to his wounds. In fact the only issue I see is that everything is dried out as far as the collagen is concerned. I do think  we can try to keep this moist he will continue to make excellent progress as he has been. Overall I am extremely pleased with what I see today. 10/19/2020 upon evaluation today patient appears to be doing decently well in regard to the wounds on his foot he is can require some debridement in this location. Unfortunately is having some issues with blistering over the leg which is of greater concern at this point. Fortunately I do not see any signs of active infection which is great news. Nonetheless the blistering may just be related to venous insufficiency or subsequently could potentially be an issue with overall worsening with regard to an infection but again it is not really hot to touch. 10/26/2020 upon evaluation today patient appears to be doing well with regard to the majority of his wounds. Unfortunately the dorsal surface of his foot does not appear to be doing quite as well today compared to what it has been doing. I do think that we will need to address that currently. Everything else is showing signs of improvement. He did not get the prescription that I gave him previous as unfortunately the transmission failed. I did not know that until today when the patient mention to me that he never got the antibiotic. 11/02/20 upon evaluation today patient appears to be doing much better in regard to his wounds in general. I feel like that he is making good progress and even the top of the foot does not appear to be doing too poorly at this time. He did finally get the antibiotics which is good. Unfortunately it does not look like I am able to electronically sent to his pharmacy it kept being an error in transmission every time this was sent. We finally decided to call it in. Nonetheless  overall I think he is headed in the right direction based on what I am seeing. Electronic Signature(s) Signed: 11/02/2020 10:32:51 AM By: Ferd Hibbs ([id number]) Entered By: Worthy Keeler on 11/02/2020 10:32:51 Ralph Peterson ([id number]) -------------------------------------------------------------------------------- Physical Exam Details Patient Name: Ralph Peterson Date of Service: 11/02/2020 9:00 AM Medical Record Number: [id number] Patient Account Number: [id number] Date of Birth/Sex: 07-16-1966 (55 y.o. M) Treating RN: Dolan Amen Primary Care Provider: Vidal Schwalbe Other Clinician: Referring Provider: Vidal Schwalbe Treating Provider/Extender: Skipper Cliche in Treatment: 24 Constitutional Well-nourished and well-hydrated in no acute distress. Respiratory normal breathing without difficulty. Psychiatric this patient is able to make decisions and demonstrates good insight into disease process. Alert and Oriented x 3. pleasant and cooperative. Notes Upon inspection patient's wound bed actually showed signs of good granulation epithelization at this point. There does not appear to be any signs of infection which is great news and overall very pleased with where things stand today. I did perform debridement over the proximal lateral foot as well as the distal lateral foot and both areas post debridement appear to be doing much better which is great news. Electronic Signature(s) Signed: 11/02/2020 10:33:44 AM By: Worthy Keeler PA-C Entered By: Worthy Keeler on 11/02/2020 10:33:43 Ralph Peterson ([id number]) -------------------------------------------------------------------------------- Physician Orders Details Patient Name: Ralph Peterson Date of Service: 11/02/2020 9:00 AM Medical Record Number: [id number] Patient Account Number: [id number] Date of Birth/Sex: [birth year] (55 y.o. 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 Bathing/ Shower/ Hygiene o Clean wound with Normal Saline or wound cleanser. Off-Loading o Open toe surgical shoe with peg assist. - Right foot o Other: - Betadine on heel, wear socks for protection Wound Treatment Wound #1 - Foot Wound Laterality: Right, Lateral, Distal Cleanser: Normal Saline 3 x Per Week/30 Days Discharge Instructions: Wash your hands with soap and water. Remove old dressing, discard into plastic bag and place into trash. Cleanse the wound with Normal Saline prior to applying a clean dressing using gauze sponges, not tissues or cotton balls. Do not scrub or use excessive force. Pat dry using gauze sponges, not tissue or cotton balls. Primary Dressing: Curad Oil Emulsion Dressing 3x3 (in/in) 3 x Per Week/30 Days Discharge Instructions: Apply on wound bed Secondary Dressing: ABD Pad 5x9 (in/in) 3 x Per Week/30 Days Discharge Instructions: Cover with ABD pad Secured With: 31M Medipore H Soft Cloth Surgical Tape, 2x2 (in/yd) 3 x Per Week/30 Days Discharge Instructions: Secure Kerlix Secured With: [employer] Sterile or Non-Sterile 6-ply 4.5x4 (yd/yd) 3 x Per Week/30 Days Discharge Instructions: Apply Kerlix as directed Secured With: Tubigrip Size E, 3.5x10 (in/yds) 3 x Per Week/30 Days Discharge Instructions: Apply 3 Tubigrip E 3-finger-widths below knee to base of  toes to secure dressing and/or for swelling. Wound #2R - Foot Wound  Laterality: Dorsal, Right, Distal Cleanser: Normal Saline 3 x Per Week/30 Days Discharge Instructions: Wash your hands with soap and water. Remove old dressing, discard into plastic bag and place into trash. Cleanse the wound with Normal Saline prior to applying a clean dressing using gauze sponges, not tissues or cotton balls. Do not scrub or use excessive force. Pat dry using gauze sponges, not tissue or cotton balls. Primary Dressing: Hydrofera Blue Classic Foam Rope Dressing, 9x6 (mm/in) 3 x Per Week/30 Days Discharge Instructions: Cut rope in half, 2 cm in length-pack in wound, moisten with a couple drops of saline after inserted (extras sent with patient) YORK, HARCUM ([id number]) Secondary Dressing: ABD Pad 5x9 (in/in) 3 x Per Week/30 Days Discharge Instructions: Cover with ABD pad Secured With: 254M Medipore H Soft Cloth Surgical Tape, 2x2 (in/yd) 3 x Per Week/30 Days Discharge Instructions: Secure Kerlix Secured With: [employer] Sterile or Non-Sterile 6-ply 4.5x4 (yd/yd) 3 x Per Week/30 Days Discharge Instructions: Apply Kerlix as directed Secured With: Tubigrip Size E, 3.5x10 (in/yds) 3 x Per Week/30 Days Discharge Instructions: Apply 3 Tubigrip E 3-finger-widths below knee to base of toes to secure dressing and/or for swelling. Wound #8 - Foot Wound Laterality: Right, Lateral, Proximal Cleanser: Normal Saline 3 x Per Week/30 Days Discharge Instructions: Wash your hands with soap and water. Remove old dressing, discard into plastic bag and place into trash. Cleanse the wound with Normal Saline prior to applying a clean dressing using gauze sponges, not tissues or cotton balls. Do not scrub or use excessive force. Pat dry using gauze sponges, not tissue or cotton balls. Primary Dressing: Curad Oil Emulsion Dressing 3x3 (in/in) 3 x Per Week/30 Days Discharge Instructions: Apply on wound bed Secondary  Dressing: ABD Pad 5x9 (in/in) 3 x Per Week/30 Days Discharge Instructions: Cover with ABD pad Secured With: 254M Medipore H Soft Cloth Surgical Tape, 2x2 (in/yd) 3 x Per Week/30 Days Discharge Instructions: Secure Kerlix Secured With: [employer] Sterile or Non-Sterile 6-ply 4.5x4 (yd/yd) 3 x Per Week/30 Days Discharge Instructions: Apply Kerlix as directed Secured With: Tubigrip Size E, 3.5x10 (in/yds) 3 x Per Week/30 Days Discharge Instructions: Apply 3 Tubigrip E 3-finger-widths below knee to base of toes to secure dressing and/or for swelling. Wound #9 - Lower Leg Wound Laterality: Right, Medial Cleanser: Normal Saline 3 x Per Week/30 Days Discharge Instructions: Wash your hands with soap and water. Remove old dressing, discard into plastic bag and place into trash. Cleanse the wound with Normal Saline prior to applying a clean dressing using gauze sponges, not tissues or cotton balls. Do not scrub or use excessive force. Pat dry using gauze sponges, not tissue or cotton balls. Primary Dressing: Curad Oil Emulsion Dressing 3x3 (in/in) 3 x Per Week/30 Days Discharge Instructions: Apply on wound bed Secondary Dressing: ABD Pad 5x9 (in/in) 3 x Per Week/30 Days Discharge Instructions: Cover with ABD pad Secured With: 254M Medipore H Soft Cloth Surgical Tape, 2x2 (in/yd) 3 x Per Week/30 Days Discharge Instructions: Secure Kerlix Secured With: [employer] Sterile or Non-Sterile 6-ply 4.5x4 (yd/yd) 3 x Per Week/30 Days Discharge Instructions: Apply Kerlix as directed Secured With: Tubigrip Size E, 3.5x10 (in/yds) 3 x Per Week/30 Days Discharge Instructions: Apply 3 Tubigrip E 3-finger-widths below knee to base of toes to secure dressing and/or for swelling. Electronic Signature(s) Signed: 11/02/2020 12:59:40 PM By: Worthy Keeler PA-C Signed: 11/02/2020 5:28:26 PM By: Georges Mouse, Minus Breeding RN Entered By: Georges Mouse, Minus Breeding on 11/02/2020 10:07:34 Ralph Peterson  ([id number]) --------------------------------------------------------------------------------  Problem List Details Patient Name: SAVALAS, WIDMAN. Date of Service: 11/02/2020 9:00 AM Medical Record Number: [id number] Patient Account Number: [id number] Date of Birth/Sex: 1965-11-14 (55 y.o. M) Treating RN: Dolan Amen Primary Care Provider: Vidal Schwalbe Other Clinician: Referring Provider: Vidal Schwalbe Treating Provider/Extender: Skipper Cliche in Treatment: 27 Active Problems ICD-10 Encounter Code Description Active Date MDM Diagnosis L89.893 Pressure ulcer of other site, stage 3 04/27/2020 No Yes T81.31XA Disruption of external operation (surgical) wound, not elsewhere 04/27/2020 No Yes classified, initial encounter L97.512 Non-pressure chronic ulcer of other part of right foot with fat layer 04/27/2020 No Yes exposed L97.812 Non-pressure chronic ulcer of other part of right lower leg with fat layer 04/27/2020 No Yes exposed E11.621 Type 2 diabetes mellitus with foot ulcer 04/27/2020 No Yes I50.42 Chronic combined systolic (congestive) and diastolic (congestive) heart 04/27/2020 No Yes failure N18.6 End stage renal disease 04/27/2020 No Yes Z99.2 Dependence on renal dialysis 04/27/2020 No Yes Inactive Problems Resolved Problems Electronic Signature(s) Signed: 11/02/2020 9:12:08 AM By: Worthy Keeler PA-C Entered By: Worthy Keeler on 11/02/2020 09:12:07 Ralph Peterson ([id number]) -------------------------------------------------------------------------------- Progress Note Details Patient Name: Ralph Peterson Date of Service: 11/02/2020 9:00 AM Medical Record Number: [id number] Patient Account Number: [id number] Date of Birth/Sex: [birth year] (55 y.o. 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 With regard to the plantar wound this is still draining purulent drainage unfortunately the culture did reveal that he had Pseudomonas as well as Enterococcus. With that being said he is allergic to penicillin which took away the only oral medication for the Enterococcus I did look at linezolid as a possibility but unfortunately there were medication interactions. Also the same was true the Cipro where there were medication interactions. Nonetheless I want to see if we can get him into infectious disease to see if they can work with him to try to find possibly add at home IV antibiotic regimen that can help out at this point. No sharp debridement is can be necessary today. 09/07/2020 upon evaluation today patient appears to be doing unfortunately not too well in regard to his wound. He has been tolerating the dressing changes without complication. Fortunately there is no sign of active infection at this time. No fevers, chills, nausea, vomiting, or diarrhea. The patient did have a repeat surgery going to clean out the abscess and try to work on getting things moving in a better direction about a week and a half ago. He still has sutures in place. With that being said unfortunately he does not appear to be showing signs of a lot of improvement in my opinion. He does have sutures on the plantar aspect still in place and on the dorsal aspect he has a small opening which actually probes all the way down through and actually came out of the plantar aspect. I feel like that he really is having a lot of issues here and I am afraid that if we  do not get this taken care of this is can end up with him having a much  larger and more extensive amputation than what he was hoping to have to deal with with this. It has already been since June 2021 but has been dealing with this including all the time since the amputation. 09/14/2020 upon evaluation today patient appears to be doing really about the same in regard to the wound on the dorsal surface of his foot. The plantar foot unfortunately still has sutures in place I really cannot see how things are doing in that regard. With the dorsal foot however this appears to show signs of still having a small opening in the distal aspect which does actually probe down to bone there is also significant purulent drainage we did actually obtain a wound culture today to further evaluate the issue here. 09/21/2020 upon evaluation today patient appears to be doing about the same in regard to his wound on the dorsal surface of his foot and the plantar foot he still does not have the sutures out at this point. That should hopefully happen in the next week. With that being said the main issue is he continues to have drainage here. It was noted that he had positive findings on culture for Enterobacter. With that being said he cannot take oral penicillin he could do vancomycin and I think that is good to be the ideal thing this to see if we can get him started on IV vancomycin at dialysis. We will get a get in touch with them to try to see if we can coordinate that being done there at the dialysis center. I think that is the ideal situation at this point I would probably start with just 2 weeks of therapy. 09/28/2020 upon evaluation today patient appears to be doing well with regard to his wounds in general. He also has back on the vancomycin after the culture results and this seems to be doing excellent for him. All the wounds are showing signs of good improvement. No fevers, chills, nausea, vomiting, or  diarrhea. 10/05/2020 upon evaluation today patient appears to actually be doing excellent in regard to his foot. Fortunately there is a lot of signs of new granulation epithelization at this point. There does not appear to be any evidence of active infection which is great news and overall very pleased with where things stand. In fact the open wound along the plantar aspect of his foot as well as the dorsal part of his foot has dramatically improved since we went forward with the vancomycin and requested that the dialysis center provide this to him by way of IV antibiotics with his dialysis. Overall I think that has made a dramatic improvement and overall his wound seems to be doing significantly better at this point. No fevers, chills, nausea, vomiting, or diarrhea. The patient was set to have his second opinion appointment with podiatry [facility] next week but to be perfectly honest I am not even certain that that is necessary at this point I do not think there is much that they would be able to do for him nor that he really would want them to do considering how well things look at this time. 10/12/2020 on evaluation today patient appears to be doing excellent in regard to his wounds. In fact the only issue I see is that everything is dried out as far as the collagen is concerned. I do think we can try to keep this moist he will continue to make excellent progress as he has been. Overall I am extremely pleased with what I see  today. 10/19/2020 upon evaluation today patient appears to be doing decently well in regard to the wounds on his foot he is can require some debridement in this location. Unfortunately is having some issues with blistering over the leg which is of greater concern at this point. Fortunately I do not see any signs of active infection which is great news. Nonetheless the blistering may just be related to venous insufficiency or subsequently could potentially be an issue with overall  worsening with regard to an infection but again it is not really hot to touch. 10/26/2020 upon evaluation today patient appears to be doing well with regard to the majority of his wounds. Unfortunately the dorsal surface of his foot does not appear to be doing quite as well today compared to what it has been doing. I do think that we will need to address that currently. Everything else is showing signs of improvement. He did not get the prescription that I gave him previous as unfortunately the transmission failed. I did not know that until today when the patient mention to me that he never got the antibiotic. 11/02/20 upon evaluation today patient appears to be doing much better in regard to his wounds in general. I feel like that he is making good progress and even the top of the foot does not appear to be doing too poorly at this time. He did finally get the antibiotics which is good. Unfortunately it does not look like I am able to electronically sent to his pharmacy it kept being an error in transmission every time this was sent. We finally decided to call it in. Nonetheless overall I think he is headed in the right direction based on what I am seeing. Ralph Peterson, Ralph Peterson ([id number]) Objective Constitutional Well-nourished and well-hydrated in no acute distress. Vitals Time Taken: 9:02 AM, Height: 72 in, Weight: 225 lbs, BMI: 30.5, Temperature: 98.4 F, Pulse: 80 bpm, Respiratory Rate: 16 breaths/min, Blood Pressure: 144/78 mmHg. General Notes: Reports A1C done 10/31/20 results 5.9 Respiratory normal breathing without difficulty. Psychiatric this patient is able to make decisions and demonstrates good insight into disease process. Alert and Oriented x 3. pleasant and cooperative. General Notes: Upon inspection patient's wound bed actually showed signs of good granulation epithelization at this point. There does not appear to be any signs of infection which is great news and overall very  pleased with where things stand today. I did perform debridement over the proximal lateral foot as well as the distal lateral foot and both areas post debridement appear to be doing much better which is great news. Integumentary (Hair, Skin) Wound #1 status is Open. Original cause of wound was Pressure Injury. The date acquired was: 02/20/2020. The wound has been in treatment 27 weeks. The wound is located on the Right,Distal,Lateral Foot. The wound measures 1.1cm length x 0.4cm width x 0.1cm depth; 0.346cm^2 area and 0.035cm^3 volume. There is Fat Layer (Subcutaneous Tissue) exposed. There is no tunneling or undermining noted. There is a none present amount of drainage noted. The wound margin is distinct with the outline attached to the wound base. There is small (1-33%) pink granulation within the wound bed. There is a large (67-100%) amount of necrotic tissue within the wound bed including Eschar. Wound #2R status is Open. Original cause of wound was Surgical Injury. The date acquired was: 04/20/2020. The wound has been in treatment 27 weeks. The wound is located on the Right,Distal,Dorsal Foot. The wound measures 0.3cm length x 0.2cm width x 0.8cm  depth; 0.047cm^2 area and 0.038cm^3 volume. There is Fat Layer (Subcutaneous Tissue) exposed. There is a medium amount of sanguinous drainage noted. The wound margin is distinct with the outline attached to the wound base. There is large (67-100%) red granulation within the wound bed. There is no necrotic tissue within the wound bed. Wound #8 status is Open. Original cause of wound was Gradually Appeared. The date acquired was: 09/07/2020. The wound has been in treatment 5 weeks. The wound is located on the Right,Proximal,Lateral Foot. The wound measures 0.4cm length x 0.2cm width x 0.1cm depth; 0.063cm^2 area and 0.006cm^3 volume. There is Fat Layer (Subcutaneous Tissue) exposed. There is no tunneling or undermining noted. There is a none present amount of  drainage noted. The wound margin is flat and intact. There is no granulation within the wound bed. There is a large (67-100%) amount of necrotic tissue within the wound bed including Eschar. Wound #9 status is Open. Original cause of wound was Blister. The date acquired was: 10/16/2020. The wound has been in treatment 2 weeks. The wound is located on the Right,Medial Lower Leg. The wound measures 9cm length x 4cm width x 0.1cm depth; 28.274cm^2 area and 2.827cm^3 volume. There is Fat Layer (Subcutaneous Tissue) exposed. There is no tunneling or undermining noted. There is a medium amount of serous drainage noted. There is large (67-100%) red, pink granulation within the wound bed. There is a small (1-33%) amount of necrotic tissue within the wound bed including Eschar and Adherent Slough. 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 ([id number]) Procedures Wound #1 Pre-procedure diagnosis of Wound #1 is a Pressure Ulcer located on the Right,Distal,Lateral Foot . There was a Selective/Open Wound Skin/Epidermis Debridement with a total area of 0.44 sq cm performed by Tommie Sams., PA-C. With the following instrument(s): Curette to remove Viable and Non-Viable tissue/material. Material removed includes Callus, Slough, and Skin: Epidermis after achieving pain control using Lidocaine 4% Topical Solution. A time out was conducted at 09:57, prior to the start of the procedure. A Minimum amount of bleeding was controlled with  Pressure. The procedure was tolerated well. Post Debridement Measurements: 1.1cm length x 0.4cm width x 0.1cm depth; 0.035cm^3 volume. Post debridement Stage noted as Category/Stage IV. Character of Wound/Ulcer Post Debridement is stable. Post procedure Diagnosis Wound #1: Same as Pre-Procedure Wound #8 Pre-procedure diagnosis of Wound #8 is a Pressure Ulcer located on the Right,Proximal,Lateral Foot . There was a Excisional Skin/Subcutaneous Tissue Debridement with a total area of 0.08 sq cm performed by Tommie Sams., PA-C. With the following instrument(s): Curette to remove Viable and Non-Viable tissue/material. Material removed includes Subcutaneous Tissue, Slough, and Skin: Epidermis after achieving pain control using Lidocaine 4% Topical Solution. A time out was conducted at 09:57, prior to the start of the procedure. A Minimum amount of bleeding was controlled with Pressure. The procedure was tolerated well. Post Debridement Measurements: 0.4cm length x 0.3cm width x 0.2cm depth; 0.019cm^3 volume. Post debridement Stage noted as Category/Stage III. Character of Wound/Ulcer Post Debridement is stable. Post procedure Diagnosis Wound #8: Same as Pre-Procedure Plan Follow-up Appointments: Return  Appointment in 1 week. Bathing/ Shower/ Hygiene: Clean wound with Normal Saline or wound cleanser. Off-Loading: Open toe surgical shoe with peg assist. - Right foot Other: - Betadine on heel, wear socks for protection WOUND #1: - Foot Wound Laterality: Right, Lateral, Distal Cleanser: Normal Saline 3 x Per Week/30 Days Discharge Instructions: Wash your hands with soap and water. Remove old dressing, discard into plastic bag and place into trash. Cleanse the wound with Normal Saline prior to applying a clean dressing using gauze sponges, not tissues or cotton balls. Do not scrub or use excessive force. Pat dry using gauze sponges, not tissue or cotton balls. Primary Dressing: Curad Oil Emulsion  Dressing 3x3 (in/in) 3 x Per Week/30 Days Discharge Instructions: Apply on wound bed Secondary Dressing: ABD Pad 5x9 (in/in) 3 x Per Week/30 Days Discharge Instructions: Cover with ABD pad Secured With: 47M Medipore H Soft Cloth Surgical Tape, 2x2 (in/yd) 3 x Per Week/30 Days Discharge Instructions: Secure Kerlix Secured With: [employer] Sterile or Non-Sterile 6-ply 4.5x4 (yd/yd) 3 x Per Week/30 Days Discharge Instructions: Apply Kerlix as directed Secured With: Tubigrip Size E, 3.5x10 (in/yds) 3 x Per Week/30 Days Discharge Instructions: Apply 3 Tubigrip E 3-finger-widths below knee to base of toes to secure dressing and/or for swelling. WOUND #2R: - Foot Wound Laterality: Dorsal, Right, Distal Cleanser: Normal Saline 3 x Per Week/30 Days Discharge Instructions: Wash your hands with soap and water. Remove old dressing, discard into plastic bag and place into trash. Cleanse the wound with Normal Saline prior to applying a clean dressing using gauze sponges, not tissues or cotton balls. Do not scrub or use excessive force. Pat dry using gauze sponges, not tissue or cotton balls. Primary Dressing: Hydrofera Blue Classic Foam Rope Dressing, 9x6 (mm/in) 3 x Per Week/30 Days Discharge Instructions: Cut rope in half, 2 cm in length-pack in wound, moisten with a couple drops of saline after inserted (extras sent with patient) Secondary Dressing: ABD Pad 5x9 (in/in) 3 x Per Week/30 Days Discharge Instructions: Cover with ABD pad Secured With: 47M Medipore H Soft Cloth Surgical Tape, 2x2 (in/yd) 3 x Per Week/30 Days Discharge Instructions: Secure Kerlix Secured With: [employer] Sterile or Non-Sterile 6-ply 4.5x4 (yd/yd) 3 x Per Week/30 Days Discharge Instructions: Apply Kerlix as directed Secured With: Tubigrip Size E, 3.5x10 (in/yds) 3 x Per Week/30 Days Discharge Instructions: Apply 3 Tubigrip E 3-finger-widths below knee to base of toes to secure dressing and/or for swelling. WOUND #8: -  Foot Wound Laterality: Right, Lateral, Proximal Cleanser: Normal Saline 3 x Per Week/30 Days Discharge Instructions: Wash your hands with soap and water. Remove old dressing, discard into plastic bag and place into trash. Cleanse the wound with Normal Saline prior to applying a clean dressing using gauze sponges, not tissues or cotton balls. Do not scrub or use excessive force. Pat dry using gauze sponges, not tissue or cotton balls. Primary Dressing: Curad Oil Emulsion Dressing 3x3 (in/in) 3 x Per Week/30 Days Discharge Instructions: Apply on wound bed Secondary Dressing: ABD Pad 5x9 (in/in) 3 x Per Week/30 Days Discharge Instructions: Cover with ABD pad Ralph Peterson, Ralph Peterson ([id number]) Secured With: 47M Medipore H Soft Cloth Surgical Tape, 2x2 (in/yd) 3 x Per Week/30 Days Discharge Instructions: Secure Kerlix Secured With: [employer] Sterile or Non-Sterile 6-ply 4.5x4 (yd/yd) 3 x Per Week/30 Days Discharge Instructions: Apply Kerlix as directed Secured With: Tubigrip Size E, 3.5x10 (in/yds) 3 x Per Week/30 Days Discharge Instructions: Apply 3 Tubigrip E 3-finger-widths below knee to base  of toes to secure dressing and/or for swelling. WOUND #9: - Lower Leg Wound Laterality: Right, Medial Cleanser: Normal Saline 3 x Per Week/30 Days Discharge Instructions: Wash your hands with soap and water. Remove old dressing, discard into plastic bag and place into trash. Cleanse the wound with Normal Saline prior to applying a clean dressing using gauze sponges, not tissues or cotton balls. Do not scrub or use excessive force. Pat dry using gauze sponges, not tissue or cotton balls. Primary Dressing: Curad Oil Emulsion Dressing 3x3 (in/in) 3 x Per Week/30 Days Discharge Instructions: Apply on wound bed Secondary Dressing: ABD Pad 5x9 (in/in) 3 x Per Week/30 Days Discharge Instructions: Cover with ABD pad Secured With: 77M Medipore H Soft Cloth Surgical Tape, 2x2 (in/yd) 3 x Per Week/30  Days Discharge Instructions: Secure Kerlix Secured With: [employer] Sterile or Non-Sterile 6-ply 4.5x4 (yd/yd) 3 x Per Week/30 Days Discharge Instructions: Apply Kerlix as directed Secured With: Tubigrip Size E, 3.5x10 (in/yds) 3 x Per Week/30 Days Discharge Instructions: Apply 3 Tubigrip E 3-finger-widths below knee to base of toes to secure dressing and/or for swelling. 1. Would recommend currently that we going to continue with the wound care measures as before and the patient is in agreement the plan this includes the use of the Betadine to the heel. I think that is doing well. 2. With regard to the dorsal foot distally I am good recommend we continue with the Hydrofera Blue rope. 3. With regard to the other wounds were getting use just the oil emulsion dressing over all of these areas just to allow for appropriate healing I think that the collagen is just getting to stock and to be honest I am concerned that the patient may need to just allow this to heal along with a solid without the collagen in place. Other than that I think he is doing quite well. We will see patient back for reevaluation in 1 week here in the clinic. If anything worsens or changes patient will contact our office for additional recommendations. Electronic Signature(s) Signed: 11/02/2020 10:34:36 AM By: Worthy Keeler PA-C Entered By: Worthy Keeler on 11/02/2020 10:34:36 Ralph Peterson ([id number]) -------------------------------------------------------------------------------- SuperBill Details Patient Name: Ralph Peterson Date of Service: 11/02/2020 Medical Record Number: [id number] Patient Account Number: [id number] Date of Birth/Sex: 02/20/1966 (55 y.o. M) Treating RN: Dolan Amen Primary Care Provider: Vidal Schwalbe Other Clinician: Referring Provider: Vidal Schwalbe Treating Provider/Extender: Skipper Cliche in Treatment: 27 Diagnosis Coding ICD-10 Codes Code Description [phone]  Pressure ulcer of other site, stage 3 T81.31XA Disruption of external operation (surgical) wound, not elsewhere classified, initial encounter 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d L97.812 Non-pressure chronic ulcer of other part of right lower leg with fat layer exposed E11.621 Type 2 diabetes mellitus with foot ulcer I50.42 Chronic combined systolic (congestive) and diastolic (congestive) heart failure N18.6 End stage renal disease Z99.2 Dependence on renal dialysis Facility Procedures CPT4 Code: [id number] Description: 11042 - DEB SUBQ TISSUE 20 SQ CM/< Modifier: Quantity: 1 CPT4 Code: Description: ICD-10 Diagnosis Description 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 Modifier: sed Quantity: CPT4 Code: [id number] Description: 97597 - DEBRIDE WOUND 1ST 20 SQ CM OR < Modifier: Quantity: 1 CPT4 Code: Description: ICD-10 Diagnosis Description 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 Modifier: sed Quantity: Physician Procedures CPT4 Code: [id number] Description: 11042 - WC PHYS SUBQ TISS 20  SQ CM Modifier: Quantity: 1 CPT4 Code: Description: ICD-10 Diagnosis Description 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 Modifier: sed Quantity: CPT4 Code: [id number] Description: [id number] - WC PHYS DEBR WO ANESTH 20 SQ CM Modifier: Quantity: 1 CPT4 Code: Description: ICD-10 Diagnosis Description [id number] Non-pressure chronic ulcer of other part of right foot with fat layer expo Modifier: sed Quantity: Electronic Signature(s) Signed: 11/02/2020 10:34:51 AM By: Worthy Keeler PA-C Entered By: Worthy Keeler on 11/02/2020 10:34:51

## 2020-11-03 NOTE — Progress Notes (Signed)
CASPAR, [email or web address] ([id number]) Visit Report for 11/02/2020 Arrival Information Details Patient Name: Ralph Peterson, Ralph Peterson. Date of Service: 11/02/2020 9:00 AM Medical Record Number: [id number] Patient Account Number: [id number] Date of Birth/Sex: 1965/08/25 (55 y.o. M) Treating RN: Donnamarie Poag Primary Care Austan Nicholl: Vidal Schwalbe Other Clinician: Referring Pradeep Beaubrun: Vidal Schwalbe Treating Cieanna Stormes/Extender: Skipper Cliche in Treatment: 27 Visit Information History Since Last Visit Added or deleted any medications: No Patient Arrived: Wheel Chair Had a fall or experienced change in No Arrival Time: 09:00 activities of daily living that may affect Accompanied By: son risk of falls: Transfer Assistance: None Hospitalized since last visit: No Patient Identification Verified: Yes Has Dressing in Place as Prescribed: Yes Secondary Verification Process Completed: Yes Pain Present Now: No Patient Requires Transmission-Based Precautions: No Patient Has Alerts: No Electronic Signature(s) Signed: 11/02/2020 5:20:48 PM By: Donnamarie Poag Entered By: Donnamarie Poag on 11/02/2020 09:03:40 Ralph Peterson ([id number]) -------------------------------------------------------------------------------- Clinic Level of Care Assessment Details Patient Name: Ralph Peterson Date of Service: 11/02/2020 9:00 AM Medical Record Number: [id number] Patient Account Number: [id number] Date of Birth/Sex: [birth year] (55 y.o. M) Treating RN: Dolan Amen Primary Care Mac Dowdell: Vidal Schwalbe Other Clinician: Referring Tyrease Vandeberg: Vidal Schwalbe Treating Dvon Jiles/Extender: Skipper Cliche in Treatment: 27 Clinic Level of Care Assessment Items TOOL 1 Quantity Score '[]'$  - Use when EandM and Procedure is performed on INITIAL visit 0 ASSESSMENTS - Nursing Assessment / Reassessment '[]'$  - General Physical Exam (combine w/ comprehensive assessment (listed just below) when performed on new 0 pt.  evals) '[]'$  - 0 Comprehensive Assessment (HX, ROS, Risk Assessments, Wounds Hx, etc.) ASSESSMENTS - Wound and Skin Assessment / Reassessment '[]'$  - Dermatologic / Skin Assessment (not related to wound area) 0 ASSESSMENTS - Ostomy and/or Continence Assessment and Care '[]'$  - Incontinence Assessment and Management 0 '[]'$  - 0 Ostomy Care Assessment and Management (repouching, etc.) PROCESS - Coordination of Care '[]'$  - Simple Patient / Family Education for ongoing care 0 '[]'$  - 0 Complex (extensive) Patient / Family Education for ongoing care '[]'$  - 0 Staff obtains Programmer, systems, Records, Test Results / Process Orders '[]'$  - 0 Staff telephones HHA, Nursing Homes / Clarify orders / etc '[]'$  - 0 Routine Transfer to another Facility (non-emergent condition) '[]'$  - 0 Routine Hospital Admission (non-emergent condition) '[]'$  - 0 New Admissions / Biomedical engineer / Ordering NPWT, Apligraf, etc. '[]'$  - 0 Emergency Hospital Admission (emergent condition) PROCESS - Special Needs '[]'$  - Pediatric / Minor Patient Management 0 '[]'$  - 0 Isolation Patient Management '[]'$  - 0 Hearing / Language / Visual special needs '[]'$  - 0 Assessment of Community assistance (transportation, D/C planning, etc.) '[]'$  - 0 Additional assistance / Altered mentation '[]'$  - 0 Support Surface(s) Assessment (bed, cushion, seat, etc.) INTERVENTIONS - Miscellaneous '[]'$  - External ear exam 0 '[]'$  - 0 Patient Transfer (multiple staff / Civil Service fast streamer / Similar devices) '[]'$  - 0 Simple Staple / Suture removal (25 or less) '[]'$  - 0 Complex Staple / Suture removal (26 or more) '[]'$  - 0 Hypo/Hyperglycemic Management (do not check if billed separately) '[]'$  - 0 Ankle / Brachial Index (ABI) - do not check if billed separately Has the patient been seen at the hospital within the last three years: Yes Total Score: 0 Level Of Care: ____ Ralph Peterson ([id number]) Electronic Signature(s) Signed: 11/02/2020 5:28:26 PM By: Georges Mouse, Minus Breeding RN Entered  By: Georges Mouse, Minus Breeding on 11/02/2020 10:07:39 Ralph Peterson ([id number]) -------------------------------------------------------------------------------- Encounter Discharge Information Details Patient Name: Ralph Peterson. Date of Service:  11/02/2020 9:00 AM Medical Record Number: [id number] Patient Account Number: [id number] Date of Birth/Sex: [birth year] (55 y.o. M) Treating RN: Donnamarie Poag Primary Care Dayton Sherr: Vidal Schwalbe Other Clinician: Referring Gavina Dildine: Vidal Schwalbe Treating Skyra Crichlow/Extender: Skipper Cliche in Treatment: 27 Encounter Discharge Information Items Post Procedure Vitals Discharge Condition: Stable Temperature (F): 97.8 Ambulatory Status: Wheelchair Pulse (bpm): 71 Discharge Destination: Home Respiratory Rate (breaths/min): 16 Transportation: Private Auto Blood Pressure (mmHg): 144/78 Accompanied By: son Schedule Follow-up Appointment: Yes Clinical Summary of Care: Electronic Signature(s) Signed: 11/02/2020 5:20:48 PM By: Donnamarie Poag Entered By: Donnamarie Poag on 11/02/2020 10:22:56 Ralph Peterson ([id number]) -------------------------------------------------------------------------------- Lower Extremity Assessment Details Patient Name: Ralph Peterson. Date of Service: 11/02/2020 9:00 AM Medical Record Number: [id number] Patient Account Number: [id number] Date of Birth/Sex: February 22, 1966 (55 y.o. M) Treating RN: Donnamarie Poag Primary Care Macyn Shropshire: Vidal Schwalbe Other Clinician: Referring Tyaisha Cullom: Vidal Schwalbe Treating Tayleigh Wetherell/Extender: Skipper Cliche in Treatment: 27 Edema Assessment Assessed: [Left: No] [Right: Yes] [Left: Edema] [Right: :] Calf Left: Right: Point of Measurement: 38 cm From Medial Instep 31 cm Ankle Left: Right: Point of Measurement: 10 cm From Medial Instep 21 cm Vascular Assessment Pulses: Dorsalis Pedis Palpable: [Right:Yes] Electronic Signature(s) Signed: 11/02/2020 5:20:48 PM By:  Donnamarie Poag Entered By: Donnamarie Poag on 11/02/2020 09:20:15 Ralph Peterson ([id number]) -------------------------------------------------------------------------------- Multi Wound Chart Details Patient Name: Ralph Peterson. Date of Service: 11/02/2020 9:00 AM Medical Record Number: [id number] Patient Account Number: [id number] Date of Birth/Sex: 08-15-1965 (55 y.o. M) Treating RN: Dolan Amen Primary Care Malita Ignasiak: Vidal Schwalbe Other Clinician: Referring Mccade Sullenberger: Vidal Schwalbe Treating Raynor Calcaterra/Extender: Skipper Cliche in Treatment: 27 Vital Signs Height(in): 72 Pulse(bpm): 80 Weight(lbs): 225 Blood Pressure(mmHg): 144/78 Body Mass Index(BMI): 31 Temperature(F): 98.4 Respiratory Rate(breaths/min): 16 Photos: Wound Location: Right, Distal, Lateral Foot Right, Distal, Dorsal Foot Right, Proximal, Lateral Foot Wounding Event: Pressure Injury Surgical Injury Gradually Appeared Primary Etiology: Pressure Ulcer Open Surgical Wound Pressure Ulcer Comorbid History: Arrhythmia, Congestive Heart Arrhythmia, Congestive Heart Arrhythmia, Congestive Heart Failure, Coronary Artery Disease, Failure, Coronary Artery Disease, Failure, Coronary Artery Disease, Hypertension, Myocardial Infarction, Hypertension, Myocardial Infarction, Hypertension, Myocardial Infarction, Peripheral Venous Disease, Type II Peripheral Venous Disease, Type II Peripheral Venous Disease, Type II Diabetes, Neuropathy Diabetes, Neuropathy Diabetes, Neuropathy Date Acquired: 02/20/2020 04/20/2020 09/07/2020 Weeks of Treatment: '27 27 5 '$ Wound Status: Open Open Open Wound Recurrence: No Yes No Measurements L x W x D (cm) 1.1x0.4x0.1 0.3x0.2x0.8 0.4x0.2x0.1 Area (cm) : 0.346 0.047 0.063 Volume (cm) : 0.035 0.038 0.006 % Reduction in Area: 93.20% 99.00% 50.00% % Reduction in Volume: 98.60% 97.30% 53.80% Classification: Category/Stage IV Full Thickness Without Exposed Unstageable/Unclassified Support  Structures Exudate Amount: None Present Medium None Present Exudate Type: N/A Sanguinous N/A Exudate Color: N/A red N/A Wound Margin: Distinct, outline attached Distinct, outline attached Flat and Intact Granulation Amount: Small (1-33%) Large (67-100%) None Present (0%) Granulation Quality: Pink Red N/A Necrotic Amount: Large (67-100%) None Present (0%) Large (67-100%) Necrotic Tissue: Eschar N/A Eschar Exposed Structures: Fat Layer (Subcutaneous Tissue): Fat Layer (Subcutaneous Tissue): Fat Layer (Subcutaneous Tissue): Yes Yes Yes Fascia: No Fascia: No Fascia: No Tendon: No Tendon: No Tendon: No Muscle: No Muscle: No Muscle: No Joint: No Joint: No Joint: No Bone: No Bone: No Bone: No Epithelialization: None Medium (34-66%) N/A Wound Number: 9 N/A N/A Photos: N/A N/A Ralph Peterson, Ralph Peterson ([id number]) Wound Location: Right, Medial Lower Leg N/A N/A Wounding Event: Blister N/A N/A Primary Etiology: Diabetic Wound/Ulcer of the Lower N/A N/A Extremity Comorbid History: Arrhythmia, Congestive Heart N/A  N/A Failure, Coronary Artery Disease, Hypertension, Myocardial Infarction, Peripheral Venous Disease, Type II Diabetes, Neuropathy Date Acquired: 10/16/2020 N/A N/A Weeks of Treatment: 2 N/A N/A Wound Status: Open N/A N/A Wound Recurrence: No N/A N/A Measurements L x W x D (cm) 9x4x0.1 N/A N/A Area (cm) : 28.274 N/A N/A Volume (cm) : 2.827 N/A N/A % Reduction in Area: 35.10% N/A N/A % Reduction in Volume: 35.10% N/A N/A Classification: Grade 1 N/A N/A Exudate Amount: Medium N/A N/A Exudate Type: Serous N/A N/A Exudate Color: amber N/A N/A Wound Margin: N/A N/A N/A Granulation Amount: Large (67-100%) N/A N/A Granulation Quality: Red, Pink N/A N/A Necrotic Amount: Small (1-33%) N/A N/A Necrotic Tissue: Eschar, Adherent Slough N/A N/A Exposed Structures: Fat Layer (Subcutaneous Tissue): N/A N/A Yes Fascia: No Tendon: No Muscle: No Joint: No Bone:  No Epithelialization: Small (1-33%) N/A N/A Treatment Notes Electronic Signature(s) Signed: 11/02/2020 5:28:26 PM By: Georges Mouse, Minus Breeding RN Entered By: Georges Mouse, Kenia on 11/02/2020 09:56:13 Ralph Peterson ([id number]) -------------------------------------------------------------------------------- Multi-Disciplinary Care Plan Details Patient Name: Ralph Peterson Date of Service: 11/02/2020 9:00 AM Medical Record Number: [id number] Patient Account Number: [id number] Date of Birth/Sex: 02/08/1966 (55 y.o. M) Treating RN: Dolan Amen Primary Care Danne Scardina: Vidal Schwalbe Other Clinician: Referring Eann Cleland: Vidal Schwalbe Treating Jacqeline Broers/Extender: Skipper Cliche in Treatment: 27 Active Inactive Necrotic Tissue Nursing Diagnoses: Impaired tissue integrity related to necrotic/devitalized tissue Knowledge deficit related to management of necrotic/devitalized tissue Goals: Necrotic/devitalized tissue will be minimized in the wound bed Date Initiated: 05/09/2020 Target Resolution Date: 05/09/2020 Goal Status: Active Interventions: Assess patient pain level pre-, during and post procedure and prior to discharge Provide education on necrotic tissue and debridement process Treatment Activities: Enzymatic debridement : 05/09/2020 Notes: Electronic Signature(s) Signed: 11/02/2020 5:28:26 PM By: Georges Mouse, Minus Breeding RN Entered By: Georges Mouse, Minus Breeding on 11/02/2020 09:55:55 Ralph Peterson ([id number]) -------------------------------------------------------------------------------- Pain Assessment Details Patient Name: Ralph Peterson. Date of Service: 11/02/2020 9:00 AM Medical Record Number: [id number] Patient Account Number: [id number] Date of Birth/Sex: 03-Apr-1966 (55 y.o. M) Treating RN: Donnamarie Poag Primary Care Davinia Riccardi: Vidal Schwalbe Other Clinician: Referring Adalis Gatti: Vidal Schwalbe Treating Leandrew Keech/Extender: Skipper Cliche in  Treatment: 27 Active Problems Location of Pain Severity and Description of Pain Patient Has Paino No Site Locations Rate the pain. Current Pain Level: 0 Pain Management and Medication Current Pain Management: Electronic Signature(s) Signed: 11/02/2020 5:20:48 PM By: Donnamarie Poag Entered By: Donnamarie Poag on 11/02/2020 09:06:57 Ralph Peterson ([id number]) -------------------------------------------------------------------------------- Patient/Caregiver Education Details Patient Name: Ralph Peterson Date of Service: 11/02/2020 9:00 AM Medical Record Number: [id number] Patient Account Number: [id number] Date of Birth/Gender: June 14, 1966 (55 y.o. M) Treating RN: Dolan Amen Primary Care Physician: Vidal Schwalbe Other Clinician: Referring Physician: Vidal Schwalbe Treating Physician/Extender: Skipper Cliche in Treatment: 87 Education Assessment Education Provided To: Patient Education Topics Provided Wound/Skin Impairment: Methods: Explain/Verbal Responses: State content correctly Electronic Signature(s) Signed: 11/02/2020 5:28:26 PM By: Georges Mouse, Minus Breeding RN Entered By: Georges Mouse, Minus Breeding on 11/02/2020 10:08:13 Ralph Peterson ([id number]) -------------------------------------------------------------------------------- Wound Assessment Details Patient Name: Ralph Peterson Date of Service: 11/02/2020 9:00 AM Medical Record Number: [id number] Patient Account Number: [id number] Date of Birth/Sex: [birth year] (55 y.o. M) Treating RN: Donnamarie Poag Primary Care Jaleena Viviani: Vidal Schwalbe Other Clinician: Referring Lorianne Malbrough: Vidal Schwalbe Treating Rebeckah Masih/Extender: Skipper Cliche in Treatment: 27 Wound Status Wound Number: 1 Primary Pressure Ulcer Etiology: Wound Location: Right, Distal, Lateral Foot Wound Open Wounding Event: Pressure Injury Status: Date Acquired: 02/20/2020 Comorbid Arrhythmia, Congestive Heart Failure, Coronary  Artery Weeks Of Treatment: 27 History: Disease, Hypertension, Myocardial Infarction, Peripheral Clustered Wound: No Venous Disease, Type II Diabetes, Neuropathy Photos Wound Measurements Length: (cm) 1.1 Width: (cm) 0.4 Depth: (cm) 0.1 Area: (cm) 0.346 Volume: (cm) 0.035 % Reduction in Area: 93.2% % Reduction in Volume: 98.6% Epithelialization: None Tunneling: No Undermining: No Wound Description Classification: Category/Stage IV Wound Margin: Distinct, outline attached Exudate Amount: None Present Foul Odor After Cleansing: No Slough/Fibrino Yes Wound Bed Granulation Amount: Small (1-33%) Exposed Structure Granulation Quality: Pink Fascia Exposed: No Necrotic Amount: Large (67-100%) Fat Layer (Subcutaneous Tissue) Exposed: Yes Necrotic Quality: Eschar Tendon Exposed: No Muscle Exposed: No Joint Exposed: No Bone Exposed: No Treatment Notes Wound #1 (Foot) Wound Laterality: Right, Lateral, Distal Cleanser Normal Saline Discharge Instruction: Wash your hands with soap and water. Remove old dressing, discard into plastic bag and place into trash. Cleanse the wound with Normal Saline prior to applying a clean dressing using gauze sponges, not tissues or cotton balls. Do not scrub or use excessive force. Pat dry using gauze sponges, not tissue or cotton balls. Peri-Wound Care Ralph Peterson, Ralph Peterson ([id number]) Topical Primary Dressing Curad Oil Emulsion Dressing 3x3 (in/in) Discharge Instruction: Apply on wound bed Secondary Dressing ABD Pad 5x9 (in/in) Discharge Instruction: Cover with ABD pad Secured With 80M Medipore H Soft Cloth Surgical Tape, 2x2 (in/yd) Discharge Instruction: Secure Kerlix Kerlix Roll Sterile or Non-Sterile 6-ply 4.5x4 (yd/yd) Discharge Instruction: Apply Kerlix as directed Tubigrip Size E, 3.5x10 (in/yds) Discharge Instruction: Apply 3 Tubigrip E 3-finger-widths below knee to base of toes to secure dressing and/or for swelling. Compression  Wrap Compression Stockings Add-Ons Electronic Signature(s) Signed: 11/02/2020 5:20:48 PM By: Donnamarie Poag Entered By: Donnamarie Poag on 11/02/2020 09:14:21 Ralph Peterson ([id number]) -------------------------------------------------------------------------------- Wound Assessment Details Patient Name: Ralph Peterson. Date of Service: 11/02/2020 9:00 AM Medical Record Number: [id number] Patient Account Number: [id number] Date of Birth/Sex: 1966-07-04 (55 y.o. M) Treating RN: Donnamarie Poag Primary Care Aden Youngman: Vidal Schwalbe Other Clinician: Referring Nurah Petrides: Vidal Schwalbe Treating Bode Pieper/Extender: Skipper Cliche in Treatment: 27 Wound Status Wound Number: 2R Primary Open Surgical Wound Etiology: Wound Location: Right, Distal, Dorsal Foot Wound Open Wounding Event: Surgical Injury Status: Date Acquired: 04/20/2020 Comorbid Arrhythmia, Congestive Heart Failure, Coronary Artery Weeks Of Treatment: 27 History: Disease, Hypertension, Myocardial Infarction, Peripheral Clustered Wound: No Venous Disease, Type II Diabetes, Neuropathy Photos Wound Measurements Length: (cm) 0.3 Width: (cm) 0.2 Depth: (cm) 0.8 Area: (cm) 0.047 Volume: (cm) 0.038 % Reduction in Area: 99% % Reduction in Volume: 97.3% Epithelialization: Medium (34-66%) Wound Description Classification: Full Thickness Without Exposed Support Structures Wound Margin: Distinct, outline attached Exudate Amount: Medium Exudate Type: Sanguinous Exudate Color: red Foul Odor After Cleansing: No Slough/Fibrino No Wound Bed Granulation Amount: Large (67-100%) Exposed Structure Granulation Quality: Red Fascia Exposed: No Necrotic Amount: None Present (0%) Fat Layer (Subcutaneous Tissue) Exposed: Yes Tendon Exposed: No Muscle Exposed: No Joint Exposed: No Bone Exposed: No Treatment Notes Wound #2R (Foot) Wound Laterality: Dorsal, Right, Distal Cleanser Normal Saline Discharge Instruction: Wash your  hands with soap and water. Remove old dressing, discard into plastic bag and place into trash. Cleanse the wound with Normal Saline prior to applying a clean dressing using gauze sponges, not tissues or cotton balls. Do not Ralph Peterson, Ralph S. ([id number]) scrub or use excessive force. Pat dry using gauze sponges, not tissue or cotton balls. Peri-Wound Care Topical Primary Dressing Hydrofera Blue Classic Foam Rope Dressing, 9x6 (mm/in) Discharge Instruction: Cut rope in half, 2 cm in length-pack in wound, moisten  with a couple drops of saline after inserted (extras sent with patient) Secondary Dressing ABD Pad 5x9 (in/in) Discharge Instruction: Cover with ABD pad Secured With 68M [place] Surgical Tape, 2x2 (in/yd) Discharge Instruction: Secure Kerlix Kerlix Roll Sterile or Non-Sterile 6-ply 4.5x4 (yd/yd) Discharge Instruction: Apply Kerlix as directed Tubigrip Size E, 3.5x10 (in/yds) Discharge Instruction: Apply 3 Tubigrip E 3-finger-widths below knee to base of toes to secure dressing and/or for swelling. Compression Wrap Compression Stockings Add-Ons Electronic Signature(s) Signed: 11/02/2020 5:20:48 PM By: Donnamarie Poag Entered By: Donnamarie Poag on 11/02/2020 09:15:53 Ralph Peterson ([id number]) -------------------------------------------------------------------------------- Wound Assessment Details Patient Name: Ralph Peterson. Date of Service: 11/02/2020 9:00 AM Medical Record Number: [id number] Patient Account Number: [id number] Date of Birth/Sex: 09/10/1965 (55 y.o. M) Treating RN: Donnamarie Poag Primary Care Bellamy Judson: Vidal Schwalbe Other Clinician: Referring Britten Parady: Vidal Schwalbe Treating Brettney Ficken/Extender: Skipper Cliche in Treatment: 27 Wound Status Wound Number: 8 Primary Pressure Ulcer Etiology: Wound Location: Right, Proximal, Lateral Foot Wound Open Wounding Event: Gradually Appeared Status: Date Acquired: 09/07/2020 Comorbid  Arrhythmia, Congestive Heart Failure, Coronary Artery Weeks Of Treatment: 5 History: Disease, Hypertension, Myocardial Infarction, Peripheral Clustered Wound: No Venous Disease, Type II Diabetes, Neuropathy Photos Wound Measurements Length: (cm) 0.4 Width: (cm) 0.2 Depth: (cm) 0.1 Area: (cm) 0.063 Volume: (cm) 0.006 % Reduction in Area: 50% % Reduction in Volume: 53.8% Tunneling: No Undermining: No Wound Description Classification: Unstageable/Unclassified Wound Margin: Flat and Intact Exudate Amount: None Present Foul Odor After Cleansing: No Slough/Fibrino Yes Wound Bed Granulation Amount: None Present (0%) Exposed Structure Necrotic Amount: Large (67-100%) Fascia Exposed: No Necrotic Quality: Eschar Fat Layer (Subcutaneous Tissue) Exposed: Yes Tendon Exposed: No Muscle Exposed: No Joint Exposed: No Bone Exposed: No Treatment Notes Wound #8 (Foot) Wound Laterality: Right, Lateral, Proximal Cleanser Normal Saline Discharge Instruction: Wash your hands with soap and water. Remove old dressing, discard into plastic bag and place into trash. Cleanse the wound with Normal Saline prior to applying a clean dressing using gauze sponges, not tissues or cotton balls. Do not scrub or use excessive force. Pat dry using gauze sponges, not tissue or cotton balls. Peri-Wound Care Ralph Peterson, Ralph Peterson ([id number]) Topical Primary Dressing Curad Oil Emulsion Dressing 3x3 (in/in) Discharge Instruction: Apply on wound bed Secondary Dressing ABD Pad 5x9 (in/in) Discharge Instruction: Cover with ABD pad Secured With 68M Medipore H Soft Cloth Surgical Tape, 2x2 (in/yd) Discharge Instruction: Secure Kerlix Kerlix Roll Sterile or Non-Sterile 6-ply 4.5x4 (yd/yd) Discharge Instruction: Apply Kerlix as directed Tubigrip Size E, 3.5x10 (in/yds) Discharge Instruction: Apply 3 Tubigrip E 3-finger-widths below knee to base of toes to secure dressing and/or for swelling. Compression  Wrap Compression Stockings Add-Ons Electronic Signature(s) Signed: 11/02/2020 5:20:48 PM By: Donnamarie Poag Entered By: Donnamarie Poag on 11/02/2020 09:17:06 Ralph Peterson ([id number]) -------------------------------------------------------------------------------- Wound Assessment Details Patient Name: Ralph Peterson. Date of Service: 11/02/2020 9:00 AM Medical Record Number: [id number] Patient Account Number: [id number] Date of Birth/Sex: 03/20/1966 (55 y.o. M) Treating RN: Donnamarie Poag Primary Care Trinia Georgi: Vidal Schwalbe Other Clinician: Referring Kaislee Chao: Vidal Schwalbe Treating Daran Favaro/Extender: Skipper Cliche in Treatment: 27 Wound Status Wound Number: 9 Primary Diabetic Wound/Ulcer of the Lower Extremity Etiology: Wound Location: Right, Medial Lower Leg Wound Open Wounding Event: Blister Status: Date Acquired: 10/16/2020 Comorbid Arrhythmia, Congestive Heart Failure, Coronary Artery Weeks Of Treatment: 2 History: Disease, Hypertension, Myocardial Infarction, Peripheral Clustered Wound: No Venous Disease, Type II Diabetes, Neuropathy Photos Wound Measurements Length: (cm) 9 Width: (cm) 4 Depth: (cm) 0.1 Area: (cm) 28.274  Volume: (cm) 2.827 % Reduction in Area: 35.1% % Reduction in Volume: 35.1% Epithelialization: Small (1-33%) Tunneling: No Undermining: No Wound Description Classification: Grade 1 Exudate Amount: Medium Exudate Type: Serous Exudate Color: amber Foul Odor After Cleansing: No Slough/Fibrino Yes Wound Bed Granulation Amount: Large (67-100%) Exposed Structure Granulation Quality: Red, Pink Fascia Exposed: No Necrotic Amount: Small (1-33%) Fat Layer (Subcutaneous Tissue) Exposed: Yes Necrotic Quality: Eschar, Adherent Slough Tendon Exposed: No Muscle Exposed: No Joint Exposed: No Bone Exposed: No Treatment Notes Wound #9 (Lower Leg) Wound Laterality: Right, Medial Cleanser Normal Saline Discharge Instruction: Wash your  hands with soap and water. Remove old dressing, discard into plastic bag and place into trash. Cleanse the wound with Normal Saline prior to applying a clean dressing using gauze sponges, not tissues or cotton balls. Do not scrub or use excessive force. Pat dry using gauze sponges, not tissue or cotton balls. Ralph Peterson, Ralph Peterson ([id number]) Peri-Wound Care Topical Primary Dressing Curad Oil Emulsion Dressing 3x3 (in/in) Discharge Instruction: Apply on wound bed Secondary Dressing ABD Pad 5x9 (in/in) Discharge Instruction: Cover with ABD pad Secured With 16M Medipore H Soft Cloth Surgical Tape, 2x2 (in/yd) Discharge Instruction: Secure Kerlix Kerlix Roll Sterile or Non-Sterile 6-ply 4.5x4 (yd/yd) Discharge Instruction: Apply Kerlix as directed Tubigrip Size E, 3.5x10 (in/yds) Discharge Instruction: Apply 3 Tubigrip E 3-finger-widths below knee to base of toes to secure dressing and/or for swelling. Compression Wrap Compression Stockings Add-Ons Electronic Signature(s) Signed: 11/02/2020 5:20:48 PM By: Donnamarie Poag Entered By: Donnamarie Poag on 11/02/2020 09:18:17 Ralph Peterson ([id number]) -------------------------------------------------------------------------------- Vitals Details Patient Name: Ralph Peterson Date of Service: 11/02/2020 9:00 AM Medical Record Number: [id number] Patient Account Number: [id number] Date of Birth/Sex: [birth year] (55 y.o. M) Treating RN: Donnamarie Poag Primary Care Vasilia Dise: Vidal Schwalbe Other Clinician: Referring Shanae Luo: Vidal Schwalbe Treating Ruthie Berch/Extender: Skipper Cliche in Treatment: 27 Vital Signs Time Taken: 09:02 Temperature (F): 98.4 Height (in): 72 Pulse (bpm): 80 Weight (lbs): 225 Respiratory Rate (breaths/min): 16 Body Mass Index (BMI): 30.5 Blood Pressure (mmHg): 144/78 Reference Range: 80 - 120 mg / dl Notes Reports A1C done 10/31/20 results 5.9 Electronic Signature(s) Signed: 11/02/2020 5:20:48 PM By:  Donnamarie Poag Entered ByDonnamarie Poag on 11/02/2020 09:07:41

## 2020-11-09 ENCOUNTER — Encounter: Payer: Medicare PPO | Admitting: Physician Assistant

## 2020-11-09 ENCOUNTER — Ambulatory Visit: Payer: Medicare PPO | Admitting: Podiatry

## 2020-11-09 ENCOUNTER — Other Ambulatory Visit: Payer: Self-pay

## 2020-11-09 DIAGNOSIS — E11621 Type 2 diabetes mellitus with foot ulcer: Secondary | ICD-10-CM | POA: Diagnosis not present

## 2020-11-09 NOTE — Progress Notes (Addendum)
MARCELUS, GARRIS ([id number]) Visit Report for 11/09/2020 Arrival Information Details Patient Name: NICKLAS, HEINTZ. Date of Service: 11/09/2020 11:00 AM Medical Record Number: [id number] Patient Account Number: [id number] Date of Birth/Sex: 1966/04/27 (55 y.o. M) Treating RN: Carlene Coria Primary Care Wendell Fiebig: Vidal Schwalbe Other Clinician: Jeanine Luz Referring Kaz Auld: Vidal Schwalbe Treating Yaniel Limbaugh/Extender: Skipper Cliche in Treatment: 28 Visit Information History Since Last Visit All ordered tests and consults were completed: No Patient Arrived: Ambulatory Added or deleted any medications: No Arrival Time: 11:14 Any new allergies or adverse reactions: No Accompanied By: son Had a fall or experienced change in No Transfer Assistance: None activities of daily living that may affect Patient Identification Verified: Yes risk of falls: Secondary Verification Process Completed: Yes Signs or symptoms of abuse/neglect since last visito No Patient Requires Transmission-Based Precautions: No Hospitalized since last visit: No Patient Has Alerts: No Implantable device outside of the clinic excluding No cellular tissue based products placed in the center since last visit: Has Dressing in Place as Prescribed: Yes Has Compression in Place as Prescribed: Yes Pain Present Now: No Electronic Signature(s) Signed: 11/15/2020 3:51:10 PM By: Carlene Coria RN Entered By: Carlene Coria on 11/09/2020 11:18:33 [name] ([id number]) -------------------------------------------------------------------------------- Clinic Level of Care Assessment Details Patient Name: [name] Date of Service: 11/09/2020 11:00 AM Medical Record Number: [id number] Patient Account Number: [id number] Date of Birth/Sex: [birth year] (55 y.o. M) Treating RN: Dolan Amen Primary Care Leanthony Rhett: Vidal Schwalbe Other Clinician: Jeanine Luz Referring Nyia Tsao: Vidal Schwalbe Treating Hondo Nanda/Extender: Skipper Cliche in Treatment: 28 Clinic Level of Care Assessment Items TOOL 1 Quantity Score '[]'$  - Use when EandM and Procedure is performed on INITIAL visit 0 ASSESSMENTS - Nursing Assessment / Reassessment '[]'$  - General Physical Exam (combine w/ comprehensive assessment (listed just below) when performed on new 0 pt. evals) '[]'$  - 0 Comprehensive Assessment (HX, ROS, Risk Assessments, Wounds Hx, etc.) ASSESSMENTS - Wound and Skin Assessment / Reassessment '[]'$  - Dermatologic / Skin Assessment (not related to wound area) 0 ASSESSMENTS - Ostomy and/or Continence Assessment and Care '[]'$  - Incontinence Assessment and Management 0 '[]'$  - 0 Ostomy Care Assessment and Management (repouching, etc.) PROCESS - Coordination of Care '[]'$  - Simple Patient / Family Education for ongoing care 0 '[]'$  - 0 Complex (extensive) Patient / Family Education for ongoing care '[]'$  - 0 Staff obtains Programmer, systems, Records, Test Results / Process Orders '[]'$  - 0 Staff telephones HHA, Nursing Homes / Clarify orders / etc '[]'$  - 0 Routine Transfer to another Facility (non-emergent condition) '[]'$  - 0 Routine Hospital Admission (non-emergent condition) '[]'$  - 0 New Admissions / Biomedical engineer / Ordering NPWT, Apligraf, etc. '[]'$  - 0 Emergency Hospital Admission (emergent condition) PROCESS - Special Needs '[]'$  - Pediatric / Minor Patient Management 0 '[]'$  - 0 Isolation Patient Management '[]'$  - 0 Hearing / Language / Visual special needs '[]'$  - 0 Assessment of Community assistance (transportation, D/C planning, etc.) '[]'$  - 0 Additional assistance / Altered mentation '[]'$  - 0 Support Surface(s) Assessment (bed, cushion, seat, etc.) INTERVENTIONS - Miscellaneous '[]'$  - External ear exam 0 '[]'$  - 0 Patient Transfer (multiple staff / Civil Service fast streamer / Similar devices) '[]'$  - 0 Simple Staple / Suture removal (25 or less) '[]'$  - 0 Complex Staple / Suture removal (26 or more) '[]'$  - 0 Hypo/Hyperglycemic  Management (do not check if billed separately) '[]'$  - 0 Ankle / Brachial Index (ABI) - do not check if billed separately Has the patient been seen at the  hospital within the last three years: Yes Total Score: 0 Level Of Care: ____ [name] ([id number]) Electronic Signature(s) Signed: 11/09/2020 5:15:58 PM By: Georges Mouse, Minus Breeding RN Entered By: Georges Mouse, Minus Breeding on 11/09/2020 12:05:23 [name] ([id number]) -------------------------------------------------------------------------------- Encounter Discharge Information Details Patient Name: [name] Date of Service: 11/09/2020 11:00 AM Medical Record Number: [id number] Patient Account Number: [id number] Date of Birth/Sex: 18-Dec-1965 (55 y.o. M) Treating RN: Dolan Amen Primary Care Kirtan Sada: Vidal Schwalbe Other Clinician: Jeanine Luz Referring Ihan Pat: Vidal Schwalbe Treating Makaleigh Reinard/Extender: Skipper Cliche in Treatment: 28 Encounter Discharge Information Items Post Procedure Vitals Discharge Condition: Stable Temperature (F): 98.3 Ambulatory Status: Wheelchair Pulse (bpm): 72 Discharge Destination: Home Respiratory Rate (breaths/min): 18 Transportation: Private Auto Blood Pressure (mmHg): 123/72 Accompanied By: son Schedule Follow-up Appointment: Yes Clinical Summary of Care: Electronic Signature(s) Signed: 11/09/2020 4:23:12 PM By: Jeanine Luz Entered By: Jeanine Luz on 11/09/2020 12:25:20 [name] ([id number]) -------------------------------------------------------------------------------- Lower Extremity Assessment Details Patient Name: [name]. Date of Service: 11/09/2020 11:00 AM Medical Record Number: [id number] Patient Account Number: [id number] Date of Birth/Sex: 05-21-1966 (55 y.o. M) Treating RN: Carlene Coria Primary Care Quandra Fedorchak: Vidal Schwalbe Other Clinician: Jeanine Luz Referring Maxden Naji: Vidal Schwalbe Treating  Carlesha Seiple/Extender: Skipper Cliche in Treatment: 28 Edema Assessment Assessed: [Left: No] [Right: No] [Left: Edema] [Right: :] Calf Left: Right: Point of Measurement: 38 cm From Medial Instep 31 cm Ankle Left: Right: Point of Measurement: 10 cm From Medial Instep 21 cm Vascular Assessment Pulses: Dorsalis Pedis Palpable: [Right:Yes] Electronic Signature(s) Signed: 11/15/2020 3:51:10 PM By: Carlene Coria RN Entered By: Carlene Coria on 11/09/2020 11:37:29 [name] ([id number]) -------------------------------------------------------------------------------- Multi Wound Chart Details Patient Name: [name]. Date of Service: 11/09/2020 11:00 AM Medical Record Number: [id number] Patient Account Number: [id number] Date of Birth/Sex: 1966-06-03 (55 y.o. M) Treating RN: Dolan Amen Primary Care Jomaira Darr: Vidal Schwalbe Other Clinician: Jeanine Luz Referring Peggie Hornak: Vidal Schwalbe Treating Adalaide Jaskolski/Extender: Skipper Cliche in Treatment: 28 Vital Signs Height(in): 72 Pulse(bpm): 7 Weight(lbs): 225 Blood Pressure(mmHg): 123/72 Body Mass Index(BMI): 31 Temperature(F): 98.34 Respiratory Rate(breaths/min): 18 Photos: Wound Location: Right, Distal, Lateral Foot Right, Distal, Dorsal Foot Right, Proximal, Lateral Foot Wounding Event: Pressure Injury Surgical Injury Gradually Appeared Primary Etiology: Pressure Ulcer Open Surgical Wound Pressure Ulcer Comorbid History: Arrhythmia, Congestive Heart Arrhythmia, Congestive Heart Arrhythmia, Congestive Heart Failure, Coronary Artery Disease, Failure, Coronary Artery Disease, Failure, Coronary Artery Disease, Hypertension, Myocardial Infarction, Hypertension, Myocardial Infarction, Hypertension, Myocardial Infarction, Peripheral Venous Disease, Type II Peripheral Venous Disease, Type II Peripheral Venous Disease, Type II Diabetes, Neuropathy Diabetes, Neuropathy Diabetes, Neuropathy Date Acquired:  02/20/2020 04/20/2020 09/07/2020 Weeks of Treatment: '28 28 6 '$ Wound Status: Open Open Open Wound Recurrence: No Yes No Measurements L x W x D (cm) 0.1x0.1x0.1 0.3x0.3x2 0.1x0.1x0.1 Area (cm) : 0.008 0.071 0.008 Volume (cm) : 0.001 0.141 0.001 % Reduction in Area: 99.80% 98.50% 93.70% % Reduction in Volume: 100.00% 90.00% 92.30% Starting Position 1 (o'clock): 5 Ending Position 1 (o'clock): 8 Maximum Distance 1 (cm): 1.5 Undermining: No Yes No Classification: Category/Stage IV Full Thickness Without Exposed Unstageable/Unclassified Support Structures Exudate Amount: Small Medium None Present Exudate Type: Serous Serosanguineous N/A Exudate Color: amber red, brown N/A Wound Margin: N/A Distinct, outline attached Flat and Intact Granulation Amount: None Present (0%) Large (67-100%) None Present (0%) Granulation Quality: N/A Red N/A Necrotic Amount: Large (67-100%) None Present (0%) Large (67-100%) Necrotic Tissue: Eschar N/A Eschar Exposed Structures: Fascia: No Fat Layer (Subcutaneous Tissue): Fascia: No Fat Layer (Subcutaneous Tissue):  Yes Fat Layer (Subcutaneous Tissue): No Fascia: No No Tendon: No Tendon: No Tendon: No Muscle: No Muscle: No Muscle: No Joint: No Joint: No Joint: No Bone: No Bone: No Bone: No Epithelialization: None Medium (34-66%) None Wound Number: 9 N/A N/A Photos: N/A N/A TYVONE, STEINWAND ([id number]) Wound Location: Right, Medial Lower Leg N/A N/A Wounding Event: Blister N/A N/A Primary Etiology: Diabetic Wound/Ulcer of the Lower N/A N/A Extremity Comorbid History: Arrhythmia, Congestive Heart N/A N/A Failure, Coronary Artery Disease, Hypertension, Myocardial Infarction, Peripheral Venous Disease, Type II Diabetes, Neuropathy Date Acquired: 10/16/2020 N/A N/A Weeks of Treatment: 3 N/A N/A Wound Status: Healed - Epithelialized N/A N/A Wound Recurrence: No N/A N/A Measurements L x W x D (cm) 0x0x0 N/A N/A Area (cm) : 0 N/A N/A Volume  (cm) : 0 N/A N/A % Reduction in Area: 100.00% N/A N/A % Reduction in Volume: 100.00% N/A N/A Undermining: No N/A N/A Classification: Grade 1 N/A N/A Exudate Amount: None Present N/A N/A Exudate Type: N/A N/A N/A Exudate Color: N/A N/A N/A Wound Margin: N/A N/A N/A Granulation Amount: None Present (0%) N/A N/A Granulation Quality: N/A N/A N/A Necrotic Amount: None Present (0%) N/A N/A Necrotic Tissue: N/A N/A N/A Exposed Structures: Fascia: No N/A N/A Fat Layer (Subcutaneous Tissue): No Tendon: No Muscle: No Joint: No Bone: No Epithelialization: Large (67-100%) N/A N/A Treatment Notes Electronic Signature(s) Signed: 11/09/2020 5:15:58 PM By: Georges Mouse, Minus Breeding RN Entered By: Georges Mouse, Minus Breeding on 11/09/2020 11:56:04 [name] ([id number]) -------------------------------------------------------------------------------- Multi-Disciplinary Care Plan Details Patient Name: [name] Date of Service: 11/09/2020 11:00 AM Medical Record Number: [id number] Patient Account Number: [id number] Date of Birth/Sex: 05/25/1966 (55 y.o. M) Treating RN: Dolan Amen Primary Care Jessy Calixte: Vidal Schwalbe Other Clinician: Jeanine Luz Referring Chyann Ambrocio: Vidal Schwalbe Treating Phinneas Shakoor/Extender: Skipper Cliche in Treatment: 28 Active Inactive Necrotic Tissue Nursing Diagnoses: Impaired tissue integrity related to necrotic/devitalized tissue Knowledge deficit related to management of necrotic/devitalized tissue Goals: Necrotic/devitalized tissue will be minimized in the wound bed Date Initiated: 05/09/2020 Target Resolution Date: 05/09/2020 Goal Status: Active Interventions: Assess patient pain level pre-, during and post procedure and prior to discharge Provide education on necrotic tissue and debridement process Treatment Activities: Enzymatic debridement : 05/09/2020 Notes: Electronic Signature(s) Signed: 11/09/2020 5:15:58 PM By: Georges Mouse, Minus Breeding RN Entered By: Georges Mouse, Minus Breeding on 11/09/2020 11:55:56 [name] ([id number]) -------------------------------------------------------------------------------- Pain Assessment Details Patient Name: [name]. Date of Service: 11/09/2020 11:00 AM Medical Record Number: [id number] Patient Account Number: [id number] Date of Birth/Sex: Sep 24, 1965 (55 y.o. M) Treating RN: Carlene Coria Primary Care Erbie Arment: Vidal Schwalbe Other Clinician: Jeanine Luz Referring Breck Hollinger: Vidal Schwalbe Treating Malan Werk/Extender: Skipper Cliche in Treatment: 28 Active Problems Location of Pain Severity and Description of Pain Patient Has Paino No Site Locations Pain Management and Medication Current Pain Management: Electronic Signature(s) Signed: 11/15/2020 3:51:10 PM By: Carlene Coria RN Entered By: Carlene Coria on 11/09/2020 11:18:57 [name] ([id number]) -------------------------------------------------------------------------------- Patient/Caregiver Education Details Patient Name: [name] Date of Service: 11/09/2020 11:00 AM Medical Record Number: [id number] Patient Account Number: [id number] Date of Birth/Gender: 1966-06-06 (55 y.o. M) Treating RN: Dolan Amen Primary Care Physician: Vidal Schwalbe Other Clinician: Jeanine Luz Referring Physician: Vidal Schwalbe Treating Physician/Extender: Skipper Cliche in Treatment: 22 Education Assessment Education Provided To: Patient Education Topics Provided Wound/Skin Impairment: Methods: Explain/Verbal Responses: State content correctly Electronic Signature(s) Signed: 11/09/2020 5:15:58 PM By: Georges Mouse, Minus Breeding RN Entered By: Georges Mouse, Minus Breeding on 11/09/2020 12:05:45 Lines, [place] S. (  [id number]) -------------------------------------------------------------------------------- Wound Assessment Details Patient Name: SHELVIN, DRESS. Date of  Service: 11/09/2020 11:00 AM Medical Record Number: [id number] Patient Account Number: [id number] Date of Birth/Sex: 12-Apr-1966 (55 y.o. M) Treating RN: Carlene Coria Primary Care [place] Brogden: Vidal Schwalbe Other Clinician: Jeanine Luz Referring Johnnae Impastato: Vidal Schwalbe Treating Bracy [name]/Extender: Skipper Cliche in Treatment: 28 Wound Status Wound Number: 1 Primary Pressure Ulcer Etiology: Wound Location: Right, Distal, Lateral Foot Wound Healed - Epithelialized Wounding Event: Pressure Injury Status: Date Acquired: 02/20/2020 Comorbid Arrhythmia, Congestive Heart Failure, Coronary Artery Weeks Of Treatment: 28 History: Disease, Hypertension, Myocardial Infarction, Peripheral Clustered Wound: No Venous Disease, Type II Diabetes, Neuropathy Photos Wound Measurements Length: (cm) 0 Width: (cm) 0 Depth: (cm) 0 Area: (cm) 0 Volume: (cm) 0 % Reduction in Area: 100% % Reduction in Volume: 100% Epithelialization: Large (67-100%) Tunneling: No Undermining: No Wound Description Classification: Category/Stage IV Exudate Amount: None Present Foul Odor After Cleansing: No Slough/Fibrino No Wound Bed Granulation Amount: None Present (0%) Exposed Structure Necrotic Amount: None Present (0%) Fascia Exposed: No Fat Layer (Subcutaneous Tissue) Exposed: No Tendon Exposed: No Muscle Exposed: No Joint Exposed: No Bone Exposed: No Electronic Signature(s) Signed: 11/09/2020 5:15:58 PM By: Georges Mouse, Minus Breeding RN Signed: 11/15/2020 3:51:10 PM By: Carlene Coria RN Entered By: Georges Mouse, Minus Breeding on 11/09/2020 12:03:12 [name] ([id number]) -------------------------------------------------------------------------------- Wound Assessment Details Patient Name: [name]. Date of Service: 11/09/2020 11:00 AM Medical Record Number: [id number] Patient Account Number: [id number] Date of Birth/Sex: [birth year] (55 y.o. M) Treating RN: Carlene Coria Primary Care  Tracee Mccreery: Vidal Schwalbe Other Clinician: Jeanine Luz Referring Providencia Hottenstein: Vidal Schwalbe Treating Malley Hauter/Extender: Skipper Cliche in Treatment: 28 Wound Status Wound Number: 2R Primary Open Surgical Wound Etiology: Wound Location: Right, Distal, Dorsal Foot Wound Open Wounding Event: Surgical Injury Status: Date Acquired: 04/20/2020 Comorbid Arrhythmia, Congestive Heart Failure, Coronary Artery Weeks Of Treatment: 28 History: Disease, Hypertension, Myocardial Infarction, Peripheral Clustered Wound: No Venous Disease, Type II Diabetes, Neuropathy Photos Wound Measurements Length: (cm) 0.3 Width: (cm) 0.3 Depth: (cm) 2 Area: (cm) 0.071 Volume: (cm) 0.141 % Reduction in Area: 98.5% % Reduction in Volume: 90% Epithelialization: Medium (34-66%) Tunneling: No Undermining: Yes Starting Position (o'clock): 5 Ending Position (o'clock): 8 Maximum Distance: (cm) 1.5 Wound Description Classification: Full Thickness Without Exposed Support Structu Wound Margin: Distinct, outline attached Exudate Amount: Medium Exudate Type: Serosanguineous Exudate Color: red, brown res Foul Odor After Cleansing: No Slough/Fibrino No Wound Bed Granulation Amount: Large (67-100%) Exposed Structure Granulation Quality: Red Fascia Exposed: No Necrotic Amount: None Present (0%) Fat Layer (Subcutaneous Tissue) Exposed: Yes Tendon Exposed: No Muscle Exposed: No Joint Exposed: No Bone Exposed: No Treatment Notes Wound #2R (Foot) Wound Laterality: Dorsal, Right, Distal Cleanser Delpilar, Traquan S. ([id number]) Normal Saline Discharge Instruction: Wash your hands with soap and water. Remove old dressing, discard into plastic bag and place into trash. Cleanse the wound with Normal Saline prior to applying a clean dressing using gauze sponges, not tissues or cotton balls. Do not scrub or use excessive force. Pat dry using gauze sponges, not tissue or cotton balls. Peri-Wound  Care Topical Primary Dressing Hydrofera Blue Classic Foam Rope Dressing, 9x6 (mm/in) Discharge Instruction: Cut rope in half, 2 cm in length-pack in wound, moisten with a couple drops of saline after inserted (extras sent with patient) Secondary Dressing ABD Pad 5x9 (in/in) Discharge Instruction: Cover with ABD pad Secured With 11M Medipore H Soft Cloth Surgical Tape, 2x2 (in/yd) Discharge Instruction: Secure Kerlix Kerlix Roll Sterile or Non-Sterile 6-ply 4.5x4 (yd/yd) Discharge  Instruction: Apply Kerlix as directed Tubigrip Size E, 3.5x10 (in/yds) Discharge Instruction: Apply 3 Tubigrip E 3-finger-widths below knee to base of toes to secure dressing and/or for swelling. Compression Wrap Compression Stockings Add-Ons Electronic Signature(s) Signed: 11/15/2020 3:51:10 PM By: Carlene Coria RN Entered By: Carlene Coria on 11/09/2020 11:34:39 [name] ([id number]) -------------------------------------------------------------------------------- Wound Assessment Details Patient Name: [name]. Date of Service: 11/09/2020 11:00 AM Medical Record Number: [id number] Patient Account Number: [id number] Date of Birth/Sex: [birth year] (55 y.o. M) Treating RN: Carlene Coria Primary Care Cellie Dardis: Vidal Schwalbe Other Clinician: Jeanine Luz Referring Rashun Grattan: Vidal Schwalbe Treating Yael Angerer/Extender: Skipper Cliche in Treatment: 28 Wound Status Wound Number: 8 Primary Pressure Ulcer Etiology: Wound Location: Right, Proximal, Lateral Foot Wound Open Wounding Event: Gradually Appeared Status: Date Acquired: 09/07/2020 Comorbid Arrhythmia, Congestive Heart Failure, Coronary Artery Weeks Of Treatment: 6 History: Disease, Hypertension, Myocardial Infarction, Peripheral Clustered Wound: No Venous Disease, Type II Diabetes, Neuropathy Photos Wound Measurements Length: (cm) 0.1 Width: (cm) 0.1 Depth: (cm) 0.1 Area: (cm) 0.008 Volume: (cm) 0.001 % Reduction  in Area: 93.7% % Reduction in Volume: 92.3% Epithelialization: None Tunneling: No Undermining: No Wound Description Classification: Unstageable/Unclassified Wound Margin: Flat and Intact Exudate Amount: None Present Foul Odor After Cleansing: No Slough/Fibrino Yes Wound Bed Granulation Amount: None Present (0%) Exposed Structure Necrotic Amount: Large (67-100%) Fascia Exposed: No Necrotic Quality: Eschar Fat Layer (Subcutaneous Tissue) Exposed: No Tendon Exposed: No Muscle Exposed: No Joint Exposed: No Bone Exposed: No Treatment Notes Wound #8 (Foot) Wound Laterality: Right, Lateral, Proximal Cleanser Normal Saline Discharge Instruction: Wash your hands with soap and water. Remove old dressing, discard into plastic bag and place into trash. Cleanse the wound with Normal Saline prior to applying a clean dressing using gauze sponges, not tissues or cotton balls. Do not scrub or use excessive force. Pat dry using gauze sponges, not tissue or cotton balls. Peri-Wound Care MARGUIS, DURRER ([id number]) Topical Primary Dressing Curad Oil Emulsion Dressing 3x3 (in/in) Discharge Instruction: Apply on wound bed Secondary Dressing ABD Pad 5x9 (in/in) Discharge Instruction: Cover with ABD pad Secured With 84M Medipore H Soft Cloth Surgical Tape, 2x2 (in/yd) Discharge Instruction: Secure Kerlix Kerlix Roll Sterile or Non-Sterile 6-ply 4.5x4 (yd/yd) Discharge Instruction: Apply Kerlix as directed Tubigrip Size E, 3.5x10 (in/yds) Discharge Instruction: Apply 3 Tubigrip E 3-finger-widths below knee to base of toes to secure dressing and/or for swelling. Compression Wrap Compression Stockings Add-Ons Electronic Signature(s) Signed: 11/15/2020 3:51:10 PM By: Carlene Coria RN Entered By: Carlene Coria on 11/09/2020 12:42:26 [name] ([id number]) -------------------------------------------------------------------------------- Wound Assessment Details Patient Name:  [name]. Date of Service: 11/09/2020 11:00 AM Medical Record Number: [id number] Patient Account Number: [id number] Date of Birth/Sex: 01/26/1966 (55 y.o. M) Treating RN: Carlene Coria Primary Care Adael Culbreath: Vidal Schwalbe Other Clinician: Jeanine Luz Referring Everest Hacking: Vidal Schwalbe Treating Praise Stennett/Extender: Skipper Cliche in Treatment: 28 Wound Status Wound Number: 9 Primary Diabetic Wound/Ulcer of the Lower Extremity Etiology: Wound Location: Right, Medial Lower Leg Wound Healed - Epithelialized Wounding Event: Blister Status: Date Acquired: 10/16/2020 Comorbid Arrhythmia, Congestive Heart Failure, Coronary Artery Weeks Of Treatment: 3 History: Disease, Hypertension, Myocardial Infarction, Peripheral Clustered Wound: No Venous Disease, Type II Diabetes, Neuropathy Photos Wound Measurements Length: (cm) 0 Width: (cm) 0 Depth: (cm) 0 Area: (cm) 0 Volume: (cm) 0 % Reduction in Area: 100% % Reduction in Volume: 100% Epithelialization: Large (67-100%) Tunneling: No Undermining: No Wound Description Classification: Grade 1 Exudate Amount: None Present Foul Odor After Cleansing: No Slough/Fibrino Yes Wound Bed  Granulation Amount: None Present (0%) Exposed Structure Necrotic Amount: None Present (0%) Fascia Exposed: No Fat Layer (Subcutaneous Tissue) Exposed: No Tendon Exposed: No Muscle Exposed: No Joint Exposed: No Bone Exposed: No Electronic Signature(s) Signed: 11/09/2020 5:15:58 PM By: Georges Mouse, Minus Breeding RN Signed: 11/15/2020 3:51:10 PM By: Carlene Coria RN Entered By: Georges Mouse, Minus Breeding on 11/09/2020 11:55:31 [name] ([id number]) -------------------------------------------------------------------------------- [place] Details Patient Name: [name]. Date of Service: 11/09/2020 11:00 AM Medical Record Number: [id number] Patient Account Number: [id number] Date of Birth/Sex: [birth year] (55 y.o. M) Treating RN:  Carlene Coria Primary Care Dorlis Judice: Vidal Schwalbe Other Clinician: Jeanine Luz Referring Keigen Caddell: Vidal Schwalbe Treating Birdell Frasier/Extender: Skipper Cliche in Treatment: 28 Vital Signs Time Taken: 11:18 Temperature (F): 98.34 Height (in): 72 Pulse (bpm): 72 Weight (lbs): 225 Respiratory Rate (breaths/min): 18 Body Mass Index (BMI): 30.5 Blood Pressure (mmHg): 123/72 Reference Range: 80 - 120 mg / dl Electronic Signature(s) Signed: 11/15/2020 3:51:10 PM By: Carlene Coria RN Entered By: Carlene Coria on 11/09/2020 11:18:51

## 2020-11-09 NOTE — Progress Notes (Addendum)
LUCILE, BEL ([id number]) Visit Report for 11/09/2020 Chief Complaint Document Details Patient Name: Ralph Peterson, Ralph Peterson. Date of Service: 11/09/2020 11:00 AM Medical Record Number: [id number] Patient Account Number: [id number] Date of Birth/Sex: [birth year] (55 y.o. 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 M) Treating RN: Dolan Amen Primary Care Provider: Vidal Schwalbe Other Clinician: Jeanine Luz Referring Provider: Vidal Schwalbe Treating Provider/Extender: Skipper Cliche in Treatment: 28 Debridement Performed for Wound #8 Right,Proximal,Lateral Foot Assessment: Performed By: Physician Tommie Sams., PA-C Debridement Type: Debridement Level of Consciousness (Pre- Awake and Alert procedure): Pre-procedure Verification/Time Out Yes - 11:56 Taken: Start Time: 11:56 Total Area Debrided (L x W): 0.3 (cm) x 0.3 (cm) = 0.09 (cm) Tissue and other material Viable, Non-Viable, Slough, Subcutaneous, Slough debrided: Level: Skin/Subcutaneous Tissue Debridement Description: Excisional Instrument: Curette Bleeding: Minimum Hemostasis Achieved: Pressure Response to Treatment: Procedure was tolerated well Level of Consciousness  (Post- Awake and Alert procedure): Post Debridement Measurements of Total Wound Length: (cm) 0.3 Stage: Unstageable/Unclassified Width: (cm) 0.3 Depth: (cm) 0.2 Volume: (cm) 0.014 Character of Wound/Ulcer Post Debridement: Stable Post Procedure Diagnosis Same as Pre-procedure Electronic Signature(s) Signed: 11/09/2020 5:15:58 PM By: Charlett Nose RN Signed: 11/10/2020 1:09:49 PM By: Worthy Keeler PA-C Entered By: Georges Mouse, Minus Breeding on 11/09/2020 11:57:28 Ralph Peterson ([id number]) -------------------------------------------------------------------------------- HPI Details Patient Name: Ralph Peterson. Date of Service: 11/09/2020 11:00 AM Medical Record Number: [id number] Patient Account Number: [id number] Date of Birth/Sex: August 19, 1965 (55 y.o. M) Treating RN: Dolan Amen Primary Care Provider: Vidal Schwalbe Other Clinician: Jeanine Luz Referring Provider: Vidal Schwalbe Treating Provider/Extender: Skipper Cliche in Treatment: 28 History of Present Illness HPI Description: 08/17/2020 upon evaluation today patient actually appears to be doing decently well today which is good news in regard to the dorsal wound which is almost healed and the lateral wound also doing much better. With regard to the plantar wound this is still draining purulent drainage unfortunately the culture did reveal that he had Pseudomonas as well as Enterococcus. With that being said he is allergic to penicillin which took away the only oral medication for the Enterococcus I did look at linezolid as a possibility but unfortunately there were medication interactions. Also the same was true the Cipro where there were medication interactions. Nonetheless I want to see if we can get him into infectious disease to see if they can work with him to try to find possibly add at home IV antibiotic regimen that can help out at this point. No sharp debridement is can be necessary  today. 09/07/2020 upon evaluation today patient appears to be doing unfortunately not too well in regard to his wound. He has been tolerating the dressing changes without complication. Fortunately there is no sign of active infection at this time. No fevers, chills, nausea, vomiting, or diarrhea. The patient did have a repeat surgery going to clean out the abscess and try to work on getting things moving in a better direction about a week and a  half ago. He still has sutures in place. With that being said unfortunately he does not appear to be showing signs of a lot of improvement in my opinion. He does have sutures on the plantar aspect still in place and on the dorsal aspect he has a small opening which actually probes all the way down through and actually came out of the plantar aspect. I feel like that he really is having a lot of issues here and I am afraid that if we do not get this taken care of this is can end up with him having a much larger and more extensive amputation than what he was hoping to have to deal with with this. It has already been since June 2021 but has been dealing with this including all the time since the amputation. 09/14/2020 upon evaluation today patient appears to be doing really about the same in regard to the wound on the dorsal surface of his foot. The plantar foot unfortunately still has sutures in place I really cannot see how things are doing in that regard. With the dorsal foot however this appears to show signs of still having a small opening in the distal aspect which does actually probe down to bone there is also significant purulent drainage we did actually obtain a wound culture today to further evaluate the issue here. 09/21/2020 upon evaluation today patient appears to be doing about the same in regard to his wound on the dorsal surface of his foot and the plantar foot he still does not have the sutures out at this point. That should hopefully happen in the  next week. With that being said the main issue is he continues to have drainage here. It was noted that he had positive findings on culture for Enterobacter. With that being said he cannot take oral penicillin he could do vancomycin and I think that is good to be the ideal thing this to see if we can get him started on IV vancomycin at dialysis. We will get a get in touch with them to try to see if we can coordinate that being done there at the dialysis center. I think that is the ideal situation at this point I would probably start with just 2 weeks of therapy. 09/28/2020 upon evaluation today patient appears to be doing well with regard to his wounds in general. He also has back on the vancomycin after the culture results and this seems to be doing excellent for him. All the wounds are showing signs of good improvement. No fevers, chills, nausea, vomiting, or diarrhea. 10/05/2020 upon evaluation today patient appears to actually be doing excellent in regard to his foot. Fortunately there is a lot of signs of new granulation epithelization at this point. There does not appear to be any evidence of active infection which is great news and overall very pleased with where things stand. In fact the open wound along the plantar aspect of his foot as well as the dorsal part of his foot has dramatically improved since we went forward with the vancomycin and requested that the dialysis center provide this to him by way of IV antibiotics with his dialysis. Overall I think that has made a dramatic improvement and overall his wound seems to be doing significantly better at this point. No fevers, chills, nausea, vomiting, or diarrhea. The patient was set to have his second opinion appointment with podiatry [facility] next week but to be perfectly honest I am not even certain that that  is necessary at this point I do not think there is much that they would be able to do for him nor that he really would want them to do  considering how well things look at this time. 10/12/2020 on evaluation today patient appears to be doing excellent in regard to his wounds. In fact the only issue I see is that everything is dried out as far as the collagen is concerned. I do think we can try to keep this moist he will continue to make excellent progress as he has been. Overall I am extremely pleased with what I see today. 10/19/2020 upon evaluation today patient appears to be doing decently well in regard to the wounds on his foot he is can require some debridement in this location. Unfortunately is having some issues with blistering over the leg which is of greater concern at this point. Fortunately I do not see any signs of active infection which is great news. Nonetheless the blistering may just be related to venous insufficiency or subsequently could potentially be an issue with overall worsening with regard to an infection but again it is not really hot to touch. 10/26/2020 upon evaluation today patient appears to be doing well with regard to the majority of his wounds. Unfortunately the dorsal surface of his foot does not appear to be doing quite as well today compared to what it has been doing. I do think that we will need to address that currently. Everything else is showing signs of improvement. He did not get the prescription that I gave him previous as unfortunately the transmission failed. I did not know that until today when the patient mention to me that he never got the antibiotic. 11/02/20 upon evaluation today patient appears to be doing much better in regard to his wounds in general. I feel like that he is making good progress and even the top of the foot does not appear to be doing too poorly at this time. He did finally get the antibiotics which is good. Unfortunately it does not look like I am able to electronically sent to his pharmacy it kept being an error in transmission every time this was sent. We finally  decided to call it in. Nonetheless overall I think he is headed in the right direction based on what I am seeing. 11/09/2020 upon evaluation today patient appears to be doing well in general in regard to his wounds. I think that he is making some progress here. With that being said there is definitely still some areas of concern. Specifically the dorsal aspect of his foot distally and the lateral aspect of his foot proximally. Both of these areas are still open everything else appears to likely be closed today based on what I am seeing. Ralph Peterson, Ralph Peterson ([id number]) Electronic Signature(s) Signed: 11/09/2020 5:27:37 PM By: Worthy Keeler PA-C Entered By: Worthy Keeler on 11/09/2020 17:27:37 Ralph Peterson ([id number]) -------------------------------------------------------------------------------- Physical Exam Details Patient Name: Ralph Peterson Date of Service: 11/09/2020 11:00 AM Medical Record Number: [id number] Patient Account Number: [id number] Date of Birth/Sex: [birth year] (55 y.o. M) Treating RN: Dolan Amen Primary Care Provider: Vidal Schwalbe Other Clinician: Jeanine Luz Referring Provider: Vidal Schwalbe Treating Provider/Extender: Skipper Cliche in Treatment: 52 Constitutional Well-nourished and well-hydrated in no acute distress. Respiratory normal breathing without difficulty. Psychiatric this patient is able to make decisions and demonstrates good insight into disease process. Alert and Oriented x 3. pleasant and cooperative. Notes Patient's wound bed showed signs of  good granulation epithelization at this point. There does not appear to be any signs of infection I did perform some sharp [place] over the wound on the lateral aspect of the foot proximally this appears to be doing quite well however. The oil emulsion dressing seems to have done excellent for him over the past week. The distal wound is completely healed as is the top of the foot  and the leg. The distal portion of the top of the foot still does have a area that goes straight down in this has not granulated and yet I think this is still going to be something that we have to continue to work on. Electronic Signature(s) Signed: 11/09/2020 5:28:12 PM By: Worthy Keeler PA-C Entered By: Worthy Keeler on 11/09/2020 17:28:12 Ralph Peterson ([id number]) -------------------------------------------------------------------------------- Physician Orders Details Patient Name: Ralph Peterson Date of Service: 11/09/2020 11:00 AM Medical Record Number: [id number] Patient Account Number: [id number] Date of Birth/Sex: Feb 18, 1966 (55 y.o. 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 Bathing/ Shower/ Hygiene o Clean wound with Normal Saline or wound cleanser. Off-Loading o Open toe surgical shoe with peg assist. - Right foot o Other: - Betadine on heel, wear socks for protection Wound Treatment Wound #2R - Foot Wound Laterality: Dorsal, Right, Distal Cleanser: Normal Saline 3 x Per Week/30 Days Discharge Instructions: Wash your hands with soap and  water. Remove old dressing, discard into plastic bag and place into trash. Cleanse the wound with Normal Saline prior to applying a clean dressing using gauze sponges, not tissues or cotton balls. Do not scrub or use excessive force. Pat dry using gauze sponges, not tissue or cotton balls. Primary Dressing: Hydrofera Blue Classic Foam Rope Dressing, 9x6 (mm/in) 3 x Per Week/30 Days Discharge Instructions: Cut rope in half, 2 cm in length-pack in wound, moisten with a couple drops of saline after inserted (extras sent with patient) Secondary Dressing: ABD Pad 5x9 (in/in) 3 x Per Week/30 Days Discharge Instructions: Cover with ABD pad Secured With: 2M Medipore H Soft Cloth Surgical Tape, 2x2 (in/yd) 3 x Per Week/30 Days Discharge Instructions: Secure Kerlix Secured With: [employer] Sterile or Non-Sterile 6-ply 4.5x4 (yd/yd) 3 x Per Week/30 Days Discharge Instructions: Apply Kerlix as directed Secured With: Tubigrip Size E, 3.5x10 (in/yds) 3 x Per Week/30 Days Discharge Instructions: Apply 3 Tubigrip E 3-finger-widths below knee to base of toes to secure dressing and/or for swelling. Wound #8 - Foot Wound Laterality: Right, Lateral, Proximal Cleanser: Normal Saline 3 x Per Week/30 Days Discharge Instructions: Wash your hands with soap and water. Remove old dressing, discard into plastic bag and place into trash. Cleanse the wound with Normal Saline prior to applying a clean dressing using gauze sponges, not tissues or cotton balls. Do not scrub or use excessive force. Pat dry using gauze sponges, not tissue  or cotton balls. Primary Dressing: [place]ad Oil Emulsion Dressing 3x3 (in/in) 3 x Per Week/30 Days Discharge Instructions: Apply on wound bed AUSTUN, DEBAERE ([id number]) Secondary Dressing: ABD Pad 5x9 (in/in) 3 x Per Week/30 Days Discharge Instructions: Cover with ABD pad Secured With: 71M Medipore H Soft Cloth Surgical Tape, 2x2 (in/yd) 3 x Per Week/30 Days Discharge Instructions:  Secure Kerlix Secured With: [employer] Sterile or Non-Sterile 6-ply 4.5x4 (yd/yd) 3 x Per Week/30 Days Discharge Instructions: Apply Kerlix as directed Secured With: Tubigrip Size E, 3.5x10 (in/yds) 3 x Per Week/30 Days Discharge Instructions: Apply 3 Tubigrip E 3-finger-widths below knee to base of toes to secure dressing and/or for swelling. Electronic Signature(s) Signed: 11/09/2020 5:15:58 PM By: Georges Mouse, Minus Breeding RN Signed: 11/10/2020 1:09:49 PM By: Worthy Keeler PA-C Entered By: Georges Mouse, Minus Breeding on 11/09/2020 12:05:06 Ralph Peterson ([id number]) -------------------------------------------------------------------------------- Problem List Details Patient Name: Ralph Peterson Date of Service: 11/09/2020 11:00 AM Medical Record Number: [id number] Patient Account Number: [id number] Date of Birth/Sex: 03-22-1966 (55 y.o. M) Treating RN: Dolan Amen Primary Care Provider: Vidal Schwalbe Other Clinician: Jeanine Luz Referring Provider: Vidal Schwalbe Treating Provider/Extender: Skipper Cliche in Treatment: 28 Active Problems ICD-10 Encounter Code Description Active Date MDM Diagnosis L89.893 Pressure ulcer of other site, stage 3 04/27/2020 No Yes T81.31XA Disruption of external operation (surgical) wound, not elsewhere 04/27/2020 No Yes classified, initial encounter L97.512 Non-pressure chronic ulcer of other part of right foot with fat layer 04/27/2020 No Yes exposed L97.812 Non-pressure chronic ulcer of other part of right lower leg with fat layer 04/27/2020 No Yes exposed E11.621 Type 2 diabetes mellitus with foot ulcer 04/27/2020 No Yes I50.42 Chronic combined systolic (congestive) and diastolic (congestive) heart 04/27/2020 No Yes failure N18.6 End stage renal disease 04/27/2020 No Yes Z99.2 Dependence on renal dialysis 04/27/2020 No Yes Inactive Problems Resolved Problems Electronic Signature(s) Signed: 11/09/2020 11:11:34 AM By: Worthy Keeler  PA-C Entered By: Worthy Keeler on 11/09/2020 11:11:33 Ralph Peterson ([id number]) -------------------------------------------------------------------------------- Progress Note Details Patient Name: Ralph Peterson Date of Service: 11/09/2020 11:00 AM Medical Record Number: [id number] Patient Account Number: [id number] Date of Birth/Sex: May 01, 1966 (55 y.o. 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 With regard to the plantar wound this is still draining purulent drainage unfortunately the culture did reveal that he had Pseudomonas as well as Enterococcus. With that being said he is allergic to penicillin which took away the only oral medication for the Enterococcus I did look at linezolid as a possibility but unfortunately there were medication interactions. Also the same was true the Cipro where there were medication interactions. Nonetheless I want to see if we can get him into infectious disease to see if they can work with him to try to find possibly add at home IV antibiotic regimen that can help out at this point. No sharp debridement is can be necessary today. 09/07/2020 upon evaluation today patient appears to be doing unfortunately not too well in regard to his wound. He has been tolerating the dressing changes without  complication. Fortunately there is no sign of active infection at this time. No fevers, chills, nausea, vomiting, or  diarrhea. The patient did have a repeat surgery going to clean out the abscess and try to work on getting things moving in a better direction about a week and a half ago. He still has sutures in place. With that being said unfortunately he does not appear to be showing signs of a lot of improvement in my opinion. He does have sutures on the plantar aspect still in place and on the dorsal aspect he has a small opening which actually probes all the way down through and actually came out of the plantar aspect. I feel like that he really is having a lot of issues here and I am afraid that if we do not get this taken care of this is can end up with him having a much larger and more extensive amputation than what he was hoping to have to deal with with this. It has already been since June 2021 but has been dealing with this including all the time since the amputation. 09/14/2020 upon evaluation today patient appears to be doing really about the same in regard to the wound on the dorsal surface of his foot. The plantar foot unfortunately still has sutures in place I really cannot see how things are doing in that regard. With the dorsal foot however this appears to show signs of still having a small opening in the distal aspect which does actually probe down to bone there is also significant purulent drainage we did actually obtain a wound culture today to further evaluate the issue here. 09/21/2020 upon evaluation today patient appears to be doing about the same in regard to his wound on the dorsal surface of his foot and the plantar foot he still does not have the sutures out at this point. That should hopefully happen in the next week. With that being said the main issue is he continues to have drainage here. It was noted that he had positive findings on culture for Enterobacter. With that being said he cannot take oral penicillin he could do vancomycin and I think that is good to be the ideal thing this to  see if we can get him started on IV vancomycin at dialysis. We will get a get in touch with them to try to see if we can coordinate that being done there at the dialysis center. I think that is the ideal situation at this point I would probably start with just 2 weeks of therapy. 09/28/2020 upon evaluation today patient appears to be doing well with regard to his wounds in general. He also has back on the vancomycin after the culture results and this seems to be doing excellent for him. All the wounds are showing signs of good improvement. No fevers, chills, nausea, vomiting, or diarrhea. 10/05/2020 upon evaluation today patient appears to actually be doing excellent in regard to his foot. Fortunately there is a lot of signs of new granulation epithelization at this point. There does not appear to be any evidence of active infection which is great news and overall very pleased with where things stand. In fact the open wound along the plantar aspect of his foot as well as the dorsal part of his foot has dramatically improved since we went forward with the vancomycin and requested that the dialysis center provide this to him by way of IV antibiotics with his dialysis. Overall I think that has made  a dramatic improvement and overall his wound seems to be doing significantly better at this point. No fevers, chills, nausea, vomiting, or diarrhea. The patient was set to have his second opinion appointment with podiatry [facility] next week but to be perfectly honest I am not even certain that that is necessary at this point I do not think there is much that they would be able to do for him nor that he really would want them to do considering how well things look at this time. 10/12/2020 on evaluation today patient appears to be doing excellent in regard to his wounds. In fact the only issue I see is that everything is dried out as far as the collagen is concerned. I do think we can try to keep this moist he will  continue to make excellent progress as he has been. Overall I am extremely pleased with what I see today. 10/19/2020 upon evaluation today patient appears to be doing decently well in regard to the wounds on his foot he is can require some debridement in this location. Unfortunately is having some issues with blistering over the leg which is of greater concern at this point. Fortunately I do not see any signs of active infection which is great news. Nonetheless the blistering may just be related to venous insufficiency or subsequently could potentially be an issue with overall worsening with regard to an infection but again it is not really hot to touch. 10/26/2020 upon evaluation today patient appears to be doing well with regard to the majority of his wounds. Unfortunately the dorsal surface of his foot does not appear to be doing quite as well today compared to what it has been doing. I do think that we will need to address that currently. Everything else is showing signs of improvement. He did not get the prescription that I gave him previous as unfortunately the transmission failed. I did not know that until today when the patient mention to me that he never got the antibiotic. 11/02/20 upon evaluation today patient appears to be doing much better in regard to his wounds in general. I feel like that he is making good progress and even the top of the foot does not appear to be doing too poorly at this time. He did finally get the antibiotics which is good. Unfortunately it does not look like I am able to electronically sent to his pharmacy it kept being an error in transmission every time this was sent. We finally decided to call it in. Nonetheless overall I think he is headed in the right direction based on what I am seeing. Ralph Peterson, Ralph Peterson ([id number]) 11/09/2020 upon evaluation today patient appears to be doing well in general in regard to his wounds. I think that he is making some  progress here. With that being said there is definitely still some areas of concern. Specifically the dorsal aspect of his foot distally and the lateral aspect of his foot proximally. Both of these areas are still open everything else appears to likely be closed today based on what I am seeing. Objective Constitutional Well-nourished and well-hydrated in no acute distress. Vitals Time Taken: 11:18 AM, Height: 72 in, Weight: 225 lbs, BMI: 30.5, Temperature: 98.34 F, Pulse: 72 bpm, Respiratory Rate: 18 breaths/min, Blood Pressure: 123/72 mmHg. Respiratory normal breathing without difficulty. Psychiatric this patient is able to make decisions and demonstrates good insight into disease process. Alert and Oriented x 3. pleasant and cooperative. General Notes: Patient's wound bed showed signs  of good granulation epithelization at this point. There does not appear to be any signs of infection I did perform some sharp [place] over the wound on the lateral aspect of the foot proximally this appears to be doing quite well however. The oil emulsion dressing seems to have done excellent for him over the past week. The distal wound is completely healed as is the top of the foot and the leg. The distal portion of the top of the foot still does have a area that goes straight down in this has not granulated and yet I think this is still going to be something that we have to continue to work on. Integumentary (Hair, Skin) Wound #1 status is Healed - Epithelialized. Original cause of wound was Pressure Injury. The date acquired was: 02/20/2020. The wound has been in treatment 28 weeks. The wound is located on the Right,Distal,Lateral Foot. The wound measures 0cm length x 0cm width x 0cm depth; 0cm^2 area and 0cm^3 volume. There is no tunneling or undermining noted. There is a none present amount of drainage noted. There is no granulation within the wound bed. There is no necrotic tissue within the wound  bed. Wound #2R status is Open. Original cause of wound was Surgical Injury. The date acquired was: 04/20/2020. The wound has been in treatment 28 weeks. The wound is located on the Right,Distal,Dorsal Foot. The wound measures 0.3cm length x 0.3cm width x 2cm depth; 0.071cm^2 area and 0.141cm^3 volume. There is Fat Layer (Subcutaneous Tissue) exposed. There is no tunneling noted, however, there is undermining starting at 5:00 and ending at 8:00 with a maximum distance of 1.5cm. There is a medium amount of serosanguineous drainage noted. The wound margin is distinct with the outline attached to the wound base. There is large (67-100%) red granulation within the wound bed. There is no necrotic tissue within the wound bed. Wound #8 status is Open. Original cause of wound was Gradually Appeared. The date acquired was: 09/07/2020. The wound has been in treatment 6 weeks. The wound is located on the Right,Proximal,Lateral Foot. The wound measures 0.1cm length x 0.1cm width x 0.1cm depth; 0.008cm^2 area and 0.001cm^3 volume. There is no tunneling or undermining noted. There is a none present amount of drainage noted. The wound margin is flat and intact. There is no granulation within the wound bed. There is a large (67-100%) amount of necrotic tissue within the wound bed including Eschar. Wound #9 status is Healed - Epithelialized. Original cause of wound was Blister. The date acquired was: 10/16/2020. The wound has been in treatment 3 weeks. The wound is located on the Right,Medial Lower Leg. The wound measures 0cm length x 0cm width x 0cm depth; 0cm^2 area and 0cm^3 volume. There is no tunneling or undermining noted. There is a none present amount of drainage noted. There is no granulation within the wound bed. There is no necrotic tissue within the wound bed. 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 ([id number]) Procedures Wound #8 Pre-procedure diagnosis of Wound #8 is a Pressure Ulcer located on the Right,Proximal,Lateral Foot . There was a  Excisional Skin/Subcutaneous Tissue Debridement with a total area of 0.09 sq cm performed by Tommie Sams., PA-C. With the following instrument(s): Curette to remove Viable and Non-Viable tissue/material. Material removed includes Subcutaneous Tissue and Slough and. A time out was conducted at 11:56, prior to the start of the procedure. A Minimum amount of bleeding was controlled with Pressure. The procedure was tolerated well. Post Debridement Measurements: 0.3cm length x 0.3cm width x 0.2cm depth; 0.014cm^3 volume. Post debridement Stage noted as Unstageable/Unclassified. Character of Wound/Ulcer Post Debridement is stable. Post procedure Diagnosis Wound #8: Same as Pre-Procedure Plan Follow-up Appointments: Return Appointment in 1 week. Bathing/ Shower/ Hygiene: Clean wound with Normal Saline or wound cleanser. Off-Loading: Open toe surgical shoe with peg assist. - Right foot Other: - Betadine on heel, wear socks for protection WOUND #2R: - Foot Wound Laterality: Dorsal, Right, Distal Cleanser: Normal Saline 3 x Per Week/30 Days Discharge Instructions: Wash your hands with soap and water. Remove old dressing, discard into plastic bag and place into trash. Cleanse the wound with Normal Saline prior to applying a clean dressing using gauze sponges, not tissues or cotton balls. Do not scrub or use excessive force. Pat dry using gauze sponges, not tissue or cotton  balls. Primary Dressing: Hydrofera Blue Classic Foam Rope Dressing, 9x6 (mm/in) 3 x Per Week/30 Days Discharge Instructions: Cut rope in half, 2 cm in length-pack in wound, moisten with a couple drops of saline after inserted (extras sent with patient) Secondary Dressing: ABD Pad 5x9 (in/in) 3 x Per Week/30 Days Discharge Instructions: Cover with ABD pad Secured With: 61M Medipore H Soft Cloth Surgical Tape, 2x2 (in/yd) 3 x Per Week/30 Days Discharge Instructions: Secure Kerlix Secured With: [employer] Sterile or Non-Sterile 6-ply 4.5x4 (yd/yd) 3 x Per Week/30 Days Discharge Instructions: Apply Kerlix as directed Secured With: Tubigrip Size E, 3.5x10 (in/yds) 3 x Per Week/30 Days Discharge Instructions: Apply 3 Tubigrip E 3-finger-widths below knee to base of toes to secure dressing and/or for swelling. WOUND #8: - Foot Wound Laterality: Right, Lateral, Proximal Cleanser: Normal Saline 3 x Per Week/30 Days Discharge Instructions: Wash your hands with soap and water. Remove old dressing, discard into plastic bag and place into trash. Cleanse the wound with Normal Saline prior to applying a clean dressing using gauze sponges, not tissues or cotton balls. Do not scrub or use excessive force. Pat dry using gauze sponges, not tissue or cotton balls. Primary Dressing: [place] Oil Emulsion Dressing 3x3 (in/in) 3 x Per Week/30 Days Discharge Instructions: Apply on wound bed Secondary Dressing: ABD Pad 5x9 (in/in) 3 x Per Week/30 Days Discharge Instructions: Cover with ABD pad Secured With: 61M Medipore H Soft Cloth Surgical Tape, 2x2 (in/yd) 3 x Per Week/30 Days Discharge Instructions: Secure Kerlix Secured With: [employer] Sterile or Non-Sterile 6-ply 4.5x4 (yd/yd) 3 x Per Week/30 Days Discharge Instructions: Apply Kerlix as directed Secured With: Tubigrip Size E, 3.5x10 (in/yds) 3 x Per Week/30 Days Discharge Instructions: Apply 3 Tubigrip E 3-finger-widths below knee to base of toes to secure  dressing and/or for swelling. 1. I would recommend that we continue with the oil emulsion dressing for the lateral portion of the foot proximally which is still open this to great in the past week I think that we can continue as such. 2. I am also can recommend that we have the patient continue with the Hydrofera Blue rope packing into the distal/dorsal portion of his foot I think this is doing a good job at keeping it  open and allowing [place] to drain out any fluids or nothing collects and causes a significant issue here. 3. Also can recommend that we continue with the Tubigrip which seems to be doing well for him. We will see patient back for reevaluation in 1 week here in the clinic. If anything worsens or changes patient will contact our office for additional recommendations. Electronic Signature(s) Signed: 11/09/2020 5:28:39 PM By: Ferd Hibbs ([id number]) Entered By: Worthy Keeler on 11/09/2020 17:28:39 Ralph Peterson ([id number]) -------------------------------------------------------------------------------- SuperBill Details Patient Name: Ralph Peterson Date of Service: 11/09/2020 Medical Record Number: [id number] Patient Account Number: [id number] Date of Birth/Sex: [birth year] (55 y.o. M) Treating RN: Dolan Amen Primary Care Provider: Vidal Schwalbe Other Clinician: Jeanine Luz Referring Provider: Vidal Schwalbe Treating Provider/Extender: Skipper Cliche in Treatment: 28 Diagnosis Coding ICD-10 Codes Code Description [phone] Pressure ulcer of other site, stage 3 T81.31XA Disruption of external operation (surgical) wound, not elsewhere classified, initial encounter 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d L97.812 Non-pressure chronic ulcer of other part of right lower leg with fat layer exposed E11.621 Type 2 diabetes mellitus with foot ulcer I50.42 Chronic combined systolic  (congestive) and diastolic (congestive) heart failure N18.6 End stage renal disease Z99.2 Dependence on renal dialysis Facility Procedures CPT4 Code: [id number] Description: [id number] - DEB SUBQ TISSUE 20 SQ CM/< Modifier: Quantity: 1 CPT4 Code: Description: ICD-10 Diagnosis Description L97.512 Non-pressure chronic ulcer of other part of right foot with fat layer ex Modifier: posed Quantity: Physician Procedures CPT4 [id number] Description: 11042 - WC PHYS SUBQ TISS 20 SQ CM Modifier: Quantity: 1 CPT4 Code: Description: ICD-10 Diagnosis Description L97.512 Non-pressure chronic ulcer of other part of right foot with fat layer ex Modifier: posed Quantity: Electronic Signature(s) Signed: 11/09/2020 5:29:04 PM By: Worthy Keeler PA-C Previous Signature: 11/09/2020 5:15:58 PM Version By: Georges Mouse, Minus Breeding RN Entered By: Worthy Keeler on 11/09/2020 17:29:03

## 2020-11-13 ENCOUNTER — Telehealth (INDEPENDENT_AMBULATORY_CARE_PROVIDER_SITE_OTHER): Payer: Self-pay

## 2020-11-13 NOTE — Telephone Encounter (Signed)
Patient was called by Ralph Ditch NP to check on wound healing and per patient he is healing well. Per Ralph Peterson the patient's RLE angio scheduled with Dr. Delana Meyer for 11/14/20 needs to be canceled for now. Patient has been canceled.

## 2020-11-14 ENCOUNTER — Other Ambulatory Visit: Payer: Self-pay

## 2020-11-14 ENCOUNTER — Ambulatory Visit: Admission: RE | Admit: 2020-11-14 | Payer: Medicare PPO | Source: Home / Self Care | Admitting: Vascular Surgery

## 2020-11-14 ENCOUNTER — Ambulatory Visit (INDEPENDENT_AMBULATORY_CARE_PROVIDER_SITE_OTHER): Payer: Medicare PPO | Admitting: Podiatry

## 2020-11-14 ENCOUNTER — Encounter: Payer: Self-pay | Admitting: Podiatry

## 2020-11-14 ENCOUNTER — Encounter: Admission: RE | Payer: Self-pay | Source: Home / Self Care

## 2020-11-14 DIAGNOSIS — Z9889 Other specified postprocedural states: Secondary | ICD-10-CM

## 2020-11-14 DIAGNOSIS — L97909 Non-pressure chronic ulcer of unspecified part of unspecified lower leg with unspecified severity: Secondary | ICD-10-CM

## 2020-11-14 DIAGNOSIS — L97512 Non-pressure chronic ulcer of other part of right foot with fat layer exposed: Secondary | ICD-10-CM

## 2020-11-14 SURGERY — LOWER EXTREMITY ANGIOGRAPHY
Anesthesia: Moderate Sedation | Site: Leg Lower | Laterality: Right

## 2020-11-14 NOTE — Progress Notes (Signed)
Subjective:  Patient ID: Ralph Peterson, male    DOB: 02-28-1966,  MRN: [id number]  Chief Complaint  Patient presents with  . Routine Post Op    DOS 08/26/2020 IRRIGATION AND DEBRIDEMENT OF RIGHT FOOT   "its doing great"     55 y.o. male returns for post-op check.  Patient states is doing well.  He states that he does not have any pain.  Mild tenderness to the area.  He has been staying off as much as he can.  He has been going to wound care center for further wound management.  He states is going really well.  He denies any other acute complaints.  Review of Systems: Negative except as noted in the HPI. Denies N/V/F/Ch.  Past Medical History:  Diagnosis Date  . Anxiety   . Arrhythmia    atrial fibrillation  . Atrial fibrillation ([place])   . CAD (coronary artery disease)    a. 09/2010 Cath/PCI ([facility]): LM nl, LAD 31m D1 80 (small), LCX 365mOM1 30, RI 70 (small), RCA 70 (DES).  . CHF (congestive heart failure) ([facility]  . Coronary artery disease   . COVID-19 virus infection 07/2019  . Diabetes mellitus without complication ([facility]  . ESR[place] ([place] stage renal disease) ([facility]  . Hyperlipidemia   . Hypertension   . Ischemic cardiomyopathy    a.  12/2011 Echo ([facility]): NL EF, mod LVH. Mild AS/MS, triv PR/TR. 07/2019 Echo: EF 25-30%, GR1 DD, inf/post HK, low nl RV fxn, mildly dil RA, triv MR, mild Ao sclerosis w/o stenosis.  . NSTEMI (non-ST elevated myocardial infarction) ([facility]  . PAD (peripheral artery disease) ([facility]   a. 03/2016 s/p PTA/DEB R SFA/popliteal/peroneal; b. 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 08/2017 Atherectomy/DEB dist L SFA/popliteal. PTA of L AT; d. 06/2018 PTA/DEB L SFA/popliteal/PT/AT; e. 01/2019 Stable ABIs.  . Sleep apnea     Current Outpatient Medications:  .  sulfamethoxazole-trimethoprim (BACTRIM DS) 800-160 MG tablet, Take by mouth., Disp: , Rfl:  .  albuterol (VENTOLIN HFA) 108 (90 Base) MCG/ACT inhaler, Inhale 2 puffs into the lungs every 6 (six) hours  as needed for wheezing or shortness of breath., Disp: , Rfl:  .  apixaban (ELIQUIS) 5 MG TABS tablet, Take 1 tablet (5 mg total) by mouth 2 (two) times daily., Disp: 60 tablet, Rfl: 3 .  atorvastatin (LIPITOR) 40 MG tablet, Take 40 mg by mouth at bedtime., Disp: , Rfl: 1 .  cinacalcet (SENSIPAR) 60 MG tablet, Take 60 mg by mouth at bedtime., Disp: , Rfl:  .  ciprofloxacin (CIPRO) 500 MG tablet, Take 1 tablet (500 mg total) by mouth 2 (two) times daily. (Patient not taking: Reported on 10/12/2020), Disp: 20 tablet, Rfl: 0 .  escitalopram (LEXAPRO) 10 MG tablet, Take 10 mg by mouth at bedtime., Disp: , Rfl:  .  fluticasone (FLONASE) 50 MCG/ACT nasal spray, Place 2 sprays into both nostrils 2 (two) times daily., Disp: , Rfl:  .  glipiZIDE (GLUCOTROL) 5 MG tablet, Take 5 mg by mouth daily., Disp: , Rfl:  .  insulin detemir (LEVEMIR) 100 UNIT/ML injection, Inject 0.05 mLs (5 Units total) into the skin daily. (Patient taking differently: Inject 5 Units into the skin at bedtime.), Disp: , Rfl:  .  loperamide (IMODIUM) 2 MG capsule, Take 2 mg by mouth daily as needed for diarrhea or loose stools., Disp: , Rfl:  .  metoprolol succinate (TOPROL-XL) 50 MG 24 hr tablet, Take 50 mg  by mouth 2 (two) times daily., Disp: , Rfl:  .  montelukast (SINGULAIR) 10 MG tablet, Take 10 mg by mouth at bedtime., Disp: , Rfl:  .  omeprazole (PRILOSEC) 20 MG capsule, Take 20 mg by mouth at bedtime., Disp: , Rfl:  .  sevelamer carbonate (RENVELA) 800 MG tablet, Take 1,600-3,200 mg by mouth See admin instructions. Take 4 tablets ('3200mg'$ ) three times daily with meals and take 2 tablet (1600 mg) by mouth with snacks, Disp: , Rfl:   Social History   Tobacco Use  Smoking Status Former Smoker  . Types: Cigarettes  . Quit date: 2010  . Years since quitting: 12.3  Smokeless Tobacco Never Used    Allergies  Allergen Reactions  . Oxycodone-Acetaminophen Itching  . Gabapentin Other (See Comments)    unknown  . Tramadol Other  (See Comments)    unknown  . Penicillins Swelling and Rash    Patient endorses itching/hives and tongue swelling with penicillin and amoxicillin at some point in the past.    Objective:   There were no vitals filed for this visit. There is no height or weight on file to calculate BMI. Constitutional Well developed. Well nourished.  Vascular Foot warm and well perfused. Capillary refill normal to all digits.   Neurologic Normal speech. Oriented to person, place, and time. Epicritic sensation to light touch grossly present bilaterally.  Dermatologic  skin completely reepithelialized.  No clinical signs of infection noted.  No wound noted.  No malodor present no probing notable noted.  Orthopedic: Tenderness to palpation noted about the surgical site.   Radiographs: None Assessment:   No diagnosis found. Plan:  Patient was evaluated and treated and all questions answered.  S/p foot surgery right -Progressing as expected post-operatively. -XR: None -WB Status: Partial weightbearing to the heel in [place] wedge shoe -Sutures: Removed no signs of dehiscence noted.  No clinical signs of infection noted. -Medications: None - I will defer further management to infectious disease as well as the wound care center.  Clinically his wound has improved considerably I took the stitches out today.  No deep ulceration noted.  However patient is a high risk option of losing the digit versus the foot.  Patient states understanding and will continue to monitor it.  He will be scheduled to see the wound care center again next week for further evaluation and management.  Given that the wound care center is managing this patient I will see him back in 8 weeks.  He states understanding  No follow-ups on file.

## 2020-11-16 ENCOUNTER — Other Ambulatory Visit: Payer: Self-pay

## 2020-11-16 ENCOUNTER — Encounter: Payer: Medicare PPO | Admitting: Internal Medicine

## 2020-11-16 DIAGNOSIS — E11621 Type 2 diabetes mellitus with foot ulcer: Secondary | ICD-10-CM | POA: Diagnosis not present

## 2020-11-16 NOTE — Progress Notes (Signed)
HARSHIT, PANTOJA ([id number]) Visit Report for 11/16/2020 HPI Details Patient Name: Ralph Peterson, Ralph Peterson. Date of Service: 11/16/2020 11:00 AM Medical Record Number: [id number] Patient Account Number: [id number] Date of Birth/Sex: 1966/03/08 (55 y.o. M) Treating RN: Dolan Amen Primary Care Provider: Vidal Schwalbe Other Clinician: Jeanine Luz Referring Provider: Vidal Schwalbe Treating Provider/Extender: Tito Dine in Treatment: 103 History of Present Illness HPI Description: 08/17/2020 upon evaluation today patient actually appears to be doing decently well today which is good news in regard to the dorsal wound which is almost healed and the lateral wound also doing much better. With regard to the plantar wound this is still draining purulent drainage unfortunately the culture did reveal that he had Pseudomonas as well as Enterococcus. With that being said he is allergic to penicillin which took away the only oral medication for the Enterococcus I did look at linezolid as a possibility but unfortunately there were medication interactions. Also the same was true the Cipro where there were medication interactions. Nonetheless I want to see if we can get him into infectious disease to see if they can work with him to try to find possibly add at home IV antibiotic regimen that can help out at this point. No sharp debridement is can be necessary today. 09/07/2020 upon evaluation today patient appears to be doing unfortunately not too well in regard to his wound. He has been tolerating the dressing changes without complication. Fortunately there is no sign of active infection at this time. No fevers, chills, nausea, vomiting, or diarrhea. The patient did have a repeat surgery going to clean out the abscess and try to work on getting things moving in a better direction about a week and a half ago. He still has sutures in place. With that being said unfortunately he does not  appear to be showing signs of a lot of improvement in my opinion. He does have sutures on the plantar aspect still in place and on the dorsal aspect he has a small opening which actually probes all the way down through and actually came out of the plantar aspect. I feel like that he really is having a lot of issues here and I am afraid that if we do not get this taken care of this is can end up with him having a much larger and more extensive amputation than what he was hoping to have to deal with with this. It has already been since June 2021 but has been dealing with this including all the time since the amputation. 09/14/2020 upon evaluation today patient appears to be doing really about the same in regard to the wound on the dorsal surface of his foot. The plantar foot unfortunately still has sutures in place I really cannot see how things are doing in that regard. With the dorsal foot however this appears to show signs of still having a small opening in the distal aspect which does actually probe down to bone there is also significant purulent drainage we did actually obtain a wound culture today to further evaluate the issue here. 09/21/2020 upon evaluation today patient appears to be doing about the same in regard to his wound on the dorsal surface of his foot and the plantar foot he still does not have the sutures out at this point. That should hopefully happen in the next week. With that being said the main issue is he continues to have drainage here. It was noted that he had positive findings on culture for  Enterobacter. With that being said he cannot take oral penicillin he could do vancomycin and I think that is good to be the ideal thing this to see if we can get him started on IV vancomycin at dialysis. We will get a get in touch with them to try to see if we can coordinate that being done there at the dialysis center. I think that is the ideal situation at this point I would probably start  with just 2 weeks of therapy. 09/28/2020 upon evaluation today patient appears to be doing well with regard to his wounds in general. He also has back on the vancomycin after the culture results and this seems to be doing excellent for him. All the wounds are showing signs of good improvement. No fevers, chills, nausea, vomiting, or diarrhea. 10/05/2020 upon evaluation today patient appears to actually be doing excellent in regard to his foot. Fortunately there is a lot of signs of new granulation epithelization at this point. There does not appear to be any evidence of active infection which is great news and overall very pleased with where things stand. In fact the open wound along the plantar aspect of his foot as well as the dorsal part of his foot has dramatically improved since we went forward with the vancomycin and requested that the dialysis center provide this to him by way of IV antibiotics with his dialysis. Overall I think that has made a dramatic improvement and overall his wound seems to be doing significantly better at this point. No fevers, chills, nausea, vomiting, or diarrhea. The patient was set to have his second opinion appointment with podiatry [facility] next week but to be perfectly honest I am not even certain that that is necessary at this point I do not think there is much that they would be able to do for him nor that he really would want them to do considering how well things look at this time. 10/12/2020 on evaluation today patient appears to be doing excellent in regard to his wounds. In fact the only issue I see is that everything is dried out as far as the collagen is concerned. I do think we can try to keep this moist he will continue to make excellent progress as he has been. Overall I am extremely pleased with what I see today. 10/19/2020 upon evaluation today patient appears to be doing decently well in regard to the wounds on his foot he is can require some debridement in  this location. Unfortunately is having some issues with blistering over the leg which is of greater concern at this point. Fortunately I do not see any signs of active infection which is great news. Nonetheless the blistering may just be related to venous insufficiency or subsequently could potentially be an issue with overall worsening with regard to an infection but again it is not really hot to touch. 10/26/2020 upon evaluation today patient appears to be doing well with regard to the majority of his wounds. Unfortunately the dorsal surface of his foot does not appear to be doing quite as well today compared to what it has been doing. I do think that we will need to address that currently. Everything else is showing signs of improvement. He did not get the prescription that I gave him previous as unfortunately the transmission failed. I did not know that until today when the patient mention to me that he never got the antibiotic. 11/02/20 upon evaluation today patient appears to be doing much  better in regard to his wounds in general. I feel like that he is making good progress and even the top of the foot does not appear to be doing too poorly at this time. He did finally get the antibiotics which is good. Unfortunately it does not look like I am able to electronically sent to his pharmacy it kept being an error in transmission every time this was sent. We finally decided to call it in. Nonetheless overall I think he is headed in the right direction based on what I am seeing. ALICE, BADENHOP ([id number]) 11/09/2020 upon evaluation today patient appears to be doing well in general in regard to his wounds. I think that he is making some progress here. With that being said there is definitely still some areas of concern. Specifically the dorsal aspect of his foot distally and the lateral aspect of his foot proximally. Both of these areas are still open everything else appears to likely be closed  today based on what I am seeing. 4/28; the patient really looks quite good. The area over the base of the right fifth metatarsal and the base of the fifth MTP both look healed to me. 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 They are using Hydrofera Blue rope but I think this is far too small wound now for this Electronic Signature(s) Signed: 11/16/2020 5:07:17 PM By: Linton Ham MD Entered By: Linton Ham on 11/16/2020 12:44:47 Ralph Peterson ([id number]) -------------------------------------------------------------------------------- Physical Exam Details Patient Name: Ralph Peterson. Date of Service: 11/16/2020 11:00 AM Medical Record Number: [id number] Patient Account Number: [id number] Date of Birth/Sex: 1965/10/21 (55 y.o. M) Treating RN: Dolan Amen Primary Care Provider: Vidal Schwalbe Other Clinician: Jeanine Luz Referring Provider: Vidal Schwalbe Treating Provider/Extender: Tito Dine in Treatment: 91 Constitutional Patient is hypertensive.. Pulse regular and within target range for patient.Marland Kitchen Respirations regular, non-labored and within target range.. Temperature is normal and within the target range for the patient.Marland Kitchen appears in no distress. Notes 4/28; there is thick skin over both areas on the lateral foot but I think both of those are healed. I did not debride these further. o The dorsal foot remains closed except for a small open area on the distal aspect of the wound. This is come in quite a bit there is no longer as much depth and I cannot determine with a skinny that there is any undermining either. Electronic Signature(s) Signed: 11/16/2020 5:07:17 PM By: Linton Ham MD Entered By: Linton Ham on 11/16/2020 12:46:44 Ralph Peterson ([id number]) -------------------------------------------------------------------------------- Physician  Orders Details Patient Name: Ralph Peterson Date of Service: 11/16/2020 11:00 AM Medical Record Number: [id number] Patient Account Number: [id number] Date of Birth/Sex: Dec 12, 1965 (55 y.o. M) Treating RN: Dolan Amen Primary Care Provider: Vidal Schwalbe Other Clinician: Jeanine Luz Referring Provider: Vidal Schwalbe Treating Provider/Extender: Tito Dine in Treatment: 17 Verbal / Phone Orders: No Diagnosis Coding Follow-up Appointments o Return Appointment in 1 week. Bathing/ Shower/ Hygiene o Clean wound with Normal Saline or wound cleanser. Off-Loading o Open toe surgical shoe with peg assist. - Right foot o Other: - Betadine on heel, wear socks for protection Wound Treatment Wound #2R - Foot Wound Laterality: Dorsal, Right, Distal Cleanser: Normal Saline 3 x Per Week/30 Days Discharge Instructions: Wash your hands with soap and water. Remove old dressing, discard into plastic bag and place into trash. Cleanse the wound with Normal  Saline prior to applying a clean dressing using gauze sponges, not tissues or cotton balls. Do not scrub or use excessive force. Pat dry using gauze sponges, not tissue or cotton balls. Primary Dressing: Iodosorb Gel Tube, 10 (g) 3 x Per Week/30 Days Discharge Instructions: Apply to wound bed (extra sent with patient) Secondary Dressing: ABD Pad 5x9 (in/in) 3 x Per Week/30 Days Discharge Instructions: Cover with ABD pad Secured With: 30M Medipore H Soft Cloth Surgical Tape, 2x2 (in/yd) 3 x Per Week/30 Days Discharge Instructions: Secure Kerlix Secured With: [employer] Sterile or Non-Sterile 6-ply 4.5x4 (yd/yd) 3 x Per Week/30 Days Discharge Instructions: Apply Kerlix as directed Secured With: Tubigrip Size E, 3.5x10 (in/yds) 3 x Per Week/30 Days Discharge Instructions: Apply 3 Tubigrip E 3-finger-widths below knee to base of toes to secure dressing and/or for swelling. Electronic Signature(s) Signed: 11/16/2020  5:05:31 PM By: Georges Mouse, Minus Breeding RN Signed: 11/16/2020 5:07:17 PM By: Linton Ham MD Entered By: Georges Mouse, Minus Breeding on 11/16/2020 12:11:14 Ralph Peterson ([id number]) -------------------------------------------------------------------------------- Problem List Details Patient Name: Ralph Peterson Date of Service: 11/16/2020 11:00 AM Medical Record Number: [id number] Patient Account Number: [id number] Date of Birth/Sex: 1966/07/22 (55 y.o. M) Treating RN: Dolan Amen Primary Care Provider: Vidal Schwalbe Other Clinician: Jeanine Luz Referring Provider: Vidal Schwalbe Treating Provider/Extender: Tito Dine in Treatment: 29 Active Problems ICD-10 Encounter Code Description Active Date MDM Diagnosis L89.893 Pressure ulcer of other site, stage 3 04/27/2020 No Yes T81.31XA Disruption of external operation (surgical) wound, not elsewhere 04/27/2020 No Yes classified, initial encounter L97.512 Non-pressure chronic ulcer of other part of right foot with fat layer 04/27/2020 No Yes exposed L97.812 Non-pressure chronic ulcer of other part of right lower leg with fat layer 04/27/2020 No Yes exposed E11.621 Type 2 diabetes mellitus with foot ulcer 04/27/2020 No Yes I50.42 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 M) Treating  RN: Dolan Amen Primary Care Provider: Vidal Schwalbe Other Clinician: Jeanine Luz Referring Provider: Vidal Schwalbe Treating Provider/Extender: Tito Dine in Treatment: 29 Subjective History of Present Illness (HPI) 08/17/2020 upon evaluation today patient actually appears to be doing decently well today which is good news in regard to the dorsal wound which is almost healed and the lateral wound also doing much better. With regard to the plantar wound this is still draining purulent drainage unfortunately the culture did reveal that he had Pseudomonas as well as Enterococcus. With that being said he is allergic to penicillin which took away the only oral medication for the Enterococcus I did look at linezolid as a possibility but unfortunately there were medication interactions. Also the same was true the Cipro where there were medication interactions. Nonetheless I want to see if we can get him into infectious disease to see if they can work with him to try to find possibly add at home IV antibiotic regimen that can help out at this point. No sharp debridement is can be necessary today. 09/07/2020 upon evaluation today patient appears to be doing unfortunately not too well in regard to his wound.  He has been tolerating the dressing changes without complication. Fortunately there is no sign of active infection at this time. No fevers, chills, nausea, vomiting, or diarrhea. The patient did have a repeat surgery going to clean out the abscess and try to work on getting things moving in a better direction about a week and a half ago. He still has sutures in place. With that being said unfortunately he does not appear to be showing signs of a lot of improvement in my opinion. He does have sutures on the plantar aspect still in place and on the dorsal aspect he has a small opening which actually probes all the way down through and actually came out of the plantar aspect. I feel  like that he really is having a lot of issues here and I am afraid that if we do not get this taken care of this is can end up with him having a much larger and more extensive amputation than what he was hoping to have to deal with with this. It has already been since June 2021 but has been dealing with this including all the time since the amputation. 09/14/2020 upon evaluation today patient appears to be doing really about the same in regard to the wound on the dorsal surface of his foot. The plantar foot unfortunately still has sutures in place I really cannot see how things are doing in that regard. With the dorsal foot however this appears to show signs of still having a small opening in the distal aspect which does actually probe down to bone there is also significant purulent drainage we did actually obtain a wound culture today to further evaluate the issue here. 09/21/2020 upon evaluation today patient appears to be doing about the same in regard to his wound on the dorsal surface of his foot and the plantar foot he still does not have the sutures out at this point. That should hopefully happen in the next week. With that being said the main issue is he continues to have drainage here. It was noted that he had positive findings on culture for Enterobacter. With that being said he cannot take oral penicillin he could do vancomycin and I think that is good to be the ideal thing this to see if we can get him started on IV vancomycin at dialysis. We will get a get in touch with them to try to see if we can coordinate that being done there at the dialysis center. I think that is the ideal situation at this point I would probably start with just 2 weeks of therapy. 09/28/2020 upon evaluation today patient appears to be doing well with regard to his wounds in general. He also has back on the vancomycin after the culture results and this seems to be doing excellent for him. All the wounds are showing  signs of good improvement. No fevers, chills, nausea, vomiting, or diarrhea. 10/05/2020 upon evaluation today patient appears to actually be doing excellent in regard to his foot. Fortunately there is a lot of signs of new granulation epithelization at this point. There does not appear to be any evidence of active infection which is great news and overall very pleased with where things stand. In fact the open wound along the plantar aspect of his foot as well as the dorsal part of his foot has dramatically improved since we went forward with the vancomycin and requested that the dialysis center provide this to him by way of IV antibiotics with  his dialysis. Overall I think that has made a dramatic improvement and overall his wound seems to be doing significantly better at this point. No fevers, chills, nausea, vomiting, or diarrhea. The patient was set to have his second opinion appointment with podiatry [facility] next week but to be perfectly honest I am not even certain that that is necessary at this point I do not think there is much that they would be able to do for him nor that he really would want them to do considering how well things look at this time. 10/12/2020 on evaluation today patient appears to be doing excellent in regard to his wounds. In fact the only issue I see is that everything is dried out as far as the collagen is concerned. I do think we can try to keep this moist he will continue to make excellent progress as he has been. Overall I am extremely pleased with what I see today. 10/19/2020 upon evaluation today patient appears to be doing decently well in regard to the wounds on his foot he is can require some debridement in this location. Unfortunately is having some issues with blistering over the leg which is of greater concern at this point. Fortunately I do not see any signs of active infection which is great news. Nonetheless the blistering may just be related to venous  insufficiency or subsequently could potentially be an issue with overall worsening with regard to an infection but again it is not really hot to touch. 10/26/2020 upon evaluation today patient appears to be doing well with regard to the majority of his wounds. Unfortunately the dorsal surface of his foot does not appear to be doing quite as well today compared to what it has been doing. I do think that we will need to address that currently. Everything else is showing signs of improvement. He did not get the prescription that I gave him previous as unfortunately the transmission failed. I did not know that until today when the patient mention to me that he never got the antibiotic. 11/02/20 upon evaluation today patient appears to be doing much better in regard to his wounds in general. I feel like that he is making good progress and even the top of the foot does not appear to be doing too poorly at this time. He did finally get the antibiotics which is good. Unfortunately it does not look like I am able to electronically sent to his pharmacy it kept being an error in transmission every time this was sent. We finally decided to call it in. Nonetheless overall I think he is headed in the right direction based on what I am seeing. 11/09/2020 upon evaluation today patient appears to be doing well in general in regard to his wounds. I think that he is making some progress here. With that being said there is definitely still some areas of concern. Specifically the dorsal aspect of his foot distally and the lateral aspect of his foot proximally. Both of these areas are still open everything else appears to likely be closed today based on what I am seeing. 4/28; the patient really looks quite good. The area over the base of the right fifth metatarsal and the base of the fifth MTP both look healed to me. 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 They are using Hydrofera Blue rope but I think this is  far too small wound now for this Objective Constitutional Patient is hypertensive.. Pulse regular and within target range for patient.Marland Kitchen Respirations regular, non-labored and within target range.. Temperature is normal and within the target range for the patient.Marland Kitchen appears in no distress. Vitals Time Taken: 11:13 AM, Height: 72 in, Weight: 225 lbs, BMI: 30.5, Temperature: 98.3 F, Pulse: 83 bpm, Respiratory Rate: 16 breaths/min, Blood Pressure: 145/86 mmHg. General Notes: 4/28; there is thick skin over both areas on the lateral foot but I think both of those are healed. I did not debride these further. The dorsal foot remains closed except for a small open area on the distal aspect of the wound. This is come in quite a bit there is no longer as much depth and I cannot determine with a skinny that there is any undermining either. Integumentary (Hair, Skin) Wound #2R status is Open. Original cause of wound was Surgical Injury. The date acquired was: 04/20/2020. The wound has been in treatment 29 weeks. The wound is located on the Right,Distal,Dorsal Foot. The wound measures 0.1cm length x 0.1cm width x 0.1cm depth; 0.008cm^2 area and 0.001cm^3 volume. There is Fat Layer (Subcutaneous Tissue) exposed. There is a medium amount of serosanguineous drainage noted. The wound margin is distinct with the outline attached to the wound base. There is large (67-100%) red granulation within the wound bed. There is no necrotic tissue within the wound bed. Wound #8 status is Healed - Epithelialized. Original cause of wound was Gradually Appeared. The date acquired was: 09/07/2020. The wound has been in treatment 7 weeks. The wound is located on the Right,Proximal,Lateral Foot. The wound measures 0cm length x 0cm width x 0cm depth; 0cm^2 area and 0cm^3 volume. There is no tunneling or undermining noted. There is a  none present amount of drainage noted. The wound margin is flat and intact. There is no granulation within the wound bed. There is no necrotic tissue within the wound bed. 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 Bathing/ Shower/ Hygiene: Clean wound with Normal Saline or wound cleanser. Off-Loading: Open toe surgical shoe with peg assist. - Right foot Other: - Betadine on heel, wear socks for protection WOUND #2R: - Foot Wound Laterality: Dorsal, Right, Distal Cleanser: Normal Saline 3 x Per Week/30 Days Discharge Instructions: Wash your hands with soap and water. Remove old dressing, discard into plastic bag and place into trash. Cleanse the wound with Normal Saline prior to applying a clean dressing using gauze sponges, not tissues or cotton balls. Do not scrub or use excessive force. Pat dry using gauze sponges, not tissue or cotton balls. Primary Dressing: Iodosorb Gel Tube, 10 (g) 3 x Per Week/30 Days DEMETREE, BOORAS ([id number]) Discharge Instructions: Apply to wound bed (extra sent with patient) Secondary Dressing: ABD Pad 5x9 (in/in) 3 x Per Week/30 Days Discharge Instructions: Cover with ABD pad Secured With: 65M Medipore H Soft Cloth Surgical Tape, 2x2 (in/yd) 3 x Per Week/30 Days Discharge Instructions: Secure Kerlix Secured With: [employer] Sterile or Non-Sterile 6-ply 4.5x4 (yd/yd) 3 x Per Week/30 Days Discharge Instructions:  Apply Kerlix as  directed Secured With: Tubigrip Size E, 3.5x10 (in/yds) 3 x Per Week/30 Days Discharge Instructions: Apply 3 Tubigrip E 3-finger-widths below knee to base of toes to secure dressing and/or for swelling. 1. I am going to try Iodosorb to the area on the dorsal foot. This is sometimes absorptive enough to pull small wounds together. 2. Endoform is my other thought. This is much too small for Hydrofera Blue rope at this point 3. I think the areas on his right lateral foot are healed he certainly needs to continue to protect of these areas 4. He tells me he has diabetic foot wear with custom insoles. I have asked him to bring this next week. He has had a left TMA but he does not wear a prosthesis in his shoes Electronic Signature(s) Signed: 11/16/2020 5:07:17 PM By: Linton Ham MD Entered By: Linton Ham on 11/16/2020 12:48:02 Ralph Peterson ([id number]) -------------------------------------------------------------------------------- SuperBill Details Patient Name: Ralph Peterson. Date of Service: 11/16/2020 Medical Record Number: [id number] Patient Account Number: [id number] Date of Birth/Sex: 07-Apr-1966 (55 y.o. 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 N18.6 End stage renal disease Z99.2 Dependence on  renal dialysis Facility Procedures CPT4 Code: [id number] Description: 99213 - WOUND CARE VISIT-LEV 3 EST PT Modifier: Quantity: 1 Physician Procedures CPT4 Code: [id number] Description: 99213 - WC PHYS LEVEL 3 - EST PT Modifier: Quantity: 1 CPT4 Code: Description: ICD-10 Diagnosis Description L97.512 Non-pressure chronic ulcer of other part of right foot with fat layer exp T81.31XA Disruption of external operation (surgical) wound, not elsewhere classifi Modifier: osed ed, initial encounter Quantity: Electronic Signature(s) Signed: 11/16/2020 5:07:17 PM By: Linton Ham MD Entered By: Linton Ham on 11/16/2020 12:48:32

## 2020-11-17 NOTE — Progress Notes (Signed)
JAMERIUS, REAVIS ([id number]) Visit Report for 11/16/2020 Arrival Information Details Patient Name: Ralph Peterson, Ralph Peterson. Date of Service: 11/16/2020 11:00 AM Medical Record Number: [id number] Patient Account Number: [id number] Date of Birth/Sex: 04-Jul-1966 (55 y.o. M) Treating RN: Carlene Coria Primary Care Quorra Rosene: Vidal Schwalbe Other Clinician: Jeanine Luz Referring Sharunda Salmon: Vidal Schwalbe Treating Audelia Knape/Extender: Tito Dine in Treatment: 27 Visit Information History Since Last Visit All ordered tests and consults were completed: No Patient Arrived: Wheel Chair Added or deleted any medications: No Arrival Time: 11:09 Any new allergies or adverse reactions: No Accompanied By: son Had a fall or experienced change in No Transfer Assistance: None activities of daily living that may affect Patient Identification Verified: Yes risk of falls: Secondary Verification Process Completed: Yes Signs or symptoms of abuse/neglect since last visito No Patient Requires Transmission-Based Precautions: No Hospitalized since last visit: No Patient Has Alerts: No Implantable device outside of the clinic excluding No cellular tissue based products placed in the center since last visit: Has Dressing in Place as Prescribed: Yes Pain Present Now: No Electronic Signature(s) Signed: 11/17/2020 8:24:32 AM By: Carlene Coria RN Entered By: Carlene Coria on 11/16/2020 11:12:58 Ralph Peterson ([id number]) -------------------------------------------------------------------------------- Clinic Level of Care Assessment Details Patient Name: Ralph Peterson Date of Service: 11/16/2020 11:00 AM Medical Record Number: [id number] Patient Account Number: [id number] Date of Birth/Sex: June 02, 1966 (55 y.o. M) Treating RN: Dolan Amen Primary Care Meredeth Furber: Vidal Schwalbe Other Clinician: Jeanine Luz Referring Tavio Biegel: Vidal Schwalbe Treating Dianelys Scinto/Extender: Tito Dine in Treatment: 29 Clinic Level of Care Assessment Items TOOL 4 Quantity Score X - Use when only an EandM is performed on FOLLOW-UP visit 1 0 ASSESSMENTS - Nursing Assessment / Reassessment X - Reassessment of Co-morbidities (includes updates in patient status) 1 10 X- 1 5 Reassessment of Adherence to Treatment Plan ASSESSMENTS - Wound and Skin Assessment / Reassessment '[]'$  - Simple Wound Assessment / Reassessment - one wound 0 X- 2 5 Complex Wound Assessment / Reassessment - multiple wounds '[]'$  - 0 Dermatologic / Skin Assessment (not related to wound area) ASSESSMENTS - Focused Assessment '[]'$  - Circumferential Edema Measurements - multi extremities 0 '[]'$  - 0 Nutritional Assessment / Counseling / Intervention '[]'$  - 0 Lower Extremity Assessment (monofilament, tuning fork, pulses) '[]'$  - 0 Peripheral Arterial Disease Assessment (using hand held doppler) ASSESSMENTS - Ostomy and/or Continence Assessment and Care '[]'$  - Incontinence Assessment and Management 0 '[]'$  - 0 Ostomy Care Assessment and Management (repouching, etc.) PROCESS - Coordination of Care X - Simple Patient / Family Education for ongoing care 1 15 '[]'$  - 0 Complex (extensive) Patient / Family Education for ongoing care X- 1 10 Staff obtains Programmer, systems, Records, Test Results / Process Orders '[]'$  - 0 Staff telephones HHA, Nursing Homes / Clarify orders / etc '[]'$  - 0 Routine Transfer to another Facility (non-emergent condition) '[]'$  - 0 Routine Hospital Admission (non-emergent condition) '[]'$  - 0 New Admissions / Biomedical engineer / Ordering NPWT, Apligraf, etc. '[]'$  - 0 Emergency Hospital Admission (emergent condition) X- 1 10 Simple Discharge Coordination '[]'$  - 0 Complex (extensive) Discharge Coordination PROCESS - Special Needs '[]'$  - Pediatric / Minor Patient Management 0 '[]'$  - 0 Isolation Patient Management '[]'$  - 0 Hearing / Language / Visual special needs '[]'$  - 0 Assessment of Community assistance  (transportation, D/C planning, etc.) '[]'$  - 0 Additional assistance / Altered mentation '[]'$  - 0 Support Surface(s) Assessment (bed, cushion, seat, etc.) INTERVENTIONS - Wound Cleansing / Measurement Ralph Peterson, Ralph S. ([id number]) '[]'$  -  0 Simple Wound Cleansing - one wound X- 2 5 Complex Wound Cleansing - multiple wounds X- 1 5 Wound Imaging (photographs - any number of wounds) '[]'$  - 0 Wound Tracing (instead of photographs) '[]'$  - 0 Simple Wound Measurement - one wound X- 2 5 Complex Wound Measurement - multiple wounds INTERVENTIONS - Wound Dressings '[]'$  - Small Wound Dressing one or multiple wounds 0 X- 1 15 Medium Wound Dressing one or multiple wounds '[]'$  - 0 Large Wound Dressing one or multiple wounds '[]'$  - 0 Application of Medications - topical '[]'$  - 0 Application of Medications - injection INTERVENTIONS - Miscellaneous '[]'$  - External ear exam 0 '[]'$  - 0 Specimen Collection (cultures, biopsies, blood, body fluids, etc.) '[]'$  - 0 Specimen(s) / Culture(s) sent or taken to Lab for analysis '[]'$  - 0 Patient Transfer (multiple staff / Civil Service fast streamer / Similar devices) '[]'$  - 0 Simple Staple / Suture removal (25 or less) '[]'$  - 0 Complex Staple / Suture removal (26 or more) '[]'$  - 0 Hypo / Hyperglycemic Management (close monitor of Blood Glucose) '[]'$  - 0 Ankle / Brachial Index (ABI) - do not check if billed separately X- 1 5 Vital Signs Has the patient been seen at the hospital within the last three years: Yes Total Score: 105 Level Of Care: New/Established - Level 3 Electronic Signature(s) Signed: 11/16/2020 5:05:31 PM By: Georges Mouse, Minus Breeding RN Entered By: Georges Mouse, Kenia on 11/16/2020 11:57:49 Ralph Peterson ([id number]) -------------------------------------------------------------------------------- Encounter Discharge Information Details Patient Name: Ralph Peterson Date of Service: 11/16/2020 11:00 AM Medical Record Number: [id number] Patient Account  Number: [id number] Date of Birth/Sex: 09/28/1965 (55 y.o. M) Treating RN: Dolan Amen Primary Care Wadell Craddock: Vidal Schwalbe Other Clinician: Jeanine Luz Referring Christine Schiefelbein: Vidal Schwalbe Treating Shay Jhaveri/Extender: Tito Dine in Treatment: 37 Encounter Discharge Information Items Discharge Condition: Stable Ambulatory Status: Wheelchair Discharge Destination: Home Transportation: Private Auto Accompanied By: son Schedule Follow-up Appointment: Yes Clinical Summary of Care: Electronic Signature(s) Signed: 11/16/2020 4:35:13 PM By: Jeanine Luz Entered By: Jeanine Luz on 11/16/2020 12:13:29 Ralph Peterson ([id number]) -------------------------------------------------------------------------------- Lower Extremity Assessment Details Patient Name: Ralph Peterson. Date of Service: 11/16/2020 11:00 AM Medical Record Number: [id number] Patient Account Number: [id number] Date of Birth/Sex: 19-Oct-1965 (55 y.o. M) Treating RN: Carlene Coria Primary Care Pattiann Solanki: Vidal Schwalbe Other Clinician: Jeanine Luz Referring Adline Kirshenbaum: Vidal Schwalbe Treating Lynel Forester/Extender: Ricard Dillon Weeks in Treatment: 29 Vascular Assessment Pulses: Dorsalis Pedis Palpable: [Right:Yes] Electronic Signature(s) Signed: 11/17/2020 8:24:32 AM By: Carlene Coria RN Entered By: Carlene Coria on 11/16/2020 11:26:19 Ralph Peterson ([id number]) -------------------------------------------------------------------------------- Multi Wound Chart Details Patient Name: Ralph Peterson Date of Service: 11/16/2020 11:00 AM Medical Record Number: [id number] Patient Account Number: [id number] Date of Birth/Sex: 04-Apr-1966 (55 y.o. M) Treating RN: Dolan Amen Primary Care Lavalle Skoda: Vidal Schwalbe Other Clinician: Jeanine Luz Referring Collin Rengel: Vidal Schwalbe Treating Yarden Hillis/Extender: Tito Dine in Treatment: 29 Vital Signs Height(in):  72 Pulse(bpm): 38 Weight(lbs): 225 Blood Pressure(mmHg): 145/86 Body Mass Index(BMI): 31 Temperature(F): 98.3 Respiratory Rate(breaths/min): 16 Photos: [N/A:N/A] Wound Location: Right, Distal, Dorsal Foot Right, Proximal, Lateral Foot N/A Wounding Event: Surgical Injury Gradually Appeared N/A Primary Etiology: Open Surgical Wound Pressure Ulcer N/A Comorbid History: Arrhythmia, Congestive Heart Arrhythmia, Congestive Heart N/A Failure, Coronary Artery Disease, Failure, Coronary Artery Disease, Hypertension, Myocardial Infarction, Hypertension, Myocardial Infarction, Peripheral Venous Disease, Type II Peripheral Venous Disease, Type II Diabetes, Neuropathy Diabetes, Neuropathy Date Acquired: 04/20/2020 09/07/2020 N/A Weeks of Treatment: 29 7 N/A Wound Status: Open Healed - Epithelialized  N/A Wound Recurrence: Yes No N/A Measurements L x W x D (cm) 0.1x0.1x0.1 0x0x0 N/A Area (cm) : 0.008 0 N/A Volume (cm) : 0.001 0 N/A % Reduction in Area: 99.80% 100.00% N/A % Reduction in Volume: 99.90% 100.00% N/A Classification: Full Thickness Without Exposed Unstageable/Unclassified N/A Support Structures Exudate Amount: Medium None Present N/A Exudate Type: Serosanguineous N/A N/A Exudate Color: red, brown N/A N/A Wound Margin: Distinct, outline attached Flat and Intact N/A Granulation Amount: Large (67-100%) None Present (0%) N/A Granulation Quality: Red N/A N/A Necrotic Amount: None Present (0%) None Present (0%) N/A Exposed Structures: Fat Layer (Subcutaneous Tissue): Fascia: No N/A Yes Fat Layer (Subcutaneous Tissue): Fascia: No No Tendon: No Tendon: No Muscle: No Muscle: No Joint: No Joint: No Bone: No Bone: No Epithelialization: Medium (34-66%) None N/A Treatment Notes Wound #2R (Foot) Wound Laterality: Dorsal, Right, Distal Cleanser Normal Saline Ralph Peterson, Ralph Peterson ([id number]) Discharge Instruction: Wash your hands with soap and water. Remove old dressing, discard  into plastic bag and place into trash. Cleanse the wound with Normal Saline prior to applying a clean dressing using gauze sponges, not tissues or cotton balls. Do not scrub or use excessive force. Pat dry using gauze sponges, not tissue or cotton balls. Peri-Wound Care Topical Primary Dressing Iodosorb Gel Tube, 10 (g) Discharge Instruction: Apply to wound bed (extra sent with patient) Secondary Dressing ABD Pad 5x9 (in/in) Discharge Instruction: Cover with ABD pad Secured With 43M Medipore H Soft Cloth Surgical Tape, 2x2 (in/yd) Discharge Instruction: Secure Kerlix Kerlix Roll Sterile or Non-Sterile 6-ply 4.5x4 (yd/yd) Discharge Instruction: Apply Kerlix as directed Tubigrip Size E, 3.5x10 (in/yds) Discharge Instruction: Apply 3 Tubigrip E 3-finger-widths below knee to base of toes to secure dressing and/or for swelling. Compression Wrap Compression Stockings Add-Ons Electronic Signature(s) Signed: 11/16/2020 5:07:17 PM By: Linton Ham MD Entered By: Linton Ham on 11/16/2020 12:43:51 Ralph Peterson ([id number]) -------------------------------------------------------------------------------- [employer] Details Patient Name: Ralph Peterson Date of Service: 11/16/2020 11:00 AM Medical Record Number: [id number] Patient Account Number: [id number] Date of Birth/Sex: July 20, 1966 (55 y.o. M) Treating RN: Dolan Amen Primary Care Chloie Loney: Vidal Schwalbe Other Clinician: Jeanine Luz Referring Fatou Dunnigan: Vidal Schwalbe Treating Itzayanna Kaster/Extender: Tito Dine in Treatment: 29 Active Inactive Necrotic Tissue Nursing Diagnoses: Impaired tissue integrity related to necrotic/devitalized tissue Knowledge deficit related to management of necrotic/devitalized tissue Goals: Necrotic/devitalized tissue will be minimized in the wound bed Date Initiated: 05/09/2020 Target Resolution Date: 05/09/2020 Goal Status:  Active Interventions: Assess patient pain level pre-, during and post procedure and prior to discharge Provide education on necrotic tissue and debridement process Treatment Activities: Enzymatic debridement : 05/09/2020 Notes: Electronic Signature(s) Signed: 11/16/2020 5:05:31 PM By: Georges Mouse, Minus Breeding RN Entered By: Georges Mouse, Kenia on 11/16/2020 11:56:12 Ralph Peterson ([id number]) -------------------------------------------------------------------------------- Pain Assessment Details Patient Name: Ralph Peterson. Date of Service: 11/16/2020 11:00 AM Medical Record Number: [id number] Patient Account Number: [id number] Date of Birth/Sex: 1965-12-27 (55 y.o. M) Treating RN: Carlene Coria Primary Care Bea Duren: Vidal Schwalbe Other Clinician: Jeanine Luz Referring Stefano Trulson: Vidal Schwalbe Treating Harriet Sutphen/Extender: Tito Dine in Treatment: 29 Active Problems Location of Pain Severity and Description of Pain Patient Has Paino No Site Locations Pain Management and Medication Current Pain Management: Electronic Signature(s) Signed: 11/17/2020 8:24:32 AM By: Carlene Coria RN Entered By: Carlene Coria on 11/16/2020 11:13:25 Ralph Peterson ([id number]) -------------------------------------------------------------------------------- Patient/Caregiver Education Details Patient Name: Ralph Peterson Date of Service: 11/16/2020 11:00 AM Medical Record Number: [id number] Patient Account Number: [id number] Date of Birth/Gender:  18-Jan-1966 (55 y.o. M) Treating RN: Dolan Amen Primary Care Physician: Vidal Schwalbe Other Clinician: Jeanine Luz Referring Physician: Vidal Schwalbe Treating Physician/Extender: Tito Dine in Treatment: 39 Education Assessment Education Provided To: Patient Education Topics Provided Wound/Skin Impairment: Methods: Explain/Verbal Responses: State content correctly Electronic  Signature(s) Signed: 11/16/2020 5:05:31 PM By: Georges Mouse, Minus Breeding RN Entered By: Georges Mouse, Minus Breeding on 11/16/2020 11:58:04 Ralph Peterson ([id number]) -------------------------------------------------------------------------------- Wound Assessment Details Patient Name: Ralph Peterson Date of Service: 11/16/2020 11:00 AM Medical Record Number: [id number] Patient Account Number: [id number] Date of Birth/Sex: July 13, 1966 (55 y.o. M) Treating RN: Dolan Amen Primary Care Arlayne Liggins: Vidal Schwalbe Other Clinician: Jeanine Luz Referring Latavia Goga: Vidal Schwalbe Treating Suha Schoenbeck/Extender: Tito Dine in Treatment: 29 Wound Status Wound Number: 2R Primary Open Surgical Wound Etiology: Wound Location: Right, Distal, Dorsal Foot Wound Open Wounding Event: Surgical Injury Status: Date Acquired: 04/20/2020 Comorbid Arrhythmia, Congestive Heart Failure, Coronary Artery Weeks Of Treatment: 29 History: Disease, Hypertension, Myocardial Infarction, Peripheral Clustered Wound: No Venous Disease, Type II Diabetes, Neuropathy Photos Wound Measurements Length: (cm) 0.1 Width: (cm) 0.1 Depth: (cm) 0.1 Area: (cm) 0.008 Volume: (cm) 0.001 % Reduction in Area: 99.8% % Reduction in Volume: 99.9% Epithelialization: Medium (34-66%) Wound Description Classification: Full Thickness Without Exposed Support Structures Wound Margin: Distinct, outline attached Exudate Amount: Medium Exudate Type: Serosanguineous Exudate Color: red, brown Foul Odor After Cleansing: No Slough/Fibrino No Wound Bed Granulation Amount: Large (67-100%) Exposed Structure Granulation Quality: Red Fascia Exposed: No Necrotic Amount: None Present (0%) Fat Layer (Subcutaneous Tissue) Exposed: Yes Tendon Exposed: No Muscle Exposed: No Joint Exposed: No Bone Exposed: No Treatment Notes Wound #2R (Foot) Wound Laterality: Dorsal, Right, Distal Cleanser Normal Saline Discharge  Instruction: Wash your hands with soap and water. Remove old dressing, discard into plastic bag and place into trash. Cleanse the wound with Normal Saline prior to applying a clean dressing using gauze sponges, not tissues or cotton balls. Do not [place], Ralph S. ([id number]) scrub or use excessive force. Pat dry using gauze sponges, not tissue or cotton balls. Peri-Wound Care Topical Primary Dressing Iodosorb Gel Tube, 10 (g) Discharge Instruction: Apply to wound bed (extra sent with patient) Secondary Dressing ABD Pad 5x9 (in/in) Discharge Instruction: Cover with ABD pad Secured With 34M Medipore H Soft Cloth Surgical Tape, 2x2 (in/yd) Discharge Instruction: Secure Kerlix Kerlix Roll Sterile or Non-Sterile 6-ply 4.5x4 (yd/yd) Discharge Instruction: Apply Kerlix as directed Tubigrip Size E, 3.5x10 (in/yds) Discharge Instruction: Apply 3 Tubigrip E 3-finger-widths below knee to base of toes to secure dressing and/or for swelling. Compression Wrap Compression Stockings Add-Ons Electronic Signature(s) Signed: 11/16/2020 5:05:31 PM By: Georges Mouse, Minus Breeding RN Signed: 11/17/2020 8:24:32 AM By: Carlene Coria RN Entered By: Carlene Coria on 11/16/2020 11:57:21 Ralph Peterson ([id number]) -------------------------------------------------------------------------------- Wound Assessment Details Patient Name: Ralph Peterson. Date of Service: 11/16/2020 11:00 AM Medical Record Number: [id number] Patient Account Number: [id number] Date of Birth/Sex: 09/01/1965 (55 y.o. M) Treating RN: Dolan Amen Primary Care Clarissa Laird: Vidal Schwalbe Other Clinician: Jeanine Luz Referring Mordechai Matuszak: Vidal Schwalbe Treating Myrikal Messmer/Extender: Tito Dine in Treatment: 29 Wound Status Wound Number: 8 Primary Pressure Ulcer Etiology: Wound Location: Right, Proximal, Lateral Foot Wound Healed - Epithelialized Wounding Event: Gradually Appeared Status: Date Acquired:  09/07/2020 Comorbid Arrhythmia, Congestive Heart Failure, Coronary Artery Weeks Of Treatment: 7 History: Disease, Hypertension, Myocardial Infarction, Peripheral Clustered Wound: No Venous Disease, Type II Diabetes, Neuropathy Photos Wound Measurements Length: (cm) 0 Width: (cm) 0 Depth: (cm) 0 Area: (cm) 0 Volume: (cm)  0 % Reduction in Area: 100% % Reduction in Volume: 100% Epithelialization: None Tunneling: No Undermining: No Wound Description Classification: Unstageable/Unclassified Wound Margin: Flat and Intact Exudate Amount: None Present Foul Odor After Cleansing: No Slough/Fibrino No Wound Bed Granulation Amount: None Present (0%) Exposed Structure Necrotic Amount: None Present (0%) Fascia Exposed: No Fat Layer (Subcutaneous Tissue) Exposed: No Tendon Exposed: No Muscle Exposed: No Joint Exposed: No Bone Exposed: No Electronic Signature(s) Signed: 11/16/2020 12:10:37 PM By: Georges Mouse, Minus Breeding RN Entered By: Georges Mouse, Kenia on 11/16/2020 12:10:37 Ralph Peterson ([id number]) -------------------------------------------------------------------------------- [place] Details Patient Name: Ralph Peterson Date of Service: 11/16/2020 11:00 AM Medical Record Number: [id number] Patient Account Number: [id number] Date of Birth/Sex: [birth year] (55 y.o. M) Treating RN: Carlene Coria Primary Care Darrold Bezek: Vidal Schwalbe Other Clinician: Jeanine Luz Referring Trey Bebee: Vidal Schwalbe Treating Normagene Harvie/Extender: Tito Dine in Treatment: 29 Vital Signs Time Taken: 11:13 Temperature (F): 98.3 Height (in): 72 Pulse (bpm): 83 Weight (lbs): 225 Respiratory Rate (breaths/min): 16 Body Mass Index (BMI): 30.5 Blood Pressure (mmHg): 145/86 Reference Range: 80 - 120 mg / dl Electronic Signature(s) Signed: 11/17/2020 8:24:32 AM By: Carlene Coria RN Entered By: Carlene Coria on 11/16/2020 11:13:18

## 2020-11-23 ENCOUNTER — Encounter: Payer: Medicare PPO | Attending: Physician Assistant | Admitting: Physician Assistant

## 2020-11-23 ENCOUNTER — Other Ambulatory Visit: Payer: Self-pay

## 2020-11-23 DIAGNOSIS — N186 End stage renal disease: Secondary | ICD-10-CM | POA: Insufficient documentation

## 2020-11-23 DIAGNOSIS — E11621 Type 2 diabetes mellitus with foot ulcer: Secondary | ICD-10-CM | POA: Diagnosis present

## 2020-11-23 DIAGNOSIS — E1151 Type 2 diabetes mellitus with diabetic peripheral angiopathy without gangrene: Secondary | ICD-10-CM | POA: Diagnosis not present

## 2020-11-23 DIAGNOSIS — E1140 Type 2 diabetes mellitus with diabetic neuropathy, unspecified: Secondary | ICD-10-CM | POA: Insufficient documentation

## 2020-11-23 DIAGNOSIS — E1122 Type 2 diabetes mellitus with diabetic chronic kidney disease: Secondary | ICD-10-CM | POA: Diagnosis not present

## 2020-11-23 DIAGNOSIS — L97512 Non-pressure chronic ulcer of other part of right foot with fat layer exposed: Secondary | ICD-10-CM | POA: Diagnosis not present

## 2020-11-23 DIAGNOSIS — I5042 Chronic combined systolic (congestive) and diastolic (congestive) heart failure: Secondary | ICD-10-CM | POA: Diagnosis not present

## 2020-11-23 DIAGNOSIS — I2510 Atherosclerotic heart disease of native coronary artery without angina pectoris: Secondary | ICD-10-CM | POA: Diagnosis not present

## 2020-11-23 DIAGNOSIS — Z992 Dependence on renal dialysis: Secondary | ICD-10-CM | POA: Diagnosis not present

## 2020-11-23 DIAGNOSIS — L89893 Pressure ulcer of other site, stage 3: Secondary | ICD-10-CM | POA: Diagnosis not present

## 2020-11-23 DIAGNOSIS — I132 Hypertensive heart and chronic kidney disease with heart failure and with stage 5 chronic kidney disease, or end stage renal disease: Secondary | ICD-10-CM | POA: Diagnosis not present

## 2020-11-23 DIAGNOSIS — L97812 Non-pressure chronic ulcer of other part of right lower leg with fat layer exposed: Secondary | ICD-10-CM | POA: Insufficient documentation

## 2020-11-23 NOTE — Progress Notes (Addendum)
Ralph Peterson, Ralph Peterson ([id number]) Visit Report for 11/23/2020 Chief Complaint Document Details Patient Name: Ralph Peterson, Ralph Peterson. Date of Service: 11/23/2020 12:45 PM Medical Record Number: [id number] Patient Account Number: [id number] Date of Birth/Sex: 09-16-1965 (55 y.o. 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 M) Treating RN: Dolan Amen Primary Care Provider: Vidal Schwalbe Other Clinician: Referring Provider: Vidal Schwalbe Treating Provider/Extender: Skipper Cliche in Treatment: 30 History of Present Illness HPI Description: 08/17/2020 upon evaluation today patient actually appears to be doing decently well today which is good news in regard to the dorsal wound which is almost healed and the lateral wound also doing much better. With regard to the plantar wound this is still draining purulent drainage unfortunately the culture did reveal that he had Pseudomonas as well as Enterococcus. With that being said he is allergic to penicillin which took away the only oral medication for the Enterococcus I did look at linezolid as a possibility but unfortunately there were medication interactions. Also the same was true the Cipro where there were medication interactions.  Nonetheless I want to see if we can get him into infectious disease to see if they can work with him to try to find possibly add at home IV antibiotic regimen that can help out at this point. No sharp debridement is can be necessary today. 09/07/2020 upon evaluation today patient appears to be doing unfortunately not too well in regard to his wound. He has been tolerating the dressing changes without complication. Fortunately there is no sign of active infection at this time. No fevers, chills, nausea, vomiting, or diarrhea. The patient did have a repeat surgery going to clean out the abscess and try to work on getting things moving in a better direction about a week and a half ago. He still has sutures in place. With that being said unfortunately he does not appear to be showing signs of a lot of improvement in my opinion. He does have sutures on the plantar aspect still in place and on the dorsal aspect he has a small opening which actually probes all the way down through and actually came out of the plantar aspect. I feel like that he really is having a lot of issues here and I am afraid that if we do not get this taken care of this is can end up with him having a much larger and more extensive amputation than what he was hoping to have to deal with with this. It has already been since June 2021 but has been dealing with this including all the time since the amputation. 09/14/2020 upon evaluation today patient appears to be doing really about the same in regard to the wound on the dorsal surface of his foot. The plantar foot unfortunately still has sutures in place I really cannot see how things are doing in that regard. With the dorsal foot however this appears to show signs of still having a small opening in the distal aspect which does actually probe down to  bone there is also significant purulent drainage we did actually obtain a wound culture today to further evaluate the issue here. 09/21/2020  upon evaluation today patient appears to be doing about the same in regard to his wound on the dorsal surface of his foot and the plantar foot he still does not have the sutures out at this point. That should hopefully happen in the next week. With that being said the main issue is he continues to have drainage here. It was noted that he had positive findings on culture for Enterobacter. With that being said he cannot take oral penicillin he could do vancomycin and I think that is good to be the ideal thing this to see if we can get him started on IV vancomycin at dialysis. We will get a get in touch with them to try to see if we can coordinate that being done there at the dialysis center. I think that is the ideal situation at this point I would probably start with just 2 weeks of therapy. 09/28/2020 upon evaluation today patient appears to be doing well with regard to his wounds in general. He also has back on the vancomycin after the culture results and this seems to be doing excellent for him. All the wounds are showing signs of good improvement. No fevers, chills, nausea, vomiting, or diarrhea. 10/05/2020 upon evaluation today patient appears to actually be doing excellent in regard to his foot. Fortunately there is a lot of signs of new granulation epithelization at this point. There does not appear to be any evidence of active infection which is great news and overall very pleased with where things stand. In fact the open wound along the plantar aspect of his foot as well as the dorsal part of his foot has dramatically improved since we went forward with the vancomycin and requested that the dialysis center provide this to him by way of IV antibiotics with his dialysis. Overall I think that has made a dramatic improvement and overall his wound seems to be doing significantly better at this point. No fevers, chills, nausea, vomiting, or diarrhea. The patient was set to have his second opinion  appointment with podiatry [facility] next week but to be perfectly honest I am not even certain that that is necessary at this point I do not think there is much that they would be able to do for him nor that he really would want them to do considering how well things look at this time. 10/12/2020 on evaluation today patient appears to be doing excellent in regard to his wounds. In fact the only issue I see is that everything is dried out as far as the collagen is concerned. I do think we can try to keep this moist he will continue to make excellent progress as he has been. Overall I am extremely pleased with what I see today. 10/19/2020 upon evaluation today patient appears to be doing decently well in regard to the wounds on his foot he is can require some debridement in this location. Unfortunately is having some issues with blistering over the leg which is of greater concern at this point. Fortunately I do not see any signs of active infection which is great news. Nonetheless the blistering may just be related to venous insufficiency or subsequently could potentially be an issue with overall worsening with regard to an infection but again it is not really hot to touch. 10/26/2020 upon evaluation today patient appears to be doing well with regard  to the majority of his wounds. Unfortunately the dorsal surface of his foot does not appear to be doing quite as well today compared to what it has been doing. I do think that we will need to address that currently. Everything else is showing signs of improvement. He did not get the prescription that I gave him previous as unfortunately the transmission failed. I did not know that until today when the patient mention to me that he never got the antibiotic. 11/02/20 upon evaluation today patient appears to be doing much better in regard to his wounds in general. I feel like that he is making good progress and even the top of the foot does not appear to be doing too  poorly at this time. He did finally get the antibiotics which is good. Unfortunately it does not look like I am able to electronically sent to his pharmacy it kept being an error in transmission every time this was sent. We finally decided to call it in. Nonetheless overall I think he is headed in the right direction based on what I am seeing. 11/09/2020 upon evaluation today patient appears to be doing well in general in regard to his wounds. I think that he is making some progress here. With that being said there is definitely still some areas of concern. Specifically the dorsal aspect of his foot distally and the lateral aspect of his foot proximally. Both of these areas are still open everything else appears to likely be closed today based on what I am seeing. 4/28; the patient really looks quite good. The area over the base of the right fifth metatarsal and the base of the fifth MTP both look healed to me. 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 They are using Hydrofera Blue rope but I think this is far too small wound now for this 11/23/2020 upon evaluation today patient actually appears to be doing excellent. In fact I do not really see anything that is actually open anywhere at this point. With that being said I am a little reluctant to completely just healing out and discharge him based on the dorsal foot wound being closed even though I do not see anything open we have been there down this road before where things would reopen causing additional problems. Nonetheless I think that we need to monitor him a little bit more closely to make sure nothing worsens again. Electronic Signature(s) Signed: 11/23/2020 1:47:41 PM By: Worthy Keeler PA-C Entered By: Worthy Keeler on 11/23/2020 13:47:40 Ralph Peterson  ([id number]) -------------------------------------------------------------------------------- Physical Exam Details Patient Name: Ralph Peterson Date of Service: 11/23/2020 12:45 PM Medical Record Number: [id number] Patient Account Number: [id number] Date of Birth/Sex: 01/07/1966 (55 y.o. M) Treating RN: Dolan Amen Primary Care Provider: Vidal Schwalbe Other Clinician: Referring Provider: Vidal Schwalbe Treating Provider/Extender: Skipper Cliche in Treatment: 46 Constitutional Well-nourished and well-hydrated in no acute distress. Respiratory normal breathing without difficulty. Psychiatric this patient is able to make decisions and demonstrates good insight into disease process. Alert and Oriented x 3. pleasant and cooperative. Notes Upon inspection patient's wounds actually appear to all be healed which is great news including the dorsal foot. With that being said I am somewhat concerned about the fact that in the past he has had some issues to be honest where this would close then reopens I want to keep a close  eye on things I plan to see him back in 1 week to make sure nothing is reopened I am also can let him start walking a little bit between now and then in his custom shoe Electronic Signature(s) Signed: 11/23/2020 3:06:55 PM By: Worthy Keeler PA-C Entered By: Worthy Keeler on 11/23/2020 15:06:55 Ralph Peterson ([id number]) -------------------------------------------------------------------------------- Physician Orders Details Patient Name: Ralph Peterson Date of Service: 11/23/2020 12:45 PM Medical Record Number: [id number] Patient Account Number: [id number] Date of Birth/Sex: 22-Oct-1965 (55 y.o. 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 Edema Control - Lymphedema / Segmental Compressive Device / Other o Tubigrip single layer applied. - Tubi E-gauze on new epithelialized areas secured with paper tape Additional Orders / Instructions o Other: - Patient to wear own diabetic shoe Electronic Signature(s) Signed: 11/23/2020 5:11:22 PM By: Georges Mouse, Minus Breeding RN Signed: 11/24/2020 6:22:42 PM By: Worthy Keeler PA-C Entered By: Georges Mouse, Minus Breeding on 11/23/2020 13:41:21 Ralph Peterson ([id number]) -------------------------------------------------------------------------------- Problem List Details Patient Name: Ralph Peterson. Date of Service: 11/23/2020 12:45 PM Medical Record Number: [id number] Patient Account Number: [id number] Date of Birth/Sex: 1966/03/03 (55 y.o. M) Treating RN: Dolan Amen Primary Care Provider: Vidal Schwalbe Other Clinician: Referring Provider: Vidal Schwalbe Treating Provider/Extender: Skipper Cliche in Treatment: 30 Active Problems ICD-10 Encounter Code Description Active Date MDM Diagnosis L89.893 Pressure ulcer of other site, stage 3 04/27/2020 No Yes T81.31XA Disruption of external operation (surgical) wound, not elsewhere 04/27/2020 No Yes classified, initial encounter L97.512 Non-pressure chronic ulcer of other part of right foot with fat layer 04/27/2020 No Yes exposed L97.812  Non-pressure chronic ulcer of other part of right lower leg with fat layer 04/27/2020 No Yes exposed E11.621 Type 2 diabetes mellitus with foot ulcer 04/27/2020 No Yes I50.42 Chronic combined systolic (congestive) and diastolic (congestive) heart 04/27/2020 No Yes failure N18.6 End stage renal disease 04/27/2020 No Yes Z99.2 Dependence on renal dialysis 04/27/2020 No Yes Inactive Problems Resolved Problems Electronic Signature(s) Signed: 11/23/2020 12:53:02 PM By: Worthy Keeler PA-C Entered By: Worthy Keeler on 11/23/2020 12:53:02 Ralph Peterson ([id number]) -------------------------------------------------------------------------------- Progress Note Details Patient Name: Ralph Peterson Date of Service: 11/23/2020 12:45 PM Medical Record Number: [id number] Patient Account Number: [id number] Date of Birth/Sex: 1966-05-17 (55 y.o. 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 With regard to the plantar wound this is still draining purulent drainage unfortunately the  culture did reveal that he had Pseudomonas as well as Enterococcus. With that being said he is allergic to penicillin which took away the only oral medication for the Enterococcus I did look at linezolid as a possibility but unfortunately there were medication interactions. Also the same was true the Cipro where there were medication interactions. Nonetheless I want to see if we can get him into  infectious disease to see if they can work with him to try to find possibly add at home IV antibiotic regimen that can help out at this point. No sharp debridement is can be necessary today. 09/07/2020 upon evaluation today patient appears to be doing unfortunately not too well in regard to his wound. He has been tolerating the dressing changes without complication. Fortunately there is no sign of active infection at this time. No fevers, chills, nausea, vomiting, or diarrhea. The patient did have a repeat surgery going to clean out the abscess and try to work on getting things moving in a better direction about a week and a half ago. He still has sutures in place. With that being said unfortunately he does not appear to be showing signs of a lot of improvement in my opinion. He does have sutures on the plantar aspect still in place and on the dorsal aspect he has a small opening which actually probes all the way down through and actually came out of the plantar aspect. I feel like that he really is having a lot of issues here and I am afraid that if we do not get this taken care of this is can end up with him having a much larger and more extensive amputation than what he was hoping to have to deal with with this. It has already been since June 2021 but has been dealing with this including all the time since the amputation. 09/14/2020 upon evaluation today patient appears to be doing really about the same in regard to the wound on the dorsal surface of his foot. The plantar foot unfortunately still has sutures in place I really cannot see how things are doing in that regard. With the dorsal foot however this appears to show signs of still having a small opening in the distal aspect which does actually probe down to bone there is also significant purulent drainage we did actually obtain a wound culture today to further evaluate the issue here. 09/21/2020 upon evaluation today patient appears to be doing  about the same in regard to his wound on the dorsal surface of his foot and the plantar foot he still does not have the sutures out at this point. That should hopefully happen in the next week. With that being said the main issue is he continues to have drainage here. It was noted that he had positive findings on culture for Enterobacter. With that being said he cannot take oral penicillin he could do vancomycin and I think that is good to be the ideal thing this to see if we can get him started on IV vancomycin at dialysis. We will get a get in touch with them to try to see if we can coordinate that being done there at the dialysis center. I think that is the ideal situation at this point I would probably start with just 2 weeks of therapy. 09/28/2020 upon evaluation today patient appears to be doing well with regard to his wounds in general. He also has back on the vancomycin after the culture results and this seems to  be doing excellent for him. All the wounds are showing signs of good improvement. No fevers, chills, nausea, vomiting, or diarrhea. 10/05/2020 upon evaluation today patient appears to actually be doing excellent in regard to his foot. Fortunately there is a lot of signs of new granulation epithelization at this point. There does not appear to be any evidence of active infection which is great news and overall very pleased with where things stand. In fact the open wound along the plantar aspect of his foot as well as the dorsal part of his foot has dramatically improved since we went forward with the vancomycin and requested that the dialysis center provide this to him by way of IV antibiotics with his dialysis. Overall I think that has made a dramatic improvement and overall his wound seems to be doing significantly better at this point. No fevers, chills, nausea, vomiting, or diarrhea. The patient was set to have his second opinion appointment with podiatry [facility] next week but to  be perfectly honest I am not even certain that that is necessary at this point I do not think there is much that they would be able to do for him nor that he really would want them to do considering how well things look at this time. 10/12/2020 on evaluation today patient appears to be doing excellent in regard to his wounds. In fact the only issue I see is that everything is dried out as far as the collagen is concerned. I do think we can try to keep this moist he will continue to make excellent progress as he has been. Overall I am extremely pleased with what I see today. 10/19/2020 upon evaluation today patient appears to be doing decently well in regard to the wounds on his foot he is can require some debridement in this location. Unfortunately is having some issues with blistering over the leg which is of greater concern at this point. Fortunately I do not see any signs of active infection which is great news. Nonetheless the blistering may just be related to venous insufficiency or subsequently could potentially be an issue with overall worsening with regard to an infection but again it is not really hot to touch. 10/26/2020 upon evaluation today patient appears to be doing well with regard to the majority of his wounds. Unfortunately the dorsal surface of his foot does not appear to be doing quite as well today compared to what it has been doing. I do think that we will need to address that currently. Everything else is showing signs of improvement. He did not get the prescription that I gave him previous as unfortunately the transmission failed. I did not know that until today when the patient mention to me that he never got the antibiotic. 11/02/20 upon evaluation today patient appears to be doing much better in regard to his wounds in general. I feel like that he is making good progress and even the top of the foot does not appear to be doing too poorly at this time. He did finally get the  antibiotics which is good. Unfortunately it does not look like I am able to electronically sent to his pharmacy it kept being an error in transmission every time this was sent. We finally decided to call it in. Nonetheless overall I think he is headed in the right direction based on what I am seeing. Ralph Peterson, Ralph Peterson ([id number]) 11/09/2020 upon evaluation today patient appears to be doing well in general in regard to his wounds.  I think that he is making some progress here. With that being said there is definitely still some areas of concern. Specifically the dorsal aspect of his foot distally and the lateral aspect of his foot proximally. Both of these areas are still open everything else appears to likely be closed today based on what I am seeing. 4/28; the patient really looks quite good. The area over the base of the right fifth metatarsal and the base of the fifth MTP both look healed to me. 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 They are using Hydrofera Blue rope but I think this is far too small wound now for this 11/23/2020 upon evaluation today patient actually appears to be doing excellent. In fact I do not really see anything that is actually open anywhere at this point. With that being said I am a little reluctant to completely just healing out and discharge him based on the dorsal foot wound being closed even though I do not see anything open we have been there down this road before where things would reopen causing additional problems. Nonetheless I think that we need to monitor him a little bit more closely to make sure nothing worsens again. Objective Constitutional Well-nourished and well-hydrated in no acute distress. Vitals Time Taken: 1:14 PM, Height: 72 in, Weight: 225 lbs, BMI: 30.5, Temperature: 98.1 F, Pulse: 79 bpm, Respiratory Rate: 18 breaths/min, Blood Pressure: 120/75  mmHg. Respiratory normal breathing without difficulty. Psychiatric this patient is able to make decisions and demonstrates good insight into disease process. Alert and Oriented x 3. pleasant and cooperative. General Notes: Upon inspection patient's wounds actually appear to all be healed which is great news including the dorsal foot. With that being said I am somewhat concerned about the fact that in the past he has had some issues to be honest where this would close then reopens I want to keep a close eye on things I plan to see him back in 1 week to make sure nothing is reopened I am also can let him start walking a little bit between now and then in his custom shoe Integumentary (Hair, Skin) Wound #2R status is Healed - Epithelialized. Original cause of wound was Surgical Injury. The date acquired was: 04/20/2020. The wound has been in treatment 30 weeks. The wound is located on the Right,Distal,Dorsal Foot. The wound measures 0cm length x 0cm width x 0cm depth; 0cm^2 area and 0cm^3 volume. There is no tunneling or undermining noted. There is a none present amount of drainage noted. The wound margin is distinct with the outline attached to the wound base. There is no granulation within the wound bed. There is no necrotic tissue within the wound bed. 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 Ralph Peterson, Ralph Peterson ([id number]) Edema Control - Lymphedema / Segmental Compressive  Device / Other: Tubigrip single layer applied. - Tubi E-gauze on new epithelialized areas secured with paper tape Additional Orders / Instructions: Other: - Patient to wear  own diabetic shoe 1. Would recommend currently that we going continue with the wound care measures as before and the patient is in agreement the plan that includes the use of just protective gauze dressings for padding over all areas in order to make sure nothing hopefully reopens. 2. We will also use Tubigrip just to hold everything in place and keep the swelling down. 3. He will get into his custom shoe at this point as well. We will see patient back for reevaluation in 1 week here in the clinic. If anything worsens or changes patient will contact our office for additional recommendations. Hopefully the patient will be ready for discharge after that point. Electronic Signature(s) Signed: 11/23/2020 3:07:35 PM By: Worthy Keeler PA-C Entered By: Worthy Keeler on 11/23/2020 15:07:35 Ralph Peterson ([id number]) -------------------------------------------------------------------------------- SuperBill Details Patient Name: Ralph Peterson Date of Service: 11/23/2020 Medical Record Number: [id number] Patient Account Number: [id number] Date of Birth/Sex: 1966/02/26 (55 y.o. M) Treating RN: Dolan Amen Primary Care Provider: Vidal Schwalbe Other Clinician: Referring Provider: Vidal Schwalbe Treating Provider/Extender: Skipper Cliche in Treatment: 30 Diagnosis Coding ICD-10 Codes Code Description [phone] Pressure ulcer of other site, stage 3 T81.31XA Disruption of external operation (surgical) wound, not elsewhere classified, initial encounter 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d L97.812 Non-pressure chronic ulcer of other part of right lower leg with fat layer exposed E11.621 Type 2 diabetes mellitus with foot ulcer I50.42 Chronic combined systolic  (congestive) and diastolic (congestive) heart failure N18.6 End stage renal disease Z99.2 Dependence on renal dialysis Facility Procedures CPT4 Code: [id number] Description: [id number] - WOUND CARE VISIT-LEV 2 EST PT Modifier: Quantity: 1 Physician Procedures CPT4 Code: [id number] Description: 99213 - WC PHYS LEVEL 3 - EST PT Modifier: Quantity: 1 CPT4 Code: Description: ICD-10 Diagnosis Description L89.893 Pressure ulcer of other site, stage 3 T81.31XA Disruption of external operation (surgical) wound, not elsewhere classifi L97.512 Non-pressure chronic ulcer of other part of right foot with fat layer exp  L97.812 Non-pressure chronic ulcer of other part of right lower leg with fat laye Modifier: ed, initial encounter osed r exposed Quantity: Electronic Signature(s) Signed: 11/23/2020 3:07:49 PM By: Worthy Keeler PA-C Entered By: Worthy Keeler on 11/23/2020 15:07:49

## 2020-11-23 NOTE — Progress Notes (Addendum)
DESHON, VERHOEVEN ([id number]) Visit Report for 11/23/2020 Arrival Information Details Patient Name: Ralph Peterson, Ralph Peterson. Date of Service: 11/23/2020 12:45 PM Medical Record Number: [id number] Patient Account Number: [id number] Date of Birth/Sex: 12-04-1965 (55 y.o. M) Treating RN: Carlene Coria Primary Care Christobal Morado: Vidal Schwalbe Other Clinician: Referring Haruko Mersch: Vidal Schwalbe Treating Deliliah Spranger/Extender: Skipper Cliche in Treatment: 44 Visit Information History Since Last Visit All ordered tests and consults were completed: No Patient Arrived: Ambulatory Added or deleted any medications: No Arrival Time: 13:10 Any new allergies or adverse reactions: No Accompanied By: son Had a fall or experienced change in No Transfer Assistance: None activities of daily living that may affect Patient Identification Verified: Yes risk of falls: Secondary Verification Process Completed: Yes Signs or symptoms of abuse/neglect since last visito No Patient Requires Transmission-Based Precautions: No Hospitalized since last visit: No Patient Has Alerts: No Implantable device outside of the clinic excluding No cellular tissue based products placed in the center since last visit: Has Dressing in Place as Prescribed: Yes Pain Present Now: No Electronic Signature(s) Signed: 12/04/2020 8:33:59 AM By: Carlene Coria RN Entered By: Carlene Coria on 11/23/2020 13:13:58 Ralph Peterson ([id number]) -------------------------------------------------------------------------------- Clinic Level of Care Assessment Details Patient Name: Ralph Peterson Date of Service: 11/23/2020 12:45 PM Medical Record Number: [id number] Patient Account Number: [id number] Date of Birth/Sex: 04-May-1966 (55 y.o. M) Treating RN: Dolan Amen Primary Care Read Bonelli: Vidal Schwalbe Other Clinician: Referring Gaige Fussner: Vidal Schwalbe Treating Kailen Hinkle/Extender: Skipper Cliche in Treatment: 30 Clinic Level  of Care Assessment Items TOOL 4 Quantity Score X - Use when only an EandM is performed on FOLLOW-UP visit 1 0 ASSESSMENTS - Nursing Assessment / Reassessment X - Reassessment of Co-morbidities (includes updates in patient status) 1 10 X- 1 5 Reassessment of Adherence to Treatment Plan ASSESSMENTS - Wound and Skin Assessment / Reassessment X - Simple Wound Assessment / Reassessment - one wound 1 5 '[]'$  - 0 Complex Wound Assessment / Reassessment - multiple wounds '[]'$  - 0 Dermatologic / Skin Assessment (not related to wound area) ASSESSMENTS - Focused Assessment '[]'$  - Circumferential Edema Measurements - multi extremities 0 '[]'$  - 0 Nutritional Assessment / Counseling / Intervention '[]'$  - 0 Lower Extremity Assessment (monofilament, tuning fork, pulses) '[]'$  - 0 Peripheral Arterial Disease Assessment (using hand held doppler) ASSESSMENTS - Ostomy and/or Continence Assessment and Care '[]'$  - Incontinence Assessment and Management 0 '[]'$  - 0 Ostomy Care Assessment and Management (repouching, etc.) PROCESS - Coordination of Care X - Simple Patient / Family Education for ongoing care 1 15 '[]'$  - 0 Complex (extensive) Patient / Family Education for ongoing care '[]'$  - 0 Staff obtains Programmer, systems, Records, Test Results / Process Orders '[]'$  - 0 Staff telephones HHA, Nursing Homes / Clarify orders / etc '[]'$  - 0 Routine Transfer to another Facility (non-emergent condition) '[]'$  - 0 Routine Hospital Admission (non-emergent condition) '[]'$  - 0 New Admissions / Biomedical engineer / Ordering NPWT, Apligraf, etc. '[]'$  - 0 Emergency Hospital Admission (emergent condition) X- 1 10 Simple Discharge Coordination '[]'$  - 0 Complex (extensive) Discharge Coordination PROCESS - Special Needs '[]'$  - Pediatric / Minor Patient Management 0 '[]'$  - 0 Isolation Patient Management '[]'$  - 0 Hearing / Language / Visual special needs '[]'$  - 0 Assessment of Community assistance (transportation, D/C planning, etc.) '[]'$  -  0 Additional assistance / Altered mentation '[]'$  - 0 Support Surface(s) Assessment (bed, cushion, seat, etc.) INTERVENTIONS - Wound Cleansing / Measurement Ralph Peterson, Ralph S. ([id number]) X- 1 5 Simple Wound Cleansing -  one wound '[]'$  - 0 Complex Wound Cleansing - multiple wounds X- 1 5 Wound Imaging (photographs - any number of wounds) '[]'$  - 0 Wound Tracing (instead of photographs) X- 1 5 Simple Wound Measurement - one wound '[]'$  - 0 Complex Wound Measurement - multiple wounds INTERVENTIONS - Wound Dressings X - Small Wound Dressing one or multiple wounds 1 10 '[]'$  - 0 Medium Wound Dressing one or multiple wounds '[]'$  - 0 Large Wound Dressing one or multiple wounds '[]'$  - 0 Application of Medications - topical '[]'$  - 0 Application of Medications - injection INTERVENTIONS - Miscellaneous '[]'$  - External ear exam 0 '[]'$  - 0 Specimen Collection (cultures, biopsies, blood, body fluids, etc.) '[]'$  - 0 Specimen(s) / Culture(s) sent or taken to Lab for analysis '[]'$  - 0 Patient Transfer (multiple staff / Civil Service fast streamer / Similar devices) '[]'$  - 0 Simple Staple / Suture removal (25 or less) '[]'$  - 0 Complex Staple / Suture removal (26 or more) '[]'$  - 0 Hypo / Hyperglycemic Management (close monitor of Blood Glucose) '[]'$  - 0 Ankle / Brachial Index (ABI) - do not check if billed separately X- 1 5 Vital Signs Has the patient been seen at the hospital within the last three years: Yes Total Score: 75 Level Of Care: New/Established - Level 2 Electronic Signature(s) Signed: 11/23/2020 5:11:22 PM By: Georges Mouse, Minus Breeding RN Entered By: Georges Mouse, Kenia on 11/23/2020 13:41:58 Ralph Peterson ([id number]) -------------------------------------------------------------------------------- Encounter Discharge Information Details Patient Name: Ralph Peterson Date of Service: 11/23/2020 12:45 PM Medical Record Number: [id number] Patient Account Number: [id number] Date of Birth/Sex: [birth year] (55  y.o. M) Treating RN: Dolan Amen Primary Care Lakevia Perris: Vidal Schwalbe Other Clinician: Referring Roneisha Stern: Vidal Schwalbe Treating Deklan Minar/Extender: Skipper Cliche in Treatment: 30 Encounter Discharge Information Items Discharge Condition: Stable Ambulatory Status: Ambulatory Discharge Destination: Home Transportation: Private Auto Accompanied By: son Schedule Follow-up Appointment: Yes Clinical Summary of Care: Electronic Signature(s) Signed: 11/23/2020 4:47:27 PM By: Jeanine Luz Entered By: Jeanine Luz on 11/23/2020 13:53:10 Ralph Peterson ([id number]) -------------------------------------------------------------------------------- Lower Extremity Assessment Details Patient Name: Ralph Peterson. Date of Service: 11/23/2020 12:45 PM Medical Record Number: [id number] Patient Account Number: [id number] Date of Birth/Sex: 1965/07/29 (55 y.o. M) Treating RN: Carlene Coria Primary Care Ovella Manygoats: Vidal Schwalbe Other Clinician: Referring Saverio Kader: Vidal Schwalbe Treating Brettany Sydney/Extender: Skipper Cliche in Treatment: 30 Electronic Signature(s) Signed: 12/04/2020 8:33:59 AM By: Carlene Coria RN Entered By: Carlene Coria on 11/23/2020 13:22:45 Ralph Peterson ([id number]) -------------------------------------------------------------------------------- Multi Wound Chart Details Patient Name: Ralph Peterson. Date of Service: 11/23/2020 12:45 PM Medical Record Number: [id number] Patient Account Number: [id number] Date of Birth/Sex: 18-Aug-1965 (55 y.o. M) Treating RN: Dolan Amen Primary Care Miangel Flom: Vidal Schwalbe Other Clinician: Referring Baelynn Schmuhl: Vidal Schwalbe Treating Janya Eveland/Extender: Skipper Cliche in Treatment: 30 Vital Signs Height(in): 72 Pulse(bpm): 69 Weight(lbs): 225 Blood Pressure(mmHg): 120/75 Body Mass Index(BMI): 31 Temperature(F): 98.1 Respiratory Rate(breaths/min): 18 Photos: [N/A:N/A] Wound Location: Right,  Distal, Dorsal Foot N/A N/A Wounding Event: Surgical Injury N/A N/A Primary Etiology: Open Surgical Wound N/A N/A Comorbid History: Arrhythmia, Congestive Heart N/A N/A Failure, Coronary Artery Disease, Hypertension, Myocardial Infarction, Peripheral Venous Disease, Type II Diabetes, Neuropathy Date Acquired: 04/20/2020 N/A N/A Weeks of Treatment: 30 N/A N/A Wound Status: Healed - Epithelialized N/A N/A Wound Recurrence: Yes N/A N/A Measurements L x W x D (cm) 0x0x0 N/A N/A Area (cm) : 0 N/A N/A Volume (cm) : 0 N/A N/A % Reduction in Area: 100.00% N/A N/A % Reduction in Volume: 100.00% N/A  N/A Classification: Full Thickness Without Exposed N/A N/A Support Structures Exudate Amount: None Present N/A N/A Wound Margin: Distinct, outline attached N/A N/A Granulation Amount: None Present (0%) N/A N/A Necrotic Amount: None Present (0%) N/A N/A Exposed Structures: Fascia: No N/A N/A Fat Layer (Subcutaneous Tissue): No Tendon: No Muscle: No Joint: No Bone: No Epithelialization: Large (67-100%) N/A N/A Treatment Notes Electronic Signature(s) Signed: 11/23/2020 5:11:22 PM By: Georges Mouse, Minus Breeding RN Entered By: Georges Mouse, Minus Breeding on 11/23/2020 13:37:27 Ralph Peterson ([id number]) -------------------------------------------------------------------------------- [place] Details Patient Name: Ralph Peterson Date of Service: 11/23/2020 12:45 PM Medical Record Number: [id number] Patient Account Number: [id number] Date of Birth/Sex: 1965/10/25 (55 y.o. M) Treating RN: Dolan Amen Primary Care Tymothy Cass: Vidal Schwalbe Other Clinician: Referring Jakson Delpilar: Vidal Schwalbe Treating Courtenay Creger/Extender: Skipper Cliche in Treatment: 30 Active Inactive Electronic Signature(s) Signed: 11/23/2020 5:11:22 PM By: Georges Mouse, Minus Breeding RN Entered By: Georges Mouse, Minus Breeding on 11/23/2020 13:34:21 Ralph Peterson  ([id number]) -------------------------------------------------------------------------------- Pain Assessment Details Patient Name: Ralph Peterson Date of Service: 11/23/2020 12:45 PM Medical Record Number: [id number] Patient Account Number: [id number] Date of Birth/Sex: 1965-08-09 (55 y.o. M) Treating RN: Carlene Coria Primary Care Curtisha Bendix: Vidal Schwalbe Other Clinician: Referring Radhika Dershem: Vidal Schwalbe Treating Silas Sedam/Extender: Skipper Cliche in Treatment: 30 Active Problems Location of Pain Severity and Description of Pain Patient Has Paino No Site Locations Pain Management and Medication Current Pain Management: Electronic Signature(s) Signed: 12/04/2020 8:33:59 AM By: Carlene Coria RN Entered By: Carlene Coria on 11/23/2020 13:14:31 Ralph Peterson ([id number]) -------------------------------------------------------------------------------- Patient/Caregiver Education Details Patient Name: Ralph Peterson Date of Service: 11/23/2020 12:45 PM Medical Record Number: [id number] Patient Account Number: [id number] Date of Birth/Gender: 1966-06-10 (55 y.o. M) Treating RN: Dolan Amen Primary Care Physician: Vidal Schwalbe Other Clinician: Referring Physician: Vidal Schwalbe Treating Physician/Extender: Skipper Cliche in Treatment: 30 Education Assessment Education Provided To: Patient Education Topics Provided Wound/Skin Impairment: Methods: Explain/Verbal Responses: State content correctly Electronic Signature(s) Signed: 11/23/2020 5:11:22 PM By: Georges Mouse, Minus Breeding RN Entered By: Georges Mouse, Minus Breeding on 11/23/2020 13:42:18 Ralph Peterson ([id number]) -------------------------------------------------------------------------------- Wound Assessment Details Patient Name: Ralph Peterson Date of Service: 11/23/2020 12:45 PM Medical Record Number: [id number] Patient Account Number: [id number] Date of Birth/Sex: 09/16/1965 (55 y.o.  M) Treating RN: Carlene Coria Primary Care Lexianna Weinrich: Vidal Schwalbe Other Clinician: Referring Lakyn Alsteen: Vidal Schwalbe Treating Marcquis Ridlon/Extender: Skipper Cliche in Treatment: 30 Wound Status Wound Number: [id number] Primary Open Surgical Wound Etiology: Wound Location: Right, Distal, Dorsal Foot Wound Healed - Epithelialized Wounding Event: Surgical Injury Status: Date Acquired: 04/20/2020 Comorbid Arrhythmia, Congestive Heart Failure, Coronary Artery Weeks Of Treatment: 30 History: Disease, Hypertension, Myocardial Infarction, Peripheral Clustered Wound: No Venous Disease, Type II Diabetes, Neuropathy Photos Wound Measurements Length: (cm) 0 Width: (cm) 0 Depth: (cm) 0 Area: (cm) 0 Volume: (cm) 0 % Reduction in Area: 100% % Reduction in Volume: 100% Epithelialization: Large (67-100%) Tunneling: No Undermining: No Wound Description Classification: Full Thickness Without Exposed Support Structures Wound Margin: Distinct, outline attached Exudate Amount: None Present Foul Odor After Cleansing: No Slough/Fibrino No Wound Bed Granulation Amount: None Present (0%) Exposed Structure Necrotic Amount: None Present (0%) Fascia Exposed: No Fat Layer (Subcutaneous Tissue) Exposed: No Tendon Exposed: No Muscle Exposed: No Joint Exposed: No Bone Exposed: No Electronic Signature(s) Signed: 11/23/2020 5:11:22 PM By: Georges Mouse, Minus Breeding RN Signed: 12/04/2020 8:33:59 AM By: Carlene Coria RN Entered By: Georges Mouse, Minus Breeding on 11/23/2020 13:34:05 Ralph Peterson ([id number]) -------------------------------------------------------------------------------- Vitals Details Patient Name: Ralph Peterson. Date of  Service: 11/23/2020 12:45 PM Medical Record Number: [id number] Patient Account Number: [id number] Date of Birth/Sex: 06-Sep-1965 (55 y.o. M) Treating RN: Carlene Coria Primary Care Claudio Mondry: Vidal Schwalbe Other Clinician: Referring Ravynn Hogate: Vidal Schwalbe Treating  Shyanna Klingel/Extender: Skipper Cliche in Treatment: 30 Vital Signs Time Taken: 13:14 Temperature (F): 98.1 Height (in): 72 Pulse (bpm): 79 Weight (lbs): 225 Respiratory Rate (breaths/min): 18 Body Mass Index (BMI): 30.5 Blood Pressure (mmHg): 120/75 Reference Range: 80 - 120 mg / dl Electronic Signature(s) Signed: 12/04/2020 8:33:59 AM By: Carlene Coria RN Entered By: Carlene Coria on 11/23/2020 13:14:21

## 2020-11-30 ENCOUNTER — Other Ambulatory Visit: Payer: Self-pay

## 2020-11-30 ENCOUNTER — Encounter: Payer: Medicare PPO | Admitting: Physician Assistant

## 2020-11-30 DIAGNOSIS — E11621 Type 2 diabetes mellitus with foot ulcer: Secondary | ICD-10-CM | POA: Diagnosis not present

## 2020-11-30 NOTE — Progress Notes (Addendum)
Ralph, Peterson ([id number]) Visit Report for 11/30/2020 Chief Complaint Document Details Patient Name: Ralph Peterson, Ralph Peterson. Date of Service: 11/30/2020 2:00 PM Medical Record Number: [id number] Patient Account Number: [id number] Date of Birth/Sex: [birth year] (55 y.o. 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 M) Treating RN: Dolan Amen Primary Care Provider: Vidal Schwalbe Other Clinician: Referring Provider: Vidal Schwalbe Treating Provider/Extender: Skipper Cliche in Treatment: 31 History of Present Illness HPI Description: 08/17/2020 upon evaluation today patient actually appears to be doing decently well today which is good news in regard to the dorsal wound which is almost healed and the lateral wound also doing much better. With regard to the plantar wound this is still draining purulent drainage unfortunately the culture did reveal that he had Pseudomonas as well as Enterococcus. With that being said he is allergic to penicillin which took away the only oral medication for the Enterococcus I did look at linezolid as a possibility but unfortunately there were medication interactions. Also the same was true the Cipro where there were medication interactions.  Nonetheless I want to see if we can get him into infectious disease to see if they can work with him to try to find possibly add at home IV antibiotic regimen that can help out at this point. No sharp debridement is can be necessary today. 09/07/2020 upon evaluation today patient appears to be doing unfortunately not too well in regard to his wound. He has been tolerating the dressing changes without complication. Fortunately there is no sign of active infection at this time. No fevers, chills, nausea, vomiting, or diarrhea. The patient did have a repeat surgery going to clean out the abscess and try to work on getting things moving in a better direction about a week and a half ago. He still has sutures in place. With that being said unfortunately he does not appear to be showing signs of a lot of improvement in my opinion. He does have sutures on the plantar aspect still in place and on the dorsal aspect he has a small opening which actually probes all the way down through and actually came out of the plantar aspect. I feel like that he really is having a lot of issues here and I am afraid that if we do not get this taken care of this is can end up with him having a much larger and more extensive amputation than what he was hoping to have to deal with with this. It has already been since June 2021 but has been dealing with this including all the time since the amputation. 09/14/2020 upon evaluation today patient appears to be doing really about the same in regard to the wound on the dorsal surface of his foot. The plantar foot unfortunately still has sutures in place I really cannot see how things are doing in that regard. With the dorsal foot however this appears to show signs of still having a small opening in the distal aspect which does actually probe down to  bone there is also significant purulent drainage we did actually obtain a wound culture today to further evaluate the issue here. 09/21/2020  upon evaluation today patient appears to be doing about the same in regard to his wound on the dorsal surface of his foot and the plantar foot he still does not have the sutures out at this point. That should hopefully happen in the next week. With that being said the main issue is he continues to have drainage here. It was noted that he had positive findings on culture for Enterobacter. With that being said he cannot take oral penicillin he could do vancomycin and I think that is good to be the ideal thing this to see if we can get him started on IV vancomycin at dialysis. We will get a get in touch with them to try to see if we can coordinate that being done there at the dialysis center. I think that is the ideal situation at this point I would probably start with just 2 weeks of therapy. 09/28/2020 upon evaluation today patient appears to be doing well with regard to his wounds in general. He also has back on the vancomycin after the culture results and this seems to be doing excellent for him. All the wounds are showing signs of good improvement. No fevers, chills, nausea, vomiting, or diarrhea. 10/05/2020 upon evaluation today patient appears to actually be doing excellent in regard to his foot. Fortunately there is a lot of signs of new granulation epithelization at this point. There does not appear to be any evidence of active infection which is great news and overall very pleased with where things stand. In fact the open wound along the plantar aspect of his foot as well as the dorsal part of his foot has dramatically improved since we went forward with the vancomycin and requested that the dialysis center provide this to him by way of IV antibiotics with his dialysis. Overall I think that has made a dramatic improvement and overall his wound seems to be doing significantly better at this point. No fevers, chills, nausea, vomiting, or diarrhea. The patient was set to have his second opinion  appointment with podiatry [facility] next week but to be perfectly honest I am not even certain that that is necessary at this point I do not think there is much that they would be able to do for him nor that he really would want them to do considering how well things look at this time. 10/12/2020 on evaluation today patient appears to be doing excellent in regard to his wounds. In fact the only issue I see is that everything is dried out as far as the collagen is concerned. I do think we can try to keep this moist he will continue to make excellent progress as he has been. Overall I am extremely pleased with what I see today. 10/19/2020 upon evaluation today patient appears to be doing decently well in regard to the wounds on his foot he is can require some debridement in this location. Unfortunately is having some issues with blistering over the leg which is of greater concern at this point. Fortunately I do not see any signs of active infection which is great news. Nonetheless the blistering may just be related to venous insufficiency or subsequently could potentially be an issue with overall worsening with regard to an infection but again it is not really hot to touch. 10/26/2020 upon evaluation today patient appears to be doing well with regard  to the majority of his wounds. Unfortunately the dorsal surface of his foot does not appear to be doing quite as well today compared to what it has been doing. I do think that we will need to address that currently. Everything else is showing signs of improvement. He did not get the prescription that I gave him previous as unfortunately the transmission failed. I did not know that until today when the patient mention to me that he never got the antibiotic. 11/02/20 upon evaluation today patient appears to be doing much better in regard to his wounds in general. I feel like that he is making good progress and even the top of the foot does not appear to be doing too  poorly at this time. He did finally get the antibiotics which is good. Unfortunately it does not look like I am able to electronically sent to his pharmacy it kept being an error in transmission every time this was sent. We finally decided to call it in. Nonetheless overall I think he is headed in the right direction based on what I am seeing. 11/09/2020 upon evaluation today patient appears to be doing well in general in regard to his wounds. I think that he is making some progress here. With that being said there is definitely still some areas of concern. Specifically the dorsal aspect of his foot distally and the lateral aspect of his foot proximally. Both of these areas are still open everything else appears to likely be closed today based on what I am seeing. 4/28; the patient really looks quite good. The area over the base of the right fifth metatarsal and the base of the fifth MTP both look healed to me. 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 They are using Hydrofera Blue rope but I think this is far too small wound now for this 11/23/2020 upon evaluation today patient actually appears to be doing excellent. In fact I do not really see anything that is actually open anywhere at this point. With that being said I am a little reluctant to completely just healing out and discharge him based on the dorsal foot wound being closed even though I do not see anything open we have been there down this road before where things would reopen causing additional problems. Nonetheless I think that we need to monitor him a little bit more closely to make sure nothing worsens again. 11/30/2020 upon evaluation today patient actually appears to be doing quite well in regard to his wound at this point. He has been tolerating the dressing changes without complication which is great news and overall I am  extremely pleased with where things stand today. No fevers, chills, nausea, vomiting, or diarrhea. Electronic Signature(s) Signed: 11/30/2020 6:35:01 PM By: Worthy Keeler PA-C Entered By: Worthy Keeler on 11/30/2020 18:35:00 Ralph Peterson ([id number]) -------------------------------------------------------------------------------- Physical Exam Details Patient Name: Ralph Peterson Date of Service: 11/30/2020 2:00 PM Medical Record Number: [id number] Patient Account Number: [id number] Date of Birth/Sex: 02-28-1966 (55 y.o. M) Treating RN: Dolan Amen Primary Care Provider: Vidal Schwalbe Other Clinician: Referring Provider: Vidal Schwalbe Treating Provider/Extender: Skipper Cliche in Treatment: 54 Constitutional Well-nourished and well-hydrated in no acute distress. Respiratory normal breathing without difficulty. Psychiatric this patient is able to make decisions and demonstrates good insight into disease process. Alert and Oriented x 3. pleasant and cooperative. Notes Upon inspection  patient does not appear to show any signs of open wounds at this time which is great news and overall very pleased in that regard. Electronic Signature(s) Signed: 11/30/2020 6:35:13 PM By: Worthy Keeler PA-C Entered By: Worthy Keeler on 11/30/2020 18:35:13 Ralph Peterson ([id number]) -------------------------------------------------------------------------------- Physician Orders Details Patient Name: Ralph Peterson Date of Service: 11/30/2020 2:00 PM Medical Record Number: [id number] Patient Account Number: [id number] Date of Birth/Sex: 10-Jul-1966 (55 y.o. 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 Date of Service: 11/30/2020 2:00 PM Medical Record Number: [id number] Patient Account Number: [id number] Date of Birth/Sex: [birth year] (55 y.o. M) Treating RN: Dolan Amen Primary Care Provider: Vidal Schwalbe Other Clinician: Referring Provider: Vidal Schwalbe Treating Provider/Extender: Skipper Cliche in Treatment: 31 Active Problems ICD-10 Encounter Code Description Active Date MDM Diagnosis L89.893 Pressure ulcer of other site, stage 3 04/27/2020 No Yes T81.31XA Disruption of external operation (surgical) wound, not elsewhere 04/27/2020 No Yes classified, initial encounter L97.512 Non-pressure chronic ulcer of other part of right foot with fat layer 04/27/2020 No Yes exposed L97.812 Non-pressure chronic ulcer of other part of right lower leg with fat layer 04/27/2020 No Yes exposed E11.621 Type 2 diabetes mellitus with foot ulcer 04/27/2020 No Yes I50.42 Chronic combined systolic (congestive) and diastolic (congestive) heart 04/27/2020 No Yes failure N18.6  End stage renal disease 04/27/2020 No Yes Z99.2 Dependence on renal dialysis 04/27/2020 No Yes Inactive Problems Resolved Problems Electronic Signature(s) Signed: 11/30/2020 2:22:04 PM By: Worthy Keeler PA-C Entered By: Worthy Keeler on 11/30/2020 14:22:04 Ralph Peterson ([id number]) -------------------------------------------------------------------------------- Progress Note Details Patient Name: Ralph Peterson Date of Service: 11/30/2020 2:00 PM Medical Record Number: [id number] Patient Account Number: [id number] Date of Birth/Sex: [birth year] (55 y.o. 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 With regard to the plantar wound this is still draining purulent drainage unfortunately the culture did reveal that he had Pseudomonas as well as Enterococcus. With that being said he is allergic to penicillin which took away the only oral medication for the Enterococcus I did look at linezolid as a possibility but unfortunately there were medication interactions. Also the same  was true the Cipro where there were medication interactions. Nonetheless I want to see if we can get him into infectious disease to see if they can work with him to try to find possibly add at home IV antibiotic regimen that can help out at this point. No sharp debridement is can be necessary today. 09/07/2020 upon evaluation today patient appears to be doing unfortunately not too well in  regard to his wound. He has been tolerating the dressing changes without complication. Fortunately there is no sign of active infection at this time. No fevers, chills, nausea, vomiting, or diarrhea. The patient did have a repeat surgery going to clean out the abscess and try to work on getting things moving in a better direction about a week and a half ago. He still has sutures in place. With that being said unfortunately he does not appear to be showing signs of a lot of improvement in my opinion. He does have sutures on the plantar aspect still in place and on the dorsal aspect he has a small opening which actually probes all the way down through and actually came out of the plantar aspect. I feel like that he really is having a lot of issues here and I am afraid that if we do not get this taken care of this is can end up with him having a much larger and more extensive amputation than what he was hoping to have to deal with with this. It has already been since June 2021 but has been dealing with this including all the time since the amputation. 09/14/2020 upon evaluation today patient appears to be doing really about the same in regard to the wound on the dorsal surface of his foot. The plantar foot unfortunately still has sutures in place I really cannot see how things are doing in that regard. With the dorsal foot however this appears to show signs of still having a small opening in the distal aspect which does actually probe down to bone there is also significant purulent drainage we did actually obtain a wound culture today to further evaluate the issue here. 09/21/2020 upon evaluation today patient appears to be doing about the same in regard to his wound on the dorsal surface of his foot and the plantar foot he still does not have the sutures out at this point. That should hopefully happen in the next week. With that being said the main issue is he continues to have drainage here. It was noted  that he had positive findings on culture for Enterobacter. With that being said he cannot take oral penicillin he could do vancomycin and I think that is good to be the ideal thing this to see if we can get him started on IV vancomycin at dialysis. We will get a get in touch with them to try to see if we can coordinate that being done there at the dialysis center. I think that is the ideal situation at this point I would probably start with just 2 weeks of therapy. 09/28/2020 upon evaluation today patient appears to be doing well with regard to his wounds in general. He also has back on the vancomycin after the culture results and this seems to be doing excellent for him. All the wounds are showing signs of good improvement. No fevers, chills, nausea, vomiting, or diarrhea. 10/05/2020 upon evaluation today patient appears to actually be doing excellent in regard to his foot. Fortunately there is a lot of signs of new granulation  epithelization at this point. There does not appear to be any evidence of active infection which is great news and overall very pleased with where things stand. In fact the open wound along the plantar aspect of his foot as well as the dorsal part of his foot has dramatically improved since we went forward with the vancomycin and requested that the dialysis center provide this to him by way of IV antibiotics with his dialysis. Overall I think that has made a dramatic improvement and overall his wound seems to be doing significantly better at this point. No fevers, chills, nausea, vomiting, or diarrhea. The patient was set to have his second opinion appointment with podiatry [facility] next week but to be perfectly honest I am not even certain that that is necessary at this point I do not think there is much that they would be able to do for him nor that he really would want them to do considering how well things look at this time. 10/12/2020 on evaluation today patient appears to be  doing excellent in regard to his wounds. In fact the only issue I see is that everything is dried out as far as the collagen is concerned. I do think we can try to keep this moist he will continue to make excellent progress as he has been. Overall I am extremely pleased with what I see today. 10/19/2020 upon evaluation today patient appears to be doing decently well in regard to the wounds on his foot he is can require some debridement in this location. Unfortunately is having some issues with blistering over the leg which is of greater concern at this point. Fortunately I do not see any signs of active infection which is great news. Nonetheless the blistering may just be related to venous insufficiency or subsequently could potentially be an issue with overall worsening with regard to an infection but again it is not really hot to touch. 10/26/2020 upon evaluation today patient appears to be doing well with regard to the majority of his wounds. Unfortunately the dorsal surface of his foot does not appear to be doing quite as well today compared to what it has been doing. I do think that we will need to address that currently. Everything else is showing signs of improvement. He did not get the prescription that I gave him previous as unfortunately the transmission failed. I did not know that until today when the patient mention to me that he never got the antibiotic. 11/02/20 upon evaluation today patient appears to be doing much better in regard to his wounds in general. I feel like that he is making good progress and even the top of the foot does not appear to be doing too poorly at this time. He did finally get the antibiotics which is good. Unfortunately it does not look like I am able to electronically sent to his pharmacy it kept being an error in transmission every time this was sent. We finally decided to call it in. Nonetheless overall I think he is headed in the right direction based on what I am  seeing. CHRISTOPH, SPAULDING ([id number]) 11/09/2020 upon evaluation today patient appears to be doing well in general in regard to his wounds. I think that he is making some progress here. With that being said there is definitely still some areas of concern. Specifically the dorsal aspect of his foot distally and the lateral aspect of his foot proximally. Both of these areas are still open everything else appears  to likely be closed today based on what I am seeing. 4/28; the patient really looks quite good. The area over the base of the right fifth metatarsal and the base of the fifth MTP both look healed to me. 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 They are using Hydrofera Blue rope but I think this is far too small wound now for this 11/23/2020 upon evaluation today patient actually appears to be doing excellent. In fact I do not really see anything that is actually open anywhere at this point. With that being said I am a little reluctant to completely just healing out and discharge him based on the dorsal foot wound being closed even though I do not see anything open we have been there down this road before where things would reopen causing additional problems. Nonetheless I think that we need to monitor him a little bit more closely to make sure nothing worsens again. 11/30/2020 upon evaluation today patient actually appears to be doing quite well in regard to his wound at this point. He has been tolerating the dressing changes without complication which is great news and overall I am extremely pleased with where things stand today. No fevers, chills, nausea, vomiting, or diarrhea. Objective Constitutional Well-nourished and well-hydrated in no acute distress. Vitals Time Taken: 2:02 PM, Height: 72 in, Weight: 225 lbs, BMI: 30.5, Temperature: 98 F, Pulse: 94 bpm, Respiratory Rate: 16 breaths/min, Blood  Pressure: 132/83 mmHg. Respiratory normal breathing without difficulty. Psychiatric this patient is able to make decisions and demonstrates good insight into disease process. Alert and Oriented x 3. pleasant and cooperative. General Notes: Upon inspection patient does not appear to show any signs of open wounds at this time which is great news and overall very pleased in that regard. 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 ([id number]) 1. I would recommend that we going to continue to monitor for any signs of worsening in general. If anything changes then obviously he should let me know but otherwise I am extremely happy that everything is closed today. 2. I would recommend that he continue to take it slow as far as getting back into walking he seems to be doing fine however in this regard. We will see the patient back for follow-up visit as needed. Electronic Signature(s) Signed: 11/30/2020 6:36:12 PM By: Worthy Keeler PA-C Entered By: Worthy Keeler on 11/30/2020 18:36:12 Ralph Peterson ([id number]) -------------------------------------------------------------------------------- SuperBill Details Patient Name: Ralph Peterson Date of Service: 11/30/2020 Medical Record Number: [id number] Patient Account Number: [id number] Date of Birth/Sex: Dec 29, 1965 (55 y.o. M) Treating RN: Dolan Amen Primary Care  Provider: Vidal Schwalbe Other Clinician: Referring Provider: Vidal Schwalbe Treating Provider/Extender: Skipper Cliche in Treatment: 31 Diagnosis Coding ICD-10 Codes Code Description [phone] Pressure ulcer of other site, stage 3 T81.31XA Disruption of external operation (surgical) wound, not elsewhere classified, initial encounter L97.512 Non-pressure chronic ulcer of other part of right foot with fat  layer exposed L97.812 Non-pressure chronic ulcer of other part of right lower leg with fat layer exposed E11.621 Type 2 diabetes mellitus with foot ulcer I50.42 Chronic combined systolic (congestive) and diastolic (congestive) heart failure N18.6 End stage renal disease Z99.2 Dependence on renal dialysis Facility Procedures CPT4 Code: [id number] Description: [id number] - WOUND CARE VISIT-LEV 2 EST PT Modifier: Quantity: 1 Physician Procedures CPT4 Code: [id number] Description: 99213 - WC PHYS LEVEL 3 - EST PT Modifier: Quantity: 1 CPT4 Code: Description: ICD-10 Diagnosis Description L89.893 Pressure ulcer of other site, stage 3 T81.31XA Disruption of external operation (surgical) wound, not elsewhere classifi L97.512 Non-pressure chronic ulcer of other part of right foot with fat layer exp  L97.812 Non-pressure chronic ulcer of other part of right lower leg with fat laye Modifier: ed, initial encounter osed r exposed Quantity: Electronic Signature(s) Signed: 11/30/2020 6:40:42 PM By: Worthy Keeler PA-C Previous Signature: 11/30/2020 5:06:40 PM Version By: Georges Mouse, Minus Breeding RN Entered By: Worthy Keeler on 11/30/2020 18:40:41

## 2020-12-01 NOTE — Progress Notes (Signed)
Ralph Peterson ([id number]) Visit Report for 11/30/2020 Arrival Information Details Patient Name: Ralph Peterson, Ralph Peterson. Date of Service: 11/30/2020 2:00 PM Medical Record Number: [id number] Patient Account Number: [id number] Date of Birth/Sex: [birth year] (55 y.o. M) Treating RN: Donnamarie Poag Primary Care Shellie Rogoff: Vidal Schwalbe Other Clinician: Referring Damilola Flamm: Vidal Schwalbe Treating Anamika Kueker/Extender: Skipper Cliche in Treatment: 55 Visit Information History Since Last Visit Added or deleted any medications: No Patient Arrived: [name] Had a fall or experienced change in No Arrival Time: 14:01 activities of daily living that may affect Accompanied By: son risk of falls: Transfer Assistance: None Hospitalized since last visit: No Patient Identification Verified: Yes Has Dressing in Place as Prescribed: Yes Secondary Verification Process Completed: Yes Pain Present Now: No Patient Requires Transmission-Based No Precautions: Patient Has Alerts: Yes Patient Alerts: Patient on [place] Signature(s) Signed: 12/01/2020 10:34:15 AM By: Donnamarie Poag Entered By: Donnamarie Poag on 11/30/2020 14:02:55 Ralph Peterson ([id number]) -------------------------------------------------------------------------------- Clinic Level of Care Assessment Details Patient Name: Ralph Peterson Date of Service: 11/30/2020 2:00 PM Medical Record Number: [id number] Patient Account Number: [id number] Date of Birth/Sex: 1965-10-27 (55 y.o. M) Treating RN: Dolan Amen Primary Care Isaack Preble: Vidal Schwalbe Other Clinician: Referring Danelia Snodgrass: Vidal Schwalbe Treating Jamal Pavon/Extender: Skipper Cliche in Treatment: 31 Clinic Level of Care Assessment Items TOOL 4 Quantity Score X - Use when only an EandM is performed on FOLLOW-UP visit 1 0 ASSESSMENTS - Nursing Assessment / Reassessment X - Reassessment of Co-morbidities (includes updates in patient  status) 1 10 X- 1 5 Reassessment of Adherence to Treatment Plan ASSESSMENTS - Wound and Skin Assessment / Reassessment '[]'$  - Simple Wound Assessment / Reassessment - one wound 0 '[]'$  - 0 Complex Wound Assessment / Reassessment - multiple wounds '[]'$  - 0 Dermatologic / Skin Assessment (not related to wound area) ASSESSMENTS - Focused Assessment '[]'$  - Circumferential Edema Measurements - multi extremities 0 '[]'$  - 0 Nutritional Assessment / Counseling / Intervention '[]'$  - 0 Lower Extremity Assessment (monofilament, tuning fork, pulses) '[]'$  - 0 Peripheral Arterial Disease Assessment (using hand held doppler) ASSESSMENTS - Ostomy and/or Continence Assessment and Care '[]'$  - Incontinence Assessment and Management 0 '[]'$  - 0 Ostomy Care Assessment and Management (repouching, etc.) PROCESS - Coordination of Care X - Simple Patient / Family Education for ongoing care 1 15 '[]'$  - 0 Complex (extensive) Patient / Family Education for ongoing care '[]'$  - 0 Staff obtains Programmer, systems, Records, Test Results / Process Orders '[]'$  - 0 Staff telephones HHA, Nursing Homes / Clarify orders / etc '[]'$  - 0 Routine Transfer to another Facility (non-emergent condition) '[]'$  - 0 Routine Hospital Admission (non-emergent condition) '[]'$  - 0 New Admissions / Biomedical engineer / Ordering NPWT, Apligraf, etc. '[]'$  - 0 Emergency Hospital Admission (emergent condition) X- 1 10 Simple Discharge Coordination '[]'$  - 0 Complex (extensive) Discharge Coordination PROCESS - Special Needs '[]'$  - Pediatric / Minor Patient Management 0 '[]'$  - 0 Isolation Patient Management '[]'$  - 0 Hearing / Language / Visual special needs '[]'$  - 0 Assessment of Community assistance (transportation, D/C planning, etc.) '[]'$  - 0 Additional assistance / Altered mentation '[]'$  - 0 Support Surface(s) Assessment (bed, cushion, seat, etc.) INTERVENTIONS - Wound Cleansing / Measurement [place], Chas S. ([id number]) '[]'$  - 0 Simple Wound Cleansing - one  wound '[]'$  - 0 Complex Wound Cleansing - multiple wounds '[]'$  - 0 Wound Imaging (photographs - any number of wounds) '[]'$  - 0 Wound Tracing (instead of photographs) '[]'$  - 0 Simple Wound Measurement -  one wound '[]'$  - 0 Complex Wound Measurement - multiple wounds INTERVENTIONS - Wound Dressings '[]'$  - Small Wound Dressing one or multiple wounds 0 '[]'$  - 0 Medium Wound Dressing one or multiple wounds '[]'$  - 0 Large Wound Dressing one or multiple wounds '[]'$  - 0 Application of Medications - topical '[]'$  - 0 Application of Medications - injection INTERVENTIONS - Miscellaneous '[]'$  - External ear exam 0 '[]'$  - 0 Specimen Collection (cultures, biopsies, blood, body fluids, etc.) '[]'$  - 0 Specimen(s) / Culture(s) sent or taken to Lab for analysis '[]'$  - 0 Patient Transfer (multiple staff / Civil Service fast streamer / Similar devices) '[]'$  - 0 Simple Staple / Suture removal (25 or less) '[]'$  - 0 Complex Staple / Suture removal (26 or more) '[]'$  - 0 Hypo / Hyperglycemic Management (close monitor of Blood Glucose) '[]'$  - 0 Ankle / Brachial Index (ABI) - do not check if billed separately X- 1 5 Vital Signs Has the patient been seen at the hospital within the last three years: Yes Total Score: 45 Level Of Care: New/Established - Level 2 Electronic Signature(s) Signed: 11/30/2020 5:06:40 PM By: Georges Mouse, Minus Breeding RN Entered By: Georges Mouse, Kenia on 11/30/2020 14:25:07 Ralph Peterson ([id number]) -------------------------------------------------------------------------------- Encounter Discharge Information Details Patient Name: Ralph Peterson Date of Service: 11/30/2020 2:00 PM Medical Record Number: [id number] Patient Account Number: [id number] Date of Birth/Sex: [birth year] (55 y.o. M) Treating RN: Dolan Amen Primary Care Jessilyn Catino: Vidal Schwalbe Other Clinician: Referring Donata Reddick: Vidal Schwalbe Treating Jeslie Lowe/Extender: Skipper Cliche in Treatment: 31 Encounter Discharge Information  Items Discharge Condition: Stable Ambulatory Status: Cane Discharge Destination: Home Transportation: Private Auto Accompanied By: self Schedule Follow-up Appointment: No Clinical Summary of Care: Electronic Signature(s) Signed: 11/30/2020 5:06:40 PM By: Georges Mouse, Minus Breeding RN Entered By: Georges Mouse, Minus Breeding on 11/30/2020 14:25:59 Ralph Peterson ([id number]) -------------------------------------------------------------------------------- Lower Extremity Assessment Details Patient Name: Ralph Peterson Date of Service: 11/30/2020 2:00 PM Medical Record Number: [id number] Patient Account Number: [id number] Date of Birth/Sex: 06-21-1966 (55 y.o. M) Treating RN: Donnamarie Poag Primary Care Tyrina Hines: Vidal Schwalbe Other Clinician: Referring Joseline Mccampbell: Vidal Schwalbe Treating Noemi Ishmael/Extender: Skipper Cliche in Treatment: 31 Edema Assessment Assessed: [Left: No] [Right: Yes] [Left: Edema] [Right: :] Calf Left: Right: Point of Measurement: From Medial Instep 32 cm Ankle Left: Right: Point of Measurement: From Medial Instep 20 cm Knee To Floor Left: Right: From Medial Instep 47 cm Vascular Assessment Pulses: Dorsalis Pedis Palpable: [Right:Yes] Electronic Signature(s) Signed: 12/01/2020 10:34:15 AM By: Donnamarie Poag Entered By: Donnamarie Poag on 11/30/2020 14:08:26 Ralph Peterson ([id number]) -------------------------------------------------------------------------------- Multi Wound Chart Details Patient Name: Ralph Peterson. Date of Service: 11/30/2020 2:00 PM Medical Record Number: [id number] Patient Account Number: [id number] Date of Birth/Sex: 1966-03-14 (55 y.o. M) Treating RN: Dolan Amen Primary Care Giankarlo Leamer: Vidal Schwalbe Other Clinician: Referring Nataya Bastedo: Vidal Schwalbe Treating Janari Gagner/Extender: Skipper Cliche in Treatment: 31 Vital Signs Height(in): 72 Pulse(bpm): 94 Weight(lbs): 225 Blood Pressure(mmHg): 132/83 Body Mass  Index(BMI): 31 Temperature(F): 98 Respiratory Rate(breaths/min): 16 Wound Assessments Treatment Notes Electronic Signature(s) Signed: 11/30/2020 5:06:40 PM By: Georges Mouse, Minus Breeding RN Entered By: Georges Mouse, Kenia on 11/30/2020 14:23:17 Ralph Peterson ([id number]) -------------------------------------------------------------------------------- Multi-Disciplinary Care Plan Details Patient Name: Ralph Peterson Date of Service: 11/30/2020 2:00 PM Medical Record Number: [id number] Patient Account Number: [id number] Date of Birth/Sex: Mar 13, 1966 (55 y.o. M) Treating RN: Dolan Amen Primary Care Demarlo Riojas: Vidal Schwalbe Other Clinician: Referring Andera Cranmer: Vidal Schwalbe Treating Caven Perine/Extender: Skipper Cliche in Treatment: 31 Active Inactive Electronic Signature(s) Signed: 11/30/2020 5:06:40  PM By: Georges Mouse, Minus Breeding RN Entered By: Georges Mouse, Kenia on 11/30/2020 14:23:10 Ralph Peterson ([id number]) -------------------------------------------------------------------------------- Pain Assessment Details Patient Name: Ralph Peterson Date of Service: 11/30/2020 2:00 PM Medical Record Number: [id number] Patient Account Number: [id number] Date of Birth/Sex: May 29, 1966 (55 y.o. M) Treating RN: Donnamarie Poag Primary Care Nealie Mchatton: Vidal Schwalbe Other Clinician: Referring Amora Sheehy: Vidal Schwalbe Treating Halvor Behrend/Extender: Skipper Cliche in Treatment: 31 Active Problems Location of Pain Severity and Description of Pain Patient Has Paino No Site Locations Rate the pain. Current Pain Level: 0 Pain Management and Medication Current Pain Management: Electronic Signature(s) Signed: 12/01/2020 10:34:15 AM By: Donnamarie Poag Entered By: Donnamarie Poag on 11/30/2020 14:04:58 Ralph Peterson ([id number]) -------------------------------------------------------------------------------- Patient/Caregiver Education Details Patient Name:  Ralph Peterson Date of Service: 11/30/2020 2:00 PM Medical Record Number: [id number] Patient Account Number: [id number] Date of Birth/Gender: 03-14-1966 (55 y.o. M) Treating RN: Dolan Amen Primary Care Physician: Vidal Schwalbe Other Clinician: Referring Physician: Vidal Schwalbe Treating Physician/Extender: Skipper Cliche in Treatment: 6 Education Assessment Education Provided To: Patient Education Topics Provided Notes discharge education Electronic Signature(s) Signed: 11/30/2020 5:06:40 PM By: Georges Mouse, Minus Breeding RN Entered By: Georges Mouse, Minus Breeding on 11/30/2020 14:25:34 Ralph Peterson ([id number]) -------------------------------------------------------------------------------- [place] Details Patient Name: Ralph Peterson Date of Service: 11/30/2020 2:00 PM Medical Record Number: [id number] Patient Account Number: [id number] Date of Birth/Sex: [birth year] (55 y.o. M) Treating RN: Donnamarie Poag Primary Care Martin Belling: Vidal Schwalbe Other Clinician: Referring Taeya Theall: Vidal Schwalbe Treating Kaiyan Luczak/Extender: Skipper Cliche in Treatment: 31 Vital Signs Time Taken: 14:02 Temperature (F): 98 Height (in): 72 Pulse (bpm): 94 Weight (lbs): 225 Respiratory Rate (breaths/min): 16 Body Mass Index (BMI): 30.5 Blood Pressure (mmHg): 132/83 Reference Range: 80 - 120 mg / dl Electronic Signature(s) Signed: 12/01/2020 10:34:15 AM By: Donnamarie Poag Entered ByDonnamarie Poag on 11/30/2020 14:04:50

## 2020-12-07 ENCOUNTER — Encounter: Payer: Medicare PPO | Admitting: Physician Assistant

## 2020-12-14 ENCOUNTER — Encounter: Payer: Medicare PPO | Admitting: Internal Medicine

## 2021-01-09 ENCOUNTER — Ambulatory Visit: Payer: Medicare PPO | Admitting: Podiatry

## 2021-01-09 ENCOUNTER — Other Ambulatory Visit: Payer: Self-pay

## 2021-01-09 DIAGNOSIS — E0842 Diabetes mellitus due to underlying condition with diabetic polyneuropathy: Secondary | ICD-10-CM

## 2021-01-09 DIAGNOSIS — Z89421 Acquired absence of other right toe(s): Secondary | ICD-10-CM

## 2021-01-10 ENCOUNTER — Encounter: Payer: Self-pay | Admitting: Podiatry

## 2021-01-10 NOTE — Progress Notes (Signed)
Subjective:  Patient ID: Ralph Peterson, male    DOB: 05/03/1966,  MRN: [id number]  Chief Complaint  Patient presents with   Foot Ulcer    PT stated that he is doing great he has no concerns at this time He stated that he was released from wound care about a month ago      55 y.o. male returns for post-op check.  Patient states he has been discharged from wound care center.  His wound has completely epithelialized.  He is doing much better now.  He would like to discuss diabetic shoes and insoles with filler.  Review of Systems: Negative except as noted in the HPI. Denies N/V/F/Ch.  Past Medical History:  Diagnosis Date   Anxiety    Arrhythmia    atrial fibrillation   Atrial fibrillation (HCC)    CAD (coronary artery disease)    a. 09/2010 Cath/PCI ([facility]): LM nl, LAD 24m D1 80 (small), LCX 371mOM1 30, RI 70 (small), RCA 70 (DES).   CHF (congestive heart failure) (HCC)    Coronary artery disease    COVID-19 virus infection 07/2019   Diabetes mellitus without complication (HCC)    [place]   Hyperlipidemia    Hypertension    Ischemic cardiomyopathy    a.  12/2011 Echo ([facility]): NL EF, mod LVH. Mild AS/MS, triv PR/TR. 07/2019 Echo: EF 25-30%, GR1 DD, inf/post HK, low nl RV fxn, mildly dil RA, triv MR, mild Ao sclerosis w/o stenosis.   [place]STEMI (non-ST elevated [place] infarction) [place]   [place] artery disease) [place]   a. 03/2016 s/p PTA/DEB R SFA/popliteal/peroneal; b. 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 08/2017 Atherectomy/DEB dist L SFA/popliteal. PTA of L AT; d. 06/2018 PTA/DEB L SFA/popliteal/PT/AT; e. 01/2019 Stable ABIs.   Sleep apnea     Current Outpatient Medications:    albuterol (VENTOLIN HFA) 108 (90 Base) MCG/ACT inhaler, Inhale 2 puffs into the lungs every 6 (six) hours as needed for wheezing or shortness of breath., Disp: , Rfl:    apixaban (ELIQUIS) 5 MG TABS tablet, Take 1 tablet (5 mg total) by mouth 2 (two) times  daily., Disp: 60 tablet, Rfl: 3   atorvastatin (LIPITOR) 40 MG tablet, Take 40 mg by mouth at bedtime., Disp: , Rfl: 1   cinacalcet (SENSIPAR) 60 MG tablet, Take 60 mg by mouth at bedtime., Disp: , Rfl:    ciprofloxacin (CIPRO) 500 MG tablet, Take 1 tablet (500 mg total) by mouth 2 (two) times daily. (Patient not taking: Reported on 10/12/2020), Disp: 20 tablet, Rfl: 0   escitalopram (LEXAPRO) 10 MG tablet, Take 10 mg by mouth at bedtime., Disp: , Rfl:    fluticasone (FLONASE) 50 MCG/ACT nasal spray, Place 2 sprays into both nostrils 2 (two) times daily., Disp: , Rfl:    glipiZIDE (GLUCOTROL) 5 MG tablet, Take 5 mg by mouth daily., Disp: , Rfl:    insulin detemir (LEVEMIR) 100 UNIT/ML injection, Inject 0.05 mLs (5 Units total) into the skin daily. (Patient taking differently: Inject 5 Units into the skin at bedtime.), Disp: , Rfl:    loperamide (IMODIUM) 2 MG capsule, Take 2 mg by mouth daily as needed for diarrhea or loose stools., Disp: , Rfl:    metoprolol succinate (TOPROL-XL) 50 MG 24 hr tablet, Take 50 mg by mouth 2 (two) times daily., Disp: , Rfl:    montelukast (SINGULAIR) 10 MG tablet, Take 10 mg by mouth at bedtime., Disp: ,  Rfl:    omeprazole (PRILOSEC) 20 MG capsule, Take 20 mg by mouth at bedtime., Disp: , Rfl:    sevelamer carbonate (RENVELA) 800 MG tablet, Take 1,600-3,200 mg by mouth See admin instructions. Take 4 tablets ('3200mg'$ ) three times daily with meals and take 2 tablet (1600 mg) by mouth with snacks, Disp: , Rfl:    sulfamethoxazole-trimethoprim (BACTRIM DS) 800-160 MG tablet, Take by mouth., Disp: , Rfl:   Social History   Tobacco Use  Smoking Status Former   Pack years: 0.00   Types: Cigarettes   Quit date: 2010   Years since quitting: 12.4  Smokeless Tobacco Never    Allergies  Allergen Reactions   Oxycodone-Acetaminophen Itching   Gabapentin Other (See Comments)    unknown   Tramadol Other (See Comments)    unknown   Penicillins Swelling and Rash     Patient endorses itching/hives and tongue swelling with penicillin and amoxicillin at some point in the past.    Objective:   There were no vitals filed for this visit. There is no height or weight on file to calculate BMI. Constitutional Well developed. Well nourished.  Vascular Foot warm and well perfused. Capillary refill normal to all digits.   Neurologic Normal speech. Oriented to person, place, and time. Epicritic sensation to light touch grossly present bilaterally.  Dermatologic  skin completely reepithelialized.  No clinical signs of infection noted.  No wound noted.  No malodor present no probing notable noted.  Orthopedic: Tenderness to palpation noted about the surgical site.   Radiographs: None Assessment:   1. 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 Diabetes mellitus due to underlying condition with diabetic polyneuropathy, unspecified whether long term insulin use ([place])    Plan:  Patient was evaluated and treated and all questions answered.  S/p foot surgery right -Clinically healed the wound has completely epithelialized.  He can return to regular shoes/diabetic shoes. -His last diabetic shoes was obtained greater than 2 years ago and is in need for another pair of it.  Patient has a history of amputation.  He will need a filler for it.  He will be scheduled to see [facility] for diabetic shoes with a toe filler at the amputation site. -I will see him back as needed if any foot and ankle issues arise in the future.  He states understanding.  No follow-ups on file.

## 2021-01-16 ENCOUNTER — Other Ambulatory Visit: Payer: Medicare PPO

## 2021-04-13 ENCOUNTER — Other Ambulatory Visit: Payer: Self-pay

## 2021-04-13 ENCOUNTER — Ambulatory Visit (INDEPENDENT_AMBULATORY_CARE_PROVIDER_SITE_OTHER): Payer: Medicare PPO

## 2021-04-13 ENCOUNTER — Ambulatory Visit: Payer: Medicare PPO | Admitting: Podiatry

## 2021-04-13 ENCOUNTER — Encounter: Payer: Self-pay | Admitting: Podiatry

## 2021-04-13 DIAGNOSIS — E08621 Diabetes mellitus due to underlying condition with foot ulcer: Secondary | ICD-10-CM | POA: Diagnosis not present

## 2021-04-13 DIAGNOSIS — L97512 Non-pressure chronic ulcer of other part of right foot with fat layer exposed: Secondary | ICD-10-CM

## 2021-04-13 DIAGNOSIS — L720 Epidermal cyst: Secondary | ICD-10-CM

## 2021-04-13 MED ORDER — GENTAMICIN SULFATE 0.1 % EX CREA: 1.0000 "application " | TOPICAL_CREAM | Freq: Two times a day (BID) | CUTANEOUS | 1 refills | Status: AC

## 2021-04-13 MED ORDER — DOXYCYCLINE HYCLATE 100 MG PO TABS: 100.0000 mg | ORAL_TABLET | Freq: Two times a day (BID) | ORAL | 0 refills | Status: AC

## 2021-04-13 NOTE — Progress Notes (Signed)
Subjective:  55 y.o. male with PMHx of diabetes mellitus and history of prior amputations presenting today for evaluation of an ulcer to the plantar aspect of the right forefoot.  Ulcer has been present for 1-2 weeks now.  He states that it began as a blister on the bottom of the right foot.  He has been applying iodine and gauze with an offloading felt pad.  He presents for further treatment and evaluation    Past Medical History:  Diagnosis Date   Anxiety    Arrhythmia    atrial fibrillation   Atrial fibrillation (HCC)    CAD (coronary artery disease)    a. 09/2010 Cath/PCI ([facility]): LM nl, LAD 72m D1 80 (small), LCX 339mOM1 30, RI 70 (small), RCA 70 (DES).   CHF (congestive heart failure) (HCC)    Coronary artery disease    COVID-19 virus infection 07/2019   Diabetes mellitus without complication (HCC)    [place]   Hyperlipidemia    Hypertension    Ischemic cardiomyopathy    a.  12/2011 Echo ([facility]): NL EF, mod LVH. Mild AS/MS, triv PR/TR. 07/2019 Echo: EF 25-30%, GR1 DD, inf/post HK, low nl RV fxn, mildly dil RA, triv MR, mild Ao sclerosis w/o stenosis.   [place]STEMI (non-ST elevated myocardial infarction) [place]   PAD [place]peripheral artery disease) ([facility]   a. 03/2016 s/p PTA/DEB R SFA/popliteal/peroneal; b. 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 08/2017 Atherectomy/DEB dist L SFA/popliteal. PTA of L AT; d. 06/2018 PTA/DEB L SFA/popliteal/PT/AT; e. 01/2019 Stable ABIs.   Sleep apnea       Objective/Physical Exam General: The patient is alert and oriented x3 in no acute distress.  Dermatology:  Wound #1 noted to the plantar aspect of the right foot measuring 1.  5 x 1.5 x 0.2 cm (LxWxD).   To the noted ulceration(s), there is no eschar. There is a moderate amount of slough, fibrin, and necrotic tissue noted. Granulation tissue and wound base is red. There is a minimal amount of serosanguineous drainage noted. There is no exposed bone muscle-tendon  ligament or joint. There is no malodor. Periwound integrity is intact. Open tunneling wound noted to the dorsum of the right foot after the removal of the cystic lesion which I suspect is an epidermal inclusion cyst.  It was well encapsulated with some purulence.  Pulling this lesion the entire encapsulated tissue came out in its entirety.  No malodor.  Skin is warm, dry and supple bilateral lower extremities.  Vascular: Palpable pedal pulses bilaterally. No edema or erythema noted. Capillary refill within normal limits.  Neurological: Epicritic and protective threshold diminished bilaterally.   Musculoskeletal Exam: History of prior amputations second and third digit RT foot  Radiographic exam: History of prior partial ray amputations second and third right.  Osteotomy sites appear to have healed with stable healing.  No cortical irregularities.  Degenerative changes noted at the first MTP joint.  Hammertoe deformities 4 5 right. There is an area of radiolucency to the soft tissue around the second ray of the right foot at the area of the removal of the suspected epidermal inclusion cyst.  Removal of the cyst left of open wounds/avoid into the deep tissues of the foot.  This is not gas within the deep tissues.  Assessment: 1.  Ulcer right plantar forefoot secondary to diabetes mellitus 2.  Soft tissue mass right foot dorsal suspicious for possible epidermal inclusion cyst along the  incision site   Plan of Care:  1. Patient was evaluated. 2. medically necessary excisional debridement including subcutaneous tissue was performed using a tissue nipper and a chisel blade. Excisional debridement of all the necrotic nonviable tissue down to healthy bleeding viable tissue was performed with post-debridement measurements same as pre-. 3. the wound was cleansed and dry sterile dressing applied. 4.  The soft tissue lesion was removed in its entirety which was along the incision site of the right foot and  placed in a sterile specimen container and sent to pathology  5.  Prescription for gentamicin cream applied daily  6.  Prescription for doxycycline 100 mg 2 times daily #20  7.  Appointment with Pedorthist for new custom molded diabetic insoles and shoes  8.  Culture was taken today of the area that the cyst was removed and sent to pathology for culture and sensitivity  9.  Continue management at the [facility] wound care center.   10.  Offloading felt dancers pads were applied to the insoles of the diabetic shoe to offload pressure from the ulcer site  11.  Patient is to return to clinic in 4 weeks.   Edrick Kins, DPM [facility]  Dr. Edrick Kins, DPM    [address], [place] [address]                Office [phone]  Fax [phone]

## 2021-04-24 ENCOUNTER — Other Ambulatory Visit: Payer: Self-pay

## 2021-04-24 ENCOUNTER — Encounter: Payer: Medicare PPO | Attending: Physician Assistant | Admitting: Physician Assistant

## 2021-04-24 DIAGNOSIS — E1122 Type 2 diabetes mellitus with diabetic chronic kidney disease: Secondary | ICD-10-CM | POA: Insufficient documentation

## 2021-04-24 DIAGNOSIS — I132 Hypertensive heart and chronic kidney disease with heart failure and with stage 5 chronic kidney disease, or end stage renal disease: Secondary | ICD-10-CM | POA: Diagnosis not present

## 2021-04-24 DIAGNOSIS — Z992 Dependence on renal dialysis: Secondary | ICD-10-CM | POA: Diagnosis not present

## 2021-04-24 DIAGNOSIS — N186 End stage renal disease: Secondary | ICD-10-CM | POA: Insufficient documentation

## 2021-04-24 DIAGNOSIS — I5042 Chronic combined systolic (congestive) and diastolic (congestive) heart failure: Secondary | ICD-10-CM | POA: Diagnosis not present

## 2021-04-24 DIAGNOSIS — E1142 Type 2 diabetes mellitus with diabetic polyneuropathy: Secondary | ICD-10-CM | POA: Insufficient documentation

## 2021-04-24 DIAGNOSIS — Z7901 Long term (current) use of anticoagulants: Secondary | ICD-10-CM | POA: Diagnosis not present

## 2021-04-24 DIAGNOSIS — I480 Paroxysmal atrial fibrillation: Secondary | ICD-10-CM | POA: Diagnosis not present

## 2021-04-24 DIAGNOSIS — E11621 Type 2 diabetes mellitus with foot ulcer: Secondary | ICD-10-CM | POA: Insufficient documentation

## 2021-04-24 DIAGNOSIS — L97519 Non-pressure chronic ulcer of other part of right foot with unspecified severity: Secondary | ICD-10-CM | POA: Diagnosis present

## 2021-04-24 DIAGNOSIS — L97512 Non-pressure chronic ulcer of other part of right foot with fat layer exposed: Secondary | ICD-10-CM | POA: Diagnosis not present

## 2021-04-24 NOTE — Progress Notes (Signed)
DYMERE, GLUNT ([id number]) Visit Report for 04/24/2021 Allergy List Details Patient Name: Ralph Peterson. Date of Service: 04/24/2021 10:15 AM Medical Record Number: [id number] Patient Account Number: [id number] Date of Birth/Sex: 03-Jan-1966 (55 y.o. M) Treating RN: Donnamarie Poag Primary Care Charlton Boule: Vidal Schwalbe Other Clinician: Referring Adil Tugwell: Daylene Katayama Treating Edric Fetterman/Extender: Jeri Cos Weeks in Treatment: 0 Allergies Active Allergies penicillin Reaction: hives,rash,swelling Percocet Reaction: rash Severity: Moderate Allergy Notes Electronic Signature(s) Signed: 04/24/2021 2:08:39 PM By: Donnamarie Poag Entered By: Donnamarie Poag on 04/24/2021 10:44:16 Ralph Peterson ([id number]) -------------------------------------------------------------------------------- Arrival Information Details Patient Name: Ralph Peterson Date of Service: 04/24/2021 10:15 AM Medical Record Number: [id number] Patient Account Number: [id number] Date of Birth/Sex: [birth year] (55 y.o. M) Treating RN: Donnamarie Poag Primary Care Adley Mazurowski: Vidal Schwalbe Other Clinician: Referring Madsen Riddle: Daylene Katayama Treating Diamone Whistler/Extender: Skipper Cliche in Treatment: 0 Visit Information Patient Arrived: Ambulatory Arrival Time: 10:37 Accompanied By: self Transfer Assistance: None Patient Identification Verified: Yes Secondary Verification Process Completed: Yes Patient Has Alerts: Yes Patient Alerts: Patient on Blood Thinner Eliquis Diabetic AVVS 07/2020 ABI LandR=[place] History Since Last Visit Electronic Signature(s) Signed: 04/24/2021 2:08:39 PM By: Donnamarie Poag Entered By: Donnamarie Poag on 04/24/2021 11:00:46 Ralph Peterson ([id number]) -------------------------------------------------------------------------------- Clinic Level of Care Assessment Details Patient Name: Ralph Peterson. Date of Service: 04/24/2021 10:15 AM Medical Record Number:  [id number] Patient Account Number: [id number] Date of Birth/Sex: Nov 17, 1965 (55 y.o. M) Treating RN: Donnamarie Poag Primary Care Taylr Meuth: Vidal Schwalbe Other Clinician: Referring Berlie Hatchel: Daylene Katayama Treating Fedra Lanter/Extender: Skipper Cliche in Treatment: 0 Clinic Level of Care Assessment Items TOOL 1 Quantity Score '[]'$  - Use when EandM and Procedure is performed on INITIAL visit 0 ASSESSMENTS - Nursing Assessment / Reassessment X - General Physical Exam (combine w/ comprehensive assessment (listed just below) when performed on new 1 20 pt. evals) X- 1 25 Comprehensive Assessment (HX, ROS, Risk Assessments, Wounds Hx, etc.) ASSESSMENTS - Wound and Skin Assessment / Reassessment '[]'$  - Dermatologic / Skin Assessment (not related to wound area) 0 ASSESSMENTS - Ostomy and/or Continence Assessment and Care '[]'$  - Incontinence Assessment and Management 0 '[]'$  - 0 Ostomy Care Assessment and Management (repouching, etc.) PROCESS - Coordination of Care X - Simple Patient / Family Education for ongoing care 1 15 '[]'$  - 0 Complex (extensive) Patient / Family Education for ongoing care X- 1 10 Staff obtains Programmer, systems, Records, Test Results / Process Orders '[]'$  - 0 Staff telephones HHA, Nursing Homes / Clarify orders / etc '[]'$  - 0 Routine Transfer to another Facility (non-emergent condition) '[]'$  - 0 Routine Hospital Admission (non-emergent condition) X- 1 15 New Admissions / Biomedical engineer / Ordering NPWT, Apligraf, etc. '[]'$  - 0 Emergency Hospital Admission (emergent condition) PROCESS - Special Needs '[]'$  - Pediatric / Minor Patient Management 0 '[]'$  - 0 Isolation Patient Management '[]'$  - 0 Hearing / Language / Visual special needs '[]'$  - 0 Assessment of Community assistance (transportation, D/C planning, etc.) '[]'$  - 0 Additional assistance / Altered mentation '[]'$  - 0 Support Surface(s) Assessment (bed, cushion, seat, etc.) INTERVENTIONS - Miscellaneous '[]'$  - External ear exam 0 '[]'$  -  0 Patient Transfer (multiple staff / Civil Service fast streamer / Similar devices) '[]'$  - 0 Simple Staple / Suture removal (25 or less) '[]'$  - 0 Complex Staple / Suture removal (26 or more) '[]'$  - 0 Hypo/Hyperglycemic Management (do not check if billed separately) '[]'$  - 0 Ankle / Brachial Index (ABI) - do not check if billed separately Has the patient been seen at the  hospital within the last three years: Yes Total Score: 85 Level Of Care: New/Established - Level 3 Ralph Peterson, Ralph Peterson ([id number]) Electronic Signature(s) Signed: 04/24/2021 2:08:39 PM By: Donnamarie Poag Entered By: Donnamarie Poag on 04/24/2021 11:51:46 Ralph Peterson ([id number]) -------------------------------------------------------------------------------- Encounter Discharge Information Details Patient Name: Ralph Peterson Date of Service: 04/24/2021 10:15 AM Medical Record Number: [id number] Patient Account Number: [id number] Date of Birth/Sex: [birth year] (55 y.o. M) Treating RN: Donnamarie Poag Primary Care Maat Kafer: Vidal Schwalbe Other Clinician: Referring Mahad Newstrom: Daylene Katayama Treating Bransyn Adami/Extender: Skipper Cliche in Treatment: 0 Encounter Discharge Information Items Post Procedure Vitals Discharge Condition: Stable Temperature (F): 98.4 Ambulatory Status: Ambulatory Pulse (bpm): 84 Discharge Destination: Home Respiratory Rate (breaths/min): 16 Transportation: Private Auto Blood Pressure (mmHg): 131/76 Accompanied By: self Schedule Follow-up Appointment: Yes Clinical Summary of Care: Electronic Signature(s) Signed: 04/24/2021 2:08:39 PM By: Donnamarie Poag Entered By: Donnamarie Poag on 04/24/2021 11:53:08 Ralph Peterson ([id number]) -------------------------------------------------------------------------------- Lower Extremity Assessment Details Patient Name: Ralph Peterson. Date of Service: 04/24/2021 10:15 AM Medical Record Number: [id number] Patient Account Number: [id number] Date of  Birth/Sex: March 23, 1966 (55 y.o. M) Treating RN: Donnamarie Poag Primary Care Tawnee Clegg: Vidal Schwalbe Other Clinician: Referring Nattalie Santiesteban: Daylene Katayama Treating Jeremih Dearmas/Extender: Skipper Cliche in Treatment: 0 Edema Assessment Assessed: [Left: No] [Right: Yes] Edema: [Left: N] [Right: o] Calf Left: Right: Point of Measurement: 38 cm From Medial Instep 32.5 cm Ankle Left: Right: Point of Measurement: 11 cm From Medial Instep 20 cm Knee To Floor Left: Right: From Medial Instep 45 cm Vascular Assessment Pulses: Dorsalis Pedis Palpable: [Right:Yes] Electronic Signature(s) Signed: 04/24/2021 2:08:39 PM By: Donnamarie Poag Entered By: Donnamarie Poag on 04/24/2021 11:00:07 Ralph Peterson ([id number]) -------------------------------------------------------------------------------- Multi Wound Chart Details Patient Name: Ralph Peterson. Date of Service: 04/24/2021 10:15 AM Medical Record Number: [id number] Patient Account Number: [id number] Date of Birth/Sex: 1965/08/25 (55 y.o. M) Treating RN: Donnamarie Poag Primary Care Rea Reser: Vidal Schwalbe Other Clinician: Referring Keeanna Villafranca: Daylene Katayama Treating Terryl Molinelli/Extender: Skipper Cliche in Treatment: 0 Vital Signs Height(in): 72 Pulse(bpm): 17 Weight(lbs): 219 Blood Pressure(mmHg): 131/76 Body Mass Index(BMI): 30 Temperature(F): 98.4 Respiratory Rate(breaths/min): 16 Photos: [N/A:N/A] Wound Location: Right, Distal, Plantar Foot N/A N/A Wounding Event: Blister N/A N/A Primary Etiology: Diabetic Wound/Ulcer of the Lower N/A N/A Extremity Comorbid History: Arrhythmia, Congestive Heart N/A N/A Failure, Coronary Artery Disease, Hypertension, Myocardial Infarction, Peripheral Venous Disease, Type II Diabetes, Neuropathy Date Acquired: 04/10/2021 N/A N/A Weeks of Treatment: 0 N/A N/A Wound Status: Open N/A N/A Measurements L x W x D (cm) 0.8x1x0.2 N/A N/A Area (cm) : 0.628 N/A N/A Volume (cm) : 0.126 N/A  N/A Classification: Grade 2 N/A N/A Exudate Amount: Medium N/A N/A Exudate Type: Serosanguineous N/A N/A Exudate Color: red, brown N/A N/A Wound Margin: Thickened N/A N/A Granulation Amount: Large (67-100%) N/A N/A Granulation Quality: Red, Pink N/A N/A Necrotic Amount: Small (1-33%) N/A N/A Exposed Structures: Fat Layer (Subcutaneous Tissue): N/A N/A Yes Fascia: No Tendon: No Muscle: No Joint: No Bone: No Treatment Notes Electronic Signature(s) Signed: 04/24/2021 2:08:39 PM By: Donnamarie Poag Entered By: Donnamarie Poag on 04/24/2021 11:40:12 Ralph Peterson ([id number]) -------------------------------------------------------------------------------- Multi-Disciplinary Care Plan Details Patient Name: Ralph Peterson Date of Service: 04/24/2021 10:15 AM Medical Record Number: [id number] Patient Account Number: [id number] Date of Birth/Sex: Nov 24, 1965 (55 y.o. M) Treating RN: Donnamarie Poag Primary Care Ligaya Cormier: Vidal Schwalbe Other Clinician: Referring Soleia Badolato: Daylene Katayama Treating Christia Coaxum/Extender: Skipper Cliche in Treatment: 0 Active Inactive Wound/Skin Impairment Nursing Diagnoses: Impaired tissue integrity Knowledge deficit related to  smoking impact on wound healing Knowledge deficit related to ulceration/compromised skin integrity Goals: Patient/caregiver will verbalize understanding of skin care regimen Date Initiated: 04/24/2021 Target Resolution Date: 05/11/2021 Goal Status: Active Ulcer/skin breakdown will have a volume reduction of 30% by week 4 Date Initiated: 04/24/2021 Target Resolution Date: 05/24/2021 Goal Status: Active Ulcer/skin breakdown will have a volume reduction of 50% by week 8 Date Initiated: 04/24/2021 Target Resolution Date: 06/23/2021 Goal Status: Active Ulcer/skin breakdown will have a volume reduction of 80% by week 12 Date Initiated: 04/24/2021 Target Resolution Date: 07/24/2021 Goal Status: Active Ulcer/skin breakdown will heal  within 14 weeks Date Initiated: 04/24/2021 Target Resolution Date: 08/05/2021 Goal Status: Active Interventions: Assess patient/caregiver ability to obtain necessary supplies Assess patient/caregiver ability to perform ulcer/skin care regimen upon admission and as needed Assess ulceration(s) every visit Notes: Electronic Signature(s) Signed: 04/24/2021 2:08:39 PM By: Donnamarie Poag Entered By: Donnamarie Poag on 04/24/2021 11:38:08 Ralph Peterson ([id number]) -------------------------------------------------------------------------------- Pain Assessment Details Patient Name: Ralph Peterson. Date of Service: 04/24/2021 10:15 AM Medical Record Number: [id number] Patient Account Number: [id number] Date of Birth/Sex: 30-Nov-1965 (55 y.o. M) Treating RN: Donnamarie Poag Primary Care Glendy Barsanti: Vidal Schwalbe Other Clinician: Referring Mounir Skipper: Daylene Katayama Treating Stephaine Breshears/Extender: Skipper Cliche in Treatment: 0 Active Problems Location of Pain Severity and Description of Pain Patient Has Paino No Site Locations Rate the pain. Current Pain Level: 0 Pain Management and Medication Current Pain Management: Electronic Signature(s) Signed: 04/24/2021 2:08:39 PM By: Donnamarie Poag Entered By: Donnamarie Poag on 04/24/2021 10:39:51 Ralph Peterson ([id number]) -------------------------------------------------------------------------------- Patient/Caregiver Education Details Patient Name: Ralph Peterson Date of Service: 04/24/2021 10:15 AM Medical Record Number: [id number] Patient Account Number: [id number] Date of Birth/Gender: 06-10-1966 (55 y.o. M) Treating RN: Donnamarie Poag Primary Care Physician: Vidal Schwalbe Other Clinician: Referring Physician: Daylene Katayama Treating Physician/Extender: Skipper Cliche in Treatment: 0 Education Assessment Education Provided To: Patient Education Topics Provided Basic Hygiene: Offloading: Wound Debridement: Wound/Skin  Impairment: Electronic Signature(s) Signed: 04/24/2021 2:08:39 PM By: Donnamarie Poag Entered By: Donnamarie Poag on 04/24/2021 11:52:20 Ralph Peterson ([id number]) -------------------------------------------------------------------------------- Wound Assessment Details Patient Name: Ralph Peterson. Date of Service: 04/24/2021 10:15 AM Medical Record Number: [id number] Patient Account Number: [id number] Date of Birth/Sex: [birth year] (55 y.o. M) Treating RN: Donnamarie Poag Primary Care Bion Todorov: Vidal Schwalbe Other Clinician: Referring Sandor Arboleda: Daylene Katayama Treating Aarionna Germer/Extender: Skipper Cliche in Treatment: 0 Wound Status Wound Number: 10 Primary Diabetic Wound/Ulcer of the Lower Extremity Etiology: Wound Location: Right, Distal, Plantar Foot Wound Open Wounding Event: Blister Status: Date Acquired: 04/10/2021 Comorbid Arrhythmia, Congestive Heart Failure, Coronary Artery Weeks Of Treatment: 0 History: Disease, Hypertension, Myocardial Infarction, Peripheral Clustered Wound: No Venous Disease, Type II Diabetes, Neuropathy Photos Wound Measurements Length: (cm) 0.8 Width: (cm) 1 Depth: (cm) 0.2 Area: (cm) 0.628 Volume: (cm) 0.126 % Reduction in Area: % Reduction in Volume: Tunneling: No Undermining: No Wound Description Classification: Grade 2 Wound Margin: Thickened Exudate Amount: Medium Exudate Type: Serosanguineous Exudate Color: red, brown Foul Odor After Cleansing: No Slough/Fibrino Yes Wound Bed Granulation Amount: Large (67-100%) Exposed Structure Granulation Quality: Red, Pink Fascia Exposed: No Necrotic Amount: Small (1-33%) Fat Layer (Subcutaneous Tissue) Exposed: Yes Necrotic Quality: Adherent Slough Tendon Exposed: No Muscle Exposed: No Joint Exposed: No Bone Exposed: No Treatment Notes Wound #10 (Foot) Wound Laterality: Plantar, Right, Distal Cleanser Soap and Water Discharge Instruction: Gently cleanse wound with antibacterial  soap, rinse and pat dry prior to dressing wounds Ralph Peterson, Ralph S. ([id number]) Peri-Wound Care Topical Primary Dressing Hydrofera Blue Ready  Transfer Foam, 2.5x2.5 (in/in) Discharge Instruction: Apply Hydrofera Blue Ready to wound bed as directed Secondary Dressing ABD Pad 5x9 (in/in) Discharge Instruction: Cover with ABD pad Secured With 56M [address] Surgical Tape, 2x2 (in/yd) Kerlix Roll Sterile or Non-Sterile 6-ply 4.5x4 (yd/yd) Discharge Instruction: Apply Kerlix as directed Compression Wrap Compression Stockings Add-Ons Electronic Signature(s) Signed: 04/24/2021 2:08:39 PM By: Donnamarie Poag Entered By: Donnamarie Poag on 04/24/2021 10:53:55 Ralph Peterson ([id number]) -------------------------------------------------------------------------------- [place] Details Patient Name: Ralph Peterson. Date of Service: 04/24/2021 10:15 AM Medical Record Number: [id number] Patient Account Number: [id number] Date of Birth/Sex: [birth year] (55 y.o. M) Treating RN: Donnamarie Poag Primary Care Nefertiti Mohamad: Vidal Schwalbe Other Clinician: Referring Vinnie Gombert: Daylene Katayama Treating Jeffree Cazeau/Extender: Skipper Cliche in Treatment: 0 Vital Signs Time Taken: 10:40 Temperature (F): 98.4 Height (in): 72 Pulse (bpm): 84 Source: Stated Respiratory Rate (breaths/min): 16 Weight (lbs): 219 Blood Pressure (mmHg): 131/76 Source: Stated Reference Range: 80 - 120 mg / dl Body Mass Index (BMI): 29.7 Notes STATED LAST DIALYSIS WEIGHT 108/3/22 WAS 102.5KG Electronic Signature(s) Signed: 04/24/2021 2:08:39 PM By: Donnamarie Poag Entered ByDonnamarie Poag on 04/24/2021 10:43:30

## 2021-04-24 NOTE — Progress Notes (Signed)
Ralph Peterson, Ralph Peterson ([id number]) Visit Report for 04/24/2021 Chief Complaint Document Details Patient Name: Ralph Peterson, Ralph Peterson. Date of Service: 04/24/2021 10:15 AM Medical Record Number: [id number] Patient Account Number: [id number] Date of Birth/Sex: May 16, 1966 (55 y.o. 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 M) Treating RN: Donnamarie Poag Primary Care Provider: Vidal Schwalbe Other Clinician: Referring Provider: Daylene Katayama Treating Provider/Extender: Skipper Cliche in Treatment: 0 Debridement Performed for Wound #10 Right,Distal,Plantar Foot Assessment: Performed By: Physician Tommie Sams., PA-C Debridement Type: Debridement Severity of Tissue Pre Debridement: Fat layer exposed Level of Consciousness (Pre- Awake and Alert procedure): Pre-procedure Verification/Time Out Yes - 11:41 Taken: Start Time: 11:41 Pain Control: Lidocaine Total Area Debrided (L x W): 1 (cm) x 1.1 (cm) = 1.1 (cm) Tissue and other material Viable, Non-Viable, Callus, Slough, Subcutaneous, Slough debrided: Level: Skin/Subcutaneous Tissue Debridement Description: Excisional Instrument: Curette Bleeding: Moderate Hemostasis Achieved: Pressure Response to Treatment: Procedure was  tolerated well Level of Consciousness (Post- Awake and Alert procedure): Post Debridement Measurements of Total Wound Length: (cm) 0.8 Width: (cm) 1 Depth: (cm) 0.2 Volume: (cm) 0.126 Character of Wound/Ulcer Post Debridement: Improved Severity of Tissue Post Debridement: Fat layer exposed Post Procedure Diagnosis Same as Pre-procedure Electronic Signature(s) Signed: 04/24/2021 2:08:39 PM By: Donnamarie Poag Signed: 04/24/2021 5:40:02 PM By: Worthy Keeler PA-C Entered By: Donnamarie Poag on 04/24/2021 11:44:39 Ralph Peterson ([id number]) -------------------------------------------------------------------------------- HPI Details Patient Name: Ralph Peterson. Date of Service: 04/24/2021 10:15 AM Medical Record Number: [id number] Patient Account Number: [id number] Date of Birth/Sex: March 21, 1966 (55 y.o. M) Treating RN: Donnamarie Poag Primary Care Provider: Vidal Schwalbe Other Clinician: Referring Provider: Daylene Katayama Treating Provider/Extender: Skipper Cliche in Treatment: 0 History of Present Illness HPI Description: 08/17/2020 upon evaluation today patient actually appears to be doing decently well today which is good news in regard to the dorsal wound which is almost healed and the lateral wound also doing much better. With regard to the plantar wound this is still draining purulent drainage unfortunately the culture did reveal that he had Pseudomonas as well as Enterococcus. With that being said he is allergic to penicillin which took away the only oral medication for the Enterococcus I did look at linezolid as a possibility but unfortunately there were medication interactions. Also the same was true the Cipro where there were medication interactions. Nonetheless I want to see if we can get him into infectious disease to see if they can work with him to try to find possibly add at home IV antibiotic regimen that can help out at this point. No sharp debridement is can be  necessary today. 09/07/2020 upon evaluation today patient appears to be doing unfortunately not too well in regard to his wound. He has been tolerating the dressing changes without complication. Fortunately there is no sign of active infection at this time. No fevers, chills, nausea, vomiting, or diarrhea. The patient did have a repeat surgery going to clean out the abscess and try to work on getting things moving  in a better direction about a week and a half ago. He still has sutures in place. With that being said unfortunately he does not appear to be showing signs of a lot of improvement in my opinion. He does have sutures on the plantar aspect still in place and on the dorsal aspect he has a small opening which actually probes all the way down through and actually came out of the plantar aspect. I feel like that he really is having a lot of issues here and I am afraid that if we do not get this taken care of this is can end up with him having a much larger and more extensive amputation than what he was hoping to have to deal with with this. It has already been since June 2021 but has been dealing with this including all the time since the amputation. 09/14/2020 upon evaluation today patient appears to be doing really about the same in regard to the wound on the dorsal surface of his foot. The plantar foot unfortunately still has sutures in place I really cannot see how things are doing in that regard. With the dorsal foot however this appears to show signs of still having a small opening in the distal aspect which does actually probe down to bone there is also significant purulent drainage we did actually obtain a wound culture today to further evaluate the issue here. 09/21/2020 upon evaluation today patient appears to be doing about the same in regard to his wound on the dorsal surface of his foot and the plantar foot he still does not have the sutures out at this point. That should hopefully happen  in the next week. With that being said the main issue is he continues to have drainage here. It was noted that he had positive findings on culture for Enterobacter. With that being said he cannot take oral penicillin he could do vancomycin and I think that is good to be the ideal thing this to see if we can get him started on IV vancomycin at dialysis. We will get a get in touch with them to try to see if we can coordinate that being done there at the dialysis center. I think that is the ideal situation at this point I would probably start with just 2 weeks of therapy. 09/28/2020 upon evaluation today patient appears to be doing well with regard to his wounds in general. He also has back on the vancomycin after the culture results and this seems to be doing excellent for him. All the wounds are showing signs of good improvement. No fevers, chills, nausea, vomiting, or diarrhea. 10/05/2020 upon evaluation today patient appears to actually be doing excellent in regard to his foot. Fortunately there is a lot of signs of new granulation epithelization at this point. There does not appear to be any evidence of active infection which is great news and overall very pleased with where things stand. In fact the open wound along the plantar aspect of his foot as well as the dorsal part of his foot has dramatically improved since we went forward with the vancomycin and requested that the dialysis center provide this to him by way of IV antibiotics with his dialysis. Overall I think that has made a dramatic improvement and overall his wound seems to be doing significantly better at this point. No fevers, chills, nausea, vomiting, or diarrhea. The patient was set to have his second opinion appointment with podiatry [facility] next week but to be  perfectly honest I am not even certain that that is necessary at this point I do not think there is much that they would be able to do for him nor that he really would want them to  do considering how well things look at this time. 10/12/2020 on evaluation today patient appears to be doing excellent in regard to his wounds. In fact the only issue I see is that everything is dried out as far as the collagen is concerned. I do think we can try to keep this moist he will continue to make excellent progress as he has been. Overall I am extremely pleased with what I see today. 10/19/2020 upon evaluation today patient appears to be doing decently well in regard to the wounds on his foot he is can require some debridement in this location. Unfortunately is having some issues with blistering over the leg which is of greater concern at this point. Fortunately I do not see any signs of active infection which is great news. Nonetheless the blistering may just be related to venous insufficiency or subsequently could potentially be an issue with overall worsening with regard to an infection but again it is not really hot to touch. 10/26/2020 upon evaluation today patient appears to be doing well with regard to the majority of his wounds. Unfortunately the dorsal surface of his foot does not appear to be doing quite as well today compared to what it has been doing. I do think that we will need to address that currently. Everything else is showing signs of improvement. He did not get the prescription that I gave him previous as unfortunately the transmission failed. I did not know that until today when the patient mention to me that he never got the antibiotic. 11/02/20 upon evaluation today patient appears to be doing much better in regard to his wounds in general. I feel like that he is making good progress and even the top of the foot does not appear to be doing too poorly at this time. He did finally get the antibiotics which is good. Unfortunately it does not look like I am able to electronically sent to his pharmacy it kept being an error in transmission every time this was sent. We finally  decided to call it in. Nonetheless overall I think he is headed in the right direction based on what I am seeing. 11/09/2020 upon evaluation today patient appears to be doing well in general in regard to his wounds. I think that he is making some progress here. With that being said there is definitely still some areas of concern. Specifically the dorsal aspect of his foot distally and the lateral aspect of his foot proximally. Both of these areas are still open everything else appears to likely be closed today based on what I am seeing. 4/28; the patient really looks quite good. The area over the base of the right fifth metatarsal and the base of the fifth MTP both look healed to me. 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 They are using Hydrofera Blue rope but I think this is far too small wound now for this 11/23/2020 upon evaluation today patient actually appears to be doing excellent. In fact I do not really see anything that is actually open anywhere at this point. With that being said I am a little reluctant to completely just healing out and  discharge him based on the dorsal foot wound being closed even though I do not see anything open we have been there down this road before where things would reopen causing additional problems. Nonetheless I think that we need to monitor him a little bit more closely to make sure nothing worsens again. 11/30/2020 upon evaluation today patient actually appears to be doing quite well in regard to his wound at this point. He has been tolerating the dressing changes without complication which is great news and overall I am extremely pleased with where things stand today. No fevers, chills, nausea, vomiting, or diarrhea. Readmission: 04/24/2021 upon evaluation today patient presents for reevaluation here in the clinic this is a patient that I have  previously taken care of when he had surgical wounds that had to be healed and this was several months back he in fact healed I believe in May 2022. Subsequently he has an issue now with his foot as he has been walking around more and this has caused an abnormal pressure location. This again is on the foot where he had all the surgery previous. That is on the right plantar foot. With that being said unfortunately the pati having a significant issue here with pressure ent does seem to be which I think is not can be the easiest thing to manage due to the shape of his foot. He is supposed to be getting new diabetic shoes fitted but right now he still has the ones currently that he has been you been using previously. Otherwise none of his major medical problems have shifted at this point. Electronic Signature(s) Signed: 04/24/2021 5:14:47 PM By: Worthy Keeler PA-C Entered By: Worthy Keeler on 04/24/2021 17:14:47 Ralph Peterson ([id number]) -------------------------------------------------------------------------------- Physical Exam Details Patient Name: Ralph Peterson Date of Service: 04/24/2021 10:15 AM Medical Record Number: [id number] Patient Account Number: [id number] Date of Birth/Sex: 10-25-1965 (55 y.o. M) Treating RN: Donnamarie Poag Primary Care Provider: Vidal Schwalbe Other Clinician: Referring Provider: Daylene Katayama Treating Provider/Extender: Skipper Cliche in Treatment: 0 Constitutional sitting or standing blood pressure is within target range for patient.. pulse regular and within target range for patient.Marland Kitchen respirations regular, non- labored and within target range for patient.Marland Kitchen temperature within target range for patient.. Well-nourished and well-hydrated in no acute distress. Eyes conjunctiva clear no eyelid edema noted. pupils equal round and reactive to light and accommodation. Ears, Nose, Mouth, and Throat no gross abnormality of ear auricles or external  auditory canals. normal hearing noted during conversation. mucus membranes moist. Respiratory normal breathing without difficulty. Cardiovascular 2+ dorsalis pedis/posterior tibialis pulses. no clubbing, cyanosis, significant edema, <3 sec cap refill. Musculoskeletal normal gait and posture. no significant deformity or arthritic changes, no loss or range of motion, no clubbing. Psychiatric this patient is able to make decisions and demonstrates good insight into disease process. Alert and Oriented x 3. pleasant and cooperative. Notes Upon inspection patient's wound bed actually showed signs of granulation in the base of the wound although he did have some callus around the edges that is going require some cleaning. This is definitely an open wound at this point and that something that we will have to keep a close eye on going forward. I think that he may benefit from a total contact cast in fact that might be the only thing to really get this closed. He is going to need to have that done before he can be refitted for new diabetic shoes in my opinion. Normally they need  the wounds to be completely healed before that is possible. Therefore Sivan and greater importance for [place] to get this done as quickly as possible. He is on a blood thinner. This is Eliquis. Electronic Signature(s) Signed: 04/24/2021 5:16:17 PM By: Worthy Keeler PA-C Entered By: Worthy Keeler on 04/24/2021 17:16:16 Ralph Peterson ([id number]) -------------------------------------------------------------------------------- Physician Orders Details Patient Name: Ralph Peterson Date of Service: 04/24/2021 10:15 AM Medical Record Number: [id number] Patient Account Number: [id number] Date of Birth/Sex: [birth year] (55 y.o. M) Treating RN: Donnamarie Poag Primary Care Provider: Vidal Schwalbe Other Clinician: Referring Provider: Daylene Katayama Treating Provider/Extender: Skipper Cliche in Treatment: 0 Verbal / Phone  Orders: No Diagnosis Coding ICD-10 Coding Code Description E11.621 Type 2 diabetes mellitus with foot ulcer 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d I48.0 Paroxysmal atrial fibrillation Z79.01 Long term (current) use of anticoagulants I50.42 Chronic combined systolic (congestive) and diastolic (congestive) heart failure 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 Bathing/ Shower/ Hygiene o May shower; gently cleanse wound with antibacterial soap, rinse and pat dry prior to dressing wounds - keep dressing dry/change after bathing o No tub bath. Off-Loading o Other: - offload insert from [facility] Medications-Please add to medication list. o P.O. Antibiotics - Finish the Cipro as ordered by Dr Amalia Hailey Wound Treatment Wound #10 - Foot Wound Laterality: Plantar, Right, Distal Cleanser: Soap and Water Every Other Day/15 Days Discharge Instructions: Gently cleanse wound with antibacterial soap, rinse and pat dry prior to dressing wounds Primary Dressing: Hydrofera Blue Ready Transfer Foam, 2.5x2.5 (in/in) (DME) (Generic) Every Other Day/15 Days Discharge Instructions: Apply Hydrofera Blue Ready to wound bed as directed Secondary Dressing: ABD Pad 5x9 (in/in) Every Other Day/15 Days Discharge Instructions: Cover with ABD pad Secured With: 28M Medipore H Soft Cloth Surgical Tape, 2x2 (in/yd) Every Other Day/15 Days Secured With: [employer] Sterile or Non-Sterile 6-ply 4.5x4 (yd/yd) (DME) (Generic) Every Other Day/15 Days Discharge Instructions: Apply Kerlix as directed Electronic Signature(s) Signed: 04/24/2021 2:08:39 PM By: Donnamarie Poag Signed: 04/24/2021 5:40:02 PM By: Worthy Keeler PA-C Entered By: Donnamarie Poag on 04/24/2021 11:51:17 Ralph Peterson ([id number]) -------------------------------------------------------------------------------- Problem  List Details Patient Name: Ralph Peterson. Date of Service: 04/24/2021 10:15 AM Medical Record Number: [id number] Patient Account Number: [id number] Date of Birth/Sex: [birth year] (55 y.o. M) Treating RN: Donnamarie Poag Primary Care Provider: Vidal Schwalbe Other Clinician: Referring Provider: Daylene Katayama Treating Provider/Extender: Skipper Cliche in Treatment: 0 Active Problems ICD-10 Encounter Code Description Active Date MDM Diagnosis E11.621 Type 2 diabetes mellitus with foot ulcer 04/24/2021 No Yes L97.512 Non-pressure chronic ulcer of other part of right foot with fat layer 04/24/2021 No Yes exposed I48.0 Paroxysmal atrial fibrillation 04/24/2021 No Yes Z79.01 Long term (current) use of anticoagulants 04/24/2021 No Yes I50.42 Chronic combined systolic (congestive) and diastolic (congestive) heart 04/24/2021 No Yes failure N18.6 End stage renal disease 04/24/2021 No Yes Z99.2 Dependence on renal dialysis 04/24/2021 No Yes Inactive Problems Resolved Problems Electronic Signature(s) Signed: 04/24/2021 11:34:28 AM By: Worthy Keeler PA-C Entered By: Worthy Keeler on 04/24/2021 11:34:28 Ralph Peterson ([id number]) -------------------------------------------------------------------------------- Progress Note Details Patient Name: Ralph Peterson. Date of Service: 04/24/2021 10:15 AM Medical Record Number: [id number] Patient Account Number: [id number] Date of Birth/Sex: 1966-07-09 (55 y.o. 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 With regard to the plantar wound this is still draining purulent drainage unfortunately the culture did reveal that he had Pseudomonas as well as Enterococcus. With that being said he is allergic to penicillin which took away the only oral medication for the Enterococcus I did look at linezolid as a possibility but unfortunately there were medication interactions. Also the same was true the Cipro where there were medication interactions. Nonetheless I want to see if we can get him into infectious disease to see if they can work with him to try to find possibly add at home IV antibiotic regimen that can help out at this point. No sharp debridement is can be necessary today. 09/07/2020 upon evaluation today patient appears to be doing unfortunately not too well in regard to his wound. He has been tolerating the dressing changes without complication. Fortunately there is no sign of active infection at this time. No fevers, chills, nausea, vomiting, or diarrhea. The patient did have a repeat surgery going to clean out the abscess and try to work on getting things moving in a better direction about a week and a half ago. He still has sutures in place. With that being said unfortunately he does not appear to be showing signs of a lot of improvement in my opinion. He does have sutures on the plantar aspect still in place and on the dorsal aspect he has a small opening which actually probes all the way down through and actually came out of the plantar aspect. I feel like that he really is having a lot of issues here and I am afraid that if we do not get this taken care of this is can end up with him having a much larger and more extensive amputation than what he was hoping to have to deal with with this. It has already been since June 2021 but has been dealing with this including all the time since the amputation. 09/14/2020 upon  evaluation today patient appears to be doing really about the same in regard to the wound on the dorsal surface of his foot. The plantar foot unfortunately still has sutures in place I really cannot see how things are doing in that regard. With the dorsal foot however this appears to show signs of still having a small opening in the distal aspect which does actually probe down to bone there is also significant purulent drainage we did actually obtain a wound culture today to further evaluate the issue here. 09/21/2020 upon evaluation today patient appears to be doing about the same in regard to his wound on the dorsal surface of his foot and the plantar foot he still does not have the sutures out at this point. That should hopefully happen in the next week. With that being said the main issue is he continues to have drainage here. It was noted that he had positive findings on culture for Enterobacter. With that being said he cannot take oral penicillin he could do vancomycin and I think that is good to be the ideal thing this to see if we can get him started on IV vancomycin at dialysis. We will get  a get in touch with them to try to see if we can coordinate that being done there at the dialysis center. I think that is the ideal situation at this point I would probably start with just 2 weeks of therapy. 09/28/2020 upon evaluation today patient appears to be doing well with regard to his wounds in general. He also has back on the vancomycin after the culture results and this seems to be doing excellent for him. All the wounds are showing signs of good improvement. No fevers, chills, nausea, vomiting, or diarrhea. 10/05/2020 upon evaluation today patient appears to actually be doing excellent in regard to his foot. Fortunately there is a lot of signs of new granulation epithelization at this point. There does not appear to be any evidence of active infection which is great news and overall very  pleased with where things stand. In fact the open wound along the plantar aspect of his foot as well as the dorsal part of his foot has dramatically improved since we went forward with the vancomycin and requested that the dialysis center provide this to him by way of IV antibiotics with his dialysis. Overall I think that has made a dramatic improvement and overall his wound seems to be doing significantly better at this point. No fevers, chills, nausea, vomiting, or diarrhea. The patient was set to have his second opinion appointment with podiatry [facility] next week but to be perfectly honest I am not even certain that that is necessary at this point I do not think there is much that they would be able to do for him nor that he really would want them to do considering how well things look at this time. 10/12/2020 on evaluation today patient appears to be doing excellent in regard to his wounds. In fact the only issue I see is that everything is dried out as far as the collagen is concerned. I do think we can try to keep this moist he will continue to make excellent progress as he has been. Overall I am extremely pleased with what I see today. 10/19/2020 upon evaluation today patient appears to be doing decently well in regard to the wounds on his foot he is can require some debridement in this location. Unfortunately is having some issues with blistering over the leg which is of greater concern at this point. Fortunately I do not see any signs of active infection which is great news. Nonetheless the blistering may just be related to venous insufficiency or subsequently could potentially be an issue with overall worsening with regard to an infection but again it is not really hot to touch. 10/26/2020 upon evaluation today patient appears to be doing well with regard to the majority of his wounds. Unfortunately the dorsal surface of his foot does not appear to be doing quite as well today compared to what it  has been doing. I do think that we will need to address that currently. Everything else is showing signs of improvement. He did not get the prescription that I gave him previous as unfortunately the transmission failed. I did not know that until today when the patient mention to me that he never got the antibiotic. 11/02/20 upon evaluation today patient appears to be doing much better in regard to his wounds in general. I feel like that he is making good progress and even the top of the foot does not appear to be doing too poorly at this time. He did finally get the antibiotics which  is good. Unfortunately it does not look like I am able to electronically sent to his pharmacy it kept being an error in transmission every time this was sent. We finally decided to call it in. Nonetheless overall I think he is headed in the right direction based on what I am seeing. Ralph Peterson, Ralph Peterson ([id number]) 11/09/2020 upon evaluation today patient appears to be doing well in general in regard to his wounds. I think that he is making some progress here. With that being said there is definitely still some areas of concern. Specifically the dorsal aspect of his foot distally and the lateral aspect of his foot proximally. Both of these areas are still open everything else appears to likely be closed today based on what I am seeing. 4/28; the patient really looks quite good. The area over the base of the right fifth metatarsal and the base of the fifth MTP both look healed to me. 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 They are using Hydrofera Blue rope but I think this is far too small wound now for this 11/23/2020 upon evaluation today patient actually appears to be doing excellent. In fact I do not really see anything that is actually open anywhere at this point. With that being said I am a little reluctant to completely just healing out  and discharge him based on the dorsal foot wound being closed even though I do not see anything open we have been there down this road before where things would reopen causing additional problems. Nonetheless I think that we need to monitor him a little bit more closely to make sure nothing worsens again. 11/30/2020 upon evaluation today patient actually appears to be doing quite well in regard to his wound at this point. He has been tolerating the dressing changes without complication which is great news and overall I am extremely pleased with where things stand today. No fevers, chills, nausea, vomiting, or diarrhea. Readmission: 04/24/2021 upon evaluation today patient presents for reevaluation here in the clinic this is a patient that I have previously taken care of when he had surgical wounds that had to be healed and this was several months back he in fact healed I believe in May 2022. Subsequently he has an issue now with his foot as he has been walking around more and this has caused an abnormal pressure location. This again is on the foot where he had all the surgery previous. That is on the right plantar foot. With that being said unfortunately the pati having a significant issue here with pressure ent does seem to be which I think is not can be the easiest thing to manage due to the shape of his foot. He is supposed to be getting new diabetic shoes fitted but right now he still has the ones currently that he has been you been using previously. Otherwise none of his major medical problems have shifted at this point. Patient History Information obtained from Patient. Allergies penicillin (Reaction: hives,rash,swelling), Percocet (Severity: Moderate, Reaction: rash) Family History Cancer - Father, Diabetes - Father, Hypertension - Mother,Child, Stroke - Mother, No family history of Heart Disease, Hereditary Spherocytosis, Kidney Disease, Lung Disease, Seizures, Thyroid Problems,  Tuberculosis. Social History Former smoker - ended on 07/22/2008, Marital Status - Divorced, Alcohol Use - Rarely, Drug Use - No History, Caffeine Use - Daily. Medical History Hematologic/Lymphatic Denies history of Anemia, Hemophilia, Human Immunodeficiency  Virus, Lymphedema, Sickle Cell Disease Cardiovascular Patient has history of Arrhythmia, Congestive Heart Failure, Coronary Artery Disease, Hypertension, Myocardial Infarction, Peripheral Venous Disease Denies history of Angina, Deep Vein Thrombosis, Phlebitis, Vasculitis Endocrine Patient has history of Type II Diabetes - IDDM x 24 years Neurologic Patient has history of Neuropathy Denies history of Dementia, Quadriplegia, Paraplegia, Seizure Disorder Patient is treated with Controlled Diet, Oral Agents. Blood sugar is tested. Blood sugar results noted at the following times: Breakfast - 83. Review of Systems (ROS) Constitutional Symptoms (General Health) Denies complaints or symptoms of Fatigue, Fever, Chills, Marked Weight Change. Eyes Complains or has symptoms of Glasses / Contacts. Ear/Nose/Mouth/Throat Denies complaints or symptoms of Difficult clearing ears, Sinusitis. Hematologic/Lymphatic Denies complaints or symptoms of Bleeding / Clotting Disorders, Human Immunodeficiency Virus. Respiratory Denies complaints or symptoms of Chronic or frequent coughs, Shortness of Breath. Gastrointestinal Denies complaints or symptoms of Frequent diarrhea, Nausea, Vomiting. Genitourinary Complains or has symptoms of Kidney failure/ Dialysis. Immunological Denies complaints or symptoms of Hives, Itching. Musculoskeletal Denies complaints or symptoms of Muscle Pain, Muscle Weakness. Ralph Peterson, Ralph Peterson ([id number]) Objective Constitutional sitting or standing blood pressure is within target range for patient.. pulse regular and within target range for patient.Marland Kitchen respirations regular, non- labored and within target range for patient.Marland Kitchen  temperature within target range for patient.. Well-nourished and well-hydrated in no acute distress. Vitals Time Taken: 10:40 AM, Height: 72 in, Source: Stated, Weight: 219 lbs, Source: Stated, BMI: 29.7, Temperature: 98.4 F, Pulse: 84 bpm, Respiratory Rate: 16 breaths/min, Blood Pressure: 131/76 mmHg. General Notes: STATED LAST DIALYSIS WEIGHT 108/3/22 WAS 102.5KG Eyes conjunctiva clear no eyelid edema noted. pupils equal round and reactive to light and accommodation. Ears, Nose, Mouth, and Throat no gross abnormality of ear auricles or external auditory canals. normal hearing noted during conversation. mucus membranes moist. Respiratory normal breathing without difficulty. Cardiovascular 2+ dorsalis pedis/posterior tibialis pulses. no clubbing, cyanosis, significant edema, Musculoskeletal normal gait and posture. no significant deformity or arthritic changes, no loss or range of motion, no clubbing. Psychiatric this patient is able to make decisions and demonstrates good insight into disease process. Alert and Oriented x 3. pleasant and cooperative. General Notes: Upon inspection patient's wound bed actually showed signs of granulation in the base of the wound although he did have some callus around the edges that is going require some cleaning. This is definitely an open wound at this point and that something that we will have to keep a close eye on going forward. I think that he may benefit from a total contact cast in fact that might be the only thing to really get this closed. He is going to need to have that done before he can be refitted for new diabetic shoes in my opinion. Normally they need the wounds to be completely healed before that is possible. Therefore Sivan and greater importance for [place] to get this done as quickly as possible. He is on a blood thinner. This is Eliquis. Integumentary (Hair, Skin) Wound #10 status is Open. Original cause of wound was Blister. The date  acquired was: 04/10/2021. The wound is located on the [place]. The wound measures 0.8cm length x 1cm width x 0.2cm depth; 0.628cm^2 area and 0.126cm^3 volume. There is Fat Layer (Subcutaneous Tissue) exposed. There is no tunneling or undermining noted. There is a medium amount of serosanguineous drainage noted. The wound margin is thickened. There is large (67-100%) red, pink granulation within the wound bed. There is a small (1-33%) amount of necrotic tissue  within the wound bed including Adherent Slough. Assessment Active Problems ICD-10 Type 2 diabetes mellitus with foot ulcer Non-pressure chronic ulcer of other part of right foot with fat layer exposed Paroxysmal atrial fibrillation Long term (current) use of anticoagulants Chronic combined systolic (congestive) and diastolic (congestive) heart failure End stage renal disease Dependence on renal dialysis Procedures Wound #10 Pre-procedure diagnosis of Wound #10 is a Diabetic Wound/Ulcer of the Lower Extremity located on the Right,Distal,Plantar Foot .Severity of Tissue Pre Debridement is: Fat layer exposed. There was a Excisional Skin/Subcutaneous Tissue Debridement with a total area of 1.1 sq cm performed by Tommie Sams., PA-C. With the following instrument(s): Curette to remove Viable and Non-Viable tissue/material. Material removed includes Callus, Subcutaneous Tissue, and Slough after achieving pain control using Lidocaine. A time out was conducted at 11:41, prior to the start of the procedure. A Moderate amount of bleeding was controlled with Pressure. The procedure was tolerated well. Post Debridement Ralph Peterson, Ralph Peterson. ([id number]) Measurements: 0.8cm length x 1cm width x 0.2cm depth; 0.126cm^3 volume. Character of Wound/Ulcer Post Debridement is improved. Severity of Tissue Post Debridement is: Fat layer exposed. Post procedure Diagnosis Wound #10: Same as Pre-Procedure Plan Follow-up Appointments: Return  Appointment in 1 week. Bathing/ Shower/ Hygiene: May shower; gently cleanse wound with antibacterial soap, rinse and pat dry prior to dressing wounds - keep dressing dry/change after bathing No tub bath. Off-Loading: Other: - offload insert from [facility] Medications-Please add to medication list.: P.O. Antibiotics - Finish the Cipro as ordered by Dr Amalia Hailey WOUND #10: - Foot Wound Laterality: Plantar, Right, Distal Cleanser: Soap and Water Every Other Day/15 Days Discharge Instructions: Gently cleanse wound with antibacterial soap, rinse and pat dry prior to dressing wounds Primary Dressing: Hydrofera Blue Ready Transfer Foam, 2.5x2.5 (in/in) (DME) (Generic) Every Other Day/15 Days Discharge Instructions: Apply Hydrofera Blue Ready to wound bed as directed Secondary Dressing: ABD Pad 5x9 (in/in) Every Other Day/15 Days Discharge Instructions: Cover with ABD pad Secured With: 31M Medipore H Soft Cloth Surgical Tape, 2x2 (in/yd) Every Other Day/15 Days Secured With: [employer] Sterile or Non-Sterile 6-ply 4.5x4 (yd/yd) (DME) (Generic) Every Other Day/15 Days Discharge Instructions: Apply Kerlix as directed 1. Would recommend currently that we going continue with the wound care measures per recommendations above this is going to be initiation today of the [facility] which I think be a good option. 2. I am also going to recommend an ABD pad to cover followed by roll gauze to secure in place. 3. I would also suggest a continuation of using the insert with offloading attached that Dr. Amalia Hailey gave the patient. Obviously if anything changes they should let me know. Otherwise the only other option I really would have would be a front offloading shoe which is a possibility for [place]. We just need to see how things go. Offloading however it is good to be his primary goal. We discussed the possibility of a total contact cast depending on how things are doing next week that may need to be strongly  considered. The only problem here is that this is his right foot so this would prohibit him from driving. We will see patient back for reevaluation in 1 week here in the clinic. If anything worsens or changes patient will contact our office for additional recommendations. Electronic Signature(s) Signed: 04/24/2021 5:19:08 PM By: Worthy Keeler PA-C Previous Signature: 04/24/2021 5:17:16 PM Version By: Worthy Keeler PA-C Entered By: Worthy Keeler on 04/24/2021 17:19:08 Peterson, Ralph S. (  [id number]) -------------------------------------------------------------------------------- ROS/PFSH Details Patient Name: Ralph Peterson, Ralph Peterson. Date of Service: 04/24/2021 10:15 AM Medical Record Number: [id number] Patient Account Number: [id number] Date of Birth/Sex: 08-22-1965 (55 y.o. M) Treating RN: Donnamarie Poag Primary Care Provider: Vidal Schwalbe Other Clinician: Referring Provider: Daylene Katayama Treating Provider/Extender: Skipper Cliche in Treatment: 0 Information Obtained From Patient Constitutional Symptoms (General Health) Complaints and Symptoms: Negative for: Fatigue; Fever; Chills; Marked Weight Change Eyes Complaints and Symptoms: Positive for: Glasses / Contacts Ear/Nose/Mouth/Throat Complaints and Symptoms: Negative for: Difficult clearing ears; Sinusitis Hematologic/Lymphatic Complaints and Symptoms: Negative for: Bleeding / Clotting Disorders; Human Immunodeficiency Virus Medical History: Negative for: Anemia; Hemophilia; Human Immunodeficiency Virus; Lymphedema; Sickle Cell Disease Respiratory Complaints and Symptoms: Negative for: Chronic or frequent coughs; Shortness of Breath Gastrointestinal Complaints and Symptoms: Negative for: Frequent diarrhea; Nausea; Vomiting Genitourinary Complaints and Symptoms: Positive for: Kidney failure/ Dialysis Immunological Complaints and Symptoms: Negative for: Hives; Itching Musculoskeletal Complaints and  Symptoms: Negative for: Muscle Pain; Muscle Weakness Cardiovascular Medical History: Positive for: Arrhythmia; Congestive Heart Failure; Coronary Artery Disease; Hypertension; Myocardial Infarction; Peripheral Venous Disease Negative for: Angina; Deep Vein Thrombosis; Phlebitis; Vasculitis Endocrine Ralph Peterson, Ralph Peterson ([id number]) Medical History: Positive for: Type II Diabetes - IDDM x 24 years Time with diabetes: 24 years Treated with: Oral agents, Diet Blood sugar tested every day: Yes Tested : Blood sugar testing results: Breakfast: 83 Neurologic Medical History: Positive for: Neuropathy Negative for: Dementia; Quadriplegia; Paraplegia; Seizure Disorder Oncologic Immunizations Pneumococcal Vaccine: Received Pneumococcal Vaccination: No Implantable Devices None Family and Social History Cancer: Yes - Father; Diabetes: Yes - Father; Heart Disease: No; Hereditary Spherocytosis: No; Hypertension: Yes - Mother,Child; Kidney Disease: No; Lung Disease: No; Seizures: No; Stroke: Yes - Mother; Thyroid Problems: No; Tuberculosis: No; Former smoker - ended on 07/22/2008; Marital Status - Divorced; Alcohol Use: Rarely; Drug Use: No History; Caffeine Use: Daily; Financial Concerns: No; Food, Clothing or Shelter Needs: No; Support System Lacking: No; Transportation Concerns: No Electronic Signature(s) Signed: 04/24/2021 2:08:39 PM By: Donnamarie Poag Signed: 04/24/2021 5:40:02 PM By: Worthy Keeler PA-C Entered By: Donnamarie Poag on 04/24/2021 10:46:26 Ralph Peterson ([id number]) -------------------------------------------------------------------------------- [place] Details Patient Name: Ralph Peterson. Date of Service: 04/24/2021 Medical Record Number: [id number] Patient Account Number: [id number] Date of Birth/Sex: 30-Mar-1966 (55 y.o. M) Treating RN: Donnamarie Poag Primary Care Provider: Vidal Schwalbe Other Clinician: Referring Provider: Daylene Katayama Treating  Provider/Extender: Skipper Cliche in Treatment: 0 Diagnosis Coding ICD-10 Codes Code Description E11.621 Type 2 diabetes mellitus with foot ulcer 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d I48.0 Paroxysmal atrial fibrillation Z79.01 Long term (current) use of anticoagulants I50.42 Chronic combined systolic (congestive) and diastolic (congestive) heart failure N18.6 End stage renal disease Z99.2 Dependence on renal dialysis Facility Procedures CPT4 Code: [id number] Description: 99213 - WOUND CARE VISIT-LEV 3 EST PT Modifier: Quantity: 1 CPT4 Code: [id number] Description: 11042 - DEB SUBQ TISSUE 20 SQ CM/< Modifier: Quantity: 1 CPT4 Code: Description: ICD-10 Diagnosis Description L97.512 Non-pressure chronic ulcer of other part of right foot with fat layer ex Modifier: posed Quantity: Physician Procedures CPT4 [id number] Description: 99214 - WC PHYS LEVEL 4 - EST PT Modifier: 25 Quantity: 1 CPT4 Code: Description: ICD-10 Diagnosis Description E11.621 Type 2 diabetes mellitus with foot ulcer L97.512 Non-pressure chronic ulcer of other part of right foot with fat layer ex I48.0 Paroxysmal atrial fibrillation Z79.01 Long term (current) use of  anticoagulants Modifier: posed Quantity: CPT4 Code: [id number] Description: 11042 - WC PHYS SUBQ TISS  20 SQ CM Modifier: Quantity: 1 CPT4 Code: Description: ICD-10 Diagnosis Description [id number] Non-pressure chronic ulcer of other part of right foot with fat layer ex Modifier: posed Quantity: Electronic Signature(s) Signed: 04/24/2021 5:17:36 PM By: Worthy Keeler PA-C Previous Signature: 04/24/2021 2:08:39 PM Version By: Donnamarie Poag Entered By: Worthy Keeler on 04/24/2021 17:17:36

## 2021-04-24 NOTE — Progress Notes (Signed)
JAIVION, REDGATE ([id number]) Visit Report for 04/24/2021 Abuse/Suicide Risk Screen Details Patient Name: Ralph Peterson, Ralph Peterson. Date of Service: 04/24/2021 10:15 AM Medical Record Number: [id number] Patient Account Number: [id number] Date of Birth/Sex: July 05, 1966 (55 y.o. M) Treating RN: Donnamarie Poag Primary Care Grenda Lora: Vidal Schwalbe Other Clinician: Referring Marl Seago: Daylene Katayama Treating Stanley Helmuth/Extender: Skipper Cliche in Treatment: 0 Abuse/Suicide Risk Screen Items Answer ABUSE RISK SCREEN: Has anyone close to you tried to hurt or harm you recentlyo No Do you feel uncomfortable with anyone in your familyo No Has anyone forced you do things that you didnot want to doo No Electronic Signature(s) Signed: 04/24/2021 2:08:39 PM By: Donnamarie Poag Entered By: Donnamarie Poag on 04/24/2021 10:46:33 Ralph Peterson ([id number]) -------------------------------------------------------------------------------- Activities of Daily Living Details Patient Name: Ralph Peterson. Date of Service: 04/24/2021 10:15 AM Medical Record Number: [id number] Patient Account Number: [id number] Date of Birth/Sex: 07-15-1966 (55 y.o. M) Treating RN: Donnamarie Poag Primary Care Letica Giaimo: Vidal Schwalbe Other Clinician: Referring Tajia Szeliga: Daylene Katayama Treating Brenna Friesenhahn/Extender: Skipper Cliche in Treatment: 0 Activities of Daily Living Items Answer Activities of Daily Living (Please select one for each item) Drive Automobile Completely Able Take Medications Completely Able Use Telephone Completely Able Care for Appearance Completely Able Use Toilet Completely Able Bath / Shower Completely Able Dress Self Completely Able Feed Self Completely Able Walk Completely Able Get In / Out Bed Completely Able Housework Completely Able Prepare Meals Completely [place] Completely Able Shop for Self Completely Able Electronic Signature(s) Signed: 04/24/2021 2:08:39 PM By: Donnamarie Poag Entered By: Donnamarie Poag on 04/24/2021 10:46:51 Ralph Peterson ([id number]) -------------------------------------------------------------------------------- Education Screening Details Patient Name: Ralph Peterson Date of Service: 04/24/2021 10:15 AM Medical Record Number: [id number] Patient Account Number: [id number] Date of Birth/Sex: September 13, 1965 (55 y.o. M) Treating RN: Donnamarie Poag Primary Care Nasario Czerniak: Vidal Schwalbe Other Clinician: Referring Jameika Kinn: Daylene Katayama Treating Arynn Armand/Extender: Skipper Cliche in Treatment: 0 Primary Learner Assessed: Patient Learning Preferences/Education Level/Primary Language Learning Preference: Explanation Highest Education Level: College or Above Preferred Language: English Cognitive Barrier Language Barrier: No Translator Needed: No Memory Deficit: No Emotional Barrier: No Cultural/Religious Beliefs Affecting Medical Care: No Physical Barrier Impaired Vision: No Impaired Hearing: No Decreased Hand dexterity: No Knowledge/Comprehension Knowledge Level: High Comprehension Level: High Ability to understand written instructions: High Ability to understand verbal instructions: High Motivation Anxiety Level: Calm Cooperation: Cooperative Education Importance: Acknowledges Need Interest in Health Problems: Asks Questions Perception: Coherent Willingness to Engage in Self-Management High Activities: Readiness to Engage in Self-Management High Activities: Electronic Signature(s) Signed: 04/24/2021 2:08:39 PM By: Donnamarie Poag Entered ByDonnamarie Poag on 04/24/2021 10:47:19 Ralph Peterson ([id number]) -------------------------------------------------------------------------------- Fall Risk Assessment Details Patient Name: Ralph Peterson. Date of Service: 04/24/2021 10:15 AM Medical Record Number: [id number] Patient Account Number: [id number] Date of Birth/Sex: 08-08-1965 (55 y.o. M) Treating RN: Donnamarie Poag Primary Care Laylynn Campanella: Vidal Schwalbe Other Clinician: Referring Madison Albea: Daylene Katayama Treating Itzy Adler/Extender: Skipper Cliche in Treatment: 0 Fall Risk Assessment Items Have you had 2 or more falls in the last 12 monthso 0 No Have you had any fall that resulted in injury in the last 12 monthso 0 No FALLS RISK SCREEN History of falling - immediate or within 3 months 0 No Secondary diagnosis (Do you have 2 or more medical diagnoseso) 0 No Ambulatory aid None/bed rest/wheelchair/nurse 0 Yes Crutches/cane/walker 0 No Furniture 0 No Intravenous therapy Access/Saline/Heparin Lock 0 No Gait/Transferring Normal/ bed rest/ wheelchair 0 Yes Weak (short steps with or without shuffle,  stooped but able to lift head while walking, may 0 No seek support from furniture) Impaired (short steps with shuffle, may have difficulty arising from chair, head down, impaired 0 No balance) Mental Status Oriented to own ability 0 Yes Electronic Signature(s) Signed: 04/24/2021 2:08:39 PM By: Donnamarie Poag Entered By: Donnamarie Poag on 04/24/2021 10:47:30 Ralph Peterson ([id number]) -------------------------------------------------------------------------------- Foot Assessment Details Patient Name: Ralph Peterson. Date of Service: 04/24/2021 10:15 AM Medical Record Number: [id number] Patient Account Number: [id number] Date of Birth/Sex: 05-01-1966 (55 y.o. M) Treating RN: Donnamarie Poag Primary Care Marcia Lepera: Vidal Schwalbe Other Clinician: Referring Brooklynn Brandenburg: Daylene Katayama Treating Jillyan Plitt/Extender: Skipper Cliche in Treatment: 0 Foot Assessment Items Site Locations + = Sensation present, - = Sensation absent, C = Callus, U = Ulcer R = Redness, W = Warmth, M = Maceration, PU = Pre-ulcerative lesion F = Fissure, S = Swelling, D = Dryness Assessment Right: Left: Other Deformity: No No Prior Foot Ulcer: No No Prior Amputation: Yes No Charcot Joint: No No Ambulatory Status:  Ambulatory Without Help Gait: Steady Electronic Signature(s) Signed: 04/24/2021 2:08:39 PM By: Donnamarie Poag Entered By: Donnamarie Poag on 04/24/2021 10:54:40 Ralph Peterson ([id number]) -------------------------------------------------------------------------------- Nutrition Risk Screening Details Patient Name: Ralph Peterson. Date of Service: 04/24/2021 10:15 AM Medical Record Number: [id number] Patient Account Number: [id number] Date of Birth/Sex: 06-15-1966 (55 y.o. M) Treating RN: Donnamarie Poag Primary Care Lorree Millar: Vidal Schwalbe Other Clinician: Referring Jezabelle Chisolm: Daylene Katayama Treating Katelyn Kohlmeyer/Extender: Skipper Cliche in Treatment: 0 Height (in): 72 Weight (lbs): 219 Body Mass Index (BMI): 29.7 Nutrition Risk Screening Items Score Screening NUTRITION RISK SCREEN: I have an illness or condition that made me change the kind and/or amount of food I eat 0 No I eat fewer than two meals per day 0 No I eat few fruits and vegetables, or milk products 0 No I have three or more drinks of beer, liquor or wine almost every day 0 No I have tooth or mouth problems that make it hard for me to eat 0 No I don't always have enough money to buy the food I need 0 No I eat alone most of the time 0 No I take three or more different prescribed or over-the-counter drugs a day 1 Yes Without wanting to, I have lost or gained 10 pounds in the last six months 0 No I am not always physically able to shop, cook and/or feed myself 0 No Nutrition Protocols Good Risk Protocol 0 No interventions needed Moderate Risk Protocol High Risk Proctocol Risk Level: Good Risk Score: 1 Electronic Signature(s) Signed: 04/24/2021 2:08:39 PM By: Donnamarie Poag Entered ByDonnamarie Poag on 04/24/2021 10:47:42

## 2021-05-01 ENCOUNTER — Encounter: Payer: Medicare PPO | Admitting: Internal Medicine

## 2021-05-01 DIAGNOSIS — E11621 Type 2 diabetes mellitus with foot ulcer: Secondary | ICD-10-CM | POA: Diagnosis not present

## 2021-05-01 NOTE — Progress Notes (Signed)
Ralph, Peterson ([id number]) Visit Report for 05/01/2021 Arrival Information Details Patient Name: Ralph Peterson, Ralph Peterson. Date of Service: 05/01/2021 11:30 AM Medical Record Number: [id number] Patient Account Number: [id number] Date of Birth/Sex: 1965-09-04 (55 y.o. M) Treating RN: Donnamarie Poag Primary Care Oluwadamilare Tobler: Vidal Schwalbe Other Clinician: Referring Ronnae Kaser: Vidal Schwalbe Treating Meghan Warshawsky/Extender: Tito Dine in Treatment: 1 Visit Information History Since Last Visit Added or deleted any medications: No Patient Arrived: Ambulatory Had a fall or experienced change in No Arrival Time: 11:25 activities of daily living that may affect Accompanied By: self risk of falls: Transfer Assistance: None Hospitalized since last visit: No Patient Identification Verified: Yes Has Dressing in Place as Prescribed: Yes Secondary Verification Process Completed: Yes Pain Present Now: No Patient Has Alerts: Yes Patient Alerts: Patient on Blood Thinner Eliquis Diabetic AVVS 07/2020 ABI LandR=Arabi Electronic Signature(s) Signed: 05/01/2021 2:36:26 PM By: Donnamarie Poag Entered By: Donnamarie Poag on 05/01/2021 11:30:19 Ralph Peterson ([id number]) -------------------------------------------------------------------------------- Clinic Level of Care Assessment Details Patient Name: Ralph Peterson Date of Service: 05/01/2021 11:30 AM Medical Record Number: [id number] Patient Account Number: [id number] Date of Birth/Sex: 1966/02/14 (55 y.o. M) Treating RN: Donnamarie Poag Primary Care Jhamari Markowicz: Vidal Schwalbe Other Clinician: Referring Marylin Lathon: Vidal Schwalbe Treating Sylar Voong/Extender: Tito Dine in Treatment: 1 Clinic Level of Care Assessment Items TOOL 1 Quantity Score '[]'$  - Use when EandM and Procedure is performed on INITIAL visit 0 ASSESSMENTS - Nursing Assessment / Reassessment '[]'$  - General Physical Exam (combine w/ comprehensive assessment (listed  just below) when performed on new 0 pt. evals) '[]'$  - 0 Comprehensive Assessment (HX, ROS, Risk Assessments, Wounds Hx, etc.) ASSESSMENTS - Wound and Skin Assessment / Reassessment '[]'$  - Dermatologic / Skin Assessment (not related to wound area) 0 ASSESSMENTS - Ostomy and/or Continence Assessment and Care '[]'$  - Incontinence Assessment and Management 0 '[]'$  - 0 Ostomy Care Assessment and Management (repouching, etc.) PROCESS - Coordination of Care '[]'$  - Simple Patient / Family Education for ongoing care 0 '[]'$  - 0 Complex (extensive) Patient / Family Education for ongoing care '[]'$  - 0 Staff obtains Programmer, systems, Records, Test Results / Process Orders '[]'$  - 0 Staff telephones HHA, Nursing Homes / Clarify orders / etc '[]'$  - 0 Routine Transfer to another Facility (non-emergent condition) '[]'$  - 0 Routine Hospital Admission (non-emergent condition) '[]'$  - 0 New Admissions / Biomedical engineer / Ordering NPWT, Apligraf, etc. '[]'$  - 0 Emergency Hospital Admission (emergent condition) PROCESS - Special Needs '[]'$  - Pediatric / Minor Patient Management 0 '[]'$  - 0 Isolation Patient Management '[]'$  - 0 Hearing / Language / Visual special needs '[]'$  - 0 Assessment of Community assistance (transportation, D/C planning, etc.) '[]'$  - 0 Additional assistance / Altered mentation '[]'$  - 0 Support Surface(s) Assessment (bed, cushion, seat, etc.) INTERVENTIONS - Miscellaneous '[]'$  - External ear exam 0 '[]'$  - 0 Patient Transfer (multiple staff / Civil Service fast streamer / Similar devices) '[]'$  - 0 Simple Staple / Suture removal (25 or less) '[]'$  - 0 Complex Staple / Suture removal (26 or more) '[]'$  - 0 Hypo/Hyperglycemic Management (do not check if billed separately) '[]'$  - 0 Ankle / Brachial Index (ABI) - do not check if billed separately Has the patient been seen at the hospital within the last three years: Yes Total Score: 0 Level Of Care: ____ Ralph Peterson ([id number]) Electronic Signature(s) Signed: 05/01/2021 2:36:26  PM By: Donnamarie Poag Entered By: Donnamarie Poag on 05/01/2021 11:47:42 Ralph Peterson ([id number]) -------------------------------------------------------------------------------- Encounter Discharge Information Details Patient Name: Ralph Peterson,  Ralph S. Date of Service: 05/01/2021 11:30 AM Medical Record Number: [id number] Patient Account Number: [id number] Date of Birth/Sex: [birth year] (55 y.o. M) Treating RN: Donnamarie Poag Primary Care Aysel Gilchrest: Vidal Schwalbe Other Clinician: Referring Tyler Cubit: Vidal Schwalbe Treating Chayah Mckee/Extender: Tito Dine in Treatment: 1 Encounter Discharge Information Items Post Procedure Vitals Discharge Condition: Stable Temperature (F): 98.4 Ambulatory Status: Ambulatory Pulse (bpm): 98 Discharge Destination: Home Respiratory Rate (breaths/min): 16 Transportation: Private Auto Blood Pressure (mmHg): 137/85 Accompanied By: self Schedule Follow-up Appointment: Yes Clinical Summary of Care: Electronic Signature(s) Signed: 05/01/2021 2:36:26 PM By: Donnamarie Poag Entered By: Donnamarie Poag on 05/01/2021 11:53:48 Ralph Peterson ([id number]) -------------------------------------------------------------------------------- Lower Extremity Assessment Details Patient Name: Ralph Peterson. Date of Service: 05/01/2021 11:30 AM Medical Record Number: [id number] Patient Account Number: [id number] Date of Birth/Sex: [birth year] (55 y.o. M) Treating RN: Donnamarie Poag Primary Care Veida Spira: Vidal Schwalbe Other Clinician: Referring Korissa Horsford: Vidal Schwalbe Treating Syesha Thaw/Extender: Ricard Dillon Weeks in Treatment: 1 Edema Assessment Assessed: [Left: No] [Right: No] Edema: [Left: N] [Right: o] Vascular Assessment Pulses: Dorsalis Pedis Palpable: [Right:Yes] Electronic Signature(s) Signed: 05/01/2021 2:36:26 PM By: Donnamarie Poag Entered By: Donnamarie Poag on 05/01/2021 11:33:13 Ralph Peterson  ([id number]) -------------------------------------------------------------------------------- Multi Wound Chart Details Patient Name: Ralph Peterson. Date of Service: 05/01/2021 11:30 AM Medical Record Number: [id number] Patient Account Number: [id number] Date of Birth/Sex: 01-Dec-1965 (55 y.o. M) Treating RN: Donnamarie Poag Primary Care Marteze Vecchio: Vidal Schwalbe Other Clinician: Referring Mikhai Bienvenue: Vidal Schwalbe Treating Miki Labuda/Extender: Tito Dine in Treatment: 1 Vital Signs Height(in): 72 Pulse(bpm): 98 Weight(lbs): 219 Blood Pressure(mmHg): 137/85 Body Mass Index(BMI): 30 Temperature(F): 98.4 Respiratory Rate(breaths/min): 16 Photos: [N/A:N/A] Wound Location: Right, Distal, Plantar Foot N/A N/A Wounding Event: Blister N/A N/A Primary Etiology: Diabetic Wound/Ulcer of the Lower N/A N/A Extremity Comorbid History: Arrhythmia, Congestive Heart N/A N/A Failure, Coronary Artery Disease, Hypertension, Myocardial Infarction, Peripheral Venous Disease, Type II Diabetes, Neuropathy Date Acquired: 04/10/2021 N/A N/A Weeks of Treatment: 1 N/A N/A Wound Status: Open N/A N/A Measurements L x W x D (cm) 0.9x1x0.3 N/A N/A Area (cm) : 0.707 N/A N/A Volume (cm) : 0.212 N/A N/A % Reduction in Area: -12.60% N/A N/A % Reduction in Volume: -68.30% N/A N/A Starting Position 1 (o'clock): 12 Ending Position 1 (o'clock): 12 Maximum Distance 1 (cm): 0.2 Undermining: Yes N/A N/A Classification: Grade 2 N/A N/A Exudate Amount: Medium N/A N/A Exudate Type: Serosanguineous N/A N/A Exudate Color: red, brown N/A N/A Wound Margin: Thickened N/A N/A Granulation Amount: Large (67-100%) N/A N/A Granulation Quality: Red, Pink N/A N/A Necrotic Amount: Small (1-33%) N/A N/A Exposed Structures: Fat Layer (Subcutaneous Tissue): N/A N/A Yes Fascia: No Tendon: No Muscle: No Joint: No Bone: No Debridement: Debridement - Excisional N/A N/A Pre-procedure Verification/Time 11:42 N/A  N/A Out Taken: Pain Control: Lidocaine N/A N/A Tissue Debrided: Callus, Subcutaneous, Slough N/A N/A Level: Skin/Subcutaneous Tissue N/A N/A Debridement Area (sq cm): 1.1 N/A N/A ROSS, [place] ([id number]) Instrument: Curette N/A N/A Bleeding: Minimum N/A N/A Hemostasis Achieved: Pressure N/A N/A Debridement Treatment Procedure was tolerated well N/A N/A Response: Post Debridement 1.1x1x0.3 N/A N/A Measurements L x W x D (cm) Post Debridement Volume: 0.259 N/A N/A (cm) Procedures Performed: Debridement N/A N/A Treatment Notes Electronic Signature(s) Signed: 05/01/2021 3:54:48 PM By: Linton Ham MD Entered By: Linton Ham on 05/01/2021 11:52:22 Ralph Peterson ([id number]) -------------------------------------------------------------------------------- Multi-Disciplinary Care Plan Details Patient Name: Ralph Peterson Date of Service: 05/01/2021 11:30 AM Medical Record Number: [id number] Patient Account Number: [id number] Date of Birth/Sex: Jul 29, 1965 (  55 y.o. M) Treating RN: Donnamarie Poag Primary Care Trena Dunavan: Vidal Schwalbe Other Clinician: Referring Jahira Swiss: Vidal Schwalbe Treating Lamonte Hartt/Extender: Tito Dine in Treatment: 1 Active Inactive Wound/Skin Impairment Nursing Diagnoses: Impaired tissue integrity Knowledge deficit related to smoking impact on wound healing Knowledge deficit related to ulceration/compromised skin integrity Goals: Patient/caregiver will verbalize understanding of skin care regimen Date Initiated: 04/24/2021 Target Resolution Date: 05/11/2021 Goal Status: Active Ulcer/skin breakdown will have a volume reduction of 30% by week 4 Date Initiated: 04/24/2021 Target Resolution Date: 05/24/2021 Goal Status: Active Ulcer/skin breakdown will have a volume reduction of 50% by week 8 Date Initiated: 04/24/2021 Target Resolution Date: 06/23/2021 Goal Status: Active Ulcer/skin breakdown will have a volume  reduction of 80% by week 12 Date Initiated: 04/24/2021 Target Resolution Date: 07/24/2021 Goal Status: Active Ulcer/skin breakdown will heal within 14 weeks Date Initiated: 04/24/2021 Target Resolution Date: 08/05/2021 Goal Status: Active Interventions: Assess patient/caregiver ability to obtain necessary supplies Assess patient/caregiver ability to perform ulcer/skin care regimen upon admission and as needed Assess ulceration(s) every visit Notes: Electronic Signature(s) Signed: 05/01/2021 2:36:26 PM By: Donnamarie Poag Entered By: Donnamarie Poag on 05/01/2021 11:33:21 Ralph Peterson ([id number]) -------------------------------------------------------------------------------- Pain Assessment Details Patient Name: Ralph Peterson. Date of Service: 05/01/2021 11:30 AM Medical Record Number: [id number] Patient Account Number: [id number] Date of Birth/Sex: [birth year] (55 y.o. M) Treating RN: Donnamarie Poag Primary Care Mcgregor Tinnon: Vidal Schwalbe Other Clinician: Referring Lauraann Missey: Vidal Schwalbe Treating Angelia Hazell/Extender: Tito Dine in Treatment: 1 Active Problems Location of Pain Severity and Description of Pain Patient Has Paino No Site Locations Rate the pain. Current Pain Level: 0 Pain Management and Medication Current Pain Management: Electronic Signature(s) Signed: 05/01/2021 2:36:26 PM By: Donnamarie Poag Entered By: Donnamarie Poag on 05/01/2021 11:31:07 Ralph Peterson ([id number]) -------------------------------------------------------------------------------- Patient/Caregiver Education Details Patient Name: Ralph Peterson Date of Service: 05/01/2021 11:30 AM Medical Record Number: [id number] Patient Account Number: [id number] Date of Birth/Gender: 04-28-1966 (55 y.o. M) Treating RN: Donnamarie Poag Primary Care Physician: Vidal Schwalbe Other Clinician: Referring Physician: Vidal Schwalbe Treating Physician/Extender: Tito Dine in  Treatment: 1 Education Assessment Education Provided To: Patient Education Topics Provided Basic Hygiene: Infection: Offloading: Wound Debridement: Wound/Skin Impairment: Electronic Signature(s) Signed: 05/01/2021 2:36:26 PM By: Donnamarie Poag Entered By: Donnamarie Poag on 05/01/2021 11:48:02 Ralph Peterson ([id number]) -------------------------------------------------------------------------------- Wound Assessment Details Patient Name: Ralph Peterson. Date of Service: 05/01/2021 11:30 AM Medical Record Number: [id number] Patient Account Number: [id number] Date of Birth/Sex: [birth year] (55 y.o. M) Treating RN: Donnamarie Poag Primary Care Zacory Fiola: Vidal Schwalbe Other Clinician: Referring Cecile Gillispie: Vidal Schwalbe Treating Milliana Reddoch/Extender: Tito Dine in Treatment: 1 Wound Status Wound Number: 10 Primary Diabetic Wound/Ulcer of the Lower Extremity Etiology: Wound Location: Right, Distal, Plantar Foot Wound Open Wounding Event: Blister Status: Date Acquired: 04/10/2021 Comorbid Arrhythmia, Congestive Heart Failure, Coronary Artery Weeks Of Treatment: 1 History: Disease, Hypertension, Myocardial Infarction, Peripheral Clustered Wound: No Venous Disease, Type II Diabetes, Neuropathy Photos Wound Measurements Length: (cm) 0.9 % R Width: (cm) 1 % R Depth: (cm) 0.3 Tun Area: (cm) 0.707 Un Volume: (cm) 0.212 E M eduction in Area: -12.6% eduction in Volume: -68.3% neling: No dermining: Yes Starting Position (o'clock): 12 nding Position (o'clock): 12 aximum Distance: (cm) 0.2 Wound Description Classification: Grade 2 Fou Wound Margin: Thickened Slo Exudate Amount: Medium Exudate Type: Serosanguineous Exudate Color: red, brown l Odor After Cleansing: No ugh/Fibrino Yes Wound Bed Granulation Amount: Large (67-100%) Exposed Structure Granulation Quality: Red, Pink Fascia Exposed: No Necrotic Amount: Small (  1-33%) Fat Layer (Subcutaneous Tissue)  Exposed: Yes Necrotic Quality: Adherent Slough Tendon Exposed: No Muscle Exposed: No Joint Exposed: No Bone Exposed: No Treatment Notes Wound #10 (Foot) Wound Laterality: Plantar, Right, Distal Cleanser Soap and Water HASAAN, TREVOR ([id number]) Discharge Instruction: Gently cleanse wound with antibacterial soap, rinse and pat dry prior to dressing wounds Peri-Wound Care Topical Primary Dressing Hydrofera Blue Ready Transfer Foam, 2.5x2.5 (in/in) Discharge Instruction: Apply Hydrofera Blue Ready to wound bed as directed Secondary Dressing ABD Pad 5x9 (in/in) Discharge Instruction: Cover with ABD pad Secured With 85M Medipore H Soft Cloth Surgical Tape, 2x2 (in/yd) Kerlix Roll Sterile or Non-Sterile 6-ply 4.5x4 (yd/yd) Discharge Instruction: Apply Kerlix as directed Compression Wrap Compression Stockings Add-Ons Electronic Signature(s) Signed: 05/01/2021 2:36:26 PM By: Donnamarie Poag Entered By: Donnamarie Poag on 05/01/2021 11:44:14 Ralph Peterson ([id number]) -------------------------------------------------------------------------------- Sharpes Details Patient Name: Ralph Peterson. Date of Service: 05/01/2021 11:30 AM Medical Record Number: [id number] Patient Account Number: [id number] Date of Birth/Sex: 12/13/1965 (55 y.o. M) Treating RN: Donnamarie Poag Primary Care Estelle Greenleaf: Vidal Schwalbe Other Clinician: Referring Michella Detjen: Vidal Schwalbe Treating Sabiha Sura/Extender: Tito Dine in Treatment: 1 Vital Signs Time Taken: 11:25 Temperature (F): 98.4 Height (in): 72 Pulse (bpm): 98 Weight (lbs): 219 Respiratory Rate (breaths/min): 16 Body Mass Index (BMI): 29.7 Blood Pressure (mmHg): 137/85 Reference Range: 80 - 120 mg / dl Electronic Signature(s) Signed: 05/01/2021 2:36:26 PM By: Donnamarie Poag Entered ByDonnamarie Poag on 05/01/2021 11:31:00

## 2021-05-01 NOTE — Progress Notes (Signed)
LARWENCE, DUANE ([id number]) Visit Report for 05/01/2021 Debridement Details Patient Name: Ralph Peterson, Ralph Peterson. Date of Service: 05/01/2021 11:30 AM Medical Record Number: [id number] Patient Account Number: [id number] Date of Birth/Sex: [birth year] (55 y.o. M) Treating RN: Donnamarie Poag Primary Care Provider: Vidal Schwalbe Other Clinician: Referring Provider: Vidal Schwalbe Treating Provider/Extender: Tito Dine in Treatment: 1 Debridement Performed for Wound #10 Right,Distal,Plantar Foot Assessment: Performed By: Physician Ricard Dillon, MD Debridement Type: Debridement Severity of Tissue Pre Debridement: Fat layer exposed Level of Consciousness (Pre- Awake and Alert procedure): Pre-procedure Verification/Time Out Yes - 11:42 Taken: Start Time: 11:42 Pain Control: Lidocaine Total Area Debrided (L x W): 1 (cm) x 1.1 (cm) = 1.1 (cm) Tissue and other material Viable, Non-Viable, Callus, Slough, Subcutaneous, Slough debrided: Level: Skin/Subcutaneous Tissue Debridement Description: Excisional Instrument: Curette Bleeding: Minimum Hemostasis Achieved: Pressure Response to Treatment: Procedure was tolerated well Level of Consciousness (Post- Awake and Alert procedure): Post Debridement Measurements of Total Wound Length: (cm) 1.1 Width: (cm) 1 Depth: (cm) 0.3 Volume: (cm) 0.259 Character of Wound/Ulcer Post Debridement: Improved Severity of Tissue Post Debridement: Fat layer exposed Post Procedure Diagnosis Same as Pre-procedure Electronic Signature(s) Signed: 05/01/2021 2:36:26 PM By: Donnamarie Poag Signed: 05/01/2021 3:54:48 PM By: Linton Ham MD Entered By: Linton Ham on 05/01/2021 11:52:57 Ralph Peterson ([id number]) -------------------------------------------------------------------------------- HPI Details Patient Name: Ralph Peterson Date of Service: 05/01/2021 11:30 AM Medical Record Number: [id number] Patient  Account Number: [id number] Date of Birth/Sex: 1965/08/08 (55 y.o. M) Treating RN: Donnamarie Poag Primary Care Provider: Vidal Schwalbe Other Clinician: Referring Provider: Vidal Schwalbe Treating Provider/Extender: Tito Dine in Treatment: 1 History of Present Illness HPI Description: 08/17/2020 upon evaluation today patient actually appears to be doing decently well today which is good news in regard to the dorsal wound which is almost healed and the lateral wound also doing much better. With regard to the plantar wound this is still draining purulent drainage unfortunately the culture did reveal that he had Pseudomonas as well as Enterococcus. With that being said he is allergic to penicillin which took away the only oral medication for the Enterococcus I did look at linezolid as a possibility but unfortunately there were medication interactions. Also the same was true the Cipro where there were medication interactions. Nonetheless I want to see if we can get him into infectious disease to see if they can work with him to try to find possibly add at home IV antibiotic regimen that can help out at this point. No sharp debridement is can be necessary today. 09/07/2020 upon evaluation today patient appears to be doing unfortunately not too well in regard to his wound. He has been tolerating the dressing changes without complication. Fortunately there is no sign of active infection at this time. No fevers, chills, nausea, vomiting, or diarrhea. The patient did have a repeat surgery going to clean out the abscess and try to work on getting things moving in a better direction about a week and a half ago. He still has sutures in place. With that being said unfortunately he does not appear to be showing signs of a lot of improvement in my opinion. He does have sutures on the plantar aspect still in place and on the dorsal aspect he has a small opening which actually probes all the way down  through and actually came out of the plantar aspect. I feel like that he really is having a lot of issues here and I am afraid that if we  do not get this taken care of this is can end up with him having a much larger and more extensive amputation than what he was hoping to have to deal with with this. It has already been since June 2021 but has been dealing with this including all the time since the amputation. 09/14/2020 upon evaluation today patient appears to be doing really about the same in regard to the wound on the dorsal surface of his foot. The plantar foot unfortunately still has sutures in place I really cannot see how things are doing in that regard. With the dorsal foot however this appears to show signs of still having a small opening in the distal aspect which does actually probe down to bone there is also significant purulent drainage we did actually obtain a wound culture today to further evaluate the issue here. 09/21/2020 upon evaluation today patient appears to be doing about the same in regard to his wound on the dorsal surface of his foot and the plantar foot he still does not have the sutures out at this point. That should hopefully happen in the next week. With that being said the main issue is he continues to have drainage here. It was noted that he had positive findings on culture for Enterobacter. With that being said he cannot take oral penicillin he could do vancomycin and I think that is good to be the ideal thing this to see if we can get him started on IV vancomycin at dialysis. We will get a get in touch with them to try to see if we can coordinate that being done there at the dialysis center. I think that is the ideal situation at this point I would probably start with just 2 weeks of therapy. 09/28/2020 upon evaluation today patient appears to be doing well with regard to his wounds in general. He also has back on the vancomycin after the culture results and this seems to  be doing excellent for him. All the wounds are showing signs of good improvement. No fevers, chills, nausea, vomiting, or diarrhea. 10/05/2020 upon evaluation today patient appears to actually be doing excellent in regard to his foot. Fortunately there is a lot of signs of new granulation epithelization at this point. There does not appear to be any evidence of active infection which is great news and overall very pleased with where things stand. In fact the open wound along the plantar aspect of his foot as well as the dorsal part of his foot has dramatically improved since we went forward with the vancomycin and requested that the dialysis center provide this to him by way of IV antibiotics with his dialysis. Overall I think that has made a dramatic improvement and overall his wound seems to be doing significantly better at this point. No fevers, chills, nausea, vomiting, or diarrhea. The patient was set to have his second opinion appointment with podiatry [facility] next week but to be perfectly honest I am not even certain that that is necessary at this point I do not think there is much that they would be able to do for him nor that he really would want them to do considering how well things look at this time. 10/12/2020 on evaluation today patient appears to be doing excellent in regard to his wounds. In fact the only issue I see is that everything is dried out as far as the collagen is concerned. I do think we can try to keep this moist he will continue  to make excellent progress as he has been. Overall I am extremely pleased with what I see today. 10/19/2020 upon evaluation today patient appears to be doing decently well in regard to the wounds on his foot he is can require some debridement in this location. Unfortunately is having some issues with blistering over the leg which is of greater concern at this point. Fortunately I do not see any signs of active infection which is great news. Nonetheless  the blistering may just be related to venous insufficiency or subsequently could potentially be an issue with overall worsening with regard to an infection but again it is not really hot to touch. 10/26/2020 upon evaluation today patient appears to be doing well with regard to the majority of his wounds. Unfortunately the dorsal surface of his foot does not appear to be doing quite as well today compared to what it has been doing. I do think that we will need to address that currently. Everything else is showing signs of improvement. He did not get the prescription that I gave him previous as unfortunately the transmission failed. I did not know that until today when the patient mention to me that he never got the antibiotic. 11/02/20 upon evaluation today patient appears to be doing much better in regard to his wounds in general. I feel like that he is making good progress and even the top of the foot does not appear to be doing too poorly at this time. He did finally get the antibiotics which is good. Unfortunately it does not look like I am able to electronically sent to his pharmacy it kept being an error in transmission every time this was sent. We finally decided to call it in. Nonetheless overall I think he is headed in the right direction based on what I am seeing. 11/09/2020 upon evaluation today patient appears to be doing well in general in regard to his wounds. I think that he is making some progress here. With that being said there is definitely still some areas of concern. Specifically the dorsal aspect of his foot distally and the lateral aspect of his foot proximally. Both of these areas are still open everything else appears to likely be closed today based on what I am seeing. 4/28; the patient really looks quite good. The area over the base of the right fifth metatarsal and the base of the fifth MTP both look healed to me. 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 They are using Hydrofera Blue rope but I think this is far too small wound now for this 11/23/2020 upon evaluation today patient actually appears to be doing excellent. In fact I do not really see anything that is actually open anywhere at this point. With that being said I am a little reluctant to completely just healing out and discharge him based on the dorsal foot wound being closed even though I do not see anything open we have been there down this road before where things would reopen causing additional problems. Nonetheless I think that we need to monitor him a little bit more closely to make sure nothing worsens again. 11/30/2020 upon evaluation today patient actually appears to be doing quite well in regard to his wound at this point. He has been tolerating the dressing changes without complication which is great news and overall I am extremely pleased with where things  stand today. No fevers, chills, nausea, vomiting, or diarrhea. Readmission: 04/24/2021 upon evaluation today patient presents for reevaluation here in the clinic this is a patient that I have previously taken care of when he had surgical wounds that had to be healed and this was several months back he in fact healed I believe in May 2022. Subsequently he has an issue now with his foot as he has been walking around more and this has caused an abnormal pressure location. This again is on the foot where he had all the surgery previous. That is on the right plantar foot. With that being said unfortunately the pati having a significant issue here with pressure ent does seem to be which I think is not can be the easiest thing to manage due to the shape of his foot. He is supposed to be getting new diabetic shoes fitted but right now he still has the ones currently that he has been you been using previously. Otherwise none of his major medical  problems have shifted at this point. 10/11; second visit for this man who has a area right plantar foot at roughly the fourth metatarsal head. We have been using Hydrofera Blue and a surgical shoe. Electronic Signature(s) Signed: 05/01/2021 3:54:48 PM By: Linton Ham MD Entered By: Linton Ham on 05/01/2021 11:54:17 Ralph Peterson ([id number]) -------------------------------------------------------------------------------- Physical Exam Details Patient Name: Ralph Peterson Date of Service: 05/01/2021 11:30 AM Medical Record Number: [id number] Patient Account Number: [id number] Date of Birth/Sex: 08-06-1965 (55 y.o. M) Treating RN: Donnamarie Poag Primary Care Provider: Vidal Schwalbe Other Clinician: Referring Provider: Vidal Schwalbe Treating Provider/Extender: Tito Dine in Treatment: 1 Constitutional Sitting or standing Blood Pressure is within target range for patient.. Pulse regular and within target range for patient.Marland Kitchen Respirations regular, non- labored and within target range.. Temperature is normal and within the target range for the patient.Marland Kitchen appears in no distress. Notes Wound exam; the granulation does not look too bad on the surface of the wound. He does have a few millimeters of circumferential undermining. There is no evidence of infection. I used a #5 curette to remove the overhanging skin and subcutaneous tissue minimal bleeding Electronic Signature(s) Signed: 05/01/2021 3:54:48 PM By: Linton Ham MD Entered By: Linton Ham on 05/01/2021 11:55:58 Ralph Peterson ([id number]) -------------------------------------------------------------------------------- Physician Orders Details Patient Name: Ralph Peterson. Date of Service: 05/01/2021 11:30 AM Medical Record Number: [id number] Patient Account Number: [id number] Date of Birth/Sex: 12-15-1965 (55 y.o. M) Treating RN: Donnamarie Poag Primary Care Provider: Vidal Schwalbe  Other Clinician: Referring Provider: Vidal Schwalbe Treating Provider/Extender: Tito Dine in Treatment: 1 Verbal / Phone Orders: No Diagnosis Coding Follow-up Appointments o Return Appointment in 1 week. Bathing/ Shower/ Hygiene o May shower; gently cleanse wound with antibacterial soap, rinse and pat dry prior to dressing wounds - keep dressing dry/change after bathing o May shower with wound dressing protected with water repellent cover or cast protector. o No tub bath. Off-Loading o Total Contact Cast to Right Lower Extremity - May consider TCC at next appt, please bring a driver o Other: - offload insert from [facility] Wound Treatment Wound #10 - Foot Wound Laterality: Plantar, Right, Distal Cleanser: Soap and Water Every Other Day/15 Days Discharge Instructions: Gently cleanse wound with antibacterial soap, rinse and pat dry prior to dressing wounds Primary Dressing: Hydrofera Blue Ready Transfer Foam, 2.5x2.5 (in/in) (Generic) Every Other Day/15 Days Discharge Instructions: Apply Hydrofera Blue Ready to wound bed as directed Secondary Dressing: ABD Pad  5x9 (in/in) Every Other Day/15 Days Discharge Instructions: Cover with ABD pad Secured With: 67M Medipore H Soft Cloth Surgical Tape, 2x2 (in/yd) Every Other Day/15 Days Secured With: Kerlix Roll Sterile or Non-Sterile 6-ply 4.5x4 (yd/yd) (Generic) Every Other Day/15 Days Discharge Instructions: Apply Kerlix as directed Electronic Signature(s) Signed: 05/01/2021 2:36:26 PM By: Donnamarie Poag Signed: 05/01/2021 3:54:48 PM By: Linton Ham MD Entered By: Donnamarie Poag on 05/01/2021 11:47:20 Ralph Peterson ([id number]) -------------------------------------------------------------------------------- Problem List Details Patient Name: Ralph Peterson. Date of Service: 05/01/2021 11:30 AM Medical Record Number: [id number] Patient Account Number: [id number] Date of Birth/Sex: 1965/12/05 (55 y.o.  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 M) Treating RN: Donnamarie Poag Primary Care Provider: Vidal Schwalbe Other Clinician: Referring Provider: Vidal Schwalbe Treating Provider/Extender: Tito Dine in Treatment: 1 Subjective History of Present Illness (HPI) 08/17/2020 upon evaluation today patient actually appears to be doing decently well today which is good news in regard to the dorsal wound which is almost healed and the lateral wound also doing much better. With regard to the plantar wound this is still draining purulent drainage unfortunately the culture did reveal that he had Pseudomonas as well as Enterococcus. With that being said he is allergic to penicillin which took away the only  oral medication for the Enterococcus I did look at linezolid as a possibility but unfortunately there were medication interactions. Also the same was true the Cipro where there were medication interactions. Nonetheless I want to see if we can get him into infectious disease to see if they can work with him to try to find possibly add at home IV antibiotic regimen that can help out at this point. No sharp debridement is can be necessary today. 09/07/2020 upon evaluation today patient appears to be doing unfortunately not too well in regard to his wound. He has been tolerating the dressing changes without complication. Fortunately there is no sign of active infection at this time. No fevers, chills, nausea, vomiting, or diarrhea. The patient did have a repeat surgery going to clean out the abscess and try to work on getting things moving in a better direction about a week and a half ago. He still has sutures in place. With that being said unfortunately he does not appear to be showing signs of a lot of improvement in my opinion. He does have sutures on the plantar aspect still in place and on the dorsal aspect he has a small opening which actually probes all the way down through and actually came out of the plantar aspect. I feel like that he really  is having a lot of issues here and I am afraid that if we do not get this taken care of this is can end up with him having a much larger and more extensive amputation than what he was hoping to have to deal with with this. It has already been since June 2021 but has been dealing with this including all the time since the amputation. 09/14/2020 upon evaluation today patient appears to be doing really about the same in regard to the wound on the dorsal surface of his foot. The plantar foot unfortunately still has sutures in place I really cannot see how things are doing in that regard. With the dorsal foot however this appears to show signs of still having a  small opening in the distal aspect which does actually probe down to bone there is also significant purulent drainage we did actually obtain a wound culture today to further evaluate the issue here. 09/21/2020 upon evaluation today patient appears to be doing about the same in regard to his wound on the dorsal surface of his foot and the plantar foot he still does not have the sutures out at this point. That should hopefully happen in the next week. With that being said the main issue is he continues to have drainage here. It was noted that he had positive findings on culture for Enterobacter. With that being said he cannot take oral penicillin he could do vancomycin and I think that is good to be the ideal thing this to see if we can get him started on IV vancomycin at dialysis. We will get a get in touch with them to try to see if we can coordinate that being done there at the dialysis center. I think that is the ideal situation at this point I would probably start with just 2 weeks of therapy. 09/28/2020 upon evaluation today patient appears to be doing well with regard to his wounds in general. He also has back on the vancomycin after the culture results and this seems to be doing excellent for him. All the wounds are showing signs of good improvement. No fevers, chills, nausea, vomiting, or diarrhea. 10/05/2020 upon evaluation today patient appears to actually be doing excellent in regard to his foot. Fortunately there is a lot of signs of new granulation epithelization at this point. There does not appear to be any evidence of active infection which is great news and overall very pleased with where things stand. In fact the open wound along the plantar aspect of his foot as well as the dorsal part of his foot has dramatically improved since we went forward with the vancomycin and requested that the dialysis center provide this to him by way of IV antibiotics with his dialysis. Overall I think that  has made a dramatic improvement and overall his wound seems to be doing significantly better at this point. No fevers, chills, nausea, vomiting, or diarrhea. The patient was set to have his second opinion appointment with podiatry [facility] next week but to be perfectly honest I am not even certain that that is necessary at this point I do not think there is much that they would be able to do for him nor that he really would want them to do considering how well things look at this time. 10/12/2020 on evaluation today patient appears to be doing excellent in regard to his wounds. In fact the only issue I see is that everything is dried out as far as the collagen  is concerned. I do think we can try to keep this moist he will continue to make excellent progress as he has been. Overall I am extremely pleased with what I see today. 10/19/2020 upon evaluation today patient appears to be doing decently well in regard to the wounds on his foot he is can require some debridement in this location. Unfortunately is having some issues with blistering over the leg which is of greater concern at this point. Fortunately I do not see any signs of active infection which is great news. Nonetheless the blistering may just be related to venous insufficiency or subsequently could potentially be an issue with overall worsening with regard to an infection but again it is not really hot to touch. 10/26/2020 upon evaluation today patient appears to be doing well with regard to the majority of his wounds. Unfortunately the dorsal surface of his foot does not appear to be doing quite as well today compared to what it has been doing. I do think that we will need to address that currently. Everything else is showing signs of improvement. He did not get the prescription that I gave him previous as unfortunately the transmission failed. I did not know that until today when the patient mention to me that he never got the antibiotic. 11/02/20  upon evaluation today patient appears to be doing much better in regard to his wounds in general. I feel like that he is making good progress and even the top of the foot does not appear to be doing too poorly at this time. He did finally get the antibiotics which is good. Unfortunately it does not look like I am able to electronically sent to his pharmacy it kept being an error in transmission every time this was sent. We finally decided to call it in. Nonetheless overall I think he is headed in the right direction based on what I am seeing. 11/09/2020 upon evaluation today patient appears to be doing well in general in regard to his wounds. I think that he is making some progress here. With that being said there is definitely still some areas of concern. Specifically the dorsal aspect of his foot distally and the lateral aspect of his foot proximally. Both of these areas are still open everything else appears to likely be closed today based on what I am seeing. 4/28; the patient really looks quite good. The area over the base of the right fifth metatarsal and the base of the fifth MTP both look healed to me. 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 They are using Hydrofera Blue rope but I think this is far too small wound now for this 11/23/2020 upon evaluation today patient actually appears to be doing excellent. In fact I do not really see anything that is actually open anywhere at this point. With that being said I am a little reluctant to completely just healing out and discharge him based on the dorsal foot wound being closed even though I do not see anything open we have been there down this road before where things would reopen causing additional problems. Nonetheless I think that we need to monitor him a little bit more closely to make sure nothing worsens again. 11/30/2020 upon  evaluation today patient actually appears to be doing quite well in regard to his wound at this point. He has been tolerating the dressing changes  without complication which is great news and overall I am extremely pleased with where things stand today. No fevers, chills, nausea, vomiting, or diarrhea. Readmission: 04/24/2021 upon evaluation today patient presents for reevaluation here in the clinic this is a patient that I have previously taken care of when he had surgical wounds that had to be healed and this was several months back he in fact healed I believe in May 2022. Subsequently he has an issue now with his foot as he has been walking around more and this has caused an abnormal pressure location. This again is on the foot where he had all the surgery previous. That is on the right plantar foot. With that being said unfortunately the pati having a significant issue here with pressure ent does seem to be which I think is not can be the easiest thing to manage due to the shape of his foot. He is supposed to be getting new diabetic shoes fitted but right now he still has the ones currently that he has been you been using previously. Otherwise none of his major medical problems have shifted at this point. 10/11; second visit for this man who has a area right plantar foot at roughly the fourth metatarsal head. We have been using Hydrofera Blue and a surgical shoe. Objective Constitutional Sitting or standing Blood Pressure is within target range for patient.. Pulse regular and within target range for patient.Marland Kitchen Respirations regular, non- labored and within target range.. Temperature is normal and within the target range for the patient.Marland Kitchen appears in no distress. Vitals Time Taken: 11:25 AM, Height: 72 in, Weight: 219 lbs, BMI: 29.7, Temperature: 98.4 F, Pulse: 98 bpm, Respiratory Rate: 16 breaths/min, Blood Pressure: 137/85 mmHg. General Notes: Wound exam; the granulation does not look too bad  on the surface of the wound. He does have a few millimeters of circumferential undermining. There is no evidence of infection. I used a #5 curette to remove the overhanging skin and subcutaneous tissue minimal bleeding Integumentary (Hair, Skin) Wound #10 status is Open. Original cause of wound was Blister. The date acquired was: 04/10/2021. The wound has been in treatment 1 weeks. The wound is located on the [place]. The wound measures 0.9cm length x 1cm width x 0.3cm depth; 0.707cm^2 area and 0.212cm^3 volume. There is Fat Layer (Subcutaneous Tissue) exposed. There is no tunneling noted, however, there is undermining starting at 12:00 and ending at 12:00 with a maximum distance of 0.2cm. There is a medium amount of serosanguineous drainage noted. The wound margin is thickened. There is large (67-100%) red, pink granulation within the wound bed. There is a small (1-33%) amount of necrotic tissue within the wound bed including Adherent Slough. Assessment Active Problems ICD-10 Type 2 diabetes mellitus with foot ulcer Non-pressure chronic ulcer of other part of right foot with fat layer exposed Paroxysmal atrial fibrillation Long term (current) use of anticoagulants Chronic combined systolic (congestive) and diastolic (congestive) heart failure End stage renal disease Dependence on renal dialysis BEOWULF, REPETTI. ([id number]) Procedures Wound #10 Pre-procedure diagnosis of Wound #10 is a Diabetic Wound/Ulcer of the Lower Extremity located on the Right,Distal,Plantar Foot .Severity of Tissue Pre Debridement is: Fat layer exposed. There was a Excisional Skin/Subcutaneous Tissue Debridement with a total area of 1.1 sq cm performed by Ricard Dillon, MD. With the following instrument(s): Curette to remove Viable and Non-Viable tissue/material. Material removed includes Callus, Subcutaneous Tissue, and Slough after achieving pain control using Lidocaine. A time out was  conducted at  11:42, prior to the start of the procedure. A Minimum amount of bleeding was controlled with Pressure. The procedure was tolerated well. Post Debridement Measurements: 1.1cm length x 1cm width x 0.3cm depth; 0.259cm^3 volume. Character of Wound/Ulcer Post Debridement is improved. Severity of Tissue Post Debridement is: Fat layer exposed. Post procedure Diagnosis Wound #10: Same as Pre-Procedure Plan Follow-up Appointments: Return Appointment in 1 week. Bathing/ Shower/ Hygiene: May shower; gently cleanse wound with antibacterial soap, rinse and pat dry prior to dressing wounds - keep dressing dry/change after bathing May shower with wound dressing protected with water repellent cover or cast protector. No tub bath. Off-Loading: Total Contact Cast to Right Lower Extremity - May consider TCC at next appt, please bring a driver Other: - offload insert from [facility] WOUND #10: - Foot Wound Laterality: Plantar, Right, Distal Cleanser: Soap and Water Every Other Day/15 Days Discharge Instructions: Gently cleanse wound with antibacterial soap, rinse and pat dry prior to dressing wounds Primary Dressing: Hydrofera Blue Ready Transfer Foam, 2.5x2.5 (in/in) (Generic) Every Other Day/15 Days Discharge Instructions: Apply Hydrofera Blue Ready to wound bed as directed Secondary Dressing: ABD Pad 5x9 (in/in) Every Other Day/15 Days Discharge Instructions: Cover with ABD pad Secured With: 56M Medipore H Soft Cloth Surgical Tape, 2x2 (in/yd) Every Other Day/15 Days Secured With: [employer] Sterile or Non-Sterile 6-ply 4.5x4 (yd/yd) (Generic) Every Other Day/15 Days Discharge Instructions: Apply Kerlix as directed I did not change the primary treatment today which is Hydrofera Blue ABD with a healing sandal with felt offloading. I agree with the thought of applying a total contact cast told the patient to come repeat come prepared for that next week including somebody to drive him home. 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 Date of Service: 05/01/2021 Medical Record Number: [id number] Patient Account Number: [id number] Date of Birth/Sex: 1965/11/14 (54 y.o. M) Treating RN: Donnamarie Poag Primary Care Provider: Vidal Schwalbe Other Clinician: Referring Provider: Vidal Schwalbe Treating Provider/Extender: Tito Dine in Treatment: 1 Diagnosis Coding ICD-10 Codes Code Description E11.621 Type 2 diabetes mellitus with foot ulcer 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d I48.0 Paroxysmal atrial fibrillation Z79.01 Long term (current) use of anticoagulants I50.42 Chronic combined systolic (congestive) and diastolic (congestive) heart failure N18.6 End stage renal disease Z99.2 Dependence on renal dialysis Facility Procedures CPT4 Code: [id number] Description: [id number] - DEB SUBQ TISSUE 20 SQ CM/< Modifier: Quantity: 1 CPT4 Code: Description: ICD-10 Diagnosis Description L97.512 Non-pressure chronic ulcer of other part of right foot with fat layer ex Modifier: posed Quantity: Physician Procedures CPT4 [id number] Description: 11042 - WC PHYS SUBQ TISS 20 SQ CM Modifier: Quantity: 1 CPT4 Code: Description: ICD-10 Diagnosis Description L97.512 Non-pressure chronic ulcer of other part of right foot with fat layer ex Modifier: posed Quantity: Electronic Signature(s) Signed: 05/01/2021 3:54:48 PM By: Linton Ham MD Entered By: Linton Ham on 05/01/2021 11:58:08

## 2021-05-08 ENCOUNTER — Other Ambulatory Visit: Payer: Self-pay

## 2021-05-08 ENCOUNTER — Ambulatory Visit: Payer: Medicare PPO | Admitting: Podiatry

## 2021-05-08 ENCOUNTER — Encounter: Payer: Medicare PPO | Admitting: Physician Assistant

## 2021-05-08 DIAGNOSIS — E11621 Type 2 diabetes mellitus with foot ulcer: Secondary | ICD-10-CM | POA: Diagnosis not present

## 2021-05-08 NOTE — Progress Notes (Addendum)
YANIEL, FOSSELMAN ([id number]) Visit Report for 05/08/2021 Arrival Information Details Patient Name: Ralph Peterson, Ralph Peterson. Date of Service: 05/08/2021 1:00 PM Medical Record Number: [id number] Patient Account Number: [id number] Date of Birth/Sex: [birth year] (55 y.o. M) Treating RN: Cornell Barman Primary Care Juliyah Mergen: Vidal Schwalbe Other Clinician: Referring Jeniya Flannigan: Vidal Schwalbe Treating Martia Dalby/Extender: Skipper Cliche in Treatment: 2 Visit Information History Since Last Visit Has Dressing in Place as Prescribed: Yes Patient Arrived: Ambulatory Has Footwear/Offloading in Place as Prescribed: Yes Arrival Time: 13:04 Right: Surgical Shoe with Pressure Relief Accompanied By: self Insole Transfer Assistance: None Pain Present Now: No Patient Identification Verified: Yes Secondary Verification Process Completed: Yes Patient Has Alerts: Yes Patient Alerts: Patient on Blood Thinner Eliquis Diabetic AVVS 07/2020 ABI [facility]=[place] Electronic Signature(s) Signed: 05/08/2021 5:32:40 PM By: Gretta Cool, BSN, RN, CWS, Kim RN, BSN Entered By: Gretta Cool, BSN, RN, CWS, Kim on 05/08/2021 13:04:37 Ralph Peterson ([id number]) -------------------------------------------------------------------------------- Encounter Discharge Information Details Patient Name: Ralph Peterson. Date of Service: 05/08/2021 1:00 PM Medical Record Number: [id number] Patient Account Number: [id number] Date of Birth/Sex: [birth year] (55 y.o. M) Treating RN: Cornell Barman Primary Care Kiyaan Haq: Vidal Schwalbe Other Clinician: Referring Brittyn Salaz: Vidal Schwalbe Treating Abdishakur Gottschall/Extender: Skipper Cliche in Treatment: 2 Encounter Discharge Information Items Post Procedure Vitals Discharge Condition: Stable Unable to obtain vitals Reason: limited time Ambulatory Status: Ambulatory Discharge Destination: Home Transportation: Private Auto Accompanied By: self Schedule Follow-up Appointment: Yes Clinical  Summary of Care: Electronic Signature(s) Signed: 05/08/2021 5:32:07 PM By: Gretta Cool, BSN, RN, CWS, Kim RN, BSN Entered By: Gretta Cool, BSN, RN, CWS, Kim on 05/08/2021 17:32:06 Ralph Peterson ([id number]) -------------------------------------------------------------------------------- Lower Extremity Assessment Details Patient Name: Ralph Peterson. Date of Service: 05/08/2021 1:00 PM Medical Record Number: [id number] Patient Account Number: [id number] Date of Birth/Sex: 1966/07/12 (55 y.o. M) Treating RN: Cornell Barman Primary Care Ramzi Brathwaite: Vidal Schwalbe Other Clinician: Referring Kaleeya Hancock: Vidal Schwalbe Treating Tullio Chausse/Extender: Skipper Cliche in Treatment: 2 Vascular Assessment Pulses: Dorsalis Pedis Palpable: [Right:Yes] Electronic Signature(s) Signed: 05/08/2021 5:32:40 PM By: Gretta Cool, BSN, RN, CWS, Kim RN, BSN Entered By: Gretta Cool, BSN, RN, CWS, Kim on 05/08/2021 13:13:06 Ralph Peterson ([id number]) -------------------------------------------------------------------------------- Multi Wound Chart Details Patient Name: Ralph Peterson Date of Service: 05/08/2021 1:00 PM Medical Record Number: [id number] Patient Account Number: [id number] Date of Birth/Sex: [birth year] (55 y.o. M) Treating RN: Cornell Barman Primary Care Peg Fifer: Vidal Schwalbe Other Clinician: Referring Dniya Neuhaus: Vidal Schwalbe Treating Natan Hartog/Extender: Skipper Cliche in Treatment: 2 Vital Signs Height(in): 72 Pulse(bpm): 59 Weight(lbs): 219 Blood Pressure(mmHg): 138/93 Body Mass Index(BMI): 30 Temperature(F): 98.4 Respiratory Rate(breaths/min): 16 Photos: [N/A:N/A] Wound Location: Right, Distal, Plantar Foot N/A N/A Wounding Event: Blister N/A N/A Primary Etiology: Diabetic Wound/Ulcer of the Lower N/A N/A Extremity Comorbid History: Arrhythmia, Congestive Heart N/A N/A Failure, Coronary Artery Disease, Hypertension, Myocardial Infarction, Peripheral Venous Disease, Type  II Diabetes, Neuropathy Date Acquired: 04/10/2021 N/A N/A Weeks of Treatment: 2 N/A N/A Wound Status: Open N/A N/A Measurements L x W x D (cm) 1x1.2x0.5 N/A N/A Area (cm) : 0.942 N/A N/A Volume (cm) : 0.471 N/A N/A % Reduction in Area: -50.00% N/A N/A % Reduction in Volume: -273.80% N/A N/A Starting Position 1 (o'clock): 7 Ending Position 1 (o'clock): 4 Maximum Distance 1 (cm): 0.4 Undermining: Yes N/A N/A Classification: Grade 2 N/A N/A Exudate Amount: Medium N/A N/A Exudate Type: Serosanguineous N/A N/A Exudate Color: red, brown N/A N/A Wound Margin: Thickened N/A N/A Granulation Amount: Large (67-100%) N/A N/A Granulation Quality: Red, Pink N/A N/A Necrotic Amount: Small (1-33%) N/A  N/A Exposed Structures: Fat Layer (Subcutaneous Tissue): N/A N/A Yes Fascia: No Tendon: No Muscle: No Joint: No Bone: No Epithelialization: None N/A N/A Treatment Notes FOY, LIKENS ([id number]) Electronic Signature(s) Signed: 05/08/2021 5:32:40 PM By: Gretta Cool, BSN, RN, CWS, Kim RN, BSN Entered By: Gretta Cool, BSN, RN, CWS, Kim on 05/08/2021 13:15:39 Ralph Peterson ([id number]) -------------------------------------------------------------------------------- Multi-Disciplinary Care Plan Details Patient Name: JULIANA, WHELAN. Date of Service: 05/08/2021 1:00 PM Medical Record Number: [id number] Patient Account Number: [id number] Date of Birth/Sex: 1966/01/02 (55 y.o. M) Treating RN: Cornell Barman Primary Care Erienne Spelman: Vidal Schwalbe Other Clinician: Referring Jakwon Gayton: Vidal Schwalbe Treating Rye Dorado/Extender: Skipper Cliche in Treatment: 2 Active Inactive Wound/Skin Impairment Nursing Diagnoses: Impaired tissue integrity Knowledge deficit related to smoking impact on wound healing Knowledge deficit related to ulceration/compromised skin integrity Goals: Patient/caregiver will verbalize understanding of skin care regimen Date Initiated: 04/24/2021 Target Resolution  Date: 05/11/2021 Goal Status: Active Ulcer/skin breakdown will have a volume reduction of 30% by week 4 Date Initiated: 04/24/2021 Target Resolution Date: 05/24/2021 Goal Status: Active Ulcer/skin breakdown will have a volume reduction of 50% by week 8 Date Initiated: 04/24/2021 Target Resolution Date: 06/23/2021 Goal Status: Active Ulcer/skin breakdown will have a volume reduction of 80% by week 12 Date Initiated: 04/24/2021 Target Resolution Date: 07/24/2021 Goal Status: Active Ulcer/skin breakdown will heal within 14 weeks Date Initiated: 04/24/2021 Target Resolution Date: 08/05/2021 Goal Status: Active Interventions: Assess patient/caregiver ability to obtain necessary supplies Assess patient/caregiver ability to perform ulcer/skin care regimen upon admission and as needed Assess ulceration(s) every visit Notes: Electronic Signature(s) Signed: 05/08/2021 5:32:40 PM By: Gretta Cool, BSN, RN, CWS, Kim RN, BSN Entered By: Gretta Cool, BSN, RN, CWS, Kim on 05/08/2021 13:13:12 Ralph Peterson ([id number]) -------------------------------------------------------------------------------- Pain Assessment Details Patient Name: Ralph Peterson Date of Service: 05/08/2021 1:00 PM Medical Record Number: [id number] Patient Account Number: [id number] Date of Birth/Sex: June 23, 1966 (55 y.o. M) Treating RN: Cornell Barman Primary Care Nguyen Todorov: Vidal Schwalbe Other Clinician: Referring Ifeanyi Mickelson: Vidal Schwalbe Treating Chany Woolworth/Extender: Skipper Cliche in Treatment: 2 Active Problems Location of Pain Severity and Description of Pain Patient Has Paino No Site Locations Pain Management and Medication Current Pain Management: Notes Patient denies pain at this time. Electronic Signature(s) Signed: 05/08/2021 5:32:40 PM By: Gretta Cool, BSN, RN, CWS, Kim RN, BSN Entered By: Gretta Cool, BSN, RN, CWS, Kim on 05/08/2021 13:05:50 Ralph Peterson  ([id number]) -------------------------------------------------------------------------------- Patient/Caregiver Education Details Patient Name: Ralph Peterson Date of Service: 05/08/2021 1:00 PM Medical Record Number: [id number] Patient Account Number: [id number] Date of Birth/Gender: 1966/06/14 (55 y.o. M) Treating RN: Cornell Barman Primary Care Physician: Vidal Schwalbe Other Clinician: Referring Physician: Vidal Schwalbe Treating Physician/Extender: Skipper Cliche in Treatment: 2 Education Assessment Education Provided To: Patient Education Topics Provided Offloading: Handouts: What is Offloadingo, Other: Do not get cast wet. Call immediately if cast is rubbing or feels uncomfortable. Methods: Demonstration, Explain/Verbal Responses: State content correctly Electronic Signature(s) Signed: 05/08/2021 5:32:40 PM By: Gretta Cool, BSN, RN, CWS, Kim RN, BSN Entered By: Gretta Cool, BSN, RN, CWS, Kim on 05/08/2021 17:28:22 Ralph Peterson ([id number]) -------------------------------------------------------------------------------- Wound Assessment Details Patient Name: Ralph Peterson Date of Service: 05/08/2021 1:00 PM Medical Record Number: [id number] Patient Account Number: [id number] Date of Birth/Sex: 01/20/1966 (55 y.o. M) Treating RN: Cornell Barman Primary Care Saysha Menta: Vidal Schwalbe Other Clinician: Referring Arron Mcnaught: Vidal Schwalbe Treating Manmeet Arzola/Extender: Skipper Cliche in Treatment: 2 Wound Status Wound Number: 10 Primary Diabetic Wound/Ulcer of the Lower Extremity Etiology: Wound Location: Right, Distal, Plantar Foot Wound Open Wounding Event: Blister  Status: Date Acquired: 04/10/2021 Comorbid Arrhythmia, Congestive Heart Failure, Coronary Artery Weeks Of Treatment: 2 History: Disease, Hypertension, Myocardial Infarction, Peripheral Clustered Wound: No Venous Disease, Type II Diabetes, Neuropathy Photos Wound Measurements Length: (cm) 1 % R Width:  (cm) 1.2 % R Depth: (cm) 0.5 Epi Area: (cm) 0.942 Tu Volume: (cm) 0.471 Un eduction in Area: -50% eduction in Volume: -273.8% thelialization: None nneling: No dermining: Yes Starting Position (o'clock): 7 Ending Position (o'clock): 4 Maximum Distance: (cm) 0.4 Wound Description Classification: Grade 2 Fou Wound Margin: Thickened Slo Exudate Amount: Medium Exudate Type: Serosanguineous Exudate Color: red, brown l Odor After Cleansing: No ugh/Fibrino Yes Wound Bed Granulation Amount: Large (67-100%) Exposed Structure Granulation Quality: Red, Pink Fascia Exposed: No Necrotic Amount: Small (1-33%) Fat Layer (Subcutaneous Tissue) Exposed: Yes Necrotic Quality: Adherent Slough Tendon Exposed: No Muscle Exposed: No Joint Exposed: No Bone Exposed: No Treatment Notes Wound #10 (Foot) Wound Laterality: Plantar, Right, Distal Cleanser Mccormac, Kemet Chauncey Cruel ([id number]) Soap and Water Discharge Instruction: Gently cleanse wound with antibacterial soap, rinse and pat dry prior to dressing wounds Peri-Wound Care Topical Primary Dressing Hydrofera Blue Ready Transfer Foam, 2.5x2.5 (in/in) Discharge Instruction: Apply Hydrofera Blue Ready to wound bed as directed Secondary Dressing Foam Dressing, 4x4 (in/in) Secured With Compression Wrap Compression Stockings Add-Ons Notes TCC cast applied to right leg Electronic Signature(s) Signed: 05/08/2021 5:32:40 PM By: Gretta Cool, BSN, RN, CWS, Kim RN, BSN Entered By: Gretta Cool, BSN, RN, CWS, Kim on 05/08/2021 13:26:23 Ralph Peterson ([id number]) -------------------------------------------------------------------------------- [place] Details Patient Name: Ralph Peterson. Date of Service: 05/08/2021 1:00 PM Medical Record Number: [id number] Patient Account Number: [id number] Date of Birth/Sex: August 29, 1965 (55 y.o. M) Treating RN: Cornell Barman Primary Care Dawnelle Warman: Vidal Schwalbe Other Clinician: Referring Nimrod Wendt: Vidal Schwalbe Treating Shreyansh Tiffany/Extender: Skipper Cliche in Treatment: 2 Vital Signs Time Taken: 13:04 Temperature (F): 98.4 Height (in): 72 Pulse (bpm): 86 Weight (lbs): 219 Respiratory Rate (breaths/min): 16 Body Mass Index (BMI): 29.7 Blood Pressure (mmHg): 138/93 Reference Range: 80 - 120 mg / dl Electronic Signature(s) Signed: 05/08/2021 5:32:40 PM By: Gretta Cool, BSN, RN, CWS, Kim RN, BSN Entered By: Gretta Cool, BSN, RN, CWS, Kim on 05/08/2021 13:05:21

## 2021-05-08 NOTE — Progress Notes (Addendum)
VICKY, VALLIS ([id number]) Visit Report for 05/08/2021 Chief Complaint Document Details Patient Name: Ralph Peterson, Ralph Peterson. Date of Service: 05/08/2021 1:00 PM Medical Record Number: [id number] Patient Account Number: [id number] Date of Birth/Sex: 07-Jan-1966 (55 y.o. 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 M) Treating RN: Cornell Barman Primary Care Provider: Vidal Schwalbe Other Clinician: Referring Provider: Vidal Schwalbe Treating Provider/Extender: Skipper Cliche in Treatment: 2 Debridement Performed for Wound #10 Right,Distal,Plantar Foot Assessment: Performed By: Physician Tommie Sams., PA-C Debridement Type: Debridement Severity of Tissue Pre Debridement: Fat layer exposed Level of Consciousness (Pre- Awake and Alert procedure): Pre-procedure Verification/Time Out Yes - 13:21 Taken: Total Area Debrided (L x W): 1.5 (cm) x 2.2 (cm) = 3.3 (cm) Tissue and other material Viable, Non-Viable, Callus, Slough, Subcutaneous, Slough debrided: Level: Skin/Subcutaneous Tissue Debridement Description: Excisional Instrument: Curette Bleeding: Minimum Hemostasis Achieved: Pressure Response to Treatment: Procedure was tolerated well Level of Consciousness  (Post- Awake and Alert procedure): Post Debridement Measurements of Total Wound Length: (cm) 1.5 Width: (cm) 2.2 Depth: (cm) 0.6 Volume: (cm) 1.555 Character of Wound/Ulcer Post Debridement: Stable Severity of Tissue Post Debridement: Fat layer exposed Post Procedure Diagnosis Same as Pre-procedure Electronic Signature(s) Signed: 05/08/2021 4:43:23 PM By: Worthy Keeler PA-C Signed: 05/08/2021 5:32:40 PM By: Gretta Cool, BSN, RN, CWS, Kim RN, BSN Entered By: Gretta Cool, BSN, RN, CWS, Kim on 05/08/2021 13:25:22 Ralph Peterson ([id number]) -------------------------------------------------------------------------------- HPI Details Patient Name: Ralph Peterson. Date of Service: 05/08/2021 1:00 PM Medical Record Number: [id number] Patient Account Number: [id number] Date of Birth/Sex: 10-15-1965 (55 y.o. M) Treating RN: Cornell Barman Primary Care Provider: Vidal Schwalbe Other Clinician: Referring Provider: Vidal Schwalbe Treating Provider/Extender: Skipper Cliche in Treatment: 2 History of Present Illness HPI Description: 08/17/2020 upon evaluation today patient actually appears to be doing decently well today which is good news in regard to the dorsal wound which is almost healed and the lateral wound also doing much better. With regard to the plantar wound this is still draining purulent drainage unfortunately the culture did reveal that he had Pseudomonas as well as Enterococcus. With that being said he is allergic to penicillin which took away the only oral medication for the Enterococcus I did look at linezolid as a possibility but unfortunately there were medication interactions. Also the same was true the Cipro where there were medication interactions. Nonetheless I want to see if we can get him into infectious disease to see if they can work with him to try to find possibly add at home IV antibiotic regimen that can help out at this point. No sharp debridement is can be  necessary today. 09/07/2020 upon evaluation today patient appears to be doing unfortunately not too well in regard to his wound. He has been tolerating the dressing changes without complication. Fortunately there is no sign of active infection at this time. No fevers, chills, nausea, vomiting, or diarrhea. The patient did have a repeat surgery going to clean out the abscess and try to work on getting  things moving in a better direction about a week and a half ago. He still has sutures in place. With that being said unfortunately he does not appear to be showing signs of a lot of improvement in my opinion. He does have sutures on the plantar aspect still in place and on the dorsal aspect he has a small opening which actually probes all the way down through and actually came out of the plantar aspect. I feel like that he really is having a lot of issues here and I am afraid that if we do not get this taken care of this is can end up with him having a much larger and more extensive amputation than what he was hoping to have to deal with with this. It has already been since June 2021 but has been dealing with this including all the time since the amputation. 09/14/2020 upon evaluation today patient appears to be doing really about the same in regard to the wound on the dorsal surface of his foot. The plantar foot unfortunately still has sutures in place I really cannot see how things are doing in that regard. With the dorsal foot however this appears to show signs of still having a small opening in the distal aspect which does actually probe down to bone there is also significant purulent drainage we did actually obtain a wound culture today to further evaluate the issue here. 09/21/2020 upon evaluation today patient appears to be doing about the same in regard to his wound on the dorsal surface of his foot and the plantar foot he still does not have the sutures out at this point. That should hopefully happen  in the next week. With that being said the main issue is he continues to have drainage here. It was noted that he had positive findings on culture for Enterobacter. With that being said he cannot take oral penicillin he could do vancomycin and I think that is good to be the ideal thing this to see if we can get him started on IV vancomycin at dialysis. We will get a get in touch with them to try to see if we can coordinate that being done there at the dialysis center. I think that is the ideal situation at this point I would probably start with just 2 weeks of therapy. 09/28/2020 upon evaluation today patient appears to be doing well with regard to his wounds in general. He also has back on the vancomycin after the culture results and this seems to be doing excellent for him. All the wounds are showing signs of good improvement. No fevers, chills, nausea, vomiting, or diarrhea. 10/05/2020 upon evaluation today patient appears to actually be doing excellent in regard to his foot. Fortunately there is a lot of signs of new granulation epithelization at this point. There does not appear to be any evidence of active infection which is great news and overall very pleased with where things stand. In fact the open wound along the plantar aspect of his foot as well as the dorsal part of his foot has dramatically improved since we went forward with the vancomycin and requested that the dialysis center provide this to him by way of IV antibiotics with his dialysis. Overall I think that has made a dramatic improvement and overall his wound seems to be doing significantly better at this point. No fevers, chills, nausea, vomiting, or diarrhea. The patient was set to have his second opinion appointment with podiatry [facility] next week but  to be perfectly honest I am not even certain that that is necessary at this point I do not think there is much that they would be able to do for him nor that he really would want them to  do considering how well things look at this time. 10/12/2020 on evaluation today patient appears to be doing excellent in regard to his wounds. In fact the only issue I see is that everything is dried out as far as the collagen is concerned. I do think we can try to keep this moist he will continue to make excellent progress as he has been. Overall I am extremely pleased with what I see today. 10/19/2020 upon evaluation today patient appears to be doing decently well in regard to the wounds on his foot he is can require some debridement in this location. Unfortunately is having some issues with blistering over the leg which is of greater concern at this point. Fortunately I do not see any signs of active infection which is great news. Nonetheless the blistering may just be related to venous insufficiency or subsequently could potentially be an issue with overall worsening with regard to an infection but again it is not really hot to touch. 10/26/2020 upon evaluation today patient appears to be doing well with regard to the majority of his wounds. Unfortunately the dorsal surface of his foot does not appear to be doing quite as well today compared to what it has been doing. I do think that we will need to address that currently. Everything else is showing signs of improvement. He did not get the prescription that I gave him previous as unfortunately the transmission failed. I did not know that until today when the patient mention to me that he never got the antibiotic. 11/02/20 upon evaluation today patient appears to be doing much better in regard to his wounds in general. I feel like that he is making good progress and even the top of the foot does not appear to be doing too poorly at this time. He did finally get the antibiotics which is good. Unfortunately it does not look like I am able to electronically sent to his pharmacy it kept being an error in transmission every time this was sent. We finally  decided to call it in. Nonetheless overall I think he is headed in the right direction based on what I am seeing. 11/09/2020 upon evaluation today patient appears to be doing well in general in regard to his wounds. I think that he is making some progress here. With that being said there is definitely still some areas of concern. Specifically the dorsal aspect of his foot distally and the lateral aspect of his foot proximally. Both of these areas are still open everything else appears to likely be closed today based on what I am seeing. 4/28; the patient really looks quite good. The area over the base of the right fifth metatarsal and the base of the fifth MTP both look healed to me. 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 They are using Hydrofera Blue rope but I think this is far too small wound now for this 11/23/2020 upon evaluation today patient actually appears to be doing excellent. In fact I do not really see anything that is actually open anywhere at this point. With that being said I am a little reluctant to completely just healing  out and discharge him based on the dorsal foot wound being closed even though I do not see anything open we have been there down this road before where things would reopen causing additional problems. Nonetheless I think that we need to monitor him a little bit more closely to make sure nothing worsens again. 11/30/2020 upon evaluation today patient actually appears to be doing quite well in regard to his wound at this point. He has been tolerating the dressing changes without complication which is great news and overall I am extremely pleased with where things stand today. No fevers, chills, nausea, vomiting, or diarrhea. Readmission: 04/24/2021 upon evaluation today patient presents for reevaluation here in the clinic this is a patient that I have  previously taken care of when he had surgical wounds that had to be healed and this was several months back he in fact healed I believe in May 2022. Subsequently he has an issue now with his foot as he has been walking around more and this has caused an abnormal pressure location. This again is on the foot where he had all the surgery previous. That is on the right plantar foot. With that being said unfortunately the pati having a significant issue here with pressure ent does seem to be which I think is not can be the easiest thing to manage due to the shape of his foot. He is supposed to be getting new diabetic shoes fitted but right now he still has the ones currently that he has been you been using previously. Otherwise none of his major medical problems have shifted at this point. 10/11; second visit for this man who has a area right plantar foot at roughly the fourth metatarsal head. We have been using Hydrofera Blue and a surgical shoe. 05/08/2021 upon evaluation today patient's wound is actually showing signs of being a little bit larger he has a lot of callus around the edges of the wound that is can have to be cleaned away this is some undermining region. Nonetheless I think if we get that cleared away we can actually go and see about put him in the total contact cast with Dr. Dellia Nims discussed with him last week. Electronic Signature(s) Signed: 05/08/2021 1:28:45 PM By: Worthy Keeler PA-C Entered By: Worthy Keeler on 05/08/2021 13:28:44 Ralph Peterson ([id number]) -------------------------------------------------------------------------------- Physical Exam Details Patient Name: Ralph Peterson Date of Service: 05/08/2021 1:00 PM Medical Record Number: [id number] Patient Account Number: [id number] Date of Birth/Sex: 09-02-1965 (55 y.o. M) Treating RN: Cornell Barman Primary Care Provider: Vidal Schwalbe Other Clinician: Referring Provider: Vidal Schwalbe Treating  Provider/Extender: Skipper Cliche in Treatment: 2 Constitutional Well-nourished and well-hydrated in no acute distress. Respiratory normal breathing without difficulty. Psychiatric this patient is able to make decisions and demonstrates good insight into disease process. Alert and Oriented x 3. pleasant and cooperative. Notes Patient's wound bed did require sharp debridement to clear away some of the necrotic debris from the surface of the wound as well as undermining and callus around the edges of the wound. Post debridement we did take a picture as well and the good news is the patient's wound seems to be doing better I do believe the cast is getting help with preventing any significant pressure and allow this to heal much more effectively and quickly. Electronic Signature(s) Signed: 05/08/2021 1:29:08 PM By: Worthy Keeler PA-C Entered By: Worthy Keeler on 05/08/2021 13:29:08 Ralph Peterson ([id number]) -------------------------------------------------------------------------------- Physician Orders Details Patient Name: Ralph Barrios  Terryon S. Date of Service: 05/08/2021 1:00 PM Medical Record Number: [id number] Patient Account Number: [id number] Date of Birth/Sex: 1966/04/21 (55 y.o. M) Treating RN: Cornell Barman Primary Care Provider: Vidal Schwalbe Other Clinician: Referring Provider: Vidal Schwalbe Treating Provider/Extender: Skipper Cliche in Treatment: 2 Verbal / Phone Orders: No Diagnosis Coding ICD-10 Coding Code Description E11.621 Type 2 diabetes mellitus with foot ulcer 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d I48.0 Paroxysmal atrial fibrillation Z79.01 Long term (current) use of anticoagulants I50.42 Chronic combined systolic (congestive) and diastolic (congestive) heart failure 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 o  Return Appointment in: - Thursday 05/10/2021 for cast change Bathing/ Shower/ Hygiene o May shower with wound dressing protected with water repellent cover or cast protector. - Do not get cast wet. If it gets wet call us immediately. If on weekend, go to urgent care or Emergency department for removal. o No tub bath. Off-Loading o Total Contact Cast to Right Lower Extremity o Other: - offload insert from [facility] Wound Treatment Wound #10 - Foot Wound Laterality: Plantar, Right, Distal Primary Dressing: Hydrofera Blue Ready Transfer Foam, 2.5x2.5 (in/in) (Generic) Every Other Day/15 Days Discharge Instructions: Apply Hydrofera Blue Ready to wound bed as directed Secondary Dressing: Foam Dressing, 4x4 (in/in) Every Other Day/15 Days Electronic Signature(s) Signed: 05/08/2021 5:32:40 PM By: Gretta Cool, BSN, RN, CWS, Kim RN, BSN Signed: 05/11/2021 4:28:58 PM By: Worthy Keeler PA-C Previous Signature: 05/08/2021 5:26:19 PM Version By: Gretta Cool, BSN, RN, CWS, Kim RN, BSN Entered By: Gretta Cool, BSN, RN, CWS, Kim on 05/08/2021 17:31:42 Ralph Peterson ([id number]) -------------------------------------------------------------------------------- Problem List Details Patient Name: Ralph Peterson, BURROUS. Date of Service: 05/08/2021 1:00 PM Medical Record Number: [id number] Patient Account Number: [id number] Date of Birth/Sex: 1965-11-14 (55 y.o. M) Treating RN: Cornell Barman Primary Care Provider: Vidal Schwalbe Other Clinician: Referring Provider: Vidal Schwalbe Treating Provider/Extender: Skipper Cliche in Treatment: 2 Active Problems ICD-10 Encounter Code Description Active Date MDM Diagnosis E11.621 Type 2 diabetes mellitus with foot ulcer 04/24/2021 No Yes L97.512 Non-pressure chronic ulcer of other part of right foot with fat layer 04/24/2021 No Yes exposed I48.0 Paroxysmal atrial fibrillation 04/24/2021 No Yes Z79.01 Long term (current) use of anticoagulants 04/24/2021 No  Yes I50.42 Chronic combined systolic (congestive) and diastolic (congestive) heart 04/24/2021 No Yes failure N18.6 End stage renal disease 04/24/2021 No Yes Z99.2 Dependence on renal dialysis 04/24/2021 No Yes Inactive Problems Resolved Problems Electronic Signature(s) Signed: 05/08/2021 12:57:50 PM By: Worthy Keeler PA-C Entered By: Worthy Keeler on 05/08/2021 12:57:49 Ralph Peterson ([id number]) -------------------------------------------------------------------------------- Progress Note Details Patient Name: Ralph Peterson. Date of Service: 05/08/2021 1:00 PM Medical Record Number: [id number] Patient Account Number: [id number] Date of Birth/Sex: 1965/10/03 (55 y.o. 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 With regard to the plantar wound this is still draining purulent drainage unfortunately the culture did reveal that he had Pseudomonas as well as Enterococcus. With that being said he is allergic to  penicillin which took away the only oral medication for the Enterococcus I did look at linezolid as a possibility but unfortunately there were medication interactions. Also the same was true the Cipro where there were medication interactions. Nonetheless I want to see if we can get him into infectious disease to see if they can work with him to try to find possibly add at home IV antibiotic regimen that can help out at this point. No sharp debridement is can  be necessary today. 09/07/2020 upon evaluation today patient appears to be doing unfortunately not too well in regard to his wound. He has been tolerating the dressing changes without complication. Fortunately there is no sign of active infection at this time. No fevers, chills, nausea, vomiting, or diarrhea. The patient did have a repeat surgery going to clean out the abscess and try to work on getting things moving in a better direction about a week and a half ago. He still has sutures in place. With that being said unfortunately he does not appear to be showing signs of a lot of improvement in my opinion. He does have sutures on the plantar aspect still in place and on the dorsal aspect he has a small opening which actually probes all the way down through and actually came out of the plantar aspect. I feel like that he really is having a lot of issues here and I am afraid that if we do not get this taken care of this is can end up with him having a much larger and more extensive amputation than what he was hoping to have to deal with with this. It has already been since June 2021 but has been dealing with this including all the time since the amputation. 09/14/2020 upon evaluation today patient appears to be doing really about the same in regard to the wound on the dorsal surface of his foot. The plantar foot unfortunately still has sutures in place I really cannot see how things are doing in that regard. With the dorsal foot however this appears to show signs of still having a small opening in the distal aspect which does actually probe down to bone there is also significant purulent drainage we did actually obtain a wound culture today to further evaluate the issue here. 09/21/2020 upon evaluation today patient appears to be doing about the same in regard to his wound on the dorsal surface of his foot and the plantar foot he still does not have the sutures out at this point. That should hopefully  happen in the next week. With that being said the main issue is he continues to have drainage here. It was noted that he had positive findings on culture for Enterobacter. With that being said he cannot take oral penicillin he could do vancomycin and I think that is good to be the ideal thing this to see if we can get him started on IV vancomycin at dialysis. We will get a get in touch with them to try to see if we can coordinate that being done there at the dialysis center. I think that is the ideal situation at this point I would probably start with just 2 weeks of therapy. 09/28/2020 upon evaluation today patient appears to be doing well with regard to his wounds in general. He also has back on the vancomycin after the culture results and this seems to be doing excellent for him. All the wounds are showing signs of good improvement. No fevers, chills, nausea, vomiting,  or diarrhea. 10/05/2020 upon evaluation today patient appears to actually be doing excellent in regard to his foot. Fortunately there is a lot of signs of new granulation epithelization at this point. There does not appear to be any evidence of active infection which is great news and overall very pleased with where things stand. In fact the open wound along the plantar aspect of his foot as well as the dorsal part of his foot has dramatically improved since we went forward with the vancomycin and requested that the dialysis center provide this to him by way of IV antibiotics with his dialysis. Overall I think that has made a dramatic improvement and overall his wound seems to be doing significantly better at this point. No fevers, chills, nausea, vomiting, or diarrhea. The patient was set to have his second opinion appointment with podiatry [facility] next week but to be perfectly honest I am not even certain that that is necessary at this point I do not think there is much that they would be able to do for him nor that he really would want  them to do considering how well things look at this time. 10/12/2020 on evaluation today patient appears to be doing excellent in regard to his wounds. In fact the only issue I see is that everything is dried out as far as the collagen is concerned. I do think we can try to keep this moist he will continue to make excellent progress as he has been. Overall I am extremely pleased with what I see today. 10/19/2020 upon evaluation today patient appears to be doing decently well in regard to the wounds on his foot he is can require some debridement in this location. Unfortunately is having some issues with blistering over the leg which is of greater concern at this point. Fortunately I do not see any signs of active infection which is great news. Nonetheless the blistering may just be related to venous insufficiency or subsequently could potentially be an issue with overall worsening with regard to an infection but again it is not really hot to touch. 10/26/2020 upon evaluation today patient appears to be doing well with regard to the majority of his wounds. Unfortunately the dorsal surface of his foot does not appear to be doing quite as well today compared to what it has been doing. I do think that we will need to address that currently. Everything else is showing signs of improvement. He did not get the prescription that I gave him previous as unfortunately the transmission failed. I did not know that until today when the patient mention to me that he never got the antibiotic. 11/02/20 upon evaluation today patient appears to be doing much better in regard to his wounds in general. I feel like that he is making good progress and even the top of the foot does not appear to be doing too poorly at this time. He did finally get the antibiotics which is good. Unfortunately it does not look like I am able to electronically sent to his pharmacy it kept being an error in transmission every time this was sent. We  finally decided to call it in. Nonetheless overall I think he is headed in the right direction based on what I am seeing. Ralph Peterson, Ralph Peterson ([id number]) 11/09/2020 upon evaluation today patient appears to be doing well in general in regard to his wounds. I think that he is making some progress here. With that being said there is definitely still some areas  of concern. Specifically the dorsal aspect of his foot distally and the lateral aspect of his foot proximally. Both of these areas are still open everything else appears to likely be closed today based on what I am seeing. 4/28; the patient really looks quite good. The area over the base of the right fifth metatarsal and the base of the fifth MTP both look healed to me. 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 They are using Hydrofera Blue rope but I think this is far too small wound now for this 11/23/2020 upon evaluation today patient actually appears to be doing excellent. In fact I do not really see anything that is actually open anywhere at this point. With that being said I am a little reluctant to completely just healing out and discharge him based on the dorsal foot wound being closed even though I do not see anything open we have been there down this road before where things would reopen causing additional problems. Nonetheless I think that we need to monitor him a little bit more closely to make sure nothing worsens again. 11/30/2020 upon evaluation today patient actually appears to be doing quite well in regard to his wound at this point. He has been tolerating the dressing changes without complication which is great news and overall I am extremely pleased with where things stand today. No fevers, chills, nausea, vomiting, or diarrhea. Readmission: 04/24/2021 upon evaluation today patient presents for reevaluation here in the clinic this is a patient that I  have previously taken care of when he had surgical wounds that had to be healed and this was several months back he in fact healed I believe in May 2022. Subsequently he has an issue now with his foot as he has been walking around more and this has caused an abnormal pressure location. This again is on the foot where he had all the surgery previous. That is on the right plantar foot. With that being said unfortunately the pati having a significant issue here with pressure ent does seem to be which I think is not can be the easiest thing to manage due to the shape of his foot. He is supposed to be getting new diabetic shoes fitted but right now he still has the ones currently that he has been you been using previously. Otherwise none of his major medical problems have shifted at this point. 10/11; second visit for this man who has a area right plantar foot at roughly the fourth metatarsal head. We have been using Hydrofera Blue and a surgical shoe. 05/08/2021 upon evaluation today patient's wound is actually showing signs of being a little bit larger he has a lot of callus around the edges of the wound that is can have to be cleaned away this is some undermining region. Nonetheless I think if we get that cleared away we can actually go and see about put him in the total contact cast with Dr. Dellia Nims discussed with him last week. Objective Constitutional Well-nourished and well-hydrated in no acute distress. Vitals Time Taken: 1:04 PM, Height: 72 in, Weight: 219 lbs, BMI: 29.7, Temperature: 98.4 F, Pulse: 86 bpm, Respiratory Rate: 16 breaths/min, Blood Pressure: 138/93 mmHg. Respiratory normal breathing without difficulty. Psychiatric this patient is able to make decisions and demonstrates good insight into disease process. Alert and Oriented x 3. pleasant and cooperative. General Notes: Patient's wound bed did require sharp debridement  to clear away some of the necrotic debris from the surface  of the wound as well as undermining and callus around the edges of the wound. Post debridement we did take a picture as well and the good news is the patient's wound seems to be doing better I do believe the cast is getting help with preventing any significant pressure and allow this to heal much more effectively and quickly. Integumentary (Hair, Skin) Wound #10 status is Open. Original cause of wound was Blister. The date acquired was: 04/10/2021. The wound has been in treatment 2 weeks. The wound is located on the [place]. The wound measures 1cm length x 1.2cm width x 0.5cm depth; 0.942cm^2 area and 0.471cm^3 volume. There is Fat Layer (Subcutaneous Tissue) exposed. There is no tunneling noted, however, there is undermining starting at 7:00 and ending at 4:00 with a maximum distance of 0.4cm. There is a medium amount of serosanguineous drainage noted. The wound margin is thickened. There is large (67-100%) red, pink granulation within the wound bed. There is a small (1-33%) amount of necrotic tissue within the wound bed including Adherent Slough. Assessment Ralph Peterson, Ralph Peterson ([id number]) Active Problems ICD-10 Type 2 diabetes mellitus with foot ulcer Non-pressure chronic ulcer of other part of right foot with fat layer exposed Paroxysmal atrial fibrillation Long term (current) use of anticoagulants Chronic combined systolic (congestive) and diastolic (congestive) heart failure End stage renal disease Dependence on renal dialysis Procedures Wound #10 Pre-procedure diagnosis of Wound #10 is a Diabetic Wound/Ulcer of the Lower Extremity located on the Right,Distal,Plantar Foot .Severity of Tissue Pre Debridement is: Fat layer exposed. There was a Excisional Skin/Subcutaneous Tissue Debridement with a total area of 3.3 sq cm performed by Tommie Sams., PA-C. With the following instrument(s): Curette to remove Viable and Non-Viable tissue/material. Material  removed includes Callus, Subcutaneous Tissue, and Slough. No specimens were taken. A time out was conducted at 13:21, prior to the start of the procedure. A Minimum amount of bleeding was controlled with Pressure. The procedure was tolerated well. Post Debridement Measurements: 1.5cm length x 2.2cm width x 0.6cm depth; 1.555cm^3 volume. Character of Wound/Ulcer Post Debridement is stable. Severity of Tissue Post Debridement is: Fat layer exposed. Post procedure Diagnosis Wound #10: Same as Pre-Procedure Plan 1. Would recommend currently that we going continue with wound care measures as before and the patient is in agreement with that plan this includes the use of the [facility] which I think is actually doing a pretty good job here. 2. Also can recommend that we actually initiate treatment with a total contact cast that was applied by myself today as well. 3. I am also going to recommend the patient should continue to monitor for any signs of worsening or infection if he develops any pain or issues he should let me know soon as possible. We will see patient back for reevaluation in 1 week here in the clinic. If anything worsens or changes patient will contact our office for additional recommendations. Electronic Signature(s) Signed: 05/08/2021 1:29:45 PM By: Worthy Keeler PA-C Entered By: Worthy Keeler on 05/08/2021 13:29:45 Ralph Peterson ([id number]) -------------------------------------------------------------------------------- SuperBill Details Patient Name: Ralph Peterson Date of Service: 05/08/2021 Medical Record Number: [id number] Patient Account Number: [id number] Date of Birth/Sex: [birth year] (55 y.o. M) Treating RN: Cornell Barman Primary Care Provider: Vidal Schwalbe Other Clinician: Referring Provider: Vidal Schwalbe Treating Provider/Extender: Skipper Cliche in Treatment: 2 Diagnosis Coding ICD-10 Codes Code Description [id number] Type 2 diabetes  mellitus with  foot ulcer 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d I48.0 Paroxysmal atrial fibrillation Z79.01 Long term (current) use of anticoagulants I50.42 Chronic combined systolic (congestive) and diastolic (congestive) heart failure N18.6 End stage renal disease Z99.2 Dependence on renal dialysis Facility Procedures CPT4 Code: [id number] Description: [id number] - DEB SUBQ TISSUE 20 SQ CM/< Modifier: Quantity: 1 CPT4 Code: Description: ICD-10 Diagnosis Description L97.512 Non-pressure chronic ulcer of other part of right foot with fat layer ex Modifier: posed Quantity: Physician Procedures CPT4 [id number] Description: 11042 - WC PHYS SUBQ TISS 20 SQ CM Modifier: Quantity: 1 CPT4 Code: Description: ICD-10 Diagnosis Description L97.512 Non-pressure chronic ulcer of other part of right foot with fat layer ex Modifier: posed Quantity: Electronic Signature(s) Signed: 05/08/2021 1:30:01 PM By: Worthy Keeler PA-C Entered By: Worthy Keeler on 05/08/2021 13:30:01

## 2021-05-09 ENCOUNTER — Other Ambulatory Visit (INDEPENDENT_AMBULATORY_CARE_PROVIDER_SITE_OTHER): Payer: Self-pay | Admitting: Vascular Surgery

## 2021-05-09 DIAGNOSIS — T829XXD Unspecified complication of cardiac and vascular prosthetic device, implant and graft, subsequent encounter: Secondary | ICD-10-CM

## 2021-05-10 ENCOUNTER — Encounter (INDEPENDENT_AMBULATORY_CARE_PROVIDER_SITE_OTHER): Payer: Self-pay | Admitting: Nurse Practitioner

## 2021-05-10 ENCOUNTER — Ambulatory Visit (INDEPENDENT_AMBULATORY_CARE_PROVIDER_SITE_OTHER): Payer: Medicare PPO

## 2021-05-10 ENCOUNTER — Telehealth (INDEPENDENT_AMBULATORY_CARE_PROVIDER_SITE_OTHER): Payer: Self-pay

## 2021-05-10 ENCOUNTER — Encounter: Payer: Medicare PPO | Admitting: Physician Assistant

## 2021-05-10 ENCOUNTER — Other Ambulatory Visit: Payer: Self-pay

## 2021-05-10 ENCOUNTER — Ambulatory Visit (INDEPENDENT_AMBULATORY_CARE_PROVIDER_SITE_OTHER): Payer: Medicare PPO | Admitting: Nurse Practitioner

## 2021-05-10 VITALS — BP 136/85 | HR 88 | Resp 16 | Wt 232.0 lb

## 2021-05-10 DIAGNOSIS — I10 Essential (primary) hypertension: Secondary | ICD-10-CM

## 2021-05-10 DIAGNOSIS — T829XXD Unspecified complication of cardiac and vascular prosthetic device, implant and graft, subsequent encounter: Secondary | ICD-10-CM | POA: Diagnosis not present

## 2021-05-10 DIAGNOSIS — E1122 Type 2 diabetes mellitus with diabetic chronic kidney disease: Secondary | ICD-10-CM

## 2021-05-10 DIAGNOSIS — N186 End stage renal disease: Secondary | ICD-10-CM

## 2021-05-10 DIAGNOSIS — Z992 Dependence on renal dialysis: Secondary | ICD-10-CM

## 2021-05-10 DIAGNOSIS — E11621 Type 2 diabetes mellitus with foot ulcer: Secondary | ICD-10-CM | POA: Diagnosis not present

## 2021-05-10 NOTE — Progress Notes (Signed)
Subjective:    Patient ID: Ralph Peterson, male    DOB: May 21, 1966, 55 y.o.   MRN: [id number] Chief Complaint  Patient presents with   Follow-up    Ultrasound follow up    Ralph Peterson is a 55 year old male that returns to the office for followup of their dialysis access. The function of the access has been stable. The patient denies increased bleeding time or increased recirculation. Patient denies difficulty with cannulation. The patient denies hand pain or other symptoms consistent with steal phenomena.  No significant arm swelling.  The patient denies redness or swelling at the access site. The patient denies fever or chills at home or while on dialysis.  The patient denies amaurosis fugax or recent TIA symptoms. There are no recent neurological changes noted. The patient denies claudication symptoms or rest pain symptoms. The patient denies history of DVT, PE or superficial thrombophlebitis. The patient denies recent episodes of angina or shortness of breath.   Today noninvasive studies show a flow volume of 97.  There is evidence of venous stenosis as well as in the mid forearm.  The bruit has a whistling noise in the midportion       Review of Systems  Musculoskeletal:  Positive for gait problem.  Hematological:  Does not bruise/bleed easily.  All other systems reviewed and are negative.     Objective:   Physical Exam Vitals reviewed.  HENT:     Head: Normocephalic.  Cardiovascular:     Rate and Rhythm: Normal rate.     Pulses:          Radial pulses are 2+ on the right side.     Arteriovenous access: Left arteriovenous access is present.    Comments: Good thrill but whistling in midportion Pulmonary:     Effort: Pulmonary effort is normal.  Skin:    General: Skin is warm and dry.  Neurological:     Mental Status: Ralph Peterson is alert and oriented to person, place, and time.  Psychiatric:        Mood and Affect: Mood normal.        Behavior: Behavior  normal.        Thought Content: Thought content normal.        Judgment: Judgment normal.    BP 136/85 (BP Location: Left Arm)   Pulse 88   Resp 16   Wt 232 lb (105.2 kg)   BMI 31.46 kg/m   Past Medical History:  Diagnosis Date   Anxiety    Arrhythmia    atrial fibrillation   Atrial fibrillation (HCC)    CAD (coronary artery disease)    a. 09/2010 Cath/PCI ([facility]): LM nl, LAD 66m D1 80 (small), LCX 364mOM1 30, RI 70 (small), RCA 70 (DES).   CHF (congestive heart failure) (HCC)    Coronary artery disease    COVID-19 virus infection 07/2019   Diabetes mellitus without complication (HCC)    ESRD (end stage renal disease) ([facility]   Hyperlipidemia    Hypertension    Ischemic cardiomyopathy    a.  12/2011 Echo ([facility]): NL EF, mod LVH. Mild AS/MS, triv PR/TR. 07/2019 Echo: EF 25-30%, GR1 DD, inf/post HK, low nl RV fxn, mildly dil RA, triv MR, mild Ao sclerosis w/o stenosis.   NSTEMI (non-ST elevated myocardial infarction) ([facility]   PAD (peripheral artery disease) ([facility]   a. 03/2016 s/p PTA/DEB R SFA/popliteal/peroneal; b. 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 08/2017 Atherectomy/DEB dist  L SFA/popliteal. PTA of L AT; d. 06/2018 PTA/DEB L SFA/popliteal/PT/AT; e. 01/2019 Stable ABIs.   Sleep apnea     Social History   Socioeconomic History   Marital status: Single    Spouse name: Not on file   Number of children: Not on file   Years of education: Not on file   Highest education level: Not on file  Occupational History   Not on file  Tobacco Use   Smoking status: Former    Types: Cigarettes    Quit date: 2010    Years since quitting: 12.8   Smokeless tobacco: Never  Vaping Use   Vaping Use: Never used  Substance and Sexual Activity   Alcohol use: Not Currently   Drug use: No   Sexual activity: Not on file  Other Topics Concern   Not on file  Social History Narrative   Not on file   Social Determinants of Health   Financial Resource Strain: Not on file  Food  Insecurity: Not on file  Transportation Needs: Not on file  Physical Activity: Not on file  Stress: Not on file  Social Connections: Not on file  Intimate Partner Violence: Not on file    Past Surgical History:  Procedure Laterality Date   ABDOMINAL AORTOGRAM N/A 09/10/2017   Procedure: ABDOMINAL AORTOGRAM;  Surgeon: Wellington Hampshire, MD;  Location: [place] [facility];  Service: Cardiovascular;  Laterality: N/A;   AMPUTATION TOE Bilateral    one toe on right, all five toes on the left   CARDIAC CATHETERIZATION  2012   [facility]; X1 stent 2.5 x 33 mm xience stent Distal TCA   I & D EXTREMITY Right 08/25/2020   Procedure: IRRIGATION AND DEBRIDEMENT OF RIGHT FOOT;  Surgeon: Felipa Furnace, DPM;  Location: [place];  Service: Podiatry;  Laterality: Right;   LOWER EXTREMITY ANGIOGRAPHY Bilateral 09/10/2017   Procedure: Lower Extremity Angiography;  Surgeon: Wellington Hampshire, MD;  Location: [place] [facility];  Service: Cardiovascular;  Laterality: Bilateral;   LOWER EXTREMITY ANGIOGRAPHY Left 06/23/2018   Procedure: LOWER EXTREMITY ANGIOGRAPHY;  Surgeon: Katha Cabal, MD;  Location: [place] [facility];  Service: Cardiovascular;  Laterality: Left;   LOWER EXTREMITY ANGIOGRAPHY Right 02/01/2020   Procedure: LOWER EXTREMITY ANGIOGRAPHY;  Surgeon: Katha Cabal, MD;  Location: [place] [facility];  Service: Cardiovascular;  Laterality: Right;   LOWER EXTREMITY ANGIOGRAPHY Right 08/15/2020   Procedure: LOWER EXTREMITY ANGIOGRAPHY;  Surgeon: Katha Cabal, MD;  Location: [place] [facility];  Service: Cardiovascular;  Laterality: Right;   PERIPHERAL VASCULAR ATHERECTOMY Left 09/10/2017   Procedure: PERIPHERAL VASCULAR ATHERECTOMY;  Surgeon: Wellington Hampshire, MD;  Location: [place] [facility];  Service: Cardiovascular;  Laterality: Left;  SFA/POPLITEAL   PERIPHERAL VASCULAR BALLOON ANGIOPLASTY Left 09/10/2017   Procedure: PERIPHERAL VASCULAR BALLOON ANGIOPLASTY;  Surgeon:  Wellington Hampshire, MD;  Location: [place] [facility];  Service: Cardiovascular;  Laterality: Left;  ANTERIAL TIBIAL   PERIPHERAL VASCULAR CATHETERIZATION Right 03/26/2016   Procedure: Lower Extremity Angiography;  Surgeon: Katha Cabal, MD;  Location: [place] [facility];  Service: Cardiovascular;  Laterality: Right;   PERIPHERAL VASCULAR INTERVENTION Left 09/10/2017   Procedure: PERIPHERAL VASCULAR INTERVENTION;  Surgeon: Wellington Hampshire, MD;  Location: [place] [facility];  Service: Cardiovascular;  Laterality: Left;  SFA/POPLITEAL    Family History  Problem Relation Age of Onset   Hypertension Mother     Allergies  Allergen Reactions  Oxycodone-Acetaminophen Itching   Gabapentin Other (See Comments)    unknown   Tramadol Other (See Comments)    unknown   Penicillins Swelling and Rash    Patient endorses itching/hives and tongue swelling with penicillin and amoxicillin at some point in the past.     CBC Latest Ref Rng & Units 08/28/2020 08/27/2020 08/26/2020  WBC 4.0 - 10.5 K/uL 5.7 4.6 6.2  Hemoglobin 13.0 - 17.0 g/dL 11.3(L) 11.2(L) 11.2(L)  Hematocrit 39.0 - 52.0 % 34.9(L) 33.8(L) 33.8(L)  Platelets 150 - 400 K/uL 114(L) 110(L) 121(L)      CMP     Component Value Date/Time   NA 128 (L) 08/28/2020 0345   NA 139 03/19/2016 1423   K 6.1 (H) 08/28/2020 0345   CL 92 (L) 08/28/2020 0345   CO2 22 08/28/2020 0345   GLUCOSE 103 (H) 08/28/2020 0345   BUN 51 (H) 08/28/2020 0345   BUN 33 (H) 03/19/2016 1423   CREATININE 7.91 (H) 08/28/2020 0345   CALCIUM 8.6 (L) 08/28/2020 0345   PROT 6.9 08/24/2020 1500   PROT 6.8 03/19/2016 1423   ALBUMIN 2.7 (L) 08/28/2020 0345   ALBUMIN 4.1 03/19/2016 1423   AST 20 08/24/2020 1500   ALT 13 08/24/2020 1500   ALKPHOS 109 08/24/2020 1500   BILITOT 1.0 08/24/2020 1500   BILITOT 0.4 03/19/2016 1423   GFRNONAA 8 (L) 08/28/2020 0345   GFRAA 10 (L) 03/10/2020 0440     No results found.     Assessment & Plan:   1. ESRD (end stage  renal disease) ([place]) Recommend:  The patient is experiencing increasing problems with their dialysis access.  Patient should have a fistulagram with the intention for intervention.  The intention for intervention is to restore appropriate flow and prevent thrombosis and possible loss of the access.  As well as improve the quality of dialysis therapy.  The risks, benefits and alternative therapies were reviewed in detail with the patient.  All questions were answered.  The patient agrees to proceed with angio/intervention.      2. Primary hypertension Continue antihypertensive medications as already ordered, these medications have been reviewed and there are no changes at this time.   3. Type 2 diabetes mellitus with chronic kidney disease on chronic dialysis, without long-term current use of insulin (HCC) Continue hypoglycemic medications as already ordered, these medications have been reviewed and there are no changes at this time.  Hgb A1C to be monitored as already arranged by primary service    Current Outpatient Medications on File Prior to Visit  Medication Sig Dispense Refill   apixaban (ELIQUIS) 5 MG TABS tablet Take 1 tablet (5 mg total) by mouth 2 (two) times daily. 60 tablet 3   atorvastatin (LIPITOR) 80 MG tablet Take by mouth.     cinacalcet (SENSIPAR) 60 MG tablet Take 60 mg by mouth at bedtime.     Doxercalciferol (HECTOROL IV) Doxercalciferol (Hectorol)     escitalopram (LEXAPRO) 10 MG tablet Take 10 mg by mouth at bedtime.     gentamicin cream (GARAMYCIN) 0.1 % Apply 1 application topically 2 (two) times daily. 30 g 1   glipiZIDE (GLUCOTROL) 5 MG tablet Take 5 mg by mouth daily.     loperamide (IMODIUM) 2 MG capsule Take 2 mg by mouth daily as needed for diarrhea or loose stools.     metoprolol succinate (TOPROL-XL) 50 MG 24 hr tablet Take 50 mg by mouth 2 (two) times daily.     montelukast (SINGULAIR)  10 MG tablet Take 10 mg by mouth at bedtime.     omeprazole  (PRILOSEC) 20 MG capsule Take 20 mg by mouth at bedtime.     sevelamer carbonate (RENVELA) 800 MG tablet Take 1,600-3,200 mg by mouth See admin instructions. Take 4 tablets ('3200mg'$ ) three times daily with meals and take 2 tablet (1600 mg) by mouth with snacks     valsartan (DIOVAN) 40 MG tablet Take by mouth.     ciprofloxacin (CIPRO) 500 MG tablet Take 1 tablet (500 mg total) by mouth 2 (two) times daily. (Patient not taking: No sig reported) 20 tablet 0   doxycycline (VIBRA-TABS) 100 MG tablet Take 1 tablet (100 mg total) by mouth 2 (two) times daily. (Patient not taking: Reported on 05/10/2021) 20 tablet 0   insulin detemir (LEVEMIR) 100 UNIT/ML injection Inject 0.05 mLs (5 Units total) into the skin daily. (Patient taking differently: Inject 5 Units into the skin at bedtime.)     lanthanum (FOSRENOL) 1000 MG chewable tablet  (Patient not taking: Reported on 05/10/2021)     No current facility-administered medications on file prior to visit.    There are no Patient Instructions on file for this visit. No follow-ups on file.   Kris Hartmann, NP

## 2021-05-10 NOTE — Progress Notes (Signed)
ADEEL, LOUD ([id number]) Visit Report for 05/10/2021 Arrival Information Details Patient Name: Ralph Peterson, Ralph Peterson. Date of Service: 05/10/2021 9:45 AM Medical Record Number: [id number] Patient Account Number: [id number] Date of Birth/Sex: 1966/02/24 (55 y.o. M) Treating RN: Cornell Barman Primary Care Anina Schnake: Vidal Schwalbe Other Clinician: Referring Aniza Shor: Vidal Schwalbe Treating Kenyah Luba/Extender: Skipper Cliche in Treatment: 2 Visit Information History Since Last Visit Added or deleted any medications: No Patient Arrived: Cane Has Dressing in Place as Prescribed: Yes Arrival Time: 09:43 Has Footwear/Offloading in Place as Prescribed: Yes Accompanied By: self Right: Total Contact Cast Transfer Assistance: None Pain Present Now: No Patient Identification Verified: Yes Secondary Verification Process Completed: Yes Patient Has Alerts: Yes Patient Alerts: Patient on Blood Thinner Eliquis Diabetic AVVS 07/2020 [facility] Electronic Signature(s) Signed: 05/10/2021 2:59:00 PM By: Gretta Cool, BSN, RN, CWS, Kim RN, BSN Entered By: Gretta Cool, BSN, RN, CWS, Kim on 05/10/2021 09:51:25 Ralph Peterson ([id number]) -------------------------------------------------------------------------------- Encounter Discharge Information Details Patient Name: Ralph Peterson. Date of Service: 05/10/2021 9:45 AM Medical Record Number: [id number] Patient Account Number: [id number] Date of Birth/Sex: 10-May-1966 (55 y.o. M) Treating RN: Cornell Barman Primary Care Mazy Culton: Vidal Schwalbe Other Clinician: Referring Surya Folden: Vidal Schwalbe Treating Arhaan Chesnut/Extender: Skipper Cliche in Treatment: 2 Encounter Discharge Information Items Discharge Condition: Stable Ambulatory Status: Ambulatory Discharge Destination: Home Transportation: Private Auto Accompanied By: self Schedule Follow-up Appointment: Yes Clinical Summary of Care: Electronic Signature(s) Signed: 05/10/2021  2:59:00 PM By: Gretta Cool, BSN, RN, CWS, Kim RN, BSN Entered By: Gretta Cool, BSN, RN, CWS, Kim on 05/10/2021 10:19:51 Ralph Peterson ([id number]) -------------------------------------------------------------------------------- Lower Extremity Assessment Details Patient Name: Ralph Peterson Date of Service: 05/10/2021 9:45 AM Medical Record Number: [id number] Patient Account Number: [id number] Date of Birth/Sex: 1965-12-17 (55 y.o. M) Treating RN: Cornell Barman Primary Care Dea Bitting: Vidal Schwalbe Other Clinician: Referring Keshan Reha: Vidal Schwalbe Treating Ajahnae Rathgeber/Extender: Skipper Cliche in Treatment: 2 Edema Assessment Assessed: [Left: No] [Right: Yes] [Left: Edema] [Right: :] Calf Left: Right: Point of Measurement: 30 cm From Medial Instep 34 cm Ankle Left: Right: Point of Measurement: 9 cm From Medial Instep 22 cm Vascular Assessment Pulses: Dorsalis Pedis Palpable: [Right:Yes] Electronic Signature(s) Signed: 05/10/2021 2:59:00 PM By: Gretta Cool, BSN, RN, CWS, Kim RN, BSN Entered By: Gretta Cool, BSN, RN, CWS, Kim on 05/10/2021 10:03:26 Ralph Peterson ([id number]) -------------------------------------------------------------------------------- Multi Wound Chart Details Patient Name: Ralph Peterson. Date of Service: 05/10/2021 9:45 AM Medical Record Number: [id number] Patient Account Number: [id number] Date of Birth/Sex: 17-Oct-1965 (55 y.o. M) Treating RN: Cornell Barman Primary Care Yaretsi Humphres: Vidal Schwalbe Other Clinician: Referring Ahlijah Raia: Vidal Schwalbe Treating Alexyia Guarino/Extender: Skipper Cliche in Treatment: 2 Vital Signs Height(in): 72 Pulse(bpm): 43 Weight(lbs): 219 Blood Pressure(mmHg): 134/89 Body Mass Index(BMI): 30 Temperature(F): 98.7 Respiratory Rate(breaths/min): 16 Photos: [N/A:N/A] Wound Location: Right, Distal, Plantar Foot N/A N/A Wounding Event: Blister N/A N/A Primary Etiology: Diabetic Wound/Ulcer of the Lower N/A  N/A Extremity Comorbid History: Arrhythmia, Congestive Heart N/A N/A Failure, Coronary Artery Disease, Hypertension, Myocardial Infarction, Peripheral Venous Disease, Type II Diabetes, Neuropathy Date Acquired: 04/10/2021 N/A N/A Weeks of Treatment: 2 N/A N/A Wound Status: Open N/A N/A Measurements L x W x D (cm) 1.8x2x0.5 N/A N/A Area (cm) : 2.827 N/A N/A Volume (cm) : 1.414 N/A N/A % Reduction in Area: -350.20% N/A N/A % Reduction in Volume: -1022.20% N/A N/A Classification: Grade 2 N/A N/A Exudate Amount: Medium N/A N/A Exudate Type: Serosanguineous N/A N/A Exudate Color: red, brown N/A N/A Wound Margin: Thickened N/A N/A Granulation Amount: Large (67-100%) N/A N/A  Granulation Quality: Red, Pink N/A N/A Necrotic Amount: Small (1-33%) N/A N/A Exposed Structures: Fat Layer (Subcutaneous Tissue): N/A N/A Yes Fascia: No Tendon: No Muscle: No Joint: No Bone: No Epithelialization: None N/A N/A Treatment Notes Electronic Signature(s) Signed: 05/10/2021 2:59:00 PM By: Gretta Cool, BSN, RN, CWS, Kim RN, BSN Entered By: Gretta Cool, BSN, RN, CWS, Kim on 05/10/2021 10:17:07 Ralph Peterson ([name] ([id number]) -------------------------------------------------------------------------------- [place] Details Patient Name: Ralph Peterson, Ralph Peterson. Date of Service: 05/10/2021 9:45 AM Medical Record Number: [id number] Patient Account Number: [id number] Date of Birth/Sex: [birth year] (55 y.o. M) Treating RN: Cornell Barman Primary Care Mckinley Olheiser: Vidal Schwalbe Other Clinician: Referring Rudolph Dobler: Vidal Schwalbe Treating Owen Pratte/Extender: Skipper Cliche in Treatment: 2 Active Inactive Wound/Skin Impairment Nursing Diagnoses: Impaired tissue integrity Knowledge deficit related to smoking impact on wound healing Knowledge deficit related to ulceration/compromised skin integrity Goals: Patient/caregiver will verbalize understanding of skin  care regimen Date Initiated: 04/24/2021 Target Resolution Date: 05/11/2021 Goal Status: Active Ulcer/skin breakdown will have a volume reduction of 30% by week 4 Date Initiated: 04/24/2021 Target Resolution Date: 05/24/2021 Goal Status: Active Ulcer/skin breakdown will have a volume reduction of 50% by week 8 Date Initiated: 04/24/2021 Target Resolution Date: 06/23/2021 Goal Status: Active Ulcer/skin breakdown will have a volume reduction of 80% by week 12 Date Initiated: 04/24/2021 Target Resolution Date: 07/24/2021 Goal Status: Active Ulcer/skin breakdown will heal within 14 weeks Date Initiated: 04/24/2021 Target Resolution Date: 08/05/2021 Goal Status: Active Interventions: Assess patient/caregiver ability to obtain necessary supplies Assess patient/caregiver ability to perform ulcer/skin care regimen upon admission and as needed Assess ulceration(s) every visit Notes: Electronic Signature(s) Signed: 05/10/2021 2:59:00 PM By: Gretta Cool, BSN, RN, CWS, Kim RN, BSN Entered By: Gretta Cool, BSN, RN, CWS, Kim on 05/10/2021 10:16:14 Ralph Peterson ([id number]) -------------------------------------------------------------------------------- Pain Assessment Details Patient Name: Ralph Peterson. Date of Service: 05/10/2021 9:45 AM Medical Record Number: [id number] Patient Account Number: [id number] Date of Birth/Sex: 04-Sep-1965 (55 y.o. M) Treating RN: Cornell Barman Primary Care Meredeth Furber: Vidal Schwalbe Other Clinician: Referring Shadia Larose: Vidal Schwalbe Treating Kylene Zamarron/Extender: Skipper Cliche in Treatment: 2 Active Problems Location of Pain Severity and Description of Pain Patient Has Paino No Site Locations Pain Management and Medication Current Pain Management: Notes Patient denies pain at this time. Electronic Signature(s) Signed: 05/10/2021 2:59:00 PM By: Gretta Cool, BSN, RN, CWS, Kim RN, BSN Entered By: Gretta Cool, BSN, RN, CWS, Kim on 05/10/2021 09:53:32 Ralph Peterson  ([id number]) -------------------------------------------------------------------------------- Patient/Caregiver Education Details Patient Name: Ralph Peterson Date of Service: 05/10/2021 9:45 AM Medical Record Number: [id number] Patient Account Number: [id number] Date of Birth/Gender: 1966-07-19 (55 y.o. M) Treating RN: Cornell Barman Primary Care Physician: Vidal Schwalbe Other Clinician: Referring Physician: Vidal Schwalbe Treating Physician/Extender: Skipper Cliche in Treatment: 2 Education Assessment Education Provided To: Patient Education Topics Provided Offloading: Handouts: What is Offloadingo Methods: Demonstration, Explain/Verbal Responses: State content correctly Electronic Signature(s) Signed: 05/10/2021 2:59:00 PM By: Gretta Cool, BSN, RN, CWS, Kim RN, BSN Entered By: Gretta Cool, BSN, RN, CWS, Kim on 05/10/2021 10:19:03 Ralph Peterson ([id number]) -------------------------------------------------------------------------------- Wound Assessment Details Patient Name: Ralph Peterson Date of Service: 05/10/2021 9:45 AM Medical Record Number: [id number] Patient Account Number: [id number] Date of Birth/Sex: 04-20-1966 (55 y.o. M) Treating RN: Cornell Barman Primary Care Raju Coppolino: Vidal Schwalbe Other Clinician: Referring Lucillia Corson: Vidal Schwalbe Treating Dion Sibal/Extender: Skipper Cliche in Treatment: 2 Wound Status Wound Number: 10 Primary Diabetic Wound/Ulcer of the Lower Extremity Etiology: Wound Location: Right, Distal, Plantar Foot Wound Open Wounding Event: Blister Status: Date Acquired: 04/10/2021  Comorbid Arrhythmia, Congestive Heart Failure, Coronary Artery Weeks Of Treatment: 2 History: Disease, Hypertension, Myocardial Infarction, Peripheral Clustered Wound: No Venous Disease, Type II Diabetes, Neuropathy Photos Wound Measurements Length: (cm) 1.8 % R Width: (cm) 2 % R Depth: (cm) 0.5 Epi Area: (cm) 2.827 Tu Volume: (cm) 1.414 Un eduction  in Area: -350.2% eduction in Volume: -1022.2% thelialization: None nneling: No dermining: No Wound Description Classification: Grade 2 Fou Wound Margin: Thickened Slo Exudate Amount: Medium Exudate Type: Serosanguineous Exudate Color: red, brown l Odor After Cleansing: No ugh/Fibrino Yes Wound Bed Granulation Amount: Large (67-100%) Exposed Structure Granulation Quality: Red, Pink Fascia Exposed: No Necrotic Amount: Small (1-33%) Fat Layer (Subcutaneous Tissue) Exposed: Yes Necrotic Quality: Adherent Slough Tendon Exposed: No Muscle Exposed: No Joint Exposed: No Bone Exposed: No Treatment Notes Wound #10 (Foot) Wound Laterality: Plantar, Right, Distal Cleanser Peri-Wound Care Topical Ralph Peterson, Ralph Peterson ([id number]) Primary Dressing Hydrofera Blue Ready Transfer Foam, 2.5x2.5 (in/in) Discharge Instruction: Apply Hydrofera Blue Ready to wound bed as directed Secondary Dressing Zetuvit Plus Silicone Border Dressing 5x5 (in/in) Secured With Compression Wrap Compression Stockings Environmental education officer) Signed: 05/10/2021 2:59:00 PM By: Gretta Cool, BSN, RN, CWS, Kim RN, BSN Entered By: Gretta Cool, BSN, RN, CWS, Kim on 05/10/2021 10:02:12 Ralph Peterson ([id number]) -------------------------------------------------------------------------------- [place] Details Patient Name: Ralph Peterson Date of Service: 05/10/2021 9:45 AM Medical Record Number: [id number] Patient Account Number: [id number] Date of Birth/Sex: [birth year] (55 y.o. M) Treating RN: Cornell Barman Primary Care Trevon Strothers: Vidal Schwalbe Other Clinician: Referring Lucia Harm: Vidal Schwalbe Treating Advit Trethewey/Extender: Skipper Cliche in Treatment: 2 Vital Signs Time Taken: 09:51 Temperature (F): 98.7 Height (in): 72 Pulse (bpm): 89 Weight (lbs): 219 Respiratory Rate (breaths/min): 16 Body Mass Index (BMI): 29.7 Blood Pressure (mmHg): 134/89 Reference Range: 80 - 120 mg / dl Electronic  Signature(s) Signed: 05/10/2021 2:59:00 PM By: Gretta Cool, BSN, RN, CWS, Kim RN, BSN Entered By: Gretta Cool, BSN, RN, CWS, Kim on 05/10/2021 09:53:14

## 2021-05-10 NOTE — Progress Notes (Addendum)
DEWARREN, ALAMOS ([id number]) Visit Report for 05/10/2021 Chief Complaint Document Details Patient Name: Ralph Peterson, Ralph Peterson. Date of Service: 05/10/2021 9:45 AM Medical Record Number: [id number] Patient Account Number: [id number] Date of Birth/Sex: 12-Oct-1965 (55 y.o. 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 M) Treating RN: Cornell Barman Primary Care Provider: Vidal Schwalbe Other Clinician: Referring Provider: Vidal Schwalbe Treating Provider/Extender: Skipper Cliche in Treatment: 2 History of Present Illness HPI Description: 08/17/2020 upon evaluation today patient actually appears to be doing decently well today which is good news in regard to the dorsal wound which is almost healed and the lateral wound also doing much better. With regard to the plantar wound this is still draining purulent drainage unfortunately the culture did reveal that he had Pseudomonas as well as Enterococcus. With that being said he is allergic to penicillin which took away the only oral medication for the Enterococcus I did look at linezolid as a possibility but unfortunately there were medication interactions. Also the same was true the Cipro where there were medication interactions. Nonetheless I  want to see if we can get him into infectious disease to see if they can work with him to try to find possibly add at home IV antibiotic regimen that can help out at this point. No sharp debridement is can be necessary today. 09/07/2020 upon evaluation today patient appears to be doing unfortunately not too well in regard to his wound. He has been tolerating the dressing changes without complication. Fortunately there is no sign of active infection at this time. No fevers, chills, nausea, vomiting, or diarrhea. The patient did have a repeat surgery going to clean out the abscess and try to work on getting things moving in a better direction about a week and a half ago. He still has sutures in place. With that being said unfortunately he does not appear to be showing signs of a lot of improvement in my opinion. He does have sutures on the plantar aspect still in place and on the dorsal aspect he has a small opening which actually probes all the way down through and actually came out of the plantar aspect. I feel like that he really is having a lot of issues here and I am afraid that if we do not get this taken care of this is can end up with him having a much larger and more extensive amputation than what he was hoping to have to deal with with this. It has already been since June 2021 but has been dealing with this including all the time since the amputation. 09/14/2020 upon evaluation today patient appears to be doing really about the same in regard to the wound on the dorsal surface of his foot. The plantar foot unfortunately still has sutures in place I really cannot see how things are doing in that regard. With the dorsal foot however this appears to show signs of still having a small opening in the distal aspect which does actually probe down to  bone there is also significant purulent drainage we did actually obtain a wound culture today to further evaluate the issue here. 09/21/2020 upon evaluation  today patient appears to be doing about the same in regard to his wound on the dorsal surface of his foot and the plantar foot he still does not have the sutures out at this point. That should hopefully happen in the next week. With that being said the main issue is he continues to have drainage here. It was noted that he had positive findings on culture for Enterobacter. With that being said he cannot take oral penicillin he could do vancomycin and I think that is good to be the ideal thing this to see if we can get him started on IV vancomycin at dialysis. We will get a get in touch with them to try to see if we can coordinate that being done there at the dialysis center. I think that is the ideal situation at this point I would probably start with just 2 weeks of therapy. 09/28/2020 upon evaluation today patient appears to be doing well with regard to his wounds in general. He also has back on the vancomycin after the culture results and this seems to be doing excellent for him. All the wounds are showing signs of good improvement. No fevers, chills, nausea, vomiting, or diarrhea. 10/05/2020 upon evaluation today patient appears to actually be doing excellent in regard to his foot. Fortunately there is a lot of signs of new granulation epithelization at this point. There does not appear to be any evidence of active infection which is great news and overall very pleased with where things stand. In fact the open wound along the plantar aspect of his foot as well as the dorsal part of his foot has dramatically improved since we went forward with the vancomycin and requested that the dialysis center provide this to him by way of IV antibiotics with his dialysis. Overall I think that has made a dramatic improvement and overall his wound seems to be doing significantly better at this point. No fevers, chills, nausea, vomiting, or diarrhea. The patient was set to have his second opinion appointment with  podiatry [facility] next week but to be perfectly honest I am not even certain that that is necessary at this point I do not think there is much that they would be able to do for him nor that he really would want them to do considering how well things look at this time. 10/12/2020 on evaluation today patient appears to be doing excellent in regard to his wounds. In fact the only issue I see is that everything is dried out as far as the collagen is concerned. I do think we can try to keep this moist he will continue to make excellent progress as he has been. Overall I am extremely pleased with what I see today. 10/19/2020 upon evaluation today patient appears to be doing decently well in regard to the wounds on his foot he is can require some debridement in this location. Unfortunately is having some issues with blistering over the leg which is of greater concern at this point. Fortunately I do not see any signs of active infection which is great news. Nonetheless the blistering may just be related to venous insufficiency or subsequently could potentially be an issue with overall worsening with regard to an infection but again it is not really hot to touch. 10/26/2020 upon evaluation today patient appears to be doing well with regard  to the majority of his wounds. Unfortunately the dorsal surface of his foot does not appear to be doing quite as well today compared to what it has been doing. I do think that we will need to address that currently. Everything else is showing signs of improvement. He did not get the prescription that I gave him previous as unfortunately the transmission failed. I did not know that until today when the patient mention to me that he never got the antibiotic. 11/02/20 upon evaluation today patient appears to be doing much better in regard to his wounds in general. I feel like that he is making good progress and even the top of the foot does not appear to be doing too poorly at this time.  He did finally get the antibiotics which is good. Unfortunately it does not look like I am able to electronically sent to his pharmacy it kept being an error in transmission every time this was sent. We finally decided to call it in. Nonetheless overall I think he is headed in the right direction based on what I am seeing. 11/09/2020 upon evaluation today patient appears to be doing well in general in regard to his wounds. I think that he is making some progress here. With that being said there is definitely still some areas of concern. Specifically the dorsal aspect of his foot distally and the lateral aspect of his foot proximally. Both of these areas are still open everything else appears to likely be closed today based on what I am seeing. 4/28; the patient really looks quite good. The area over the base of the right fifth metatarsal and the base of the fifth MTP both look healed to me. 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 They are using Hydrofera Blue rope but I think this is far too small wound now for this 11/23/2020 upon evaluation today patient actually appears to be doing excellent. In fact I do not really see anything that is actually open anywhere at this point. With that being said I am a little reluctant to completely just healing out and discharge him based on the dorsal foot wound being closed even though I do not see anything open we have been there down this road before where things would reopen causing additional problems. Nonetheless I think that we need to monitor him a little bit more closely to make sure nothing worsens again. 11/30/2020 upon evaluation today patient actually appears to be doing quite well in regard to his wound at this point. He has been tolerating the dressing changes without complication which is great news and overall I am extremely pleased  with where things stand today. No fevers, chills, nausea, vomiting, or diarrhea. Readmission: 04/24/2021 upon evaluation today patient presents for reevaluation here in the clinic this is a patient that I have previously taken care of when he had surgical wounds that had to be healed and this was several months back he in fact healed I believe in May 2022. Subsequently he has an issue now with his foot as he has been walking around more and this has caused an abnormal pressure location. This again is on the foot where he had all the surgery previous. That is on the right plantar foot. With that being said unfortunately the pati having a significant issue here with pressure ent does seem to be which I think  is not can be the easiest thing to manage due to the shape of his foot. He is supposed to be getting new diabetic shoes fitted but right now he still has the ones currently that he has been you been using previously. Otherwise none of his major medical problems have shifted at this point. 10/11; second visit for this man who has a area right plantar foot at roughly the fourth metatarsal head. We have been using Hydrofera Blue and a surgical shoe. 05/08/2021 upon evaluation today patient's wound is actually showing signs of being a little bit larger he has a lot of callus around the edges of the wound that is can have to be cleaned away this is some undermining region. Nonetheless I think if we get that cleared away we can actually go and see about put him in the total contact cast with Dr. Dellia Nims discussed with him last week. 05/10/2021 upon evaluation today patient's wound actually appears to be doing great in fact and just the 2 days since we put the cast on I think he has made even some good progress here. Nonetheless he is here for the obligatory first cast change 2 days after application. No sharp debridement necessary today. Electronic Signature(s) Signed: 05/10/2021 5:40:41 PM By: Worthy Keeler PA-C Entered By: Worthy Keeler on 05/10/2021 17:40:41 [name] ([id number]) -------------------------------------------------------------------------------- Physical Exam Details Patient Name: [name] Date of Service: 05/10/2021 9:45 AM Medical Record Number: [id number] Patient Account Number: [id number] Date of Birth/Sex: 03-09-1966 (55 y.o. M) Treating RN: Cornell Barman Primary Care Provider: Vidal Schwalbe Other Clinician: Referring Provider: Vidal Schwalbe Treating Provider/Extender: Skipper Cliche in Treatment: 2 Constitutional Well-nourished and well-hydrated in no acute distress. Respiratory normal breathing without difficulty. Psychiatric this patient is able to make decisions and demonstrates good insight into disease process. Alert and Oriented x 3. pleasant and cooperative. Notes Patient's wound again showed signs of doing well we did actually go ahead and reapply the total contact cast today which is I think perfect for him. Nonetheless I do believe that hopefully he will be able to continue to show signs of improvement as we proceed with the cast nothing seem to be a rubbing abnormally Electronic Signature(s) Signed: 05/10/2021 5:41:04 PM By: Worthy Keeler PA-C Entered By: Worthy Keeler on 05/10/2021 17:41:03 [name] ([id number]) -------------------------------------------------------------------------------- Physician Orders Details Patient Name: [name]. Date of Service: 05/10/2021 9:45 AM Medical Record Number: [id number] Patient Account Number: [id number] Date of Birth/Sex: August 08, 1965 (55 y.o. M) Treating RN: Cornell Barman Primary Care Provider: Vidal Schwalbe Other Clinician: Referring Provider: Vidal Schwalbe Treating Provider/Extender: Skipper Cliche in Treatment: 2 Verbal / Phone Orders: No Diagnosis Coding ICD-10 Coding Code Description E11.621 Type 2 diabetes mellitus with foot  ulcer 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d I48.0 Paroxysmal atrial fibrillation Z79.01 Long term (current) use of anticoagulants I50.42 Chronic combined systolic (congestive) and diastolic (congestive) heart failure 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 o Return Appointment in: - Thursday 05/10/2021 for cast change Bathing/ Shower/ Hygiene o May shower with wound dressing protected with water repellent cover or cast protector. - Do not get cast wet. If it gets wet call us immediately. If on weekend, go to urgent care or Emergency department for removal. o No tub bath. Off-Loading o Total Contact Cast to Right Lower Extremity o Other: - offload insert  from [facility]ound [facility] Wound #10 - Foot Wound Laterality: Plantar, Right, Distal Primary Dressing: Hydrofera Blue Ready Transfer Foam, 2.5x2.5 (in/in) (Generic) Every Other Day/15 Days Discharge Instructions: Apply Hydrofera Blue Ready to wound bed as directed Secondary Dressing: Zetuvit Plus Silicone Border Dressing 5x5 (in/in) Every Other Day/15 Days Electronic Signature(s) Signed: 05/10/2021 2:59:00 PM By: Gretta Cool, BSN, RN, CWS, Kim RN, BSN Signed: 05/11/2021 4:28:58 PM By: Worthy Keeler PA-C Entered By: Gretta Cool, BSN, RN, CWS, Kim on 05/10/2021 10:18:24 [name] ([id number]) -------------------------------------------------------------------------------- Problem List Details Patient Name: [name]. Date of Service: 05/10/2021 9:45 AM Medical Record Number: [id number] Patient Account Number: [id number] Date of Birth/Sex: 1966-04-25 (55 y.o. M) Treating RN: Cornell Barman Primary Care Provider: Vidal Schwalbe Other Clinician: Referring Provider: Vidal Schwalbe Treating Provider/Extender: Skipper Cliche in Treatment: 2 Active Problems ICD-10 Encounter Code  Description Active Date MDM Diagnosis E11.621 Type 2 diabetes mellitus with foot ulcer 04/24/2021 No Yes L97.512 Non-pressure chronic ulcer of other part of right foot with fat layer 04/24/2021 No Yes exposed I48.0 Paroxysmal atrial fibrillation 04/24/2021 No Yes Z79.01 Long term (current) use of anticoagulants 04/24/2021 No Yes I50.42 Chronic combined systolic (congestive) and diastolic (congestive) heart 04/24/2021 No Yes failure N18.6 End stage renal disease 04/24/2021 No Yes Z99.2 Dependence on renal dialysis 04/24/2021 No Yes Inactive Problems Resolved Problems Electronic Signature(s) Signed: 05/10/2021 10:10:13 AM By: Worthy Keeler PA-C Entered By: Worthy Keeler on 05/10/2021 10:10:12 [name] ([id number]) -------------------------------------------------------------------------------- Progress Note Details Patient Name: [name]. Date of Service: 05/10/2021 9:45 AM Medical Record Number: [id number] Patient Account Number: [id number] Date of Birth/Sex: 30-Mar-1966 (55 y.o. 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 With regard to the plantar wound this is still draining purulent drainage unfortunately the culture did reveal that he had Pseudomonas as well as Enterococcus. With that being said he is allergic to penicillin which took away the only oral medication for the Enterococcus I did look at linezolid as a possibility but unfortunately  there were medication interactions. Also the same was true the Cipro where there were medication interactions. Nonetheless I want to see if we can get him into infectious disease to see if they can work with him to try to find possibly add at home IV antibiotic regimen that can help out at this point. No sharp debridement is can be necessary today. 09/07/2020 upon evaluation today patient appears to be doing unfortunately not too well in regard to his wound. He has been tolerating the dressing changes without complication. Fortunately there is no sign of active infection at this time. No fevers, chills, nausea, vomiting, or diarrhea. The patient did have a repeat surgery going to clean out the abscess and try to work on getting things moving in a better direction about a week and a half ago. He still has sutures in place. With that being said unfortunately he does not appear to be showing signs of a lot of improvement in my opinion. He does have sutures on the plantar aspect still in place  and on the dorsal aspect he has a small opening which actually probes all the way down through and actually came out of the plantar aspect. I feel like that he really is having a lot of issues here and I am afraid that if we do not get this taken care of this is can end up with him having a much larger and more extensive amputation than what he was hoping to have to deal with with this. It has already been since June 2021 but has been dealing with this including all the time since the amputation. 09/14/2020 upon evaluation today patient appears to be doing really about the same in regard to the wound on the dorsal surface of his foot. The plantar foot unfortunately still has sutures in place I really cannot see how things are doing in that regard. With the dorsal foot however this appears to show signs of still having a small opening in the distal aspect which does actually probe down to bone there is also significant  purulent drainage we did actually obtain a wound culture today to further evaluate the issue here. 09/21/2020 upon evaluation today patient appears to be doing about the same in regard to his wound on the dorsal surface of his foot and the plantar foot he still does not have the sutures out at this point. That should hopefully happen in the next week. With that being said the main issue is he continues to have drainage here. It was noted that he had positive findings on culture for Enterobacter. With that being said he cannot take oral penicillin he could do vancomycin and I think that is good to be the ideal thing this to see if we can get him started on IV vancomycin at dialysis. We will get a get in touch with them to try to see if we can coordinate that being done there at the dialysis center. I think that is the ideal situation at this point I would probably start with just 2 weeks of therapy. 09/28/2020 upon evaluation today patient appears to be doing well with regard to his wounds in general. He also has back on the vancomycin after the culture results and this seems to be doing excellent for him. All the wounds are showing signs of good improvement. No fevers, chills, nausea, vomiting, or diarrhea. 10/05/2020 upon evaluation today patient appears to actually be doing excellent in regard to his foot. Fortunately there is a lot of signs of new granulation epithelization at this point. There does not appear to be any evidence of active infection which is great news and overall very pleased with where things stand. In fact the open wound along the plantar aspect of his foot as well as the dorsal part of his foot has dramatically improved since we went forward with the vancomycin and requested that the dialysis center provide this to him by way of IV antibiotics with his dialysis. Overall I think that has made a dramatic improvement and overall his wound seems to be doing significantly better at this  point. No fevers, chills, nausea, vomiting, or diarrhea. The patient was set to have his second opinion appointment with podiatry [facility] next week but to be perfectly honest I am not even certain that that is necessary at this point I do not think there is much that they would be able to do for him nor that he really would want them to do considering how well things look at this time. 10/12/2020  on evaluation today patient appears to be doing excellent in regard to his wounds. In fact the only issue I see is that everything is dried out as far as the collagen is concerned. I do think we can try to keep this moist he will continue to make excellent progress as he has been. Overall I am extremely pleased with what I see today. 10/19/2020 upon evaluation today patient appears to be doing decently well in regard to the wounds on his foot he is can require some debridement in this location. Unfortunately is having some issues with blistering over the leg which is of greater concern at this point. Fortunately I do not see any signs of active infection which is great news. Nonetheless the blistering may just be related to venous insufficiency or subsequently could potentially be an issue with overall worsening with regard to an infection but again it is not really hot to touch. 10/26/2020 upon evaluation today patient appears to be doing well with regard to the majority of his wounds. Unfortunately the dorsal surface of his foot does not appear to be doing quite as well today compared to what it has been doing. I do think that we will need to address that currently. Everything else is showing signs of improvement. He did not get the prescription that I gave him previous as unfortunately the transmission failed. I did not know that until today when the patient mention to me that he never got the antibiotic. 11/02/20 upon evaluation today patient appears to be doing much better in regard to his wounds in general. I  feel like that he is making good progress and even the top of the foot does not appear to be doing too poorly at this time. He did finally get the antibiotics which is good. Unfortunately it does not look like I am able to electronically sent to his pharmacy it kept being an error in transmission every time this was sent. We finally decided to call it in. Nonetheless overall I think he is headed in the right direction based on what I am seeing. KHALEE, OZAN ([id number]) 11/09/2020 upon evaluation today patient appears to be doing well in general in regard to his wounds. I think that he is making some progress here. With that being said there is definitely still some areas of concern. Specifically the dorsal aspect of his foot distally and the lateral aspect of his foot proximally. Both of these areas are still open everything else appears to likely be closed today based on what I am seeing. 4/28; the patient really looks quite good. The area over the base of the right fifth metatarsal and the base of the fifth MTP both look healed to me. 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 They are using Hydrofera Blue rope but I think this is far too small wound now for this 11/23/2020 upon evaluation today patient actually appears to be doing excellent. In fact I do not really see anything that is actually open anywhere at this point. With that being said I am a little reluctant to completely just healing out and discharge him based on the dorsal foot wound being closed even though I do not see anything open we have been there down this road before where things would reopen causing additional problems. Nonetheless I think that we need to monitor him a little bit more closely to make  sure nothing worsens again. 11/30/2020 upon evaluation today patient actually appears to be doing quite well in regard to his wound at this point.  He has been tolerating the dressing changes without complication which is great news and overall I am extremely pleased with where things stand today. No fevers, chills, nausea, vomiting, or diarrhea. Readmission: 04/24/2021 upon evaluation today patient presents for reevaluation here in the clinic this is a patient that I have previously taken care of when he had surgical wounds that had to be healed and this was several months back he in fact healed I believe in May 2022. Subsequently he has an issue now with his foot as he has been walking around more and this has caused an abnormal pressure location. This again is on the foot where he had all the surgery previous. That is on the right plantar foot. With that being said unfortunately the pati having a significant issue here with pressure ent does seem to be which I think is not can be the easiest thing to manage due to the shape of his foot. He is supposed to be getting new diabetic shoes fitted but right now he still has the ones currently that he has been you been using previously. Otherwise none of his major medical problems have shifted at this point. 10/11; second visit for this man who has a area right plantar foot at roughly the fourth metatarsal head. We have been using Hydrofera Blue and a surgical shoe. 05/08/2021 upon evaluation today patient's wound is actually showing signs of being a little bit larger he has a lot of callus around the edges of the wound that is can have to be cleaned away this is some undermining region. Nonetheless I think if we get that cleared away we can actually go and see about put him in the total contact cast with Dr. Dellia Nims discussed with him last week. 05/10/2021 upon evaluation today patient's wound actually appears to be doing great in fact and just the 2 days since we put the cast on I think he has made even some good progress here. Nonetheless he is here for the obligatory first cast change 2 days after  application. No sharp debridement necessary today. Objective Constitutional Well-nourished and well-hydrated in no acute distress. Vitals Time Taken: 9:51 AM, Height: 72 in, Weight: 219 lbs, BMI: 29.7, Temperature: 98.7 F, Pulse: 89 bpm, Respiratory Rate: 16 breaths/min, Blood Pressure: 134/89 mmHg. Respiratory normal breathing without difficulty. Psychiatric this patient is able to make decisions and demonstrates good insight into disease process. Alert and Oriented x 3. pleasant and cooperative. General Notes: Patient's wound again showed signs of doing well we did actually go ahead and reapply the total contact cast today which is I think perfect for him. Nonetheless I do believe that hopefully he will be able to continue to show signs of improvement as we proceed with the cast nothing seem to be a rubbing abnormally Integumentary (Hair, Skin) Wound #10 status is Open. Original cause of wound was Blister. The date acquired was: 04/10/2021. The wound has been in treatment 2 weeks. The wound is located on the [place]. The wound measures 1.8cm length x 2cm width x 0.5cm depth; 2.827cm^2 area and 1.414cm^3 volume. There is Fat Layer (Subcutaneous Tissue) exposed. There is no tunneling or undermining noted. There is a medium amount of serosanguineous drainage noted. The wound margin is thickened. There is large (67-100%) red, pink granulation within the wound bed. There is a small (1-33%)  amount of necrotic tissue within the wound bed including Adherent Slough. THORNELL, VASWANI ([id number]) Assessment Active Problems ICD-10 Type 2 diabetes mellitus with foot ulcer Non-pressure chronic ulcer of other part of right foot with fat layer exposed Paroxysmal atrial fibrillation Long term (current) use of anticoagulants Chronic combined systolic (congestive) and diastolic (congestive) heart failure End stage renal disease Dependence on renal dialysis Procedures Wound  #10 Pre-procedure diagnosis of Wound #10 is a Diabetic Wound/Ulcer of the Lower Extremity located on the Right,Distal,Plantar Foot . There was a Total Contact Cast Procedure by Tommie Sams., PA-C. Post procedure Diagnosis Wound #10: Same as Pre-Procedure Notes: TCC size 3 Applied in clinic. Plan Follow-up Appointments: Return Appointment in 1 week. Return Appointment in: - Thursday 05/10/2021 for cast change Bathing/ Shower/ Hygiene: May shower with wound dressing protected with water repellent cover or cast protector. - Do not get cast wet. If it gets wet call us immediately. If on weekend, go to urgent care or Emergency department for removal. No tub bath. Off-Loading: Total Contact Cast to Right Lower Extremity Other: - offload insert from [facility] WOUND #10: - Foot Wound Laterality: Plantar, Right, Distal Primary Dressing: Hydrofera Blue Ready Transfer Foam, 2.5x2.5 (in/in) (Generic) Every Other Day/15 Days Discharge Instructions: Apply Hydrofera Blue Ready to wound bed as directed Secondary Dressing: Zetuvit Plus Silicone Border Dressing 5x5 (in/in) Every Other Day/15 Days 1. Would recommend currently that we continue with a total contact cast we did reapply today and he seems to be doing great with that we will continue as such. Follow-up 1 week Electronic Signature(s) Signed: 05/10/2021 5:41:17 PM By: Worthy Keeler PA-C Entered By: Worthy Keeler on 05/10/2021 17:41:17 [name] ([id number]) -------------------------------------------------------------------------------- Total Contact Cast Details Patient Name: [name] Date of Service: 05/10/2021 9:45 AM Medical Record Number: [id number] Patient Account Number: [id number] Date of Birth/Sex: 04-Aug-1965 (55 y.o. M) Treating RN: Cornell Barman Primary Care Provider: Vidal Schwalbe Other Clinician: Referring Provider: Vidal Schwalbe Treating Provider/Extender: Skipper Cliche in Treatment:  2 Total Contact Cast Applied for Wound Assessment: Wound #10 Right,Distal,Plantar Foot Performed By: Physician Tommie Sams., PA-C Post Procedure Diagnosis Same as Pre-procedure Notes TCC size 3 Applied in clinic Electronic Signature(s) Signed: 05/10/2021 2:59:00 PM By: Gretta Cool, BSN, RN, CWS, Kim RN, BSN Signed: 05/11/2021 4:28:58 PM By: Worthy Keeler PA-C Entered By: Gretta Cool, BSN, RN, CWS, Kim on 05/10/2021 10:17:41 [name] ([id number]) -------------------------------------------------------------------------------- SuperBill Details Patient Name: [name] Date of Service: 05/10/2021 Medical Record Number: [id number] Patient Account Number: [id number] Date of Birth/Sex: Jul 22, 1966 (55 y.o. M) Treating RN: Cornell Barman Primary Care Provider: Vidal Schwalbe Other Clinician: Referring Provider: Vidal Schwalbe Treating Provider/Extender: Skipper Cliche in Treatment: 2 Diagnosis Coding ICD-10 Codes Code Description [phone] Type 2 diabetes mellitus with foot ulcer 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d I48.0 Paroxysmal atrial fibrillation Z79.01 Long term (current) use of anticoagulants I50.42 Chronic combined systolic (congestive) and diastolic (congestive) heart failure N18.6 End stage renal disease Z99.2 Dependence on renal dialysis Facility Procedures CPT4 Code: [id number] Description: 29445 - APPLY TOTAL CONTACT LEG CAST Modifier: Quantity: 1 CPT4 Code: Description: ICD-10 Diagnosis Description E11.621 Type 2 diabetes mellitus with foot ulcer L97.512 Non-pressure chronic ulcer of other part of right foot with fat layer ex Modifier: posed Quantity: Physician Procedures CPT4 Code: [id number] Description: [id number] - WC PHYS APPLY TOTAL CONTACT CAST Modifier: Quantity: 1 CPT4 Code: Description: ICD-10 Diagnosis Description E11.621 Type 2 diabetes  mellitus with foot ulcer L97.512 Non-pressure chronic ulcer of  other part of right foot with fat layer exp Modifier: osed Quantity: Electronic Signature(s) Signed: 05/10/2021 5:41:32 PM By: Worthy Keeler PA-C Previous Signature: 05/10/2021 2:59:00 PM Version By: Gretta Cool, BSN, RN, CWS, Kim RN, BSN Entered By: Worthy Keeler on 05/10/2021 17:41:32

## 2021-05-10 NOTE — H&P (View-Only) (Signed)
Subjective:    Patient ID: Ralph Peterson, male    DOB: 06/13/1966, 55 y.o.   MRN: [id number] Chief Complaint  Patient presents with   Follow-up    Ultrasound follow up    Ralph Peterson is a 55 year old male that returns to the office for followup of their dialysis access. The function of the access has been stable. The patient denies increased bleeding time or increased recirculation. Patient denies difficulty with cannulation. The patient denies hand pain or other symptoms consistent with steal phenomena.  No significant arm swelling.  The patient denies redness or swelling at the access site. The patient denies fever or chills at home or while on dialysis.  The patient denies amaurosis fugax or recent TIA symptoms. There are no recent neurological changes noted. The patient denies claudication symptoms or rest pain symptoms. The patient denies history of DVT, PE or superficial thrombophlebitis. The patient denies recent episodes of angina or shortness of breath.   Today noninvasive studies show a flow volume of 97.  There is evidence of venous stenosis as well as in the mid forearm.  The bruit has a whistling noise in the midportion       Review of Systems  Musculoskeletal:  Positive for gait problem.  Hematological:  Does not bruise/bleed easily.  All other systems reviewed and are negative.     Objective:   Physical Exam Vitals reviewed.  HENT:     Head: Normocephalic.  Cardiovascular:     Rate and Rhythm: Normal rate.     Pulses:          Radial pulses are 2+ on the right side.     Arteriovenous access: Left arteriovenous access is present.    Comments: Good thrill but whistling in midportion Pulmonary:     Effort: Pulmonary effort is normal.  Skin:    General: Skin is warm and dry.  Neurological:     Mental Status: He is alert and oriented to person, place, and time.  Psychiatric:        Mood and Affect: Mood normal.        Behavior: Behavior  normal.        Thought Content: Thought content normal.        Judgment: Judgment normal.    BP 136/85 (BP Location: Left Arm)   Pulse 88   Resp 16   Wt 232 lb (105.2 kg)   BMI 31.46 kg/m   Past Medical History:  Diagnosis Date   Anxiety    Arrhythmia    atrial fibrillation   Atrial fibrillation (HCC)    CAD (coronary artery disease)    a. 09/2010 Cath/PCI ([facility]): LM nl, LAD 15m D1 80 (small), LCX 368mOM1 30, RI 70 (small), RCA 70 (DES).   CHF (congestive heart failure) (HCC)    Coronary artery disease    COVID-19 virus infection 07/2019   Diabetes mellitus without complication (HCC)    ES[place] ([place] stage renal disease) [place]   Hyperlipidemia    Hypertension    Ischemic cardiomyopathy    a.  12/2011 Echo ([facility]): NL EF, mod LVH. Mild AS/MS, triv PR/TR. 07/2019 Echo: EF 25-30%, GR1 DD, inf/post HK, low nl RV fxn, mildly dil RA, triv MR, mild Ao sclerosis w/o stenosis.   NSTEMI (non-ST elevated myocardial infarction) ([facility]   [place]ipheral artery disease) [place]   a. 03/2016 s/p PTA/DEB R SFA/popliteal/peroneal; b. 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 08/2017 Atherectomy/DEB dist  L SFA/popliteal. PTA of L AT; d. 06/2018 PTA/DEB L SFA/popliteal/PT/AT; e. 01/2019 Stable ABIs.   Sleep apnea     Social History   Socioeconomic History   Marital status: Single    Spouse name: Not on file   Number of children: Not on file   Years of education: Not on file   Highest education level: Not on file  Occupational History   Not on file  Tobacco Use   Smoking status: Former    Types: Cigarettes    Quit date: 2010    Years since quitting: 12.8   Smokeless tobacco: Never  Vaping Use   Vaping Use: Never used  Substance and Sexual Activity   Alcohol use: Not Currently   Drug use: No   Sexual activity: Not on file  Other Topics Concern   Not on file  Social History Narrative   Not on file   Social Determinants of Health   Financial Resource Strain: Not on file  Food  Insecurity: Not on file  Transportation Needs: Not on file  Physical Activity: Not on file  Stress: Not on file  Social Connections: Not on file  Intimate Partner Violence: Not on file    Past Surgical History:  Procedure Laterality Date   ABDOMINAL AORTOGRAM N/A 09/10/2017   Procedure: ABDOMINAL AORTOGRAM;  Surgeon: Wellington Hampshire, MD;  Location: [facility];  Service: Cardiovascular;  Laterality: N/A;   AMPUTATION TOE Bilateral    one toe on right, all five toes on the left   CARDIAC CATHETERIZATION  2012   [facility]; X1 stent 2.5 x 33 mm xience stent Distal TCA   I & D EXTREMITY Right 08/25/2020   Procedure: IRRIGATION AND DEBRIDEMENT OF RIGHT FOOT;  Surgeon: Felipa Furnace, DPM;  Location: [place];  Service: Podiatry;  Laterality: Right;   LOWER EXTREMITY ANGIOGRAPHY Bilateral 09/10/2017   Procedure: Lower Extremity Angiography;  Surgeon: Wellington Hampshire, MD;  Location: [place] [facility];  Service: Cardiovascular;  Laterality: Bilateral;   LOWER EXTREMITY ANGIOGRAPHY Left 06/23/2018   Procedure: LOWER EXTREMITY ANGIOGRAPHY;  Surgeon: Katha Cabal, MD;  Location: [place] [facility];  Service: Cardiovascular;  Laterality: Left;   LOWER EXTREMITY ANGIOGRAPHY Right 02/01/2020   Procedure: LOWER EXTREMITY ANGIOGRAPHY;  Surgeon: Katha Cabal, MD;  Location: [place] [facility];  Service: Cardiovascular;  Laterality: Right;   LOWER EXTREMITY ANGIOGRAPHY Right 08/15/2020   Procedure: LOWER EXTREMITY ANGIOGRAPHY;  Surgeon: Katha Cabal, MD;  Location: [place] [facility];  Service: Cardiovascular;  Laterality: Right;   PERIPHERAL VASCULAR ATHERECTOMY Left 09/10/2017   Procedure: PERIPHERAL VASCULAR ATHERECTOMY;  Surgeon: Wellington Hampshire, MD;  Location: [place] [facility];  Service: Cardiovascular;  Laterality: Left;  SFA/POPLITEAL   PERIPHERAL VASCULAR BALLOON ANGIOPLASTY Left 09/10/2017   Procedure: PERIPHERAL VASCULAR BALLOON ANGIOPLASTY;  Surgeon:  Wellington Hampshire, MD;  Location: [place] [facility];  Service: Cardiovascular;  Laterality: Left;  ANTERIAL TIBIAL   PERIPHERAL VASCULAR CATHETERIZATION Right 03/26/2016   Procedure: Lower Extremity Angiography;  Surgeon: Katha Cabal, MD;  Location: [place] [facility];  Service: Cardiovascular;  Laterality: Right;   PERIPHERAL VASCULAR INTERVENTION Left 09/10/2017   Procedure: PERIPHERAL VASCULAR INTERVENTION;  Surgeon: Wellington Hampshire, MD;  Location: [place] [facility];  Service: Cardiovascular;  Laterality: Left;  SFA/POPLITEAL    Family History  Problem Relation Age of Onset   Hypertension Mother     Allergies  Allergen Reactions  Oxycodone-Acetaminophen Itching   Gabapentin Other (See Comments)    unknown   Tramadol Other (See Comments)    unknown   Penicillins Swelling and Rash    Patient endorses itching/hives and tongue swelling with penicillin and amoxicillin at some point in the past.     CBC Latest Ref Rng & Units 08/28/2020 08/27/2020 08/26/2020  WBC 4.0 - 10.5 K/uL 5.7 4.6 6.2  Hemoglobin 13.0 - 17.0 g/dL 11.3(L) 11.2(L) 11.2(L)  Hematocrit 39.0 - 52.0 % 34.9(L) 33.8(L) 33.8(L)  Platelets 150 - 400 K/uL 114(L) 110(L) 121(L)      CMP     Component Value Date/Time   NA 128 (L) 08/28/2020 0345   NA 139 03/19/2016 1423   K 6.1 (H) 08/28/2020 0345   CL 92 (L) 08/28/2020 0345   CO2 22 08/28/2020 0345   GLUCOSE 103 (H) 08/28/2020 0345   BUN 51 (H) 08/28/2020 0345   BUN 33 (H) 03/19/2016 1423   CREATININE 7.91 (H) 08/28/2020 0345   CALCIUM 8.6 (L) 08/28/2020 0345   PROT 6.9 08/24/2020 1500   PROT 6.8 03/19/2016 1423   ALBUMIN 2.7 (L) 08/28/2020 0345   ALBUMIN 4.1 03/19/2016 1423   AST 20 08/24/2020 1500   ALT 13 08/24/2020 1500   ALKPHOS 109 08/24/2020 1500   BILITOT 1.0 08/24/2020 1500   BILITOT 0.4 03/19/2016 1423   GFRNONAA 8 (L) 08/28/2020 0345   GFRAA 10 (L) 03/10/2020 0440     No results found.     Assessment & Plan:   1. ESRD (end stage  renal disease) ([place]) Recommend:  The patient is experiencing increasing problems with their dialysis access.  Patient should have a fistulagram with the intention for intervention.  The intention for intervention is to restore appropriate flow and prevent thrombosis and possible loss of the access.  As well as improve the quality of dialysis therapy.  The risks, benefits and alternative therapies were reviewed in detail with the patient.  All questions were answered.  The patient agrees to proceed with angio/intervention.      2. Primary hypertension Continue antihypertensive medications as already ordered, these medications have been reviewed and there are no changes at this time.   3. Type 2 diabetes mellitus with chronic kidney disease on chronic dialysis, without long-term current use of insulin (HCC) Continue hypoglycemic medications as already ordered, these medications have been reviewed and there are no changes at this time.  Hgb A1C to be monitored as already arranged by primary service    Current Outpatient Medications on File Prior to Visit  Medication Sig Dispense Refill   apixaban (ELIQUIS) 5 MG TABS tablet Take 1 tablet (5 mg total) by mouth 2 (two) times daily. 60 tablet 3   atorvastatin (LIPITOR) 80 MG tablet Take by mouth.     cinacalcet (SENSIPAR) 60 MG tablet Take 60 mg by mouth at bedtime.     Doxercalciferol (HECTOROL IV) Doxercalciferol (Hectorol)     escitalopram (LEXAPRO) 10 MG tablet Take 10 mg by mouth at bedtime.     gentamicin cream (GARAMYCIN) 0.1 % Apply 1 application topically 2 (two) times daily. 30 g 1   glipiZIDE (GLUCOTROL) 5 MG tablet Take 5 mg by mouth daily.     loperamide (IMODIUM) 2 MG capsule Take 2 mg by mouth daily as needed for diarrhea or loose stools.     metoprolol succinate (TOPROL-XL) 50 MG 24 hr tablet Take 50 mg by mouth 2 (two) times daily.     montelukast (SINGULAIR)  10 MG tablet Take 10 mg by mouth at bedtime.     omeprazole  (PRILOSEC) 20 MG capsule Take 20 mg by mouth at bedtime.     sevelamer carbonate (RENVELA) 800 MG tablet Take 1,600-3,200 mg by mouth See admin instructions. Take 4 tablets ('3200mg'$ ) three times daily with meals and take 2 tablet (1600 mg) by mouth with snacks     valsartan (DIOVAN) 40 MG tablet Take by mouth.     ciprofloxacin (CIPRO) 500 MG tablet Take 1 tablet (500 mg total) by mouth 2 (two) times daily. (Patient not taking: No sig reported) 20 tablet 0   doxycycline (VIBRA-TABS) 100 MG tablet Take 1 tablet (100 mg total) by mouth 2 (two) times daily. (Patient not taking: Reported on 05/10/2021) 20 tablet 0   insulin detemir (LEVEMIR) 100 UNIT/ML injection Inject 0.05 mLs (5 Units total) into the skin daily. (Patient taking differently: Inject 5 Units into the skin at bedtime.)     lanthanum (FOSRENOL) 1000 MG chewable tablet  (Patient not taking: Reported on 05/10/2021)     No current facility-administered medications on file prior to visit.    There are no Patient Instructions on file for this visit. No follow-ups on file.   Kris Hartmann, NP

## 2021-05-10 NOTE — Telephone Encounter (Signed)
I attempted to contact the patient to schedule him for a right arm fistulagram. A message was left for a return call.

## 2021-05-11 ENCOUNTER — Ambulatory Visit: Payer: Medicare PPO | Admitting: Podiatry

## 2021-05-11 NOTE — Telephone Encounter (Signed)
Patient returned my call and is scheduled for a right arm fistulagram with Dr. Delana Meyer on 05/22/21 with a 8:30 am at the [facility]. Pre-procedure instructions were discussed and will be mailed.

## 2021-05-15 ENCOUNTER — Encounter: Payer: Medicare PPO | Admitting: Physician Assistant

## 2021-05-17 ENCOUNTER — Encounter: Payer: Medicare PPO | Admitting: Physician Assistant

## 2021-05-17 ENCOUNTER — Other Ambulatory Visit: Payer: Self-pay

## 2021-05-17 DIAGNOSIS — E11621 Type 2 diabetes mellitus with foot ulcer: Secondary | ICD-10-CM | POA: Diagnosis not present

## 2021-05-17 NOTE — Progress Notes (Addendum)
HASKELL, RIHN ([id number]) Visit Report for 05/17/2021 Chief Complaint Document Details Patient Name: Ralph Peterson, Ralph Peterson. Date of Service: 05/17/2021 10:45 AM Medical Record Number: [id number] Patient Account Number: [id number] Date of Birth/Sex: [birth year] (55 y.o. 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 M) Treating RN: Cornell Barman Primary Care Provider: Vidal Schwalbe Other Clinician: Referring Provider: Vidal Schwalbe Treating Provider/Extender: Skipper Cliche in Treatment: 3 Debridement Performed for Wound #10 Right,Distal,Plantar Foot Assessment: Performed By: Physician Tommie Sams., PA-C Debridement Type: Debridement Severity of Tissue Pre Debridement: Fat layer exposed Level of Consciousness (Pre- Awake and Alert procedure): Pre-procedure Verification/Time Out Yes - 11:28 Taken: Total Area Debrided (L x W): 1.8 (cm) x 1.6 (cm) = 2.88 (cm) Tissue and other material Viable, Non-Viable, Callus, Slough, Subcutaneous, Slough debrided: Level: Skin/Subcutaneous Tissue Debridement Description: Excisional Instrument: Curette Bleeding: Minimum Hemostasis Achieved: Pressure Response to Treatment: Procedure was tolerated well Level of Consciousness  (Post- Awake and Alert procedure): Post Debridement Measurements of Total Wound Length: (cm) 1.8 Width: (cm) 1.7 Depth: (cm) 0.6 Volume: (cm) 1.442 Character of Wound/Ulcer Post Debridement: Stable Severity of Tissue Post Debridement: Fat layer exposed Post Procedure Diagnosis Same as Pre-procedure Electronic Signature(s) Signed: 05/17/2021 4:43:43 PM By: Worthy Keeler PA-C Signed: 05/17/2021 5:37:36 PM By: Gretta Cool, BSN, RN, CWS, Kim RN, BSN Entered By: Gretta Cool, BSN, RN, CWS, Kim on 05/17/2021 11:29:30 Ralph Peterson ([id number]) -------------------------------------------------------------------------------- HPI Details Patient Name: Ralph Peterson. Date of Service: 05/17/2021 10:45 AM Medical Record Number: [id number] Patient Account Number: [id number] Date of Birth/Sex: 07-28-1965 (55 y.o. M) Treating RN: Cornell Barman Primary Care Provider: Vidal Schwalbe Other Clinician: Referring Provider: Vidal Schwalbe Treating Provider/Extender: Skipper Cliche in Treatment: 3 History of Present Illness HPI Description: 08/17/2020 upon evaluation today patient actually appears to be doing decently well today which is good news in regard to the dorsal wound which is almost healed and the lateral wound also doing much better. With regard to the plantar wound this is still draining purulent drainage unfortunately the culture did reveal that he had Pseudomonas as well as Enterococcus. With that being said he is allergic to penicillin which took away the only oral medication for the Enterococcus I did look at linezolid as a possibility but unfortunately there were medication interactions. Also the same was true the Cipro where there were medication interactions. Nonetheless I want to see if we can get him into infectious disease to see if they can work with him to try to find possibly add at home IV antibiotic regimen that can help out at this point. No sharp debridement is can be  necessary today. 09/07/2020 upon evaluation today patient appears to be doing unfortunately not too well in regard to his wound. He has been tolerating the dressing changes without complication. Fortunately there is no sign of active infection at this time. No fevers, chills, nausea, vomiting, or diarrhea. The patient did have a repeat surgery going to clean out the abscess and try to work on getting  things moving in a better direction about a week and a half ago. He still has sutures in place. With that being said unfortunately he does not appear to be showing signs of a lot of improvement in my opinion. He does have sutures on the plantar aspect still in place and on the dorsal aspect he has a small opening which actually probes all the way down through and actually came out of the plantar aspect. I feel like that he really is having a lot of issues here and I am afraid that if we do not get this taken care of this is can end up with him having a much larger and more extensive amputation than what he was hoping to have to deal with with this. It has already been since June 2021 but has been dealing with this including all the time since the amputation. 09/14/2020 upon evaluation today patient appears to be doing really about the same in regard to the wound on the dorsal surface of his foot. The plantar foot unfortunately still has sutures in place I really cannot see how things are doing in that regard. With the dorsal foot however this appears to show signs of still having a small opening in the distal aspect which does actually probe down to bone there is also significant purulent drainage we did actually obtain a wound culture today to further evaluate the issue here. 09/21/2020 upon evaluation today patient appears to be doing about the same in regard to his wound on the dorsal surface of his foot and the plantar foot he still does not have the sutures out at this point. That should hopefully happen  in the next week. With that being said the main issue is he continues to have drainage here. It was noted that he had positive findings on culture for Enterobacter. With that being said he cannot take oral penicillin he could do vancomycin and I think that is good to be the ideal thing this to see if we can get him started on IV vancomycin at dialysis. We will get a get in touch with them to try to see if we can coordinate that being done there at the dialysis center. I think that is the ideal situation at this point I would probably start with just 2 weeks of therapy. 09/28/2020 upon evaluation today patient appears to be doing well with regard to his wounds in general. He also has back on the vancomycin after the culture results and this seems to be doing excellent for him. All the wounds are showing signs of good improvement. No fevers, chills, nausea, vomiting, or diarrhea. 10/05/2020 upon evaluation today patient appears to actually be doing excellent in regard to his foot. Fortunately there is a lot of signs of new granulation epithelization at this point. There does not appear to be any evidence of active infection which is great news and overall very pleased with where things stand. In fact the open wound along the plantar aspect of his foot as well as the dorsal part of his foot has dramatically improved since we went forward with the vancomycin and requested that the dialysis center provide this to him by way of IV antibiotics with his dialysis. Overall I think that has made a dramatic improvement and overall his wound seems to be doing significantly better at this point. No fevers, chills, nausea, vomiting, or diarrhea. The patient was set to have his second opinion appointment with podiatry [facility] next week but  to be perfectly honest I am not even certain that that is necessary at this point I do not think there is much that they would be able to do for him nor that he really would want them to  do considering how well things look at this time. 10/12/2020 on evaluation today patient appears to be doing excellent in regard to his wounds. In fact the only issue I see is that everything is dried out as far as the collagen is concerned. I do think we can try to keep this moist he will continue to make excellent progress as he has been. Overall I am extremely pleased with what I see today. 10/19/2020 upon evaluation today patient appears to be doing decently well in regard to the wounds on his foot he is can require some debridement in this location. Unfortunately is having some issues with blistering over the leg which is of greater concern at this point. Fortunately I do not see any signs of active infection which is great news. Nonetheless the blistering may just be related to venous insufficiency or subsequently could potentially be an issue with overall worsening with regard to an infection but again it is not really hot to touch. 10/26/2020 upon evaluation today patient appears to be doing well with regard to the majority of his wounds. Unfortunately the dorsal surface of his foot does not appear to be doing quite as well today compared to what it has been doing. I do think that we will need to address that currently. Everything else is showing signs of improvement. He did not get the prescription that I gave him previous as unfortunately the transmission failed. I did not know that until today when the patient mention to me that he never got the antibiotic. 11/02/20 upon evaluation today patient appears to be doing much better in regard to his wounds in general. I feel like that he is making good progress and even the top of the foot does not appear to be doing too poorly at this time. He did finally get the antibiotics which is good. Unfortunately it does not look like I am able to electronically sent to his pharmacy it kept being an error in transmission every time this was sent. We finally  decided to call it in. Nonetheless overall I think he is headed in the right direction based on what I am seeing. 11/09/2020 upon evaluation today patient appears to be doing well in general in regard to his wounds. I think that he is making some progress here. With that being said there is definitely still some areas of concern. Specifically the dorsal aspect of his foot distally and the lateral aspect of his foot proximally. Both of these areas are still open everything else appears to likely be closed today based on what I am seeing. 4/28; the patient really looks quite good. The area over the base of the right fifth metatarsal and the base of the fifth MTP both look healed to me. 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 They are using Hydrofera Blue rope but I think this is far too small wound now for this 11/23/2020 upon evaluation today patient actually appears to be doing excellent. In fact I do not really see anything that is actually open anywhere at this point. With that being said I am a little reluctant to completely just healing  out and discharge him based on the dorsal foot wound being closed even though I do not see anything open we have been there down this road before where things would reopen causing additional problems. Nonetheless I think that we need to monitor him a little bit more closely to make sure nothing worsens again. 11/30/2020 upon evaluation today patient actually appears to be doing quite well in regard to his wound at this point. He has been tolerating the dressing changes without complication which is great news and overall I am extremely pleased with where things stand today. No fevers, chills, nausea, vomiting, or diarrhea. Readmission: 04/24/2021 upon evaluation today patient presents for reevaluation here in the clinic this is a patient that I have  previously taken care of when he had surgical wounds that had to be healed and this was several months back he in fact healed I believe in May 2022. Subsequently he has an issue now with his foot as he has been walking around more and this has caused an abnormal pressure location. This again is on the foot where he had all the surgery previous. That is on the right plantar foot. With that being said unfortunately the pati having a significant issue here with pressure ent does seem to be which I think is not can be the easiest thing to manage due to the shape of his foot. He is supposed to be getting new diabetic shoes fitted but right now he still has the ones currently that he has been you been using previously. Otherwise none of his major medical problems have shifted at this point. 10/11; second visit for this man who has a area right plantar foot at roughly the fourth metatarsal head. We have been using Hydrofera Blue and a surgical shoe. 05/08/2021 upon evaluation today patient's wound is actually showing signs of being a little bit larger he has a lot of callus around the edges of the wound that is can have to be cleaned away this is some undermining region. Nonetheless I think if we get that cleared away we can actually go and see about put him in the total contact cast with Dr. Dellia Nims discussed with him last week. 05/10/2021 upon evaluation today patient's wound actually appears to be doing great in fact and just the 2 days since we put the cast on I think he has made even some good progress here. Nonetheless he is here for the obligatory first cast change 2 days after application. No sharp debridement necessary today. 05/17/2021 upon evaluation today patient appears to be doing well with regard to his plantar foot ulcer. I do feel like that there is much less in the way of breakdown and deterioration today as compared to his last visit. And that is even better than the time before which was  when we first put the cast on. Either way I think that we are headed in the right direction though there is bone in the base of the wound just underneath some tissue were not completely exposed but this is something that we need to keep a close eye on. I am concerned about how things are progressing in 1 to make sure that we keep as much pressure off as possible I think the total contact cast is good to be perfect for [place] to continue. Electronic Signature(s) Signed: 05/17/2021 1:18:29 PM By: Worthy Keeler PA-C Entered By: Worthy Keeler on 05/17/2021 13:18:29 Ralph Peterson ([id number]) -------------------------------------------------------------------------------- Physical Exam Details Patient Name:  Knotts, Taro S. Date of Service: 05/17/2021 10:45 AM Medical Record Number: [id number] Patient Account Number: [id number] Date of Birth/Sex: Aug 12, 1965 (55 y.o. M) Treating RN: Cornell Barman Primary Care Provider: Vidal Schwalbe Other Clinician: Referring Provider: Vidal Schwalbe Treating Provider/Extender: Skipper Cliche in Treatment: 3 Constitutional Well-nourished and well-hydrated in no acute distress. Respiratory normal breathing without difficulty. Psychiatric this patient is able to make decisions and demonstrates good insight into disease process. Alert and Oriented x 3. pleasant and cooperative. Notes Upon inspection patient's wound bed actually showed signs of some necrotic tissue as well as some callus around the edges of the wound to had to clear away. I was able to do both today without complication and postdebridement the wound bed appears to be doing much better which is great news. Overall I am extremely pleased with where we stand at this point. Electronic Signature(s) Signed: 05/17/2021 1:18:49 PM By: Worthy Keeler PA-C Entered By: Worthy Keeler on 05/17/2021 13:18:48 Ralph Peterson  ([id number]) -------------------------------------------------------------------------------- Physician Orders Details Patient Name: Ralph Peterson Date of Service: 05/17/2021 10:45 AM Medical Record Number: [id number] Patient Account Number: [id number] Date of Birth/Sex: Jan 14, 1966 (55 y.o. M) Treating RN: Cornell Barman Primary Care Provider: Vidal Schwalbe Other Clinician: Referring Provider: Vidal Schwalbe Treating Provider/Extender: Skipper Cliche in Treatment: 3 Verbal / Phone Orders: No Diagnosis Coding ICD-10 Coding Code Description E11.621 Type 2 diabetes mellitus with foot ulcer 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d I48.0 Paroxysmal atrial fibrillation Z79.01 Long term (current) use of anticoagulants I50.42 Chronic combined systolic (congestive) and diastolic (congestive) heart failure 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 o Return Appointment in: - Thursday 05/10/2021 for cast change Bathing/ Shower/ Hygiene o May shower with wound dressing protected with water repellent cover or cast protector. - Do not get cast wet. If it gets wet call us immediately. If on weekend, go to urgent care or Emergency department for removal. o No tub bath. Off-Loading o Total Contact Cast to Right Lower Extremity o Other: - offload insert from [facility]ound Treatment Wound #10 - Foot Wound Laterality: Plantar, Right, Distal Primary Dressing: Hydrofera Blue Ready Transfer Foam, 2.5x2.5 (in/in) (Generic) Every Other Day/15 Days Discharge Instructions: Apply Hydrofera Blue Ready to wound bed as directed Secondary Dressing: Zetuvit Plus Silicone Border Dressing 5x5 (in/in) Every Other Day/15 Days Electronic Signature(s) Signed: 05/17/2021 4:43:43 PM By: Worthy Keeler PA-C Signed: 05/17/2021 5:37:36 PM By: Gretta Cool, BSN, RN, CWS, Kim RN,  BSN Entered By: Gretta Cool, BSN, RN, CWS, Kim on 05/17/2021 11:30:15 Ralph Peterson ([id number]) -------------------------------------------------------------------------------- Problem List Details Patient Name: Ralph Peterson. Date of Service: 05/17/2021 10:45 AM Medical Record Number: [id number] Patient Account Number: [id number] Date of Birth/Sex: 26-Feb-1966 (55 y.o. M) Treating RN: Cornell Barman Primary Care Provider: Vidal Schwalbe Other Clinician: Referring Provider: Vidal Schwalbe Treating Provider/Extender: Skipper Cliche in Treatment: 3 Active Problems ICD-10 Encounter Code Description Active Date MDM Diagnosis E11.621 Type 2 diabetes mellitus with foot ulcer 04/24/2021 No Yes L97.512 Non-pressure chronic ulcer of other part of right foot with fat layer 04/24/2021 No Yes exposed I48.0 Paroxysmal atrial fibrillation 04/24/2021 No Yes Z79.01 Long term (current) use of anticoagulants 04/24/2021 No Yes I50.42 Chronic combined systolic (congestive) and diastolic (congestive) heart 04/24/2021 No Yes failure N18.6 End stage renal disease 04/24/2021 No Yes Z99.2 Dependence on renal dialysis 04/24/2021 No Yes Inactive Problems Resolved Problems Electronic Signature(s)  Signed: 05/17/2021 11:08:36 AM By: Worthy Keeler PA-C Entered By: Worthy Keeler on 05/17/2021 11:08:36 Ralph Peterson ([id number]) -------------------------------------------------------------------------------- Progress Note Details Patient Name: Ralph Peterson Date of Service: 05/17/2021 10:45 AM Medical Record Number: [id number] Patient Account Number: [id number] Date of Birth/Sex: [birth year] (55 y.o. M) Treating RN: Cornell Barman Primary Care Provider: Vidal Schwalbe Other Clinician: Referring Provider: Vidal Schwalbe Treating Provider/Extender: Skipper Cliche in Treatment: 3 Subjective Chief Complaint Information obtained from Patient Open surgical ulcer secondary to amputation  right foot and right foot pressure ulcer History of Present Illness (HPI) 08/17/2020 upon evaluation today patient actually appears to be doing decently well today which is good news in regard to the dorsal wound which is almost healed and the lateral wound also doing much better. With regard to the plantar wound this is still draining purulent drainage unfortunately the culture did reveal that he had Pseudomonas as well as Enterococcus. With that being said he is allergic to penicillin which took away the only oral medication for the Enterococcus I did look at linezolid as a possibility but unfortunately there were medication interactions. Also the same was true the Cipro where there were medication interactions. Nonetheless I want to see if we can get him into infectious disease to see if they can work with him to try to find possibly add at home IV antibiotic regimen that can help out at this point. No sharp debridement is can be necessary today. 09/07/2020 upon evaluation today patient appears to be doing unfortunately not too well in regard to his wound. He has been tolerating the dressing changes without complication. Fortunately there is no sign of active infection at this time. No fevers, chills, nausea, vomiting, or diarrhea. The patient did have a repeat surgery going to clean out the abscess and try to work on getting things moving in a better direction about a week and a half ago. He still has sutures in place. With that being said unfortunately he does not appear to be showing signs of a lot of improvement in my opinion. He does have sutures on the plantar aspect still in place and on the dorsal aspect he has a small opening which actually probes all the way down through and actually came out of the plantar aspect. I feel like that he really is having a lot of issues here and I am afraid that if we do not get this taken care of this is can end up with him having a much larger and more  extensive amputation than what he was hoping to have to deal with with this. It has already been since June 2021 but has been dealing with this including all the time since the amputation. 09/14/2020 upon evaluation today patient appears to be doing really about the same in regard to the wound on the dorsal surface of his foot. The plantar foot unfortunately still has sutures in place I really cannot see how things are doing in that regard. With the dorsal foot however this appears to show signs of still having a small opening in the distal aspect which does actually probe down to bone there is also significant purulent drainage we did actually obtain a wound culture today to further evaluate the issue here. 09/21/2020 upon evaluation today patient appears to be doing about the same in regard to his wound on the dorsal surface of his foot and the plantar foot he still does not have the sutures out at this point. That  should hopefully happen in the next week. With that being said the main issue is he continues to have drainage here. It was noted that he had positive findings on culture for Enterobacter. With that being said he cannot take oral penicillin he could do vancomycin and I think that is good to be the ideal thing this to see if we can get him started on IV vancomycin at dialysis. We will get a get in touch with them to try to see if we can coordinate that being done there at the dialysis center. I think that is the ideal situation at this point I would probably start with just 2 weeks of therapy. 09/28/2020 upon evaluation today patient appears to be doing well with regard to his wounds in general. He also has back on the vancomycin after the culture results and this seems to be doing excellent for him. All the wounds are showing signs of good improvement. No fevers, chills, nausea, vomiting, or diarrhea. 10/05/2020 upon evaluation today patient appears to actually be doing excellent in regard to  his foot. Fortunately there is a lot of signs of new granulation epithelization at this point. There does not appear to be any evidence of active infection which is great news and overall very pleased with where things stand. In fact the open wound along the plantar aspect of his foot as well as the dorsal part of his foot has dramatically improved since we went forward with the vancomycin and requested that the dialysis center provide this to him by way of IV antibiotics with his dialysis. Overall I think that has made a dramatic improvement and overall his wound seems to be doing significantly better at this point. No fevers, chills, nausea, vomiting, or diarrhea. The patient was set to have his second opinion appointment with podiatry [facility] next week but to be perfectly honest I am not even certain that that is necessary at this point I do not think there is much that they would be able to do for him nor that he really would want them to do considering how well things look at this time. 10/12/2020 on evaluation today patient appears to be doing excellent in regard to his wounds. In fact the only issue I see is that everything is dried out as far as the collagen is concerned. I do think we can try to keep this moist he will continue to make excellent progress as he has been. Overall I am extremely pleased with what I see today. 10/19/2020 upon evaluation today patient appears to be doing decently well in regard to the wounds on his foot he is can require some debridement in this location. Unfortunately is having some issues with blistering over the leg which is of greater concern at this point. Fortunately I do not see any signs of active infection which is great news. Nonetheless the blistering may just be related to venous insufficiency or subsequently could potentially be an issue with overall worsening with regard to an infection but again it is not really hot to touch. 10/26/2020 upon evaluation  today patient appears to be doing well with regard to the majority of his wounds. Unfortunately the dorsal surface of his foot does not appear to be doing quite as well today compared to what it has been doing. I do think that we will need to address that currently. Everything else is showing signs of improvement. He did not get the prescription that I gave him previous as unfortunately  the transmission failed. I did not know that until today when the patient mention to me that he never got the antibiotic. 11/02/20 upon evaluation today patient appears to be doing much better in regard to his wounds in general. I feel like that he is making good progress and even the top of the foot does not appear to be doing too poorly at this time. He did finally get the antibiotics which is good. Unfortunately it does not look like I am able to electronically sent to his pharmacy it kept being an error in transmission every time this was sent. We finally decided to call it in. Nonetheless overall I think he is headed in the right direction based on what I am seeing. SHANAN, MCMILLER ([id number]) 11/09/2020 upon evaluation today patient appears to be doing well in general in regard to his wounds. I think that he is making some progress here. With that being said there is definitely still some areas of concern. Specifically the dorsal aspect of his foot distally and the lateral aspect of his foot proximally. Both of these areas are still open everything else appears to likely be closed today based on what I am seeing. 4/28; the patient really looks quite good. The area over the base of the right fifth metatarsal and the base of the fifth MTP both look healed to me. 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 They are using Hydrofera Blue rope but I think this is far too small wound now for this 11/23/2020 upon evaluation today patient  actually appears to be doing excellent. In fact I do not really see anything that is actually open anywhere at this point. With that being said I am a little reluctant to completely just healing out and discharge him based on the dorsal foot wound being closed even though I do not see anything open we have been there down this road before where things would reopen causing additional problems. Nonetheless I think that we need to monitor him a little bit more closely to make sure nothing worsens again. 11/30/2020 upon evaluation today patient actually appears to be doing quite well in regard to his wound at this point. He has been tolerating the dressing changes without complication which is great news and overall I am extremely pleased with where things stand today. No fevers, chills, nausea, vomiting, or diarrhea. Readmission: 04/24/2021 upon evaluation today patient presents for reevaluation here in the clinic this is a patient that I have previously taken care of when he had surgical wounds that had to be healed and this was several months back he in fact healed I believe in May 2022. Subsequently he has an issue now with his foot as he has been walking around more and this has caused an abnormal pressure location. This again is on the foot where he had all the surgery previous. That is on the right plantar foot. With that being said unfortunately the pati having a significant issue here with pressure ent does seem to be which I think is not can be the easiest thing to manage due to the shape of his foot. He is supposed to be getting new diabetic shoes fitted but right now he still has the ones currently that he has been you been using previously. Otherwise none of his major medical problems have shifted at this point. 10/11; second visit for this man who  has a area right plantar foot at roughly the fourth metatarsal head. We have been using Hydrofera Blue and a surgical shoe. 05/08/2021 upon  evaluation today patient's wound is actually showing signs of being a little bit larger he has a lot of callus around the edges of the wound that is can have to be cleaned away this is some undermining region. Nonetheless I think if we get that cleared away we can actually go and see about put him in the total contact cast with Dr. Dellia Nims discussed with him last week. 05/10/2021 upon evaluation today patient's wound actually appears to be doing great in fact and just the 2 days since we put the cast on I think he has made even some good progress here. Nonetheless he is here for the obligatory first cast change 2 days after application. No sharp debridement necessary today. 05/17/2021 upon evaluation today patient appears to be doing well with regard to his plantar foot ulcer. I do feel like that there is much less in the way of breakdown and deterioration today as compared to his last visit. And that is even better than the time before which was when we first put the cast on. Either way I think that we are headed in the right direction though there is bone in the base of the wound just underneath some tissue were not completely exposed but this is something that we need to keep a close eye on. I am concerned about how things are progressing in 1 to make sure that we keep as much pressure off as possible I think the total contact cast is good to be perfect for [place] to continue. Objective Constitutional Well-nourished and well-hydrated in no acute distress. Vitals Time Taken: 11:03 AM, Height: 72 in, Weight: 219 lbs, BMI: 29.7, Temperature: 98.3 F, Pulse: 97 bpm, Respiratory Rate: 16 breaths/min, Blood Pressure: 147/91 mmHg. Respiratory normal breathing without difficulty. Psychiatric this patient is able to make decisions and demonstrates good insight into disease process. Alert and Oriented x 3. pleasant and cooperative. General Notes: Upon inspection patient's wound bed actually showed signs of  some necrotic tissue as well as some callus around the edges of the wound to had to clear away. I was able to do both today without complication and postdebridement the wound bed appears to be doing much better which is great news. Overall I am extremely pleased with where we stand at this point. Integumentary (Hair, Skin) Wound #10 status is Open. Original cause of wound was Blister. The date acquired was: 04/10/2021. The wound has been in treatment 3 weeks. The wound is located on the [place]. The wound measures 1.8cm length x 1.6cm width x 0.6cm depth; 2.262cm^2 area and 1.357cm^3 volume. There is Fat Layer (Subcutaneous Tissue) exposed. There is undermining starting at 10:00 and ending at 7:00 with a maximum distance of 0.4cm. There is a medium amount of serosanguineous drainage noted. The wound margin is thickened. There is large (67- 100%) red, pink granulation within the wound bed. There is a small (1-33%) amount of necrotic tissue within the wound bed including Adherent SEVERUS, BRODZINSKI. ([id number]) [place]. General Notes: callus lip surrounding wound. Assessment Active Problems ICD-10 Type 2 diabetes mellitus with foot ulcer Non-pressure chronic ulcer of other part of right foot with fat layer exposed Paroxysmal atrial fibrillation Long term (current) use of anticoagulants Chronic combined systolic (congestive) and diastolic (congestive) heart failure End stage renal disease Dependence on renal dialysis Procedures Wound #10 Pre-procedure diagnosis of  Wound #10 is a Diabetic Wound/Ulcer of the Lower Extremity located on the [place] .Severity of Tissue Pre Debridement is: Fat layer exposed. There was a Excisional Skin/Subcutaneous Tissue Debridement with a total area of 2.88 sq cm performed by Tommie Sams., PA-C. With the following instrument(s): Curette to remove Viable and Non-Viable tissue/material. Material removed includes Callus,  Subcutaneous Tissue, and Slough. No specimens were taken. A time out was conducted at 11:28, prior to the start of the procedure. A Minimum amount of bleeding was controlled with Pressure. The procedure was tolerated well. Post Debridement Measurements: 1.8cm length x 1.7cm width x 0.6cm depth; 1.442cm^3 volume. Character of Wound/Ulcer Post Debridement is stable. Severity of Tissue Post Debridement is: Fat layer exposed. Post procedure Diagnosis Wound #10: Same as Pre-Procedure Pre-procedure diagnosis of Wound #10 is a Diabetic Wound/Ulcer of the Lower Extremity located on the Right,Distal,Plantar Foot . There was a Total Contact Cast Procedure by Tommie Sams., PA-C. Post procedure Diagnosis Wound #10: Same as Pre-Procedure Notes: TCC cast size #3 applied.. Plan Follow-up Appointments: Return Appointment in 1 week. Return Appointment in: - Thursday 05/10/2021 for cast change Bathing/ Shower/ Hygiene: May shower with wound dressing protected with water repellent cover or cast protector. - Do not get cast wet. If it gets wet call us immediately. If on weekend, go to urgent care or Emergency department for removal. No tub bath. Off-Loading: Total Contact Cast to Right Lower Extremity Other: - offload insert from [facility] WOUND #10: - Foot Wound Laterality: Plantar, Right, Distal Primary Dressing: Hydrofera Blue Ready Transfer Foam, 2.5x2.5 (in/in) (Generic) Every Other Day/15 Days Discharge Instructions: Apply Hydrofera Blue Ready to wound bed as directed Secondary Dressing: Zetuvit Plus Silicone Border Dressing 5x5 (in/in) Every Other Day/15 Days 1. Would recommend currently that we going to continue with the wound care measures as before and the patient is in agreement the plan. This includes the use of the [facility] which I think is doing a good job. 2. I am also can recommend that we continue with the Zetuvit to cover. 3. I am also can recommend patient should continue with the  total contact cast that was reapplied by myself today he tolerated that application without complication. We will see patient back for reevaluation in 1 week here in the clinic. If anything worsens or changes patient will contact our office for additional recommendations. AVYUKT, CIMO ([id number]) Electronic Signature(s) Signed: 05/17/2021 1:19:18 PM By: Worthy Keeler PA-C Entered By: Worthy Keeler on 05/17/2021 13:19:17 Ralph Peterson ([id number]) -------------------------------------------------------------------------------- Total Contact Cast Details Patient Name: Ralph Peterson Date of Service: 05/17/2021 10:45 AM Medical Record Number: [id number] Patient Account Number: [id number] Date of Birth/Sex: Dec 06, 1965 (55 y.o. M) Treating RN: Cornell Barman Primary Care Provider: Vidal Schwalbe Other Clinician: Referring Provider: Vidal Schwalbe Treating Provider/Extender: Skipper Cliche in Treatment: 3 Total Contact Cast Applied for Wound Assessment: Wound #10 Right,Distal,Plantar Foot Performed By: Physician Tommie Sams., PA-C Post Procedure Diagnosis Same as Pre-procedure Notes TCC cast size #3 applied. Electronic Signature(s) Signed: 05/17/2021 4:43:43 PM By: Worthy Keeler PA-C Signed: 05/17/2021 5:37:36 PM By: Gretta Cool, BSN, RN, CWS, Kim RN, BSN Entered By: Gretta Cool, BSN, RN, CWS, Kim on 05/17/2021 11:30:55 Ralph Peterson ([id number]) -------------------------------------------------------------------------------- SuperBill Details Patient Name: Ralph Peterson Date of Service: 05/17/2021 Medical Record Number: [id number] Patient Account Number: [id number] Date of Birth/Sex: [birth year] (55 y.o. M) Treating RN: Cornell Barman Primary Care Provider: Vidal Schwalbe Other Clinician: Referring Provider: Vidal Schwalbe  Treating Provider/Extender: Jeri Cos Weeks in Treatment: 3 Diagnosis Coding ICD-10 Codes Code Description E11.621 Type 2  diabetes mellitus with foot ulcer 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d I48.0 Paroxysmal atrial fibrillation Z79.01 Long term (current) use of anticoagulants I50.42 Chronic combined systolic (congestive) and diastolic (congestive) heart failure N18.6 End stage renal disease Z99.2 Dependence on renal dialysis Facility Procedures CPT4 Code: [id number] Description: 43200 - DEB SUBQ TISSUE 20 SQ CM/< Modifier: Quantity: 1 CPT4 Code: Description: ICD-10 Diagnosis Description L97.512 Non-pressure chronic ulcer of other part of right foot with fat layer ex Modifier: posed Quantity: Physician Procedures CPT4 Code: 3794446 Description: 11042 - WC PHYS SUBQ TISS 20 SQ CM Modifier: Quantity: 1 CPT4 Code: Description: ICD-10 Diagnosis Description L97.512 Non-pressure chronic ulcer of other part of right foot with fat layer ex Modifier: posed Quantity: Electronic Signature(s) Signed: 05/17/2021 1:19:28 PM By: Worthy Keeler PA-C Entered By: Worthy Keeler on 05/17/2021 13:19:27

## 2021-05-17 NOTE — Progress Notes (Signed)
Ralph Peterson ([id number]) Visit Report for 05/17/2021 Arrival Information Details Patient Name: Ralph Peterson. Date of Service: 05/17/2021 10:45 AM Medical Record Number: [id number] Patient Account Number: [id number] Date of Birth/Sex: May 22, 1966 (55 y.o. M) Treating RN: Cornell Barman Primary Care Promise Bushong: Vidal Schwalbe Other Clinician: Referring Mansa Willers: Vidal Schwalbe Treating Esteban Kobashigawa/Extender: Skipper Cliche in Treatment: 3 Visit Information History Since Last Visit Added or deleted any medications: No Patient Arrived: Ambulatory Has Dressing in Place as Prescribed: Yes Arrival Time: 11:01 Has Footwear/Offloading in Place as Prescribed: Yes Accompanied By: Janace Hoard, friend Right: Total Contact Cast Transfer Assistance: None Pain Present Now: No Patient Identification Verified: Yes Secondary Verification Process Completed: Yes Patient Has Alerts: Yes Patient Alerts: Patient on Blood Thinner Eliquis Diabetic AVVS 07/2020 [facility]=Ralph Peterson Electronic Signature(s) Signed: 05/17/2021 5:37:36 PM By: Gretta Cool, BSN, RN, CWS, Kim RN, BSN Entered By: Gretta Cool, BSN, RN, CWS, Kim on 05/17/2021 11:02:45 Ralph Peterson ([id number]) -------------------------------------------------------------------------------- Encounter Discharge Information Details Patient Name: Ralph Peterson. Date of Service: 05/17/2021 10:45 AM Medical Record Number: [id number] Patient Account Number: [id number] Date of Birth/Sex: Sep 15, 1965 (55 y.o. M) Treating RN: Cornell Barman Primary Care Glendora Clouatre: Vidal Schwalbe Other Clinician: Referring Ronrico Dupin: Vidal Schwalbe Treating Darrnell Mangiaracina/Extender: Skipper Cliche in Treatment: 3 Encounter Discharge Information Items Post Procedure Vitals Discharge Condition: Stable Unable to obtain vitals Reason: limited time Ambulatory Status: Ambulatory Discharge Destination: Home Transportation: Private Auto Schedule Follow-up Appointment: No Clinical  Summary of Care: Electronic Signature(s) Signed: 05/17/2021 5:37:36 PM By: Gretta Cool, BSN, RN, CWS, Kim RN, BSN Entered By: Gretta Cool, BSN, RN, CWS, Kim on 05/17/2021 13:04:23 Ralph Peterson ([id number]) -------------------------------------------------------------------------------- Lower Extremity Assessment Details Patient Name: Ralph Peterson. Date of Service: 05/17/2021 10:45 AM Medical Record Number: [id number] Patient Account Number: [id number] Date of Birth/Sex: July 20, 1966 (55 y.o. M) Treating RN: Cornell Barman Primary Care Clarrisa Kaylor: Vidal Schwalbe Other Clinician: Referring Vane Yapp: Vidal Schwalbe Treating Tamina Cyphers/Extender: Skipper Cliche in Treatment: 3 Vascular Assessment Pulses: Dorsalis Pedis Palpable: [Right:Yes] Electronic Signature(s) Signed: 05/17/2021 5:37:36 PM By: Gretta Cool, BSN, RN, CWS, Kim RN, BSN Entered By: Gretta Cool, BSN, RN, CWS, Kim on 05/17/2021 11:15:13 Ralph Peterson ([id number]) -------------------------------------------------------------------------------- Multi Wound Chart Details Patient Name: Ralph Peterson Date of Service: 05/17/2021 10:45 AM Medical Record Number: [id number] Patient Account Number: [id number] Date of Birth/Sex: 1965/11/11 (55 y.o. M) Treating RN: Cornell Barman Primary Care Galadriel Shroff: Vidal Schwalbe Other Clinician: Referring Azure Budnick: Vidal Schwalbe Treating Eriq Hufford/Extender: Skipper Cliche in Treatment: 3 Vital Signs Height(in): 72 Pulse(bpm): 97 Weight(lbs): 219 Blood Pressure(mmHg): 147/91 Body Mass Index(BMI): 30 Temperature(F): 98.3 Respiratory Rate(breaths/min): 16 Photos: [N/A:N/A] Wound Location: Right, Distal, Plantar Foot N/A N/A Wounding Event: Blister N/A N/A Primary Etiology: Diabetic Wound/Ulcer of the Lower N/A N/A Extremity Comorbid History: Arrhythmia, Congestive Heart N/A N/A Failure, Coronary Artery Disease, Hypertension, Myocardial Infarction, Peripheral Venous Disease, Type  II Diabetes, Neuropathy Date Acquired: 04/10/2021 N/A N/A Weeks of Treatment: 3 N/A N/A Wound Status: Open N/A N/A Measurements L x W x D (cm) 1.8x1.6x0.6 N/A N/A Area (cm) : 2.262 N/A N/A Volume (cm) : 1.357 N/A N/A % Reduction in Area: -260.20% N/A N/A % Reduction in Volume: -977.00% N/A N/A Starting Position 1 (o'clock): 10 Ending Position 1 (o'clock): 7 Maximum Distance 1 (cm): 0.4 Undermining: Yes N/A N/A Classification: Grade 2 N/A N/A Exudate Amount: Medium N/A N/A Exudate Type: Serosanguineous N/A N/A Exudate Color: red, brown N/A N/A Wound Margin: Thickened N/A N/A Granulation Amount: Large (67-100%) N/A N/A Granulation Quality: Red, Pink N/A N/A Necrotic Amount: Small (1-33%)  N/A N/A Exposed Structures: Fat Layer (Subcutaneous Tissue): N/A N/A Yes Fascia: No Tendon: No Muscle: No Joint: No Bone: No Epithelialization: None N/A N/A Assessment Notes: callus lip surrounding wound. N/A N/A Treatment Notes Ralph Peterson ([id number]) Electronic Signature(s) Signed: 05/17/2021 5:37:36 PM By: Gretta Cool, BSN, RN, CWS, Kim RN, BSN Entered By: Gretta Cool, BSN, RN, CWS, Kim on 05/17/2021 11:16:42 Ralph Peterson ([id number]) -------------------------------------------------------------------------------- Multi-Disciplinary Care Plan Details Patient Name: Ralph Peterson. Date of Service: 05/17/2021 10:45 AM Medical Record Number: [id number] Patient Account Number: [id number] Date of Birth/Sex: 12-26-1965 (55 y.o. M) Treating RN: Cornell Barman Primary Care Kealan Buchan: Vidal Schwalbe Other Clinician: Referring Orton Capell: Vidal Schwalbe Treating Londynn Sonoda/Extender: Skipper Cliche in Treatment: 3 Active Inactive Wound/Skin Impairment Nursing Diagnoses: Impaired tissue integrity Knowledge deficit related to smoking impact on wound healing Knowledge deficit related to ulceration/compromised skin integrity Goals: Patient/caregiver will verbalize understanding of  skin care regimen Date Initiated: 04/24/2021 Target Resolution Date: 05/11/2021 Goal Status: Active Ulcer/skin breakdown will have a volume reduction of 30% by week 4 Date Initiated: 04/24/2021 Target Resolution Date: 05/24/2021 Goal Status: Active Ulcer/skin breakdown will have a volume reduction of 50% by week 8 Date Initiated: 04/24/2021 Target Resolution Date: 06/23/2021 Goal Status: Active Ulcer/skin breakdown will have a volume reduction of 80% by week 12 Date Initiated: 04/24/2021 Target Resolution Date: 07/24/2021 Goal Status: Active Ulcer/skin breakdown will heal within 14 weeks Date Initiated: 04/24/2021 Target Resolution Date: 08/05/2021 Goal Status: Active Interventions: Assess patient/caregiver ability to obtain necessary supplies Assess patient/caregiver ability to perform ulcer/skin care regimen upon admission and as needed Assess ulceration(s) every visit Notes: Electronic Signature(s) Signed: 05/17/2021 5:37:36 PM By: Gretta Cool, BSN, RN, CWS, Kim RN, BSN Entered By: Gretta Cool, BSN, RN, CWS, Kim on 05/17/2021 11:16:32 Ralph Peterson ([id number]) -------------------------------------------------------------------------------- Pain Assessment Details Patient Name: Ralph Peterson Date of Service: 05/17/2021 10:45 AM Medical Record Number: [id number] Patient Account Number: [id number] Date of Birth/Sex: 1966-03-25 (55 y.o. M) Treating RN: Cornell Barman Primary Care Kanen Mottola: Vidal Schwalbe Other Clinician: Referring Exander Shaul: Vidal Schwalbe Treating Thatiana Renbarger/Extender: Skipper Cliche in Treatment: 3 Active Problems Location of Pain Severity and Description of Pain Patient Has Paino No Site Locations Pain Management and Medication Current Pain Management: Notes Patient denies pain at this time. Electronic Signature(s) Signed: 05/17/2021 5:37:36 PM By: Gretta Cool, BSN, RN, CWS, Kim RN, BSN Entered By: Gretta Cool, BSN, RN, CWS, Kim on 05/17/2021 11:11:35 Ralph Peterson ([id number]) -------------------------------------------------------------------------------- Patient/Caregiver Education Details Patient Name: Ralph Peterson Date of Service: 05/17/2021 10:45 AM Medical Record Number: [id number] Patient Account Number: [id number] Date of Birth/Gender: 10-19-1965 (55 y.o. M) Treating RN: Cornell Barman Primary Care Physician: Vidal Schwalbe Other Clinician: Referring Physician: Vidal Schwalbe Treating Physician/Extender: Skipper Cliche in Treatment: 3 Education Assessment Education Provided To: Patient Education Topics Provided Peripheral Neuropathy: Handouts: Neuropathy Methods: Demonstration, Explain/Verbal Responses: State content correctly Venous: Wound/Skin Impairment: Handouts: Caring for Your Ulcer Methods: Demonstration, Explain/Verbal Responses: State content correctly Electronic Signature(s) Signed: 05/17/2021 5:37:36 PM By: Gretta Cool, BSN, RN, CWS, Kim RN, BSN Entered By: Gretta Cool, BSN, RN, CWS, Kim on 05/17/2021 11:31:36 Ralph Peterson ([id number]) -------------------------------------------------------------------------------- Wound Assessment Details Patient Name: Ralph Peterson. Date of Service: 05/17/2021 10:45 AM Medical Record Number: [id number] Patient Account Number: [id number] Date of Birth/Sex: 1966/04/02 (55 y.o. M) Treating RN: Cornell Barman Primary Care Hridhaan Yohn: Vidal Schwalbe Other Clinician: Referring Eura Radabaugh: Vidal Schwalbe Treating Maxwell Lemen/Extender: Skipper Cliche in Treatment: 3 Wound Status Wound Number: 10 Primary Diabetic Wound/Ulcer of the Lower Extremity Etiology: Wound Location: Right,  Distal, Plantar Foot Wound Open Wounding Event: Blister Status: Date Acquired: 04/10/2021 Comorbid Arrhythmia, Congestive Heart Failure, Coronary Artery Weeks Of Treatment: 3 History: Disease, Hypertension, Myocardial Infarction, Peripheral Clustered Wound: No Venous Disease, Type II  Diabetes, Neuropathy Photos Wound Measurements Length: (cm) 1.8 % Width: (cm) 1.6 % Depth: (cm) 0.6 Ep Area: (cm) 2.262 U Volume: (cm) 1.357 Reduction in Area: -260.2% Reduction in Volume: -977% ithelialization: None ndermining: Yes Starting Position (o'clock): 10 Ending Position (o'clock): 7 Maximum Distance: (cm) 0.4 Wound Description Classification: Grade 2 Fo Wound Margin: Thickened Sl Exudate Amount: Medium Exudate Type: Serosanguineous Exudate Color: red, brown ul Odor After Cleansing: No ough/Fibrino Yes Wound Bed Granulation Amount: Large (67-100%) Exposed Structure Granulation Quality: Red, Pink Fascia Exposed: No Necrotic Amount: Small (1-33%) Fat Layer (Subcutaneous Tissue) Exposed: Yes Necrotic Quality: Adherent Slough Tendon Exposed: No Muscle Exposed: No Joint Exposed: No Bone Exposed: No Assessment Notes callus lip surrounding wound. Treatment Notes Wound #10 (Foot) Wound Laterality: Plantar, Right, Distal Peterson, Ralph S. ([id number]) Cleanser Peri-Wound Care Topical Primary Dressing Hydrofera Blue Ready Transfer Foam, 2.5x2.5 (in/in) Discharge Instruction: Apply Hydrofera Blue Ready to wound bed as directed Secondary Dressing Zetuvit Plus Silicone Border Dressing 5x5 (in/in) Secured With Compression Wrap Compression Stockings Environmental education officer) Signed: 05/17/2021 5:37:36 PM By: Gretta Cool, BSN, RN, CWS, Kim RN, BSN Entered By: Gretta Cool, BSN, RN, CWS, Kim on 05/17/2021 11:14:59 Ralph Peterson ([id number]) -------------------------------------------------------------------------------- Vitals Details Patient Name: Ralph Peterson Date of Service: 05/17/2021 10:45 AM Medical Record Number: [id number] Patient Account Number: [id number] Date of Birth/Sex: [birth year] (55 y.o. M) Treating RN: Cornell Barman Primary Care Donette Mainwaring: Vidal Schwalbe Other Clinician: Referring Davan Hark: Vidal Schwalbe Treating Janie Capp/Extender:  Skipper Cliche in Treatment: 3 Vital Signs Time Taken: 11:03 Temperature (F): 98.3 Height (in): 72 Pulse (bpm): 97 Weight (lbs): 219 Respiratory Rate (breaths/min): 16 Body Mass Index (BMI): 29.7 Blood Pressure (mmHg): 147/91 Reference Range: 80 - 120 mg / dl Electronic Signature(s) Signed: 05/17/2021 5:37:36 PM By: Gretta Cool, BSN, RN, CWS, Kim RN, BSN Entered By: Gretta Cool, BSN, RN, CWS, Kim on 05/17/2021 11:03:27

## 2021-05-22 ENCOUNTER — Encounter
Admission: RE | Disposition: A | Discharge status: Home / Self Care | Payer: Self-pay | Source: Ambulatory Visit | Attending: Vascular Surgery

## 2021-05-22 ENCOUNTER — Other Ambulatory Visit: Payer: Self-pay

## 2021-05-22 ENCOUNTER — Other Ambulatory Visit (INDEPENDENT_AMBULATORY_CARE_PROVIDER_SITE_OTHER): Payer: Self-pay | Admitting: Nurse Practitioner

## 2021-05-22 ENCOUNTER — Encounter: Payer: Self-pay | Admitting: Vascular Surgery

## 2021-05-22 ENCOUNTER — Ambulatory Visit
Admission: RE | Admit: 2021-05-22 | Discharge: 2021-05-22 | Disposition: A | Discharge status: Home / Self Care | Payer: Medicare PPO | Source: Ambulatory Visit | Attending: Vascular Surgery | Admitting: Vascular Surgery

## 2021-05-22 DIAGNOSIS — E1122 Type 2 diabetes mellitus with diabetic chronic kidney disease: Secondary | ICD-10-CM | POA: Insufficient documentation

## 2021-05-22 DIAGNOSIS — Z7901 Long term (current) use of anticoagulants: Secondary | ICD-10-CM | POA: Insufficient documentation

## 2021-05-22 DIAGNOSIS — Z7984 Long term (current) use of oral hypoglycemic drugs: Secondary | ICD-10-CM | POA: Diagnosis not present

## 2021-05-22 DIAGNOSIS — Z87891 Personal history of nicotine dependence: Secondary | ICD-10-CM | POA: Diagnosis not present

## 2021-05-22 DIAGNOSIS — I509 Heart failure, unspecified: Secondary | ICD-10-CM | POA: Diagnosis not present

## 2021-05-22 DIAGNOSIS — Z992 Dependence on renal dialysis: Secondary | ICD-10-CM | POA: Insufficient documentation

## 2021-05-22 DIAGNOSIS — Y841 Kidney dialysis as the cause of abnormal reaction of the patient, or of later complication, without mention of misadventure at the time of the procedure: Secondary | ICD-10-CM | POA: Insufficient documentation

## 2021-05-22 DIAGNOSIS — Z79899 Other long term (current) drug therapy: Secondary | ICD-10-CM | POA: Diagnosis not present

## 2021-05-22 DIAGNOSIS — T82858A Stenosis of vascular prosthetic devices, implants and grafts, initial encounter: Secondary | ICD-10-CM | POA: Diagnosis not present

## 2021-05-22 DIAGNOSIS — Z880 Allergy status to penicillin: Secondary | ICD-10-CM | POA: Diagnosis not present

## 2021-05-22 DIAGNOSIS — Z8616 Personal history of COVID-19: Secondary | ICD-10-CM | POA: Diagnosis not present

## 2021-05-22 DIAGNOSIS — Z885 Allergy status to narcotic agent status: Secondary | ICD-10-CM | POA: Insufficient documentation

## 2021-05-22 DIAGNOSIS — Z794 Long term (current) use of insulin: Secondary | ICD-10-CM | POA: Insufficient documentation

## 2021-05-22 DIAGNOSIS — N186 End stage renal disease: Secondary | ICD-10-CM | POA: Diagnosis not present

## 2021-05-22 DIAGNOSIS — I132 Hypertensive heart and chronic kidney disease with heart failure and with stage 5 chronic kidney disease, or end stage renal disease: Secondary | ICD-10-CM | POA: Diagnosis not present

## 2021-05-22 DIAGNOSIS — Y832 Surgical operation with anastomosis, bypass or graft as the cause of abnormal reaction of the patient, or of later complication, without mention of misadventure at the time of the procedure: Secondary | ICD-10-CM | POA: Diagnosis not present

## 2021-05-22 HISTORY — PX: A/V FISTULAGRAM: CATH118298

## 2021-05-22 LAB — POTASSIUM (ARMC VASCULAR LAB ONLY): Potassium (ARMC vascular lab): 5.3 — ABNORMAL HIGH (ref 3.5–5.1)

## 2021-05-22 LAB — GLUCOSE, CAPILLARY: Glucose-Capillary: 146 mg/dL — ABNORMAL HIGH (ref 70–99)

## 2021-05-22 SURGERY — A/V FISTULAGRAM
Anesthesia: Moderate Sedation | Laterality: Right

## 2021-05-22 MED ORDER — HEPARIN SODIUM (PORCINE) 1000 UNIT/ML IJ SOLN
INTRAMUSCULAR | Status: AC
  Filled 2021-05-22: qty 1

## 2021-05-22 MED ORDER — ONDANSETRON HCL 4 MG/2ML IJ SOLN: 4.0000 mg | Freq: Four times a day (QID) | INTRAMUSCULAR | Status: DC | PRN

## 2021-05-22 MED ORDER — FENTANYL CITRATE PF 50 MCG/ML IJ SOSY
PREFILLED_SYRINGE | INTRAMUSCULAR | Status: AC
  Filled 2021-05-22: qty 1

## 2021-05-22 MED ORDER — CLINDAMYCIN PHOSPHATE 300 MG/50ML IV SOLN: 300.0000 mg | Freq: Once | INTRAVENOUS | Status: AC

## 2021-05-22 MED ORDER — FENTANYL CITRATE (PF) 100 MCG/2ML IJ SOLN
INTRAMUSCULAR | Status: DC | PRN
  Administered 2021-05-22: 50 ug via INTRAVENOUS
  Administered 2021-05-22: 25 ug via INTRAVENOUS

## 2021-05-22 MED ORDER — HEPARIN SODIUM (PORCINE) 1000 UNIT/ML IJ SOLN
INTRAMUSCULAR | Status: DC | PRN
  Administered 2021-05-22: 4000 [IU] via INTRAVENOUS

## 2021-05-22 MED ORDER — FENTANYL CITRATE (PF) 100 MCG/2ML IJ SOLN: 12.5000 ug | Freq: Once | INTRAMUSCULAR | Status: DC | PRN

## 2021-05-22 MED ORDER — DIPHENHYDRAMINE HCL 50 MG/ML IJ SOLN: 50.0000 mg | Freq: Once | INTRAMUSCULAR | Status: DC | PRN

## 2021-05-22 MED ORDER — MIDAZOLAM HCL 2 MG/2ML IJ SOLN
INTRAMUSCULAR | Status: AC
  Filled 2021-05-22: qty 2

## 2021-05-22 MED ORDER — MIDAZOLAM HCL 2 MG/ML PO SYRP: 8.0000 mg | ORAL_SOLUTION | Freq: Once | ORAL | Status: DC | PRN

## 2021-05-22 MED ORDER — SODIUM CHLORIDE 0.9 % IV SOLN
INTRAVENOUS | Status: DC
  Administered 2021-05-22: 1000 mL via INTRAVENOUS

## 2021-05-22 MED ORDER — CLINDAMYCIN PHOSPHATE 300 MG/50ML IV SOLN
INTRAVENOUS | Status: AC
  Administered 2021-05-22: 300 mg via INTRAVENOUS
  Filled 2021-05-22: qty 50

## 2021-05-22 MED ORDER — METHYLPREDNISOLONE SODIUM SUCC 125 MG IJ SOLR: 125.0000 mg | Freq: Once | INTRAMUSCULAR | Status: DC | PRN

## 2021-05-22 MED ORDER — IODIXANOL 320 MG/ML IV SOLN
INTRAVENOUS | Status: DC | PRN
  Administered 2021-05-22: 60 mL via INTRA_ARTERIAL

## 2021-05-22 MED ORDER — FAMOTIDINE 20 MG PO TABS: 40.0000 mg | ORAL_TABLET | Freq: Once | ORAL | Status: DC | PRN

## 2021-05-22 MED ORDER — MIDAZOLAM HCL 2 MG/2ML IJ SOLN
INTRAMUSCULAR | Status: DC | PRN
  Administered 2021-05-22: 1 mg via INTRAVENOUS
  Administered 2021-05-22: 2 mg via INTRAVENOUS

## 2021-05-22 SURGICAL SUPPLY — 21 items
BALLN DORADO 7X40X80 (BALLOONS) ×2
BALLN LUTONIX 018 4X40X130 (BALLOONS) ×2
BALLOON DORADO 7X40X80 (BALLOONS) ×1 IMPLANT
BALLOON LUTONIX 018 4X40X130 (BALLOONS) ×1 IMPLANT
CATH BEACON 5 .035 40 KMP TP (CATHETERS) ×1 IMPLANT
CATH BEACON 5 .038 40 KMP TP (CATHETERS) ×1
DEVICE TORQUE .025-.038 (MISCELLANEOUS) ×2 IMPLANT
DRAPE BRACHIAL (DRAPES) ×2 IMPLANT
GUIDEWIRE ANGLED .035 180CM (WIRE) ×2 IMPLANT
KIT ENCORE 26 ADVANTAGE (KITS) ×2 IMPLANT
KIT MICROPUNCTURE NIT STIFF (SHEATH) ×2 IMPLANT
NEEDLE ENTRY 21GA 7CM ECHOTIP (NEEDLE) ×2 IMPLANT
PACK ANGIOGRAPHY (CUSTOM PROCEDURE TRAY) ×2 IMPLANT
SET INTRO CAPELLA COAXIAL (SET/KITS/TRAYS/PACK) ×2 IMPLANT
SHEATH BRITE TIP 6FRX5.5 (SHEATH) ×2 IMPLANT
SHEATH BRITE TIP 7FRX5.5 (SHEATH) ×2 IMPLANT
STENT VIABAHN 8X2.5X120 (Permanent Stent) ×1 IMPLANT
STENT VIABAHN 8X25X120 (Permanent Stent) ×1 IMPLANT
SUT MNCRL AB 4-0 PS2 18 (SUTURE) ×2 IMPLANT
TOWEL OR 17X26 4PK STRL BLUE (TOWEL DISPOSABLE) ×4 IMPLANT
WIRE G 018X200 V18 (WIRE) ×2 IMPLANT

## 2021-05-22 NOTE — Op Note (Signed)
OPERATIVE NOTE   PROCEDURE: Contrast injection right radiocephalic AV access Percutaneous transluminal angioplasty and stent placement with a 8 mm x 25 mm Viabahn stent peripheral segment. Ultrasound-guided access and sheath placement antegrade with a 5 French micro sheath. Ultrasound-guided access and sheath placement with a 7 French Pinnacle sheath retrograde  PRE-OPERATIVE DIAGNOSIS: Complication of dialysis access                                                       End Stage Renal Disease  POST-OPERATIVE DIAGNOSIS: same as above   SURGEON: Katha Cabal, M.D.  ANESTHESIA: Conscious sedation was administered under my direct supervision by the interventional radiology RN. IV Versed plus fentanyl were utilized. Continuous ECG, pulse oximetry and blood pressure was monitored throughout the entire procedure.  Conscious sedation was for a total of 54 minutes.  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 male who  presents with malfunctioning right arm AV access.  Duplex ultrasound obtained prior to the procedure suggests a stricture in the mid forearm.  The patient is scheduled for angiography with possible intervention of the AV access.  The patient is aware the risks include but are not limited to: bleeding, infection, thrombosis of the cannulated access, and possible anaphylactic reaction to the contrast.  The patient acknowledges if the access can not be salvaged a tunneled catheter will be needed and will be placed during this procedure.  The patient is aware of the risks of the procedure and elects to proceed with the angiogram and intervention.  DESCRIPTION: After full informed written consent was obtained, the patient was brought back to the Special Procedure suite and placed supine position.   Appropriate cardiopulmonary monitors were placed.  The right arm was prepped and draped in the standard fashion.  Appropriate timeout is called. The right radiocephalic was cannulated with a micropuncture needle in the antegrade direction in the region of the distal forearm.  Cannulation was performed with ultrasound guidance. Ultrasound was placed in a sterile sleeve, the AV access was interrogated and noted to be echolucent and compressible indicating patency. Image was recorded for the permanent record. The puncture is performed under continuous ultrasound visualization.   The microwire was advanced and the needle was exchanged for  a microsheath.  The J-wire was then advanced and a 6 Fr sheath inserted.  Hand injections were completed to image the access from the arterial anastomosis through the entire access.  The central venous structures were also imaged by hand injections.  Diagnostic interpretation: The fistula is imaged with contrast in the antegrade direction.  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  There is some mild narrowing between these aneurysmal segments but this is not flow-limiting.  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  More proximally the outflow is widely patent through the antecubital fossa and the upper arm.  The axillary subclavian and innominate vein and superior vena cava are all widely patent.  With compression of the aneurysm proximal to the sheath  I refluxed contrast into the radial artery it was at this point that the greater than 80% stenosis within the cephalic vein approximately 8 to 10 mm above the arterial anastomosis is identified.  This most certainly explains the low flow problems that they have been having.  Based on the images,  3000 units of heparin was given.  Because the ultrasound suggested a more  proximal lesion in the mid forearm the initial sheath is not adequate for treating the identified lesion and the ultrasound was returned to the field.  The fistula was imaged in the proximal forearm it was again noted to be echolucent and compressible indicating patency.  1% lidocaine is infiltrated as skin and soft tissues.  Images recorded for the permanent record.  Access to the fistula is obtained under direct ultrasound visualization in the retrograde direction.  Microwire followed micro sheath J-wire followed by a 7 French Pinnacle sheath is then inserted.  Glide wire and Kumpe catheter was negotiated through the strictures within the peripheral portion of the access.  The catheter was then positioned in the radial artery and several injections of contrast were performed so that I could identify the best oblique angle for intervention.  Once the lesion had been well-characterized a V 18 wire was introduced through the Kumpe catheter and positioned in the distal hand.  A 4 mm x 40 mm Lutonix drug-eluting balloon was used.  Inflation was to 12 atm for approximately 1 minute.  Follow-up imaging demonstrated greater than 60% residual stenosis and I elected to place an 8 mm x 25 mm Viabahn stent which was deployed across this lesion without difficulty.  Was then postdilated with a 7 mm x 40 mm Dorado balloon inflated to 16 atm for approximately 30 seconds.  The balloon was removed and the Kumpe catheter reintroduced.  Follow-up imaging demonstrates complete resolution of the stricture with rapid flow of contrast through the fistula, the central venous anatomy is preserved.  A 4-0 Monocryl purse-string suture was sewn around both of the sheaths.  The sheaths were removed and light pressure was applied.  A sterile bandage was applied to the puncture site.    COMPLICATIONS: None  CONDITION: Ralph Peterson, M.D [name] Office: [phone]  05/22/2021 12:01 PM

## 2021-05-22 NOTE — Interval H&P Note (Signed)
History and Physical Interval Note:  05/22/2021 10:44 AM  Ralph Peterson  has presented today for surgery, with the diagnosis of RT arm fistulagram  End Stage Renal.  The various methods of treatment have been discussed with the patient and family. After consideration of risks, benefits and other options for treatment, the patient has consented to  Procedure(s): A/V FISTULAGRAM (Right) as a surgical intervention.  The patient's history has been reviewed, patient examined, no change in status, stable for surgery.  I have reviewed the patient's chart and labs.  Questions were answered to the patient's satisfaction.     Hortencia Pilar

## 2021-05-24 ENCOUNTER — Encounter: Payer: Medicare PPO | Attending: Physician Assistant | Admitting: Physician Assistant

## 2021-05-24 ENCOUNTER — Other Ambulatory Visit: Payer: Self-pay

## 2021-05-24 DIAGNOSIS — M86671 Other chronic osteomyelitis, right ankle and foot: Secondary | ICD-10-CM | POA: Diagnosis not present

## 2021-05-24 DIAGNOSIS — I5042 Chronic combined systolic (congestive) and diastolic (congestive) heart failure: Secondary | ICD-10-CM | POA: Insufficient documentation

## 2021-05-24 DIAGNOSIS — Z7901 Long term (current) use of anticoagulants: Secondary | ICD-10-CM | POA: Diagnosis not present

## 2021-05-24 DIAGNOSIS — I480 Paroxysmal atrial fibrillation: Secondary | ICD-10-CM | POA: Insufficient documentation

## 2021-05-24 DIAGNOSIS — Z992 Dependence on renal dialysis: Secondary | ICD-10-CM | POA: Diagnosis not present

## 2021-05-24 DIAGNOSIS — N186 End stage renal disease: Secondary | ICD-10-CM | POA: Diagnosis not present

## 2021-05-24 DIAGNOSIS — E11621 Type 2 diabetes mellitus with foot ulcer: Secondary | ICD-10-CM | POA: Insufficient documentation

## 2021-05-24 DIAGNOSIS — L97512 Non-pressure chronic ulcer of other part of right foot with fat layer exposed: Secondary | ICD-10-CM | POA: Insufficient documentation

## 2021-05-24 NOTE — Progress Notes (Addendum)
PI[place], [place] ([id number]) Visit Report for 05/24/2021 Chief Complaint Document Details Patient Name: Ralph Peterson, Ralph Peterson. Date of Service: 05/24/2021 2:00 PM Medical Record Number: [id number] Patient Account Number: [id number] Date of Birth/Sex: 03/24/1966 (55 y.o. 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 M) Treating RN: Cornell Barman Primary Care Provider: Vidal Schwalbe Other Clinician: Referring Provider: Vidal Schwalbe Treating Provider/Extender: Skipper Cliche in Treatment: 4 Debridement Performed for Wound #10 Right,Distal,Plantar Foot Assessment: Performed By: Physician Tommie Sams., PA-C Debridement Type: Debridement Severity of Tissue Pre Debridement: Fat layer exposed Level of Consciousness (Pre- Awake and Alert procedure): Pre-procedure Verification/Time Out Yes - 14:41 Taken: Total Area Debrided (L x W): 1.7 (cm) x 2 (cm) = 3.4 (cm) Tissue and other material Viable, Non-Viable, Slough, Subcutaneous, Slough debrided: Level: Skin/Subcutaneous Tissue Debridement Description: Excisional Instrument: Curette Bleeding: Minimum Hemostasis Achieved: Pressure Response to Treatment: Procedure was tolerated well Level of Consciousness (Post- Awake and  Alert procedure): Post Debridement Measurements of Total Wound Length: (cm) 1.7 Width: (cm) 2 Depth: (cm) 0.8 Volume: (cm) 2.136 Character of Wound/Ulcer Post Debridement: Stable Severity of Tissue Post Debridement: Fat layer exposed Post Procedure Diagnosis Same as Pre-procedure Electronic Signature(s) Signed: 05/24/2021 5:06:33 PM By: Worthy Keeler PA-C Signed: 05/25/2021 12:18:06 PM By: Gretta Cool, BSN, RN, CWS, Kim RN, BSN Entered By: Gretta Cool, BSN, RN, CWS, Kim on 05/24/2021 14:42:57 Ralph Peterson ([id number]) -------------------------------------------------------------------------------- HPI Details Patient Name: Ralph Peterson. Date of Service: 05/24/2021 2:00 PM Medical Record Number: [id number] Patient Account Number: [id number] Date of Birth/Sex: 1966/02/02 (55 y.o. M) Treating RN: Cornell Barman Primary Care Provider: Vidal Schwalbe Other Clinician: Referring Provider: Vidal Schwalbe Treating Provider/Extender: Skipper Cliche in Treatment: 4 History of Present Illness HPI Description: 08/17/2020 upon evaluation today patient actually appears to be doing decently well today which is good news in regard to the dorsal wound which is almost healed and the lateral wound also doing much better. With regard to the plantar wound this is still draining purulent drainage unfortunately the culture did reveal that he had Pseudomonas as well as Enterococcus. With that being said he is allergic to penicillin which took away the only oral medication for the Enterococcus I did look at linezolid as a possibility but unfortunately there were medication interactions. Also the same was true the Cipro where there were medication interactions. Nonetheless I want to see if we can get him into infectious disease to see if they can work with him to try to find possibly add at home IV antibiotic regimen that can help out at this point. No sharp debridement is can be necessary today. 09/07/2020  upon evaluation today patient appears to be doing unfortunately not too well in regard to his wound. He has been tolerating the dressing changes without complication. Fortunately there is no sign of active infection at this time. No fevers, chills, nausea, vomiting, or diarrhea. The patient did have a repeat surgery going to clean out the abscess and try to work on getting things  moving in a better direction about a week and a half ago. He still has sutures in place. With that being said unfortunately he does not appear to be showing signs of a lot of improvement in my opinion. He does have sutures on the plantar aspect still in place and on the dorsal aspect he has a small opening which actually probes all the way down through and actually came out of the plantar aspect. I feel like that he really is having a lot of issues here and I am afraid that if we do not get this taken care of this is can end up with him having a much larger and more extensive amputation than what he was hoping to have to deal with with this. It has already been since June 2021 but has been dealing with this including all the time since the amputation. 09/14/2020 upon evaluation today patient appears to be doing really about the same in regard to the wound on the dorsal surface of his foot. The plantar foot unfortunately still has sutures in place I really cannot see how things are doing in that regard. With the dorsal foot however this appears to show signs of still having a small opening in the distal aspect which does actually probe down to bone there is also significant purulent drainage we did actually obtain a wound culture today to further evaluate the issue here. 09/21/2020 upon evaluation today patient appears to be doing about the same in regard to his wound on the dorsal surface of his foot and the plantar foot he still does not have the sutures out at this point. That should hopefully happen in the next week. With that  being said the main issue is he continues to have drainage here. It was noted that he had positive findings on culture for Enterobacter. With that being said he cannot take oral penicillin he could do vancomycin and I think that is good to be the ideal thing this to see if we can get him started on IV vancomycin at dialysis. We will get a get in touch with them to try to see if we can coordinate that being done there at the dialysis center. I think that is the ideal situation at this point I would probably start with just 2 weeks of therapy. 09/28/2020 upon evaluation today patient appears to be doing well with regard to his wounds in general. He also has back on the vancomycin after the culture results and this seems to be doing excellent for him. All the wounds are showing signs of good improvement. No fevers, chills, nausea, vomiting, or diarrhea. 10/05/2020 upon evaluation today patient appears to actually be doing excellent in regard to his foot. Fortunately there is a lot of signs of new granulation epithelization at this point. There does not appear to be any evidence of active infection which is great news and overall very pleased with where things stand. In fact the open wound along the plantar aspect of his foot as well as the dorsal part of his foot has dramatically improved since we went forward with the vancomycin and requested that the dialysis center provide this to him by way of IV antibiotics with his dialysis. Overall I think that has made a dramatic improvement and overall his wound seems to be doing significantly better at this point. No fevers, chills, nausea, vomiting, or diarrhea. The patient was set to have his second opinion appointment with podiatry [facility] next week but to  be perfectly honest I am not even certain that that is necessary at this point I do not think there is much that they would be able to do for him nor that he really would want them to do considering how well  things look at this time. 10/12/2020 on evaluation today patient appears to be doing excellent in regard to his wounds. In fact the only issue I see is that everything is dried out as far as the collagen is concerned. I do think we can try to keep this moist he will continue to make excellent progress as he has been. Overall I am extremely pleased with what I see today. 10/19/2020 upon evaluation today patient appears to be doing decently well in regard to the wounds on his foot he is can require some debridement in this location. Unfortunately is having some issues with blistering over the leg which is of greater concern at this point. Fortunately I do not see any signs of active infection which is great news. Nonetheless the blistering may just be related to venous insufficiency or subsequently could potentially be an issue with overall worsening with regard to an infection but again it is not really hot to touch. 10/26/2020 upon evaluation today patient appears to be doing well with regard to the majority of his wounds. Unfortunately the dorsal surface of his foot does not appear to be doing quite as well today compared to what it has been doing. I do think that we will need to address that currently. Everything else is showing signs of improvement. He did not get the prescription that I gave him previous as unfortunately the transmission failed. I did not know that until today when the patient mention to me that he never got the antibiotic. 11/02/20 upon evaluation today patient appears to be doing much better in regard to his wounds in general. I feel like that he is making good progress and even the top of the foot does not appear to be doing too poorly at this time. He did finally get the antibiotics which is good. Unfortunately it does not look like I am able to electronically sent to his pharmacy it kept being an error in transmission every time this was sent. We finally decided to call it in.  Nonetheless overall I think he is headed in the right direction based on what I am seeing. 11/09/2020 upon evaluation today patient appears to be doing well in general in regard to his wounds. I think that he is making some progress here. With that being said there is definitely still some areas of concern. Specifically the dorsal aspect of his foot distally and the lateral aspect of his foot proximally. Both of these areas are still open everything else appears to likely be closed today based on what I am seeing. 4/28; the patient really looks quite good. The area over the base of the right fifth metatarsal and the base of the fifth MTP both look healed to me. 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 They are using Hydrofera Blue rope but I think this is far too small wound now for this 11/23/2020 upon evaluation today patient actually appears to be doing excellent. In fact I do not really see anything that is actually open anywhere at this point. With that being said I am a little reluctant to completely just healing out  and discharge him based on the dorsal foot wound being closed even though I do not see anything open we have been there down this road before where things would reopen causing additional problems. Nonetheless I think that we need to monitor him a little bit more closely to make sure nothing worsens again. 11/30/2020 upon evaluation today patient actually appears to be doing quite well in regard to his wound at this point. He has been tolerating the dressing changes without complication which is great news and overall I am extremely pleased with where things stand today. No fevers, chills, nausea, vomiting, or diarrhea. Readmission: 04/24/2021 upon evaluation today patient presents for reevaluation here in the clinic this is a patient that I have previously taken care of  when he had surgical wounds that had to be healed and this was several months back he in fact healed I believe in May 2022. Subsequently he has an issue now with his foot as he has been walking around more and this has caused an abnormal pressure location. This again is on the foot where he had all the surgery previous. That is on the right plantar foot. With that being said unfortunately the pati having a significant issue here with pressure ent does seem to be which I think is not can be the easiest thing to manage due to the shape of his foot. He is supposed to be getting new diabetic shoes fitted but right now he still has the ones currently that he has been you been using previously. Otherwise none of his major medical problems have shifted at this point. 10/11; second visit for this man who has a area right plantar foot at roughly the fourth metatarsal head. We have been using Hydrofera Blue and a surgical shoe. 05/08/2021 upon evaluation today patient's wound is actually showing signs of being a little bit larger he has a lot of callus around the edges of the wound that is can have to be cleaned away this is some undermining region. Nonetheless I think if we get that cleared away we can actually go and see about put him in the total contact cast with Dr. Dellia Nims discussed with him last week. 05/10/2021 upon evaluation today patient's wound actually appears to be doing great in fact and just the 2 days since we put the cast on I think he has made even some good progress here. Nonetheless he is here for the obligatory first cast change 2 days after application. No sharp debridement necessary today. 05/17/2021 upon evaluation today patient appears to be doing well with regard to his plantar foot ulcer. I do feel like that there is much less in the way of breakdown and deterioration today as compared to his last visit. And that is even better than the time before which was when we first put the  cast on. Either way I think that we are headed in the right direction though there is bone in the base of the wound just underneath some tissue were not completely exposed but this is something that we need to keep a close eye on. I am concerned about how things are progressing in 1 to make sure that we keep as much pressure off as possible I think the total contact cast is good to be perfect for [place] to continue. 05/24/2021 upon evaluation today patient unfortunately appears to be doing a little bit worse in regard to the plantar foot ulcer it looks better around the edges which is good  the cast definitely was keeping pressure off. Unfortunately the issue is simply that the patient is having more trouble in the center part of the wound diagnosis last week and I was little concerned and then honestly I am not really certain that keeping the cast on is the best way to go. I do think he needs some sharp debridement today. Electronic Signature(s) Signed: 05/24/2021 2:48:11 PM By: Worthy Keeler PA-C Entered By: Worthy Keeler on 05/24/2021 14:48:11 Ralph Peterson ([id number]) -------------------------------------------------------------------------------- Physical Exam Details Patient Name: Ralph Peterson Date of Service: 05/24/2021 2:00 PM Medical Record Number: [id number] Patient Account Number: [id number] Date of Birth/Sex: 1966/05/15 (55 y.o. M) Treating RN: Cornell Barman Primary Care Provider: Vidal Schwalbe Other Clinician: Referring Provider: Vidal Schwalbe Treating Provider/Extender: Skipper Cliche in Treatment: 4 Constitutional Well-nourished and well-hydrated in no acute distress. Respiratory normal breathing without difficulty. Psychiatric this patient is able to make decisions and demonstrates good insight into disease process. Alert and Oriented x 3. pleasant and cooperative. Notes Upon inspection patient's wound bed actually showed signs of some good granulation  epithelization around the edges of the wound unfortunately there are some necrotic tissue in the center part of the wound in general I am thinking that the cath may not be the best way to go at the moment I think we need to try to have something that can be changed a little bit more frequently at this point. Electronic Signature(s) Signed: 05/24/2021 2:48:40 PM By: Worthy Keeler PA-C Entered By: Worthy Keeler on 05/24/2021 14:48:39 Ralph Peterson ([id number]) -------------------------------------------------------------------------------- Physician Orders Details Patient Name: Ralph Peterson Date of Service: 05/24/2021 2:00 PM Medical Record Number: [id number] Patient Account Number: [id number] Date of Birth/Sex: [birth year] (55 y.o. M) Treating RN: Cornell Barman Primary Care Provider: Vidal Schwalbe Other Clinician: Referring Provider: Vidal Schwalbe Treating Provider/Extender: Skipper Cliche in Treatment: 4 Verbal / Phone Orders: No Diagnosis Coding ICD-10 Coding Code Description E11.621 Type 2 diabetes mellitus with foot ulcer 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d I48.0 Paroxysmal atrial fibrillation Z79.01 Long term (current) use of anticoagulants I50.42 Chronic combined systolic (congestive) and diastolic (congestive) heart failure 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 Bathing/ Shower/ Hygiene o May shower; gently cleanse wound with antibacterial soap, rinse and pat dry prior to dressing wounds o No tub bath. Off-Loading o Open toe surgical shoe with peg assist. o Other: - offload insert from [facility] Wound Treatment Wound #10 - Foot Wound Laterality: Plantar, Right, Distal Primary Dressing: Hydrofera Blue Ready Transfer Foam, 2.5x2.5 (in/in) (Generic) Every Other Day/15 Days Discharge Instructions: Apply Hydrofera  Blue Ready to wound bed as directed Secondary Dressing: Zetuvit Plus Silicone Border Dressing 5x5 (in/in) Every Other Day/15 Days Radiology o MRI, lower extremity without contrast - Right Foot-wound on plantar surface Electronic Signature(s) Signed: 05/24/2021 5:06:33 PM By: Worthy Keeler PA-C Signed: 05/25/2021 12:18:06 PM By: Gretta Cool, BSN, RN, CWS, Kim RN, BSN Entered By: Gretta Cool, BSN, RN, CWS, Kim on 05/24/2021 15:05:41 Ralph Peterson ([id number]) -------------------------------------------------------------------------------- Problem List Details Patient Name: Ralph Peterson. Date of Service: 05/24/2021 2:00 PM Medical Record Number: [id number] Patient Account Number: [id number] Date of Birth/Sex: 14-Mar-1966 (55 y.o. M) Treating RN: Cornell Barman Primary Care Provider: Vidal Schwalbe Other Clinician: Referring Provider: Vidal Schwalbe Treating Provider/Extender: Skipper Cliche in Treatment: 4 Active Problems ICD-10 Encounter Code Description Active Date MDM Diagnosis  W46.659 Type 2 diabetes mellitus with foot ulcer 04/24/2021 No Yes L97.512 Non-pressure chronic ulcer of other part of right foot with fat layer 04/24/2021 No Yes exposed I48.0 Paroxysmal atrial fibrillation 04/24/2021 No Yes Z79.01 Long term (current) use of anticoagulants 04/24/2021 No Yes I50.42 Chronic combined systolic (congestive) and diastolic (congestive) heart 04/24/2021 No Yes failure N18.6 End stage renal disease 04/24/2021 No Yes Z99.2 Dependence on renal dialysis 04/24/2021 No Yes Inactive Problems Resolved Problems Electronic Signature(s) Signed: 05/24/2021 2:21:53 PM By: Worthy Keeler PA-C Entered By: Worthy Keeler on 05/24/2021 14:21:53 Ralph Peterson ([id number]) -------------------------------------------------------------------------------- Progress Note Details Patient Name: Ralph Peterson Date of Service: 05/24/2021 2:00 PM Medical Record Number: [id number] Patient  Account Number: [id number] Date of Birth/Sex: Apr 28, 1966 (55 y.o. 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 With regard to the plantar wound this is still draining purulent drainage unfortunately the culture did reveal that he had Pseudomonas as well as Enterococcus. With that being said he is allergic to penicillin which took away the only oral medication for the Enterococcus I did look at linezolid as a possibility but unfortunately there were medication interactions. Also the same was true the Cipro where there were medication interactions. Nonetheless I want to see if we can get him into infectious disease to see if they can work with him to try to find possibly add at home IV antibiotic regimen that can help out at this point. No sharp debridement is can be necessary today. 09/07/2020 upon evaluation today patient appears to be doing unfortunately not too well in regard to his wound. He has been tolerating the dressing changes without complication. Fortunately there is no sign of active infection at this time. No fevers, chills, nausea, vomiting, or diarrhea. The patient did have a repeat surgery going to clean out the abscess and try to work on getting things moving in a better direction about a week and a half ago. He still has sutures in place. With that being said unfortunately he does not appear to be showing signs of a lot of improvement in my opinion. He does have sutures  on the plantar aspect still in place and on the dorsal aspect he has a small opening which actually probes all the way down through and actually came out of the plantar aspect. I feel like that he really is having a lot of issues here and I am afraid that if we do not get this taken care of this is can end up with him having a much larger and more extensive amputation than what he was hoping to have to deal with with this. It has already been since June 2021 but has been dealing with this including all the time since the amputation. 09/14/2020 upon evaluation today patient appears to be doing really about the same in regard to the wound on the dorsal surface of his foot. The plantar foot unfortunately still has sutures in place I really cannot see how things are doing in that regard. With the dorsal foot however this appears  to show signs of still having a small opening in the distal aspect which does actually probe down to bone there is also significant purulent drainage we did actually obtain a wound culture today to further evaluate the issue here. 09/21/2020 upon evaluation today patient appears to be doing about the same in regard to his wound on the dorsal surface of his foot and the plantar foot he still does not have the sutures out at this point. That should hopefully happen in the next week. With that being said the main issue is he continues to have drainage here. It was noted that he had positive findings on culture for Enterobacter. With that being said he cannot take oral penicillin he could do vancomycin and I think that is good to be the ideal thing this to see if we can get him started on IV vancomycin at dialysis. We will get a get in touch with them to try to see if we can coordinate that being done there at the dialysis center. I think that is the ideal situation at this point I would probably start with just 2 weeks of therapy. 09/28/2020 upon evaluation today patient appears to be  doing well with regard to his wounds in general. He also has back on the vancomycin after the culture results and this seems to be doing excellent for him. All the wounds are showing signs of good improvement. No fevers, chills, nausea, vomiting, or diarrhea. 10/05/2020 upon evaluation today patient appears to actually be doing excellent in regard to his foot. Fortunately there is a lot of signs of new granulation epithelization at this point. There does not appear to be any evidence of active infection which is great news and overall very pleased with where things stand. In fact the open wound along the plantar aspect of his foot as well as the dorsal part of his foot has dramatically improved since we went forward with the vancomycin and requested that the dialysis center provide this to him by way of IV antibiotics with his dialysis. Overall I think that has made a dramatic improvement and overall his wound seems to be doing significantly better at this point. No fevers, chills, nausea, vomiting, or diarrhea. The patient was set to have his second opinion appointment with podiatry [facility] next week but to be perfectly honest I am not even certain that that is necessary at this point I do not think there is much that they would be able to do for him nor that he really would want them to do considering how well things look at this time. 10/12/2020 on evaluation today patient appears to be doing excellent in regard to his wounds. In fact the only issue I see is that everything is dried out as far as the collagen is concerned. I do think we can try to keep this moist he will continue to make excellent progress as he has been. Overall I am extremely pleased with what I see today. 10/19/2020 upon evaluation today patient appears to be doing decently well in regard to the wounds on his foot he is can require some debridement in this location. Unfortunately is having some issues with blistering over the leg which  is of greater concern at this point. Fortunately I do not see any signs of active infection which is great news. Nonetheless the blistering may just be related to venous insufficiency or subsequently could potentially be an issue with overall worsening with regard to an infection but again  it is not really hot to touch. 10/26/2020 upon evaluation today patient appears to be doing well with regard to the majority of his wounds. Unfortunately the dorsal surface of his foot does not appear to be doing quite as well today compared to what it has been doing. I do think that we will need to address that currently. Everything else is showing signs of improvement. He did not get the prescription that I gave him previous as unfortunately the transmission failed. I did not know that until today when the patient mention to me that he never got the antibiotic. 11/02/20 upon evaluation today patient appears to be doing much better in regard to his wounds in general. I feel like that he is making good progress and even the top of the foot does not appear to be doing too poorly at this time. He did finally get the antibiotics which is good. Unfortunately it does not look like I am able to electronically sent to his pharmacy it kept being an error in transmission every time this was sent. We finally decided to call it in. Nonetheless overall I think he is headed in the right direction based on what I am seeing. VIYAAN, CHAMPINE ([id number]) 11/09/2020 upon evaluation today patient appears to be doing well in general in regard to his wounds. I think that he is making some progress here. With that being said there is definitely still some areas of concern. Specifically the dorsal aspect of his foot distally and the lateral aspect of his foot proximally. Both of these areas are still open everything else appears to likely be closed today based on what I am seeing. 4/28; the patient really looks quite good. The area  over the base of the right fifth metatarsal and the base of the fifth MTP both look healed to me. 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 They are using Hydrofera Blue rope but I think this is far too small wound now for this 11/23/2020 upon evaluation today patient actually appears to be doing excellent. In fact I do not really see anything that is actually open anywhere at this point. With that being said I am a little reluctant to completely just healing out and discharge him based on the dorsal foot wound being closed even though I do not see anything open we have been there down this road before where things would reopen causing additional problems. Nonetheless I think that we need to monitor him a little bit more closely to make sure nothing worsens again. 11/30/2020 upon evaluation today patient actually appears to be doing quite well in regard to his wound at this point. He has been tolerating the dressing changes without complication which is great news and overall I am extremely pleased with where things stand today. No fevers, chills, nausea, vomiting, or diarrhea. Readmission: 04/24/2021 upon evaluation today patient presents for reevaluation here in the clinic this is a patient that I have previously taken care of when he had surgical wounds that had to be healed and this was several months back he in fact healed I believe in May 2022. Subsequently he has an issue now with his foot as he has been walking around more and this has caused an abnormal pressure location. This again is on the foot where he had all the surgery previous. That is on the right plantar foot. With that being said  unfortunately the pati having a significant issue here with pressure ent does seem to be which I think is not can be the easiest thing to manage due to the shape of his foot. He is supposed to be getting new diabetic shoes fitted  but right now he still has the ones currently that he has been you been using previously. Otherwise none of his major medical problems have shifted at this point. 10/11; second visit for this man who has a area right plantar foot at roughly the fourth metatarsal head. We have been using Hydrofera Blue and a surgical shoe. 05/08/2021 upon evaluation today patient's wound is actually showing signs of being a little bit larger he has a lot of callus around the edges of the wound that is can have to be cleaned away this is some undermining region. Nonetheless I think if we get that cleared away we can actually go and see about put him in the total contact cast with Dr. Dellia Nims discussed with him last week. 05/10/2021 upon evaluation today patient's wound actually appears to be doing great in fact and just the 2 days since we put the cast on I think he has made even some good progress here. Nonetheless he is here for the obligatory first cast change 2 days after application. No sharp debridement necessary today. 05/17/2021 upon evaluation today patient appears to be doing well with regard to his plantar foot ulcer. I do feel like that there is much less in the way of breakdown and deterioration today as compared to his last visit. And that is even better than the time before which was when we first put the cast on. Either way I think that we are headed in the right direction though there is bone in the base of the wound just underneath some tissue were not completely exposed but this is something that we need to keep a close eye on. I am concerned about how things are progressing in 1 to make sure that we keep as much pressure off as possible I think the total contact cast is good to be perfect for [place] to continue. 05/24/2021 upon evaluation today patient unfortunately appears to be doing a little bit worse in regard to the plantar foot ulcer it looks better around the edges which is good the cast definitely  was keeping pressure off. Unfortunately the issue is simply that the patient is having more trouble in the center part of the wound diagnosis last week and I was little concerned and then honestly I am not really certain that keeping the cast on is the best way to go. I do think he needs some sharp debridement today. Objective Constitutional Well-nourished and well-hydrated in no acute distress. Vitals Time Taken: 2:20 PM, Height: 72 in, Weight: 219 lbs, BMI: 29.7, Temperature: 98.7 F, Pulse: 96 bpm, Respiratory Rate: 16 breaths/min, Blood Pressure: 128/79 mmHg. Respiratory normal breathing without difficulty. Psychiatric this patient is able to make decisions and demonstrates good insight into disease process. Alert and Oriented x 3. pleasant and cooperative. General Notes: Upon inspection patient's wound bed actually showed signs of some good granulation epithelization around the edges of the wound unfortunately there are some necrotic tissue in the center part of the wound in general I am thinking that the cath may not be the best way to go at the moment I think we need to try to have something that can be changed a little bit more frequently at this point. Integumentary (Hair,  Skin) DWAIN, HUHN S. ([id number]) Wound #10 status is Open. Original cause of wound was Blister. The date acquired was: 04/10/2021. The wound has been in treatment 4 weeks. The wound is located on the [place]. The wound measures 1.7cm length x 2cm width x 0.6cm depth; 2.67cm^2 area and 1.602cm^3 volume. There is Fat Layer (Subcutaneous Tissue) exposed. There is no tunneling or undermining noted. There is a medium amount of serosanguineous drainage noted. The wound margin is thickened. There is no granulation within the wound bed. There is a large (67-100%) amount of necrotic tissue within the wound bed including Adherent Slough. Assessment Active Problems ICD-10 Type 2 diabetes mellitus  with foot ulcer Non-pressure chronic ulcer of other part of right foot with fat layer exposed Paroxysmal atrial fibrillation Long term (current) use of anticoagulants Chronic combined systolic (congestive) and diastolic (congestive) heart failure End stage renal disease Dependence on renal dialysis Procedures Wound #10 Pre-procedure diagnosis of Wound #10 is a Diabetic Wound/Ulcer of the Lower Extremity located on the Right,Distal,Plantar Foot .Severity of Tissue Pre Debridement is: Fat layer exposed. There was a Excisional Skin/Subcutaneous Tissue Debridement with a total area of 3.4 sq cm performed by Tommie Sams., PA-C. With the following instrument(s): Curette to remove Viable and Non-Viable tissue/material. Material removed includes Subcutaneous Tissue and Slough and. No specimens were taken. A time out was conducted at 14:41, prior to the start of the procedure. A Minimum amount of bleeding was controlled with Pressure. The procedure was tolerated well. Post Debridement Measurements: 1.7cm length x 2cm width x 0.8cm depth; 2.136cm^3 volume. Character of Wound/Ulcer Post Debridement is stable. Severity of Tissue Post Debridement is: Fat layer exposed. Post procedure Diagnosis Wound #10: Same as Pre-Procedure Plan Follow-up Appointments: Return Appointment in 1 week. Bathing/ Shower/ Hygiene: May shower with wound dressing protected with water repellent cover or cast protector. - Do not get cast wet. If it gets wet call us immediately. If on weekend, go to urgent care or Emergency department for removal. No tub bath. Off-Loading: Open toe surgical shoe with peg assist. Other: - offload insert from [facility] Radiology ordered were: MRI, lower extremity without contrast - Right Foot-wound on plantar surface WOUND #10: - Foot Wound Laterality: Plantar, Right, Distal Primary Dressing: Hydrofera Blue Ready Transfer Foam, 2.5x2.5 (in/in) (Generic) Every Other Day/15 Days Discharge  Instructions: Apply Hydrofera Blue Ready to wound bed as directed Secondary Dressing: Zetuvit Plus Silicone Border Dressing 5x5 (in/in) Every Other Day/15 Days 1. Would recommend currently that we going continue with wound care measures as before and the patient is in agreement with the plan. This includes the use of the [facility] dressing which were still getting give this a shot for the time being. 2. Also can recommend that we have the patient continue with the border foam dressing to cover. 3. I would also suggest that the patient continue with the offloading shoe we will get a get him set up with a peg assist offloading shoe today. 4. I am also can recommend that we go ahead and get him set up for an MRI without contrast of the foot in order to evaluate for any evidence of osteomyelitis in 1 to make sure considering more so close of the bone but there is not a significant issue with infection here that is causing part of our problem today. We will see patient back for reevaluation in 1 week here in the clinic. If anything worsens or changes patient will contact our office for  additional recommendations. TRAYE, BATES ([id number]) Electronic Signature(s) Signed: 05/24/2021 2:49:34 PM By: Worthy Keeler PA-C Entered By: Worthy Keeler on 05/24/2021 14:49:33 Ralph Peterson ([id number]) -------------------------------------------------------------------------------- SuperBill Details Patient Name: Ralph Peterson Date of Service: 05/24/2021 Medical Record Number: [id number] Patient Account Number: [id number] Date of Birth/Sex: 1966/02/29 (56 y.o. M) Treating RN: Cornell Barman Primary Care Provider: Vidal Schwalbe Other Clinician: Referring Provider: Vidal Schwalbe Treating Provider/Extender: Skipper Cliche in Treatment: 4 Diagnosis Coding ICD-10 Codes Code Description [phone] Type 2 diabetes mellitus with foot ulcer L97.512 Non-pressure chronic ulcer of other  part of right foot with fat layer exposed I48.0 Paroxysmal atrial fibrillation Z79.01 Long term (current) use of anticoagulants I50.42 Chronic combined systolic (congestive) and diastolic (congestive) heart failure N18.6 End stage renal disease Z99.2 Dependence on renal dialysis Facility Procedures CPT4 Code: [id number] Description: 16073 - DEB SUBQ TISSUE 20 SQ CM/< Modifier: Quantity: 1 CPT4 Code: Description: ICD-10 Diagnosis Description L97.512 Non-pressure chronic ulcer of other part of right foot with fat layer ex Modifier: posed Quantity: Physician Procedures CPT4 Code: 7106269 Description: 99214 - WC PHYS LEVEL 4 - EST PT Modifier: 25 Quantity: 1 CPT4 Code: Description: ICD-10 Diagnosis Description E11.621 Type 2 diabetes mellitus with foot ulcer L97.512 Non-pressure chronic ulcer of other part of right foot with fat layer ex I48.0 Paroxysmal atrial fibrillation Z79.01 Long term (current) use of  anticoagulants Modifier: posed Quantity: CPT4 Code: [id number] Description: 11042 - WC PHYS SUBQ TISS 20 SQ CM Modifier: Quantity: 1 CPT4 Code: Description: ICD-10 Diagnosis Description L97.512 Non-pressure chronic ulcer of other part of right foot with fat layer ex Modifier: posed Quantity: Electronic Signature(s) Signed: 05/24/2021 2:50:13 PM By: Worthy Keeler PA-C Entered By: Worthy Keeler on 05/24/2021 14:50:12

## 2021-05-25 ENCOUNTER — Other Ambulatory Visit: Payer: Self-pay | Admitting: Physician Assistant

## 2021-05-25 DIAGNOSIS — E11621 Type 2 diabetes mellitus with foot ulcer: Secondary | ICD-10-CM

## 2021-05-25 NOTE — Progress Notes (Signed)
Ralph Peterson ([id number]) Visit Report for 05/24/2021 Arrival Information Details Patient Name: Ralph Peterson, Ralph Peterson. Date of Service: 05/24/2021 2:00 PM Medical Record Number: [id number] Patient Account Number: [id number] Date of Birth/Sex: July 01, 1966 (55 y.o. M) Treating RN: Cornell Barman Primary Care Merric Yost: Vidal Schwalbe Other Clinician: Referring Samanatha Brammer: Vidal Schwalbe Treating Quantrell Splitt/Extender: Skipper Cliche in Treatment: 4 Visit Information History Since Last Visit Added or deleted any medications: No Patient Arrived: Ambulatory Has Dressing in Place as Prescribed: Yes Arrival Time: 14:19 Has Footwear/Offloading in Place as Prescribed: Yes Accompanied By: self Right: Total Contact Cast Transfer Assistance: None Pain Present Now: No Patient Identification Verified: Yes Secondary Verification Process Completed: Yes Patient Has Alerts: Yes Patient Alerts: Patient on Blood Thinner Eliquis Diabetic AVVS 07/2020 ABI Land[facility]=[place] Electronic Signature(s) Signed: 05/25/2021 12:18:06 PM By: Gretta Cool, BSN, RN, CWS, Kim RN, BSN Entered By: Gretta Cool, BSN, RN, CWS, Kim on 05/24/2021 14:20:01 Ralph Peterson ([id number]) -------------------------------------------------------------------------------- Encounter Discharge Information Details Patient Name: Ralph Peterson. Date of Service: 05/24/2021 2:00 PM Medical Record Number: [id number] Patient Account Number: [id number] Date of Birth/Sex: [birth year] (55 y.o. M) Treating RN: Cornell Barman Primary Care Contina Strain: Vidal Schwalbe Other Clinician: Referring Detrich Rakestraw: Vidal Schwalbe Treating Lyndzie Zentz/Extender: Skipper Cliche in Treatment: 4 Encounter Discharge Information Items Post Procedure Vitals Discharge Condition: Stable Temperature (F): 98.7 Ambulatory Status: Ambulatory Pulse (bpm): 96 Discharge Destination: Home Respiratory Rate (breaths/min): 16 Transportation: Private Auto Blood Pressure (mmHg):  128/79 Schedule Follow-up Appointment: Yes Clinical Summary of Care: Electronic Signature(s) Signed: 05/25/2021 12:18:06 PM By: Gretta Cool, BSN, RN, CWS, Kim RN, BSN Entered By: Gretta Cool, BSN, RN, CWS, Kim on 05/24/2021 15:04:58 Ralph Peterson ([id number]) -------------------------------------------------------------------------------- Lower Extremity Assessment Details Patient Name: Ralph Peterson. Date of Service: 05/24/2021 2:00 PM Medical Record Number: [id number] Patient Account Number: [id number] Date of Birth/Sex: Jun 05, 1966 (55 y.o. M) Treating RN: Cornell Barman Primary Care Meesha Sek: Vidal Schwalbe Other Clinician: Referring Nury Nebergall: Vidal Schwalbe Treating Adora Yeh/Extender: Skipper Cliche in Treatment: 4 Edema Assessment Assessed: Shirlyn Goltz: No] Patrice Paradise: Yes] Edema: [Left: N] [Right: o] Vascular Assessment Pulses: Dorsalis Pedis Palpable: [Right:Yes] Electronic Signature(s) Signed: 05/25/2021 12:18:06 PM By: Gretta Cool, BSN, RN, CWS, Kim RN, BSN Entered By: Gretta Cool, BSN, RN, CWS, Kim on 05/24/2021 14:23:39 Ralph Peterson ([id number]) -------------------------------------------------------------------------------- Multi Wound Chart Details Patient Name: Ralph Peterson Date of Service: 05/24/2021 2:00 PM Medical Record Number: [id number] Patient Account Number: [id number] Date of Birth/Sex: 02/24/1966 (55 y.o. M) Treating RN: Cornell Barman Primary Care Taron Conrey: Vidal Schwalbe Other Clinician: Referring Chaniece Barbato: Vidal Schwalbe Treating Fareeda Downard/Extender: Skipper Cliche in Treatment: 4 Vital Signs Height(in): 72 Pulse(bpm): 23 Weight(lbs): 219 Blood Pressure(mmHg): 128/79 Body Mass Index(BMI): 30 Temperature(F): 98.7 Respiratory Rate(breaths/min): 16 Photos: [N/A:N/A] Wound Location: Right, Distal, Plantar Foot N/A N/A Wounding Event: Blister N/A N/A Primary Etiology: Diabetic Wound/Ulcer of the Lower N/A N/A Extremity Comorbid History: Arrhythmia,  Congestive Heart N/A N/A Failure, Coronary Artery Disease, Hypertension, Myocardial Infarction, Peripheral Venous Disease, Type II Diabetes, Neuropathy Date Acquired: 04/10/2021 N/A N/A Weeks of Treatment: 4 N/A N/A Wound Status: Open N/A N/A Measurements L x W x D (cm) 1.7x2x0.6 N/A N/A Area (cm) : 2.67 N/A N/A Volume (cm) : 1.602 N/A N/A % Reduction in Area: -325.20% N/A N/A % Reduction in Volume: -1171.40% N/A N/A Classification: Grade 2 N/A N/A Exudate Amount: Medium N/A N/A Exudate Type: Serosanguineous N/A N/A Exudate Color: red, brown N/A N/A Wound Margin: Thickened N/A N/A Granulation Amount: None Present (0%) N/A N/A Necrotic Amount: Large (67-100%) N/A N/A Exposed Structures: Fat Layer (  Subcutaneous Tissue): N/A N/A Yes Fascia: No Tendon: No Muscle: No Joint: No Bone: No Epithelialization: None N/A N/A Treatment Notes Electronic Signature(s) Signed: 05/25/2021 12:18:06 PM By: Gretta Cool, BSN, RN, CWS, Kim RN, BSN Entered By: Gretta Cool, BSN, RN, CWS, Kim on 05/24/2021 14:40:41 Ralph Peterson ([id number]) -------------------------------------------------------------------------------- Multi-Disciplinary Care Plan Details Patient Name: Ralph Peterson, Ralph Peterson. Date of Service: 05/24/2021 2:00 PM Medical Record Number: [id number] Patient Account Number: [id number] Date of Birth/Sex: Apr 28, 1966 (55 y.o. M) Treating RN: Cornell Barman Primary Care Josel Keo: Vidal Schwalbe Other Clinician: Referring Durwood Dittus: Vidal Schwalbe Treating Elis Sauber/Extender: Skipper Cliche in Treatment: 4 Active Inactive Wound/Skin Impairment Nursing Diagnoses: Impaired tissue integrity Knowledge deficit related to smoking impact on wound healing Knowledge deficit related to ulceration/compromised skin integrity Goals: Patient/caregiver will verbalize understanding of skin care regimen Date Initiated: 04/24/2021 Target Resolution Date: 05/11/2021 Goal Status: Active Ulcer/skin breakdown  will have a volume reduction of 30% by week 4 Date Initiated: 04/24/2021 Target Resolution Date: 05/24/2021 Goal Status: Active Ulcer/skin breakdown will have a volume reduction of 50% by week 8 Date Initiated: 04/24/2021 Target Resolution Date: 06/23/2021 Goal Status: Active Ulcer/skin breakdown will have a volume reduction of 80% by week 12 Date Initiated: 04/24/2021 Target Resolution Date: 07/24/2021 Goal Status: Active Ulcer/skin breakdown will heal within 14 weeks Date Initiated: 04/24/2021 Target Resolution Date: 08/05/2021 Goal Status: Active Interventions: Assess patient/caregiver ability to obtain necessary supplies Assess patient/caregiver ability to perform ulcer/skin care regimen upon admission and as needed Assess ulceration(s) every visit Notes: Electronic Signature(s) Signed: 05/25/2021 12:18:06 PM By: Gretta Cool, BSN, RN, CWS, Kim RN, BSN Entered By: Gretta Cool, BSN, RN, CWS, Kim on 05/24/2021 14:40:27 Ralph Peterson ([id number]) -------------------------------------------------------------------------------- Pain Assessment Details Patient Name: Ralph Peterson. Date of Service: 05/24/2021 2:00 PM Medical Record Number: [id number] Patient Account Number: [id number] Date of Birth/Sex: 1966-02-20 (54 y.o. M) Treating RN: Cornell Barman Primary Care Zacarias Krauter: Vidal Schwalbe Other Clinician: Referring Sharmon Cheramie: Vidal Schwalbe Treating Haden Suder/Extender: Skipper Cliche in Treatment: 4 Active Problems Location of Pain Severity and Description of Pain Patient Has Paino No Site Locations Pain Management and Medication Current Pain Management: Notes Patient denies pain at this time. Electronic Signature(s) Signed: 05/25/2021 12:18:06 PM By: Gretta Cool, BSN, RN, CWS, Kim RN, BSN Entered By: Gretta Cool, BSN, RN, CWS, Kim on 05/24/2021 14:20:36 Ralph Peterson ([id number]) -------------------------------------------------------------------------------- Patient/Caregiver  Education Details Patient Name: Ralph Peterson Date of Service: 05/24/2021 2:00 PM Medical Record Number: [id number] Patient Account Number: [id number] Date of Birth/Gender: 10/29/1965 (55 y.o. M) Treating RN: Cornell Barman Primary Care Physician: Vidal Schwalbe Other Clinician: Referring Physician: Vidal Schwalbe Treating Physician/Extender: Skipper Cliche in Treatment: 4 Education Assessment Education Provided To: Patient Education Topics Provided Wound Debridement: Handouts: Wound Debridement Methods: Demonstration, Explain/Verbal Responses: State content correctly Wound/Skin Impairment: Handouts: Caring for Your Ulcer Methods: Demonstration, Explain/Verbal Responses: State content correctly Electronic Signature(s) Signed: 05/25/2021 12:18:06 PM By: Gretta Cool, BSN, RN, CWS, Kim RN, BSN Entered By: Gretta Cool, BSN, RN, CWS, Kim on 05/24/2021 15:03:25 Ralph Peterson ([id number]) -------------------------------------------------------------------------------- Wound Assessment Details Patient Name: Ralph Peterson. Date of Service: 05/24/2021 2:00 PM Medical Record Number: [id number] Patient Account Number: [id number] Date of Birth/Sex: Jan 15, 1966 (55 y.o. M) Treating RN: Cornell Barman Primary Care Quamere Mussell: Vidal Schwalbe Other Clinician: Referring Cherl Gorney: Vidal Schwalbe Treating Darnell Stimson/Extender: Skipper Cliche in Treatment: 4 Wound Status Wound Number: 10 Primary Diabetic Wound/Ulcer of the Lower Extremity Etiology: Wound Location: Right, Distal, Plantar Foot Wound Open Wounding Event: Blister Status: Date Acquired: 04/10/2021 Comorbid Arrhythmia, Congestive Heart Failure, Coronary  Artery Weeks Of Treatment: 4 History: Disease, Hypertension, Myocardial Infarction, Peripheral Clustered Wound: No Venous Disease, Type II Diabetes, Neuropathy Photos Wound Measurements Length: (cm) 1.7 % Re Width: (cm) 2 % Re Depth: (cm) 0.6 Epit Area: (cm) 2.67  Tun Volume: (cm) 1.602 Und duction in Area: -325.2% duction in Volume: -1171.4% helialization: None neling: No ermining: No Wound Description Classification: Grade 2 Foul Wound Margin: Thickened Slou Exudate Amount: Medium Exudate Type: Serosanguineous Exudate Color: red, brown Odor After Cleansing: No gh/Fibrino Yes Wound Bed Granulation Amount: None Present (0%) Exposed Structure Necrotic Amount: Large (67-100%) Fascia Exposed: No Necrotic Quality: Adherent Slough Fat Layer (Subcutaneous Tissue) Exposed: Yes Tendon Exposed: No Muscle Exposed: No Joint Exposed: No Bone Exposed: No Treatment Notes Wound #10 (Foot) Wound Laterality: Plantar, Right, Distal Cleanser Peri-Wound Care Topical TRELON, PLUSH ([id number]) Primary Dressing Hydrofera Blue Ready Transfer Foam, 2.5x2.5 (in/in) Discharge Instruction: Apply Hydrofera Blue Ready to wound bed as directed Secondary Dressing Zetuvit Plus Silicone Border Dressing 5x5 (in/in) Secured With Compression Wrap Compression Stockings Add-Ons Notes Offloading shoe with peg assist Electronic Signature(s) Signed: 05/25/2021 12:18:06 PM By: Gretta Cool, BSN, RN, CWS, Kim RN, BSN Entered By: Gretta Cool, BSN, RN, CWS, Kim on 05/24/2021 14:22:40 Ralph Peterson ([id number]) -------------------------------------------------------------------------------- Vitals Details Patient Name: Ralph Peterson Date of Service: 05/24/2021 2:00 PM Medical Record Number: [id number] Patient Account Number: [id number] Date of Birth/Sex: 1966/05/09 (55 y.o. M) Treating RN: Cornell Barman Primary Care Delynn Pursley: Vidal Schwalbe Other Clinician: Referring Jaylie Neaves: Vidal Schwalbe Treating Jordy Verba/Extender: Skipper Cliche in Treatment: 4 Vital Signs Time Taken: 14:20 Temperature (F): 98.7 Height (in): 72 Pulse (bpm): 96 Weight (lbs): 219 Respiratory Rate (breaths/min): 16 Body Mass Index (BMI): 29.7 Blood Pressure (mmHg):  128/79 Reference Range: 80 - 120 mg / dl Electronic Signature(s) Signed: 05/25/2021 12:18:06 PM By: Gretta Cool, BSN, RN, CWS, Kim RN, BSN Entered By: Gretta Cool, BSN, RN, CWS, Kim on 05/24/2021 14:20:23

## 2021-05-28 ENCOUNTER — Ambulatory Visit: Payer: Medicare PPO

## 2021-05-30 ENCOUNTER — Ambulatory Visit: Payer: Medicare PPO

## 2021-05-31 ENCOUNTER — Encounter: Payer: Medicare PPO | Admitting: Physician Assistant

## 2021-05-31 ENCOUNTER — Ambulatory Visit
Admission: RE | Admit: 2021-05-31 | Discharge: 2021-05-31 | Disposition: A | Discharge status: Home / Self Care | Payer: Medicare PPO | Source: Ambulatory Visit | Attending: Physician Assistant | Admitting: Physician Assistant

## 2021-05-31 ENCOUNTER — Other Ambulatory Visit: Payer: Self-pay

## 2021-05-31 DIAGNOSIS — E11621 Type 2 diabetes mellitus with foot ulcer: Secondary | ICD-10-CM | POA: Diagnosis present

## 2021-05-31 DIAGNOSIS — M86671 Other chronic osteomyelitis, right ankle and foot: Secondary | ICD-10-CM | POA: Diagnosis not present

## 2021-05-31 NOTE — Progress Notes (Signed)
[name], [name] ([id number]) Visit Report for 05/31/2021 Arrival Information Details Patient Name: Ralph Peterson, Ralph Peterson. Date of Service: 05/31/2021 2:00 PM Medical Record Number: [id number] Patient Account Number: [id number] Date of Birth/Sex: 04/23/1966 (55 y.o. M) Treating RN: Cornell Barman Primary Care Ciaira Natividad: Vidal Schwalbe Other Clinician: Referring Beuna Bolding: Vidal Schwalbe Treating Reegan Bouffard/Extender: Skipper Cliche in Treatment: 5 Visit Information History Since Last Visit Added or deleted any medications: No Patient Arrived: Ambulatory Has Dressing in Place as Prescribed: Yes Arrival Time: 14:18 Has Footwear/Offloading in Place as Prescribed: Yes Accompanied By: friend Right: Surgical Shoe with Pressure Relief Transfer Assistance: None Insole Patient Identification Verified: Yes Pain Present Now: No Secondary Verification Process Completed: Yes Patient Has Alerts: Yes Patient Alerts: Patient on Blood Thinner Eliquis Diabetic AVVS 07/2020 ABI LandR=[place] Electronic Signature(s) Signed: 05/31/2021 4:13:36 PM By: Gretta Cool, BSN, RN, CWS, Kim RN, BSN Entered By: Gretta Cool, BSN, RN, CWS, Kim on 05/31/2021 14:18:40 Ralph Peterson ([id number]) -------------------------------------------------------------------------------- Encounter Discharge Information Details Patient Name: Ralph Peterson. Date of Service: 05/31/2021 2:00 PM Medical Record Number: [id number] Patient Account Number: [id number] Date of Birth/Sex: November 04, 1965 (55 y.o. M) Treating RN: Cornell Barman Primary Care Declin Rajan: Vidal Schwalbe Other Clinician: Referring Leonel Mccollum: Vidal Schwalbe Treating Laporscha Linehan/Extender: Skipper Cliche in Treatment: 5 Encounter Discharge Information Items Post Procedure Vitals Discharge Condition: Stable Temperature (F): 98.4 Ambulatory Status: Ambulatory Pulse (bpm): 91 Discharge Destination: Home Respiratory Rate (breaths/min): 16 Transportation: Private  Auto Blood Pressure (mmHg): 128/82 Schedule Follow-up Appointment: Yes Clinical Summary of Care: Electronic Signature(s) Signed: 05/31/2021 4:13:36 PM By: Gretta Cool, BSN, RN, CWS, Kim RN, BSN Entered By: Gretta Cool, BSN, RN, CWS, Kim on 05/31/2021 15:08:08 Ralph Peterson ([id number]) -------------------------------------------------------------------------------- Lower Extremity Assessment Details Patient Name: Ralph Peterson. Date of Service: 05/31/2021 2:00 PM Medical Record Number: [id number] Patient Account Number: [id number] Date of Birth/Sex: 1966-01-14 (55 y.o. M) Treating RN: Cornell Barman Primary Care Tomica Arseneault: Vidal Schwalbe Other Clinician: Referring Jalon Squier: Vidal Schwalbe Treating Marylyn Appenzeller/Extender: Skipper Cliche in Treatment: 5 Vascular Assessment Pulses: Dorsalis Pedis Palpable: [Right:Yes] Electronic Signature(s) Signed: 05/31/2021 4:13:36 PM By: Gretta Cool, BSN, RN, CWS, Kim RN, BSN Entered By: Gretta Cool, BSN, RN, CWS, Kim on 05/31/2021 14:27:26 Ralph Peterson ([id number]) -------------------------------------------------------------------------------- Multi Wound Chart Details Patient Name: Ralph Peterson Date of Service: 05/31/2021 2:00 PM Medical Record Number: [id number] Patient Account Number: [id number] Date of Birth/Sex: 1965/11/22 (55 y.o. M) Treating RN: Cornell Barman Primary Care Decklan Mau: Vidal Schwalbe Other Clinician: Referring Yuritza Paulhus: Vidal Schwalbe Treating Aalia Greulich/Extender: Skipper Cliche in Treatment: 5 Vital Signs Height(in): 72 Pulse(bpm): 16 Weight(lbs): 219 Blood Pressure(mmHg): 128/82 Body Mass Index(BMI): 30 Temperature(F): 98.4 Respiratory Rate(breaths/min): 16 Photos: [N/A:N/A] Wound Location: Right, Distal, Plantar Foot N/A N/A Wounding Event: Blister N/A N/A Primary Etiology: Diabetic Wound/Ulcer of the Lower N/A N/A Extremity Comorbid History: Arrhythmia, Congestive Heart N/A N/A Failure, Coronary Artery  Disease, Hypertension, Myocardial Infarction, Peripheral Venous Disease, Type II Diabetes, Neuropathy Date Acquired: 04/10/2021 N/A N/A Weeks of Treatment: 5 N/A N/A Wound Status: Open N/A N/A Measurements L x W x D (cm) 1.5x1.5x0.5 N/A N/A Area (cm) : 1.767 N/A N/A Volume (cm) : 0.884 N/A N/A % Reduction in Area: -181.40% N/A N/A % Reduction in Volume: -601.60% N/A N/A Classification: Grade 2 N/A N/A Exudate Amount: Medium N/A N/A Exudate Type: Serosanguineous N/A N/A Exudate Color: red, brown N/A N/A Wound Margin: Thickened N/A N/A Granulation Amount: Small (1-33%) N/A N/A Granulation Quality: Pink, Hyper-granulation N/A N/A Necrotic Amount: Large (67-100%) N/A N/A Exposed Structures: Fat Layer (Subcutaneous Tissue): N/A N/A  Yes Fascia: No Tendon: No Muscle: No Joint: No Bone: No Epithelialization: None N/A N/A Treatment Notes Electronic Signature(s) Signed: 05/31/2021 4:13:36 PM By: Gretta Cool, BSN, RN, CWS, Kim RN, BSN Entered By: Gretta Cool, BSN, RN, CWS, Kim on 05/31/2021 14:48:09 Ralph Peterson, Ralph Peterson, Ralph ([id number]) -------------------------------------------------------------------------------- [place] Details Patient Name: Ralph Peterson, Ralph Peterson. Date of Service: 05/31/2021 2:00 PM Medical Record Number: [id number] Patient Account Number: [id number] Date of Birth/Sex: 08-20-1965 (55 y.o. M) Treating RN: Cornell Barman Primary Care Ramani Riva: Vidal Schwalbe Other Clinician: Referring Desera Graffeo: Vidal Schwalbe Treating Romaine Maciolek/Extender: Skipper Cliche in Treatment: 5 Active Inactive Wound/Skin Impairment Nursing Diagnoses: Impaired tissue integrity Knowledge deficit related to smoking impact on wound healing Knowledge deficit related to ulceration/compromised skin integrity Goals: Patient/caregiver will verbalize understanding of skin care regimen Date Initiated: 04/24/2021 Target Resolution Date: 05/11/2021 Goal  Status: Active Ulcer/skin breakdown will have a volume reduction of 30% by week 4 Date Initiated: 04/24/2021 Target Resolution Date: 05/24/2021 Goal Status: Active Ulcer/skin breakdown will have a volume reduction of 50% by week 8 Date Initiated: 04/24/2021 Target Resolution Date: 06/23/2021 Goal Status: Active Ulcer/skin breakdown will have a volume reduction of 80% by week 12 Date Initiated: 04/24/2021 Target Resolution Date: 07/24/2021 Goal Status: Active Ulcer/skin breakdown will heal within 14 weeks Date Initiated: 04/24/2021 Target Resolution Date: 08/05/2021 Goal Status: Active Interventions: Assess patient/caregiver ability to obtain necessary supplies Assess patient/caregiver ability to perform ulcer/skin care regimen upon admission and as needed Assess ulceration(s) every visit Notes: Electronic Signature(s) Signed: 05/31/2021 4:13:36 PM By: Gretta Cool, BSN, RN, CWS, Kim RN, BSN Entered By: Gretta Cool, BSN, RN, CWS, Kim on 05/31/2021 14:27:31 Ralph Peterson ([id number]) -------------------------------------------------------------------------------- Pain Assessment Details Patient Name: Ralph Peterson. Date of Service: 05/31/2021 2:00 PM Medical Record Number: [id number] Patient Account Number: [id number] Date of Birth/Sex: 17-Jul-1966 (55 y.o. M) Treating RN: Cornell Barman Primary Care Zera Markwardt: Vidal Schwalbe Other Clinician: Referring Angeleah Labrake: Vidal Schwalbe Treating Laveda Demedeiros/Extender: Skipper Cliche in Treatment: 5 Active Problems Location of Pain Severity and Description of Pain Patient Has Paino No Site Locations Pain Management and Medication Current Pain Management: Notes Patient denies pain at this time. Electronic Signature(s) Signed: 05/31/2021 4:13:36 PM By: Gretta Cool, BSN, RN, CWS, Kim RN, BSN Entered By: Gretta Cool, BSN, RN, CWS, Kim on 05/31/2021 14:19:23 Ralph Peterson  ([id number]) -------------------------------------------------------------------------------- Patient/Caregiver Education Details Patient Name: Ralph Peterson Date of Service: 05/31/2021 2:00 PM Medical Record Number: [id number] Patient Account Number: [id number] Date of Birth/Gender: [birth year] (54 y.o. M) Treating RN: Cornell Barman Primary Care Physician: Vidal Schwalbe Other Clinician: Referring Physician: Vidal Schwalbe Treating Physician/Extender: Skipper Cliche in Treatment: 5 Education Assessment Education Provided To: Patient Education Topics Provided Wound/Skin Impairment: Handouts: Caring for Your Ulcer Methods: Demonstration, Explain/Verbal Responses: State content correctly Electronic Signature(s) Signed: 05/31/2021 4:13:36 PM By: Gretta Cool, BSN, RN, CWS, Kim RN, BSN Entered By: Gretta Cool, BSN, RN, CWS, Kim on 05/31/2021 15:07:03 Ralph Peterson ([id number]) -------------------------------------------------------------------------------- Wound Assessment Details Patient Name: Ralph Peterson Date of Service: 05/31/2021 2:00 PM Medical Record Number: [id number] Patient Account Number: [id number] Date of Birth/Sex: [birth year] (55 y.o. M) Treating RN: Cornell Barman Primary Care Aryon Nham: Vidal Schwalbe Other Clinician: Referring Masaye Gatchalian: Vidal Schwalbe Treating Mychael Soots/Extender: Skipper Cliche in Treatment: 5 Wound Status Wound Number: 10 Primary Diabetic Wound/Ulcer of the Lower Extremity Etiology: Wound Location: Right, Distal, Plantar Foot Wound Open Wounding Event: Blister Status: Date Acquired: 04/10/2021 Comorbid Arrhythmia, Congestive Heart Failure, Coronary Artery Weeks Of Treatment: 5 History: Disease, Hypertension, Myocardial Infarction, Peripheral Clustered  Wound: No Venous Disease, Type II Diabetes, Neuropathy Photos Wound Measurements Length: (cm) 1.5 % R Width: (cm) 1.5 % R Depth: (cm) 0.5 Epi Area: (cm) 1.767 Tu Volume: (cm)  0.884 Un eduction in Area: -181.4% eduction in Volume: -601.6% thelialization: None nneling: No dermining: No Wound Description Classification: Grade 2 Fou Wound Margin: Thickened Slo Exudate Amount: Medium Exudate Type: Serosanguineous Exudate Color: red, brown l Odor After Cleansing: No ugh/Fibrino Yes Wound Bed Granulation Amount: Small (1-33%) Exposed Structure Granulation Quality: Pink, Hyper-granulation Fascia Exposed: No Necrotic Amount: Large (67-100%) Fat Layer (Subcutaneous Tissue) Exposed: Yes Necrotic Quality: Adherent Slough Tendon Exposed: No Muscle Exposed: No Joint Exposed: No Bone Exposed: No Treatment Notes Wound #10 (Foot) Wound Laterality: Plantar, Right, Distal Cleanser Peri-Wound Care Topical BRYLON, BRENNING ([id number]) Primary Dressing Hydrofera Blue Ready Transfer Foam, 2.5x2.5 (in/in) Discharge Instruction: Apply Hydrofera Blue Ready to wound bed as directed Secondary Dressing Zetuvit Plus Silicone Border Dressing 5x5 (in/in) Secured With Compression Wrap Compression Stockings Environmental education officer) Signed: 05/31/2021 4:13:36 PM By: Gretta Cool, BSN, RN, CWS, Kim RN, BSN Entered By: Gretta Cool, BSN, RN, CWS, Kim on 05/31/2021 14:26:56 Ralph Peterson ([id number]) -------------------------------------------------------------------------------- [place] Details Patient Name: Ralph Peterson Date of Service: 05/31/2021 2:00 PM Medical Record Number: [id number] Patient Account Number: [id number] Date of Birth/Sex: 06-30-1966 (55 y.o. M) Treating RN: Cornell Barman Primary Care Katlen Seyer: Vidal Schwalbe Other Clinician: Referring Clothilde Tippetts: Vidal Schwalbe Treating Yelina Sarratt/Extender: Skipper Cliche in Treatment: 5 Vital Signs Time Taken: 14:18 Temperature (F): 98.4 Height (in): 72 Pulse (bpm): 91 Weight (lbs): 219 Respiratory Rate (breaths/min): 16 Body Mass Index (BMI): 29.7 Blood Pressure (mmHg): 128/82 Reference Range:  80 - 120 mg / dl Electronic Signature(s) Signed: 05/31/2021 4:13:36 PM By: Gretta Cool, BSN, RN, CWS, Kim RN, BSN Entered By: Gretta Cool, BSN, RN, CWS, Kim on 05/31/2021 14:19:08

## 2021-05-31 NOTE — Progress Notes (Addendum)
TAYLOR, LEVICK ([id number]) Visit Report for 05/31/2021 Chief Complaint Document Details Patient Name: Ralph Peterson, Ralph Peterson. Date of Service: 05/31/2021 2:00 PM Medical Record Number: [id number] Patient Account Number: [id number] Date of Birth/Sex: 1965/08/12 (55 y.o. 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 M) Treating RN: Cornell Barman Primary Care Provider: Vidal Schwalbe Other Clinician: Referring Provider: Vidal Schwalbe Treating Provider/Extender: Skipper Cliche in Treatment: 5 Debridement Performed for Wound #10 Right,Distal,Plantar Foot Assessment: Performed By: Physician Tommie Sams., PA-C Debridement Type: Debridement Severity of Tissue Pre Debridement: Fat layer exposed Level of Consciousness (Pre- Awake and Alert procedure): Pre-procedure Verification/Time Out Yes - 14:48 Taken: Total Area Debrided (L x W): 1.5 (cm) x 1.5 (cm) = 2.25 (cm) Tissue and other material Viable, Non-Viable, Callus, Slough, Subcutaneous, Skin: Dermis , Skin: Epidermis, Slough debrided: Level: Skin/Subcutaneous Tissue Debridement Description: Excisional Instrument: Curette Bleeding: Moderate Hemostasis Achieved: Pressure Response to Treatment: Procedure was  tolerated well Level of Consciousness (Post- Awake and Alert procedure): Post Debridement Measurements of Total Wound Length: (cm) 1.6 Width: (cm) 1.6 Depth: (cm) 0.6 Volume: (cm) 1.206 Character of Wound/Ulcer Post Debridement: Stable Severity of Tissue Post Debridement: Fat layer exposed Post Procedure Diagnosis Same as Pre-procedure Electronic Signature(s) Signed: 05/31/2021 4:13:36 PM By: Gretta Cool, BSN, RN, CWS, Kim RN, BSN Signed: 05/31/2021 5:14:46 PM By: Worthy Keeler PA-C Entered By: Gretta Cool, BSN, RN, CWS, Kim on 05/31/2021 14:49:55 Ralph Peterson ([id number]) -------------------------------------------------------------------------------- HPI Details Patient Name: Ralph Peterson. Date of Service: 05/31/2021 2:00 PM Medical Record Number: [id number] Patient Account Number: [id number] Date of Birth/Sex: [birth year] (55 y.o. M) Treating RN: Cornell Barman Primary Care Provider: Vidal Schwalbe Other Clinician: Referring Provider: Vidal Schwalbe Treating Provider/Extender: Skipper Cliche in Treatment: 5 History of Present Illness HPI Description: 08/17/2020 upon evaluation today patient actually appears to be doing decently well today which is good news in regard to the dorsal wound which is almost healed and the lateral wound also doing much better. With regard to the plantar wound this is still draining purulent drainage unfortunately the culture did reveal that he had Pseudomonas as well as Enterococcus. With that being said he is allergic to penicillin which took away the only oral medication for the Enterococcus I did look at linezolid as a possibility but unfortunately there were medication interactions. Also the same was true the Cipro where there were medication interactions. Nonetheless I want to see if we can get him into infectious disease to see if they can work with him to try to find possibly add at home IV antibiotic regimen that can help out at this  point. No sharp debridement is can be necessary today. 09/07/2020 upon evaluation today patient appears to be doing unfortunately not too well in regard to his wound. He has been tolerating the dressing changes without complication. Fortunately there is no sign of active infection at this time. No fevers, chills, nausea, vomiting, or diarrhea. The patient did have a repeat surgery going to clean out the abscess and  try to work on getting things moving in a better direction about a week and a half ago. He still has sutures in place. With that being said unfortunately he does not appear to be showing signs of a lot of improvement in my opinion. He does have sutures on the plantar aspect still in place and on the dorsal aspect he has a small opening which actually probes all the way down through and actually came out of the plantar aspect. I feel like that he really is having a lot of issues here and I am afraid that if we do not get this taken care of this is can end up with him having a much larger and more extensive amputation than what he was hoping to have to deal with with this. It has already been since June 2021 but has been dealing with this including all the time since the amputation. 09/14/2020 upon evaluation today patient appears to be doing really about the same in regard to the wound on the dorsal surface of his foot. The plantar foot unfortunately still has sutures in place I really cannot see how things are doing in that regard. With the dorsal foot however this appears to show signs of still having a small opening in the distal aspect which does actually probe down to bone there is also significant purulent drainage we did actually obtain a wound culture today to further evaluate the issue here. 09/21/2020 upon evaluation today patient appears to be doing about the same in regard to his wound on the dorsal surface of his foot and the plantar foot he still does not have the sutures out at  this point. That should hopefully happen in the next week. With that being said the main issue is he continues to have drainage here. It was noted that he had positive findings on culture for Enterobacter. With that being said he cannot take oral penicillin he could do vancomycin and I think that is good to be the ideal thing this to see if we can get him started on IV vancomycin at dialysis. We will get a get in touch with them to try to see if we can coordinate that being done there at the dialysis center. I think that is the ideal situation at this point I would probably start with just 2 weeks of therapy. 09/28/2020 upon evaluation today patient appears to be doing well with regard to his wounds in general. He also has back on the vancomycin after the culture results and this seems to be doing excellent for him. All the wounds are showing signs of good improvement. No fevers, chills, nausea, vomiting, or diarrhea. 10/05/2020 upon evaluation today patient appears to actually be doing excellent in regard to his foot. Fortunately there is a lot of signs of new granulation epithelization at this point. There does not appear to be any evidence of active infection which is great news and overall very pleased with where things stand. In fact the open wound along the plantar aspect of his foot as well as the dorsal part of his foot has dramatically improved since we went forward with the vancomycin and requested that the dialysis center provide this to him by way of IV antibiotics with his dialysis. Overall I think that has made a dramatic improvement and overall his wound seems to be doing significantly better at this point. No fevers, chills, nausea, vomiting, or diarrhea. The patient was set to have his second opinion appointment with  podiatry [facility] next week but to be perfectly honest I am not even certain that that is necessary at this point I do not think there is much that they would be able to do for  him nor that he really would want them to do considering how well things look at this time. 10/12/2020 on evaluation today patient appears to be doing excellent in regard to his wounds. In fact the only issue I see is that everything is dried out as far as the collagen is concerned. I do think we can try to keep this moist he will continue to make excellent progress as he has been. Overall I am extremely pleased with what I see today. 10/19/2020 upon evaluation today patient appears to be doing decently well in regard to the wounds on his foot he is can require some debridement in this location. Unfortunately is having some issues with blistering over the leg which is of greater concern at this point. Fortunately I do not see any signs of active infection which is great news. Nonetheless the blistering may just be related to venous insufficiency or subsequently could potentially be an issue with overall worsening with regard to an infection but again it is not really hot to touch. 10/26/2020 upon evaluation today patient appears to be doing well with regard to the majority of his wounds. Unfortunately the dorsal surface of his foot does not appear to be doing quite as well today compared to what it has been doing. I do think that we will need to address that currently. Everything else is showing signs of improvement. He did not get the prescription that I gave him previous as unfortunately the transmission failed. I did not know that until today when the patient mention to me that he never got the antibiotic. 11/02/20 upon evaluation today patient appears to be doing much better in regard to his wounds in general. I feel like that he is making good progress and even the top of the foot does not appear to be doing too poorly at this time. He did finally get the antibiotics which is good. Unfortunately it does not look like I am able to electronically sent to his pharmacy it kept being an error in  transmission every time this was sent. We finally decided to call it in. Nonetheless overall I think he is headed in the right direction based on what I am seeing. 11/09/2020 upon evaluation today patient appears to be doing well in general in regard to his wounds. I think that he is making some progress here. With that being said there is definitely still some areas of concern. Specifically the dorsal aspect of his foot distally and the lateral aspect of his foot proximally. Both of these areas are still open everything else appears to likely be closed today based on what I am seeing. 4/28; the patient really looks quite good. The area over the base of the right fifth metatarsal and the base of the fifth MTP both look healed to me. 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 They are using Hydrofera Blue rope but I think this is far too small wound now for this 11/23/2020 upon evaluation today patient actually appears to be doing excellent. In fact I do not really see anything that is actually open anywhere at this point. With that being said I am a little  reluctant to completely just healing out and discharge him based on the dorsal foot wound being closed even though I do not see anything open we have been there down this road before where things would reopen causing additional problems. Nonetheless I think that we need to monitor him a little bit more closely to make sure nothing worsens again. 11/30/2020 upon evaluation today patient actually appears to be doing quite well in regard to his wound at this point. He has been tolerating the dressing changes without complication which is great news and overall I am extremely pleased with where things stand today. No fevers, chills, nausea, vomiting, or diarrhea. Readmission: 04/24/2021 upon evaluation today patient presents for reevaluation  here in the clinic this is a patient that I have previously taken care of when he had surgical wounds that had to be healed and this was several months back he in fact healed I believe in May 2022. Subsequently he has an issue now with his foot as he has been walking around more and this has caused an abnormal pressure location. This again is on the foot where he had all the surgery previous. That is on the right plantar foot. With that being said unfortunately the pati having a significant issue here with pressure ent does seem to be which I think is not can be the easiest thing to manage due to the shape of his foot. He is supposed to be getting new diabetic shoes fitted but right now he still has the ones currently that he has been you been using previously. Otherwise none of his major medical problems have shifted at this point. 10/11; second visit for this man who has a area right plantar foot at roughly the fourth metatarsal head. We have been using Hydrofera Blue and a surgical shoe. 05/08/2021 upon evaluation today patient's wound is actually showing signs of being a little bit larger he has a lot of callus around the edges of the wound that is can have to be cleaned away this is some undermining region. Nonetheless I think if we get that cleared away we can actually go and see about put him in the total contact cast with Dr. Dellia Nims discussed with him last week. 05/10/2021 upon evaluation today patient's wound actually appears to be doing great in fact and just the 2 days since we put the cast on I think he has made even some good progress here. Nonetheless he is here for the obligatory first cast change 2 days after application. No sharp debridement necessary today. 05/17/2021 upon evaluation today patient appears to be doing well with regard to his plantar foot ulcer. I do feel like that there is much less in the way of breakdown and deterioration today as compared to his last visit. And  that is even better than the time before which was when we first put the cast on. Either way I think that we are headed in the right direction though there is bone in the base of the wound just underneath some tissue were not completely exposed but this is something that we need to keep a close eye on. I am concerned about how things are progressing in 1 to make sure that we keep as much pressure off as possible I think the total contact cast is good to be perfect for [place] to continue. 05/24/2021 upon evaluation today patient unfortunately appears to be doing a little bit worse in regard to the plantar foot ulcer it looks better  around the edges which is good the cast definitely was keeping pressure off. Unfortunately the issue is simply that the patient is having more trouble in the center part of the wound diagnosis last week and I was little concerned and then honestly I am not really certain that keeping the cast on is the best way to go. I do think he needs some sharp debridement today. 05/31/2021 upon evaluation today patient's wound actually appears to be doing slightly better he has been doing a good job in my opinion and staying off of this as much as possible. Obviously the more that he can stay off of it the better as far as I am concerned. He voiced understanding and states he is trying to do what he can in that regard. Electronic Signature(s) Signed: 05/31/2021 4:55:51 PM By: Worthy Keeler PA-C Entered By: Worthy Keeler on 05/31/2021 16:55:51 Ralph Peterson ([id number]) -------------------------------------------------------------------------------- Physical Exam Details Patient Name: Ralph Peterson Date of Service: 05/31/2021 2:00 PM Medical Record Number: [id number] Patient Account Number: [id number] Date of Birth/Sex: Jun 20, 1966 (55 y.o. M) Treating RN: Cornell Barman Primary Care Provider: Vidal Schwalbe Other Clinician: Referring Provider: Vidal Schwalbe Treating  Provider/Extender: Skipper Cliche in Treatment: 5 Constitutional Well-nourished and well-hydrated in no acute distress. Respiratory normal breathing without difficulty. Psychiatric this patient is able to make decisions and demonstrates good insight into disease process. Alert and Oriented x 3. pleasant and cooperative. Notes Upon inspection patient's wound bed actually showed signs of good granulation and epithelization at this point. Overall I do feel like that he is making excellent progress and I am very pleased with what we see from last week to this week. Nonetheless this still deep wound down to the fascial layer though bone is not exposed is very close. I do believe that he may benefit depending on what the MRI shows from potentially even I skin substitute to try to help speed things up from a healing perspective but again a lot depends on the MRI and what we need to do following that result. Electronic Signature(s) Signed: 05/31/2021 4:56:37 PM By: Worthy Keeler PA-C Entered By: Worthy Keeler on 05/31/2021 16:56:36 Ralph Peterson ([id number]) -------------------------------------------------------------------------------- Physician Orders Details Patient Name: Ralph Peterson Date of Service: 05/31/2021 2:00 PM Medical Record Number: [id number] Patient Account Number: [id number] Date of Birth/Sex: 1965/08/23 (55 y.o. 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 Bathing/ Shower/ Hygiene o May shower; gently cleanse wound with antibacterial soap, rinse and pat dry prior to dressing wounds o No tub bath. Off-Loading o Open toe surgical shoe with peg assist. o Other: - offload insert from [facility] Hyperbaric Oxygen Therapy Wound #10 Right,Distal,Plantar Foot o Evaluate for HBO Therapy o Indication and location: - Right foot osteomyelitis o If appropriate for treatment, begin HBOT per protocol: o 2.0 ATA for 90 Minutes without Air Breaks o One treatment per day (delivered Monday through Friday unless otherwise specified in Special Instructions below): o  Total # of Treatments: - 40 o Finger stick Blood Glucose Pre- and Post- HBOT Treatment. o Follow Hyperbaric Oxygen Glycemia Protocol Wound Treatment Wound #10 - Foot Wound Laterality: Plantar, Right, Distal Primary Dressing: Hydrofera Blue Ready Transfer Foam, 2.5x2.5 (in/in) (Generic) Every Other Day/15 Days Discharge Instructions: Apply Hydrofera Blue Ready to wound bed as directed Secondary Dressing: Zetuvit Plus Silicone Border Dressing 5x5 (in/in) Every Other Day/15 Days GLYCEMIA INTERVENTIONS PROTOCOL PRE-HBO GLYCEMIA INTERVENTIONS ACTION INTERVENTION Obtain pre-HBO capillary blood glucose (ensure 1 physician order is in chart). A. Notify HBO physician and await physician orders. 2 If result is 70 mg/dl or below: B. If the result meets the hospital definition of a critical result, follow hospital policy. If result is 71 mg/dl to 130 mg/dl: A. Give patient an 8 ounce Glucerna Shake, an 8 ounce Ensure, or 8 ounces of a Glucerna/Ensure equivalent dietary supplement*. KWELI, GRASSEL. ([id number]) B. Wait 30 minutes. C. Retest patientos capillary blood glucose (CBG). D. If result greater than or equal  to 110 mg/dl, proceed with HBO. If result less than 110 mg/dl, notify HBO physician and consider holding HBO. If result is 131 mg/dl to 249 mg/dl: A. Proceed with HBO. A. Notify HBO physician and await physician orders. B. It is recommended to hold HBO and do If result is 250 mg/dl or greater: blood/urine ketone testing. C. If the result meets the hospital definition of a critical result, follow hospital policy. POST-HBO GLYCEMIA INTERVENTIONS ACTION INTERVENTION Obtain post HBO capillary blood glucose (ensure 1 physician order is in chart). A. Notify HBO physician and await physician orders. 2 If result is 70 mg/dl or below: B. If the result meets the hospital definition of a critical result, follow hospital policy. A. Give patient an 8 ounce Glucerna Shake, an 8 ounce Ensure, or 8 ounces of a Glucerna/Ensure equivalent dietary supplement*. B. Wait 15 minutes for symptoms of hypoglycemia (i.e. nervousness, anxiety, If result is 71 mg/dl to 100 mg/dl: sweating, chills, clamminess, irritability, confusion, tachycardia or dizziness). C. If patient asymptomatic, discharge patient. If patient symptomatic, repeat capillary blood glucose (CBG) and notify HBO physician. If result is 101 mg/dl to 249 mg/dl: A. Discharge patient. A. Notify HBO physician and await physician orders. B. It is recommended to do blood/urine If result is 250 mg/dl or greater: ketone testing. C. If the result meets the hospital definition of a critical result, follow hospital policy. *Juice or candies are NOT equivalent products. If patient refuses the Glucerna or Ensure, please consult the hospital dietitian for an appropriate substitute. Electronic Signature(s) Signed: 06/04/2021 5:26:28 PM By: Worthy Keeler PA-C Previous Signature: 05/31/2021 4:13:36 PM Version By: Gretta Cool BSN, RN, CWS, Kim RN, BSN Previous Signature: 05/31/2021 5:14:46 PM Version By: Worthy Keeler PA-C Entered By: Worthy Keeler on 06/04/2021 16:26:58 Ralph Peterson ([id number]) -------------------------------------------------------------------------------- Problem List Details Patient Name: Ralph Peterson Date of Service: 05/31/2021 2:00 PM Medical Record Number: [id number] Patient Account Number: [id number] Date of Birth/Sex: [birth year] (55 y.o. M) Treating RN: Cornell Barman Primary Care Provider: Vidal Schwalbe Other Clinician: Referring Provider: Vidal Schwalbe Treating Provider/Extender: Skipper Cliche in Treatment: 5 Active Problems ICD-10 Encounter Code Description Active Date MDM Diagnosis [phone] Other chronic osteomyelitis, right ankle and foot 05/31/2021 No Yes E11.621 Type 2 diabetes mellitus with foot ulcer 04/24/2021 No Yes L97.512 Non-pressure chronic ulcer of other part of right foot with fat layer 04/24/2021 No Yes exposed I48.0 Paroxysmal atrial fibrillation 04/24/2021 No Yes Z79.01 Long term (current) use of anticoagulants  04/24/2021 No Yes I50.42 Chronic combined systolic (congestive) and diastolic (congestive) heart 04/24/2021 No Yes failure N18.6 End stage renal disease 04/24/2021 No Yes Z99.2 Dependence on renal dialysis 04/24/2021 No Yes Inactive Problems Resolved Problems Electronic Signature(s) Signed: 06/04/2021 4:27:45 PM By: Worthy Keeler PA-C Previous Signature: 05/31/2021 2:46:01 PM Version By: Worthy Keeler PA-C Entered By: Worthy Keeler on 06/04/2021 16:27:45 Ralph Peterson ([id number]) -------------------------------------------------------------------------------- Progress Note Details Patient Name: Ralph Peterson. Date of Service: 05/31/2021 2:00 PM Medical Record Number: [id number] Patient Account Number: [id number] Date of Birth/Sex: [birth year] (55 y.o. 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 With regard to the plantar wound this is still draining purulent drainage unfortunately the culture did reveal that he had Pseudomonas as well as Enterococcus. With that being said he is allergic to penicillin which took away the only oral medication for the Enterococcus I did look at linezolid as a possibility but unfortunately there were medication interactions. Also the same was true the Cipro where there were medication interactions. Nonetheless I want to see if we can get him into infectious disease to see if they can work with him to try to find possibly add at home IV antibiotic regimen that can help out at this point. No sharp debridement is can be necessary today. 09/07/2020 upon evaluation today patient appears to be doing unfortunately not too well in regard to his wound. He has been tolerating the dressing changes without complication. Fortunately there is no sign of active infection at this time. No fevers, chills, nausea, vomiting, or diarrhea. The patient did have a repeat surgery going to clean out the abscess and try to work on getting things moving in a better direction about a week and a half ago. He still has sutures in place. With that being said unfortunately he does not appear to be showing signs of a lot of improvement in my opinion. He does have sutures on the plantar aspect still in place and on the dorsal aspect he has a small opening which actually probes all the way down through and actually came out of the plantar aspect. I feel like that he really is having a lot of issues here  and I am afraid that if we do not get this taken care of this is can end up with him having a much larger and more extensive amputation than what he was hoping to have to deal with with this. It has already been since June 2021 but has been dealing with this including all the time since the amputation. 09/14/2020 upon evaluation today patient appears to be doing really about the same in regard to the wound on the dorsal surface of his foot. The plantar foot unfortunately still has sutures in place I really cannot see how things are doing in that regard. With the dorsal foot however this appears to show signs of still having a small opening in the distal aspect which does actually probe down to bone there is also significant purulent drainage we did actually obtain a  wound culture today to further evaluate the issue here. 09/21/2020 upon evaluation today patient appears to be doing about the same in regard to his wound on the dorsal surface of his foot and the plantar foot he still does not have the sutures out at this point. That should hopefully happen in the next week. With that being said the main issue is he continues to have drainage here. It was noted that he had positive findings on culture for Enterobacter. With that being said he cannot take oral penicillin he could do vancomycin and I think that is good to be the ideal thing this to see if we can get him started on IV vancomycin at dialysis. We will get a get in touch with them to try to see if we can coordinate that being done there at the dialysis center. I think that is the ideal situation at this point I would probably start with just 2 weeks of therapy. 09/28/2020 upon evaluation today patient appears to be doing well with regard to his wounds in general. He also has back on the vancomycin after the culture results and this seems to be doing excellent for him. All the wounds are showing signs of good improvement. No fevers, chills,  nausea, vomiting, or diarrhea. 10/05/2020 upon evaluation today patient appears to actually be doing excellent in regard to his foot. Fortunately there is a lot of signs of new granulation epithelization at this point. There does not appear to be any evidence of active infection which is great news and overall very pleased with where things stand. In fact the open wound along the plantar aspect of his foot as well as the dorsal part of his foot has dramatically improved since we went forward with the vancomycin and requested that the dialysis center provide this to him by way of IV antibiotics with his dialysis. Overall I think that has made a dramatic improvement and overall his wound seems to be doing significantly better at this point. No fevers, chills, nausea, vomiting, or diarrhea. The patient was set to have his second opinion appointment with podiatry [facility] next week but to be perfectly honest I am not even certain that that is necessary at this point I do not think there is much that they would be able to do for him nor that he really would want them to do considering how well things look at this time. 10/12/2020 on evaluation today patient appears to be doing excellent in regard to his wounds. In fact the only issue I see is that everything is dried out as far as the collagen is concerned. I do think we can try to keep this moist he will continue to make excellent progress as he has been. Overall I am extremely pleased with what I see today. 10/19/2020 upon evaluation today patient appears to be doing decently well in regard to the wounds on his foot he is can require some debridement in this location. Unfortunately is having some issues with blistering over the leg which is of greater concern at this point. Fortunately I do not see any signs of active infection which is great news. Nonetheless the blistering may just be related to venous insufficiency or subsequently could potentially be an  issue with overall worsening with regard to an infection but again it is not really hot to touch. 10/26/2020 upon evaluation today patient appears to be doing well with regard to the majority of his wounds. Unfortunately the dorsal surface of his  foot does not appear to be doing quite as well today compared to what it has been doing. I do think that we will need to address that currently. Everything else is showing signs of improvement. He did not get the prescription that I gave him previous as unfortunately the transmission failed. I did not know that until today when the patient mention to me that he never got the antibiotic. 11/02/20 upon evaluation today patient appears to be doing much better in regard to his wounds in general. I feel like that he is making good progress and even the top of the foot does not appear to be doing too poorly at this time. He did finally get the antibiotics which is good. Unfortunately it does not look like I am able to electronically sent to his pharmacy it kept being an error in transmission every time this was sent. We finally decided to call it in. Nonetheless overall I think he is headed in the right direction based on what I am seeing. Ralph Peterson, Ralph Peterson ([id number]) 11/09/2020 upon evaluation today patient appears to be doing well in general in regard to his wounds. I think that he is making some progress here. With that being said there is definitely still some areas of concern. Specifically the dorsal aspect of his foot distally and the lateral aspect of his foot proximally. Both of these areas are still open everything else appears to likely be closed today based on what I am seeing. 4/28; the patient really looks quite good. The area over the base of the right fifth metatarsal and the base of the fifth MTP both look healed to me. 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 They are  using Hydrofera Blue rope but I think this is far too small wound now for this 11/23/2020 upon evaluation today patient actually appears to be doing excellent. In fact I do not really see anything that is actually open anywhere at this point. With that being said I am a little reluctant to completely just healing out and discharge him based on the dorsal foot wound being closed even though I do not see anything open we have been there down this road before where things would reopen causing additional problems. Nonetheless I think that we need to monitor him a little bit more closely to make sure nothing worsens again. 11/30/2020 upon evaluation today patient actually appears to be doing quite well in regard to his wound at this point. He has been tolerating the dressing changes without complication which is great news and overall I am extremely pleased with where things stand today. No fevers, chills, nausea, vomiting, or diarrhea. Readmission: 04/24/2021 upon evaluation today patient presents for reevaluation here in the clinic this is a patient that I have previously taken care of when he had surgical wounds that had to be healed and this was several months back he in fact healed I believe in May 2022. Subsequently he has an issue now with his foot as he has been walking around more and this has caused an abnormal pressure location. This again is on the foot where he had all the surgery previous. That is on the right plantar foot. With that being said unfortunately the pati having a significant issue here with pressure ent does seem to be which I think is not can be the easiest thing to manage due to the shape  of his foot. He is supposed to be getting new diabetic shoes fitted but right now he still has the ones currently that he has been you been using previously. Otherwise none of his major medical problems have shifted at this point. 10/11; second visit for this man who has a area right plantar  foot at roughly the fourth metatarsal head. We have been using Hydrofera Blue and a surgical shoe. 05/08/2021 upon evaluation today patient's wound is actually showing signs of being a little bit larger he has a lot of callus around the edges of the wound that is can have to be cleaned away this is some undermining region. Nonetheless I think if we get that cleared away we can actually go and see about put him in the total contact cast with Dr. Dellia Nims discussed with him last week. 05/10/2021 upon evaluation today patient's wound actually appears to be doing great in fact and just the 2 days since we put the cast on I think he has made even some good progress here. Nonetheless he is here for the obligatory first cast change 2 days after application. No sharp debridement necessary today. 05/17/2021 upon evaluation today patient appears to be doing well with regard to his plantar foot ulcer. I do feel like that there is much less in the way of breakdown and deterioration today as compared to his last visit. And that is even better than the time before which was when we first put the cast on. Either way I think that we are headed in the right direction though there is bone in the base of the wound just underneath some tissue were not completely exposed but this is something that we need to keep a close eye on. I am concerned about how things are progressing in 1 to make sure that we keep as much pressure off as possible I think the total contact cast is good to be perfect for [place] to continue. 05/24/2021 upon evaluation today patient unfortunately appears to be doing a little bit worse in regard to the plantar foot ulcer it looks better around the edges which is good the cast definitely was keeping pressure off. Unfortunately the issue is simply that the patient is having more trouble in the center part of the wound diagnosis last week and I was little concerned and then honestly I am not really certain that  keeping the cast on is the best way to go. I do think he needs some sharp debridement today. 05/31/2021 upon evaluation today patient's wound actually appears to be doing slightly better he has been doing a good job in my opinion and staying off of this as much as possible. Obviously the more that he can stay off of it the better as far as I am concerned. He voiced understanding and states he is trying to do what he can in that regard. Objective Constitutional Well-nourished and well-hydrated in no acute distress. Vitals Time Taken: 2:18 PM, Height: 72 in, Weight: 219 lbs, BMI: 29.7, Temperature: 98.4 F, Pulse: 91 bpm, Respiratory Rate: 16 breaths/min, Blood Pressure: 128/82 mmHg. Respiratory normal breathing without difficulty. Psychiatric this patient is able to make decisions and demonstrates good insight into disease process. Alert and Oriented x 3. pleasant and cooperative. General Notes: Upon inspection patient's wound bed actually showed signs of good granulation and epithelization at this point. Overall I do feel like that he is making excellent progress and I am very pleased with what we see from last week  to this week. Nonetheless this still deep wound Ralph Peterson, Ralph S. ([id number]) down to the fascial layer though bone is not exposed is very close. I do believe that he may benefit depending on what the MRI shows from potentially even I skin substitute to try to help speed things up from a healing perspective but again a lot depends on the MRI and what we need to do following that result. Integumentary (Hair, Skin) Wound #10 status is Open. Original cause of wound was Blister. The date acquired was: 04/10/2021. The wound has been in treatment 5 weeks. The wound is located on the Burns. The wound measures 1.5cm length x 1.5cm width x 0.5cm depth; 1.767cm^2 area and 0.884cm^3 volume. There is Fat Layer (Subcutaneous Tissue) exposed. There is no tunneling or  undermining noted. There is a medium amount of serosanguineous drainage noted. The wound margin is thickened. There is small (1-33%) pink, hyper - granulation within the wound bed. There is a large (67-100%) amount of necrotic tissue within the wound bed including Adherent Slough. 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 There was a Excisional Skin/Subcutaneous Tissue Debridement with a total area of 2.25 sq cm performed by Tommie Sams., PA-C. With the following instrument(s): Curette to remove Viable and Non-Viable tissue/material. Material removed includes Callus, Subcutaneous Tissue, Slough, Skin: Dermis, and Skin: Epidermis. No specimens were taken. A time out was conducted at 14:48, prior to the start of the procedure. A Moderate amount of bleeding was controlled with Pressure. The procedure was tolerated well. Post Debridement Measurements: 1.6cm length x 1.6cm width x 0.6cm depth; 1.206cm^3 volume. Character of Wound/Ulcer Post Debridement is stable. Severity of Tissue Post Debridement is: Fat layer exposed. Post procedure Diagnosis Wound #10: Same as Pre-Procedure Plan Follow-up Appointments: Return Appointment in 1 week. Bathing/ Shower/ Hygiene: May shower; gently cleanse wound with antibacterial soap, rinse and pat dry prior to dressing wounds No tub bath. Off-Loading: Open toe surgical shoe with peg  assist. Other: - offload insert from [facility] Hyperbaric Oxygen Therapy: Wound #10 Right,Distal,Plantar Foot: Evaluate for HBO Therapy Indication and location: - Right foot osteomyelitis If appropriate for treatment, begin HBOT per protocol: 2.0 ATA for 90 Minutes without Air Breaks One treatment per day (delivered Monday through Friday unless otherwise specified in Special Instructions below): Total # of Treatments: - 40 Finger stick Blood Glucose Pre- and Post- HBOT Treatment. Follow Hyperbaric Oxygen Glycemia Protocol WOUND #10: - Foot Wound Laterality: Plantar, Right, Distal Primary Dressing: Hydrofera Blue Ready Transfer Foam, 2.5x2.5 (in/in) (Generic) Every Other Day/15 Days Discharge Instructions: Apply Hydrofera Blue Ready to wound bed as directed Secondary Dressing: Zetuvit Plus Silicone Border Dressing 5x5 (in/in) Every Other Day/15 Days Ralph Peterson, Ralph S. ([id number]) 1. Would recommend currently that we going continue with the [facility] for the time being and the patient is in agreement with that plan. 2. We will also await the results of the treatment regimen and the patient is in agreement with that plan. MRI. Depending on the results we will make adjustments  as necessary in the treatment plan. 3. In the interim the patient should continue to offload as effectively as he can to try to prevent anything from worsening. We will see patient back for reevaluation in 1 week here in the clinic. If anything worsens or changes patient will contact our office for additional recommendations. Electronic Signature(s) Signed: 06/04/2021 4:28:30 PM By: Worthy Keeler PA-C Previous Signature: 05/31/2021 4:57:22 PM Version By: Worthy Keeler PA-C Entered By: Worthy Keeler on 06/04/2021 16:28:29 Ralph Peterson ([id number]) -------------------------------------------------------------------------------- SuperBill Details Patient Name: Ralph Peterson Date of  Service: 05/31/2021 Medical Record Number: [id number] Patient Account Number: [id number] Date of Birth/Sex: 09/28/1965 (55 y.o. M) Treating RN: Cornell Barman Primary Care Provider: Vidal Schwalbe Other Clinician: Referring Provider: Vidal Schwalbe Treating Provider/Extender: Skipper Cliche in Treatment: 5 Diagnosis Coding ICD-10 Codes Code Description [phone] Type 2 diabetes mellitus with foot ulcer 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d I48.0 Paroxysmal atrial fibrillation Z79.01 Long term (current) use of anticoagulants I50.42 Chronic combined systolic (congestive) and diastolic (congestive) heart failure N18.6 End stage renal disease Z99.2 Dependence on renal dialysis Facility Procedures CPT4 Code: [id number] Description: 30051 - DEB SUBQ TISSUE 20 SQ CM/< Modifier: Quantity: 1 CPT4 Code: Description: ICD-10 Diagnosis Description E11.621 Type 2 diabetes mellitus with foot ulcer L97.512 Non-pressure chronic ulcer of other part of right foot with fat layer ex Modifier: posed Quantity: Physician Procedures CPT4 Code: 1021117 Description: 11042 - WC PHYS SUBQ TISS 20 SQ CM Modifier: Quantity: 1 CPT4 Code: Description: ICD-10 Diagnosis Description E11.621 Type 2 diabetes mellitus with foot ulcer L97.512 Non-pressure chronic ulcer of other part of right foot with fat layer ex Modifier: posed Quantity: Electronic Signature(s) Signed: 05/31/2021 4:58:09 PM By: Worthy Keeler PA-C Previous Signature: 05/31/2021 4:06:52 PM Version By: Gretta Cool, BSN, RN, CWS, Kim RN, BSN Entered By: Worthy Keeler on 05/31/2021 16:58:09

## 2021-06-07 ENCOUNTER — Encounter: Payer: Medicare PPO | Admitting: Physician Assistant

## 2021-06-07 ENCOUNTER — Other Ambulatory Visit: Payer: Self-pay

## 2021-06-07 DIAGNOSIS — M86671 Other chronic osteomyelitis, right ankle and foot: Secondary | ICD-10-CM | POA: Diagnosis not present

## 2021-06-07 NOTE — Progress Notes (Addendum)
JAHDEN, SCHARA ([id number]) Visit Report for 06/07/2021 Chief Complaint Document Details Patient Name: Ralph Peterson, Ralph Peterson. Date of Service: 06/07/2021 9:00 AM Medical Record Number: [id number] Patient Account Number: [id number] Date of Birth/Sex: 1966/03/09 (55 y.o. 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 M) Treating RN: Cornell Barman Primary Care Provider: Vidal Schwalbe Other Clinician: Referring Provider: Vidal Schwalbe Treating Provider/Extender: Skipper Cliche in Treatment: 6 Debridement Performed for Wound #10 Right,Distal,Plantar Foot Assessment: Performed By: Physician Tommie Sams., PA-C Debridement Type: Debridement Severity of Tissue Pre Debridement: Fat layer exposed Level of Consciousness (Pre- Awake and Alert procedure): Pre-procedure Verification/Time Out Yes - 09:31 Taken: Total Area Debrided (L x W): 1.4 (cm) x 1.5 (cm) = 2.1 (cm) Tissue and other material Viable, Non-Viable, Slough, Subcutaneous, Slough debrided: Level: Skin/Subcutaneous Tissue Debridement Description: Excisional Instrument: Curette Bleeding: Minimum Hemostasis Achieved: Pressure Response to Treatment: Procedure was tolerated well Level of Consciousness  (Post- Awake and Alert procedure): Post Debridement Measurements of Total Wound Length: (cm) 1.4 Width: (cm) 1.5 Depth: (cm) 0.8 Volume: (cm) 1.319 Character of Wound/Ulcer Post Debridement: Stable Severity of Tissue Post Debridement: Fat layer exposed Post Procedure Diagnosis Same as Pre-procedure Electronic Signature(s) Signed: 06/07/2021 5:41:09 PM By: Worthy Keeler PA-C Signed: 06/07/2021 6:31:02 PM By: Gretta Cool, BSN, RN, CWS, Kim RN, BSN Entered By: Gretta Cool, BSN, RN, CWS, Kim on 06/07/2021 09:33:19 Ralph Peterson ([id number]) -------------------------------------------------------------------------------- HPI Details Patient Name: Ralph Peterson. Date of Service: 06/07/2021 9:00 AM Medical Record Number: [id number] Patient Account Number: [id number] Date of Birth/Sex: [birth year] (55 y.o. M) Treating RN: Cornell Barman Primary Care Provider: Vidal Schwalbe Other Clinician: Referring Provider: Vidal Schwalbe Treating Provider/Extender: Skipper Cliche in Treatment: 6 History of Present Illness HPI Description: 08/17/2020 upon evaluation today patient actually appears to be doing decently well today which is good news in regard to the dorsal wound which is almost healed and the lateral wound also doing much better. With regard to the plantar wound this is still draining purulent drainage unfortunately the culture did reveal that he had Pseudomonas as well as Enterococcus. With that being said he is allergic to penicillin which took away the only oral medication for the Enterococcus I did look at linezolid as a possibility but unfortunately there were medication interactions. Also the same was true the Cipro where there were medication interactions. Nonetheless I want to see if we can get him into infectious disease to see if they can work with him to try to find possibly add at home IV antibiotic regimen that can help out at this point. No sharp debridement is can be  necessary today. 09/07/2020 upon evaluation today patient appears to be doing unfortunately not too well in regard to his wound. He has been tolerating the dressing changes without complication. Fortunately there is no sign of active infection at this time. No fevers, chills, nausea, vomiting, or diarrhea. The patient did have a repeat surgery going to clean out the abscess and try to work on getting things  moving in a better direction about a week and a half ago. He still has sutures in place. With that being said unfortunately he does not appear to be showing signs of a lot of improvement in my opinion. He does have sutures on the plantar aspect still in place and on the dorsal aspect he has a small opening which actually probes all the way down through and actually came out of the plantar aspect. I feel like that he really is having a lot of issues here and I am afraid that if we do not get this taken care of this is can end up with him having a much larger and more extensive amputation than what he was hoping to have to deal with with this. It has already been since June 2021 but has been dealing with this including all the time since the amputation. 09/14/2020 upon evaluation today patient appears to be doing really about the same in regard to the wound on the dorsal surface of his foot. The plantar foot unfortunately still has sutures in place I really cannot see how things are doing in that regard. With the dorsal foot however this appears to show signs of still having a small opening in the distal aspect which does actually probe down to bone there is also significant purulent drainage we did actually obtain a wound culture today to further evaluate the issue here. 09/21/2020 upon evaluation today patient appears to be doing about the same in regard to his wound on the dorsal surface of his foot and the plantar foot he still does not have the sutures out at this point. That should hopefully happen  in the next week. With that being said the main issue is he continues to have drainage here. It was noted that he had positive findings on culture for Enterobacter. With that being said he cannot take oral penicillin he could do vancomycin and I think that is good to be the ideal thing this to see if we can get him started on IV vancomycin at dialysis. We will get a get in touch with them to try to see if we can coordinate that being done there at the dialysis center. I think that is the ideal situation at this point I would probably start with just 2 weeks of therapy. 09/28/2020 upon evaluation today patient appears to be doing well with regard to his wounds in general. He also has back on the vancomycin after the culture results and this seems to be doing excellent for him. All the wounds are showing signs of good improvement. No fevers, chills, nausea, vomiting, or diarrhea. 10/05/2020 upon evaluation today patient appears to actually be doing excellent in regard to his foot. Fortunately there is a lot of signs of new granulation epithelization at this point. There does not appear to be any evidence of active infection which is great news and overall very pleased with where things stand. In fact the open wound along the plantar aspect of his foot as well as the dorsal part of his foot has dramatically improved since we went forward with the vancomycin and requested that the dialysis center provide this to him by way of IV antibiotics with his dialysis. Overall I think that has made a dramatic improvement and overall his wound seems to be doing significantly better at this point. No fevers, chills, nausea, vomiting, or diarrhea. The patient was set to have his second opinion appointment with podiatry [facility] next week but to  be perfectly honest I am not even certain that that is necessary at this point I do not think there is much that they would be able to do for him nor that he really would want them to  do considering how well things look at this time. 10/12/2020 on evaluation today patient appears to be doing excellent in regard to his wounds. In fact the only issue I see is that everything is dried out as far as the collagen is concerned. I do think we can try to keep this moist he will continue to make excellent progress as he has been. Overall I am extremely pleased with what I see today. 10/19/2020 upon evaluation today patient appears to be doing decently well in regard to the wounds on his foot he is can require some debridement in this location. Unfortunately is having some issues with blistering over the leg which is of greater concern at this point. Fortunately I do not see any signs of active infection which is great news. Nonetheless the blistering may just be related to venous insufficiency or subsequently could potentially be an issue with overall worsening with regard to an infection but again it is not really hot to touch. 10/26/2020 upon evaluation today patient appears to be doing well with regard to the majority of his wounds. Unfortunately the dorsal surface of his foot does not appear to be doing quite as well today compared to what it has been doing. I do think that we will need to address that currently. Everything else is showing signs of improvement. He did not get the prescription that I gave him previous as unfortunately the transmission failed. I did not know that until today when the patient mention to me that he never got the antibiotic. 11/02/20 upon evaluation today patient appears to be doing much better in regard to his wounds in general. I feel like that he is making good progress and even the top of the foot does not appear to be doing too poorly at this time. He did finally get the antibiotics which is good. Unfortunately it does not look like I am able to electronically sent to his pharmacy it kept being an error in transmission every time this was sent. We finally  decided to call it in. Nonetheless overall I think he is headed in the right direction based on what I am seeing. 11/09/2020 upon evaluation today patient appears to be doing well in general in regard to his wounds. I think that he is making some progress here. With that being said there is definitely still some areas of concern. Specifically the dorsal aspect of his foot distally and the lateral aspect of his foot proximally. Both of these areas are still open everything else appears to likely be closed today based on what I am seeing. 4/28; the patient really looks quite good. The area over the base of the right fifth metatarsal and the base of the fifth MTP both look healed to me. 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 They are using Hydrofera Blue rope but I think this is far too small wound now for this 11/23/2020 upon evaluation today patient actually appears to be doing excellent. In fact I do not really see anything that is actually open anywhere at this point. With that being said I am a little reluctant to completely just healing out  and discharge him based on the dorsal foot wound being closed even though I do not see anything open we have been there down this road before where things would reopen causing additional problems. Nonetheless I think that we need to monitor him a little bit more closely to make sure nothing worsens again. 11/30/2020 upon evaluation today patient actually appears to be doing quite well in regard to his wound at this point. He has been tolerating the dressing changes without complication which is great news and overall I am extremely pleased with where things stand today. No fevers, chills, nausea, vomiting, or diarrhea. Readmission: 04/24/2021 upon evaluation today patient presents for reevaluation here in the clinic this is a patient that I have  previously taken care of when he had surgical wounds that had to be healed and this was several months back he in fact healed I believe in May 2022. Subsequently he has an issue now with his foot as he has been walking around more and this has caused an abnormal pressure location. This again is on the foot where he had all the surgery previous. That is on the right plantar foot. With that being said unfortunately the pati having a significant issue here with pressure ent does seem to be which I think is not can be the easiest thing to manage due to the shape of his foot. He is supposed to be getting new diabetic shoes fitted but right now he still has the ones currently that he has been you been using previously. Otherwise none of his major medical problems have shifted at this point. 10/11; second visit for this man who has a area right plantar foot at roughly the fourth metatarsal head. We have been using Hydrofera Blue and a surgical shoe. 05/08/2021 upon evaluation today patient's wound is actually showing signs of being a little bit larger he has a lot of callus around the edges of the wound that is can have to be cleaned away this is some undermining region. Nonetheless I think if we get that cleared away we can actually go and see about put him in the total contact cast with Dr. Dellia Nims discussed with him last week. 05/10/2021 upon evaluation today patient's wound actually appears to be doing great in fact and just the 2 days since we put the cast on I think he has made even some good progress here. Nonetheless he is here for the obligatory first cast change 2 days after application. No sharp debridement necessary today. 05/17/2021 upon evaluation today patient appears to be doing well with regard to his plantar foot ulcer. I do feel like that there is much less in the way of breakdown and deterioration today as compared to his last visit. And that is even better than the time before which was  when we first put the cast on. Either way I think that we are headed in the right direction though there is bone in the base of the wound just underneath some tissue were not completely exposed but this is something that we need to keep a close eye on. I am concerned about how things are progressing in 1 to make sure that we keep as much pressure off as possible I think the total contact cast is good to be perfect for [place] to continue. 05/24/2021 upon evaluation today patient unfortunately appears to be doing a little bit worse in regard to the plantar foot ulcer it looks better around the edges which is good  the cast definitely was keeping pressure off. Unfortunately the issue is simply that the patient is having more trouble in the center part of the wound diagnosis last week and I was little concerned and then honestly I am not really certain that keeping the cast on is the best way to go. I do think he needs some sharp debridement today. 05/31/2021 upon evaluation today patient's wound actually appears to be doing slightly better he has been doing a good job in my opinion and staying off of this as much as possible. Obviously the more that he can stay off of it the better as far as I am concerned. He voiced understanding and states he is trying to do what he can in that regard. 06/07/2021 upon evaluation today patient appears to be doing well with regard to his wound on his foot. Unfortunately there is some erythema on his leg that has me a little bit concerned. I really think he may need some IV antibiotic therapy. Obviously I think the sooner we can get this going the better as far as that is concerned. I discussed that with him today as well. Otherwise the biggest issue with hyperbaric oxygen therapy is good to be that his ejection fraction on his last echocardiogram was at 20% that is a bit low for [place] to consider HBO therapy. Nonetheless if we get a repeat evaluation he was in a very different  state a year ago when he had that we may actually have a better chance at getting him moving forward with this. Electronic Signature(s) Signed: 06/07/2021 10:14:46 AM By: Worthy Keeler PA-C Previous Signature: 06/07/2021 9:24:49 AM Version By: Worthy Keeler PA-C Entered By: Worthy Keeler on 06/07/2021 10:14:46 Ralph Peterson ([id number]) -------------------------------------------------------------------------------- Physical Exam Details Patient Name: Ralph Peterson Date of Service: 06/07/2021 9:00 AM Medical Record Number: [id number] Patient Account Number: [id number] Date of Birth/Sex: October 26, 1965 (55 y.o. M) Treating RN: Cornell Barman Primary Care Provider: Vidal Schwalbe Other Clinician: Referring Provider: Vidal Schwalbe Treating Provider/Extender: Skipper Cliche in Treatment: 6 Constitutional Well-nourished and well-hydrated in no acute distress. Respiratory normal breathing without difficulty. Psychiatric this patient is able to make decisions and demonstrates good insight into disease process. Alert and Oriented x 3. pleasant and cooperative. Notes Upon inspection patient's wound bed did require sharp debridement to clear away some of the necrotic debris and fortunately he tolerated that debridement today without complication. Post debridement wound bed appears to be doing significantly better which is great news. Electronic Signature(s) Signed: 06/07/2021 10:23:28 AM By: Worthy Keeler PA-C Entered By: Worthy Keeler on 06/07/2021 10:23:27 Ralph Peterson ([id number]) -------------------------------------------------------------------------------- Physician Orders Details Patient Name: Ralph Peterson Date of Service: 06/07/2021 9:00 AM Medical Record Number: [id number] Patient Account Number: [id number] Date of Birth/Sex: May 15, 1966 (55 y.o. 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 Bathing/ Shower/ Hygiene o May shower; gently cleanse wound with antibacterial soap,  rinse and pat dry prior to dressing wounds o No tub bath. Off-Loading o Open toe surgical shoe with peg assist. o Other: - offload insert from [facility] Hyperbaric Oxygen Therapy Wound #10 Right,Distal,Plantar Foot o Evaluate for HBO Therapy o Indication and location: - Right foot osteomyelitis o If appropriate for treatment, begin HBOT per protocol: o 2.0 ATA for 90 Minutes without Air Breaks o One treatment per day (delivered Monday through Friday unless otherwise specified in Special Instructions below): o Total # of Treatments: - 40 o Finger stick Blood Glucose Pre- and Post- HBOT Treatment. o Follow Hyperbaric Oxygen Glycemia Protocol Wound Treatment Wound #10 - Foot Wound Laterality: Plantar, Right, Distal Cleanser: [employer] Ancillary Kit - 15 Day Supply (DME) (Generic) Every Other Day/30 Days Discharge Instructions: Use supplies as instructed; Kit contains: (15) Saline Bullets; (15) 3x3 Gauze; 15 pr Gloves Cleanser: Wound Cleanser (DME) (Generic) Every Other Day/30 Days Discharge Instructions: Wash your hands with soap and water. Remove old dressing, discard into plastic bag and place into trash. Cleanse the wound with Wound  Cleanser prior to applying a clean dressing using gauze sponges, not tissues or cotton balls. Do not scrub or use excessive force. Pat dry using gauze sponges, not tissue or cotton balls. Primary Dressing: Hydrofera Blue Ready Transfer Foam, 2.5x2.5 (in/in) (DME) (Generic) Every Other Day/30 Days Discharge Instructions: Apply Hydrofera Blue Ready to wound bed as directed Secondary Dressing: Conforming Guaze Roll-Medium Every Other Day/30 Days Discharge Instructions: Apply Conforming Stretch Guaze Bandage as directed Consults o Cardiology - Evaluate for Hyperbaric Oxygen Therapy (Ejection Fraction) Ralph Peterson, Ralph Peterson ([id number]) Radiology o X-ray, Chest - 2-view for Hyperbaric Oxygen Therapy Patient Medications Allergies: Percocet, penicillin Notifications Medication Indication Start End vancomycin osteomyelitis 06/07/2021 DOSE intravenous 1,000 mg recon soln - recon soln intravenous (administer at dialysis) cefepime DOSE intravenous 1 gram recon soln - recon soln intravenous GLYCEMIA INTERVENTIONS PROTOCOL PRE-HBO GLYCEMIA INTERVENTIONS ACTION INTERVENTION Obtain pre-HBO capillary blood glucose (ensure 1 physician order is in chart). A. Notify HBO physician and await physician orders. 2 If result is 70 mg/dl or below: B. If the result meets the hospital definition of a critical result, follow hospital policy. A. Give patient an 8 ounce Glucerna Shake, an 8 ounce Ensure, or 8 ounces of a Glucerna/Ensure equivalent dietary supplement*. B. Wait 30 minutes. If result is 71 mg/dl to 130 mg/dl: C. Retest patientos capillary blood glucose (CBG). D. If result greater than or equal to 110 mg/dl, proceed with HBO. If result less than 110 mg/dl, notify HBO physician and consider holding HBO. If result is 131 mg/dl to 249 mg/dl: A. Proceed with HBO. A. Notify HBO physician and await physician orders. B. It is recommended to hold HBO and do If result is 250 mg/dl or  greater: blood/urine ketone testing. C. If the result meets the hospital definition of a critical result, follow hospital policy. POST-HBO GLYCEMIA INTERVENTIONS ACTION INTERVENTION Obtain post HBO capillary blood glucose (ensure 1 physician order is in chart). A. Notify HBO physician and await physician orders. 2 If result is 70 mg/dl or below: B. If the result meets the hospital definition of a critical result, follow hospital policy. A. Give patient an 8 ounce Glucerna Shake, an 8 ounce Ensure, or 8 ounces of a Glucerna/Ensure equivalent dietary supplement*. B. Wait 15 minutes for symptoms of hypoglycemia (i.e. nervousness, anxiety, If result is 71 mg/dl to 100 mg/dl: sweating, chills, clamminess, irritability, confusion, tachycardia or dizziness). C. If patient asymptomatic, discharge patient. If patient symptomatic, repeat  capillary blood glucose (CBG) and notify HBO physician. If result is 101 mg/dl to 249 mg/dl: A. Discharge patient. A. Notify HBO physician and await physician orders. B. It is recommended to do blood/urine If result is 250 mg/dl or greater: ketone testing. C. If the result meets the hospital definition of a critical result, follow hospital policy. *Juice or candies are NOT equivalent products. If patient refuses the Glucerna or Ensure, please consult the hospital dietitian for an appropriate substitute. Ralph Peterson, Ralph Peterson ([id number]) Electronic Signature(s) Signed: 06/07/2021 5:41:09 PM By: Worthy Keeler PA-C Signed: 06/07/2021 6:31:02 PM By: Gretta Cool, BSN, RN, CWS, Kim RN, BSN Entered By: Gretta Cool, BSN, RN, CWS, Kim on 06/07/2021 11:23:15 Ralph Peterson, Ralph Peterson ([id number]) -------------------------------------------------------------------------------- Prescription 06/07/2021 Patient Name: Ralph Peterson, Ralph Peterson. Provider: Jeri Cos PA-C Date of Birth: [birth year] NPI#: [id number] Sex: Jerilynn Mages DEA#: [id number] Phone #: [phone] License  #: Patient Address: [facility] [place], [place] [address] [address], [place], [place] [address] [phone] Allergies Percocet; penicillin Medication Medication: Route: Strength: Form: vancomycin intravenous 1,000 mg recon soln Class: VANCOMYCIN ANTIBIOTICS AND DERIVATIVES Dose: Frequency / Time: Indication: recon soln intravenous (administer at dialysis) osteomyelitis Number of Refills: Number of Units: 0 Generic Substitution: Start Date: End Date: Administered at Facility: Substitution Permitted 28/36/6294 No Note to Pharmacy: Hand Signature: Date(s): Ralph Peterson, Ralph Peterson ([id number]) Prescription 06/07/2021 Patient Name: Ralph Peterson, Ralph Peterson. Provider: Jeri Cos PA-C Date of Birth: 29-Jun-1966 NPI#: [id number] Sex: Jerilynn Mages DEA#: [id number] Phone #: [phone] License #: Patient Address: [facility] [place], [place] [id number] [address] [place], [place] [address] [phone] Allergies Percocet; penicillin Medication Medication: Route: Strength: Form: cefepime intravenous 1 gram recon soln Class: CEPHALOSPORIN ANTIBIOTICS - 4TH GENERATION Dose: Frequency / Time: Indication: recon soln intravenous Number of Refills: Number of Units: 0 Generic Substitution: Start Date: End Date: Administered at Facility: Substitution Permitted No Note to Pharmacy: Hand Signature: Date(s): Electronic Signature(s) Signed: 06/07/2021 5:41:09 PM By: Worthy Keeler PA-C Signed: 06/07/2021 6:31:02 PM By: Gretta Cool, BSN, RN, CWS, Kim RN, BSN Entered By: Gretta Cool, BSN, RN, CWS, Kim on 06/07/2021 11:23:16 Ralph Peterson ([id number]) --------------------------------------------------------------------------------  Problem List Details Patient Name: Ralph Peterson. Date of Service:  06/07/2021 9:00 AM Medical Record Number: [id number] Patient Account Number: [id number] Date of Birth/Sex: 19-Jun-1966 (55 y.o. M) Treating RN: Cornell Barman Primary Care Provider: Vidal Schwalbe Other Clinician: Referring Provider: Vidal Schwalbe Treating Provider/Extender: Skipper Cliche in Treatment: 6 Active Problems ICD-10 Encounter Code Description Active Date MDM Diagnosis [phone] Other chronic osteomyelitis, right ankle and foot 05/31/2021 No Yes E11.621 Type 2 diabetes mellitus with foot ulcer 04/24/2021 No Yes L97.512 Non-pressure chronic ulcer of other part of right foot with fat layer 04/24/2021 No Yes exposed I48.0 Paroxysmal atrial fibrillation 04/24/2021 No Yes Z79.01 Long term (current) use of anticoagulants 04/24/2021 No Yes I50.42 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 With regard to the plantar wound this is still draining purulent drainage unfortunately the culture did reveal that he had Pseudomonas as well as Enterococcus. With that being said he is allergic to penicillin which took away the only oral medication for the Enterococcus I did look at linezolid as a possibility but unfortunately there were medication interactions. Also the same was true the Cipro where there were medication interactions. Nonetheless I want to see if we can get him into infectious disease to see if they can work with him to try to find possibly add at home IV antibiotic regimen that can help out at this point. No sharp debridement is can be necessary today. 09/07/2020 upon evaluation today patient appears to be doing unfortunately not too well in regard to his wound. He has been tolerating the dressing changes without complication. Fortunately there is no sign of active infection at this time. No fevers, chills, nausea, vomiting, or diarrhea. The patient did have a repeat surgery going to clean out the abscess and try to work on getting things moving in a better direction about a week and a half ago. He still has sutures in place. With that being said unfortunately he does not appear to be showing signs of a lot of improvement in my opinion. He does have sutures on the plantar aspect still in place and on the dorsal aspect he has a small opening which actually probes all the way down through and actually came out of the plantar aspect. I feel like that he really is having a lot of issues here and I am afraid that if we do not get this taken care of this is can end up with him having a much larger and more extensive amputation than what he was hoping to have to deal with with this. It has already been since June 2021 but has been dealing with this including all the time since the  amputation. 09/14/2020 upon evaluation today patient appears to be doing really about the same in regard to the wound on the dorsal surface of his foot. The plantar foot unfortunately still has sutures in place I really cannot see how things are doing in that regard. With the dorsal foot however this appears to show signs of still having a small opening in the distal aspect which does actually probe down to bone there is also significant purulent drainage we did actually obtain a wound culture today to further evaluate the issue here. 09/21/2020 upon evaluation today patient appears to be doing about the same in regard to his wound on the dorsal surface of his foot and the plantar foot he still does not have the sutures out at this point. That should hopefully happen in the next week. With that being said the main issue is he continues to have drainage here. It was noted that he had positive findings on culture for Enterobacter. With that being said he cannot take oral penicillin he could do vancomycin and I think that is good to be the ideal thing this to see if  we can get him started on IV vancomycin at dialysis. We will get a get in touch with them to try to see if we can coordinate that being done there at the dialysis center. I think that is the ideal situation at this point I would probably start with just 2 weeks of therapy. 09/28/2020 upon evaluation today patient appears to be doing well with regard to his wounds in general. He also has back on the vancomycin after the culture results and this seems to be doing excellent for him. All the wounds are showing signs of good improvement. No fevers, chills, nausea, vomiting, or diarrhea. 10/05/2020 upon evaluation today patient appears to actually be doing excellent in regard to his foot. Fortunately there is a lot of signs of new granulation epithelization at this point. There does not appear to be any evidence of active infection which is great news  and overall very pleased with where things stand. In fact the open wound along the plantar aspect of his foot as well as the dorsal part of his foot has dramatically improved since we went forward with the vancomycin and requested that the dialysis center provide this to him by way of IV antibiotics with his dialysis. Overall I think that has made a dramatic improvement and overall his wound seems to be doing significantly better at this point. No fevers, chills, nausea, vomiting, or diarrhea. The patient was set to have his second opinion appointment with podiatry [facility] next week but to be perfectly honest I am not even certain that that is necessary at this point I do not think there is much that they would be able to do for him nor that he really would want them to do considering how well things look at this time. 10/12/2020 on evaluation today patient appears to be doing excellent in regard to his wounds. In fact the only issue I see is that everything is dried out as far as the collagen is concerned. I do think we can try to keep this moist he will continue to make excellent progress as he has been. Overall I am extremely pleased with what I see today. 10/19/2020 upon evaluation today patient appears to be doing decently well in regard to the wounds on his foot he is can require some debridement in this location. Unfortunately is having some issues with blistering over the leg which is of greater concern at this point. Fortunately I do not see any signs of active infection which is great news. Nonetheless the blistering may just be related to venous insufficiency or subsequently could potentially be an issue with overall worsening with regard to an infection but again it is not really hot to touch. 10/26/2020 upon evaluation today patient appears to be doing well with regard to the majority of his wounds. Unfortunately the dorsal surface of his foot does not appear to be doing quite as well today  compared to what it has been doing. I do think that we will need to address that currently. Everything else is showing signs of improvement. He did not get the prescription that I gave him previous as unfortunately the transmission failed. I did not know that until today when the patient mention to me that he never got the antibiotic. 11/02/20 upon evaluation today patient appears to be doing much better in regard to his wounds in general. I feel like that he is making good progress and even the top of the foot does not appear to be  doing too poorly at this time. He did finally get the antibiotics which is good. Unfortunately it does not look like I am able to electronically sent to his pharmacy it kept being an error in transmission every time this was sent. We finally decided to call it in. Nonetheless overall I think he is headed in the right direction based on what I am seeing. Ralph Peterson, Ralph Peterson ([id number]) 11/09/2020 upon evaluation today patient appears to be doing well in general in regard to his wounds. I think that he is making some progress here. With that being said there is definitely still some areas of concern. Specifically the dorsal aspect of his foot distally and the lateral aspect of his foot proximally. Both of these areas are still open everything else appears to likely be closed today based on what I am seeing. 4/28; the patient really looks quite good. The area over the base of the right fifth metatarsal and the base of the fifth MTP both look healed to me. 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 They are using Hydrofera Blue rope but I think this is far too small wound now for this 11/23/2020 upon evaluation today patient actually appears to be doing excellent. In fact I do not really see anything that is actually open anywhere at this point. With that being said I am a little reluctant to  completely just healing out and discharge him based on the dorsal foot wound being closed even though I do not see anything open we have been there down this road before where things would reopen causing additional problems. Nonetheless I think that we need to monitor him a little bit more closely to make sure nothing worsens again. 11/30/2020 upon evaluation today patient actually appears to be doing quite well in regard to his wound at this point. He has been tolerating the dressing changes without complication which is great news and overall I am extremely pleased with where things stand today. No fevers, chills, nausea, vomiting, or diarrhea. Readmission: 04/24/2021 upon evaluation today patient presents for reevaluation here in the clinic this is a patient that I have previously taken care of when he had surgical wounds that had to be healed and this was several months back he in fact healed I believe in May 2022. Subsequently he has an issue now with his foot as he has been walking around more and this has caused an abnormal pressure location. This again is on the foot where he had all the surgery previous. That is on the right plantar foot. With that being said unfortunately the pati having a significant issue here with pressure ent does seem to be which I think is not can be the easiest thing to manage due to the shape of his foot. He is supposed to be getting new diabetic shoes fitted but right now he still has the ones currently that he has been you been using previously. Otherwise none of his major medical problems have shifted at this point. 10/11; second visit for this man who has a area right plantar foot at roughly the fourth metatarsal head. We have been using Hydrofera Blue and a surgical shoe. 05/08/2021 upon evaluation today patient's wound is actually showing signs of being a little bit larger he has a lot of callus around the edges of the wound that is can have to be cleaned away  this is  some undermining region. Nonetheless I think if we get that cleared away we can actually go and see about put him in the total contact cast with Dr. Dellia Nims discussed with him last week. 05/10/2021 upon evaluation today patient's wound actually appears to be doing great in fact and just the 2 days since we put the cast on I think he has made even some good progress here. Nonetheless he is here for the obligatory first cast change 2 days after application. No sharp debridement necessary today. 05/17/2021 upon evaluation today patient appears to be doing well with regard to his plantar foot ulcer. I do feel like that there is much less in the way of breakdown and deterioration today as compared to his last visit. And that is even better than the time before which was when we first put the cast on. Either way I think that we are headed in the right direction though there is bone in the base of the wound just underneath some tissue were not completely exposed but this is something that we need to keep a close eye on. I am concerned about how things are progressing in 1 to make sure that we keep as much pressure off as possible I think the total contact cast is good to be perfect for [place] to continue. 05/24/2021 upon evaluation today patient unfortunately appears to be doing a little bit worse in regard to the plantar foot ulcer it looks better around the edges which is good the cast definitely was keeping pressure off. Unfortunately the issue is simply that the patient is having more trouble in the center part of the wound diagnosis last week and I was little concerned and then honestly I am not really certain that keeping the cast on is the best way to go. I do think he needs some sharp debridement today. 05/31/2021 upon evaluation today patient's wound actually appears to be doing slightly better he has been doing a good job in my opinion and staying off of this as much as possible. Obviously the  more that he can stay off of it the better as far as I am concerned. He voiced understanding and states he is trying to do what he can in that regard. 06/07/2021 upon evaluation today patient appears to be doing well with regard to his wound on his foot. Unfortunately there is some erythema on his leg that has me a little bit concerned. I really think he may need some IV antibiotic therapy. Obviously I think the sooner we can get this going the better as far as that is concerned. I discussed that with him today as well. Otherwise the biggest issue with hyperbaric oxygen therapy is good to be that his ejection fraction on his last echocardiogram was at 20% that is a bit low for [place] to consider HBO therapy. Nonetheless if we get a repeat evaluation he was in a very different state a year ago when he had that we may actually have a better chance at getting him moving forward with this. Objective Constitutional Well-nourished and well-hydrated in no acute distress. Vitals Time Taken: 9:12 AM, Height: 72 in, Weight: 219 lbs, BMI: 29.7, Temperature: 98.3 F, Pulse: 88 bpm, Respiratory Rate: 16 breaths/min, Blood Pressure: 135/86 mmHg. Respiratory normal breathing without difficulty. Ralph Peterson, Ralph Peterson ([id number]) Psychiatric this patient is able to make decisions and demonstrates good insight into disease process. Alert and Oriented x 3. pleasant and cooperative. General Notes: Upon inspection patient's wound bed did require  sharp debridement to clear away some of the necrotic debris and fortunately he tolerated that debridement today without complication. Post debridement wound bed appears to be doing significantly better which is great news. Integumentary (Hair, Skin) Wound #10 status is Open. Original cause of wound was Blister. The date acquired was: 04/10/2021. The wound has been in treatment 6 weeks. The wound is located on the [place]. The wound measures 1.4cm length x  1.5cm width x 0.7cm depth; 1.649cm^2 area and 1.155cm^3 volume. There is tendon and Fat Layer (Subcutaneous Tissue) exposed. There is no tunneling noted, however, there is undermining starting at 1:00 and ending at 4:00 with a maximum distance of 0.4cm. There is a medium amount of serosanguineous drainage noted. The wound margin is flat and intact. There is medium (34-66%) pink, hyper - granulation within the wound bed. There is a medium (34-66%) amount of necrotic tissue within the wound bed including Adherent Slough. 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 There was a Excisional Skin/Subcutaneous Tissue Debridement with a total area of 2.1 sq cm performed by Tommie Sams., PA-C. With the following instrument(s): Curette to remove Viable and Non-Viable tissue/material. Material removed includes Subcutaneous Tissue and Slough and. No specimens were taken. A time out was conducted at 09:31, prior to the start of the procedure. A Minimum amount of bleeding was controlled with Pressure. The procedure was tolerated well. Post Debridement Measurements: 1.4cm length x 1.5cm width x 0.8cm depth; 1.319cm^3 volume. Character of Wound/Ulcer Post Debridement is stable. Severity of Tissue Post Debridement is: Fat layer exposed. Post procedure Diagnosis Wound #10: Same as Pre-Procedure Plan Follow-up Appointments: Return Appointment in  1 week. Bathing/ Shower/ Hygiene: May shower; gently cleanse wound with antibacterial soap, rinse and pat dry prior to dressing wounds No tub bath. Off-Loading: Open toe surgical shoe with peg assist. Other: - offload insert from [facility] Hyperbaric Oxygen Therapy: Wound #10 Right,Distal,Plantar Foot: Evaluate for HBO Therapy Indication and location: - Right foot osteomyelitis If appropriate for treatment, begin HBOT per protocol: 2.0 ATA for 90 Minutes without Air Breaks One treatment per day (delivered Monday through Friday unless otherwise specified in Special Instructions below): Total # of Treatments: - 40 Finger stick Blood Glucose Pre- and Post- HBOT Treatment. Follow Hyperbaric Oxygen Glycemia Protocol Ralph Peterson, Ralph Peterson ([id number]) Consults ordered were: Cardiology - Evaluate for Hyperbaric Oxygen Therapy (Ejection Fraction) Radiology ordered were: X-ray, Chest - 2-view for Hyperbaric Oxygen Therapy The following medication(s) was prescribed: vancomycin intravenous 1,000 mg recon soln recon soln intravenous (administer at dialysis) for osteomyelitis starting 06/01/2021 cefepime intravenous 1 gram recon soln recon soln intravenous WOUND #10: - Foot Wound Laterality: Plantar, Right, Distal Cleanser: [place] Ancillary Kit - 15 Day Supply (DME) (Generic) Every Other Day/30 Days Discharge Instructions: Use supplies as instructed; Kit contains: (15) Saline Bullets; (15) 3x3 Gauze; 15 pr Gloves Cleanser: Wound Cleanser (DME) (Generic) Every Other Day/30 Days Discharge Instructions: Wash your hands with soap and water. Remove old dressing, discard into plastic bag and place into trash. Cleanse the wound  with Wound Cleanser prior to applying a clean dressing using gauze sponges, not tissues or cotton balls. Do not scrub or use excessive force. Pat dry using gauze sponges, not tissue or cotton balls. Primary Dressing: Hydrofera Blue Ready Transfer Foam, 2.5x2.5 (in/in) (DME)  (Generic) Every Other Day/30 Days Discharge Instructions: Apply Hydrofera Blue Ready to wound bed as directed Secondary Dressing: Conforming Guaze Roll-Medium Every Other Day/30 Days Discharge Instructions: [place] as directed 1. Based on what I am seeing currently I do think he would benefit from getting started on IV antibiotics he is about to wrap up with the doxycycline that was previously given. Subsequently I think IV vancomycin and cefepime would be ideal here since we do not know exactly the organism we are dealing with this will counter cover the bases so to speak. 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 2. I am also can recommend that we have the patient actually continue with the [facility] dressing which actually seems to be doing quite well also think she is doing well staying off of this which has been of benefit 2. 3. I am also going to recommend that we have the patient continue with the roll gauze to secure in place I think that this is doing a great job but currently and provides a little padding as well she is also using the offloading shoe. 4. I am also can recommend that we go ahead and see about sending the patient back to his cardiologist, Dr. Nehemiah Massed to see if they can repeat his echocardiogram. He is a candidate for hyperbaric oxygen therapy and this could definitely benefit him from the standpoint of the osteomyelitis and trying to salvage his right foot. Nonetheless the biggest issue here is that his previous ejection fraction was around 20% this was October 2021. If that still the case he would not qualify for hyperbarics. However if his ejection fraction has improved then he could potentially be considered. I also want the opinion of Dr. Nehemiah Massed on whether this patient would be appropriate in his opinion for hyperbarics or not. Again as I explained to the patient hyperbarics essentially  mimics and is similar to exercise and in some cases if the ejection fraction is too significantly dampened this will not allow for this treatment. We will see patient back for reevaluation in 1 week here in the clinic. If anything worsens or changes patient will contact our office for additional recommendations. Electronic Signature(s) Signed: 06/07/2021 5:30:40 PM By: Worthy Keeler PA-C Previous Signature: 06/07/2021 5:30:11 PM Version By: Worthy Keeler PA-C Previous Signature: 06/07/2021 10:24:39 AM Version By: Worthy Keeler PA-C Entered By: Worthy Keeler on 06/07/2021 17:30:40 Ralph Peterson ([id number]) -------------------------------------------------------------------------------- SuperBill Details Patient Name: Ralph Peterson Date of Service: 06/07/2021 Medical Record Number: [id number] Patient Account Number: [id number] Date of Birth/Sex: 1965-09-23 (55 y.o. M) Treating RN: Cornell Barman Primary Care Provider: Vidal Schwalbe Other Clinician: Referring Provider: Vidal Schwalbe Treating Provider/Extender: Skipper Cliche in Treatment: 6 Diagnosis Coding ICD-10 Codes Code Description [phone] Other chronic osteomyelitis, right ankle and foot E11.621 Type 2 diabetes mellitus with foot ulcer 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d I48.0 Paroxysmal atrial fibrillation Z79.01 Long term (current) use of anticoagulants I50.42 Chronic combined systolic (congestive) and diastolic (congestive) heart failure N18.6 End stage renal disease Z99.2 Dependence on renal dialysis Facility Procedures CPT4 Code: [id number] Description: [id number] -  WOUND CARE VISIT-LEV 1 EST PT Modifier: 25 Quantity: 1 CPT4 Code: [id number] Description: 50757 - DEB SUBQ TISSUE 20 SQ CM/< Modifier: Quantity: 1 CPT4 Code: Description: ICD-10 Diagnosis Description 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, right ankle and foot E11.621 Type 2 diabetes mellitus  with foot ulcer Modifier: Quantity: Physician Procedures CPT4 Code: 3225672 Description: 09198 - WC PHYS LEVEL 4 - EST PT Modifier: 25 Quantity: 1 CPT4 Code: Description: ICD-10 Diagnosis Description 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, 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

## 2021-06-08 ENCOUNTER — Other Ambulatory Visit: Payer: Self-pay | Admitting: Physician Assistant

## 2021-06-08 DIAGNOSIS — Z9289 Personal history of other medical treatment: Secondary | ICD-10-CM

## 2021-06-08 NOTE — Progress Notes (Addendum)
Ralph Peterson, Ralph Peterson ([id number]) Visit Report for 06/07/2021 Arrival Information Details Patient Name: Ralph Peterson, Ralph Peterson. Date of Service: 06/07/2021 9:00 AM Medical Record Number: [id number] Patient Account Number: [id number] Date of Birth/Sex: 12-05-1965 (55 y.o. M) Treating RN: Cornell Barman Primary Care Jazzmyne Rasnick: Vidal Schwalbe Other Clinician: Referring Danean Marner: Vidal Schwalbe Treating Dawit Tankard/Extender: Skipper Cliche in Treatment: 6 Visit Information History Since Last Visit Added or deleted any medications: No Patient Arrived: Ambulatory Has Dressing in Place as Prescribed: Yes Arrival Time: 09:10 Pain Present Now: No Accompanied By: friend Transfer Assistance: None Patient Identification Verified: Yes Secondary Verification Process Completed: Yes Patient Has Alerts: Yes Patient Alerts: Patient on Blood Thinner Eliquis Diabetic AVVS 07/2020 ABI LandR=[place] Electronic Signature(s) Signed: 06/07/2021 6:31:02 PM By: Gretta Cool, BSN, RN, CWS, Kim RN, BSN Entered By: Gretta Cool, BSN, RN, CWS, Kim on 06/07/2021 09:12:37 Ralph Peterson ([id number]) -------------------------------------------------------------------------------- Clinic Level of Care Assessment Details Patient Name: Ralph Peterson. Date of Service: 06/07/2021 9:00 AM Medical Record Number: [id number] Patient Account Number: [id number] Date of Birth/Sex: [birth year] (55 y.o. M) Treating RN: Cornell Barman Primary Care Osamah Schmader: Vidal Schwalbe Other Clinician: Referring Yetunde Leis: Vidal Schwalbe Treating Dasani Crear/Extender: Skipper Cliche in Treatment: 6 Clinic Level of Care Assessment Items TOOL 3 Quantity Score []  - Use when EandM and Procedure is performed on FOLLOW-UP visit 0 ASSESSMENTS - Nursing Assessment / Reassessment []  - Reassessment of Co-morbidities (includes updates in patient status) 0 []  - 0 Reassessment of Adherence to Treatment Plan ASSESSMENTS - Wound and Skin Assessment /  Reassessment []  - Points for Wound Assessment can only be taken for a new wound of unknown or different etiology and a 0 procedure is NOT performed to that wound []  - 0 Simple Wound Assessment / Reassessment - one wound []  - 0 Complex Wound Assessment / Reassessment - multiple wounds []  - 0 Dermatologic / Skin Assessment (not related to wound area) ASSESSMENTS - Focused Assessment []  - Circumferential Edema Measurements - multi extremities 0 []  - 0 Nutritional Assessment / Counseling / Intervention []  - 0 Lower Extremity Assessment (monofilament, tuning fork, pulses) []  - 0 Peripheral Arterial Disease Assessment (using hand held doppler) ASSESSMENTS - Ostomy and/or Continence Assessment and Care []  - Incontinence Assessment and Management 0 []  - 0 Ostomy Care Assessment and Management (repouching, etc.) PROCESS - Coordination of Care []  - Points for Discharge Coordination can only be taken for a new wound of unknown or different etiology and a 0 procedure is NOT performed to that wound []  - 0 Simple Patient / Family Education for ongoing care X- 1 20 Complex (extensive) Patient / Family Education for ongoing care X- 1 10 Staff obtains Programmer, systems, Records, Test Results / Process Orders []  - 0 Staff telephones HHA, Nursing Homes / Clarify orders / etc []  - 0 Routine Transfer to another Facility (non-emergent condition) []  - 0 Routine Hospital Admission (non-emergent condition) []  - 0 New Admissions / Biomedical engineer / Ordering NPWT, Apligraf, etc. []  - 0 Emergency Hospital Admission (emergent condition) []  - 0 Simple Discharge Coordination []  - 0 Complex (extensive) Discharge Coordination PROCESS - Special Needs []  - Pediatric / Minor Patient Management 0 []  - 0 Isolation Patient Management []  - 0 Hearing / Language / Visual special needs []  - 0 Assessment of Community assistance (transportation, D/C planning, etc.) Amenta, Borden S. ([id number]) []  -  0 Additional assistance / Altered mentation []  - 0 Support Surface(s) Assessment (bed, cushion, seat, etc.) INTERVENTIONS - Wound Cleansing / Measurement []  - Points for Wound  Cleaning / Measurement, Wound Dressing, Specimen Collection and Specimen taken to lab 0 can only be taken for a new wound of unknown or different etiology and a procedure is NOT performed to that wound []  - 0 Simple Wound Cleansing - one wound []  - 0 Complex Wound Cleansing - multiple wounds []  - 0 Wound Imaging (photographs - any number of wounds) []  - 0 Wound Tracing (instead of photographs) []  - 0 Simple Wound Measurement - one wound []  - 0 Complex Wound Measurement - multiple wounds INTERVENTIONS - Wound Dressings []  - Small Wound Dressing one or multiple wounds 0 []  - 0 Medium Wound Dressing one or multiple wounds []  - 0 Large Wound Dressing one or multiple wounds INTERVENTIONS - Miscellaneous []  - External ear exam 0 []  - 0 Specimen Collection (cultures, biopsies, blood, body fluids, etc.) []  - 0 Specimen(s) / Culture(s) sent or taken to Lab for analysis []  - 0 Patient Transfer (multiple staff / Civil Service fast streamer / Similar devices) []  - 0 Simple Staple / Suture removal (25 or less) []  - 0 Complex Staple / Suture removal (26 or more) []  - 0 Hypo / Hyperglycemic Management (close monitor of Blood Glucose) []  - 0 Ankle / Brachial Index (ABI) - do not check if billed separately []  - 0 Vital Signs Has the patient been seen at the hospital within the last three years: Yes Total Score: 30 Level Of Care: New/Established - Level 1 Electronic Signature(s) Signed: 06/07/2021 6:31:02 PM By: Gretta Cool, BSN, RN, CWS, Kim RN, BSN Entered By: Gretta Cool, BSN, RN, CWS, Kim on 06/07/2021 09:54:13 Ralph Peterson ([id number]) -------------------------------------------------------------------------------- Encounter Discharge Information Details Patient Name: Ralph Peterson. Date of Service: 06/07/2021  9:00 AM Medical Record Number: [id number] Patient Account Number: [id number] Date of Birth/Sex: [birth year] (55 y.o. M) Treating RN: Cornell Barman Primary Care Lani Mendiola: Vidal Schwalbe Other Clinician: Referring Blaine Hari: Vidal Schwalbe Treating Vinia Jemmott/Extender: Skipper Cliche in Treatment: 6 Encounter Discharge Information Items Post Procedure Vitals Discharge Condition: Stable Temperature (F): 98.3 Ambulatory Status: Ambulatory Pulse (bpm): 88 Discharge Destination: Home Respiratory Rate (breaths/min): 16 Transportation: Private Auto Blood Pressure (mmHg): 132/78 Accompanied By: Levada Dy, friend Schedule Follow-up Appointment: Yes Clinical Summary of Care: Electronic Signature(s) Signed: 06/07/2021 6:31:02 PM By: Gretta Cool, BSN, RN, CWS, Kim RN, BSN Entered By: Gretta Cool, BSN, RN, CWS, Kim on 06/07/2021 09:57:28 Ralph Peterson) -------------------------------------------------------------------------------- Lower Extremity Assessment Details Patient Name: Ralph Peterson. Date of Service: 06/07/2021 9:00 AM Medical Record Number: [id number] Patient Account Number: [id number] Date of Birth/Sex: [birth year] (55 y.o. M) Treating RN: Cornell Barman Primary Care Catrell Morrone: Vidal Schwalbe Other Clinician: Referring Tijana Walder: Vidal Schwalbe Treating Kylea Berrong/Extender: Skipper Cliche in Treatment: 6 Vascular Assessment Pulses: Dorsalis Pedis Palpable: [Right:Yes] Electronic Signature(s) Signed: 06/07/2021 6:31:02 PM By: Gretta Cool, BSN, RN, CWS, Kim RN, BSN Entered By: Gretta Cool, BSN, RN, CWS, Kim on 06/07/2021 09:20:56 Ralph Peterson ([id number]) -------------------------------------------------------------------------------- Multi Wound Chart Details Patient Name: Ralph Peterson Date of Service: 06/07/2021 9:00 AM Medical Record Number: [id number] Patient Account Number: [id number] Date of Birth/Sex: Sep 01, 1965 (55 y.o. M) Treating RN: Cornell Barman Primary  Care Chantae Soo: Vidal Schwalbe Other Clinician: Referring Malcom Selmer: Vidal Schwalbe Treating Corlis Angelica/Extender: Skipper Cliche in Treatment: 6 Vital Signs Height(in): 72 Pulse(bpm): 88 Weight(lbs): 219 Blood Pressure(mmHg): 135/86 Body Mass Index(BMI): 30 Temperature(F): 98.3 Respiratory Rate(breaths/min): 16 Photos: [N/A:N/A] Wound Location: Right, Distal, Plantar Foot N/A N/A Wounding Event: Blister N/A N/A Primary Etiology: Diabetic Wound/Ulcer of the Lower N/A N/A Extremity Comorbid History: Arrhythmia, Congestive Heart N/A N/A  Failure, Coronary Artery Disease, Hypertension, Myocardial Infarction, Peripheral Venous Disease, Type II Diabetes, Neuropathy Date Acquired: 04/10/2021 N/A N/A Weeks of Treatment: 6 N/A N/A Wound Status: Open N/A N/A Measurements L x W x D (cm) 1.4x1.5x0.7 N/A N/A Area (cm) : 1.649 N/A N/A Volume (cm) : 1.155 N/A N/A % Reduction in Area: -162.60% N/A N/A % Reduction in Volume: -816.70% N/A N/A Starting Position 1 (o'clock): 1 Ending Position 1 (o'clock): 4 Maximum Distance 1 (cm): 0.4 Undermining: Yes N/A N/A Classification: Grade 3 N/A N/A Exudate Amount: Medium N/A N/A Exudate Type: Serosanguineous N/A N/A Exudate Color: red, brown N/A N/A Wound Margin: Flat and Intact N/A N/A Granulation Amount: Medium (34-66%) N/A N/A Granulation Quality: Pink, Hyper-granulation N/A N/A Necrotic Amount: Medium (34-66%) N/A N/A Exposed Structures: Fat Layer (Subcutaneous Tissue): N/A N/A Yes Tendon: Yes Fascia: No Muscle: No Joint: No Bone: No Epithelialization: None N/A N/A Debridement: Debridement - Excisional N/A N/A Pre-procedure Verification/Time 09:31 N/A N/A Out Taken: Tissue Debrided: Subcutaneous, Slough N/A N/A Level: Skin/Subcutaneous Tissue N/A N/A Debridement Area (sq cm): 2.1 N/A N/A Ralph Peterson, Ralph ([id number]) Instrument: Curette N/A N/A Bleeding: Minimum N/A N/A Hemostasis Achieved: Pressure N/A N/A Debridement  Treatment Procedure was tolerated well N/A N/A Response: Post Debridement 1.4x1.5x0.8 N/A N/A Measurements L x W x D (cm) Post Debridement Volume: 1.319 N/A N/A (cm) Procedures Performed: Debridement N/A N/A Treatment Notes Electronic Signature(s) Signed: 06/07/2021 6:31:02 PM By: Gretta Cool, BSN, RN, CWS, Kim RN, BSN Entered By: Gretta Cool, BSN, RN, CWS, Kim on 06/07/2021 09:52:01 Ralph Peterson ([id number]) -------------------------------------------------------------------------------- Multi-Disciplinary Care Plan Details Patient Name: Ralph Peterson Date of Service: 06/07/2021 9:00 AM Medical Record Number: [id number] Patient Account Number: [id number] Date of Birth/Sex: [birth year] (55 y.o. M) Treating RN: Cornell Barman Primary Care Anand Tejada: Vidal Schwalbe Other Clinician: Referring Keyly Baldonado: Vidal Schwalbe Treating Krithi Bray/Extender: Skipper Cliche in Treatment: 6 Active Inactive Electronic Signature(s) Signed: 06/19/2021 4:53:38 PM By: Gretta Cool, BSN, RN, CWS, Kim RN, BSN Previous Signature: 06/07/2021 6:31:02 PM Version By: Gretta Cool, BSN, RN, CWS, Kim RN, BSN Entered By: Gretta Cool, BSN, RN, CWS, Kim on 06/16/2021 16:53:38 Ralph Peterson ([id number]) -------------------------------------------------------------------------------- Pain Assessment Details Patient Name: Ralph Peterson, Ralph. Date of Service: 06/07/2021 9:00 AM Medical Record Number: [id number] Patient Account Number: [id number] Date of Birth/Sex: July 25, 1965 (55 y.o. M) Treating RN: Cornell Barman Primary Care Crystian Frith: Vidal Schwalbe Other Clinician: Referring Adel Burch: Vidal Schwalbe Treating Kodie Pick/Extender: Skipper Cliche in Treatment: 6 Active Problems Location of Pain Severity and Description of Pain Patient Has Paino No Site Locations Pain Management and Medication Current Pain Management: Notes Patient denies pain at this time. Electronic Signature(s) Signed: 06/07/2021 6:31:02 PM By:  Gretta Cool, BSN, RN, CWS, Kim RN, BSN Entered By: Gretta Cool, BSN, RN, CWS, Kim on 06/07/2021 09:13:11 Ralph Peterson ([id number]) -------------------------------------------------------------------------------- Patient/Caregiver Education Details Patient Name: Ralph Peterson Date of Service: 06/07/2021 9:00 AM Medical Record Number: [id number] Patient Account Number: [id number] Date of Birth/Gender: [birth year] (55 y.o. M) Treating RN: Cornell Barman Primary Care Physician: Vidal Schwalbe Other Clinician: Referring Physician: Vidal Schwalbe Treating Physician/Extender: Skipper Cliche in Treatment: 6 Education Assessment Education Provided To: Patient Education Topics Provided Infection: Handouts: Infection Prevention and Management Methods: Demonstration, Explain/Verbal Responses: State content correctly Electronic Signature(s) Signed: 06/07/2021 6:31:02 PM By: Gretta Cool, BSN, RN, CWS, Kim RN, BSN Entered By: Gretta Cool, BSN, RN, CWS, Kim on 06/07/2021 09:55:47 Ralph Peterson ([id number]) -------------------------------------------------------------------------------- Wound Assessment Details Patient Name: Ralph Peterson Date of Service: 06/07/2021 9:00 AM Medical Record Number: [id number] Patient Account  Number: [id number] Date of Birth/Sex: 03-03-1966 (55 y.o. M) Treating RN: Cornell Barman Primary Care Williom Cedar: Vidal Schwalbe Other Clinician: Referring Arlester Keehan: Vidal Schwalbe Treating Broly Hatfield/Extender: Skipper Cliche in Treatment: 6 Wound Status Wound Number: 10 Primary Diabetic Wound/Ulcer of the Lower Extremity Etiology: Wound Location: Right, Distal, Plantar Foot Wound Open Wounding Event: Blister Status: Date Acquired: 04/10/2021 Comorbid Arrhythmia, Congestive Heart Failure, Coronary Artery Weeks Of Treatment: 6 History: Disease, Hypertension, Myocardial Infarction, Peripheral Clustered Wound: No Venous Disease, Type II Diabetes,  Neuropathy Photos Wound Measurements Length: (cm) 1.4 Width: (cm) 1.5 Depth: (cm) 0.7 Area: (cm) 1.649 Volume: (cm) 1.155 % Reduction in Area: -162.6% % Reduction in Volume: -816.7% Epithelialization: None Tunneling: No Undermining: Yes Starting Position (o'clock): 1 Ending Position (o'clock): 4 Maximum Distance: (cm) 0.4 Wound Description Classification: Grade 3 Wound Margin: Flat and Intact Exudate Amount: Medium Exudate Type: Serosanguineous Exudate Color: red, brown Foul Odor After Cleansing: No Slough/Fibrino Yes Wound Bed Granulation Amount: Medium (34-66%) Exposed Structure Granulation Quality: Pink, Hyper-granulation Fascia Exposed: No Necrotic Amount: Medium (34-66%) Fat Layer (Subcutaneous Tissue) Exposed: Yes Necrotic Quality: Adherent Slough Tendon Exposed: Yes Muscle Exposed: No Joint Exposed: No Bone Exposed: No Electronic Signature(s) Signed: 06/07/2021 6:31:02 PM By: Gretta Cool, BSN, RN, CWS, Kim RN, BSN Entered By: Gretta Cool, BSN, RN, CWS, Kim on 06/07/2021 09:39:43 Ralph Peterson ([name] ([id number]) -------------------------------------------------------------------------------- Vitals Details Patient Name: Ralph Peterson. Date of Service: 06/07/2021 9:00 AM Medical Record Number: [id number] Patient Account Number: [id number] Date of Birth/Sex: 1965/12/25 (55 y.o. M) Treating RN: Cornell Barman Primary Care Cutler Sunday: Vidal Schwalbe Other Clinician: Referring Araceli Coufal: Vidal Schwalbe Treating Everardo Voris/Extender: Skipper Cliche in Treatment: 6 Vital Signs Time Taken: 09:12 Temperature (F): 98.3 Height (in): 72 Pulse (bpm): 88 Weight (lbs): 219 Respiratory Rate (breaths/min): 16 Body Mass Index (BMI): 29.7 Blood Pressure (mmHg): 135/86 Reference Range: 80 - 120 mg / dl Electronic Signature(s) Signed: 06/07/2021 6:31:02 PM By: Gretta Cool, BSN, RN, CWS, Kim RN, BSN Entered By: Gretta Cool, BSN, RN, CWS, Kim on  06/07/2021 09:12:58

## 2021-06-12 ENCOUNTER — Ambulatory Visit: Payer: Medicare PPO | Admitting: Physician Assistant

## 2021-06-21 ENCOUNTER — Ambulatory Visit: Payer: Medicare PPO | Admitting: Physician Assistant

## 2021-06-21 DEATH — deceased

## 2021-06-28 ENCOUNTER — Ambulatory Visit: Payer: Medicare PPO | Admitting: Physician Assistant

## 2022-05-20 ENCOUNTER — Encounter (INDEPENDENT_AMBULATORY_CARE_PROVIDER_SITE_OTHER): Payer: Self-pay

## 2022-06-25 IMAGING — RF DG SNIFF TEST
4 series · 13 of 13 positions shown · non-contrast
Comparison: 03/07/2020

CLINICAL DATA: Shortness of breath

EXAM:
CHEST FLUOROSCOPY
TECHNIQUE: Real-time fluoroscopic evaluation of the chest was performed.
FLUOROSCOPY TIME:  Fluoroscopy Time:  42 seconds
Radiation Exposure Index (if provided by the fluoroscopic device):
5.4 mGy
Number of Acquired Spot Images: Cine fluoroscopic runs

[Series 1: fluoro_chest_bb · 0.17mm/px · 1 of 1 slices shown]
[im 1/1]
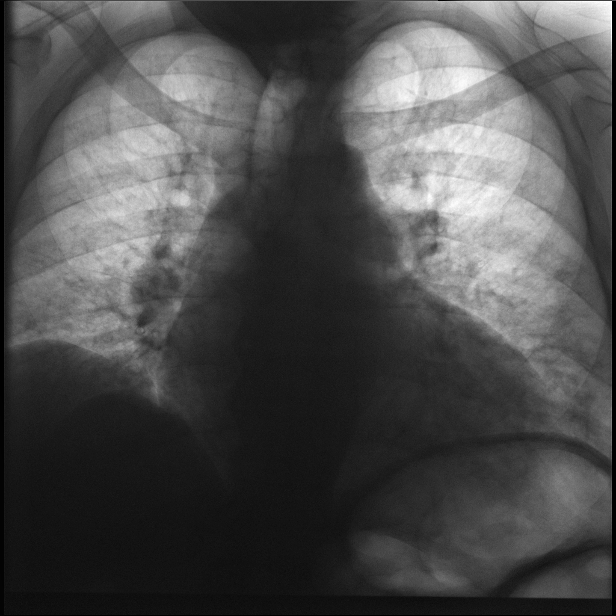

[Series 2: cp_chest · 0.26mm/px · 4 of 99 frames shown (1 of 3)]
[frame 9/99]
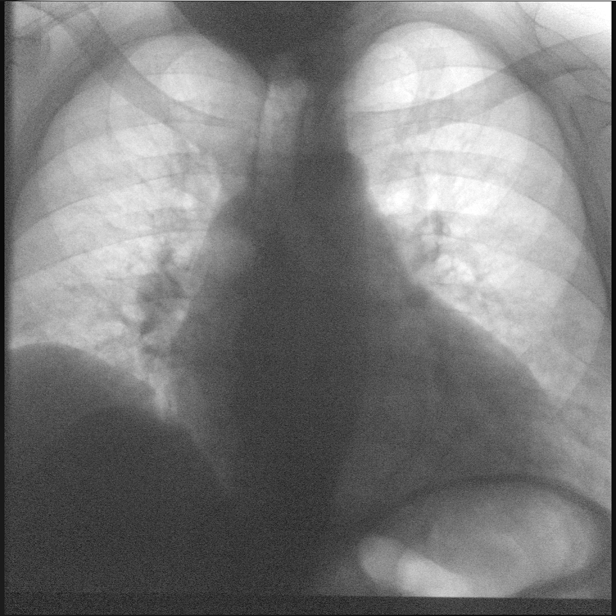
[frame 15/99]
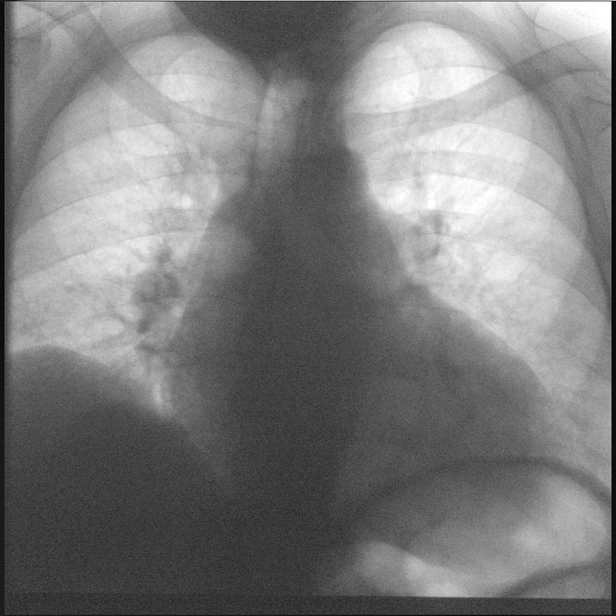
[frame 50/99]
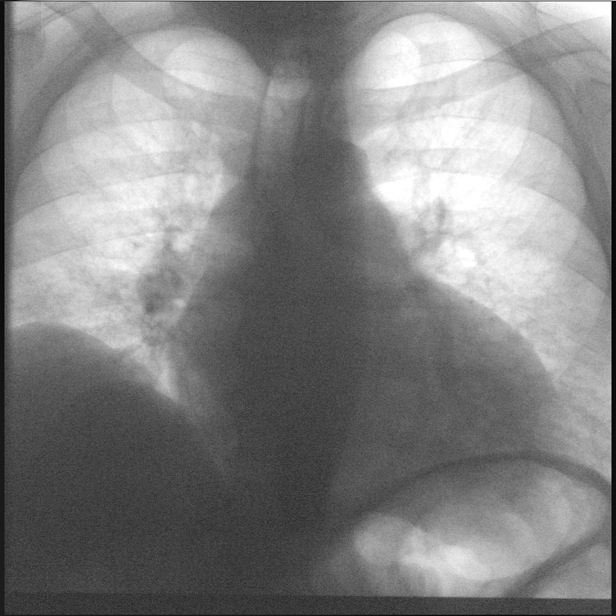
[frame 85/99]
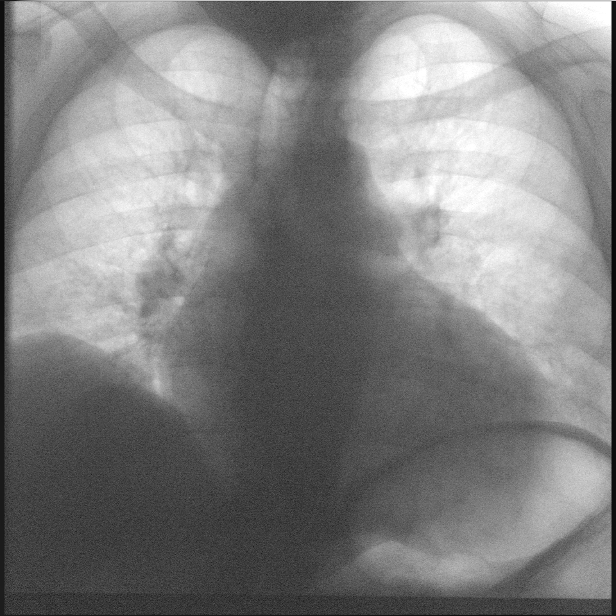

[Series 3: cp_chest · 0.26mm/px · 4 of 114 frames shown (2 of 3)]
[frame 18/114]
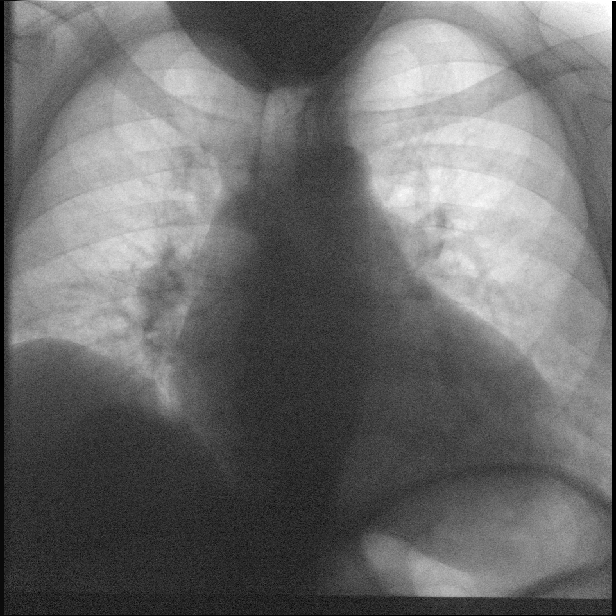
[frame 58/114]
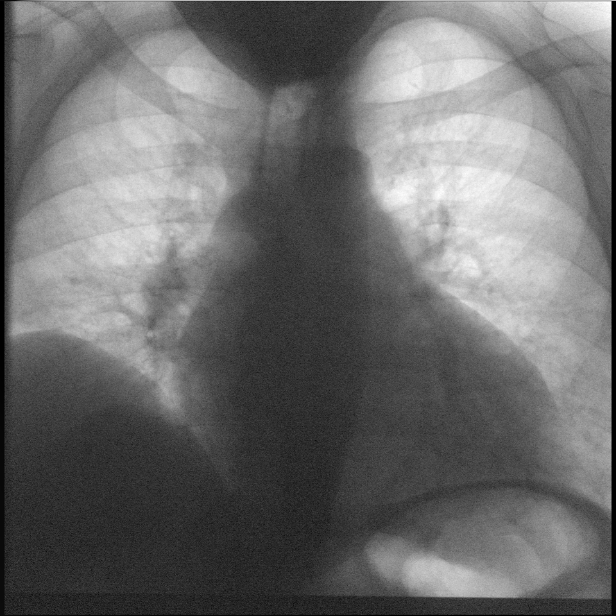
[frame 67/114]
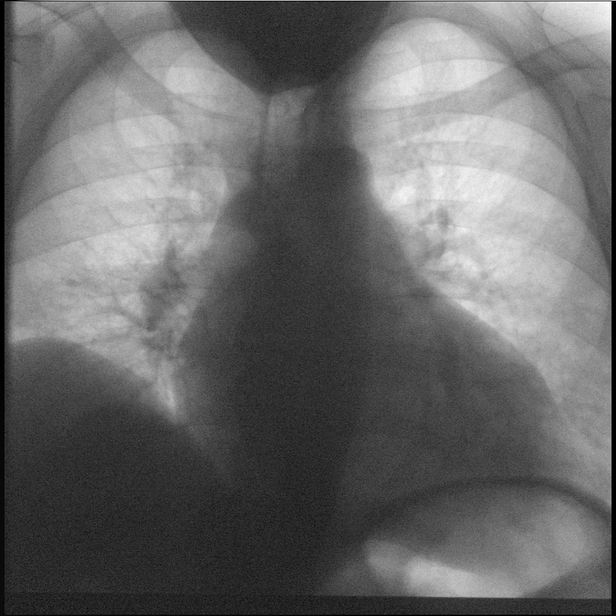
[frame 97/114]
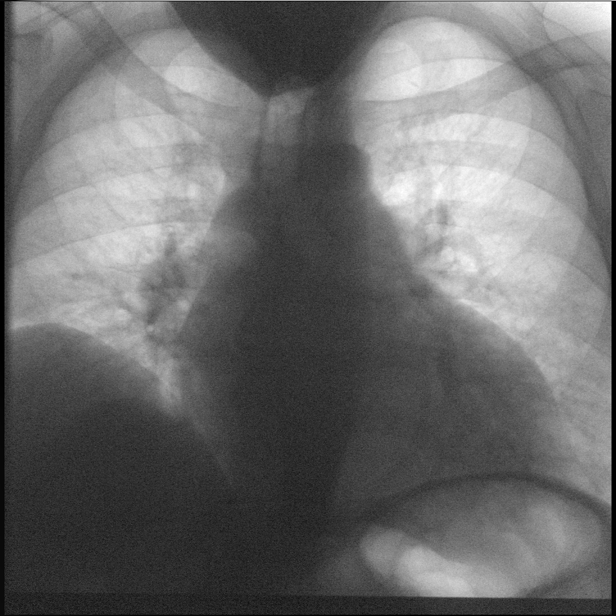

[Series 4: cp_chest · 0.26mm/px · 4 of 107 frames shown (3 of 3)]
[frame 17/107]
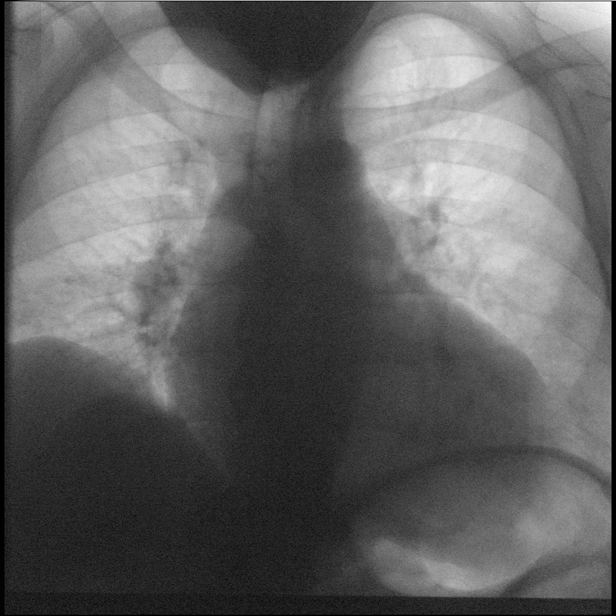
[frame 27/107]
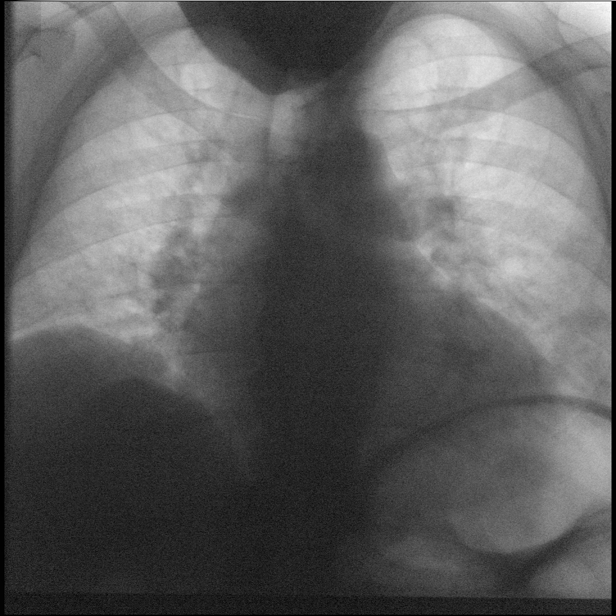
[frame 54/107]
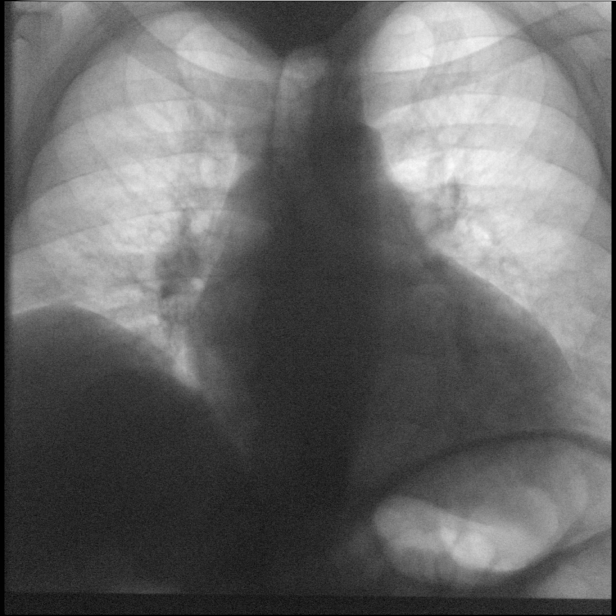
[frame 91/107]
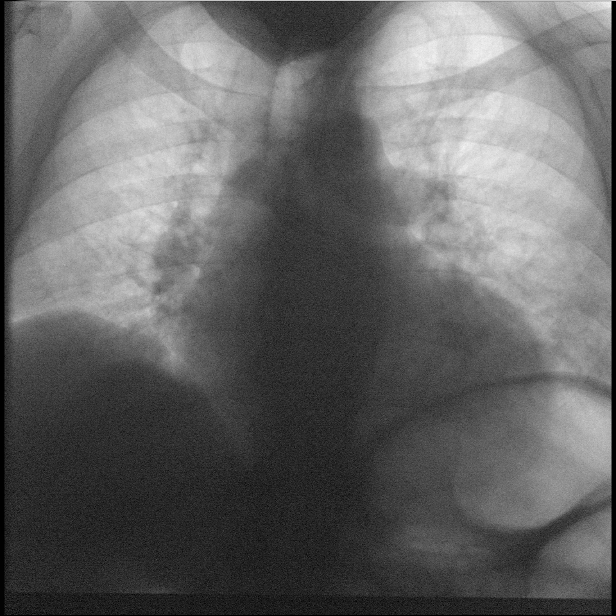

[13 of 13 positions shown; findings below may reference images not displayed]

FINDINGS: Mild elevation of the right hemidiaphragm is noted stable from the
prior examination. Deep inspiration shows normal motion of the left
hemidiaphragm with very minimal motion of the right hemidiaphragm.
Sniff test shows minimal motion on the right with more normal
appearing excursion of the diaphragm on the left.
IMPRESSION: Significant decreased function of the right hemidiaphragm. No
paradoxical motion is noted.

## 2022-07-23 IMAGING — DX DG FOOT COMPLETE 3+V*R*
3 series · 3 of 3 positions shown · non-contrast
Comparison: 04/18/2020

CLINICAL DATA: Wound

EXAM:
RIGHT FOOT COMPLETE - 3+ VIEW

[foot ap]
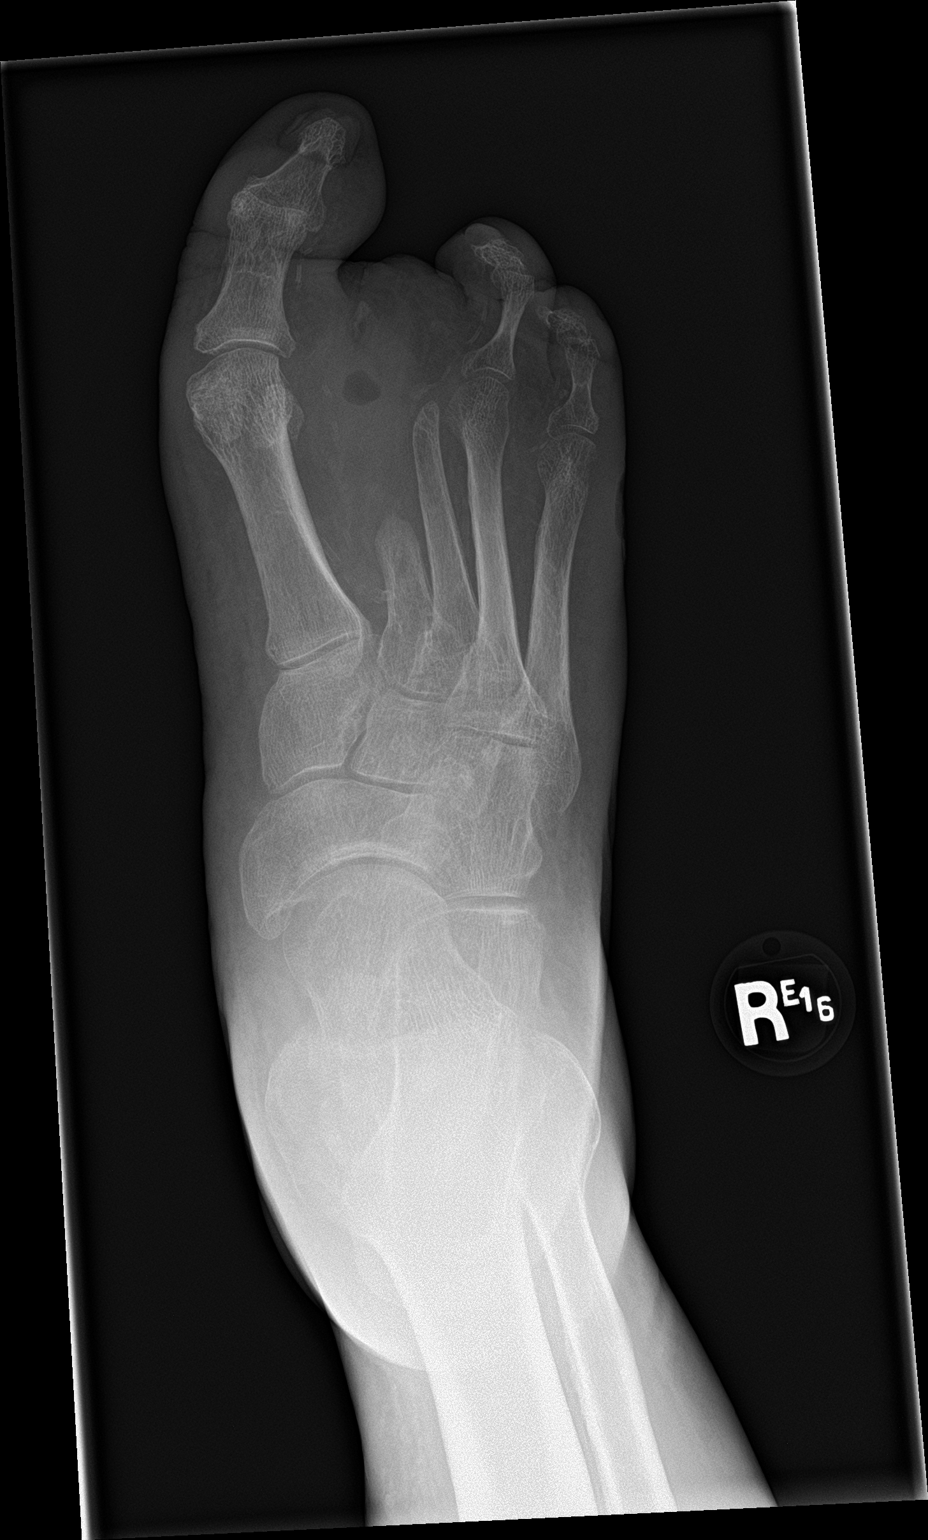

[foot obl]
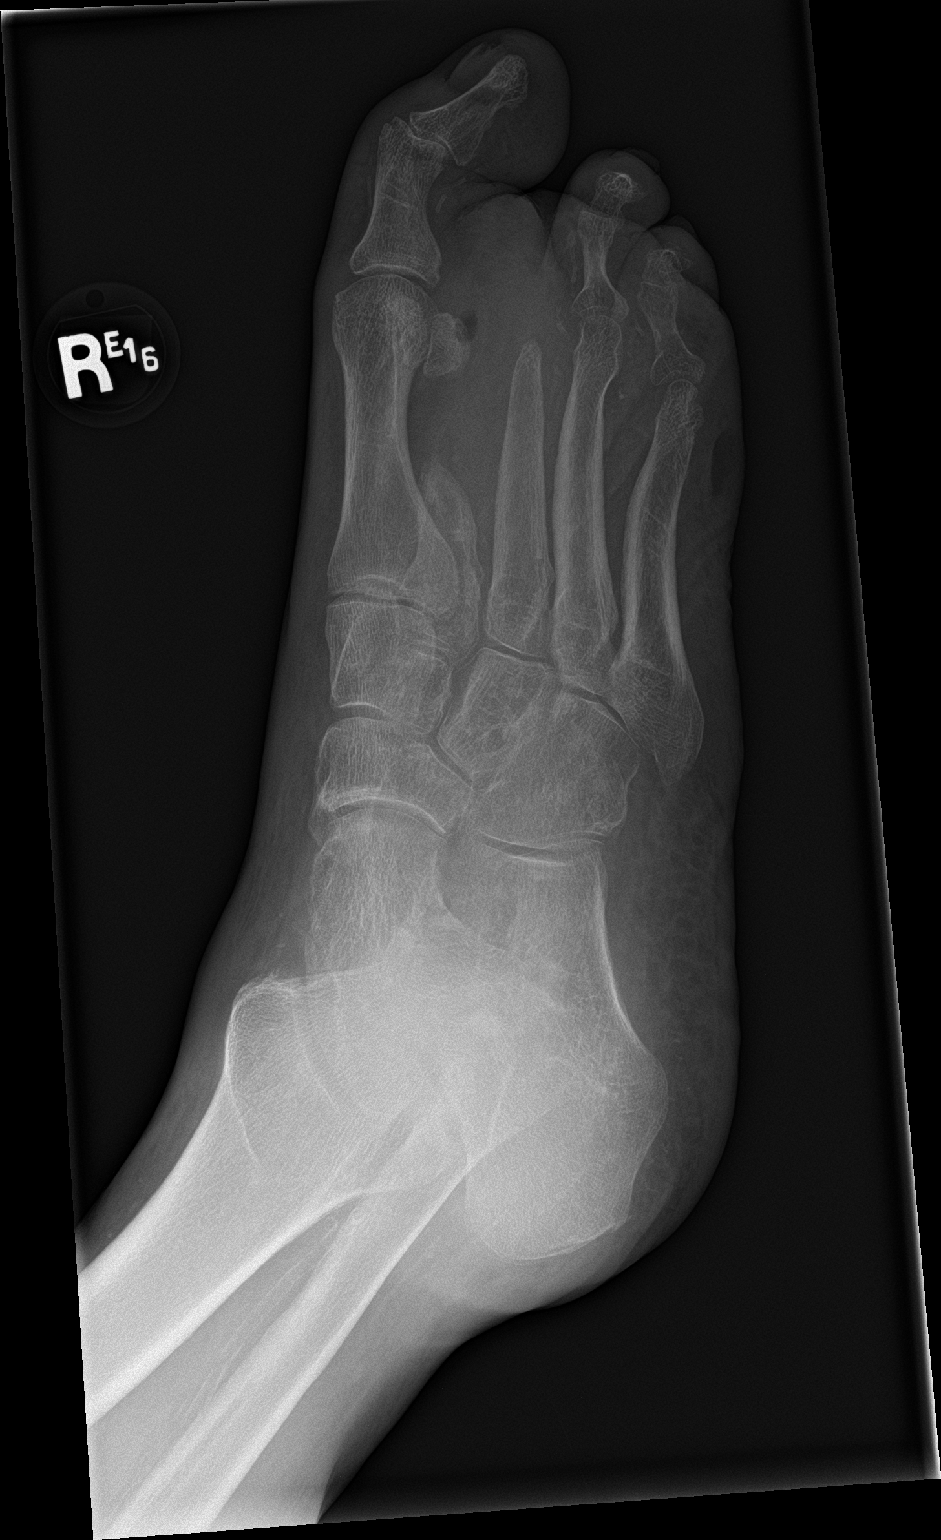

[foot lat]
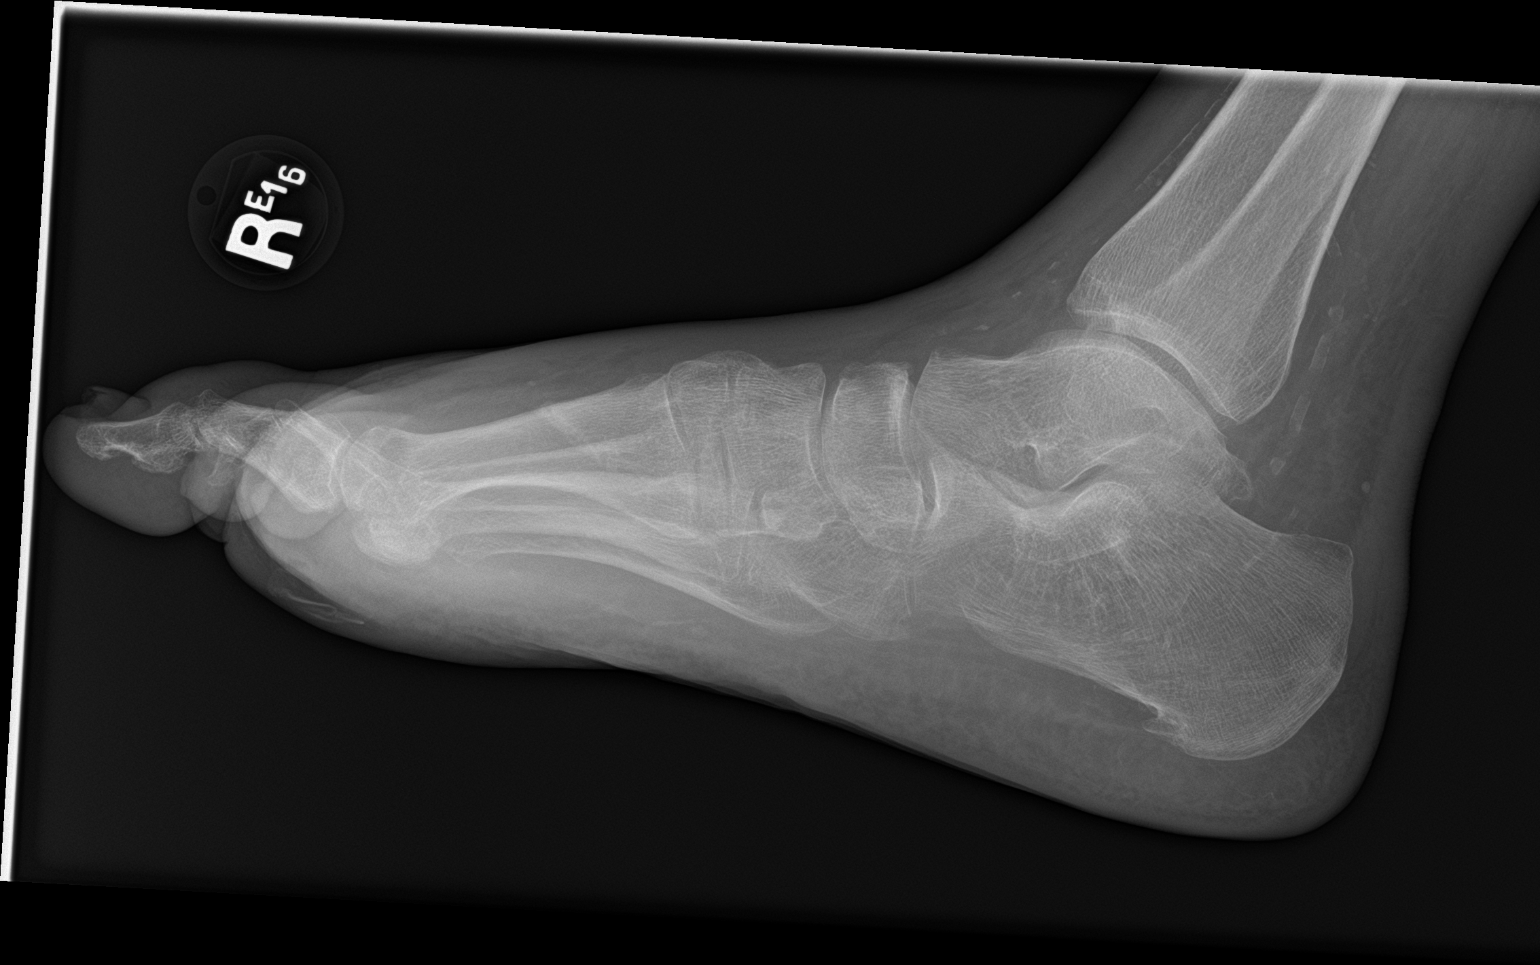

[3 of 3 positions shown; findings below may reference images not displayed]

FINDINGS: Post amputation of second and third digits through the metatarsals.
There are no erosive changes identified. There is lucency overlying
the expected location of the head of the second metatarsal. There is
additional lucency along the lateral aspect of the foot at the level
of the distal fifth metatarsal.
IMPRESSION: Lucency overlying the expected location of the head of the second
metatarsal. This likely reflects soft tissue gas within the abscess
noted on MRI.

Additional lucency along the lateral aspect of the foot at the level
of the distal fifth metatarsal may reflect additional soft tissue
gas.

No erosive changes to suggest osteomyelitis.
